# Patient Record
Sex: Male | Born: 1945 | Race: Black or African American | Hispanic: No
Health system: Southern US, Community
[De-identification: ages and names within clinical notes are randomized; demographics above are authoritative.]

## PROBLEM LIST (undated history)

## (undated) DIAGNOSIS — N289 Disorder of kidney and ureter, unspecified: Secondary | ICD-10-CM

## (undated) DIAGNOSIS — F0280 Dementia in other diseases classified elsewhere without behavioral disturbance: Secondary | ICD-10-CM

## (undated) DIAGNOSIS — I428 Other cardiomyopathies: Secondary | ICD-10-CM

## (undated) DIAGNOSIS — F319 Bipolar disorder, unspecified: Secondary | ICD-10-CM

## (undated) DIAGNOSIS — F0390 Unspecified dementia without behavioral disturbance: Secondary | ICD-10-CM

## (undated) DIAGNOSIS — I10 Essential (primary) hypertension: Secondary | ICD-10-CM

## (undated) DIAGNOSIS — R55 Syncope and collapse: Secondary | ICD-10-CM

## (undated) DIAGNOSIS — E785 Hyperlipidemia, unspecified: Secondary | ICD-10-CM

## (undated) DIAGNOSIS — N189 Chronic kidney disease, unspecified: Secondary | ICD-10-CM

## (undated) HISTORY — DX: Chronic kidney disease, unspecified: N18.9

## (undated) HISTORY — DX: Other cardiomyopathies: I42.8

## (undated) HISTORY — DX: Essential (primary) hypertension: I10

## (undated) HISTORY — DX: Bipolar disorder, unspecified: F31.9

## (undated) HISTORY — DX: Syncope and collapse: R55

## (undated) HISTORY — DX: Disorder of kidney and ureter, unspecified: N28.9

## (undated) HISTORY — DX: Hyperlipidemia, unspecified: E78.5

---

## 2004-10-05 ENCOUNTER — Emergency Department (HOSPITAL_COMMUNITY): Admission: EM | Admit: 2004-10-05 | Discharge: 2004-10-05 | Payer: Self-pay | Admitting: Emergency Medicine

## 2008-08-03 ENCOUNTER — Ambulatory Visit: Payer: Self-pay | Admitting: Internal Medicine

## 2008-08-03 ENCOUNTER — Observation Stay (HOSPITAL_COMMUNITY): Admission: EM | Admit: 2008-08-03 | Discharge: 2008-08-06 | Payer: Self-pay | Admitting: Emergency Medicine

## 2008-08-06 ENCOUNTER — Encounter (INDEPENDENT_AMBULATORY_CARE_PROVIDER_SITE_OTHER): Payer: Self-pay | Admitting: Internal Medicine

## 2008-08-31 DIAGNOSIS — I428 Other cardiomyopathies: Secondary | ICD-10-CM

## 2008-08-31 HISTORY — DX: Other cardiomyopathies: I42.8

## 2008-09-19 ENCOUNTER — Ambulatory Visit (HOSPITAL_COMMUNITY): Admission: RE | Admit: 2008-09-19 | Discharge: 2008-09-19 | Payer: Self-pay | Admitting: Cardiovascular Disease

## 2008-09-19 ENCOUNTER — Encounter (INDEPENDENT_AMBULATORY_CARE_PROVIDER_SITE_OTHER): Payer: Self-pay | Admitting: Cardiology

## 2011-01-16 NOTE — Discharge Summary (Signed)
NAME:  Eric Zamora, Eric Zamora NO.:  [id number]   MEDICAL RECORD NO.:  [id number]          PATIENT TYPE:  OBV   LOCATION:  3709                         FACILITY:  [facility]   PHYSICIAN:  Alvester Morin, M.D.  DATE OF BIRTH:  1946-03-07   DATE OF ADMISSION:  08/03/2008  DATE OF DISCHARGE:  08/06/2008                               DISCHARGE SUMMARY   DISCHARGE DIAGNOSES:  1. Presyncope, likely secondary to dehydration and ACE inhibitor.  2. Hypertension.  3. Bipolar affective disorder.  4. Hyperlipidemia.   DISCHARGE MEDICATIONS:  1. Aspirin 325 mg p.o. daily.  2. Depakote 500 mg in the morning, 500 mg at lunchtime, and 1000 mg in      the evening.  3. Zocor 20 mg p.o. daily.  4. Metoprolol 12.5 mg p.o. b.i.d.   DISPOSITION AND FOLLOWUP:  The patient is to follow up with his primary  care physician, [name]  approximately 2 weeks after discharge.  The patient also has an  appointment to follow up with Dr. [name] in Cardiology on August 20, 2008 at 10:15 a.m.   PROCEDURES PERFORMED:  1. Chest x-ray on August 03, 2008 showed mild bibasilar atelectasis.  2. Myoview on August 05, 2008 revealed decreased perfusion in the mid      inferior wall and stress imaging suspicious for inducible ischemia,      inferior wall hypokinesis with dyskinetic motion of the distal      septum, left ventricular ejection fraction of 48%.  3. A 2-D echocardiogram on August 06, 2008 revealed overall left      ventricular systolic function was normal, left ventricular function      was estimated to be 55-60%, left ventricular wall thickness was      mildly increased, left ventricular diastolic parameters were      normal.  Findings were consistent with mild aortic valve stenosis.      There was also mild-to-moderate mitral annular calcification found.   CONSULTATIONS:  Dr. [name] with Cardiology.   BRIEF ADMITTING HISTORY AND PHYSICAL:  The  patient is a 65 year old  African American male with history of hypertension, bipolar disorder who  presents following a presyncopal episode.  On the day of admission, the  patient was at a store in town with his sister sitting on a bench where  he became acutely dizzy.  He lowered his head between his legs and  experienced resolution of his symptoms approximately 2 minutes later.  He denies any loss of consciousness or prodrome.  The patient denies  headache, visual acuity changes, chest pain, palpitations, or  diaphoresis.  The patient also denies any seizure-like activity.  This  episode was unwitnessed by his sister.  The patient also denies any  bowel or bladder incontinence.  The patient states that due to the  episode was the first time he had experienced any symptoms similar to  this.   VITAL SIGNS IN LABS:  Temperature 98.6, blood pressure 89/57 which  increased to 96/63 after 500 mL of bolus, pulse 86, respirations 20,  saturation 100% on  3 liters.   PHYSICAL EXAMINATION:  GENERAL:  The patient is an elderly male in no  acute distress.  HEENT:  Eyes:  Pupils are equal, round, and reactive to light.  Extraocular movements intact.  ENT:  No trauma, moist mucous membranes.  NECK:  Supple.  No JVD.  No carotid bruits.  RESPIRATIONS:  Clear to auscultation bilaterally.  Good air movement.  No wheezes appreciated.  CARDIOVASCULAR:  Regular rate and rhythm, 2/6 systolic ejection murmur.  GI:  Abdomen is soft, nontender, nondistended, positive bowel sounds.  No organomegaly.  EXTREMITIES:  No clubbing, cyanosis, or edema.  SKIN:  No rashes or lesions.  MUSCULOSKELETAL:  No joint abnormalities.  NEUROLOGIC:  Alert and oriented x3.  Cranial nerves II through XII  intact.  Strength 5/5 bilaterally in upper and lower extremities.  Sensation is intact to light touch.  Deep tendon reflex 2+ throughout.  Toes downgoing bilaterally and coordination is intact.  PSYCHIATRY:  This patient  is somewhat difficult to understand, but he is  appropriate.   LABORATORY INFORMATION:  Sodium 146, potassium 4.7, chloride 115, bicarb  23, BUN 54, creatinine 2.2, glucose 117.  White count 4.9, hemoglobin  13.1, platelets 210.  BNP of 36, D-dimer less than 0.22.  Initial point  of care enzymes are negative.  Chest x-ray which shows mild bibasilar  atelectasis.  PT 14.0, INR 1.1, magnesium 2.3.   HOSPITAL COURSE:  1. Presyncopal episode.  The patient was admitted to the [facility] in order to be evaluated for his presyncopal      episode.  He was placed on telemetry and hydrated appropriately.      Initial orthostatic vital signs revealed the patient to be mildly      orthostatic, thus he received two 500 mL boluses and his pressure      compensated appropriately.  Upon reaching the floor, the patient      had a EKG which revealed first-degree heart block and also      questionable inferior lateral ischemia.  The primary team was      unable to obtain a prior EKG and that it was Friday afternoon and      Dr. Inez Pilgrim office was closed.  His Cardiology was      consulted.  Dr. [name] evaluated the patient and recommended a      Myoview.  In addition, Cardiology recommended holding the patient's      ACE inhibitor and hydrating him appropriately and aspirin was also      started.  The patient did well overnight and throughout his      hospitalization, did not endorse any episodes of chest pain,      diaphoresis, shortness of breath.  The patient did receive his      Myoview on August 05, 2008, which showed decreased perfusion in      the mid inferior wall and stress imaging suspicious for inducible      ischemia.  The patient's cardiac enzymes were cycled x3 and there      was no bump in his CK-MB, myoglobin, or troponin.  Cardiology did      comment that the possible inducible ischemia that was read by      Radiology and the patient's Myoview was possibly  miscalled.  At the      time of dictation, there had not been any revision of the official      read  on the Myoview and the patient is to follow up with Dr. [name]      in his clinic on August 20, 2008 at 10:15 a.m. for further      additional recommendations by Cardiology were for the patient to      increase his p.o. intake and also to discontinue his ACE inhibitor      alright.  2. Renal failure.  The patient's creatinine on admission was 2.2.      Both of the patient and his sister states that he has chronic renal      insufficiency and the primary team attempted to get the patient's      records from Dr. Inez Pilgrim office; however, the patient was      admitted on Friday afternoon and discharged on Monday and they were      unable to get these records.  As mentioned in number 1, the      patient's ACE inhibitor was held on admission and the patient's      creatinine proceeded to trend down from a high of 2.2 to a level of      1.37 at discharge.  Also Cardiology recommended that the patient      discontinue his ACE inhibitor in light of his orthostasis and soft      blood pressures, it would be up to Dr. [name] at time of      hospital followup to determine whether he would like to restart the      patient's ACE inhibitor.  3. Hypertension.  The patient maintained a soft blood pressure      throughout hospitalization with systolics averaging from roughly      100-120 and diastolic averaging 55-75 mmHg.  The patient did have      metoprolol started with parameters 12.5 mg daily.  Further      recommendations regarding the patient's beta-blocker therapy will      be discussed with Dr. [name] at the time of followup.  4. Hyperlipidemia.  As part of the patient's workup, the primary team      obtained a lipid profile, which revealed a cholesterol of 163, the      triglyceride 122, LDL level of 112, and HDL level of 27.  The      primary team proceeded to start Zocor 20  mg daily.  The patient is      being discharged on this medication.  5. Bipolar disorder.  The patient has a long history of taking      Depakote for his bipolar affective disorder.  Valproic acid level      was obtained at admission and the level was found to be therapeutic      at the level of 64.6 mcg/mL.  The primary team restarted his home      dose.  The patient did not have any episodes of mania depression      throughout his hospitalization.  He will be discharged home on his      same level of Depakote.   DISCHARGE LABS AND VITAL SIGNS:  Temperature 97.9, pulse 69, blood  pressure 102/55, respirations 18.  The patient was saturating 99% on  room air.   LABORATORY INFORMATION:  Sodium 142, potassium 4.4, chloride 114, bicarb  27, BUN 14, creatinine 1.37, glucose of 87.  The patient was discharged home in stable and improved condition.      Genia Del, MD  Electronically Signed  Alvester Morin, M.D.  Electronically Signed    ZF/MEDQ  D:  08/07/2008  T:  08/07/2008  Job:  [id number]   cc:   Cristy Hilts. [name], MD

## 2011-06-05 LAB — BASIC METABOLIC PANEL
BUN: 14 mg/dL (ref 6–23)
BUN: 36 mg/dL — ABNORMAL HIGH (ref 6–23)
CO2: 23 mEq/L (ref 19–32)
CO2: 25 mEq/L (ref 19–32)
Calcium: 8 mg/dL — ABNORMAL LOW (ref 8.4–10.5)
Chloride: 113 mEq/L — ABNORMAL HIGH (ref 96–112)
Creatinine, Ser: 1.37 mg/dL (ref 0.4–1.5)
GFR calc Af Amer: 48 mL/min — ABNORMAL LOW (ref >60)
GFR calc non Af Amer: 43 mL/min — ABNORMAL LOW (ref >60)
GFR calc non Af Amer: 52 mL/min — ABNORMAL LOW (ref >60)
Glucose, Bld: 87 mg/dL (ref 70–99)
Glucose, Bld: 87 mg/dL (ref 70–99)
Potassium: 4.8 mEq/L (ref 3.5–5.1)
Sodium: 138 mEq/L (ref 135–145)
Sodium: 142 mEq/L (ref 135–145)
Sodium: 144 mEq/L (ref 135–145)

## 2011-06-05 LAB — HEPATIC FUNCTION PANEL
Albumin: 3.8 g/dL (ref 3.5–5.2)
Bilirubin, Direct: 0.1 mg/dL (ref 0.0–0.3)
Indirect Bilirubin: 0.3 mg/dL (ref 0.3–0.9)
Total Protein: 6.9 g/dL (ref 6.0–8.3)

## 2011-06-05 LAB — URINALYSIS, ROUTINE W REFLEX MICROSCOPIC
Glucose, UA: 100 mg/dL — AB
Urobilinogen, UA: 2 mg/dL — ABNORMAL HIGH (ref 0.0–1.0)

## 2011-06-05 LAB — DIFFERENTIAL
Basophils Absolute: 0 10*3/uL (ref 0.0–0.1)
Eosinophils Absolute: 0 10*3/uL (ref 0.0–0.7)
Eosinophils Relative: 1 % (ref 0–5)
Monocytes Absolute: 0.7 10*3/uL (ref 0.1–1.0)
Neutro Abs: 2 10*3/uL (ref 1.7–7.7)

## 2011-06-05 LAB — CBC
HCT: 37.8 % — ABNORMAL LOW (ref 39.0–52.0)
MCV: 94.9 fL (ref 78.0–100.0)
Platelets: 210 10*3/uL (ref 150–400)

## 2011-06-05 LAB — DRUGS OF ABUSE SCREEN W/O ALC, ROUTINE URINE
Barbiturate Quant, Ur: NEGATIVE
Benzodiazepines.: NEGATIVE
Cocaine Metabolites: NEGATIVE
Creatinine,U: 121.9 mg/dL
Methadone: NEGATIVE
Opiate Screen, Urine: NEGATIVE
Phencyclidine (PCP): NEGATIVE

## 2011-06-05 LAB — CARDIAC PANEL(CRET KIN+CKTOT+MB+TROPI)
CK, MB: 3.5 ng/mL (ref 0.3–4.0)
Relative Index: INVALID (ref 0.0–2.5)
Relative Index: INVALID (ref 0.0–2.5)
Total CK: 110 U/L (ref 7–232)
Total CK: 95 U/L (ref 7–232)
Total CK: 95 U/L (ref 7–232)
Troponin I: 0.01 ng/mL (ref 0.00–0.06)

## 2011-06-05 LAB — HEPATITIS PANEL, ACUTE
HCV Ab: NEGATIVE
Hep A IgM: NEGATIVE

## 2011-06-05 LAB — TSH: TSH: 1.554 u[IU]/mL (ref 0.350–4.500)

## 2011-06-05 LAB — LIPID PANEL
HDL: 27 mg/dL — ABNORMAL LOW (ref >39)
Triglycerides: 122 mg/dL (ref <150)
VLDL: 24 mg/dL (ref 0–40)

## 2011-06-05 LAB — POCT I-STAT, CHEM 8
BUN: 51 mg/dL — ABNORMAL HIGH (ref 6–23)
Calcium, Ion: 1.17 mmol/L (ref 1.12–1.32)
Chloride: 115 mEq/L — ABNORMAL HIGH (ref 96–112)
HCT: 38 % — ABNORMAL LOW (ref 39.0–52.0)

## 2011-06-05 LAB — D-DIMER, QUANTITATIVE: D-Dimer, Quant: <0.22 ug/mL-FEU (ref 0.00–0.48)

## 2011-06-05 LAB — SODIUM, URINE, RANDOM: Sodium, Ur: 160 mEq/L

## 2011-06-05 LAB — POCT CARDIAC MARKERS: Myoglobin, poc: 121 ng/mL (ref 12–200)

## 2011-06-05 LAB — TROPONIN I: Troponin I: 0.01 ng/mL (ref 0.00–0.06)

## 2011-06-05 LAB — MAGNESIUM: Magnesium: 2.3 mg/dL (ref 1.5–2.5)

## 2011-06-05 LAB — CK TOTAL AND CKMB (NOT AT ARMC)
CK, MB: 3 ng/mL (ref 0.3–4.0)
Total CK: 108 U/L (ref 7–232)

## 2012-11-08 ENCOUNTER — Encounter: Payer: Self-pay | Admitting: Cardiology

## 2012-11-24 ENCOUNTER — Encounter: Payer: Self-pay | Admitting: Cardiovascular Disease

## 2013-07-02 ENCOUNTER — Emergency Department (HOSPITAL_COMMUNITY): Payer: Medicare Other

## 2013-07-02 ENCOUNTER — Encounter (HOSPITAL_COMMUNITY): Payer: Self-pay | Admitting: Emergency Medicine

## 2013-07-02 ENCOUNTER — Ambulatory Visit (HOSPITAL_COMMUNITY)
Admission: RE | Admit: 2013-07-02 | Discharge: 2013-07-02 | Disposition: A | Discharge status: Home / Self Care | Payer: Medicare Other | Source: Ambulatory Visit | Attending: Pulmonary Disease | Admitting: Pulmonary Disease

## 2013-07-02 ENCOUNTER — Inpatient Hospital Stay (HOSPITAL_COMMUNITY)
Admission: EM | Admit: 2013-07-02 | Discharge: 2013-07-10 | DRG: 871 | Disposition: A | Discharge status: Skilled Nursing Facility | Payer: Medicare Other | Attending: Internal Medicine | Admitting: Internal Medicine

## 2013-07-02 DIAGNOSIS — T839XXA Unspecified complication of genitourinary prosthetic device, implant and graft, initial encounter: Secondary | ICD-10-CM

## 2013-07-02 DIAGNOSIS — I2789 Other specified pulmonary heart diseases: Secondary | ICD-10-CM | POA: Diagnosis present

## 2013-07-02 DIAGNOSIS — I10 Essential (primary) hypertension: Secondary | ICD-10-CM | POA: Diagnosis present

## 2013-07-02 DIAGNOSIS — I5020 Unspecified systolic (congestive) heart failure: Secondary | ICD-10-CM | POA: Diagnosis present

## 2013-07-02 DIAGNOSIS — F319 Bipolar disorder, unspecified: Secondary | ICD-10-CM

## 2013-07-02 DIAGNOSIS — I2510 Atherosclerotic heart disease of native coronary artery without angina pectoris: Secondary | ICD-10-CM

## 2013-07-02 DIAGNOSIS — R7989 Other specified abnormal findings of blood chemistry: Secondary | ICD-10-CM

## 2013-07-02 DIAGNOSIS — N289 Disorder of kidney and ureter, unspecified: Secondary | ICD-10-CM

## 2013-07-02 DIAGNOSIS — G9340 Encephalopathy, unspecified: Secondary | ICD-10-CM | POA: Diagnosis present

## 2013-07-02 DIAGNOSIS — R6520 Severe sepsis without septic shock: Secondary | ICD-10-CM | POA: Diagnosis present

## 2013-07-02 DIAGNOSIS — F0390 Unspecified dementia without behavioral disturbance: Secondary | ICD-10-CM | POA: Diagnosis present

## 2013-07-02 DIAGNOSIS — I824Z9 Acute embolism and thrombosis of unspecified deep veins of unspecified distal lower extremity: Secondary | ICD-10-CM | POA: Diagnosis present

## 2013-07-02 DIAGNOSIS — E785 Hyperlipidemia, unspecified: Secondary | ICD-10-CM

## 2013-07-02 DIAGNOSIS — N189 Chronic kidney disease, unspecified: Secondary | ICD-10-CM | POA: Diagnosis present

## 2013-07-02 DIAGNOSIS — I129 Hypertensive chronic kidney disease with stage 1 through stage 4 chronic kidney disease, or unspecified chronic kidney disease: Secondary | ICD-10-CM | POA: Diagnosis present

## 2013-07-02 DIAGNOSIS — I509 Heart failure, unspecified: Secondary | ICD-10-CM | POA: Diagnosis present

## 2013-07-02 DIAGNOSIS — F312 Bipolar disorder, current episode manic severe with psychotic features: Secondary | ICD-10-CM

## 2013-07-02 DIAGNOSIS — Z66 Do not resuscitate: Secondary | ICD-10-CM | POA: Diagnosis present

## 2013-07-02 DIAGNOSIS — I421 Obstructive hypertrophic cardiomyopathy: Secondary | ICD-10-CM

## 2013-07-02 DIAGNOSIS — N179 Acute kidney failure, unspecified: Secondary | ICD-10-CM | POA: Diagnosis present

## 2013-07-02 DIAGNOSIS — I82402 Acute embolism and thrombosis of unspecified deep veins of left lower extremity: Secondary | ICD-10-CM

## 2013-07-02 DIAGNOSIS — J189 Pneumonia, unspecified organism: Secondary | ICD-10-CM

## 2013-07-02 DIAGNOSIS — E876 Hypokalemia: Secondary | ICD-10-CM

## 2013-07-02 DIAGNOSIS — A419 Sepsis, unspecified organism: Principal | ICD-10-CM

## 2013-07-02 DIAGNOSIS — K560 Paralytic ileus: Secondary | ICD-10-CM

## 2013-07-02 DIAGNOSIS — I214 Non-ST elevation (NSTEMI) myocardial infarction: Secondary | ICD-10-CM | POA: Diagnosis present

## 2013-07-02 DIAGNOSIS — R931 Abnormal findings on diagnostic imaging of heart and coronary circulation: Secondary | ICD-10-CM

## 2013-07-02 DIAGNOSIS — J9600 Acute respiratory failure, unspecified whether with hypoxia or hypercapnia: Secondary | ICD-10-CM

## 2013-07-02 DIAGNOSIS — I428 Other cardiomyopathies: Secondary | ICD-10-CM | POA: Diagnosis present

## 2013-07-02 HISTORY — DX: Unspecified dementia, unspecified severity, without behavioral disturbance, psychotic disturbance, mood disturbance, and anxiety: F03.90

## 2013-07-02 LAB — BASIC METABOLIC PANEL
BUN: 32 mg/dL — ABNORMAL HIGH (ref 6–23)
Calcium: 8.4 mg/dL (ref 8.4–10.5)
Calcium: 8.3 mg/dL — ABNORMAL LOW (ref 8.4–10.5)
Creatinine, Ser: 1.91 mg/dL — ABNORMAL HIGH (ref 0.50–1.35)
GFR calc Af Amer: 45 mL/min — ABNORMAL LOW (ref >90)
GFR calc Af Amer: 40 mL/min — ABNORMAL LOW (ref >90)
GFR calc non Af Amer: 39 mL/min — ABNORMAL LOW (ref >90)
GFR calc non Af Amer: 34 mL/min — ABNORMAL LOW (ref >90)
Glucose, Bld: 155 mg/dL — ABNORMAL HIGH (ref 70–99)
Glucose, Bld: 132 mg/dL — ABNORMAL HIGH (ref 70–99)
Potassium: 2.1 mEq/L — CL (ref 3.5–5.1)
Sodium: 142 mEq/L (ref 135–145)
Sodium: 143 mEq/L (ref 135–145)

## 2013-07-02 LAB — CBC WITH DIFFERENTIAL/PLATELET
Basophils Absolute: 0 10*3/uL (ref 0.0–0.1)
Basophils Relative: 0 % (ref 0–1)
Eosinophils Absolute: 0 10*3/uL (ref 0.0–0.7)
Hemoglobin: 13.4 g/dL (ref 13.0–17.0)
MCH: 30.4 pg (ref 26.0–34.0)
MCHC: 36.4 g/dL — ABNORMAL HIGH (ref 30.0–36.0)
Monocytes Relative: 10 % (ref 3–12)
Neutro Abs: 9.3 10*3/uL — ABNORMAL HIGH (ref 1.7–7.7)
Neutrophils Relative %: 84 % — ABNORMAL HIGH (ref 43–77)
RDW: 13.4 % (ref 11.5–15.5)

## 2013-07-02 LAB — GLUCOSE, CAPILLARY: Glucose-Capillary: 131 mg/dL — ABNORMAL HIGH (ref 70–99)

## 2013-07-02 LAB — MRSA PCR SCREENING: MRSA by PCR: NEGATIVE

## 2013-07-02 LAB — TROPONIN I: Troponin I: 6.06 ng/mL (ref <0.30)

## 2013-07-02 MED ORDER — ALBUTEROL SULFATE (5 MG/ML) 0.5% IN NEBU: 2.5000 mg | INHALATION_SOLUTION | RESPIRATORY_TRACT | Status: DC | PRN

## 2013-07-02 MED ORDER — ASPIRIN 81 MG PO CHEW
324.0000 mg | CHEWABLE_TABLET | Freq: Once | ORAL | Status: AC
  Administered 2013-07-02: 324 mg via ORAL
  Filled 2013-07-02: qty 4

## 2013-07-02 MED ORDER — METOPROLOL TARTRATE 25 MG PO TABS: 12.5000 mg | ORAL_TABLET | Freq: Two times a day (BID) | ORAL | Status: DC

## 2013-07-02 MED ORDER — ASPIRIN 81 MG PO CHEW: 81.0000 mg | CHEWABLE_TABLET | Freq: Every day | ORAL | Status: DC

## 2013-07-02 MED ORDER — PANTOPRAZOLE SODIUM 40 MG PO TBEC
40.0000 mg | DELAYED_RELEASE_TABLET | Freq: Every day | ORAL | Status: DC
  Administered 2013-07-03: 40 mg via ORAL
  Filled 2013-07-02: qty 1

## 2013-07-02 MED ORDER — DEXTROSE 5 % IV SOLN
500.0000 mg | INTRAVENOUS | Status: DC
  Administered 2013-07-02: 500 mg via INTRAVENOUS
  Filled 2013-07-02 (×2): qty 500

## 2013-07-02 MED ORDER — HEPARIN (PORCINE) IN NACL 100-0.45 UNIT/ML-% IJ SOLN
900.0000 [IU]/h | INTRAMUSCULAR | Status: DC
Start: 2013-07-02 — End: 2013-07-02
  Filled 2013-07-02: qty 250

## 2013-07-02 MED ORDER — DEXTROSE 5 % IV SOLN: 500.0000 mg | INTRAVENOUS | Status: DC

## 2013-07-02 MED ORDER — HEPARIN BOLUS VIA INFUSION
4000.0000 [IU] | Freq: Once | INTRAVENOUS | Status: AC
  Administered 2013-07-02: 4000 [IU] via INTRAVENOUS
  Filled 2013-07-02: qty 4000

## 2013-07-02 MED ORDER — HEPARIN (PORCINE) IN NACL 100-0.45 UNIT/ML-% IJ SOLN
900.0000 [IU]/h | INTRAMUSCULAR | Status: DC
  Administered 2013-07-02: 900 [IU]/h via INTRAVENOUS
  Filled 2013-07-02 (×2): qty 250

## 2013-07-02 MED ORDER — MORPHINE SULFATE 2 MG/ML IJ SOLN
1.0000 mg | Freq: Once | INTRAMUSCULAR | Status: AC
  Administered 2013-07-02: 1 mg via INTRAVENOUS
  Filled 2013-07-02: qty 1

## 2013-07-02 MED ORDER — PIPERACILLIN-TAZOBACTAM 3.375 G IVPB 30 MIN
3.3750 g | Freq: Once | INTRAVENOUS | Status: AC
  Administered 2013-07-02: 3.375 g via INTRAVENOUS
  Filled 2013-07-02: qty 50

## 2013-07-02 MED ORDER — METOPROLOL TARTRATE 12.5 MG HALF TABLET
12.5000 mg | ORAL_TABLET | Freq: Two times a day (BID) | ORAL | Status: DC
  Administered 2013-07-02 – 2013-07-04 (×5): 12.5 mg via ORAL
  Filled 2013-07-02 (×7): qty 1

## 2013-07-02 MED ORDER — POTASSIUM CHLORIDE 10 MEQ/100ML IV SOLN
10.0000 meq | INTRAVENOUS | Status: AC
Start: 2013-07-02 — End: 2013-07-02
  Administered 2013-07-02 (×4): 10 meq via INTRAVENOUS
  Filled 2013-07-02 (×2): qty 100

## 2013-07-02 MED ORDER — FUROSEMIDE 10 MG/ML IJ SOLN
40.0000 mg | Freq: Once | INTRAMUSCULAR | Status: AC
  Administered 2013-07-02: 40 mg via INTRAVENOUS
  Filled 2013-07-02: qty 4

## 2013-07-02 MED ORDER — PANTOPRAZOLE SODIUM 40 MG PO TBEC: 40.0000 mg | DELAYED_RELEASE_TABLET | Freq: Every day | ORAL | Status: DC

## 2013-07-02 MED ORDER — ASPIRIN 81 MG PO CHEW
81.0000 mg | CHEWABLE_TABLET | Freq: Every day | ORAL | Status: DC
  Administered 2013-07-03 – 2013-07-10 (×8): 81 mg via ORAL
  Filled 2013-07-02 (×7): qty 1

## 2013-07-02 MED ORDER — SODIUM CHLORIDE 0.9 % IV BOLUS (SEPSIS)
1000.0000 mL | Freq: Once | INTRAVENOUS | Status: AC
  Administered 2013-07-02: 1000 mL via INTRAVENOUS

## 2013-07-02 MED ORDER — POTASSIUM CHLORIDE 10 MEQ/100ML IV SOLN
10.0000 meq | Freq: Once | INTRAVENOUS | Status: AC
  Administered 2013-07-02: 10 meq via INTRAVENOUS
  Filled 2013-07-02: qty 100

## 2013-07-02 MED ORDER — POTASSIUM CHLORIDE CRYS ER 20 MEQ PO TBCR
40.0000 meq | EXTENDED_RELEASE_TABLET | Freq: Once | ORAL | Status: AC
  Administered 2013-07-02: 40 meq via ORAL
  Filled 2013-07-02: qty 2

## 2013-07-02 MED ORDER — HEPARIN BOLUS VIA INFUSION
4000.0000 [IU] | Freq: Once | INTRAVENOUS | Status: DC
  Filled 2013-07-02: qty 4000

## 2013-07-02 MED ORDER — POTASSIUM CHLORIDE 10 MEQ/100ML IV SOLN: 10.0000 meq | INTRAVENOUS | Status: DC

## 2013-07-02 NOTE — Consult Note (Signed)
Pt. Seen and examined. Agree with the NP/PA-C note as written.  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 [name]) and syncope in teh past. He has HTN and LVH.  He also has a history of bipolar disorder and CKD.  He now presents with fever, cough and findings compatible with possible Pneumonia versus CHF on CXR. Initial troponin is elevated at 6 with BNP >1800.  Exam suggests distended neck veins, a protuberant and tense abdomen with high-pitched bowel sounds, no clear cardiac murmur, decreased breath sounds on the left - however, he was on bipap.  I reviewed his EKG which was initially thought to be STEMI, however, there is LVH and clear lateral ST segment depressions, but elevation only in AVR and V1, therefore, not a STEMI.  Impression: 1.  NSTEMI 2.  Acute systolic congestive heart failure, unknown ejection fraction 3.  History of HOCM with a 46 mmHg peak LVOT gradient. 4.  Possible PNA versus CHF - however, there are s/s of infection. 5.  Acute renal failure 6.  Marked hypokalemia  Plan: 1.  Eric Zamora is critically ill, now requiring bipap. He appears to have had an NSTEMI, but denies chest pain.  There is possible pneumonia, but clear signs of heart failure.  I agree with diuresis and medical management of NSTEMI at this time.  Will Rx with aspirin, heparin and b-blocker. Avoid nitrates due to concern for early sepsis and history of HOCM which may be preload dependent. Check 2D echo once he is more clinically compensated. Will ultimately need cardiac catheterization after he is more medically stabilized and if he creatinine improves.  Thanks for consulting [place]. We will follow closely.  Chrystie Nose, MD, [facility] Attending Cardiologist [facility]

## 2013-07-02 NOTE — Consult Note (Signed)
Reason for Consult: Abnormal EKG, elevated Troponin  Requesting Physician: ER  HPI:           This is a 67 y.o. male with a past medical history significant for HOCM- he had a TEE Jan 2010 after a syncopal spell in Dec. 2009. He has a history of HTN with moderate LVH and a peak gradient of 46 mmHg. Nuclear Dec 2009 was initially suspicious for mid inferior ischemia, but was later read by the cardiologist and felt to show no ischemia. Other problems include CRI and Bipolar disorder. He had been followed by Dr Bascom Levels.            He was admitted to the ER today febrile with diarrhea and cough. When I examined him he was in respiratory distress, about to be put on Bi-pap. EKG shows LVH, sinus tach. His POC Troponin was negative but his second Troponin was 6. He denied chest pain to me. He last saw Dr Allyson Sabal 2012.  PMHx:  Past Medical History  Diagnosis Date  . HTN (hypertension)   . 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 Dyslipidemia   . Bipolar affective disorder   . Chronic renal insufficiency    History reviewed. No pertinent past surgical history.  FAMHx: unable to obtain due to pt factors at this time   SOCHx:  reports that he has never smoked. He does not have any smokeless tobacco history on file. He reports that he does not drink alcohol. His drug history is not on file.  ALLERGIES: No Known Allergies  ROS: Review of systems not obtained due to patient factors.  HOME MEDICATIONS:  (Not in a hospital admission)  HOSPITAL MEDICATIONS: I have reviewed the patient's current medications.  VITALS: Blood pressure 138/78, pulse 109, temperature 99.5 F (37.5 C), temperature source Oral, resp. rate 36, height 5\' 5"  (1.651 m), weight 162 lb (73.483 kg), SpO2 95.00%.  PHYSICAL EXAM: General appearance: alert, cooperative, moderate distress and diaphoretic Neck: JVD Lungs: diffuse wheezing, rales Heart: regular rate and rhythm Abdomen: protuberant,  tense, few bowel sounds Extremities: no edema Pulses: diminnished Skin: cool, diaphoretic Neurologic: Grossly normal  LABS: Results for orders placed during the hospital encounter of 07/02/13 (from the past 48 hour(s))  CBC WITH DIFFERENTIAL     Status: Abnormal   Collection Time    07/02/13 11:24 AM      Result Value Range   WBC 11.0 (*) 4.0 - 10.5 K/uL   RBC 4.41  4.22 - 5.81 MIL/uL   Hemoglobin 13.4  13.0 - 17.0 g/dL   HCT 40.9 (*) 81.1 - 91.4 %   MCV 83.4  78.0 - 100.0 fL   MCH 30.4  26.0 - 34.0 pg   MCHC 36.4 (*) 30.0 - 36.0 g/dL   RDW 78.2  95.6 - 21.3 %   Platelets 185  150 - 400 K/uL   Neutrophils Relative % 84 (*) 43 - 77 %   Neutro Abs 9.3 (*) 1.7 - 7.7 K/uL   Lymphocytes Relative 6 (*) 12 - 46 %   Lymphs Abs 0.7  0.7 - 4.0 K/uL   Monocytes Relative 10  3 - 12 %   Monocytes Absolute 1.1 (*) 0.1 - 1.0 K/uL   Eosinophils Relative 0  0 - 5 %   Eosinophils Absolute 0.0  0.0 - 0.7 K/uL   Basophils Relative 0  0 - 1 %   Basophils Absolute 0.0  0.0 - 0.1 K/uL  BASIC METABOLIC PANEL     Status: Abnormal   Collection Time    07/02/13 11:24 AM      Result Value Range   Sodium 142  135 - 145 mEq/L   Potassium 2.4 (*) 3.5 - 5.1 mEq/L   Comment: CRITICAL RESULT CALLED TO, READ BACK BY AND VERIFIED WITH:     MCKEOWNARN [id number] MCCAULEG   Chloride 107  96 - 112 mEq/L   CO2 20  19 - 32 mEq/L   Glucose, Bld 155 (*) 70 - 99 mg/dL   BUN 32 (*) 6 - 23 mg/dL   Creatinine, Ser 4.40 (*) 0.50 - 1.35 mg/dL   Calcium 8.4  8.4 - 34.7 mg/dL   GFR calc non Af Amer 39 (*) >90 mL/min   GFR calc Af Amer 45 (*) >90 mL/min   Comment: (NOTE)     The eGFR has been calculated using the CKD EPI equation.     This calculation has not been validated in all clinical situations.     eGFR's persistently <90 mL/min signify possible Chronic Kidney     Disease.  PRO B NATRIURETIC PEPTIDE     Status: Abnormal   Collection Time    07/02/13 11:25 AM      Result Value Range   Pro B Natriuretic  peptide (BNP) 18591.0 (*) 0 - 125 pg/mL  TROPONIN I     Status: Abnormal   Collection Time    07/02/13 11:25 AM      Result Value Range   Troponin I 6.06 (*) <0.30 ng/mL   Comment:            Due to the release kinetics of cTnI,     a negative result within the first hours     of the onset of symptoms does not rule out     myocardial infarction with certainty.     If myocardial infarction is still suspected,     repeat the test at appropriate intervals.     REPEATED TO VERIFY     CRITICAL RESULT CALLED TO, READ BACK BY AND VERIFIED WITH:     A.MCKEOWN,RN 1259 07/02/13 M.CAMPBELL  LACTIC ACID, PLASMA     Status: None   Collection Time    07/02/13 12:26 PM      Result Value Range   Lactic Acid, Venous 2.1  0.5 - 2.2 mmol/L  MAGNESIUM     Status: None   Collection Time    07/02/13 12:26 PM      Result Value Range   Magnesium 2.2  1.5 - 2.5 mg/dL    EKG: NSR, ST, LVH with repol  IMAGING: Dg Chest Port 1 View  07/02/2013   CLINICAL DATA:  Shortness of breath. Cough, congestion.  EXAM: PORTABLE CHEST - 1 VIEW  COMPARISON:  08/03/2008  FINDINGS: Heart size is normal. There is significant left lung infiltrate, implying the upper and lower lobes. Minimal right lower lobe atelectasis identified. No pulmonary edema.  IMPRESSION: Left upper and lower lobe infiltrates.   Electronically Signed   By: Rosalie Gums M.D.   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 He does not look like an urgent cath candidate at this time. Consider Nitrates and low dose cardio selective beta blocker.  Echo when more stable.  Time Spent Directly with  Patient: 45 minutes  Abelino Derrick [phone] beeper 07/02/2013, 2:16 PM

## 2013-07-02 NOTE — ED Notes (Signed)
Radiology at bedside

## 2013-07-02 NOTE — H&P (Signed)
PULMONARY  / CRITICAL CARE MEDICINE  Name: Eric Zamora MRN: [id number] DOB: Dec 14, 1945    ADMISSION DATE:  07/02/2013 CONSULTATION DATE:  07/02/2013  REFERRING MD :  Jodi Mourning PRIMARY SERVICE: PCCM  CHIEF COMPLAINT:  Shortness of breath  BRIEF PATIENT DESCRIPTION: 67 y/o male with dementia and HOCM presented to the [facility] ED on 11/2 with several days of dyspnea.  Noted to have an NSTEMI, severe sepsis, pneumonia vs acute CHF exacerbation, and an acute abdomen.  SIGNIFICANT EVENTS / STUDIES:  11/2 CT abdomen>  LINES / TUBES:   CULTURES: 11/2 blood >> 11/2 urine >>  ANTIBIOTICS: 11/2 zosyn >>  HISTORY OF PRESENT ILLNESS:  67 y/o male with dementia and HOCM presented to the [facility] ED on 11/2 with several days of dyspnea.  Noted to have an NSTEMI, severe sepsis, pneumonia vs acute CHF exacerbation, and an acute abdomen.  He stated that he had noted several days of dyspnea and cough.  However, his family noted that this has been going on for nearly a week and they had been trying to get him to come to the hospital but he refused.  Apparently he is a stoic person at baseline and had not noted abdominal pain despite increasing abdominal distension.  Also, he denied chest pain.    PAST MEDICAL HISTORY :  Past Medical History  Diagnosis Date  . HTN (hypertension)   . 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 Dyslipidemia   . Bipolar affective disorder   . Chronic renal insufficiency   . Dementia    History reviewed. No pertinent past surgical history. Prior to Admission medications   Medication Sig Start Date End Date Taking? Authorizing Provider  metoprolol tartrate (LOPRESSOR) 25 MG tablet Take 12.5 mg by mouth 2 (two) times daily.   Yes Historical Provider, MD  simvastatin (ZOCOR) 10 MG tablet Take 10 mg by mouth at bedtime.   Yes Historical Provider, MD   No Known Allergies  FAMILY HISTORY: SOCIAL HISTORY:REVIEW OF SYSTEMS:  Cannot obtain due to  confusion  SUBJECTIVE:   VITAL SIGNS: Temp:  [99.5 F (37.5 C)-100.3 F (37.9 C)] 99.5 F (37.5 C) (11/02 1311) Pulse Rate:  [90-115] 109 (11/02 1315) Resp:  [22-36] 36 (11/02 1315) BP: (104-139)/(56-88) 138/78 mmHg (11/02 1315) SpO2:  [81 %-95 %] 95 % (11/02 1315) Weight:  [73.483 kg (162 lb)] 73.483 kg (162 lb) (11/02 1057) HEMODYNAMICS:   VENTILATOR SETTINGS: Vent Mode:  [-] BIPAP Set Rate:  [15 bmp] 15 bmp INTAKE / OUTPUT: Intake/Output   None     PHYSICAL EXAMINATION:  Gen: chronically ill appearing, confused HEENT: NCAT, PERRL, BIPAP mask in place PULM: Wheezing bilaterally, increased WOB, crackles on left CV: RRR, systolic murmur, no JVD AB: BS infrequent, distended, tight, and diffusely tender to palpation Ext: cool, no leg edema, no clubbing, no cyanosis Derm: no rash or skin breakdown Neuro: awake, conversant , following commands, maew  LABS:  CBC Recent Labs     07/02/13  1124  WBC  11.0*  HGB  13.4  HCT  36.8*  PLT  185   Coag's No results found for this basename: APTT, INR,  in the last 72 hours BMET Recent Labs     07/02/13  1124  NA  142  K  2.4*  CL  107  CO2  20  BUN  32*  CREATININE  1.73*  GLUCOSE  155*   Electrolytes Recent Labs     07/02/13  1124  07/02/13  1226  CALCIUM  8.4   --   MG   --   2.2   Sepsis Markers No results found for this basename: LACTICACIDVEN, PROCALCITON, O2SATVEN,  in the last 72 hours ABG No results found for this basename: PHART, PCO2ART, PO2ART,  in the last 72 hours Liver Enzymes No results found for this basename: AST, ALT, ALKPHOS, BILITOT, ALBUMIN,  in the last 72 hours Cardiac Enzymes Recent Labs     07/02/13  1125  TROPONINI  6.06*  PROBNP  18591.0*   Glucose No results found for this basename: GLUCAP,  in the last 72 hours  Imaging Dg Chest Port 1 View  07/02/2013   CLINICAL DATA:  Shortness of breath. Cough, congestion.  EXAM: PORTABLE CHEST - 1 VIEW  COMPARISON:  08/03/2008   FINDINGS: Heart size is normal. There is significant left lung infiltrate, implying the upper and lower lobes. Minimal right lower lobe atelectasis identified. No pulmonary edema.  IMPRESSION: Left upper and lower lobe infiltrates.   Electronically Signed   By: Rosalie Gums M.D.   On: 07/02/2013 12:35     CXR: diffuse airspace disease on the left  ASSESSMENT / PLAN:  PULMONARY A: Acute hypoxemic respiratory failure due to pulm edema vs pneumonia P:   -BIPAP support for now -ABG now -diurese per cardiology -family does not want intubation -prn albuterol  CARDIOVASCULAR A:  NSTEMI> likely due to demand from sepsis Severe Sepsis HOCM Hypertension P:  -anticoagulation per cardiology -lasix x1 given in ED, follow response  RENAL A:  AKI ?  hypokalemia P:   -foley -monitor UOP -replete KCl  GASTROINTESTINAL A:  Abdominal distension and tenderness on exam, question ileus, SBO vs perforation P:   -non-contrast CT abdomen  HEMATOLOGIC A:  No acute issues P:  -monitor for bleeding  INFECTIOUS A:  Severe Sepsis > severe CAP vs abdominal source, see above P:   -CT abdomen -f/u cultures -zosyn, azithromycin  ENDOCRINE A:  Hyperglycemia P:   -ICU hyperglycemia protocol  NEUROLOGIC A:  Baseline dementia Baseline BiPolar disorder Acute encephalopathy > due to sepsis P:   -minimize sedating medications -supportive care  Code status: discussed with the patient's sisters including his HCPOA.  They state that he has always been resistant to medical care and do not feel that he would want life support.  Code status: limited code, no CPR, no mechanical ventilation, no shocks.  Pressors, lines OK  TODAY'S SUMMARY:   I have personally obtained a history, examined the patient, evaluated laboratory and imaging results, formulated the assessment and plan and placed orders. CRITICAL CARE: The patient is critically ill with multiple organ systems failure and requires high  complexity decision making for assessment and support, frequent evaluation and titration of therapies, application of advanced monitoring technologies and extensive interpretation of multiple databases. Critical Care Time devoted to patient care services described in this note is 60 minutes.   Fonnie Jarvis Pulmonary and Critical Care Medicine [facility] Pager: [phone]  07/02/2013, 2:24 PM

## 2013-07-02 NOTE — Progress Notes (Signed)
ANTICOAGULATION CONSULT NOTE - Initial Consult  Pharmacy Consult for Heparin  Indication: chest pain/ACS  No Known Allergies  Patient Measurements: Height: 5\' 5"  (165.1 cm) Weight: 162 lb (73.483 kg) IBW/kg (Calculated) : 61.5 Heparin Dosing Weight: 73.5 kg  Vital Signs: Temp: 99.5 F (37.5 C) (11/02 1311) Temp src: Oral (11/02 1311) BP: 138/78 mmHg (11/02 1315) Pulse Rate: 109 (11/02 1315)  Labs:  Recent Labs  07/02/13 1124 07/02/13 1125  HGB 13.4  --   HCT 36.8*  --   PLT 185  --   CREATININE 1.73*  --   TROPONINI  --  6.06*    Estimated Creatinine Clearance: 35.5 ml/min (by C-G formula based on Cr of 1.73).   Medical History: Past Medical History  Diagnosis Date  . HTN (hypertension)   . 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 Dyslipidemia   . Bipolar affective disorder   . Chronic renal insufficiency   . Dementia     Assessment: 67 y.o. M with cardiac history significant for HOCM who presented to the [facility] on 11/2 with fevers, diarrhea, and cough and was noted to have an elevated troponin. Pharmacy was consulted to start heparin for NSTEMI while awaiting further cardiology work-up -- to get cardiac cath when more stable.   The patient was on no anticoagulants PTA and has no recent surgeries or bleeding noted. Hep Wt: 73.5 kg, baseline CBC okay.   Goal of Therapy:  Heparin level 0.3-0.7 units/ml Monitor platelets by anticoagulation protocol: Yes   Plan:  1. Heparin bolus of 4000 units x 1 2. Initiate heparin drip at a rate of 900 units/hr (9 ml/hr) 3. Daily heparin levels 4. Will continue to monitor for any signs/symptoms of bleeding and will follow up with heparin level in 6 hours   Georgina Pillion, PharmD, BCPS Clinical Pharmacist Pager: [phone] 07/02/2013 3:01 PM

## 2013-07-02 NOTE — ED Notes (Signed)
Family reports URI and diarrhrea started on 06-30-13. Pt unable to reporte how often the diarrhea occurs per day. On arrival to ED pt temp 100.3  Oral. Pt cough congested and clear mucous from nose.

## 2013-07-02 NOTE — ED Provider Notes (Addendum)
CSN: [id number]     Arrival date & time 07/02/13  1018 History  This chart was scribed for Eric Skeens, MD by Quintella Reichert, ED scribe.  This patient was seen in room B14C/B14C and the patient's care was started at 10:44 AM.   Chief Complaint  Patient presents with  . URI  . Fever  . Diarrhea    The history is provided by the patient and a relative. No language interpreter was used.    HPI Comments: Eric Zamora is a 67 y.o. male with h/o obstructive cardiomyopathy, HTN, dyslipidemia, and chronic renal insufficiency who presents to the Emergency Department complaining of 2 days of persistent worsening cough and diarrhea.  Cough is non-productive per pt.  Family also notes associated congestion and clear mucous from the nose.  On arrival pt is also mildly febrile to 100.3 F.  Pt is unable to report how often the diarrhea occurs per day.  He denies CP, SOB, abdominal pain, emesis, headache, visual change, neck pain, or leg swelling.  Pt is not on oxygen at home.  He denies recent antibiotics, hospitalization, or travel out[place]ode of the country.  He denies allergies to antibiotics.   Past Medical History  Diagnosis Date  . HTN (hypertension)   . 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 Dyslipidemia   . Bipolar affective disorder   . Chronic renal insufficiency     History reviewed. No pertinent past surgical history.  No family history on file.   History  Substance Use Topics  . Smoking status: Never Smoker   . Smokeless tobacco: Not on file  . Alcohol Use: No      Review of Systems A complete 10 system review of systems was obtained and all systems are negative except as noted in the HPI and PMH.    Allergies  Review of patient's allergies indicates no known allergies.  Home Medications   Current Outpatient Rx  Name  Route  Sig  Dispense  Refill  . divalproex (DEPAKOTE) 500 MG DR tablet   Oral   Take 1,000 mg by mouth 2 (two) times  daily.         . metoprolol tartrate (LOPRESSOR) 25 MG tablet   Oral   Take 12.5 mg by mouth 2 (two) times daily.          BP 128/88  Pulse 110  Temp(Src) 100.3 F (37.9 C) (Oral)  Resp 22  SpO2 81%  Physical Exam  Nursing note and vitals reviewed. Constitutional: He is oriented to person, place, and time. He appears well-developed and well-nourished. No distress.  HENT:  Head: Normocephalic and atraumatic.  Mouth/Throat: Mucous membranes are dry.  Eyes: EOM are normal.  Chronic-appearing sclera  Neck: Neck supple. No tracheal deviation present.  Cardiovascular: Tachycardia present.   Mild tachycardia Equal pulses bilaterally   Pulmonary/Chest: Tachypnea noted. No respiratory distress. He has rales.  Coarse breath sounds bilaterally with rales Pt on [place] oxygen 3-4 L  Abdominal: Soft. He exhibits distension (mild). There is no tenderness.  Musculoskeletal: Normal range of motion.  Mild swelling to bilateral lower extremities  Neurological: He is alert and oriented to person, place, and time.  Skin: Skin is warm and dry.  Psychiatric: He has a normal mood and affect. His behavior is normal.    ED Course  Procedures (including critical care time) CRITICAL CARE Performed by: Eric Zamora   Total critical care time: 40 min  Critical care time was exclusive of separately billable procedures and treating other patients.  Critical care was necessary to treat or prevent imminent or life-threatening deterioration.  Critical care was time spent personally by me on the following activities: development of treatment plan with patient and/or surrogate as well as nursing, discussions with consultants, evaluation of patient's response to treatment, examination of patient, obtaining history from patient or surrogate, ordering and performing treatments and interventions, ordering and review of laboratory studies, ordering and review of radiographic studies, pulse oximetry and  re-evaluation of patient's condition.   DIAGNOSTIC STUDIES: Oxygen Saturation is 81% on [place], low by my interpretation.    COORDINATION OF CARE: 10:48 AM-Discussed treatment plan which includes bloodwork, CXR, antibiotics, and likely admission with pt at bedside and pt agreed to plan.    Labs Review Labs Reviewed  CBC WITH DIFFERENTIAL - Abnormal; Notable for the following:    WBC 11.0 (*)    HCT 36.8 (*)    MCHC 36.4 (*)    Neutrophils Relative % 84 (*)    Neutro Abs 9.3 (*)    Lymphocytes Relative 6 (*)    Monocytes Absolute 1.1 (*)    All other components within normal limits  BASIC METABOLIC PANEL - Abnormal; Notable for the following:    Potassium 2.4 (*)    Glucose, Bld 155 (*)    BUN 32 (*)    Creatinine, Ser 1.73 (*)    GFR calc non Af Amer 39 (*)    GFR calc Af Amer 45 (*)    All other components within normal limits  PRO B NATRIURETIC PEPTIDE - Abnormal; Notable for the following:    Pro B Natriuretic peptide (BNP) 18591.0 (*)    All other components within normal limits  TROPONIN I - Abnormal; Notable for the following:    Troponin I 6.06 (*)    All other components within normal limits  CULTURE, BLOOD (ROUTINE X 2)  CULTURE, BLOOD (ROUTINE X 2)  URINE CULTURE  LACTIC ACID, PLASMA  MAGNESIUM  BASIC METABOLIC PANEL  HEPARIN LEVEL (UNFRACTIONATED)    Imaging Review No results found.  EKG Interpretation     Ventricular Rate:  115 PR Interval:  147 QRS Duration: 98 QT Interval:  434 QTC Calculation: 600 R Axis:   47 Text Interpretation:  Ectopic atrial tachycardia, unifocal LVH with secondary repolarization abnormality ST depression, consider ischemia, diffuse lds Anterior ST elevation, probably due to LVH Prolonged QT interval ED PHYSICIAN INTERPRETATION AVAILABLE IN [facility]           Repeat EKG not in MUSE  Date: 07/04/2013  Rate: 99  Rhythm: sinus tachycardia  QRS Axis: left  Intervals: QT prolonged  ST/T Wave abnormalities: ST  elevations anteriorly, ST depressions inferiorly and ST depressions laterally  Conduction Disutrbances:left bundle branch block  Narrative Interpretation:   Old EKG Reviewed: unchanged   MDM  No diagnosis found. I personally performed the services described in this documentation, which was scribed in my presence. The recorded information has been reviewed and is accurate.  Patient presents as sepsis with underlying cardiomyopathy. EKG showed worsening inf. Lateral depressions with isolated STE in one lead.   Discussed with cardiology, he recommended no code STEMI at this time, recommended stabilizing in ICU and will discuss cath when he Sees the patient. ASA given in ED.    Pt working to breath on 4 L [place], worsened on recheck.  CXR reviewed, bilateral infiltrates, cultures/ zosyn given.   Rechecked patient  multiple times.  Discussed severity with family.  Discussed with ICU attending, agreed with ICU, evaluated.   The patients results and plan were reviewed and discussed.   Any x-rays performed were personally reviewed by myself.   Differential diagnosis were considered with the presenting HPI.  Diagnosis: Acute CHF, Pneumonia, Sepsis, NSTEMI, Troponin elevation, Dyspnea, QT prologation  EKG: lateral and inf depression/ T wave inversions, mild worse to previous  Admission/ observation were discussed with the admitting physician, patient and/or family and they are comfortable with the plan.     Eric Skeens, MD 07/04/13 1738  Eric Skeens, MD 07/04/13 [phone]

## 2013-07-03 DIAGNOSIS — I059 Rheumatic mitral valve disease, unspecified: Secondary | ICD-10-CM

## 2013-07-03 DIAGNOSIS — I82402 Acute embolism and thrombosis of unspecified deep veins of left lower extremity: Secondary | ICD-10-CM | POA: Diagnosis present

## 2013-07-03 DIAGNOSIS — M7989 Other specified soft tissue disorders: Secondary | ICD-10-CM

## 2013-07-03 DIAGNOSIS — K560 Paralytic ileus: Secondary | ICD-10-CM | POA: Diagnosis present

## 2013-07-03 LAB — BASIC METABOLIC PANEL
BUN: 31 mg/dL — ABNORMAL HIGH (ref 6–23)
BUN: 31 mg/dL — ABNORMAL HIGH (ref 6–23)
Calcium: 8 mg/dL — ABNORMAL LOW (ref 8.4–10.5)
Calcium: 8.2 mg/dL — ABNORMAL LOW (ref 8.4–10.5)
Chloride: 107 mEq/L (ref 96–112)
Chloride: 110 mEq/L (ref 96–112)
Creatinine, Ser: 1.64 mg/dL — ABNORMAL HIGH (ref 0.50–1.35)
Creatinine, Ser: 1.48 mg/dL — ABNORMAL HIGH (ref 0.50–1.35)
GFR calc Af Amer: 48 mL/min — ABNORMAL LOW (ref >90)
GFR calc Af Amer: 54 mL/min — ABNORMAL LOW (ref >90)
Glucose, Bld: 111 mg/dL — ABNORMAL HIGH (ref 70–99)
Sodium: 142 mEq/L (ref 135–145)

## 2013-07-03 LAB — HEPARIN LEVEL (UNFRACTIONATED)
Heparin Unfractionated: 0.17 IU/mL — ABNORMAL LOW (ref 0.30–0.70)
Heparin Unfractionated: 0.31 IU/mL (ref 0.30–0.70)
Heparin Unfractionated: 0.36 IU/mL (ref 0.30–0.70)

## 2013-07-03 LAB — URINE CULTURE: Special Requests: NORMAL

## 2013-07-03 LAB — TROPONIN I: Troponin I: 4.53 ng/mL (ref <0.30)

## 2013-07-03 LAB — CLOSTRIDIUM DIFFICILE BY PCR: Toxigenic C. Difficile by PCR: NEGATIVE

## 2013-07-03 MED ORDER — FAMOTIDINE 20 MG PO TABS
20.0000 mg | ORAL_TABLET | Freq: Two times a day (BID) | ORAL | Status: DC
  Administered 2013-07-03 – 2013-07-10 (×14): 20 mg via ORAL
  Filled 2013-07-03 (×16): qty 1

## 2013-07-03 MED ORDER — INFLUENZA VAC SPLIT QUAD 0.5 ML IM SUSP
0.5000 mL | INTRAMUSCULAR | Status: AC
  Administered 2013-07-04: 0.5 mL via INTRAMUSCULAR
  Filled 2013-07-03: qty 0.5

## 2013-07-03 MED ORDER — HEPARIN (PORCINE) IN NACL 100-0.45 UNIT/ML-% IJ SOLN
1250.0000 [IU]/h | INTRAMUSCULAR | Status: DC
  Administered 2013-07-03: 1150 [IU]/h via INTRAVENOUS
  Administered 2013-07-04 – 2013-07-06 (×3): 1250 [IU]/h via INTRAVENOUS
  Filled 2013-07-03 (×8): qty 250

## 2013-07-03 MED ORDER — LOPERAMIDE HCL 2 MG PO CAPS
2.0000 mg | ORAL_CAPSULE | ORAL | Status: DC | PRN
  Filled 2013-07-03: qty 1

## 2013-07-03 MED ORDER — POTASSIUM CHLORIDE CRYS ER 20 MEQ PO TBCR
40.0000 meq | EXTENDED_RELEASE_TABLET | Freq: Once | ORAL | Status: AC
  Administered 2013-07-03: 40 meq via ORAL
  Filled 2013-07-03: qty 2

## 2013-07-03 MED ORDER — POTASSIUM CHLORIDE 10 MEQ/100ML IV SOLN
10.0000 meq | INTRAVENOUS | Status: AC
  Administered 2013-07-03 (×6): 10 meq via INTRAVENOUS
  Filled 2013-07-03: qty 100

## 2013-07-03 MED ORDER — BIOTENE DRY MOUTH MT LIQD
15.0000 mL | Freq: Two times a day (BID) | OROMUCOSAL | Status: DC
  Administered 2013-07-03 – 2013-07-10 (×12): 15 mL via OROMUCOSAL

## 2013-07-03 MED ORDER — HEPARIN BOLUS VIA INFUSION
2000.0000 [IU] | Freq: Once | INTRAVENOUS | Status: AC
  Administered 2013-07-03: 2000 [IU] via INTRAVENOUS
  Filled 2013-07-03: qty 2000

## 2013-07-03 MED ORDER — PNEUMOCOCCAL VAC POLYVALENT 25 MCG/0.5ML IJ INJ
0.5000 mL | INJECTION | INTRAMUSCULAR | Status: AC
  Administered 2013-07-04: 0.5 mL via INTRAMUSCULAR
  Filled 2013-07-03: qty 0.5

## 2013-07-03 MED ORDER — PIPERACILLIN-TAZOBACTAM 3.375 G IVPB
3.3750 g | Freq: Three times a day (TID) | INTRAVENOUS | Status: DC
  Administered 2013-07-03 – 2013-07-07 (×13): 3.375 g via INTRAVENOUS
  Filled 2013-07-03 (×16): qty 50

## 2013-07-03 NOTE — Evaluation (Signed)
Physical Therapy Evaluation Patient Details Name: Eric Zamora MRN: [id number] DOB: February 22, 1946 Today's Date: 07/03/2013 Time: 3474-2595 PT Time Calculation (min): 34 min  PT Assessment / Plan / Recommendation History of Present Illness  67 y/o male with dementia and HOCM presented to the [facility] ED on 11/2 with several days of dyspnea. Noted to have an NSTEMI, severe sepsis, pneumonia vs acute CHF exacerbation, and an acute abdomen.  Clinical Impression  Pt is a very pleasant gentleman who was incontinent of stool throughout session and states he was aware but couldn't call fast enough even with staff in room and did not make staff aware after the fact. Pt with history of dementia and family not present to confirm cognition or PLOF. Pt mobility currently limited by incontinence and demonstrated decreased safety and strength. Pt will benefit from acute therapy to maximize mobility and function to decrease burden of care.     PT Assessment  Patient needs continued PT services    Follow Up Recommendations  Supervision/Assistance - 24 hour;Home health PT    Does the patient have the potential to tolerate intense rehabilitation      Barriers to Discharge Other (comment) unsure how much supervision is available at DC    Equipment Recommendations  None recommended by PT    Recommendations for Other Services     Frequency Min 3X/week    Precautions / Restrictions Precautions Precautions: Fall Precaution Comments: incontinent of stool   Pertinent Vitals/Pain No pain sats 97-100% on RA HR 76      Mobility  Bed Mobility Bed Mobility: Rolling Right;Right Sidelying to Sit;Sitting - Scoot to Edge of Bed Rolling Right: 5: Supervision;With rail Right Sidelying to Sit: 5: Supervision Sitting - Scoot to Edge of Bed: 5: Supervision Details for Bed Mobility Assistance: cueing for lines and safety but no physical assist. Pt incontinent of liquid stool on arrival and Rn notified with assist  for pericare supine prior to OOB Transfers Transfers: Sit to Stand;Stand to Sit;Stand Pivot Transfers Sit to Stand: 5: Supervision;From bed;From chair/3-in-1 Stand to Sit: 5: Supervision;To chair/3-in-1;With armrests Stand Pivot Transfers: 5: Supervision Details for Transfer Assistance: pt stood from bed and pivoted to chair with cueing for hand placement and assist to manage lines. Pt stood from chair and was incontinent of stool again, performed pericare then pivoted to [facility], pt able to void some and pivoted back to chair Ambulation/Gait Ambulation/Gait Assistance: Not tested (comment)    Exercises     PT Diagnosis: Altered mental status;Difficulty walking  PT Problem List: Decreased activity tolerance;Decreased mobility;Decreased cognition PT Treatment Interventions: Gait training;Functional mobility training;Therapeutic activities;Therapeutic exercise;Patient/family education;Cognitive remediation     PT Goals(Current goals can be found in the care plan section) Acute Rehab PT Goals Patient Stated Goal: return home PT Goal Formulation: With patient Time For Goal Achievement: 07/17/13 Potential to Achieve Goals: Good  Visit Information  Last PT Received On: 07/03/13 Assistance Needed: +1 History of Present Illness: 67 y/o male with dementia and HOCM presented to the [facility] ED on 11/2 with several days of dyspnea. Noted to have an NSTEMI, severe sepsis, pneumonia vs acute CHF exacerbation, and an acute abdomen.       Prior Functioning  Home Living Family/patient expects to be discharged to:: Private residence Living Arrangements: Other relatives Available Help at Discharge: Family;Available PRN/intermittently Type of Home: House Home Layout: One level Home Equipment: None Prior Function Level of Independence: Needs assistance ADL's / Homemaking Assistance Needed: pt states sister sets up his bath  then he bathes and dresses on his own Comments: pt reports he lives with sister  and that a free bus takes him to [employer] during the day and the manager there will get a Zenaida Niece so he can go to [employer]. Pt states he walks and moves without assist. Family not present to confirm PLOF. Communication Communication: Expressive difficulties (pt with mumbled speech and difficult to understand)    Cognition  Cognition Arousal/Alertness: Awake/alert Behavior During Therapy: Flat affect Overall Cognitive Status: No family/caregiver present to determine baseline cognitive functioning    Extremity/Trunk Assessment Upper Extremity Assessment Upper Extremity Assessment: Generalized weakness Lower Extremity Assessment Lower Extremity Assessment: Generalized weakness Cervical / Trunk Assessment Cervical / Trunk Assessment: Normal   Balance    End of Session PT - End of Session Activity Tolerance: Patient tolerated treatment well Patient left: in chair;with call bell/phone within reach Nurse Communication: Mobility status  GP     Delorse Lek 07/03/2013, 2:34 PM Delaney Meigs, PT [phone]

## 2013-07-03 NOTE — Progress Notes (Signed)
VASCULAR LAB PRELIMINARY  PRELIMINARY  PRELIMINARY  PRELIMINARY  Bilateral lower extremity venous duplex  completed.    Preliminary report:  Right:  DVT noted in the peroneal vein.  No evidence of superficial thrombosis.  No Baker's cyst.  Left:  No evidence of DVT, superficial thrombosis, or Baker's cyst.  Fatema Rabe, RVT 07/03/2013, 4:41 PM

## 2013-07-03 NOTE — Progress Notes (Signed)
   Subjective:  Awkae and alert, less SOB, abdominal pain better.  Objective:  Vital Signs in the last 24 hours: Temp:  [97.6 F (36.4 C)-99.5 F (37.5 C)] 98.3 F (36.8 C) (11/03 0754) Pulse Rate:  [74-115] 74 (11/03 1000) Resp:  [17-39] 17 (11/03 1000) BP: (98-144)/(56-88) 125/75 mmHg (11/03 1000) SpO2:  [88 %-100 %] 100 % (11/03 1000) FiO2 (%):  [40 %] 40 % (11/03 0400) Weight:  [140 lb 14 oz (63.9 kg)-162 lb (73.483 kg)] 140 lb 14 oz (63.9 kg) (11/03 0500)  Intake/Output from previous day:  Intake/Output Summary (Last 24 hours) at 07/03/13 1042 Last data filed at 07/03/13 1000  Gross per 24 hour  Intake   1394 ml  Output   1870 ml  Net   -476 ml   Physical Exam: General appearance: alert, cooperative and no distress Lungs: decreased breath sounds Heart: regular rate and rhythm and 2/6 systolic murmur Abdomen: protuberant, tympanic, high pitched bnowel sounds - less tense Ext: no c/c/e Neuro: grossly normal   Rate: 74  Rhythm: normal sinus rhythm  Lab Results:  Recent Labs  07/02/13 1124  WBC 11.0*  HGB 13.4  PLT 185    Recent Labs  07/02/13 1839 07/03/13 0020  NA 143 141  K 2.1* 2.1*  CL 107 107  CO2 21 21  GLUCOSE 132* 111*  BUN 33* 31*  CREATININE 1.91* 1.64*    Recent Labs  07/02/13 1125  TROPONINI 6.06*   No results found for this basename: INR,  in the last 72 hours  Imaging: Imaging results have been reviewed  Cardiac Studies:  Assessment/Plan:   Principal Problem:   Acute respiratory failure Active Problems:   NSTEMI (non-ST elevated myocardial infarction)   Severe sepsis   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 Follow up Troponin. Eventually consider cath vs risk stratify with Myoview.  Corine Shelter PA-C Beeper [phone] 07/03/2013, 10:42  AM  I have seen and evaluated the patient this PM along with Corine Shelter, PA. I agree with his findings, examination as well as impression recommendations.  I reviewed the Echo (bedside) - no obvious WMA, significant basal septal hypertrophy with LVOT gradient. Grade 3 Diastolic Dysfunction with elevated LVEDP.  No active SSx of MI / CHF -- resting comfortably.  No c/o SOB since being off BiPAP Abdomen still protuberant - resolving ileus. Renal function improving.  Troponin trending down.  Will look for Echo to read, but in the absence of gross WMA, we have the leeway to continue Medical Rx for what may well be Type 2 MI (no ECG changes, Echo WMA, or Sx to go along with Type 1 MI).  Heparin for total ~72 hrs.  BB (when able, would consider statin).   Will need to follow over the next few days to consider Cath vs. Myoview.  Will follow along.  Marykay Lex, M.D., M.S. [facility] [address] [place], [place]  [address]  [phone] Pager # [phone] 07/03/2013 4:03 PM

## 2013-07-03 NOTE — Progress Notes (Signed)
UR Completed.  Eric Zamora Jane [phone] 07/03/2013  

## 2013-07-03 NOTE — Progress Notes (Signed)
Transferred to unit from 2100 with nurse and Diplomatic Services operational officer. Tele placed on patient, oriented patient to room and unit. Call bell within reach. Voiced no complaints of pain. Will continue to monitor.

## 2013-07-03 NOTE — Progress Notes (Signed)
eLink Physician-Brief Progress Note Patient Name: Eric Zamora DOB: 11/14/1945 MRN: [id number]  Date of Service  07/03/2013   HPI/Events of Note  Hypokalemia at 2.1 mEq/L   eICU Interventions  Plan: Replaced with 40 mEq po and 60 mEq IV   Intervention Category Intermediate Interventions: Electrolyte abnormality - evaluation and management  Zyren Sevigny 07/03/2013, 1:18 AM

## 2013-07-03 NOTE — Progress Notes (Signed)
eLink Physician-Brief Progress Note Patient Name: Eric Zamora DOB: February 14, 1946 MRN: [id number]  Date of Service  07/03/2013   HPI/Events of Note   Clot below knee   eICU Interventions  Continue heparin already on board   Intervention Category Intermediate Interventions: Communication with other healthcare providers and/or family  Nelda Bucks. 07/03/2013, 5:34 PM

## 2013-07-03 NOTE — Progress Notes (Signed)
ANTIBIOTIC CONSULT NOTE - INITIAL  Pharmacy Consult for Zosyn Indication: rule out sepsis  No Known Allergies Patient Measurements: Height: 5\' 5"  (165.1 cm) Weight: 140 lb 14 oz (63.9 kg) IBW/kg (Calculated) : 61.5 Vital Signs: Temp: 98.3 F (36.8 C) (11/03 0754) Temp src: Oral (11/03 0754) BP: 119/75 mmHg (11/03 0800) Pulse Rate: 83 (11/03 0800) Intake/Output from previous day: 11/02 0701 - 11/03 0700 In: 1367 [I.V.:117; IV Piggyback:1250] Out: 1870 [Urine:1870] Intake/Output from this shift: Total I/O In: 9 [I.V.:9] Out: -   Labs:  Recent Labs  07/02/13 1124 07/02/13 1839 07/03/13 0020  WBC 11.0*  --   --   HGB 13.4  --   --   PLT 185  --   --   CREATININE 1.73* 1.91* 1.64*   Estimated Creatinine Clearance: 37.5 ml/min (by C-G formula based on Cr of 1.64).  Microbiology: Recent Results (from the past 720 hour(s))  MRSA PCR SCREENING     Status: None   Collection Time    07/02/13  3:47 PM      Result Value Range Status   MRSA by PCR NEGATIVE  NEGATIVE Final   Comment:            The GeneXpert MRSA Assay (FDA     approved for NASAL specimens     only), is one component of a     comprehensive MRSA colonization     surveillance program. It is not     intended to diagnose MRSA     infection nor to guide or     monitor treatment for     MRSA infections.  CULTURE, BLOOD (ROUTINE X 2)     Status: None   Collection Time    07/02/13  4:33 PM      Result Value Range Status   Specimen Description BLOOD LEFT ARM   Final   Special Requests BOTTLES DRAWN AEROBIC ONLY 3CC   Final   Culture  Setup Time     Final   Value: 07/02/2013 21:48     Performed at [facility]   Culture     Final   Value:        BLOOD CULTURE RECEIVED NO GROWTH TO DATE CULTURE WILL BE HELD FOR 5 DAYS BEFORE ISSUING A FINAL NEGATIVE REPORT     Performed at [facility]   Report Status PENDING   Incomplete  CULTURE, BLOOD (ROUTINE X 2)     Status: None   Collection Time   07/02/13  4:38 PM      Result Value Range Status   Specimen Description BLOOD RIGHT ANTECUBITAL   Final   Special Requests BOTTLES DRAWN AEROBIC AND ANAEROBIC 10CC   Final   Culture  Setup Time     Final   Value: 07/02/2013 21:48     Performed at [facility]   Culture     Final   Value:        BLOOD CULTURE RECEIVED NO GROWTH TO DATE CULTURE WILL BE HELD FOR 5 DAYS BEFORE ISSUING A FINAL NEGATIVE REPORT     Performed at [facility]   Report Status PENDING   Incomplete    Medical History: Past Medical History  Diagnosis Date  . HTN (hypertension)   . 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 Dyslipidemia   . Bipolar affective disorder   . Chronic renal insufficiency   . Dementia    Assessment: 85  YOM with sepsis (r/o pneumonia) on Azithromycin and s/p 1 dose of Zosyn on 07/02/13 at 1400. To continue Zosyn for empiric coverage. WBC 11. SCr 1.64/estCrCl~ 37.33mL/min. Patient is making urine. Tmax 100.3.  Goal of Therapy:  Clinical resolution of infection  Plan:  1. Zosyn 3.375g IV q8h- 4 hr infusion. 2. Follow-up cultures, renal function, and clinical status.  4. MD - Please note that patient is on Azithromycin and QTc is prolonged at 600. Patient does have HOCM. No ICD documented.   Link Snuffer, PharmD, BCPS Clinical Pharmacist [phone] 07/03/2013,8:39 AM

## 2013-07-03 NOTE — Progress Notes (Signed)
PULMONARY  / CRITICAL CARE MEDICINE  Name: Eric Zamora MRN: [id number] DOB: 12/10/1945    ADMISSION DATE:  07/02/2013 CONSULTATION DATE:  07/02/2013  REFERRING MD :  Jodi Mourning PRIMARY SERVICE: PCCM  CHIEF COMPLAINT:  Shortness of breath  BRIEF PATIENT DESCRIPTION: 67 y/o male with dementia and HOCM presented to the [facility] ED on 11/2 with several days of dyspnea. Noted to have an NSTEMI, severe sepsis, pneumonia vs acute CHF exacerbation, and an acute abdomen.   SIGNIFICANT EVENTS / STUDIES:  11/2: CT abdomen>pneumonia v atelectasis, dilated bowel loops. Started BiPAP 11/3: To O2 by [place]. D/c azithro due to prolonged QTc and hypokalemia. LE duplex due to LLE swelling.   LINES / TUBES: 11/1: Foley catheter >  CULTURES: 11/2 blood>> 11.2 urine>>   ANTIBIOTICS: 11/2 zosyn >> 11/2 azithromycin >>11/3 d/c azithromycin  HISTORY OF PRESENT ILLNESS:  67 y/o male with dementia and HOCM presented to [facility] 11/2 with several days of dyspnea. Noted to have NSTEMI, severe sepsis, pneumonia vs acute CHF exacerbation and acute abdomen. Stated that he had noted several days of dyspnea and cough. Family noted this has been going on ~1 week but pt had refused hospitalization. He is apparently stoic at baseline and had not noticed abdominal pain despite increasing distension. Also denied chest pain.  PAST MEDICAL HISTORY :  Past Medical History  Diagnosis Date  . HTN (hypertension)   . 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 Dyslipidemia   . Bipolar affective disorder   . Chronic renal insufficiency   . Dementia    History reviewed. No pertinent past surgical history. Prior to Admission medications   Medication Sig Start Date End Date Taking? Authorizing Provider  metoprolol tartrate (LOPRESSOR) 25 MG tablet Take 12.5 mg by mouth 2 (two) times daily.   Yes Historical Provider, MD  simvastatin (ZOCOR) 10 MG tablet Take 10 mg by mouth at bedtime.   Yes Historical Provider,  MD   No Known Allergies  FAMILY HISTORY:  No family history on file. SOCIAL HISTORY:  reports that he has never smoked. He does not have any smokeless tobacco history on file. He reports that he does not drink alcohol. His drug history is not on file.  REVIEW OF SYSTEMS:  Reports diarrhea since arrival. Reports abdominal distension but much softer than previously.  SUBJECTIVE: Feels improved today with cough and abdominal distension but reports it is less firm than previously.  VITAL SIGNS: Temp:  [97.6 F (36.4 C)-100.3 F (37.9 C)] 98.3 F (36.8 C) (11/03 0754) Pulse Rate:  [74-115] 74 (11/03 1000) Resp:  [17-39] 17 (11/03 1000) BP: (98-144)/(56-88) 125/75 mmHg (11/03 1000) SpO2:  [81 %-100 %] 100 % (11/03 1000) FiO2 (%):  [40 %] 40 % (11/03 0400) Weight:  [140 lb 14 oz (63.9 kg)-162 lb (73.483 kg)] 140 lb 14 oz (63.9 kg) (11/03 0500) HEMODYNAMICS:   VENTILATOR SETTINGS: Vent Mode:  [-] BIPAP FiO2 (%):  [40 %] 40 % Set Rate:  [15 bmp] 15 bmp INTAKE / OUTPUT: Intake/Output     11/02 0701 - 11/03 0700 11/03 0701 - 11/04 0700   I.V. (mL/kg) 117 (1.8) 27 (0.4)   IV Piggyback 1250    Total Intake(mL/kg) 1367 (21.4) 27 (0.4)   Urine (mL/kg/hr) 1870    Total Output 1870     Net -503 +27          PHYSICAL EXAMINATION: General:  NAD, pleasant Neuro:  Awake, alert, mild dysarthria HEENT:  AT/[place], sclera clear, EOMI Cardiovascular:  RRR per 2+ DP pulse Lungs:  Normal effort, occasional cough, decreased air movement left lung Abdomen:  Soft, distended, nontender, bowel sounds present Musculoskeletal:  2+ left LE edema to mid-calf, 1+ right lower extremity edema Skin:  No rash or cyanosis GU: Foley catheter in place draining clear yellow urine  LABS: Reviewed and noted in assessment and plan.   CXR: diffuse left airspace disease; ?consol vs pna  ASSESSMENT / PLAN:  PULMONARY A: Acute hypoxemic resp failure due to pneumonia vs pulmonary edema, pna more likely given  unilateral. BNP 18,500. P:   - Started on BiPAP, now on O2 by [place] - Family does not want intubation - PRN albuterol - Zosyn and azithro - 11/3 d/c azithro with prolonged QTc, more likely aspiration pneumonia - CXR tomorrow  CARDIOVASCULAR A:  NSTEMI likely from demand from sepsis, Trop 6 Severe sepsis HOCM HTN P:  - Anticoagulation per cardiology, on heparin drip - LE edema L>R on exam - ordering LE duplex to evaluate. - lasix x 1 given in ED  RENAL A:   AKI?  Hypokalemia to 2.1 P:   - foley - Monitoring UOP - Repleting KCl, improved to 3.4, check BMET in AM   GASTROINTESTINAL A:  Abdominal distension and tenderness on exam, question ileus, SBO vs Perforation P:   - CT abdomen with dilated bowel, most likely adynamic ileus - Pt reporting flatus and having some diarrhea yesterday and today - C diff by PCR ordered - Serial abdominal exam and KUB tomorrow  HEMATOLOGIC A:  No acute distress P:  - Monitor for bleed  INFECTIOUS A:  Severe sepsis>severe CAP  P:   - f/u cultures - zosyn 11/2> - 11/3 d/c azithro due to prolonged QTc  ENDOCRINE A:  Hyperglycemia   P:   - ICU hyperglycemia protocol  NEUROLOGIC A:  Baseline dementia Baseline bipolar disorder Acute encephalopathy likely due to sepsis P:   - MInimize sedation - Supporitve care - Stable  GENITOURINARY A: 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 Pressors and lines OK - per discussion on admission with sisters including HCPOA. They state he has always been resistant to medical care and do not feel he would want life support.   TODAY'S SUMMARY: Pt overall doing better, stable on O2 by [place]. Azithromycin discontinued today due to prolonged QTc and likely aspiration pneumonia. Will obtain CXR tomorrow. Abdomen is soft and nontender but still distended. Pt is having diarrheal stools. Will  check c diff, add diet, and repeat abdominal xray tomorrow. With difficult foley catheter placement, possible obstruction component and will keep foley in place. Hypokalemia improved with Kdur. Will recheck tomorrow. Will eval left lower extremity swelling with LE doppler.  I have personally obtained a history, examined the patient, evaluated laboratory and imaging results, formulated the assessment and plan and placed orders. CRITICAL CARE: The patient is critically ill with multiple organ systems failure and requires high complexity decision making for assessment and support, frequent evaluation and titration of therapies, application of advanced monitoring technologies and extensive interpretation of multiple databases.   Leona Singleton, MD, Family Practice PGY-2 Pulmonary and Critical Care Medicine [facility] Pager: [phone]  07/03/2013, 10:36 AM   PCCM ATTENDING: I have interviewed and examined the patient and reviewed the database. I have formulated the assessment and plan as reflected in the note above with amendments  made by me.  Dontaye Fischer, MD;  PCCM service; Mobile [phone]

## 2013-07-03 NOTE — Progress Notes (Signed)
ANTICOAGULATION CONSULT NOTE - Follow Up  Pharmacy Consult for Heparin  Indication: chest pain/ACS  No Known Allergies Patient Measurements: Height: 5\' 5"  (165.1 cm) Weight: 140 lb 14 oz (63.9 kg) IBW/kg (Calculated) : 61.5 Heparin Dosing Weight: 73.5 kg Vital Signs: Temp: 98.3 F (36.8 C) (11/03 0754) Temp src: Oral (11/03 0754) BP: 119/75 mmHg (11/03 0800) Pulse Rate: 83 (11/03 0800) Labs:  Recent Labs  07/02/13 1124 07/02/13 1125 07/02/13 1839 07/03/13 0020 07/03/13 0820  HGB 13.4  --   --   --   --   HCT 36.8*  --   --   --   --   PLT 185  --   --   --   --   HEPARINUNFRC  --   --   --  0.36 0.17*  CREATININE 1.73*  --  1.91* 1.64*  --   TROPONINI  --  6.06*  --   --   --    Estimated Creatinine Clearance: 37.5 ml/min (by C-G formula based on Cr of 1.64).   Medical History: Past Medical History  Diagnosis Date  . HTN (hypertension)   . 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 Dyslipidemia   . Bipolar affective disorder   . Chronic renal insufficiency   . Dementia     Assessment: 67 y.o. M with cardiac history significant for HOCM who presented to the [facility] on 11/2 with fevers, diarrhea, and cough and was noted to have an elevated troponin. Pharmacy was consulted to start heparin for NSTEMI while awaiting further cardiology work-up -- to get cardiac cath when more stable.   The patient was on no anticoagulants PTA and has no recent surgeries or bleeding noted. Hep Wt: 73.5 kg, baseline CBC okay.   Heparin level now sub-therapeutic on confirmatory level on 900 units/hr.   Goal of Therapy:  Heparin level 0.3-0.7 units/ml Monitor platelets by anticoagulation protocol: Yes   Plan:  1. Heparin bolus of 2000 units x 1, then increase heparin drip at a rate of 1150 units/hr (11.5 ml/hr) 2. Recheck heparin level in 6 hours. 3. Follow-up for signs and symptoms of bleeding.    Link Snuffer, PharmD, BCPS Clinical  Pharmacist [phone] 07/03/2013 9:34 AM

## 2013-07-03 NOTE — Progress Notes (Signed)
ANTICOAGULATION CONSULT NOTE - Follow Up  Pharmacy Consult for Heparin  Indication: chest pain/ACS; DVT in peroneal vein  No Known Allergies  Patient Measurements: Height: 5\' 5"  (165.1 cm) Weight: 140 lb 14 oz (63.9 kg) IBW/kg (Calculated) : 61.5 Heparin Dosing Weight: 64 kg  Vital Signs: Temp: 98.3 F (36.8 C) (11/03 1155) Temp src: Oral (11/03 1155) BP: 112/82 mmHg (11/03 1300) Pulse Rate: 78 (11/03 1600) Labs:  Recent Labs  07/02/13 1124 07/02/13 1125 07/02/13 1839 07/03/13 0020 07/03/13 0820 07/03/13 0859 07/03/13 1600  HGB 13.4  --   --   --   --   --   --   HCT 36.8*  --   --   --   --   --   --   PLT 185  --   --   --   --   --   --   HEPARINUNFRC  --   --   --  0.36 0.17*  --  0.31  CREATININE 1.73*  --  1.91* 1.64*  --  1.48*  --   TROPONINI  --  6.06*  --   --   --  4.53*  --    Estimated Creatinine Clearance: 41.6 ml/min (by C-G formula based on Cr of 1.48).   Medical History: Past Medical History  Diagnosis Date  . HTN (hypertension)   . 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 Dyslipidemia   . Bipolar affective disorder   . Chronic renal insufficiency   . Dementia     Assessment: 67 y.o. M with cardiac history significant for HOCM who presented to the [facility] on 11/2 with fevers, diarrhea, and cough and was noted to have an elevated troponin. Pharmacy was consulted to start heparin for NSTEMI while awaiting further cardiology work-up -- to get cardiac cath when more stable. Pt also noted to have DVT in R peroneal vein on venous duplex 11/3 - will need long-term anticoagulation post cath.  Heparin level now at low end of therapeutic on 1150 units/hr. No bleeding noted.  Goal of Therapy:  Heparin level 0.3-0.7 units/ml Monitor platelets by anticoagulation protocol: Yes   Plan:  1. Increase heparin drip to 1250 units/hr to maintain in therapeutic range. 2. F/u a.m. heparin level and CBC 3. Follow-up for signs and symptoms  of bleeding.    Christoper Fabian, PharmD, BCPS Clinical pharmacist, pager [phone] 07/03/2013 5:35 PM

## 2013-07-03 NOTE — Progress Notes (Signed)
ANTICOAGULATION CONSULT NOTE - Follow Up  Pharmacy Consult for Heparin  Indication: chest pain/ACS  No Known Allergies  Patient Measurements: Height: 5\' 5"  (165.1 cm) Weight: 162 lb (73.483 kg) IBW/kg (Calculated) : 61.5 Heparin Dosing Weight: 73.5 kg  Vital Signs: Temp: 97.6 F (36.4 C) (11/03 0008) Temp src: Oral (11/03 0008) BP: 109/60 mmHg (11/03 0000) Pulse Rate: 82 (11/03 0000)  Labs:  Recent Labs  07/02/13 1124 07/02/13 1125 07/02/13 1839 07/03/13 0020  HGB 13.4  --   --   --   HCT 36.8*  --   --   --   PLT 185  --   --   --   HEPARINUNFRC  --   --   --  0.36  CREATININE 1.73*  --  1.91*  --   TROPONINI  --  6.06*  --   --     Estimated Creatinine Clearance: 32.2 ml/min (by C-G formula based on Cr of 1.91).   Medical History: Past Medical History  Diagnosis Date  . HTN (hypertension)   . 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 Dyslipidemia   . Bipolar affective disorder   . Chronic renal insufficiency   . Dementia     Assessment: 67 y.o. M with cardiac history significant for HOCM who presented to the [facility] on 11/2 with fevers, diarrhea, and cough and was noted to have an elevated troponin. Pharmacy was consulted to start heparin for NSTEMI while awaiting further cardiology work-up -- to get cardiac cath when more stable.   The patient was on no anticoagulants PTA and has no recent surgeries or bleeding noted. Hep Wt: 73.5 kg, baseline CBC okay. Initial heparin level 0.36 units/ml  Goal of Therapy:  Heparin level 0.3-0.7 units/ml Monitor platelets by anticoagulation protocol: Yes   Plan:  1. Cont heparin drip at a rate of 900 units/hr (9 ml/hr) 2. Check heparin level in 6 hours to confirm   Talbert Cage, PharmD Clinical Pharmacist 07/03/2013 1:05 AM

## 2013-07-03 NOTE — Progress Notes (Signed)
  Echocardiogram 2D Echocardiogram has been performed.  [place]ian Co 07/03/2013, 5:27 PM

## 2013-07-03 NOTE — Progress Notes (Signed)
CRITICAL VALUE ALERT  Critical value received:  Potassium 2.1   Date of notification: 07/02/2013  Time of notification: 0020  Critical value read back :yes  Nurse who received alert: Jeson Camacho RN  MD notified (1st page): Dr Shelba Flake.  Time of first page:  0025  MD notified (2nd page):  Time of second page:  Responding MD:   Dr Shelba Flake  Time MD responded:  [phone]

## 2013-07-04 ENCOUNTER — Inpatient Hospital Stay (HOSPITAL_COMMUNITY): Payer: Medicare Other

## 2013-07-04 DIAGNOSIS — T839XXA Unspecified complication of genitourinary prosthetic device, implant and graft, initial encounter: Secondary | ICD-10-CM

## 2013-07-04 LAB — CBC
Hemoglobin: 12 g/dL — ABNORMAL LOW (ref 13.0–17.0)
MCH: 28.9 pg (ref 26.0–34.0)
MCHC: 34.5 g/dL (ref 30.0–36.0)
Platelets: 217 10*3/uL (ref 150–400)
RBC: 4.15 MIL/uL — ABNORMAL LOW (ref 4.22–5.81)
WBC: 6.6 10*3/uL (ref 4.0–10.5)

## 2013-07-04 LAB — BASIC METABOLIC PANEL
CO2: 19 mEq/L (ref 19–32)
Calcium: 8.1 mg/dL — ABNORMAL LOW (ref 8.4–10.5)
Chloride: 112 mEq/L (ref 96–112)
GFR calc Af Amer: 51 mL/min — ABNORMAL LOW (ref >90)
Glucose, Bld: 93 mg/dL (ref 70–99)
Sodium: 143 mEq/L (ref 135–145)

## 2013-07-04 MED ORDER — REGADENOSON 0.4 MG/5ML IV SOLN
0.4000 mg | Freq: Once | INTRAVENOUS | Status: AC
  Administered 2013-07-05: 0.4 mg via INTRAVENOUS
  Filled 2013-07-04: qty 5

## 2013-07-04 MED ORDER — POTASSIUM CHLORIDE CRYS ER 20 MEQ PO TBCR
40.0000 meq | EXTENDED_RELEASE_TABLET | ORAL | Status: AC
  Administered 2013-07-04 (×2): 40 meq via ORAL
  Filled 2013-07-04: qty 2
  Filled 2013-07-04: qty 4

## 2013-07-04 MED ORDER — FUROSEMIDE 40 MG PO TABS
40.0000 mg | ORAL_TABLET | Freq: Every day | ORAL | Status: DC
  Administered 2013-07-04 – 2013-07-09 (×6): 40 mg via ORAL
  Filled 2013-07-04 (×7): qty 1

## 2013-07-04 NOTE — Progress Notes (Signed)
eLink Physician-Brief Progress Note Patient Name: Eric Zamora DOB: 1945-10-12 MRN: [id number]  Date of Service  07/04/2013   HPI/Events of Note   Hypokalemia  eICU Interventions  Potassium replaced   Intervention Category Intermediate Interventions: Electrolyte abnormality - evaluation and management  DETERDING,ELIZABETH 07/04/2013, 6:07 AM

## 2013-07-04 NOTE — Progress Notes (Signed)
Noted urine in the foley is bright red, pt. on heparin, PA made aware. With no order.

## 2013-07-04 NOTE — Progress Notes (Signed)
PULMONARY  / CRITICAL CARE MEDICINE  Name: Eric Zamora MRN: [id number] DOB: 1945/10/10    ADMISSION DATE:  07/02/2013 CONSULTATION DATE:  07/02/2013  REFERRING MD :  Jodi Mourning PRIMARY SERVICE: PCCM  CHIEF COMPLAINT:  Shortness of breath  BRIEF PATIENT DESCRIPTION: 67 y/o male with dementia and HOCM presented to the [facility] ED on 11/2 with several days of dyspnea. Noted to have an NSTEMI, severe sepsis, pneumonia vs acute CHF exacerbation, and an acute abdomen.   SIGNIFICANT EVENTS / STUDIES:  11/2: CT abdomen>pneumonia v atelectasis, dilated bowel loops. Started BiPAP 11/3: To O2 by [place]. D/c azithro due to prolonged QTc and hypokalemia.  11/3 LE duplex > Rt peroneal DVT   LINES / TUBES: 11/1: Foley catheter >  CULTURES: 11/2 blood>>ng 11.2 urine>>ng   ANTIBIOTICS: 11/2 zosyn >> 11/2 azithromycin >>11/3 d/c azithromycin  SUBJECTIVE: Feels improved today  No cough ,  Abdomen is soft Bld tinged urine   VITAL SIGNS: Temp:  [97.6 F (36.4 C)-99.5 F (37.5 C)] 97.6 F (36.4 C) (11/04 1206) Pulse Rate:  [69-85] 73 (11/04 1539) Resp:  [14-24] 21 (11/04 1539) BP: (93-139)/(44-79) 94/44 mmHg (11/04 1539) SpO2:  [97 %-100 %] 100 % (11/04 1539) Weight:  [140 lb 14 oz (63.9 kg)] 140 lb 14 oz (63.9 kg) (11/04 0500) HEMODYNAMICS:   VENTILATOR SETTINGS:   INTAKE / OUTPUT: Intake/Output     11/03 0701 - 11/04 0700 11/04 0701 - 11/05 0700   P.O. 280 400   I.V. (mL/kg) 307.6 (4.8) 100 (1.6)   IV Piggyback 150    Total Intake(mL/kg) 737.6 (11.5) 500 (7.8)   Urine (mL/kg/hr) 360 (0.2) 550 (1)   Total Output 360 550   Net +377.6 -50        Urine Occurrence  1 x   Stool Occurrence 3 x 1 x     PHYSICAL EXAMINATION: General:  NAD, pleasant Neuro:  Awake, alert, mild dysarthria HEENT:  AT/[place], sclera clear, EOMI Cardiovascular:  RRR per 2+ DP pulse Lungs:  Normal effort, occasional cough, decreased air movement left lung Abdomen:  Soft, distended, nontender, bowel sounds  present Musculoskeletal:  2+ left LE edema to mid-calf, 1+ right lower extremity edema Skin:  No rash or cyanosis GU: Foley catheter in place draining bld tinged urine  LABS: Reviewed and noted in assessment and plan.   CXR: improved left airspace disease;   ASSESSMENT / PLAN:  PULMONARY A: Acute hypoxemic resp failure due to pneumonia vs pulmonary edema, pna more likely given unilateral but rapid resolution favors edema. BNP 18,500. P:   - Started on BiPAP, now on O2 by [place] - Family does not want intubation - PRN albuterol - Zosyn and azithro - 11/3 d/c azithro with prolonged QTc, more likely aspiration pneumonia   CARDIOVASCULAR A:  NSTEMI likely from demand from sepsis, Trop 6 RLE DVT Severe sepsis HOCM HTN P:  - heparin drip - recommend for DVT -can change to lovenox in 24h  RENAL A:   AKI?  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 Pressors and lines OK - per discussion on admission with sisters including HCPOA. They state he has always been resistant to medical care and do not feel he would want life support.   TODAY'S SUMMARY: Improved infx- favor asymmetric edema rather than pna Will ask [facility] to take over in am    Oretha Milch, MD Pulmonary and Critical Care  Medicine [facility] Pager: [phone]  07/04/2013, 3:51 PM   PCCM ATTENDING: I have interviewed and examined the patient and reviewed the database. I have formulated the assessment and plan as reflected in the note above with amendments made by me.  Ruben Fischer, MD;  PCCM service; Mobile [phone]

## 2013-07-04 NOTE — Clinical Social Work Note (Signed)
CSW received consult for SNF. Pt's sister requesting meeting with CSW for tomorrow. CSW will meet with sister at 9:15am. CSW will complete assessment and work up for SNF tomorrow.   Maryclare Labrador, MSW, [facility] Clinical Social Worker [phone]

## 2013-07-04 NOTE — Care Management Note (Addendum)
    Page 1 of 1   07/07/2013     11:52:30 AM   CARE MANAGEMENT NOTE 07/07/2013  Patient:  ZADKIEL, DRAGAN   Account Number:  [id number]  Date Initiated:  07/03/2013  Documentation initiated by:  Avie Arenas  Subjective/Objective Assessment:   Admitted with positive troponins - sepsis, abdominal pain, resp failure.     Action/Plan:   Anticipated DC Date:  07/07/2013   Anticipated DC Plan:  HOME W HOME HEALTH SERVICES  In-house referral  Clinical Social Worker      DC Planning Services  CM consult  Medication Assistance      Choice offered to / List presented to:             Status of service:  Completed, signed off Medicare Important Message given?   (If response is "NO", the following Medicare IM given date fields will be blank) Date Medicare IM given:   Date Additional Medicare IM given:    Discharge Disposition:  SKILLED NURSING FACILITY  Per UR Regulation:  Reviewed for med. necessity/level of care/duration of stay  If discussed at Long Length of Stay Meetings, dates discussed:    Comments:  Contact:  WADDELL,CYNTHIA   [phone] 11/7 1150a debbie Milt Coye rn,bsn spoke w pt and sister. gave them 30day free xarelto card. sister states he has plan to help w meds. they use [facility] on [address]. 11/4 1108a debbie Tyrick Dunagan rn,bsn pt lives w sisters. both sisters work. they are interestd in short term snf for rehab. appt sched w sw on 11-5 at 9:15 in pt's room. sw ref made.

## 2013-07-04 NOTE — Progress Notes (Signed)
ANTICOAGULATION CONSULT NOTE - Follow Up  Pharmacy Consult for Heparin  Indication: chest pain/ACS; DVT in peroneal vein  No Known Allergies  Patient Measurements: Height: 5\' 5"  (165.1 cm) Weight: 140 lb 14 oz (63.9 kg) IBW/kg (Calculated) : 61.5 Heparin Dosing Weight: 64 kg  Vital Signs: Temp: 99.5 F (37.5 C) (11/04 0418) Temp src: Oral (11/04 0418) BP: 115/58 mmHg (11/04 0700) Pulse Rate: 75 (11/04 0700) Labs:  Recent Labs  07/02/13 1124 07/02/13 1125  07/03/13 0020 07/03/13 0820 07/03/13 0859 07/03/13 1600 07/04/13 0400  HGB 13.4  --   --   --   --   --   --  12.0*  HCT 36.8*  --   --   --   --   --   --  34.8*  PLT 185  --   --   --   --   --   --  217  HEPARINUNFRC  --   --   < > 0.36 0.17*  --  0.31 0.40  CREATININE 1.73*  --   < > 1.64*  --  1.48*  --  1.57*  TROPONINI  --  6.06*  --   --   --  4.53*  --   --   < > = values in this interval not displayed. Estimated Creatinine Clearance: 39.2 ml/min (by C-G formula based on Cr of 1.57).   Medical History: Past Medical History  Diagnosis Date  . HTN (hypertension)   . 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 Dyslipidemia   . Bipolar affective disorder   . Chronic renal insufficiency   . Dementia     Assessment: 67 y.o. M with cardiac history significant for HOCM who presented to the [facility] on 11/2 with fevers, diarrhea, and cough and was noted to have an elevated troponin. Pharmacy was consulted to start heparin for NSTEMI while awaiting further cardiology work-up -- to get cardiac cath when more stable. Pt also noted to have DVT in R peroneal vein on venous duplex 11/3 - will need long-term anticoagulation post cath.  Heparin level now therapeutic on heparin 1250 units/hr. No bleeding or complications noted.  Goal of Therapy:  Heparin level 0.3-0.7 units/ml Monitor platelets by anticoagulation protocol: Yes   Plan:  1. Continue IV heparin at current rate.  Continue daily  heparin level and CBC. 2. F/u plans for cath vs. myoview. 3. Follow-up for signs and symptoms of bleeding.    Tad Moore, BCPS  Clinical Pharmacist Pager [phone]  07/04/2013 8:07 AM

## 2013-07-04 NOTE — Progress Notes (Signed)
Subjective: Feeling much better. Denies SOB and CP.   Objective: Vital signs in last 24 hours: Temp:  [98.3 F (36.8 C)-99.5 F (37.5 C)] 99.3 F (37.4 C) (11/04 0800) Pulse Rate:  [69-87] 81 (11/04 0800) Resp:  [14-24] 24 (11/04 0800) BP: (93-139)/(46-85) 117/62 mmHg (11/04 0800) SpO2:  [96 %-100 %] 100 % (11/04 0800) Weight:  [140 lb 14 oz (63.9 kg)] 140 lb 14 oz (63.9 kg) (11/04 0500) Last BM Date: 07/04/13  Intake/Output from previous day: 11/03 0701 - 11/04 0700 In: 737.6 [P.O.:280; I.V.:307.6; IV Piggyback:150] Out: 360 [Urine:360] Intake/Output this shift: Total I/O In: 12.5 [I.V.:12.5] Out: -   Medications Current Facility-Administered Medications  Medication Dose Route Frequency Provider Last Rate Last Dose  . albuterol (PROVENTIL) (5 MG/ML) 0.5% nebulizer solution 2.5 mg  2.5 mg Nebulization Q3H PRN Lupita Leash, MD      . antiseptic oral rinse (BIOTENE) solution 15 mL  15 mL Mouth Rinse BID Lupita Leash, MD   15 mL at 07/04/13 0800  . aspirin chewable tablet 81 mg  81 mg Oral Daily Lupita Leash, MD   81 mg at 07/04/13 0909  . famotidine (PEPCID) tablet 20 mg  20 mg Oral BID Leona Singleton, MD   20 mg at 07/04/13 0909  . heparin ADULT infusion 100 units/mL (25000 units/250 mL)  1,250 Units/hr Intravenous Continuous Hilario Quarry Amend, RPH 12.5 mL/hr at 07/04/13 0910 1,250 Units/hr at 07/04/13 0910  . influenza vac split quadrivalent PF (FLUARIX) injection 0.5 mL  0.5 mL Intramuscular Tomorrow-1000 Merwyn Katos, MD      . metoprolol tartrate (LOPRESSOR) tablet 12.5 mg  12.5 mg Oral BID Lupita Leash, MD   12.5 mg at 07/04/13 0908  . piperacillin-tazobactam (ZOSYN) IVPB 3.375 g  3.375 g Intravenous Q8H Gala Lewandowsky Jeffers, RPH   3.375 g at 07/04/13 0516  . pneumococcal 23 valent vaccine (PNU-IMMUNE) injection 0.5 mL  0.5 mL Intramuscular Tomorrow-1000 Merwyn Katos, MD      . potassium chloride SA (K-DUR,KLOR-CON) CR tablet 40 mEq  40  mEq Oral Q4H Zigmund Gottron, MD   40 mEq at 07/04/13 1610    PE: General appearance: alert, cooperative and no distress Lungs: decrased BS bilateraly. faint crackles at bases Heart: regular rate and rhythm and 4/6 murmur heard throughout precordium  Extremities: no LEE Pulses: 2+ and symmetric Skin: warm and dry Neurologic: Grossly normal  Lab Results:   Recent Labs  07/02/13 1124 07/04/13 0400  WBC 11.0* 6.6  HGB 13.4 12.0*  HCT 36.8* 34.8*  PLT 185 217   BMET  Recent Labs  07/03/13 0020 07/03/13 0859 07/04/13 0400  NA 141 142 143  K 2.1* 3.4* 3.1*  CL 107 110 112  CO2 21 17* 19  GLUCOSE 111* 102* 93  BUN 31* 31* 27*  CREATININE 1.64* 1.48* 1.57*  CALCIUM 8.0* 8.2* 8.1*   Cardiac Panel (last 3 results)  Recent Labs  07/02/13 1125 07/03/13 0859  TROPONINI 6.06* 4.53*    Studies/Results:  2D echo 07/03/13   Study Conclusions  - Left ventricle: The cavity size was normal. Wall thickness was increased in a pattern of mild LVH. Mild dysproportionate upper septal thickening with focal region of mid septal thinning. Systolic function was normal. The estimated ejection fraction was in the range of 55% to 60%. Wall motion was normal; there were no regional wall motion abnormalities. Features are consistent with a pseudonormal left ventricular filling pattern, with  concomitant abnormal relaxation and increased filling pressure (grade 2 diastolic dysfunction). Doppler parameters are consistent with elevated mean left atrial filling pressure. - Aortic valve: Trileaflet; mildly thickened, mildly calcified leaflets with focal sclerosis of the left coronary cusp Trivial regurgitation. - Mitral valve: Mildly calcified annulus. Mild to moderate regurgitation. - Left atrium: The atrium was mildly dilated. - Right ventricle: Systolic pressure was increased. - Atrial septum: No defect or patent foramen ovale was identified. - Pulmonary arteries: PA peak  pressure: 43mm Hg (S). Impressions:  - The right ventricular systolic pressure was increased consistent with mild pulmonary hypertension.  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 Breathing much better. No JVD. Minimal crackles on exam. CXR for today pending.  No chest pain. Echo yesterday revealed normal systolic function with no WMA. Plan is for 72 hrs on heparin with plan to d/c tomorrow. However, RN noticed urine appeared "pink tented" this am. Pt has a foley. Will need to monitor for hematuria. May need to prematurely d/c heparin if needed. ? NST vs cath tomorrow. Will replete potassium.     LOS: 2 days    Brittainy M. Delmer Islam 07/04/2013 9:17 AM  Agree with note written by Boyce Medici  PAC  I/Os really don't show neg fluid balance. He only received one dose of IV lasix on admission. BNP 18K and trop 6. No CP. Scr improving. On ATBX. 2D nl LVFXN. CXR improving, Will add PO lasix and arrange for Lexiscan myoview to R/O obstructive CAD vs Type 2 MI.   Runell Gess 07/04/2013 10:58 AM

## 2013-07-05 ENCOUNTER — Inpatient Hospital Stay (HOSPITAL_COMMUNITY): Payer: Medicare Other

## 2013-07-05 DIAGNOSIS — I214 Non-ST elevation (NSTEMI) myocardial infarction: Secondary | ICD-10-CM

## 2013-07-05 LAB — BASIC METABOLIC PANEL
BUN: 23 mg/dL (ref 6–23)
BUN: 19 mg/dL (ref 6–23)
CO2: 20 mEq/L (ref 19–32)
CO2: 21 mEq/L (ref 19–32)
Chloride: 112 mEq/L (ref 96–112)
Chloride: 110 mEq/L (ref 96–112)
Creatinine, Ser: 1.44 mg/dL — ABNORMAL HIGH (ref 0.50–1.35)
Creatinine, Ser: 1.35 mg/dL (ref 0.50–1.35)
GFR calc Af Amer: 56 mL/min — ABNORMAL LOW (ref >90)
Glucose, Bld: 103 mg/dL — ABNORMAL HIGH (ref 70–99)
Potassium: 2.1 mEq/L — CL (ref 3.5–5.1)
Sodium: 143 mEq/L (ref 135–145)
Sodium: 143 mEq/L (ref 135–145)

## 2013-07-05 LAB — HEPARIN LEVEL (UNFRACTIONATED): Heparin Unfractionated: 0.33 IU/mL (ref 0.30–0.70)

## 2013-07-05 LAB — CBC
HCT: 33.8 % — ABNORMAL LOW (ref 39.0–52.0)
Hemoglobin: 11.9 g/dL — ABNORMAL LOW (ref 13.0–17.0)
MCV: 83.7 fL (ref 78.0–100.0)
RBC: 4.04 MIL/uL — ABNORMAL LOW (ref 4.22–5.81)
RDW: 13.8 % (ref 11.5–15.5)
WBC: 6.7 10*3/uL (ref 4.0–10.5)

## 2013-07-05 LAB — MAGNESIUM: Magnesium: 2.1 mg/dL (ref 1.5–2.5)

## 2013-07-05 MED ORDER — TECHNETIUM TC 99M SESTAMIBI GENERIC - CARDIOLITE
10.0000 | Freq: Once | INTRAVENOUS | Status: AC | PRN
  Administered 2013-07-05: 10 via INTRAVENOUS

## 2013-07-05 MED ORDER — REGADENOSON 0.4 MG/5ML IV SOLN
INTRAVENOUS | Status: AC
  Administered 2013-07-05: 0.4 mg via INTRAVENOUS
  Filled 2013-07-05: qty 5

## 2013-07-05 MED ORDER — POTASSIUM CHLORIDE 10 MEQ/100ML IV SOLN
10.0000 meq | INTRAVENOUS | Status: AC
  Administered 2013-07-05 (×4): 10 meq via INTRAVENOUS
  Filled 2013-07-05 (×4): qty 100

## 2013-07-05 MED ORDER — POTASSIUM CHLORIDE CRYS ER 20 MEQ PO TBCR
40.0000 meq | EXTENDED_RELEASE_TABLET | ORAL | Status: AC
  Administered 2013-07-05 (×2): 40 meq via ORAL
  Filled 2013-07-05 (×3): qty 2

## 2013-07-05 MED ORDER — POTASSIUM CHLORIDE CRYS ER 20 MEQ PO TBCR
40.0000 meq | EXTENDED_RELEASE_TABLET | ORAL | Status: AC
  Administered 2013-07-05 (×2): 40 meq via ORAL
  Filled 2013-07-05 (×2): qty 2

## 2013-07-05 MED ORDER — TECHNETIUM TC 99M SESTAMIBI GENERIC - CARDIOLITE
30.0000 | Freq: Once | INTRAVENOUS | Status: AC | PRN
  Administered 2013-07-05: 30 via INTRAVENOUS

## 2013-07-05 MED ORDER — POTASSIUM CHLORIDE CRYS ER 20 MEQ PO TBCR: 40.0000 meq | EXTENDED_RELEASE_TABLET | ORAL | Status: DC

## 2013-07-05 NOTE — Clinical Social Work Psychosocial (Signed)
Clinical Social Work Department BRIEF PSYCHOSOCIAL ASSESSMENT 07/05/2013  Patient:  Eric Zamora, Eric Zamora     Account Number:  [id number]     Admit date:  07/02/2013  Clinical Social Worker:  Varney Biles  Date/Time:  07/05/2013 09:58 AM  Referred by:  Physician  Date Referred:  07/05/2013 Referred for  SNF Placement   Other Referral:   Interview type:  Family Other interview type:    PSYCHOSOCIAL DATA Living Status:  FAMILY Admitted from facility:   Level of care:   Primary support name:  [name] ([phone]) Primary support relationship to patient:  SIBLING Degree of support available:   Good--pt's sister, [name], provides support to patient. Pt has another sister in [place].    CURRENT CONCERNS Current Concerns  Post-Acute Placement   Other Concerns:    SOCIAL WORK ASSESSMENT / PLAN At pt's sister's [name]) request, CSW met with pt's sister in the waiting room. CSW explained her role in discharge to SNF, and provided sister with SNF list. [name] gave CSW permission to send clinicals to all facilities in [facility]. CSW will call [name] when bed offers become available, and has invited [name] to call CSW if she has preference for any facilities.   Assessment/plan status:  Psychosocial Support/Ongoing Assessment of Needs Other assessment/ plan:   Information/referral to community resources:   SNF    PATIENT'S/FAMILY'S RESPONSE TO PLAN OF CARE: Pt's sister engaged in conversation with CSW and was understanding of CSW role.       Maryclare Labrador, MSW, [facility] Clinical Social Worker [phone]

## 2013-07-05 NOTE — Progress Notes (Signed)
Physical Therapy Treatment Patient Details Name: ORIS STAFFIERI MRN: [id number] DOB: 12/26/1945 Today's Date: 07/05/2013 Time: 4098-1191 PT Time Calculation (min): 24 min  PT Assessment / Plan / Recommendation  History of Present Illness 67 y/o male with dementia and HOCM presented to the [facility] ED on 11/2 with several days of dyspnea. Noted to have an NSTEMI, severe sepsis, pneumonia vs acute CHF exacerbation, and an acute abdomen.   PT Comments   Pt admitted with above. Pt currently with functional limitations due to balance and endurance deficits.  Pt will benefit from skilled PT to increase their independence and safety with mobility to allow discharge to the venue listed below.   Follow Up Recommendations  Supervision/Assistance - 24 hour;Home health PT                 Equipment Recommendations  None recommended by PT        Frequency Min 3X/week   Progress towards PT Goals Progress towards PT goals: Progressing toward goals  Plan Current plan remains appropriate    Precautions / Restrictions Precautions Precautions: Fall Precaution Comments: incontinent of stool   Pertinent Vitals/Pain VSS, no pain    Mobility  Transfers Transfers: Sit to Stand;Stand to Sit;Stand Pivot Transfers Sit to Stand: 5: Supervision;From bed;From chair/3-in-1 Stand to Sit: 5: Supervision;To chair/3-in-1;With armrests Stand Pivot Transfers: 5: Supervision Details for Transfer Assistance: cueing for hand placement and assist to manage lines. Pt  just cleaned by nurse as he was incontinent of stool  Ambulation/Gait Ambulation/Gait Assistance: 4: Min assist;3: Mod assist Ambulation Distance (Feet): 100 Feet Assistive device: None Ambulation/Gait Assistance Details: Pt ambulated with staggering gait with incr weight shift to right losing balance to right.  Needed assist to keep pt from falling multiple times with multiple LOB.  Spoke with pt and informed him a RW would make him safer.  Pt states his  sister can get him one.  Socks wet initially and replaced the socks prior to ambulation.   Gait Pattern: Decreased stride length;Decreased stance time - left;Decreased weight shift to left;Scissoring Gait velocity: decreased Stairs: No Wheelchair Mobility Wheelchair Mobility: No    Exercises General Exercises - Lower Extremity Ankle Circles/Pumps: AROM;Both;10 reps;Seated Long Arc Quad: AROM;Both;10 reps;Seated Hip Flexion/Marching: AROM;Both;10 reps;Seated   PT Goals (current goals can now be found in the care plan section)    Visit Information  Last PT Received On: 07/05/13 Assistance Needed: +1 History of Present Illness: 67 y/o male with dementia and HOCM presented to the [facility] ED on 11/2 with several days of dyspnea. Noted to have an NSTEMI, severe sepsis, pneumonia vs acute CHF exacerbation, and an acute abdomen.    Subjective Data  Subjective: "I been here 4 days."   Cognition  Cognition Arousal/Alertness: Awake/alert Behavior During Therapy: Flat affect Overall Cognitive Status: No family/caregiver present to determine baseline cognitive functioning    Balance  Balance Balance Assessed: Yes Dynamic Standing Balance Dynamic Standing - Balance Support: Right upper extremity supported;During functional activity Dynamic Standing - Level of Assistance: 3: Mod assist;4: Min assist Dynamic Standing - Balance Activities: Lateral lean/weight shifting;Forward lean/weight shifting;Reaching across midline Dynamic Standing - Comments: 2 min standing with challenges losing balance.   End of Session PT - End of Session Equipment Utilized During Treatment: Gait belt Activity Tolerance: Patient limited by fatigue Patient left: in chair;with call bell/phone within reach Nurse Communication: Mobility status        INGOLD,Adrienne Delay 07/05/2013, 3:37 PM [facility] Acute Rehabilitation [phone] (pager)

## 2013-07-05 NOTE — Clinical Social Work Placement (Addendum)
Clinical Social Work Department CLINICAL SOCIAL WORK PLACEMENT NOTE 07/05/2013  Patient:  Eric Zamora, Eric Zamora  Account Number:  [id number] Admit date:  07/02/2013  Clinical Social Worker:  Maryclare Labrador, Theresia Majors  Date/time:  07/05/2013 03:30 PM  Clinical Social Work is seeking post-discharge placement for this patient at the following level of care:   SKILLED NURSING   (*CSW will update this form in Epic as items are completed)   07/05/2013  Patient/family provided with [facility] Department of Clinical Social Work's list of facilities offering this level of care within the geographic area requested by the patient (or if unable, by the patient's family).  07/05/2013  Patient/family informed of their freedom to choose among providers that offer the needed level of care, that participate in Medicare, Medicaid or managed care program needed by the patient, have an available bed and are willing to accept the patient.  07/05/2013  Patient/family informed of [facility]' ownership interest in [facility], as well as of the fact that they are under no obligation to receive care at this facility.  PASARR submitted to EDS on 07/05/2013 PASARR number received from EDS on 07/05/2013  FL2 transmitted to all facilities in geographic area requested by pt/family on  07/05/2013 FL2 transmitted to all facilities within larger geographic area on   Patient informed that his/her managed care company has contracts with or will negotiate with  certain facilities, including the following:     Patient/family informed of bed offers received:   Patient chooses bed at [facility] Physician recommends and patient chooses bed at    Patient to be transferred to  on  07/10/2013 Patient to be transferred to facility by [facility]  The following physician request were entered in Epic:   Additional Comments:   Maryclare Labrador, MSW, [facility] Clinical Social Worker [phone]

## 2013-07-05 NOTE — Progress Notes (Signed)
CRITICAL VALUE ALERT  Critical value received:  Potassium  Date of notification:  07/05/2013  Time of notification:  0555  Critical value read back:yes  Nurse who received alert:  Elson Clan  MD notified (1st page):  Deterding, MD aware  Time of first page:    MD notified (2nd page):  Time of second page:  Responding MD:  Deterding, MD  Time MD responded:  [phone]

## 2013-07-05 NOTE — Progress Notes (Signed)
Subjective: No Complaints  Objective: Vital signs in last 24 hours: Temp:  [97.4 F (36.3 C)-99.1 F (37.3 C)] 98.8 F (37.1 C) (11/05 0800) Pulse Rate:  [68-79] 68 (11/05 0800) Resp:  [15-21] 19 (11/05 0800) BP: (93-129)/(44-94) 106/51 mmHg (11/05 0800) SpO2:  [100 %] 100 % (11/05 0800) Last BM Date: 07/04/13  Intake/Output from previous day: 11/04 0701 - 11/05 0700 In: 1215 [P.O.:790; I.V.:300; IV Piggyback:125] Out: 1075 [Urine:1075] Intake/Output this shift: Total I/O In: 25 [I.V.:12.5; IV Piggyback:12.5] Out: 100 [Urine:100]  Medications Current Facility-Administered Medications  Medication Dose Route Frequency Provider Last Rate Last Dose  . albuterol (PROVENTIL) (5 MG/ML) 0.5% nebulizer solution 2.5 mg  2.5 mg Nebulization Q3H PRN Lupita Leash, MD      . antiseptic oral rinse (BIOTENE) solution 15 mL  15 mL Mouth Rinse BID Lupita Leash, MD   15 mL at 07/04/13 2133  . aspirin chewable tablet 81 mg  81 mg Oral Daily Lupita Leash, MD   81 mg at 07/04/13 0909  . famotidine (PEPCID) tablet 20 mg  20 mg Oral BID Leona Singleton, MD   20 mg at 07/04/13 2134  . furosemide (LASIX) tablet 40 mg  40 mg Oral Daily Brittainy Simmons, PA-C   40 mg at 07/04/13 1202  . heparin ADULT infusion 100 units/mL (25000 units/250 mL)  1,250 Units/hr Intravenous Continuous Hilario Quarry Amend, RPH 12.5 mL/hr at 07/05/13 0800 1,250 Units/hr at 07/05/13 0800  . metoprolol tartrate (LOPRESSOR) tablet 12.5 mg  12.5 mg Oral BID Lupita Leash, MD   12.5 mg at 07/04/13 2133  . piperacillin-tazobactam (ZOSYN) IVPB 3.375 g  3.375 g Intravenous Q8H Gala Lewandowsky Chatom, RPH   3.375 g at 07/05/13 0554  . potassium chloride SA (K-DUR,KLOR-CON) CR tablet 40 mEq  40 mEq Oral Q4H Zigmund Gottron, MD   40 mEq at 07/05/13 0615  . regadenoson (LEXISCAN) 0.4 MG/5ML injection SOLN           . regadenoson (LEXISCAN) injection SOLN 0.4 mg  0.4 mg Intravenous Once Robbie Lis,  PA-C        PE: General appearance: alert, cooperative and no distress Lungs: clear to auscultation bilaterally Heart: regular rate and rhythm Extremities: No LEE Pulses: 2+ and symmetric Decrease pedal pulses. Neurologic: Grossly normal  Lab Results:   Recent Labs  07/02/13 1124 07/04/13 0400 07/05/13 0407  WBC 11.0* 6.6 6.7  HGB 13.4 12.0* 11.9*  HCT 36.8* 34.8* 33.8*  PLT 185 217 237   BMET  Recent Labs  07/03/13 0859 07/04/13 0400 07/05/13 0407  NA 142 143 143  K 3.4* 3.1* 2.1*  CL 110 112 112  CO2 17* 19 20  GLUCOSE 102* 93 103*  BUN 31* 27* 23  CREATININE 1.48* 1.57* 1.44*  CALCIUM 8.2* 8.1* 7.7*   PT/INR No results found for this basename: LABPROT, INR,  in the last 72 hours Cholesterol No results found for this basename: CHOL,  in the last 72 hours Cardiac Enzymes No components found with this basename: TROPONIN,  CKMB,   Studies/Results: @RISRSLT2 @   Assessment/Plan    Principal Problem:   Acute respiratory failure Active Problems:   NSTEMI (non-ST elevated myocardial infarction)   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  Giving fluid bolus.  ST depression in V3-5.   LOS: 3 days    Eric Zamora 07/05/2013 9:07 AM

## 2013-07-05 NOTE — Progress Notes (Signed)
eLink Physician-Brief Progress Note Patient Name: BENETT SWOYER DOB: [birth year] MRN: [id number]  Date of Service  07/05/2013   HPI/Events of Note  Hypokalemia   eICU Interventions  Potassium replaced   Intervention Category Intermediate Interventions: Electrolyte abnormality - evaluation and management  DETERDING,ELIZABETH 07/05/2013, 6:01 AM

## 2013-07-05 NOTE — Progress Notes (Signed)
Pt. Seen and examined. Agree with the NP/PA-C note as written.  Tolerated lexiscan, will review this afternoon. No chest pain. Blood pressure is low - agree with hydration for now. Hold lopressor tonight.  Potassium is alarmingly low this am - he is being repleted. Recheck potassium and magnesium this pm, replete as necessary.  Chrystie Nose, MD, [facility] Attending Cardiologist [facility]

## 2013-07-05 NOTE — Progress Notes (Signed)
[facility] Progress Note [place] TEAM 1 - Stepdown/ICU TEAM   Eric Zamora YNW:[id number] DOB: 1946/01/13 DOA: 07/02/2013 PCP: No primary provider on file.  Admit HPI / Brief Narrative: 67 y/o male with dementia and HOCM presented to [facility] 11/2 with several days of dyspnea. Noted to have NSTEMI, severe sepsis, pneumonia vs acute CHF exacerbation and ?acute abdomen. Stated that he had noted several days of dyspnea and cough. Family noted this had been going on ~1 week but pt had refused hospitalization. He is apparently stoic at baseline and had not noticed abdominal pain despite increasing distension. Also denied chest pain.  SIGNIFICANT EVENTS / STUDIES:  11/2: NSTEMI, likely demand from sepsis  11/2: CT abdomen>pneumonia v atelectasis, dilated bowel loops. Started BiPAP  11/3: To O2 by [place] 11/3 LE duplex > Rt peroneal DVT 11/5 - [facility] assumes care   Assessment/Plan:  Acute hypoxemic resp failure due to pneumonia vs pulmonary edema Respiratory status has rapidly improved suggesting pulmonary edema as probable etiology  Severe sepsis > ?severe CAP  Augmentin to complete therapy x 7ds as ordered by PCCM  NSTEMI likely from demand from sepsis lexiscan completed today - Cardiology following   Severe Hypokalemia Replace and follow closely with repeat labs today  Ileus CT abdomen with dilated bowel, most likely adynamic ileus - C diff neg - slowly advancing diet  Rt peroneal DVT recommend of anticoagulation therapyfor DVT - can change to lovenox once it is clear if he will need cardiac intervention or not  HTN Well-controlled at present  Obstructive Cardiomyopathy  Cardiology is following  Baseline dementia  Baseline bipolar disorder  Code Status: Limited code, no CPR, no mechanical ventilation, no shocks. Pressors and lines OK  Family Communication: no family present at time of exam Disposition Plan: SDU  Consultants: Cardiology    Procedures: Lexiscan - 11/5 - pending   Antibiotics: 11/2 zosyn >>  11/2 azithromycin >> 11/3   DVT prophylaxis: IV heparin   HPI/Subjective: The patient is resting comfortably at the bedside at the time of my exam.  He reports he feels much better.  He denies chest pain fevers chills nausea or vomiting.  Objective: Blood pressure 117/57, pulse 68, temperature 98.8 F (37.1 C), temperature source Oral, resp. rate 19, height 5\' 5"  (1.651 m), weight 63.9 kg (140 lb 14 oz), SpO2 100.00%.  Intake/Output Summary (Last 24 hours) at 07/05/13 1126 Last data filed at 07/05/13 0900  Gross per 24 hour  Intake   1015 ml  Output   1175 ml  Net   -160 ml   Exam: General: No acute respiratory distress at rest Lungs: Mild basilar crackles with no wheeze with good air movement throughout other fields Cardiovascular: Regular rate and rhythm without murmur gallop or rub normal S1 and S2 Abdomen: Nontender, nondistended, soft, bowel sounds positive, no rebound, no ascites, no appreciable mass Extremities: No significant cyanosis, clubbing, or edema bilateral lower extremities  Data Reviewed: Basic Metabolic Panel:  Recent Labs Lab 07/02/13 1226 07/02/13 1839 07/03/13 0020 07/03/13 0859 07/04/13 0400 07/05/13 0407  NA  --  143 141 142 143 143  K  --  2.1* 2.1* 3.4* 3.1* 2.1*  CL  --  107 107 110 112 112  CO2  --  21 21 17* 19 20  GLUCOSE  --  132* 111* 102* 93 103*  BUN  --  33* 31* 31* 27* 23  CREATININE  --  1.91* 1.64* 1.48* 1.57* 1.44*  CALCIUM  --  8.3* 8.0* 8.2* 8.1* 7.7*  MG 2.2  --   --   --   --   --    Liver Function Tests: No results found for this basename: AST, ALT, ALKPHOS, BILITOT, PROT, ALBUMIN,  in the last 168 hours No results found for this basename: LIPASE, AMYLASE,  in the last 168 hours No results found for this basename: AMMONIA,  in the last 168 hours CBC:  Recent Labs Lab 07/02/13 1124 07/04/13 0400 07/05/13 0407  WBC 11.0* 6.6 6.7  NEUTROABS  9.3*  --   --   HGB 13.4 12.0* 11.9*  HCT 36.8* 34.8* 33.8*  MCV 83.4 83.9 83.7  PLT 185 217 237   Cardiac Enzymes:  Recent Labs Lab 07/02/13 1125 07/03/13 0859  TROPONINI 6.06* 4.53*   BNP (last 3 results)  Recent Labs  07/02/13 1125  PROBNP 18591.0*   CBG:  Recent Labs Lab 07/02/13 1551  GLUCAP 131*    Recent Results (from the past 240 hour(s))  MRSA PCR SCREENING     Status: None   Collection Time    07/02/13  3:47 PM      Result Value Range Status   MRSA by PCR NEGATIVE  NEGATIVE Final   Comment:            The GeneXpert MRSA Assay (FDA     approved for NASAL specimens     only), is one component of a     comprehensive MRSA colonization     surveillance program. It is not     intended to diagnose MRSA     infection nor to guide or     monitor treatment for     MRSA infections.  URINE CULTURE     Status: None   Collection Time    07/02/13  3:48 PM      Result Value Range Status   Specimen Description URINE, CATHETERIZED   Final   Special Requests Normal   Final   Culture  Setup Time     Final   Value: 07/02/2013 21:49     Performed at [facility] Count     Final   Value: NO GROWTH     Performed at [facility]   Culture     Final   Value: NO GROWTH     Performed at [facility]   Report Status 07/03/2013 FINAL   Final  CULTURE, BLOOD (ROUTINE X 2)     Status: None   Collection Time    07/02/13  4:33 PM      Result Value Range Status   Specimen Description BLOOD LEFT ARM   Final   Special Requests BOTTLES DRAWN AEROBIC ONLY 3CC   Final   Culture  Setup Time     Final   Value: 07/02/2013 21:48     Performed at [facility]   Culture     Final   Value:        BLOOD CULTURE RECEIVED NO GROWTH TO DATE CULTURE WILL BE HELD FOR 5 DAYS BEFORE ISSUING A FINAL NEGATIVE REPORT     Performed at [facility]   Report Status PENDING   Incomplete  CULTURE, BLOOD (ROUTINE X 2)     Status: None   Collection  Time    07/02/13  4:38 PM      Result Value Range Status   Specimen Description BLOOD RIGHT ANTECUBITAL   Final   Special Requests  BOTTLES DRAWN AEROBIC AND ANAEROBIC 10CC   Final   Culture  Setup Time     Final   Value: 07/02/2013 21:48     Performed at [facility]   Culture     Final   Value:        BLOOD CULTURE RECEIVED NO GROWTH TO DATE CULTURE WILL BE HELD FOR 5 DAYS BEFORE ISSUING A FINAL NEGATIVE REPORT     Performed at [facility]   Report Status PENDING   Incomplete  CLOSTRIDIUM DIFFICILE BY PCR     Status: None   Collection Time    07/03/13  2:01 PM      Result Value Range Status   C difficile by pcr NEGATIVE  NEGATIVE Final     Studies:  Recent x-ray studies have been reviewed in detail by the Attending Physician  Scheduled Meds:  Scheduled Meds: . antiseptic oral rinse  15 mL Mouth Rinse BID  . aspirin  81 mg Oral Daily  . famotidine  20 mg Oral BID  . furosemide  40 mg Oral Daily  . piperacillin-tazobactam (ZOSYN)  IV  3.375 g Intravenous Q8H    Time spent on care of this patient: 35 mins   [facility] T  [facility] Office  [phone] Pager - Text Page per Loretha Stapler as per below:  On-Call/Text Page:      [email or web address]      password [email or web address]  If 7PM-7AM, please contact night-coverage [email or web address] Password [email or web address] 07/05/2013, 11:26 AM   LOS: 3 days

## 2013-07-05 NOTE — Progress Notes (Signed)
ANTICOAGULATION CONSULT NOTE - Follow Up  Pharmacy Consult for Heparin  Indication: chest pain/ACS; DVT in peroneal vein  No Known Allergies  Patient Measurements: Height: 5\' 5"  (165.1 cm) Weight: 140 lb 14 oz (63.9 kg) IBW/kg (Calculated) : 61.5 Heparin Dosing Weight: 64 kg  Vital Signs: Temp: 98.8 F (37.1 C) (11/05 0800) Temp src: Oral (11/05 0800) BP: 117/57 mmHg (11/05 0924) Pulse Rate: 68 (11/05 0800) Labs:  Recent Labs  07/02/13 1124 07/02/13 1125  07/03/13 0859 07/03/13 1600 07/04/13 0400 07/05/13 0407  HGB 13.4  --   --   --   --  12.0* 11.9*  HCT 36.8*  --   --   --   --  34.8* 33.8*  PLT 185  --   --   --   --  217 237  HEPARINUNFRC  --   --   < >  --  0.31 0.40 0.33  CREATININE 1.73*  --   < > 1.48*  --  1.57* 1.44*  TROPONINI  --  6.06*  --  4.53*  --   --   --   < > = values in this interval not displayed. Estimated Creatinine Clearance: 42.7 ml/min (by C-G formula based on Cr of 1.44).   Medical History: Past Medical History  Diagnosis Date  . HTN (hypertension)   . 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 Dyslipidemia   . Bipolar affective disorder   . Chronic renal insufficiency   . Dementia     Assessment: 67 y.o. M with cardiac history significant for HOCM who presented to the [facility] on 11/2 with fevers, diarrhea, and cough and was noted to have an elevated troponin. Pharmacy was consulted to start heparin for NSTEMI while awaiting further cardiology work-up -- to get cardiac cath when more stable. Pt also noted to have DVT in R peroneal vein on venous duplex 11/3 - will need long-term anticoagulation post cath.  Anticoagulation: NSTEMI- DVT in R peroneal vein on venous duplex 11/3 :: IV heparin.  At goal, after cath will need long term anticoag; H/H slightly down. Plts wnl  Infectious Disease: Severe sepsis (PNA vs CHF vs acute abd). LA 2.1.; afeb, wbc wnl  Zosyn 11/2>> x1 dose. Restarted 11/3 >> Azith  11/2>>11/3  11/2 Blood >> neg 11/2 Urine >> neg 11/2 MRSA PCR negative  Goal of Therapy:  Heparin level 0.3-0.7 units/ml Monitor platelets by anticoagulation protocol: Yes   Plan:  1. Continue IV heparin at current rate.  Continue daily heparin level and CBC. 2. F/u plans for cath vs. myoview. 3. Follow-up for signs and symptoms of bleeding.     Thank you for allowing pharmacy to be a part of this patients care team.  Lovenia Kim Pharm.D., BCPS Clinical Pharmacist 07/05/2013 9:47 AM Pager: [phone] Phone: [phone]

## 2013-07-06 ENCOUNTER — Encounter (HOSPITAL_COMMUNITY)
Admission: EM | Disposition: A | Discharge status: Skilled Nursing Facility | Payer: Self-pay | Source: Home / Self Care | Attending: Internal Medicine

## 2013-07-06 ENCOUNTER — Inpatient Hospital Stay (HOSPITAL_COMMUNITY): Payer: Medicare Other

## 2013-07-06 DIAGNOSIS — R931 Abnormal findings on diagnostic imaging of heart and coronary circulation: Secondary | ICD-10-CM | POA: Diagnosis present

## 2013-07-06 DIAGNOSIS — R9439 Abnormal result of other cardiovascular function study: Secondary | ICD-10-CM

## 2013-07-06 DIAGNOSIS — I2510 Atherosclerotic heart disease of native coronary artery without angina pectoris: Secondary | ICD-10-CM

## 2013-07-06 HISTORY — PX: LEFT HEART CATHETERIZATION WITH CORONARY ANGIOGRAM: SHX5451

## 2013-07-06 LAB — BASIC METABOLIC PANEL
BUN: 17 mg/dL (ref 6–23)
CO2: 20 mEq/L (ref 19–32)
Calcium: 8.3 mg/dL — ABNORMAL LOW (ref 8.4–10.5)
Chloride: 113 mEq/L — ABNORMAL HIGH (ref 96–112)
Creatinine, Ser: 1.34 mg/dL (ref 0.50–1.35)
GFR calc Af Amer: 61 mL/min — ABNORMAL LOW (ref >90)
Glucose, Bld: 91 mg/dL (ref 70–99)
Sodium: 143 mEq/L (ref 135–145)

## 2013-07-06 LAB — COMPREHENSIVE METABOLIC PANEL
ALT: 32 U/L (ref 0–53)
AST: 40 U/L — ABNORMAL HIGH (ref 0–37)
Alkaline Phosphatase: 25 U/L — ABNORMAL LOW (ref 39–117)
BUN: 18 mg/dL (ref 6–23)
CO2: 18 mEq/L — ABNORMAL LOW (ref 19–32)
Calcium: 8 mg/dL — ABNORMAL LOW (ref 8.4–10.5)
Chloride: 115 mEq/L — ABNORMAL HIGH (ref 96–112)
GFR calc Af Amer: 69 mL/min — ABNORMAL LOW (ref >90)
GFR calc non Af Amer: 59 mL/min — ABNORMAL LOW (ref >90)
Glucose, Bld: 82 mg/dL (ref 70–99)
Potassium: 2.8 mEq/L — ABNORMAL LOW (ref 3.5–5.1)
Sodium: 146 mEq/L — ABNORMAL HIGH (ref 135–145)
Total Bilirubin: 0.4 mg/dL (ref 0.3–1.2)

## 2013-07-06 LAB — HEPARIN LEVEL (UNFRACTIONATED): Heparin Unfractionated: 0.67 IU/mL (ref 0.30–0.70)

## 2013-07-06 LAB — CBC
HCT: 35.1 % — ABNORMAL LOW (ref 39.0–52.0)
Hemoglobin: 11.7 g/dL — ABNORMAL LOW (ref 13.0–17.0)
MCH: 29.5 pg (ref 26.0–34.0)
MCHC: 34.8 g/dL (ref 30.0–36.0)
MCV: 84.6 fL (ref 78.0–100.0)
Platelets: 254 10*3/uL (ref 150–400)
RBC: 3.96 MIL/uL — ABNORMAL LOW (ref 4.22–5.81)
RBC: 4.15 MIL/uL — ABNORMAL LOW (ref 4.22–5.81)
RDW: 14 % (ref 11.5–15.5)
WBC: 5.3 10*3/uL (ref 4.0–10.5)

## 2013-07-06 LAB — PROTIME-INR: Prothrombin Time: 14.2 seconds (ref 11.6–15.2)

## 2013-07-06 SURGERY — LEFT HEART CATHETERIZATION WITH CORONARY ANGIOGRAM
Anesthesia: LOCAL

## 2013-07-06 SURGERY — LEFT HEART CATHETERIZATION WITH CORONARY ANGIOGRAM
Anesthesia: Moderate Sedation | Laterality: Left

## 2013-07-06 MED ORDER — HEPARIN (PORCINE) IN NACL 2-0.9 UNIT/ML-% IJ SOLN
INTRAMUSCULAR | Status: AC
  Filled 2013-07-06: qty 1000

## 2013-07-06 MED ORDER — SODIUM CHLORIDE 0.9 % IJ SOLN
3.0000 mL | Freq: Two times a day (BID) | INTRAMUSCULAR | Status: DC
  Administered 2013-07-06 – 2013-07-07 (×2): 3 mL via INTRAVENOUS

## 2013-07-06 MED ORDER — SODIUM CHLORIDE 0.9 % IJ SOLN: 3.0000 mL | INTRAMUSCULAR | Status: DC | PRN

## 2013-07-06 MED ORDER — ASPIRIN 81 MG PO CHEW: 81.0000 mg | CHEWABLE_TABLET | ORAL | Status: DC

## 2013-07-06 MED ORDER — HEPARIN SODIUM (PORCINE) 1000 UNIT/ML IJ SOLN
INTRAMUSCULAR | Status: AC
  Filled 2013-07-06: qty 1

## 2013-07-06 MED ORDER — MIDAZOLAM HCL 2 MG/2ML IJ SOLN
INTRAMUSCULAR | Status: AC
  Filled 2013-07-06: qty 2

## 2013-07-06 MED ORDER — LIDOCAINE HCL (PF) 1 % IJ SOLN
INTRAMUSCULAR | Status: AC
  Filled 2013-07-06: qty 30

## 2013-07-06 MED ORDER — VERAPAMIL HCL 2.5 MG/ML IV SOLN
INTRAVENOUS | Status: AC
  Filled 2013-07-06: qty 2

## 2013-07-06 MED ORDER — POTASSIUM CHLORIDE CRYS ER 20 MEQ PO TBCR: 40.0000 meq | EXTENDED_RELEASE_TABLET | ORAL | Status: DC

## 2013-07-06 MED ORDER — POTASSIUM CHLORIDE 20 MEQ/15ML (10%) PO LIQD
40.0000 meq | Freq: Once | ORAL | Status: AC
  Administered 2013-07-06: 40 meq via ORAL
  Filled 2013-07-06 (×2): qty 30

## 2013-07-06 MED ORDER — SODIUM CHLORIDE 0.9 % IV SOLN
1.0000 mL/kg/h | INTRAVENOUS | Status: DC
  Administered 2013-07-07: 1 mL/kg/h via INTRAVENOUS

## 2013-07-06 MED ORDER — POTASSIUM CHLORIDE CRYS ER 20 MEQ PO TBCR
30.0000 meq | EXTENDED_RELEASE_TABLET | Freq: Once | ORAL | Status: AC
  Administered 2013-07-06: 10:00:00 30 meq via ORAL
  Filled 2013-07-06 (×2): qty 1

## 2013-07-06 MED ORDER — SODIUM CHLORIDE 0.9 % IV SOLN: 250.0000 mL | INTRAVENOUS | Status: DC | PRN

## 2013-07-06 MED ORDER — NITROGLYCERIN 0.2 MG/ML ON CALL CATH LAB
INTRAVENOUS | Status: AC
  Filled 2013-07-06: qty 1

## 2013-07-06 NOTE — Progress Notes (Signed)
[facility] Progress Note [place] TEAM 1 - Stepdown/ICU TEAM   Eric Zamora ZOX:[id number] DOB: 05-08-1946 DOA: 07/02/2013 PCP: No primary provider on file.  Admit HPI / Brief Narrative: 67 y/o male with dementia and HOCM presented to [facility] 11/2 with several days of dyspnea. Noted to have NSTEMI, severe sepsis, pneumonia vs acute CHF exacerbation and ?acute abdomen. Stated that he had noted several days of dyspnea and cough. Family noted this had been going on ~1 week but pt had refused hospitalization. He is apparently stoic at baseline and had not noticed abdominal pain despite increasing distension. Also denied chest pain.  SIGNIFICANT EVENTS / STUDIES:  11/2: NSTEMI, likely demand from sepsis  11/2: CT abdomen>pneumonia v atelectasis, dilated bowel loops. Started BiPAP  11/3: To O2 by [place] 11/3 LE duplex > Rt peroneal DVT 11/5 - [facility] assumes care   Assessment/Plan:  Acute hypoxemic resp failure due to pneumonia vs pulmonary edema Respiratory status has rapidly improved suggesting pulmonary edema as probable etiology  Severe sepsis > ?severe CAP  Augmentin to complete therapy x 7ds as ordered by PCCM  NSTEMI likely from demand from sepsis lexiscan completed- reveals reversibility in the anteroseptal wall and a prior fixed defect- Cath performed- medical therapy recommended  Severe Hypokalemia Replace and follow closely   Ileus CT abdomen with dilated bowel, most likely adynamic ileus - C diff neg - now tolerating diet  Rt peroneal DVT recommend of anticoagulation therapyfor DVT - can change to lovenox once it is clear if he will need cardiac intervention or not  HTN Well-controlled at present  Obstructive Cardiomyopathy  Cardiology is following  Baseline dementia  Baseline bipolar disorder  Code Status: Limited code, no CPR, no mechanical ventilation, no shocks. Pressors and lines OK  Family Communication: no family present at time of  exam Disposition Plan: SDU  Consultants: Cardiology   Procedures: Lexiscan - 11/5 - pending   Antibiotics: 11/2 zosyn >>  11/2 azithromycin >> 11/3   DVT prophylaxis: IV heparin   HPI/Subjective: The patient is resting comfortably in bed after cardiac cath. Has no complaints of chest pain. Over all states he is feeling better.   Objective: Blood pressure 106/56, pulse 71, temperature 98.3 F (36.8 C), temperature source Oral, resp. rate 20, height 5\' 5"  (1.651 m), weight 64 kg (141 lb 1.5 oz), SpO2 100.00%.  Intake/Output Summary (Last 24 hours) at 07/06/13 1517 Last data filed at 07/06/13 1309  Gross per 24 hour  Intake   1435 ml  Output    950 ml  Net    485 ml   Exam: General: No acute respiratory distress at rest Lungs: Mild basilar crackles with no wheeze with good air movement throughout other fields Cardiovascular: Regular rate and rhythm without murmur gallop or rub normal S1 and S2 Abdomen: Nontender, nondistended, soft, bowel sounds positive, no rebound, no ascites, no appreciable mass Extremities: No significant cyanosis, clubbing, or edema bilateral lower extremities  Data Reviewed: Basic Metabolic Panel:  Recent Labs Lab 07/02/13 1226  07/04/13 0400 07/05/13 0407 07/05/13 1333 07/06/13 0430 07/06/13 1145  NA  --   < > 143 143 143 146* 143  K  --   < > 3.1* 2.1* 2.4* 2.8* 3.3*  CL  --   < > 112 112 110 115* 113*  CO2  --   < > 19 20 21  18* 20  GLUCOSE  --   < > 93 103* 141* 82 91  BUN  --   < >  27* 23 19 18 17   CREATININE  --   < > 1.57* 1.44* 1.35 1.22 1.34  CALCIUM  --   < > 8.1* 7.7* 8.2* 8.0* 8.3*  MG 2.2  --   --   --  2.1 2.0  --   < > = values in this interval not displayed. Liver Function Tests:  Recent Labs Lab 07/06/13 0430  AST 40*  ALT 32  ALKPHOS 25*  BILITOT 0.4  PROT 6.0  ALBUMIN 2.6*   No results found for this basename: LIPASE, AMYLASE,  in the last 168 hours No results found for this basename: AMMONIA,  in the last  168 hours CBC:  Recent Labs Lab 07/02/13 1124 07/04/13 0400 07/05/13 0407 07/06/13 0430 07/06/13 1053  WBC 11.0* 6.6 6.7 6.4 5.3  NEUTROABS 9.3*  --   --   --   --   HGB 13.4 12.0* 11.9* 11.7* 12.2*  HCT 36.8* 34.8* 33.8* 33.1* 35.1*  MCV 83.4 83.9 83.7 83.6 84.6  PLT 185 217 237 246 254   Cardiac Enzymes:  Recent Labs Lab 07/02/13 1125 07/03/13 0859  TROPONINI 6.06* 4.53*   BNP (last 3 results)  Recent Labs  07/02/13 1125  PROBNP 18591.0*   CBG:  Recent Labs Lab 07/02/13 1551  GLUCAP 131*    Recent Results (from the past 240 hour(s))  MRSA PCR SCREENING     Status: None   Collection Time    07/02/13  3:47 PM      Result Value Range Status   MRSA by PCR NEGATIVE  NEGATIVE Final   Comment:            The GeneXpert MRSA Assay (FDA     approved for NASAL specimens     only), is one component of a     comprehensive MRSA colonization     surveillance program. It is not     intended to diagnose MRSA     infection nor to guide or     monitor treatment for     MRSA infections.  URINE CULTURE     Status: None   Collection Time    07/02/13  3:48 PM      Result Value Range Status   Specimen Description URINE, CATHETERIZED   Final   Special Requests Normal   Final   Culture  Setup Time     Final   Value: 07/02/2013 21:49     Performed at [facility] Count     Final   Value: NO GROWTH     Performed at [facility]   Culture     Final   Value: NO GROWTH     Performed at [facility]   Report Status 07/03/2013 FINAL   Final  CULTURE, BLOOD (ROUTINE X 2)     Status: None   Collection Time    07/02/13  4:33 PM      Result Value Range Status   Specimen Description BLOOD LEFT ARM   Final   Special Requests BOTTLES DRAWN AEROBIC ONLY 3CC   Final   Culture  Setup Time     Final   Value: 07/02/2013 21:48     Performed at [facility]   Culture     Final   Value:        BLOOD CULTURE RECEIVED NO GROWTH TO DATE  CULTURE WILL BE HELD FOR 5 DAYS BEFORE ISSUING A FINAL NEGATIVE REPORT  Performed at [facility]   Report Status PENDING   Incomplete  CULTURE, BLOOD (ROUTINE X 2)     Status: None   Collection Time    07/02/13  4:38 PM      Result Value Range Status   Specimen Description BLOOD RIGHT ANTECUBITAL   Final   Special Requests BOTTLES DRAWN AEROBIC AND ANAEROBIC 10CC   Final   Culture  Setup Time     Final   Value: 07/02/2013 21:48     Performed at [facility]   Culture     Final   Value:        BLOOD CULTURE RECEIVED NO GROWTH TO DATE CULTURE WILL BE HELD FOR 5 DAYS BEFORE ISSUING A FINAL NEGATIVE REPORT     Performed at [facility]   Report Status PENDING   Incomplete  CLOSTRIDIUM DIFFICILE BY PCR     Status: None   Collection Time    07/03/13  2:01 PM      Result Value Range Status   C difficile by pcr NEGATIVE  NEGATIVE Final     Studies:  Recent x-ray studies have been reviewed in detail by the Attending Physician  Scheduled Meds:  Scheduled Meds: . antiseptic oral rinse  15 mL Mouth Rinse BID  . aspirin  81 mg Oral Daily  . famotidine  20 mg Oral BID  . furosemide  40 mg Oral Daily  . piperacillin-tazobactam (ZOSYN)  IV  3.375 g Intravenous Q8H  . sodium chloride  3 mL Intravenous Q12H    Time spent on care of this patient: 35 mins   Calvert Cantor, MD  [facility] Office  [phone] Pager - Text Page per Loretha Stapler as per below:  On-Call/Text Page:      [email or web address]      password [email or web address]  If 7PM-7AM, please contact night-coverage [email or web address] Password [email or web address] 07/06/2013, 3:17 PM   LOS: 4 days

## 2013-07-06 NOTE — Progress Notes (Signed)
eLink Physician-Brief Progress Note Patient Name: KANNON GRANDERSON DOB: [birth year] MRN: [id number]  Date of Service  07/06/2013   HPI/Events of Note   Hypokalemia on scheduled replacement  eICU Interventions  Additional doses ordered for 11/6 Follow BMP   Intervention Category Minor Interventions: Electrolytes abnormality - evaluation and management  Kateland Leisinger S. 07/06/2013, 6:44 AM

## 2013-07-06 NOTE — Progress Notes (Signed)
Physical Therapy Treatment Patient Details Name: TONY GRANQUIST MRN: [id number] DOB: 09-03-1945 Today's Date: 07/06/2013 Time: 9147-8295 PT Time Calculation (min): 25 min  PT Assessment / Plan / Recommendation  History of Present Illness 67 y/o male with dementia and HOCM presented to the [facility] ED on 11/2 with several days of dyspnea. Noted to have an NSTEMI, severe sepsis, pneumonia vs acute CHF exacerbation, and an acute abdomen.   PT Comments   Pt continues to progress well with mobility with use of RW. Pt very pleasant and eager to return home. Still no family present to state supervision level or PLOF.   Follow Up Recommendations  Home health PT;Supervision/Assistance - 24 hour     Does the patient have the potential to tolerate intense rehabilitation     Barriers to Discharge        Equipment Recommendations       Recommendations for Other Services    Frequency     Progress towards PT Goals Progress towards PT goals: Progressing toward goals  Plan Current plan remains appropriate    Precautions / Restrictions Precautions Precautions: Fall Precaution Comments: incontinent of stool   Pertinent Vitals/Pain No pain sats 98% on RA with gait HR 82-94    Mobility  Bed Mobility Bed Mobility: Supine to Sit Supine to Sit: 6: Modified independent (Device/Increase time);HOB flat Sitting - Scoot to Edge of Bed: 6: Modified independent (Device/Increase time) Transfers Sit to Stand: 5: Supervision;From bed Stand to Sit: 5: Supervision;To chair/3-in-1;To bed Details for Transfer Assistance: cueing for hand placement and assist to manage lines. pt incontinent of small amount of stool on arrival, provided pericare and used brief and pad to ambulate Ambulation/Gait Ambulation/Gait Assistance: 5: Supervision Ambulation Distance (Feet): 500 Feet Assistive device: Rolling walker Ambulation/Gait Assistance Details: cueing for position in RW initially, cues for direction and cues to  back to surface with RW prior to sitting. Pt stated he felt more steady with RW and agreeable to continued use Gait Pattern: Step-through pattern Gait velocity: decreased Stairs: No    Exercises General Exercises - Lower Extremity Long Arc Quad: AROM;Both;Seated;20 reps Hip Flexion/Marching: AROM;Both;Seated;20 reps   PT Diagnosis:    PT Problem List:   PT Treatment Interventions:     PT Goals (current goals can now be found in the care plan section)    Visit Information  Last PT Received On: 07/06/13 Assistance Needed: +1 History of Present Illness: 67 y/o male with dementia and HOCM presented to the [facility] ED on 11/2 with several days of dyspnea. Noted to have an NSTEMI, severe sepsis, pneumonia vs acute CHF exacerbation, and an acute abdomen.    Subjective Data      Cognition  Cognition Arousal/Alertness: Awake/alert Behavior During Therapy: Flat affect Overall Cognitive Status: No family/caregiver present to determine baseline cognitive functioning    Balance  Balance Balance Assessed: Yes Static Sitting Balance Static Sitting - Balance Support: Feet unsupported;No upper extremity supported Static Sitting - Level of Assistance: 7: Independent Static Sitting - Comment/# of Minutes: 5. pt able to don/doff socks in unsupported sitting without difficulty  End of Session PT - End of Session Equipment Utilized During Treatment: Gait belt Activity Tolerance: Patient tolerated treatment well Patient left: in chair;with call bell/phone within reach Nurse Communication: Mobility status   GP     Delorse Lek 07/06/2013, 8:11 AM Delaney Meigs, PT [phone]

## 2013-07-06 NOTE — H&P (View-Only) (Signed)
  DAILY PROGRESS NOTE  Subjective:  No events overnight. Denies chest pain. Renal function has improved to almost normal. Continues on heparin and zosyn (for presumed aspiration pneumonia). NSTEMI - troponin peaked at 6.06, then trending down.  Lexiscan NST yesterday showed anteroseptal ischemia and fixed inferior defects - with a preserved EF.  Objective:  Temp:  [98.1 F (36.7 C)-98.9 F (37.2 C)] 98.8 F (37.1 C) (11/06 0739) Pulse Rate:  [69-94] 94 (11/06 0807) Resp:  [16-26] 20 (11/06 0800) BP: (91-155)/(59-91) 149/82 mmHg (11/06 0800) SpO2:  [98 %-100 %] 98 % (11/06 0807) Weight change:   Intake/Output from previous day: 11/05 0701 - 11/06 0700 In: 1205 [P.O.:480; I.V.:275; IV Piggyback:450] Out: 1150 [Urine:1150]  Intake/Output from this shift: Total I/O In: 25 [I.V.:12.5; IV Piggyback:12.5] Out: -   Medications: Current Facility-Administered Medications  Medication Dose Route Frequency Provider Last Rate Last Dose  . albuterol (PROVENTIL) (5 MG/ML) 0.5% nebulizer solution 2.5 mg  2.5 mg Nebulization Q3H PRN Douglas B McQuaid, MD      . antiseptic oral rinse (BIOTENE) solution 15 mL  15 mL Mouth Rinse BID Douglas B McQuaid, MD   15 mL at 07/05/13 2000  . aspirin chewable tablet 81 mg  81 mg Oral Daily Douglas B McQuaid, MD   81 mg at 07/05/13 1030  . famotidine (PEPCID) tablet 20 mg  20 mg Oral BID Maria T Thekkekandam, MD   20 mg at 07/05/13 2211  . furosemide (LASIX) tablet 40 mg  40 mg Oral Daily Brittainy Simmons, PA-C   40 mg at 07/05/13 1030  . heparin ADULT infusion 100 units/mL (25000 units/250 mL)  1,250 Units/hr Intravenous Continuous Caron George Amend, RPH 12.5 mL/hr at 07/06/13 0224 1,250 Units/hr at 07/06/13 0224  . piperacillin-tazobactam (ZOSYN) IVPB 3.375 g  3.375 g Intravenous Q8H Jessica Brown Millen, RPH   3.375 g at 07/06/13 0602  . potassium chloride (K-DUR,KLOR-CON) CR tablet 30 mEq  30 mEq Oral Once Katherine P Schorr, NP        Physical  Exam: General appearance: alert and no distress Neck: no carotid bruit and no JVD Lungs: clear to auscultation bilaterally Heart: regular rate and rhythm, S1, S2 normal, no murmur, click, rub or gallop Abdomen: soft, non-tender; bowel sounds normal; no masses,  no organomegaly Extremities: extremities normal, atraumatic, no cyanosis or edema Pulses: 2+ and symmetric Skin: Skin color, texture, turgor normal. No rashes or lesions Neurologic: Mental status: Awake and oriented, follows commands Psych: Mood, affect normal  Lab Results: Results for orders placed during the hospital encounter of 07/02/13 (from the past 48 hour(s))  HEPARIN LEVEL (UNFRACTIONATED)     Status: None   Collection Time    07/05/13  4:07 AM      Result Value Range   Heparin Unfractionated 0.33  0.30 - 0.70 IU/mL   Comment:            IF HEPARIN RESULTS ARE BELOW     EXPECTED VALUES, AND PATIENT     DOSAGE HAS BEEN CONFIRMED,     SUGGEST FOLLOW UP TESTING     OF ANTITHROMBIN III LEVELS.  CBC     Status: Abnormal   Collection Time    07/05/13  4:07 AM      Result Value Range   WBC 6.7  4.0 - 10.5 K/uL   RBC 4.04 (*) 4.22 - 5.81 MIL/uL   Hemoglobin 11.9 (*) 13.0 - 17.0 g/dL   HCT 33.8 (*) 39.0 - 52.0 %     MCV 83.7  78.0 - 100.0 fL   MCH 29.5  26.0 - 34.0 pg   MCHC 35.2  30.0 - 36.0 g/dL   RDW 13.8  11.5 - 15.5 %   Platelets 237  150 - 400 K/uL  BASIC METABOLIC PANEL     Status: Abnormal   Collection Time    07/05/13  4:07 AM      Result Value Range   Sodium 143  135 - 145 mEq/L   Potassium 2.1 (*) 3.5 - 5.1 mEq/L   Comment: CRITICAL RESULT CALLED TO, READ BACK BY AND VERIFIED WITH:     N. WILSON RN [id number] GREEN R   Chloride 112  96 - 112 mEq/L   CO2 20  19 - 32 mEq/L   Glucose, Bld 103 (*) 70 - 99 mg/dL   BUN 23  6 - 23 mg/dL   Creatinine, Ser 1.44 (*) 0.50 - 1.35 mg/dL   Calcium 7.7 (*) 8.4 - 10.5 mg/dL   GFR calc non Af Amer 48 (*) >90 mL/min   GFR calc Af Amer 56 (*) >90 mL/min   Comment:  (NOTE)     The eGFR has been calculated using the CKD EPI equation.     This calculation has not been validated in all clinical situations.     eGFR's persistently <90 mL/min signify possible Chronic Kidney     Disease.  BASIC METABOLIC PANEL     Status: Abnormal   Collection Time    07/05/13  1:33 PM      Result Value Range   Sodium 143  135 - 145 mEq/L   Potassium 2.4 (*) 3.5 - 5.1 mEq/L   Comment: CRITICAL RESULT CALLED TO, READ BACK BY AND VERIFIED WITH:     JORDAN BRODY,RN AT 1437 07/05/13 BY ZBEECH.   Chloride 110  96 - 112 mEq/L   CO2 21  19 - 32 mEq/L   Glucose, Bld 141 (*) 70 - 99 mg/dL   BUN 19  6 - 23 mg/dL   Creatinine, Ser 1.35  0.50 - 1.35 mg/dL   Calcium 8.2 (*) 8.4 - 10.5 mg/dL   GFR calc non Af Amer 52 (*) >90 mL/min   GFR calc Af Amer 61 (*) >90 mL/min   Comment: (NOTE)     The eGFR has been calculated using the CKD EPI equation.     This calculation has not been validated in all clinical situations.     eGFR's persistently <90 mL/min signify possible Chronic Kidney     Disease.  MAGNESIUM     Status: None   Collection Time    07/05/13  1:33 PM      Result Value Range   Magnesium 2.1  1.5 - 2.5 mg/dL  HEPARIN LEVEL (UNFRACTIONATED)     Status: None   Collection Time    07/06/13  4:30 AM      Result Value Range   Heparin Unfractionated 0.67  0.30 - 0.70 IU/mL   Comment:            IF HEPARIN RESULTS ARE BELOW     EXPECTED VALUES, AND PATIENT     DOSAGE HAS BEEN CONFIRMED,     SUGGEST FOLLOW UP TESTING     OF ANTITHROMBIN III LEVELS.  CBC     Status: Abnormal   Collection Time    07/06/13  4:30 AM      Result Value Range   WBC 6.4  4.0 - 10.5 K/uL     RBC 3.96 (*) 4.22 - 5.81 MIL/uL   Hemoglobin 11.7 (*) 13.0 - 17.0 g/dL   HCT 33.1 (*) 39.0 - 52.0 %   MCV 83.6  78.0 - 100.0 fL   MCH 29.5  26.0 - 34.0 pg   MCHC 35.3  30.0 - 36.0 g/dL   RDW 13.8  11.5 - 15.5 %   Platelets 246  150 - 400 K/uL  COMPREHENSIVE METABOLIC PANEL     Status: Abnormal    Collection Time    07/06/13  4:30 AM      Result Value Range   Sodium 146 (*) 135 - 145 mEq/L   Potassium 2.8 (*) 3.5 - 5.1 mEq/L   Chloride 115 (*) 96 - 112 mEq/L   CO2 18 (*) 19 - 32 mEq/L   Glucose, Bld 82  70 - 99 mg/dL   BUN 18  6 - 23 mg/dL   Creatinine, Ser 1.22  0.50 - 1.35 mg/dL   Calcium 8.0 (*) 8.4 - 10.5 mg/dL   Total Protein 6.0  6.0 - 8.3 g/dL   Albumin 2.6 (*) 3.5 - 5.2 g/dL   AST 40 (*) 0 - 37 U/L   ALT 32  0 - 53 U/L   Alkaline Phosphatase 25 (*) 39 - 117 U/L   Total Bilirubin 0.4  0.3 - 1.2 mg/dL   GFR calc non Af Amer 59 (*) >90 mL/min   GFR calc Af Amer 69 (*) >90 mL/min   Comment: (NOTE)     The eGFR has been calculated using the CKD EPI equation.     This calculation has not been validated in all clinical situations.     eGFR's persistently <90 mL/min signify possible Chronic Kidney     Disease.  MAGNESIUM     Status: None   Collection Time    07/06/13  4:30 AM      Result Value Range   Magnesium 2.0  1.5 - 2.5 mg/dL    Imaging: Dg Chest 2 View  07/04/2013   CLINICAL DATA:  Increased heart rate with movement of any kind  EXAM: CHEST  2 VIEW  COMPARISON:  07/02/2013  FINDINGS: Heart size upper normal and stable. Vascular pattern normal. Mild medial right lung base infiltrate. Subtle diffuse hazy opacity over the left lung consistent with resolving infiltrate seen on prior study. Small bilateral effusions.  IMPRESSION: Improved aeration on the left with residual but markedly improved diffuse infiltrate. Minimal infiltrate in the right base, which is increased from the prior study.   Electronically Signed   By: Raymond  Rubner M.D.   On: 07/04/2013 10:06   Dg Abd 1 View  07/04/2013   CLINICAL DATA:  Bowel dilatation, bloody urine, diarrhea, abdominal distention, history adynamic ileus and sepsis  EXAM: ABDOMEN - 1 VIEW  COMPARISON:  CT abdomen and pelvis 07/02/2013  FINDINGS: Air-filled nonspecific bowel loops are seen in the mid abdomen common minimally  distended.  These are relatively featureless.  No definite bowel wall thickening identified.  Bilateral pelvic phleboliths.  No acute osseous abnormalities or definite urinary tract obstruction.  IMPRESSION: Air-filled loops of bowel in the mid abdomen, nonspecific but favor ileus.   Electronically Signed   By: Mark  Boles M.D.   On: 07/04/2013 10:24   Nm Myocar Multi W/spect W/wall Motion / Ef  07/05/2013   CLINICAL DATA:  67-year-old with NSTEMI.  EXAM: MYOCARDIAL IMAGING WITH SPECT (REST AND PHARMACOLOGIC-STRESS)  GATED LEFT VENTRICULAR WALL MOTION STUDY  LEFT VENTRICULAR EJECTION   FRACTION  TECHNIQUE: Standard myocardial SPECT imaging performed after resting intravenous injection of 10 mCi Tc-99m sestimibi. Subsequently, intravenous infusion of regadenoson performed under the supervision of the Cardiology staff. At peak effect of the drug, 30 mCi Tc-99m sestimibi injected intravenously and standard myocardial SPECT imaging performed. Quantitative gated imaging also performed to evaluate left ventricular wall motion and estimate left ventricular ejection fraction.  FINDINGS: MYOCARDIAL IMAGING WITH SPECT (REST AND PHARMACOLOGIC-STRESS)  There is concern for reversibility along the anteroseptal wall at the apex and mid segment. There is a fixed defect along the inferior wall.  GATED LEFT VENTRICULAR WALL MOTION STUDY  Review of the gated images demonstrates hypokinesis in the septal wall and some hypokinesis in the inferior wall.  LEFT VENTRICULAR EJECTION FRACTION  QGS ejection fraction measures 54% , with an end-diastolic volume of 118 ml and an end-systolic volume of 54 ml.  IMPRESSION: Concern for reversibility and pharmacological induced ischemia along the anteroseptal wall. In addition, there is hypokinesia in the septal wall.  Fixed defect in the inferior wall with hypokinesia. Findings could represent prior infarct and/or diaphragm attenuation.  These results will be called to the ordering clinician or  representative by the Radiologist Assistant, and communication documented in the PACS Dashboard.   Electronically Signed   By: Adam  Henn M.D.   On: 07/05/2013 14:50   Dg Chest Port 1 View  07/06/2013   CLINICAL DATA:  Evaluate possible pulmonary infiltrates  EXAM: PORTABLE CHEST - 1 VIEW  COMPARISON:  07/04/2013; 07/02/2013; 08/03/2008  FINDINGS: Grossly unchanged borderline enlarged cardiac silhouette and mediastinal contours with nodular prominence of the right pulmonary hilum. Interval resolution of left upper and mid lung heterogeneous airspace opacities. A granuloma overlies the left mid lung. No new focal airspace opacities. No pleural effusion or pneumothorax. No evidence of edema. Unchanged bones.  IMPRESSION: 1. Interval resolution of left upper and mid lung heterogeneous airspace opacities which given rapid resolution suggests results asymmetric pulmonary edema, less likely, infection or aspiration.  2. Nodular prominence of the right pulmonary hila, possibly prominence of the right pulmonary vasculature though hilar adenopathy may have a similar appearance. Further evaluation with chest CT may be performed as clinically indicated.  This was made a call report.   Electronically Signed   By: John  Watts M.D.   On: 07/06/2013 08:06    Assessment:  1. Principal Problem: 2.   Acute respiratory failure 3. Active Problems: 4.   NSTEMI (non-ST elevated myocardial infarction) 5.   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   HTN (hypertension) 7.   Bipolar disorder (manic depression) 8.   Dyslipidemia 9.   Acute on chronic renal insufficiency 10.   Hypokalemia 11.   Severe sepsis 12.   Adynamic ileus 13.   Left leg DVT 14.   Problem with Foley catheter 15.   Plan:  1. Mr. Yabut has improved rapidly - renal function is now in normal range. He continues on heparin for NSTEMI with troponin peak of 6. His stress test shows anteroseptal reversibility and a fixed inferior  defect with preserved LVEF - Echo showed EF 55-60% without wall motion abnormalities.  I do think there is underlying coronary disease that was responsible for a Type II MI in the setting of hemodynamic strain from possible aspiration or other stressors. I would recommend LHC with possible PCI later today with Dr. Harding. Have contacted the family to get consent for the procedure - she will be in this morning.  Keep   NPO for cath this afternoon.  Time Spent Directly with Patient:  15 minutes  Length of Stay:  LOS: 4 days   Chiniqua Kilcrease C. Zamariya Neal, MD, FACC Attending Cardiologist [facility]  Liahm Grivas C 07/06/2013, 9:26 AM     

## 2013-07-06 NOTE — Progress Notes (Signed)
Dr Herbie Baltimore at bedside to speak with sister. Sister and pt viewed cath video. Sister denies further questions and signed consent for cath.

## 2013-07-06 NOTE — Progress Notes (Signed)
DAILY PROGRESS NOTE  Subjective:  No events overnight. Denies chest pain. Renal function has improved to almost normal. Continues on heparin and zosyn (for presumed aspiration pneumonia). NSTEMI - troponin peaked at 6.06, then trending down.  Lexiscan NST yesterday showed anteroseptal ischemia and fixed inferior defects - with a preserved EF.  Objective:  Temp:  [98.1 F (36.7 C)-98.9 F (37.2 C)] 98.8 F (37.1 C) (11/06 0739) Pulse Rate:  [69-94] 94 (11/06 0807) Resp:  [16-26] 20 (11/06 0800) BP: (91-155)/(59-91) 149/82 mmHg (11/06 0800) SpO2:  [98 %-100 %] 98 % (11/06 0807) Weight change:   Intake/Output from previous day: 11/05 0701 - 11/06 0700 In: 1205 [P.O.:480; I.V.:275; IV Piggyback:450] Out: 1150 [Urine:1150]  Intake/Output from this shift: Total I/O In: 25 [I.V.:12.5; IV Piggyback:12.5] Out: -   Medications: Current Facility-Administered Medications  Medication Dose Route Frequency Provider Last Rate Last Dose  . albuterol (PROVENTIL) (5 MG/ML) 0.5% nebulizer solution 2.5 mg  2.5 mg Nebulization Q3H PRN Lupita Leash, MD      . antiseptic oral rinse (BIOTENE) solution 15 mL  15 mL Mouth Rinse BID Lupita Leash, MD   15 mL at 07/05/13 2000  . aspirin chewable tablet 81 mg  81 mg Oral Daily Lupita Leash, MD   81 mg at 07/05/13 1030  . famotidine (PEPCID) tablet 20 mg  20 mg Oral BID Leona Singleton, MD   20 mg at 07/05/13 2211  . furosemide (LASIX) tablet 40 mg  40 mg Oral Daily Brittainy Simmons, PA-C   40 mg at 07/05/13 1030  . heparin ADULT infusion 100 units/mL (25000 units/250 mL)  1,250 Units/hr Intravenous Continuous Hilario Quarry Amend, RPH 12.5 mL/hr at 07/06/13 0224 1,250 Units/hr at 07/06/13 0224  . piperacillin-tazobactam (ZOSYN) IVPB 3.375 g  3.375 g Intravenous Q8H Gala Lewandowsky Hillburn, RPH   3.375 g at 07/06/13 0602  . potassium chloride (K-DUR,KLOR-CON) CR tablet 30 mEq  30 mEq Oral Once Leanne Chang, NP        Physical  Exam: General appearance: alert and no distress Neck: no carotid bruit and no JVD Lungs: clear to auscultation bilaterally Heart: regular rate and rhythm, S1, S2 normal, no murmur, click, rub or gallop Abdomen: soft, non-tender; bowel sounds normal; no masses,  no organomegaly Extremities: extremities normal, atraumatic, no cyanosis or edema Pulses: 2+ and symmetric Skin: Skin color, texture, turgor normal. No rashes or lesions Neurologic: Mental status: Awake and oriented, follows commands Psych: Mood, affect normal  Lab Results: Results for orders placed during the hospital encounter of 07/02/13 (from the past 48 hour(s))  HEPARIN LEVEL (UNFRACTIONATED)     Status: None   Collection Time    07/05/13  4:07 AM      Result Value Range   Heparin Unfractionated 0.33  0.30 - 0.70 IU/mL   Comment:            IF HEPARIN RESULTS ARE BELOW     EXPECTED VALUES, AND PATIENT     DOSAGE HAS BEEN CONFIRMED,     SUGGEST FOLLOW UP TESTING     OF ANTITHROMBIN III LEVELS.  CBC     Status: Abnormal   Collection Time    07/05/13  4:07 AM      Result Value Range   WBC 6.7  4.0 - 10.5 K/uL   RBC 4.04 (*) 4.22 - 5.81 MIL/uL   Hemoglobin 11.9 (*) 13.0 - 17.0 g/dL   HCT 81.1 (*) 91.4 - 78.2 %  MCV 83.7  78.0 - 100.0 fL   MCH 29.5  26.0 - 34.0 pg   MCHC 35.2  30.0 - 36.0 g/dL   RDW 19.1  47.8 - 29.5 %   Platelets 237  150 - 400 K/uL  BASIC METABOLIC PANEL     Status: Abnormal   Collection Time    07/05/13  4:07 AM      Result Value Range   Sodium 143  135 - 145 mEq/L   Potassium 2.1 (*) 3.5 - 5.1 mEq/L   Comment: CRITICAL RESULT CALLED TO, READ BACK BY AND VERIFIED WITH:     Feliciana Rossetti RN [phone] GREEN R   Chloride 112  96 - 112 mEq/L   CO2 20  19 - 32 mEq/L   Glucose, Bld 103 (*) 70 - 99 mg/dL   BUN 23  6 - 23 mg/dL   Creatinine, Ser 4.69 (*) 0.50 - 1.35 mg/dL   Calcium 7.7 (*) 8.4 - 10.5 mg/dL   GFR calc non Af Amer 48 (*) >90 mL/min   GFR calc Af Amer 56 (*) >90 mL/min   Comment:  (NOTE)     The eGFR has been calculated using the CKD EPI equation.     This calculation has not been validated in all clinical situations.     eGFR's persistently <90 mL/min signify possible Chronic Kidney     Disease.  BASIC METABOLIC PANEL     Status: Abnormal   Collection Time    07/05/13  1:33 PM      Result Value Range   Sodium 143  135 - 145 mEq/L   Potassium 2.4 (*) 3.5 - 5.1 mEq/L   Comment: CRITICAL RESULT CALLED TO, READ BACK BY AND VERIFIED WITH:     [place] BRODY,RN AT 1437 07/05/13 BY ZBEECH.   Chloride 110  96 - 112 mEq/L   CO2 21  19 - 32 mEq/L   Glucose, Bld 141 (*) 70 - 99 mg/dL   BUN 19  6 - 23 mg/dL   Creatinine, Ser 6.29  0.50 - 1.35 mg/dL   Calcium 8.2 (*) 8.4 - 10.5 mg/dL   GFR calc non Af Amer 52 (*) >90 mL/min   GFR calc Af Amer 61 (*) >90 mL/min   Comment: (NOTE)     The eGFR has been calculated using the CKD EPI equation.     This calculation has not been validated in all clinical situations.     eGFR's persistently <90 mL/min signify possible Chronic Kidney     Disease.  MAGNESIUM     Status: None   Collection Time    07/05/13  1:33 PM      Result Value Range   Magnesium 2.1  1.5 - 2.5 mg/dL  HEPARIN LEVEL (UNFRACTIONATED)     Status: None   Collection Time    07/06/13  4:30 AM      Result Value Range   Heparin Unfractionated 0.67  0.30 - 0.70 IU/mL   Comment:            IF HEPARIN RESULTS ARE BELOW     EXPECTED VALUES, AND PATIENT     DOSAGE HAS BEEN CONFIRMED,     SUGGEST FOLLOW UP TESTING     OF ANTITHROMBIN III LEVELS.  CBC     Status: Abnormal   Collection Time    07/06/13  4:30 AM      Result Value Range   WBC 6.4  4.0 - 10.5 K/uL  RBC 3.96 (*) 4.22 - 5.81 MIL/uL   Hemoglobin 11.7 (*) 13.0 - 17.0 g/dL   HCT 16.1 (*) 09.6 - 04.5 %   MCV 83.6  78.0 - 100.0 fL   MCH 29.5  26.0 - 34.0 pg   MCHC 35.3  30.0 - 36.0 g/dL   RDW 40.9  81.1 - 91.4 %   Platelets 246  150 - 400 K/uL  COMPREHENSIVE METABOLIC PANEL     Status: Abnormal    Collection Time    07/06/13  4:30 AM      Result Value Range   Sodium 146 (*) 135 - 145 mEq/L   Potassium 2.8 (*) 3.5 - 5.1 mEq/L   Chloride 115 (*) 96 - 112 mEq/L   CO2 18 (*) 19 - 32 mEq/L   Glucose, Bld 82  70 - 99 mg/dL   BUN 18  6 - 23 mg/dL   Creatinine, Ser 7.82  0.50 - 1.35 mg/dL   Calcium 8.0 (*) 8.4 - 10.5 mg/dL   Total Protein 6.0  6.0 - 8.3 g/dL   Albumin 2.6 (*) 3.5 - 5.2 g/dL   AST 40 (*) 0 - 37 U/L   ALT 32  0 - 53 U/L   Alkaline Phosphatase 25 (*) 39 - 117 U/L   Total Bilirubin 0.4  0.3 - 1.2 mg/dL   GFR calc non Af Amer 59 (*) >90 mL/min   GFR calc Af Amer 69 (*) >90 mL/min   Comment: (NOTE)     The eGFR has been calculated using the CKD EPI equation.     This calculation has not been validated in all clinical situations.     eGFR's persistently <90 mL/min signify possible Chronic Kidney     Disease.  MAGNESIUM     Status: None   Collection Time    07/06/13  4:30 AM      Result Value Range   Magnesium 2.0  1.5 - 2.5 mg/dL    Imaging: Dg Chest 2 View  07/04/2013   CLINICAL DATA:  Increased heart rate with movement of any kind  EXAM: CHEST  2 VIEW  COMPARISON:  07/02/2013  FINDINGS: Heart size upper normal and stable. Vascular pattern normal. Mild medial right lung base infiltrate. Subtle diffuse hazy opacity over the left lung consistent with resolving infiltrate seen on prior study. Small bilateral effusions.  IMPRESSION: Improved aeration on the left with residual but markedly improved diffuse infiltrate. Minimal infiltrate in the right base, which is increased from the prior study.   Electronically Signed   By: Esperanza Heir M.D.   On: 07/04/2013 10:06   Dg Abd 1 View  07/04/2013   CLINICAL DATA:  Bowel dilatation, bloody urine, diarrhea, abdominal distention, history adynamic ileus and sepsis  EXAM: ABDOMEN - 1 VIEW  COMPARISON:  CT abdomen and pelvis 07/02/2013  FINDINGS: Air-filled nonspecific bowel loops are seen in the mid abdomen common minimally  distended.  These are relatively featureless.  No definite bowel wall thickening identified.  Bilateral pelvic phleboliths.  No acute osseous abnormalities or definite urinary tract obstruction.  IMPRESSION: Air-filled loops of bowel in the mid abdomen, nonspecific but favor ileus.   Electronically Signed   By: Ulyses Southward M.D.   On: 07/04/2013 10:24   Nm Myocar Multi W/spect W/wall Motion / Ef  07/05/2013   CLINICAL DATA:  67 year old with NSTEMI.  EXAM: MYOCARDIAL IMAGING WITH SPECT (REST AND PHARMACOLOGIC-STRESS)  GATED LEFT VENTRICULAR WALL MOTION STUDY  LEFT VENTRICULAR EJECTION  FRACTION  TECHNIQUE: Standard myocardial SPECT imaging performed after resting intravenous injection of 10 mCi Tc-17m sestimibi. Subsequently, intravenous infusion of regadenoson performed under the supervision of the Cardiology staff. At peak effect of the drug, 30 mCi Tc-44m sestimibi injected intravenously and standard myocardial SPECT imaging performed. Quantitative gated imaging also performed to evaluate left ventricular wall motion and estimate left ventricular ejection fraction.  FINDINGS: MYOCARDIAL IMAGING WITH SPECT (REST AND PHARMACOLOGIC-STRESS)  There is concern for reversibility along the anteroseptal wall at the apex and mid segment. There is a fixed defect along the inferior wall.  GATED LEFT VENTRICULAR WALL MOTION STUDY  Review of the gated images demonstrates hypokinesis in the septal wall and some hypokinesis in the inferior wall.  LEFT VENTRICULAR EJECTION FRACTION  QGS ejection fraction measures 54% , with an end-diastolic volume of 118 ml and an end-systolic volume of 54 ml.  IMPRESSION: Concern for reversibility and pharmacological induced ischemia along the anteroseptal wall. In addition, there is hypokinesia in the septal wall.  Fixed defect in the inferior wall with hypokinesia. Findings could represent prior infarct and/or diaphragm attenuation.  These results will be called to the ordering clinician or  representative by the Radiologist Assistant, and communication documented in the PACS Dashboard.   Electronically Signed   By: Richarda Overlie M.D.   On: 07/05/2013 14:50   Dg Chest Port 1 View  07/06/2013   CLINICAL DATA:  Evaluate possible pulmonary infiltrates  EXAM: PORTABLE CHEST - 1 VIEW  COMPARISON:  07/04/2013; 07/02/2013; 08/03/2008  FINDINGS: Grossly unchanged borderline enlarged cardiac silhouette and mediastinal contours with nodular prominence of the right pulmonary hilum. Interval resolution of left upper and mid lung heterogeneous airspace opacities. A granuloma overlies the left mid lung. No new focal airspace opacities. No pleural effusion or pneumothorax. No evidence of edema. Unchanged bones.  IMPRESSION: 1. Interval resolution of left upper and mid lung heterogeneous airspace opacities which given rapid resolution suggests results asymmetric pulmonary edema, less likely, infection or aspiration.  2. Nodular prominence of the right pulmonary hila, possibly prominence of the right pulmonary vasculature though hilar adenopathy may have a similar appearance. Further evaluation with chest CT may be performed as clinically indicated.  This was made a call report.   Electronically Signed   By: Simonne Come M.D.   On: 07/06/2013 08:06    Assessment:  1. Principal Problem: 2.   Acute respiratory failure 3. Active Problems: 4.   NSTEMI (non-ST elevated myocardial infarction) 5.   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   HTN (hypertension) 7.   Bipolar disorder (manic depression) 8.   Dyslipidemia 9.   Acute on chronic renal insufficiency 10.   Hypokalemia 11.   Severe sepsis 12.   Adynamic ileus 13.   Left leg DVT 14.   Problem with Foley catheter 15.   Plan:  1. Eric Zamora has improved rapidly - renal function is now in normal range. He continues on heparin for NSTEMI with troponin peak of 6. His stress test shows anteroseptal reversibility and a fixed inferior  defect with preserved LVEF - Echo showed EF 55-60% without wall motion abnormalities.  I do think there is underlying coronary disease that was responsible for a Type II MI in the setting of hemodynamic strain from possible aspiration or other stressors. I would recommend LHC with possible PCI later today with Dr. Herbie Baltimore. Have contacted the family to get consent for the procedure - she will be in this morning.  Keep  NPO for cath this afternoon.  Time Spent Directly with Patient:  15 minutes  Length of Stay:  LOS: 4 days   Chrystie Nose, MD, [facility] Attending Cardiologist [facility]  Steward Sames C 07/06/2013, 9:26 AM

## 2013-07-06 NOTE — CV Procedure (Signed)
CARDIAC CATHETERIZATION REPORT  NAME:  Eric Zamora   MRN: [id number] DOB:  1945/11/04   ADMIT DATE: 07/02/2013 Procedure Date: 07/06/2013  INTERVENTIONAL CARDIOLOGIST: Marykay Lex, M.D., MS PRIMARY CARE PROVIDER: No primary provider on file. PRIMARY CARDIOLOGIST: Hilty, Kenneth C. ([place]), MD  PATIENT:  Eric Zamora is a 67 y.o. male with a history of hypertrophic obstructive cardiomyopathy (mostly as basal septal hypertrophy) with hypertension and chronic kidney disease. He was admitted for appeared to be possible aspiration pneumonia versus CHF.  He also had a partial small bowel ileus. Cardiac enzymes were checked upon initial evaluation he was found to have positive troponins consistent with myocardial ischemia/infarct. Emergency room evaluation his ECG suggested possible STEMI, however when he was seen by Dr. Rennis Golden exertion, the findings are more consistent with LVH. He was initially placed on BiPAP, and has been on antibiotics. His initial acute on chronic renal insufficiency has been much resolved. He underwent Myoview stress test yesterday at 4 cardiac risk stratification that showed possible anterior apical ischemia with what appears to be an inferior infarct. He was seen by Dr. Rennis Golden this morning and felt that these findings wanted invasive evaluation with cardiac catheterization.  PRE-OPERATIVE DIAGNOSIS:    NSTEMI  ABNORMAL MYOVIEW STRESS TEST, INTERMEDIATE RISK (ANTEROAPICAL ISCHEMIA, INFERIOR SCAR  PROCEDURES PERFORMED:    LEFT HEART CATHETERIZATION WITH CORONARY ANGIOGRAPHY  PROCEDURE:Consent:  Risks of procedure as well as the alternatives and risks of each were explained to the (patient/caregiver).  Consent for procedure obtained. his sister, as next of kin provided consent, as Dr. Rennis Golden, salt and his sister all felt as though he does not have capacity to understand and provided consent. Consent for signed by MD and patient with RN witness -- placed on  chart.   PROCEDURE: The patient was brought to the 2nd Floor [facility] in the fasting state and prepped and draped in the usual sterile fashion for Right groin or radial access. A modified Allen's test with plethysmography was performed, revealing excellent Ulnar artery collateral flow.  Sterile technique was used including antiseptics, cap, gloves, gown, hand hygiene, mask and sheet.  Skin prep: Chlorhexidine.  Time Out: Verified patient identification, verified procedure, site/side was marked, verified correct patient position, special equipment/implants available, medications/allergies/relevent history reviewed, required imaging and test results available.  Performed  Access: Right Radial Artery; 6 Fr Sheath --  Seldinger technique (Angiocath Micropuncture Kit)  IA Radial Cocktail, IV Heparin administered Diagnostic:  TIG 4.0, Angled Pigtail catheters advanced and exchanged over long exchange safety J-wire.  Left and Right Coronary Artery Angiography: TIG 4.0  LV Hemodynamics (LV Gram): Angled pigtail  TR Band:  1450 Hours, 13 mL air  MEDICATIONS:  Anesthesia:  Local Lidocaine 2 ml  Sedation:  1 mg IV Versed, 0 mcg IV fentanyl ;   Omnipaque Contrast: 80 ml  Anticoagulation:  IV Heparin 4000 Units  Radial Cocktail: 5 mg Verapamil, 400 mcg NTG, 2 ml 2% Lidocaine in 10 ml NS  Hemodynamics:  Central Aortic / Mean Pressures: 94/49 mmHg; 70 mmHg  Left Ventricular Pressures / EDP: 97/10 mmHg; 24 mmHg (mid chamber); apical 137/5 mmHg   Left Ventriculography:  EF: 55 %  Wall Motion: no obvious regional WMA  Coronary Anatomy:  Left Main: Large Caliber Vessel that bifurcates into the LAD & Circumflex, but also gives off a tandem Ramus Intermedius vessel. LAD: Large caliber vessel with several mild luminal irregularities proximally, but most notably with a ~50-60% focal stenosis  just after SP1 & before the major first Diagonal (D1).  This is followed by  a log tubular ~30% stenosis before normalizing after another SP trunk.  The vessel then continues with minimal luminal irregularties around the apex.  D1: Moderate to large caliber vessel that comes off of the mid LAD just aftet the focal stenosis.  There is an ostial ~70% stenosis (worse in steep RAO Cranial).  Left Circumflex: Large Caliber vessel that bifurcates in the mid vessel into a Large Lateral OM1 and the AV Groove branch that bifurcates distally into LPL1 & 2.  OM1:  Large caliber, tortuous vessel that reaches out to the inferolateral apex; minimal luminal irregularities.  Ramus intermedius: Tandem vessels. The more Diagonal branch is moderate in caliber, while the more OM branch is small.  Angiographically normal.  RCA: Large caliber dominant vessel that has several major bends and is tortuous with 2 RV marginal branches.  It bifurcates distally into the Right Posterior Descending Artery (RPDA) & the  Right Posterior AV Groove Branch (RPAV).  Minimal luminal irregularities.  RPDA: Moderate caliber vessel that bifurcates distally.  Ostial ~40%.  RPL Sysytem:The RPAV is a large caliber vessel that gives off 2 small RPL1 & 3 vessels with a moderate to large caliber RPL2.  Angiographically normal.  PATIENT DISPOSITION:    The patient was transferred to the PACU holding area in a hemodynamicaly stable, chest pain free condition.  The patient tolerated the procedure well, and there were no complications.  EBL:   < 10 ml  The patient was stable before, during, and after the procedure.  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 Based on the ostial nature of this stenosis that is correlating with the LAD lesion, this is not a great stent-type PCI option. Scoring are Cutting Balloon angioplasty would be the only option for this vessel, which would not guarantee an adequate result, and could  potentially lead jeopardizing the LAD and lead to a stent being placed in the LAD that is not required.  Well-preserved LV EF with normal EDP.  PLAN OF CARE:  At this point, the patient has not had any evidence of angina, and has not been on aggressive medical therapy. Due to the risk of potentially jeopardizing the LAD, my recommendation would be to opt for aggressive medical therapy as opposed to PTCA of this ostial diagonal lesion.  He'll be returned to the TCU for post catheterization care. I would consider adding Plavix for short term therapy in the setting of existing coronary disease.   Once liver function is stable, would consider statin therapy.  Once blood pressure stable, would consider reinitiating beta blocker, and potentially long-acting nitrate.  I discussed the results with Dr. Rennis Golden. If the patient fails medical therapy, I would then consider intervention on the diagonal branch.   Marykay Lex, M.D., M.S. [facility] [address] [place], [place]  [address]  [phone]  07/06/2013 3:08 PM

## 2013-07-06 NOTE — Progress Notes (Signed)
ANTICOAGULATION CONSULT NOTE - Follow Up  Pharmacy Consult for Heparin  Indication: chest pain/ACS; DVT in peroneal vein  No Known Allergies  Patient Measurements: Height: 5\' 5"  (165.1 cm) Weight: 140 lb 14 oz (63.9 kg) IBW/kg (Calculated) : 61.5 Heparin Dosing Weight: 64 kg  Vital Signs: Temp: 98.8 F (37.1 C) (11/06 0739) Temp src: Oral (11/06 0739) BP: 149/82 mmHg (11/06 0800) Pulse Rate: 94 (11/06 0807) Labs:  Recent Labs  07/04/13 0400 07/05/13 0407 07/05/13 1333 07/06/13 0430  HGB 12.0* 11.9*  --  11.7*  HCT 34.8* 33.8*  --  33.1*  PLT 217 237  --  246  HEPARINUNFRC 0.40 0.33  --  0.67  CREATININE 1.57* 1.44* 1.35 1.22   Estimated Creatinine Clearance: 50.4 ml/min (by C-G formula based on Cr of 1.22).   Medical History: Past Medical History  Diagnosis Date  . HTN (hypertension)   . 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 Dyslipidemia   . Bipolar affective disorder   . Chronic renal insufficiency   . Dementia     Assessment: 67 y.o. M with cardiac history significant for HOCM who [facility] with fevers, diarrhea, and cough and was noted to have an elevated troponin. Pharmacy was consulted to start heparin for NSTEMI while awaiting further cardiology work-up -- to get cardiac cath when more stable. Pt also noted to have DVT in R peroneal vein on venous duplex 11/3 - will need long-term anticoagulation post cath.  Events: stress tests revealed CAD and cardiology plans for cath.    Anticoagulation: NSTEMI- DVT in R peroneal vein on venous duplex 11/3 :: IV heparin.  At goal, after cath will need long term anticoag; H/H low and stable. Plts wnl  Infectious Disease: Severe sepsis (PNA vs CHF vs acute abd). LA 2.1.; afeb, wbc wnl  Zosyn 11/2>> x1 dose. Restarted 11/3 >> Azith 11/2>>11/3  11/2 Blood >> neg 11/2 Urine >> neg 11/2 MRSA PCR negative  Cardiovascular: HOCM, New NSTEMI, Cardiac cath when more stable.  Trop 6.06 > 4.53 Last ECHO in 2010 (MR, TR; EF ~50%). QTc -600>>459 on EKG (d/c azithro), No ICD. Meds: ASA., (metop 12.5 BID) dcd  BP 70-140s, NSR. HR 80s.   Endocrinology: CBGs wnl.  Gastrointestinal / Nutrition: Acute abd- CT>>diffuse gaseous distention consistent w/ adynamic ileus (no mass or abscess). LFTs? PPI changed to Pepcid. NPO. Diarrhea >> r/o Cdiff.  Neurology:Dementia, Bipolar disorder BL. No pain. RASS 0.   Nephrology:  EstCrCl~45-50. K consistently low.  Pulmonary: Acute hypoxemic resp fx (pulm edema vs PNA). Alb nebs prn. Now 100% on RA  PTA Medication Issues: metoprolol 12.5 BID, simvastatin 10 qhs Best Practices: MC, Heparin, Pepcid    Goal of Therapy:  Heparin level 0.3-0.7 units/ml Monitor platelets by anticoagulation protocol: Yes   Plan:  1. Continue IV heparin at current rate.  Continue daily heparin level and CBC. 2. F/u plans for cath and restart of heparin and long term anticoagulant for DVT. 3. Follow-up for signs and symptoms of bleeding.     Thank you for allowing pharmacy to be a part of this patients care team.  Lovenia Kim Pharm.D., BCPS Clinical Pharmacist 07/06/2013 9:50 AM Pager: [phone] Phone: [phone]

## 2013-07-06 NOTE — Progress Notes (Signed)
Covering Clinical Child psychotherapist (CSW) visited pt room to explore mental status/provide bed offers. Pt appeared alert and oriented however very difficult to understand. Initially pt informed CSW he did not want his sister [name] involved in his plan of care however would be agreeable to SNF placement. Shortly after pt sister [name] arrived and pt greeted her. Sister stated she would like pt to go to [place] at discharge, pt agreed and expressed no concern. CSW contacted [place] who will confirm bed offer. Unit CSW to remain following for placement.  Theresia Bough, MSW, LCSW [phone]

## 2013-07-06 NOTE — Interval H&P Note (Signed)
History and Physical Interval Note:  07/06/2013 11:44 AM  Jannifer Franklin  has presented today for surgery, with the diagnosis of NSTEMI, ABNORMAL MYOVIEW STRESS TEST (would estimate at least Intermediate Risk Myoview). The various methods of treatment have been discussed with the patient and family. After consideration of risks, benefits and other options for treatment, the patient has consented to  Procedure(s): LEFT HEART CATHETERIZATION WITH CORONARY ANGIOGRAM (N/A) as a surgical intervention .  The patient's history has been reviewed, patient examined, no change in status, stable for surgery.  I have reviewed the patient's chart and labs.  Questions were answered to the patient's (and sister's) satisfaction.     Christean Silvestri W Cath Lab Visit (complete for each Cath Lab visit)  Clinical Evaluation Leading to the Procedure:   ACS: yes  Non-ACS:    Anginal Classification: CCS IV; Presented with CHF  Anti-ischemic medical therapy: Minimal Therapy (1 class of medications)  Non-Invasive Test Results: Intermediate-risk stress test findings: cardiac mortality 1-3%/year  Prior CABG: No previous CABG

## 2013-07-07 DIAGNOSIS — I2510 Atherosclerotic heart disease of native coronary artery without angina pectoris: Secondary | ICD-10-CM

## 2013-07-07 DIAGNOSIS — I82409 Acute embolism and thrombosis of unspecified deep veins of unspecified lower extremity: Secondary | ICD-10-CM

## 2013-07-07 LAB — BASIC METABOLIC PANEL
BUN: 17 mg/dL (ref 6–23)
CO2: 16 mEq/L — ABNORMAL LOW (ref 19–32)
Chloride: 109 mEq/L (ref 96–112)
GFR calc Af Amer: 65 mL/min — ABNORMAL LOW (ref >90)
GFR calc non Af Amer: 56 mL/min — ABNORMAL LOW (ref >90)
Potassium: 2.6 mEq/L — CL (ref 3.5–5.1)
Sodium: 142 mEq/L (ref 135–145)

## 2013-07-07 LAB — CBC
HCT: 34.5 % — ABNORMAL LOW (ref 39.0–52.0)
MCH: 29.1 pg (ref 26.0–34.0)
MCHC: 34.8 g/dL (ref 30.0–36.0)
RDW: 13.8 % (ref 11.5–15.5)
WBC: 5.5 10*3/uL (ref 4.0–10.5)

## 2013-07-07 LAB — HEPARIN LEVEL (UNFRACTIONATED): Heparin Unfractionated: <0.1 IU/mL — ABNORMAL LOW (ref 0.30–0.70)

## 2013-07-07 MED ORDER — POTASSIUM CHLORIDE CRYS ER 20 MEQ PO TBCR
40.0000 meq | EXTENDED_RELEASE_TABLET | Freq: Once | ORAL | Status: AC
  Administered 2013-07-07: 40 meq via ORAL
  Filled 2013-07-07: qty 2

## 2013-07-07 MED ORDER — POTASSIUM CHLORIDE CRYS ER 20 MEQ PO TBCR
40.0000 meq | EXTENDED_RELEASE_TABLET | Freq: Three times a day (TID) | ORAL | Status: DC
  Administered 2013-07-07 – 2013-07-09 (×6): 40 meq via ORAL
  Filled 2013-07-07 (×9): qty 2

## 2013-07-07 MED ORDER — AMOXICILLIN-POT CLAVULANATE 500-125 MG PO TABS
1.0000 | ORAL_TABLET | Freq: Three times a day (TID) | ORAL | Status: DC
  Administered 2013-07-07 – 2013-07-10 (×10): 500 mg via ORAL
  Filled 2013-07-07 (×12): qty 1

## 2013-07-07 MED ORDER — CLOPIDOGREL BISULFATE 75 MG PO TABS
75.0000 mg | ORAL_TABLET | Freq: Every day | ORAL | Status: DC
  Administered 2013-07-07 – 2013-07-09 (×3): 75 mg via ORAL
  Filled 2013-07-07 (×3): qty 1

## 2013-07-07 MED ORDER — ATORVASTATIN CALCIUM 20 MG PO TABS
20.0000 mg | ORAL_TABLET | Freq: Every day | ORAL | Status: DC
  Administered 2013-07-07: 20 mg via ORAL
  Filled 2013-07-07 (×2): qty 1

## 2013-07-07 MED ORDER — RIVAROXABAN 15 MG PO TABS
15.0000 mg | ORAL_TABLET | Freq: Two times a day (BID) | ORAL | Status: DC
  Administered 2013-07-07 – 2013-07-10 (×7): 15 mg via ORAL
  Filled 2013-07-07 (×9): qty 1

## 2013-07-07 MED ORDER — METOPROLOL TARTRATE 12.5 MG HALF TABLET
12.5000 mg | ORAL_TABLET | Freq: Two times a day (BID) | ORAL | Status: DC
  Administered 2013-07-07 – 2013-07-08 (×3): 12.5 mg via ORAL
  Filled 2013-07-07 (×4): qty 1

## 2013-07-07 MED ORDER — RIVAROXABAN 20 MG PO TABS: 20.0000 mg | ORAL_TABLET | Freq: Every day | ORAL | Status: DC

## 2013-07-07 NOTE — Progress Notes (Signed)
[facility] Progress Note [place] TEAM 1 - Stepdown/ICU TEAM   DEFOREST MAIDEN ZOX:[id number] DOB: [birth year] DOA: 07/02/2013 PCP: No primary provider on file.  Admit HPI / Brief Narrative: 67 y/o male with dementia and HOCM presented to [facility] 11/2 with several days of dyspnea. Noted to have NSTEMI, severe sepsis, pneumonia vs acute CHF exacerbation and ?acute abdomen. Stated that he had noted several days of dyspnea and cough. Family noted this had been going on ~1 week but pt had refused hospitalization. He is apparently stoic at baseline and had not noticed abdominal pain despite increasing distension. Also denied chest pain.  SIGNIFICANT EVENTS / STUDIES:  11/2: NSTEMI, likely demand from sepsis  11/2: CT abdomen>pneumonia v atelectasis, dilated bowel loops. Started BiPAP  11/3: To O2 by [facility] 11/3 LE duplex > Rt peroneal DVT 11/5 - [facility] assumes care   Assessment/Plan:  Acute hypoxemic resp failure due pulmonary edema Respiratory status has rapidly improved suggesting pulmonary edema as probable etiology  Severe sepsis > ?severe CAP  Augmentin to complete therapy x 7ds as ordered by PCCM  NSTEMI likely from demand from sepsis lexiscan completed- reveals reversibility in the anteroseptal wall and a prior fixed defect- Cath performed- medical therapy recommended - pt on ASA and Plavix along with statin  Severe Hypokalemia Replacing aggressively - follow closely  - also receiving Lasix - Mg normal yesterday  Ileus Resolved  Rt peroneal DVT recommend of anticoagulation therapyfor DVT -  - start Xarelto for 3 months - follow closely for bleeding on 3 blood thinners!!  HTN Well-controlled at present  Obstructive Cardiomyopathy  Cardiology is following  Baseline dementia - severe  Baseline bipolar disorder  Code Status: Limited code, no CPR, no mechanical ventilation, no shocks. Pressors and lines OK  Family Communication: discussed with sister  today Disposition Plan: transfer to tele- PT eval- SNF placement  Consultants: Cardiology   Procedures: Lexiscan - 11/5 - pending   Antibiotics: 11/2 zosyn >>  11/2 azithromycin >> 11/3   DVT prophylaxis: IV heparin   HPI/Subjective: The patient is sitting up in a chair comfortably- has no complaints- remains very confused.   Objective: Blood pressure 118/67, pulse 68, temperature 98.1 F (36.7 C), temperature source Oral, resp. rate 22, height 5\' 5"  (1.651 m), weight 64 kg (141 lb 1.5 oz), SpO2 100.00%.  Intake/Output Summary (Last 24 hours) at 07/07/13 1326 Last data filed at 07/07/13 1300  Gross per 24 hour  Intake  644.8 ml  Output    400 ml  Net  244.8 ml   Exam: General: No acute respiratory distress at rest Lungs: Mild basilar crackles with no wheeze with good air movement throughout other fields Cardiovascular: Regular rate and rhythm without murmur gallop or rub normal S1 and S2 Abdomen: Nontender, nondistended, soft, bowel sounds positive, no rebound, no ascites, no appreciable mass Extremities: No significant cyanosis, clubbing, or edema bilateral lower extremities  Data Reviewed: Basic Metabolic Panel:  Recent Labs Lab 07/02/13 1226  07/05/13 0407 07/05/13 1333 07/06/13 0430 07/06/13 1145 07/07/13 0905  NA  --   < > 143 143 146* 143 142  K  --   < > 2.1* 2.4* 2.8* 3.3* 2.6*  CL  --   < > 112 110 115* 113* 109  CO2  --   < > 20 21 18* 20 16*  GLUCOSE  --   < > 103* 141* 82 91 147*  BUN  --   < > 23 19 18  17  17  CREATININE  --   < > 1.44* 1.35 1.22 1.34 1.28  CALCIUM  --   < > 7.7* 8.2* 8.0* 8.3* 8.4  MG 2.2  --   --  2.1 2.0  --   --   < > = values in this interval not displayed. Liver Function Tests:  Recent Labs Lab 07/06/13 0430  AST 40*  ALT 32  ALKPHOS 25*  BILITOT 0.4  PROT 6.0  ALBUMIN 2.6*   No results found for this basename: LIPASE, AMYLASE,  in the last 168 hours No results found for this basename: AMMONIA,  in the last 168  hours CBC:  Recent Labs Lab 07/02/13 1124 07/04/13 0400 07/05/13 0407 07/06/13 0430 07/06/13 1053 07/07/13 0441  WBC 11.0* 6.6 6.7 6.4 5.3 5.5  NEUTROABS 9.3*  --   --   --   --   --   HGB 13.4 12.0* 11.9* 11.7* 12.2* 12.0*  HCT 36.8* 34.8* 33.8* 33.1* 35.1* 34.5*  MCV 83.4 83.9 83.7 83.6 84.6 83.5  PLT 185 217 237 246 254 256   Cardiac Enzymes:  Recent Labs Lab 07/02/13 1125 07/03/13 0859  TROPONINI 6.06* 4.53*   BNP (last 3 results)  Recent Labs  07/02/13 1125  PROBNP 18591.0*   CBG:  Recent Labs Lab 07/02/13 1551  GLUCAP 131*    Recent Results (from the past 240 hour(s))  MRSA PCR SCREENING     Status: None   Collection Time    07/02/13  3:47 PM      Result Value Range Status   MRSA by PCR NEGATIVE  NEGATIVE Final   Comment:            The GeneXpert MRSA Assay (FDA     approved for NASAL specimens     only), is one component of a     comprehensive MRSA colonization     surveillance program. It is not     intended to diagnose MRSA     infection nor to guide or     monitor treatment for     MRSA infections.  URINE CULTURE     Status: None   Collection Time    07/02/13  3:48 PM      Result Value Range Status   Specimen Description URINE, CATHETERIZED   Final   Special Requests Normal   Final   Culture  Setup Time     Final   Value: 07/02/2013 21:49     Performed at [facility] Count     Final   Value: NO GROWTH     Performed at [facility]   Culture     Final   Value: NO GROWTH     Performed at [facility]   Report Status 07/03/2013 FINAL   Final  CULTURE, BLOOD (ROUTINE X 2)     Status: None   Collection Time    07/02/13  4:33 PM      Result Value Range Status   Specimen Description BLOOD LEFT ARM   Final   Special Requests BOTTLES DRAWN AEROBIC ONLY 3CC   Final   Culture  Setup Time     Final   Value: 07/02/2013 21:48     Performed at [facility]   Culture     Final   Value:        BLOOD  CULTURE RECEIVED NO GROWTH TO DATE CULTURE WILL BE HELD FOR 5 DAYS BEFORE ISSUING  A FINAL NEGATIVE REPORT     Performed at [facility]   Report Status PENDING   Incomplete  CULTURE, BLOOD (ROUTINE X 2)     Status: None   Collection Time    07/02/13  4:38 PM      Result Value Range Status   Specimen Description BLOOD RIGHT ANTECUBITAL   Final   Special Requests BOTTLES DRAWN AEROBIC AND ANAEROBIC 10CC   Final   Culture  Setup Time     Final   Value: 07/02/2013 21:48     Performed at [facility]   Culture     Final   Value:        BLOOD CULTURE RECEIVED NO GROWTH TO DATE CULTURE WILL BE HELD FOR 5 DAYS BEFORE ISSUING A FINAL NEGATIVE REPORT     Performed at [facility]   Report Status PENDING   Incomplete  CLOSTRIDIUM DIFFICILE BY PCR     Status: None   Collection Time    07/03/13  2:01 PM      Result Value Range Status   C difficile by pcr NEGATIVE  NEGATIVE Final     Studies:  Recent x-ray studies have been reviewed in detail by the Attending Physician  Scheduled Meds:  Scheduled Meds: . amoxicillin-clavulanate  1 tablet Oral Q8H  . antiseptic oral rinse  15 mL Mouth Rinse BID  . aspirin  81 mg Oral Daily  . atorvastatin  20 mg Oral q1800  . clopidogrel  75 mg Oral Q breakfast  . famotidine  20 mg Oral BID  . furosemide  40 mg Oral Daily  . metoprolol tartrate  12.5 mg Oral BID  . potassium chloride  40 mEq Oral TID  . Rivaroxaban  15 mg Oral BID WC  . [START ON 07/28/2013] rivaroxaban  20 mg Oral Q supper  . sodium chloride  3 mL Intravenous Q12H    Time spent on care of this patient: 35 mins   Calvert Cantor, MD  [facility] Office  [phone] Pager - Text Page per Loretha Stapler as per below:  On-Call/Text Page:      [email or web address]      password [email or web address]  If 7PM-7AM, please contact night-coverage [email or web address] Password [email or web address] 07/07/2013, 1:26 PM   LOS: 5 days

## 2013-07-07 NOTE — Progress Notes (Signed)
Covering Clinical Child psychotherapist (CSW) received a call from [place] with [facility] confirming a bed offer has been made for pt. Pt sister [name] aware and agreeable. CSW to remain following for placement.  Theresia Bough, MSW, LCSW [phone]

## 2013-07-07 NOTE — Progress Notes (Signed)
Subjective: No complaints. Denies chest pain. No SOB.   Objective: Vital signs in last 24 hours: Temp:  [97.9 F (36.6 C)-99.4 F (37.4 C)] 99.3 F (37.4 C) (11/07 0402) Pulse Rate:  [67-88] 69 (11/07 0300) Resp:  [13-21] 16 (11/07 0300) BP: (98-121)/(52-92) 108/60 mmHg (11/07 0300) SpO2:  [99 %-100 %] 100 % (11/07 0300) Weight:  [141 lb 1.5 oz (64 kg)] 141 lb 1.5 oz (64 kg) (11/06 1019) Last BM Date: 07/06/13  Intake/Output from previous day: 11/06 0701 - 11/07 0700 In: 477.5 [P.O.:240; I.V.:125; IV Piggyback:112.5] Out: 900 [Urine:900] Intake/Output this shift:    Medications Current Facility-Administered Medications  Medication Dose Route Frequency Provider Last Rate Last Dose  . 0.9 %  sodium chloride infusion  250 mL Intravenous PRN Chrystie Nose, MD      . 0.9 %  sodium chloride infusion  1 mL/kg/hr Intravenous Continuous Chrystie Nose, MD 63.9 mL/hr at 07/07/13 0324 1 mL/kg/hr at 07/07/13 0324  . albuterol (PROVENTIL) (5 MG/ML) 0.5% nebulizer solution 2.5 mg  2.5 mg Nebulization Q3H PRN Lupita Leash, MD      . antiseptic oral rinse (BIOTENE) solution 15 mL  15 mL Mouth Rinse BID Lupita Leash, MD   15 mL at 07/06/13 2000  . aspirin chewable tablet 81 mg  81 mg Oral Daily Lupita Leash, MD   81 mg at 07/06/13 1000  . famotidine (PEPCID) tablet 20 mg  20 mg Oral BID Leona Singleton, MD   20 mg at 07/06/13 2158  . furosemide (LASIX) tablet 40 mg  40 mg Oral Daily Brittainy Simmons, PA-C   40 mg at 07/06/13 0953  . heparin ADULT infusion 100 units/mL (25000 units/250 mL)  1,250 Units/hr Intravenous Continuous Hilario Quarry Amend, RPH 12.5 mL/hr at 07/06/13 0224 1,250 Units/hr at 07/06/13 0224  . piperacillin-tazobactam (ZOSYN) IVPB 3.375 g  3.375 g Intravenous Q8H Gala Lewandowsky Phoenix, RPH   3.375 g at 07/07/13 0506  . sodium chloride 0.9 % injection 3 mL  3 mL Intravenous Q12H Chrystie Nose, MD   3 mL at 07/06/13 2158  . sodium chloride 0.9 %  injection 3 mL  3 mL Intravenous PRN Chrystie Nose, MD        PE: General appearance: alert, cooperative and no distress Lungs: clear to auscultation bilaterally Heart: regular rate and rhythm, 3/6 murmur best heard at LUSB Extremities: no LEE, right wrist: stable, 2+ radial pulse Pulses: 2+ and symmetric Skin: warm and dry Neurologic: Grossly normal  Lab Results:   Recent Labs  07/06/13 0430 07/06/13 1053 07/07/13 0441  WBC 6.4 5.3 5.5  HGB 11.7* 12.2* 12.0*  HCT 33.1* 35.1* 34.5*  PLT 246 254 256   BMET  Recent Labs  07/05/13 1333 07/06/13 0430 07/06/13 1145  NA 143 146* 143  K 2.4* 2.8* 3.3*  CL 110 115* 113*  CO2 21 18* 20  GLUCOSE 141* 82 91  BUN 19 18 17   CREATININE 1.35 1.22 1.34  CALCIUM 8.2* 8.0* 8.3*   PT/INR  Recent Labs  07/06/13 1053  LABPROT 14.2  INR 1.12   Lipid Panel     Component Value Date/Time   CHOL  Value: 163        ATP III CLASSIFICATION:  <200     mg/dL   Desirable  161-096  mg/dL   Borderline High  >=045    mg/dL   High 40/05/8118 1478   TRIG 122 08/04/2008 0500  HDL 27* 08/04/2008 0500   CHOLHDL 6.0 08/04/2008 0500   VLDL 24 08/04/2008 0500   LDLCALC  Value: 112        Total Cholesterol/HDL:CHD Risk Coronary Heart Disease Risk Table                     Men   Women  1/2 Average Risk   3.4   3.3* 08/04/2008 0500   Cardiac Panel (last 3 results) No results found for this basename: CKTOTAL, CKMB, TROPONINI, RELINDX,  in the last 72 hours   Studies/Results:  Diagnostic LHC 07-10-13 POST-OPERATIVE DIAGNOSIS:  Moderate LAD disease with moderate to severe ostial first diagonal disease that is likely the culprit lesion. Based on the ostial nature of this stenosis that is correlating with the LAD lesion, this is not a great stent-type PCI option. Scoring are Cutting Balloon angioplasty would be the only option for this vessel, which would not guarantee an adequate result, and could potentially lead jeopardizing the LAD and lead to a  stent being placed in the LAD that is not required.  Well-preserved LV EF with normal EDP. 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 Medical therapy  Plan: s/p diagnostic LHC in the setting of NSTEMI. He was found to have moderate LAD disease with moderate to severe ostial first diagonal disease that is likely the culprit lesion. Based on the ostial nature of this stenosis that is correlating with the LAD lesion, this is not a great stent-type PCI option. Scoring and Cutting Balloon angioplasty would be the only option for this vessel, which would not guarantee an adequate result, and could potentially lead jeopardizing the LAD and lead to a stent being placed in the LAD that is not required. Thus medical therapy has been elected. He has well-preserved LV EF with normal EDP. EF of 55%. He continues to deny any chest pain. No SOB. We will continue on ASA. ? Adding Plavix. Will need to add a BB. SBP 114. HR in the 90s. Can add low dose Lopressor. LDL is 112. Goal is 70-100. He will need a statin for secondary prevention. However, will wait until liver function improves. Can add as an OP. His right radial access site is stable. I have ordered BMP to assess electrolytes and renal function. Replete K if hypokalemic. Can likely transfer to telemetry today. ? Nursing home care. Will defer decision to primary treatment team. MD to follow.    LOS: 5 days     Brittainy M. Delmer Islam 07/07/2013 8:33 AM  I have seen and examined the patient along with Brittainy M. Sharol Harness, PA-C.  I have reviewed the chart, notes and new data.  I agree with PA's note.  PLAN: Medical Rx for moderate LAD-Diagonal branch disease. May need PCI if he develops symptoms (angina, CHF, etc). ASA, Plavix, statin target LDL<70 (LFTs are essentially normal).  Thurmon Fair, MD, [facility] [phone] 07/07/2013, 9:15 AM

## 2013-07-07 NOTE — Progress Notes (Signed)
ANTICOAGULATION CONSULT NOTE - Follow Up Consult  Pharmacy Consult for Xarelto Indication: DVT  No Known Allergies  Patient Measurements: Height: 5\' 5"  (165.1 cm) Weight: 141 lb 1.5 oz (64 kg) IBW/kg (Calculated) : 61.5  Vital Signs: Temp: 98.7 F (37.1 C) (11/07 0800) Temp src: Oral (11/07 0800) BP: 114/59 mmHg (11/07 0800) Pulse Rate: 81 (11/07 0800)  Labs:  Recent Labs  07/05/13 0407 07/05/13 1333 07/06/13 0430 07/06/13 1053 07/06/13 1145 07/07/13 0441  HGB 11.9*  --  11.7* 12.2*  --  12.0*  HCT 33.8*  --  33.1* 35.1*  --  34.5*  PLT 237  --  246 254  --  256  LABPROT  --   --   --  14.2  --   --   INR  --   --   --  1.12  --   --   HEPARINUNFRC 0.33  --  0.67  --   --  <0.10*  CREATININE 1.44* 1.35 1.22  --  1.34  --     Estimated Creatinine Clearance: 45.9 ml/min (by C-G formula based on Cr of 1.34).  Assessment: 67yom admitted 11/2 with NSTEMI, also found to have a DVT in his right peritoneal vein 11/3. He was started on IV heparin. Underwent cardiac cath yesterday and was found to have moderate LAD disease. Due to the high risk of PTCA, plan is for medical management. IV heparin was discontinued after cath. He will now begin xarelto for treatment of his DVT. He does have some renal insufficiency with sCr 1.34 and CrCl 33ml/min. CBC is stable.  Goal of Therapy:  Monitor platelets by anticoagulation protocol: Yes   Plan:  1) Xarelto 15mg  po bid x 21 days then 20mg  po qsupper starting 11/28 2) Follow renal function and CBC  Fredrik Rigger 07/07/2013,10:42 AM

## 2013-07-07 NOTE — Progress Notes (Signed)
CRITICAL VALUE ALERT  Critical value received: Potassium   Date of notification:  07/07/2013  Time of notification:  1240  Critical value read back: Yes  Nurse who received alert:  [place] Mario Voong RN  MD notified (1st page):  Dr. Butler Denmark  Time of first page:  1245  Responding MD:  Dr. Butler Denmark  Time MD responded:  [phone]

## 2013-07-08 DIAGNOSIS — J189 Pneumonia, unspecified organism: Secondary | ICD-10-CM | POA: Diagnosis present

## 2013-07-08 LAB — BASIC METABOLIC PANEL
CO2: 22 mEq/L (ref 19–32)
Chloride: 110 mEq/L (ref 96–112)
Creatinine, Ser: 1.14 mg/dL (ref 0.50–1.35)
GFR calc Af Amer: 74 mL/min — ABNORMAL LOW (ref >90)
Glucose, Bld: 94 mg/dL (ref 70–99)
Potassium: 2.9 mEq/L — ABNORMAL LOW (ref 3.5–5.1)
Sodium: 141 mEq/L (ref 135–145)

## 2013-07-08 LAB — CULTURE, BLOOD (ROUTINE X 2): Culture: NO GROWTH

## 2013-07-08 LAB — POTASSIUM: Potassium: 3.3 mEq/L — ABNORMAL LOW (ref 3.5–5.1)

## 2013-07-08 LAB — MAGNESIUM: Magnesium: 2 mg/dL (ref 1.5–2.5)

## 2013-07-08 MED ORDER — POTASSIUM CHLORIDE 10 MEQ/100ML IV SOLN
10.0000 meq | INTRAVENOUS | Status: AC
  Administered 2013-07-08 (×3): 10 meq via INTRAVENOUS
  Filled 2013-07-08 (×3): qty 100

## 2013-07-08 MED ORDER — METOPROLOL TARTRATE 25 MG PO TABS
25.0000 mg | ORAL_TABLET | Freq: Two times a day (BID) | ORAL | Status: DC
  Administered 2013-07-08 – 2013-07-09 (×2): 25 mg via ORAL
  Filled 2013-07-08 (×3): qty 1

## 2013-07-08 MED ORDER — ATORVASTATIN CALCIUM 40 MG PO TABS
40.0000 mg | ORAL_TABLET | Freq: Every day | ORAL | Status: DC
  Administered 2013-07-08 – 2013-07-09 (×2): 40 mg via ORAL
  Filled 2013-07-08 (×3): qty 1

## 2013-07-08 NOTE — Progress Notes (Addendum)
Subjective: No chest pain no SOB  Objective: Vital signs in last 24 hours: Temp:  [98 F (36.7 C)-98.6 F (37 C)] 98.6 F (37 C) (11/08 0607) Pulse Rate:  [71-82] 71 (11/08 0607) Resp:  [15-18] 18 (11/08 0607) BP: (108-124)/(59-76) 117/59 mmHg (11/08 0607) SpO2:  [99 %-100 %] 99 % (11/08 0607) Weight change:  Last BM Date: 07/07/13 Intake/Output from previous day: +311 11/07 0701 - 11/08 0700 In: 547.3 [P.O.:100; I.V.:447.3] Out: 300 [Urine:300] Intake/Output this shift:    PE: General:Pleasant affect, NAD, eating lunch Skin:Warm and dry, brisk capillary refill HEENT:normocephalic, sclera clear, mucus membranes moist Heart:S1S2 RRR with 2/6 SE murmur, no gallup, rub or click Lungs:clear without rales, rhonchi, or wheezes ZOX:WRUE, non tender, + BS, do not palpate liver spleen or masses Ext:no lower ext edema Neuro:alert and oriented, MAE, follows commands, + facial symmetry  tele: SR  Lab Results:  Recent Labs  07/06/13 1053 07/07/13 0441  WBC 5.3 5.5  HGB 12.2* 12.0*  HCT 35.1* 34.5*  PLT 254 256   BMET  Recent Labs  07/07/13 0905 07/08/13 0420  NA 142 141  K 2.6* 2.9*  CL 109 110  CO2 16* 22  GLUCOSE 147* 94  BUN 17 16  CREATININE 1.28 1.14  CALCIUM 8.4 8.1*   No results found for this basename: TROPONINI, CK, MB,  in the last 72 hours  Lab Results  Component Value Date   CHOL  Value: 163        ATP III CLASSIFICATION:  <200     mg/dL   Desirable  454-098  mg/dL   Borderline High  >=119    mg/dL   High 14/02/8294   HDL 27* 08/04/2008   LDLCALC  Value: 112        Total Cholesterol/HDL:CHD Risk Coronary Heart Disease Risk Table                     Men   Women  1/2 Average Risk   3.4   3.3* 08/04/2008   TRIG 122 08/04/2008   CHOLHDL 6.0 08/04/2008   No results found for this basename: HGBA1C      Hepatic Function Panel  Recent Labs  07/06/13 0430  PROT 6.0  ALBUMIN 2.6*  AST 40*  ALT 32  ALKPHOS 25*  BILITOT 0.4      Studies/Results: No results found.  Medications: I have reviewed the patient's current medications. Scheduled Meds: . amoxicillin-clavulanate  1 tablet Oral Q8H  . antiseptic oral rinse  15 mL Mouth Rinse BID  . aspirin  81 mg Oral Daily  . atorvastatin  20 mg Oral q1800  . clopidogrel  75 mg Oral Q breakfast  . famotidine  20 mg Oral BID  . furosemide  40 mg Oral Daily  . metoprolol tartrate  12.5 mg Oral BID  . potassium chloride  10 mEq Intravenous Q1 Hr x 3  . potassium chloride  40 mEq Oral TID  . Rivaroxaban  15 mg Oral BID WC  . 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 Medical therapy   PLAN: NSTEMI treating medically, see cath report.  If breakthrough pain may be candidate cutting balloon of LAD.  EF 55%. On ASA, PLAVIX, and Xarelto (DVT). Currently stable without pain.    LOS: 6 days   Time spent with pt. :15 minutes. [facility] R  Nurse Practitioner Certified Pager [phone] 07/08/2013, 12:03 PM     Patient seen and examined. Agree with assessment and plan.  No chest pain. HR 78 at rest. Will further titrate lopressor to 25 mg bid; increase atorvastatin to 40 mg to hopefully induce plaque regression.   Lennette Bihari, MD, [facility] 07/08/2013 1:33 PM

## 2013-07-08 NOTE — Progress Notes (Signed)
Patient ID: Eric Zamora  male  ZOX:[id number]    DOB: 06/12/1946    DOA: 07/02/2013  PCP: No primary provider on file.  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 Medical therapy  Acute hypoxemic resp failure due pulmonary edema : Resolved - Respiratory status stable, O2 sats 99% on room air   Severe sepsis > ?severe CAP  Augmentin to complete therapy x 7days as ordered by PCCM   NSTEMI likely from demand from sepsis  - lexiscan completed- reveals reversibility in the anteroseptal wall and a prior fixed defect - cardiology following, patient underwent cardiac cath on 07/06/13, moderate LAD- diagonal branch disease, medical treatment recommended may need PCI if he develops symptoms (angina, CHF) - Cath performed- medical therapy recommended  - pt on ASA and Plavix, statin   Severe Hypokalemia  - Replacing aggressively, currently on oral potassium TID, add IV Kcl, recheck potassium at 1 PM, also check magnesium  - also receiving Lasix   Ileus  Resolved, patient tolerating oral diet without any difficulty   Rt peroneal DVT  - recommend 3months of anticoagulation therapy for DVT -  - start Xarelto for 3 months  - follow closely for bleeding on 3 blood thinners, aspirin, Plavix, xarelto  HTN  Well-controlled at present   Obstructive Cardiomyopathy  Cardiology is following   Baseline dementia  - severe  Baseline bipolar  disorder  DVT Prophylaxis:  Code Status:  Disposition: DC to skilled nursing facility on Monday   Subjective: Denies any specific complaints, tolerating oral diet, potassium again low at 2.9 today. On room air, denies any fevers, chills, cough, chest pain or shortness of breath  Objective: Weight change:   Intake/Output Summary (Last 24 hours) at 07/08/13 0929 Last data filed at 07/07/13 1400  Gross per 24 hour  Intake  319.5 ml  Output    300 ml  Net   19.5 ml   Blood pressure 117/59, pulse 71, temperature 98.6 F (37 C), temperature source Oral, resp. rate 18, height 5\' 5"  (1.651 m), weight 64 kg (141 lb 1.5 oz), SpO2 99.00%.  Physical Exam: General: Alert and awake, oriented x3, not in any acute distress. CVS: S1-S2 clear, 3/6 SEM at LUSB Chest: clear to auscultation bilaterally, no wheezing, rales or rhonchi Abdomen: soft nontender, nondistended, normal bowel sounds  Extremities: no cyanosis, clubbing or edema noted bilaterally Neuro: Cranial nerves II-XII intact, no focal neurological deficits  Lab Results: Basic Metabolic Panel:  Recent Labs Lab 07/06/13 0430  07/07/13 0905 07/08/13 0420  NA 146*  < > 142 141  K 2.8*  < > 2.6* 2.9*  CL 115*  < > 109 110  CO2 18*  < > 16* 22  GLUCOSE 82  < > 147* 94  BUN 18  < > 17 16  CREATININE 1.22  < > 1.28 1.14  CALCIUM 8.0*  < > 8.4 8.1*  MG 2.0  --   --   --   < > =  values in this interval not displayed. Liver Function Tests:  Recent Labs Lab 07/06/13 0430  AST 40*  ALT 32  ALKPHOS 25*  BILITOT 0.4  PROT 6.0  ALBUMIN 2.6*   No results found for this basename: LIPASE, AMYLASE,  in the last 168 hours No results found for this basename: AMMONIA,  in the last 168 hours CBC:  Recent Labs Lab 07/02/13 1124  07/06/13 1053 07/07/13 0441  WBC 11.0*  < > 5.3 5.5  NEUTROABS 9.3*  --   --   --   HGB 13.4  < > 12.2* 12.0*  HCT 36.8*  < > 35.1* 34.5*  MCV 83.4  < > 84.6 83.5  PLT 185  < > 254 256  < > =  values in this interval not displayed. Cardiac Enzymes:  Recent Labs Lab 07/02/13 1125 07/03/13 0859  TROPONINI 6.06* 4.53*   BNP: No components found with this basename: POCBNP,  CBG:  Recent Labs Lab 07/02/13 1551  GLUCAP 131*     Micro Results: Recent Results (from the past 240 hour(s))  MRSA PCR SCREENING     Status: None   Collection Time    07/02/13  3:47 PM      Result Value Range Status   MRSA by PCR NEGATIVE  NEGATIVE Final   Comment:            The GeneXpert MRSA Assay (FDA     approved for NASAL specimens     only), is one component of a     comprehensive MRSA colonization     surveillance program. It is not     intended to diagnose MRSA     infection nor to guide or     monitor treatment for     MRSA infections.  URINE CULTURE     Status: None   Collection Time    07/02/13  3:48 PM      Result Value Range Status   Specimen Description URINE, CATHETERIZED   Final   Special Requests Normal   Final   Culture  Setup Time     Final   Value: 07/02/2013 21:49     Performed at [facility] Count     Final   Value: NO GROWTH     Performed at [facility]   Culture     Final   Value: NO GROWTH     Performed at [facility]   Report Status 07/03/2013 FINAL   Final  CULTURE, BLOOD (ROUTINE X 2)     Status: None   Collection Time    07/02/13  4:33 PM      Result Value Range Status   Specimen Description BLOOD LEFT ARM   Final   Special Requests BOTTLES DRAWN AEROBIC ONLY 3CC   Final   Culture  Setup Time     Final   Value: 07/02/2013 21:48     Performed at [facility]   Culture     Final   Value: NO GROWTH 5 DAYS     Performed at [facility]   Report Status 07/08/2013 FINAL   Final  CULTURE, BLOOD (ROUTINE X 2)     Status: None   Collection Time    07/02/13  4:38 PM      Result Value Range Status   Specimen Description BLOOD RIGHT ANTECUBITAL   Final   Special Requests BOTTLES DRAWN AEROBIC AND  ANAEROBIC 10CC   Final  Culture  Setup Time     Final   Value: 07/02/2013 21:48     Performed at [facility]   Culture     Final   Value: NO GROWTH 5 DAYS     Performed at [facility]   Report Status 07/08/2013 FINAL   Final  CLOSTRIDIUM DIFFICILE BY PCR     Status: None   Collection Time    07/03/13  2:01 PM      Result Value Range Status   C difficile by pcr NEGATIVE  NEGATIVE Final    Studies/Results: Ct Abdomen Pelvis Wo Contrast  07/02/2013   CLINICAL DATA:  Acute abdominal pain. Sepsis.  Renal insufficiency.  EXAM: CT ABDOMEN AND PELVIS WITHOUT CONTRAST  TECHNIQUE: Multidetector CT imaging of the abdomen and pelvis was performed following the standard protocol without intravenous contrast.  COMPARISON:  None.  FINDINGS: Images through the lung bases show small bilateral pleural effusions and bibasilar atelectasis.  Diffuse gaseous distention of small bowel and colon seen, consistent with ileus. Noncontrast images of the liver, pancreas, spleen, adrenal glands, and kidneys are normal in appearance. No evidence of hydronephrosis.  No soft tissue masses or lymphadenopathy identified within the abdomen or pelvis. No evidence of inflammatory process or abnormal fluid collections.  IMPRESSION: Diffuse gaseous distention of small bowel and colon, consistent with adynamic ileus.  No evidence of intra-abdominal mass, inflammatory process, or abscess. .  Bilateral pleural effusions and bibasilar atelectasis.   Electronically Signed   By: Myles Rosenthal M.D.   On: 07/02/2013 16:48   Dg Chest 2 View  07/04/2013   CLINICAL DATA:  Increased heart rate with movement of any kind  EXAM: CHEST  2 VIEW  COMPARISON:  07/02/2013  FINDINGS: Heart size upper normal and stable. Vascular pattern normal. Mild medial right lung base infiltrate. Subtle diffuse hazy opacity over the left lung consistent with resolving infiltrate seen on prior study. Small bilateral effusions.  IMPRESSION: Improved  aeration on the left with residual but markedly improved diffuse infiltrate. Minimal infiltrate in the right base, which is increased from the prior study.   Electronically Signed   By: Esperanza Heir M.D.   On: 07/04/2013 10:06   Dg Abd 1 View  07/04/2013   CLINICAL DATA:  Bowel dilatation, bloody urine, diarrhea, abdominal distention, history adynamic ileus and sepsis  EXAM: ABDOMEN - 1 VIEW  COMPARISON:  CT abdomen and pelvis 07/02/2013  FINDINGS: Air-filled nonspecific bowel loops are seen in the mid abdomen common minimally distended.  These are relatively featureless.  No definite bowel wall thickening identified.  Bilateral pelvic phleboliths.  No acute osseous abnormalities or definite urinary tract obstruction.  IMPRESSION: Air-filled loops of bowel in the mid abdomen, nonspecific but favor ileus.   Electronically Signed   By: Ulyses Southward M.D.   On: 07/04/2013 10:24   Nm Myocar Multi W/spect W/wall Motion / Ef  07/05/2013   CLINICAL DATA:  67 year old with NSTEMI.  EXAM: MYOCARDIAL IMAGING WITH SPECT (REST AND PHARMACOLOGIC-STRESS)  GATED LEFT VENTRICULAR WALL MOTION STUDY  LEFT VENTRICULAR EJECTION FRACTION  TECHNIQUE: Standard myocardial SPECT imaging performed after resting intravenous injection of 10 mCi Tc-80m sestimibi. Subsequently, intravenous infusion of regadenoson performed under the supervision of the Cardiology staff. At peak effect of the drug, 30 mCi Tc-44m sestimibi injected intravenously and standard myocardial SPECT imaging performed. Quantitative gated imaging also performed to evaluate left ventricular wall motion and estimate left ventricular ejection fraction.  FINDINGS: MYOCARDIAL IMAGING WITH SPECT (  REST AND PHARMACOLOGIC-STRESS)  There is concern for reversibility along the anteroseptal wall at the apex and mid segment. There is a fixed defect along the inferior wall.  GATED LEFT VENTRICULAR WALL MOTION STUDY  Review of the gated images demonstrates hypokinesis in the septal  wall and some hypokinesis in the inferior wall.  LEFT VENTRICULAR EJECTION FRACTION  QGS ejection fraction measures 54% , with an end-diastolic volume of 118 ml and an end-systolic volume of 54 ml.  IMPRESSION: Concern for reversibility and pharmacological induced ischemia along the anteroseptal wall. In addition, there is hypokinesia in the septal wall.  Fixed defect in the inferior wall with hypokinesia. Findings could represent prior infarct and/or diaphragm attenuation.  These results will be called to the ordering clinician or representative by the Radiologist Assistant, and communication documented in the PACS Dashboard.   Electronically Signed   By: Richarda Overlie M.D.   On: 07/05/2013 14:50   Dg Chest Port 1 View  07/06/2013   CLINICAL DATA:  Evaluate possible pulmonary infiltrates  EXAM: PORTABLE CHEST - 1 VIEW  COMPARISON:  07/04/2013; 07/02/2013; 08/03/2008  FINDINGS: Grossly unchanged borderline enlarged cardiac silhouette and mediastinal contours with nodular prominence of the right pulmonary hilum. Interval resolution of left upper and mid lung heterogeneous airspace opacities. A granuloma overlies the left mid lung. No new focal airspace opacities. No pleural effusion or pneumothorax. No evidence of edema. Unchanged bones.  IMPRESSION: 1. Interval resolution of left upper and mid lung heterogeneous airspace opacities which given rapid resolution suggests results asymmetric pulmonary edema, less likely, infection or aspiration.  2. Nodular prominence of the right pulmonary hila, possibly prominence of the right pulmonary vasculature though hilar adenopathy may have a similar appearance. Further evaluation with chest CT may be performed as clinically indicated.  This was made a call report.   Electronically Signed   By: Simonne Come M.D.   On: 07/06/2013 08:06   Dg Chest Port 1 View  07/02/2013   CLINICAL DATA:  Shortness of breath. Cough, congestion.  EXAM: PORTABLE CHEST - 1 VIEW  COMPARISON:   08/03/2008  FINDINGS: Heart size is normal. There is significant left lung infiltrate, implying the upper and lower lobes. Minimal right lower lobe atelectasis identified. No pulmonary edema.  IMPRESSION: Left upper and lower lobe infiltrates.   Electronically Signed   By: Rosalie Gums M.D.   On: 07/02/2013 12:35    Medications: Scheduled Meds: . amoxicillin-clavulanate  1 tablet Oral Q8H  . antiseptic oral rinse  15 mL Mouth Rinse BID  . aspirin  81 mg Oral Daily  . atorvastatin  20 mg Oral q1800  . clopidogrel  75 mg Oral Q breakfast  . famotidine  20 mg Oral BID  . furosemide  40 mg Oral Daily  . metoprolol tartrate  12.5 mg Oral BID  . potassium chloride  10 mEq Intravenous Q1 Hr x 3  . potassium chloride  40 mEq Oral TID  . Rivaroxaban  15 mg Oral BID WC  . [START ON 07/28/2013] rivaroxaban  20 mg Oral Q supper      LOS: 6 days   Ervan Heber M.D. [facility] 07/08/2013, 9:29 AM Pager: [phone]  If 7PM-7AM, please contact night-coverage [email or web address] Password [email or web address]

## 2013-07-09 DIAGNOSIS — J189 Pneumonia, unspecified organism: Secondary | ICD-10-CM

## 2013-07-09 LAB — CBC
HCT: 34.8 % — ABNORMAL LOW (ref 39.0–52.0)
Hemoglobin: 12.4 g/dL — ABNORMAL LOW (ref 13.0–17.0)
MCHC: 35.6 g/dL (ref 30.0–36.0)
RBC: 4.16 MIL/uL — ABNORMAL LOW (ref 4.22–5.81)
WBC: 5.6 10*3/uL (ref 4.0–10.5)

## 2013-07-09 LAB — BASIC METABOLIC PANEL
CO2: 20 mEq/L (ref 19–32)
Calcium: 8.2 mg/dL — ABNORMAL LOW (ref 8.4–10.5)
Chloride: 109 mEq/L (ref 96–112)
Creatinine, Ser: 1.15 mg/dL (ref 0.50–1.35)
GFR calc Af Amer: 74 mL/min — ABNORMAL LOW (ref >90)
Glucose, Bld: 101 mg/dL — ABNORMAL HIGH (ref 70–99)
Potassium: 3.2 mEq/L — ABNORMAL LOW (ref 3.5–5.1)
Sodium: 138 mEq/L (ref 135–145)

## 2013-07-09 LAB — MAGNESIUM: Magnesium: 2 mg/dL (ref 1.5–2.5)

## 2013-07-09 MED ORDER — POTASSIUM CHLORIDE CRYS ER 20 MEQ PO TBCR
40.0000 meq | EXTENDED_RELEASE_TABLET | Freq: Three times a day (TID) | ORAL | Status: DC
  Administered 2013-07-09 – 2013-07-10 (×4): 40 meq via ORAL
  Filled 2013-07-09 (×4): qty 2

## 2013-07-09 MED ORDER — POTASSIUM CHLORIDE 10 MEQ/100ML IV SOLN
10.0000 meq | INTRAVENOUS | Status: AC
  Administered 2013-07-09 (×2): 10 meq via INTRAVENOUS
  Filled 2013-07-09 (×2): qty 100

## 2013-07-09 MED ORDER — METOPROLOL TARTRATE 25 MG PO TABS
37.5000 mg | ORAL_TABLET | Freq: Two times a day (BID) | ORAL | Status: DC
  Administered 2013-07-09: 23:00:00 37.5 mg via ORAL
  Filled 2013-07-09 (×3): qty 1

## 2013-07-09 MED ORDER — POTASSIUM CHLORIDE CRYS ER 20 MEQ PO TBCR: 40.0000 meq | EXTENDED_RELEASE_TABLET | Freq: Four times a day (QID) | ORAL | Status: DC

## 2013-07-09 NOTE — Progress Notes (Signed)
Patient ID: Eric Zamora  male  ZOX:[id number]    DOB: May 12, 1946    DOA: 07/02/2013  PCP: No primary provider on file.  Assessment/Plan:  Acute hypoxemic resp failure due pulmonary edema : Resolved - Respiratory status stable, on room air  Severe sepsis > ?severe CAP  Augmentin to complete therapy x 7days as ordered by PCCM   NSTEMI likely from demand from sepsis  - lexiscan completed- reveals reversibility in the anteroseptal wall and a prior fixed defect - patient underwent cardiac cath on 07/06/13, moderate LAD- diagonal branch disease, medical treatment recommended may need PCI if he develops symptoms (angina, CHF) - Cath performed- medical therapy recommended  - pt on ASA and Plavix, statin, cardiology following, Lopressor titrated up  Severe Hypokalemia  - Replacing aggressively, currently on oral potassium TID, still continues to be low,add IV potassium - also receiving Lasix   Ileus  Resolved, patient tolerating oral diet without any difficulty   Rt peroneal DVT  - recommend 3months of anticoagulation therapy for DVT, on xarelto  - follow closely for bleeding on 3 blood thinners, aspirin, Plavix, xarelto  HTN  Well-controlled at present   Obstructive Cardiomyopathy  Cardiology is following   Baseline dementia  - severe  Baseline bipolar disorder  DVT Prophylaxis:  Code Status:  Disposition: DC to skilled nursing facility on Monday Family communication: discussed in detail with patient's sister and brother-in-law at the bedside    Subjective: Denies any specific complaints, tolerating oral diet, family at the bedside   Objective: Weight change:   Intake/Output Summary (Last 24 hours) at 07/09/13 1030 Last data filed at 07/09/13 0832  Gross per 24 hour  Intake   1260 ml  Output   1900 ml  Net   -640 ml   Blood pressure 112/64, pulse 79, temperature 98.8 F (37.1 C), temperature source Oral, resp. rate 16, height 5\' 5"  (1.651 m), weight 64 kg (141 lb  1.5 oz), SpO2 100.00%.  Physical Exam: General: Alert and awake, oriented x3, NAD. CVS: S1-S2 clear, 3/6 SEM at LUSB Chest: CTAB Abdomen: soft NT, ND, NBS Extremities: no c/c/e bilaterally   Lab Results: Basic Metabolic Panel:  Recent Labs Lab 07/08/13 0420 07/08/13 1240 07/09/13 0527  NA 141  --  138  K 2.9* 3.3* 3.2*  CL 110  --  109  CO2 22  --  20  GLUCOSE 94  --  101*  BUN 16  --  16  CREATININE 1.14  --  1.15  CALCIUM 8.1*  --  8.2*  MG 2.0  --  2.0   Liver Function Tests:  Recent Labs Lab 07/06/13 0430  AST 40*  ALT 32  ALKPHOS 25*  BILITOT 0.4  PROT 6.0  ALBUMIN 2.6*   No results found for this basename: LIPASE, AMYLASE,  in the last 168 hours No results found for this basename: AMMONIA,  in the last 168 hours CBC:  Recent Labs Lab 07/02/13 1124  07/07/13 0441 07/09/13 0527  WBC 11.0*  < > 5.5 5.6  NEUTROABS 9.3*  --   --   --   HGB 13.4  < > 12.0* 12.4*  HCT 36.8*  < > 34.5* 34.8*  MCV 83.4  < > 83.5 83.7  PLT 185  < > 256 294  < > = values in this interval not displayed. Cardiac Enzymes:  Recent Labs Lab 07/02/13 1125 07/03/13 0859  TROPONINI 6.06* 4.53*   BNP: No components found with this basename: POCBNP,  CBG:  Recent Labs Lab 07/02/13 1551  GLUCAP 131*     Micro Results: Recent Results (from the past 240 hour(s))  MRSA PCR SCREENING     Status: None   Collection Time    07/02/13  3:47 PM      Result Value Range Status   MRSA by PCR NEGATIVE  NEGATIVE Final   Comment:            The GeneXpert MRSA Assay (FDA     approved for NASAL specimens     only), is one component of a     comprehensive MRSA colonization     surveillance program. It is not     intended to diagnose MRSA     infection nor to guide or     monitor treatment for     MRSA infections.  URINE CULTURE     Status: None   Collection Time    07/02/13  3:48 PM      Result Value Range Status   Specimen Description URINE, CATHETERIZED   Final    Special Requests Normal   Final   Culture  Setup Time     Final   Value: 07/02/2013 21:49     Performed at [facility] Count     Final   Value: NO GROWTH     Performed at [facility]   Culture     Final   Value: NO GROWTH     Performed at [facility]   Report Status 07/03/2013 FINAL   Final  CULTURE, BLOOD (ROUTINE X 2)     Status: None   Collection Time    07/02/13  4:33 PM      Result Value Range Status   Specimen Description BLOOD LEFT ARM   Final   Special Requests BOTTLES DRAWN AEROBIC ONLY 3CC   Final   Culture  Setup Time     Final   Value: 07/02/2013 21:48     Performed at [facility]   Culture     Final   Value: NO GROWTH 5 DAYS     Performed at [facility]   Report Status 07/08/2013 FINAL   Final  CULTURE, BLOOD (ROUTINE X 2)     Status: None   Collection Time    07/02/13  4:38 PM      Result Value Range Status   Specimen Description BLOOD RIGHT ANTECUBITAL   Final   Special Requests BOTTLES DRAWN AEROBIC AND ANAEROBIC 10CC   Final   Culture  Setup Time     Final   Value: 07/02/2013 21:48     Performed at [facility]   Culture     Final   Value: NO GROWTH 5 DAYS     Performed at [facility]   Report Status 07/08/2013 FINAL   Final  CLOSTRIDIUM DIFFICILE BY PCR     Status: None   Collection Time    07/03/13  2:01 PM      Result Value Range Status   C difficile by pcr NEGATIVE  NEGATIVE Final    Studies/Results: Ct Abdomen Pelvis Wo Contrast  07/02/2013   CLINICAL DATA:  Acute abdominal pain. Sepsis.  Renal insufficiency.  EXAM: CT ABDOMEN AND PELVIS WITHOUT CONTRAST  TECHNIQUE: Multidetector CT imaging of the abdomen and pelvis was performed following the standard protocol without intravenous contrast.  COMPARISON:  None.  FINDINGS: Images through the lung bases show small  bilateral pleural effusions and bibasilar atelectasis.  Diffuse gaseous distention of small bowel and colon seen,  consistent with ileus. Noncontrast images of the liver, pancreas, spleen, adrenal glands, and kidneys are normal in appearance. No evidence of hydronephrosis.  No soft tissue masses or lymphadenopathy identified within the abdomen or pelvis. No evidence of inflammatory process or abnormal fluid collections.  IMPRESSION: Diffuse gaseous distention of small bowel and colon, consistent with adynamic ileus.  No evidence of intra-abdominal mass, inflammatory process, or abscess. .  Bilateral pleural effusions and bibasilar atelectasis.   Electronically Signed   By: Myles Rosenthal M.D.   On: 07/02/2013 16:48   Dg Chest 2 View  07/04/2013   CLINICAL DATA:  Increased heart rate with movement of any kind  EXAM: CHEST  2 VIEW  COMPARISON:  07/02/2013  FINDINGS: Heart size upper normal and stable. Vascular pattern normal. Mild medial right lung base infiltrate. Subtle diffuse hazy opacity over the left lung consistent with resolving infiltrate seen on prior study. Small bilateral effusions.  IMPRESSION: Improved aeration on the left with residual but markedly improved diffuse infiltrate. Minimal infiltrate in the right base, which is increased from the prior study.   Electronically Signed   By: Esperanza Heir M.D.   On: 07/04/2013 10:06   Dg Abd 1 View  07/04/2013   CLINICAL DATA:  Bowel dilatation, bloody urine, diarrhea, abdominal distention, history adynamic ileus and sepsis  EXAM: ABDOMEN - 1 VIEW  COMPARISON:  CT abdomen and pelvis 07/02/2013  FINDINGS: Air-filled nonspecific bowel loops are seen in the mid abdomen common minimally distended.  These are relatively featureless.  No definite bowel wall thickening identified.  Bilateral pelvic phleboliths.  No acute osseous abnormalities or definite urinary tract obstruction.  IMPRESSION: Air-filled loops of bowel in the mid abdomen, nonspecific but favor ileus.   Electronically Signed   By: Ulyses Southward M.D.   On: 07/04/2013 10:24   Nm Myocar Multi W/spect W/wall  Motion / Ef  07/05/2013   CLINICAL DATA:  67 year old with NSTEMI.  EXAM: MYOCARDIAL IMAGING WITH SPECT (REST AND PHARMACOLOGIC-STRESS)  GATED LEFT VENTRICULAR WALL MOTION STUDY  LEFT VENTRICULAR EJECTION FRACTION  TECHNIQUE: Standard myocardial SPECT imaging performed after resting intravenous injection of 10 mCi Tc-69m sestimibi. Subsequently, intravenous infusion of regadenoson performed under the supervision of the Cardiology staff. At peak effect of the drug, 30 mCi Tc-35m sestimibi injected intravenously and standard myocardial SPECT imaging performed. Quantitative gated imaging also performed to evaluate left ventricular wall motion and estimate left ventricular ejection fraction.  FINDINGS: MYOCARDIAL IMAGING WITH SPECT (REST AND PHARMACOLOGIC-STRESS)  There is concern for reversibility along the anteroseptal wall at the apex and mid segment. There is a fixed defect along the inferior wall.  GATED LEFT VENTRICULAR WALL MOTION STUDY  Review of the gated images demonstrates hypokinesis in the septal wall and some hypokinesis in the inferior wall.  LEFT VENTRICULAR EJECTION FRACTION  QGS ejection fraction measures 54% , with an end-diastolic volume of 118 ml and an end-systolic volume of 54 ml.  IMPRESSION: Concern for reversibility and pharmacological induced ischemia along the anteroseptal wall. In addition, there is hypokinesia in the septal wall.  Fixed defect in the inferior wall with hypokinesia. Findings could represent prior infarct and/or diaphragm attenuation.  These results will be called to the ordering clinician or representative by the Radiologist Assistant, and communication documented in the PACS Dashboard.   Electronically Signed   By: Richarda Overlie M.D.   On: 07/05/2013 14:50  Dg Chest Port 1 View  07/06/2013   CLINICAL DATA:  Evaluate possible pulmonary infiltrates  EXAM: PORTABLE CHEST - 1 VIEW  COMPARISON:  07/04/2013; 07/02/2013; 08/03/2008  FINDINGS: Grossly unchanged borderline  enlarged cardiac silhouette and mediastinal contours with nodular prominence of the right pulmonary hilum. Interval resolution of left upper and mid lung heterogeneous airspace opacities. A granuloma overlies the left mid lung. No new focal airspace opacities. No pleural effusion or pneumothorax. No evidence of edema. Unchanged bones.  IMPRESSION: 1. Interval resolution of left upper and mid lung heterogeneous airspace opacities which given rapid resolution suggests results asymmetric pulmonary edema, less likely, infection or aspiration.  2. Nodular prominence of the right pulmonary hila, possibly prominence of the right pulmonary vasculature though hilar adenopathy may have a similar appearance. Further evaluation with chest CT may be performed as clinically indicated.  This was made a call report.   Electronically Signed   By: Simonne Come M.D.   On: 07/06/2013 08:06   Dg Chest Port 1 View  07/02/2013   CLINICAL DATA:  Shortness of breath. Cough, congestion.  EXAM: PORTABLE CHEST - 1 VIEW  COMPARISON:  08/03/2008  FINDINGS: Heart size is normal. There is significant left lung infiltrate, implying the upper and lower lobes. Minimal right lower lobe atelectasis identified. No pulmonary edema.  IMPRESSION: Left upper and lower lobe infiltrates.   Electronically Signed   By: Rosalie Gums M.D.   On: 07/02/2013 12:35    Medications: Scheduled Meds: . amoxicillin-clavulanate  1 tablet Oral Q8H  . antiseptic oral rinse  15 mL Mouth Rinse BID  . aspirin  81 mg Oral Daily  . atorvastatin  40 mg Oral q1800  . clopidogrel  75 mg Oral Q breakfast  . famotidine  20 mg Oral BID  . furosemide  40 mg Oral Daily  . metoprolol tartrate  25 mg Oral BID  . potassium chloride  40 mEq Oral QID  . Rivaroxaban  15 mg Oral BID WC  . [START ON 07/28/2013] rivaroxaban  20 mg Oral Q supper      LOS: 7 days   RAI,RIPUDEEP M.D. [facility] 07/09/2013, 10:30 AM Pager: [phone]  If 7PM-7AM, please contact  night-coverage [email or web address] Password [email or web address]

## 2013-07-09 NOTE — Progress Notes (Signed)
Subjective: no complaints  Objective: Vital signs in last 24 hours: Temp:  [97 F (36.1 C)-99.4 F (37.4 C)] 98.8 F (37.1 C) (11/09 0500) Pulse Rate:  [67-80] 79 (11/09 0953) Resp:  [16] 16 (11/09 0500) BP: (92-122)/(47-68) 112/64 mmHg (11/09 0953) SpO2:  [100 %] 100 % (11/09 0500) Weight change:  Last BM Date: 07/08/13 Intake/Output from previous day: -490 (-531 since admit)  11/08 0701 - 11/09 0700 In: 1410 [P.O.:960] Out: 1900 [Urine:1900] Intake/Output this shift: Total I/O In: 360 [P.O.:360] Out: -   PE: General:Pleasant affect, NAD Skin:Warm and dry, brisk capillary refill HEENT:normocephalic, sclera clear, mucus membranes moist Heart:S1S2 RRR with 2/6 systolic murmur, no gallup, rub or click Lungs: without rales, + rhonchi, no wheezes WUJ:WJXB, non tender, + BS, do not palpate liver spleen or masses Ext:no lower ext edema, 2+ pedal pulses, 2+ radial pulses Neuro:alert and oriented, MAE, follows commands, + facial symmetry   Lab Results:  Recent Labs  07/07/13 0441 07/09/13 0527  WBC 5.5 5.6  HGB 12.0* 12.4*  HCT 34.5* 34.8*  PLT 256 294   BMET  Recent Labs  07/08/13 0420 07/08/13 1240 07/09/13 0527  NA 141  --  138  K 2.9* 3.3* 3.2*  CL 110  --  109  CO2 22  --  20  GLUCOSE 94  --  101*  BUN 16  --  16  CREATININE 1.14  --  1.15  CALCIUM 8.1*  --  8.2*   No results found for this basename: TROPONINI, CK, MB,  in the last 72 hours       Studies/Results: No results found.  Medications: I have reviewed the patient's current medications. Scheduled Meds: . amoxicillin-clavulanate  1 tablet Oral Q8H  . antiseptic oral rinse  15 mL Mouth Rinse BID  . aspirin  81 mg Oral Daily  . atorvastatin  40 mg Oral q1800  . clopidogrel  75 mg Oral Q breakfast  . famotidine  20 mg Oral BID  . furosemide  40 mg Oral Daily  . metoprolol tartrate  25 mg Oral BID  . potassium chloride  10 mEq Intravenous Q1 Hr x 2  . potassium chloride  40  mEq Oral TID  . Rivaroxaban  15 mg Oral BID WC  . 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 Medical therapy  PLAN: no chest pain, no SOB.  NSTEMI treating medically, see cath report. If breakthrough pain may be candidate cutting balloon of LAD. EF 55%.  On ASA, PLAVIX, and Xarelto (DVT). Currently stable without pain Still hypokalemic to receive 2 more boluses   LOS: 7 days   Time spent with pt. :15 minutes. [facility] R  Nurse Practitioner Certified Pager [phone] 07/09/2013, 11:41 AM   Patient seen and examined. Agree with assessment and plan.  No chest pain. No PCI performed; medical therapy for CAD. Therefore, as long as pt is on xarelto, will dc Plavix and continue 81 mg ASA. Titrate bb as BP and P allow. K replete.    Lennette Bihari, MD, [facility] 07/09/2013 12:43 PM

## 2013-07-10 LAB — CBC
HCT: 38.7 % — ABNORMAL LOW (ref 39.0–52.0)
Hemoglobin: 13.4 g/dL (ref 13.0–17.0)
MCH: 29.3 pg (ref 26.0–34.0)
MCHC: 34.6 g/dL (ref 30.0–36.0)
Platelets: 345 10*3/uL (ref 150–400)
RBC: 4.58 MIL/uL (ref 4.22–5.81)
WBC: 6.3 10*3/uL (ref 4.0–10.5)

## 2013-07-10 LAB — BASIC METABOLIC PANEL
BUN: 17 mg/dL (ref 6–23)
Chloride: 108 mEq/L (ref 96–112)
Creatinine, Ser: 1.11 mg/dL (ref 0.50–1.35)
GFR calc non Af Amer: 66 mL/min — ABNORMAL LOW (ref >90)
Glucose, Bld: 93 mg/dL (ref 70–99)
Potassium: 3.6 mEq/L (ref 3.5–5.1)
Sodium: 138 mEq/L (ref 135–145)

## 2013-07-10 MED ORDER — METOPROLOL TARTRATE 12.5 MG HALF TABLET: 37.5000 mg | ORAL_TABLET | Freq: Two times a day (BID) | ORAL | Status: DC

## 2013-07-10 MED ORDER — RIVAROXABAN 15 MG PO TABS: 15.0000 mg | ORAL_TABLET | Freq: Two times a day (BID) | ORAL | Status: DC

## 2013-07-10 MED ORDER — AMOXICILLIN-POT CLAVULANATE 500-125 MG PO TABS: 1.0000 | ORAL_TABLET | Freq: Three times a day (TID) | ORAL | Status: DC

## 2013-07-10 MED ORDER — METOPROLOL TARTRATE 25 MG PO TABS: 25.0000 mg | ORAL_TABLET | Freq: Two times a day (BID) | ORAL | Status: DC

## 2013-07-10 MED ORDER — ASPIRIN 81 MG PO CHEW: 81.0000 mg | CHEWABLE_TABLET | Freq: Every day | ORAL | Status: DC

## 2013-07-10 MED ORDER — RIVAROXABAN 20 MG PO TABS: 20.0000 mg | ORAL_TABLET | Freq: Every day | ORAL | Status: DC

## 2013-07-10 MED ORDER — FAMOTIDINE 20 MG PO TABS: 20.0000 mg | ORAL_TABLET | Freq: Two times a day (BID) | ORAL | Status: DC

## 2013-07-10 MED ORDER — ALBUTEROL SULFATE (5 MG/ML) 0.5% IN NEBU: 2.5000 mg | INHALATION_SOLUTION | RESPIRATORY_TRACT | Status: DC | PRN

## 2013-07-10 MED ORDER — POTASSIUM CHLORIDE CRYS ER 20 MEQ PO TBCR: 40.0000 meq | EXTENDED_RELEASE_TABLET | Freq: Every day | ORAL | Status: DC

## 2013-07-10 MED ORDER — FUROSEMIDE 40 MG PO TABS: 40.0000 mg | ORAL_TABLET | Freq: Every day | ORAL | Status: DC

## 2013-07-10 NOTE — Progress Notes (Signed)
Physical Therapy Treatment Patient Details Name: Eric Zamora MRN: [id number] DOB: Nov 30, 1945 Today's Date: 07/10/2013 Time: 4098-1191 PT Time Calculation (min): 24 min  PT Assessment / Plan / Recommendation  History of Present Illness 67 y/o male with dementia and HOCM presented to the [facility] ED on 11/2 with several days of dyspnea. Noted to have an NSTEMI, severe sepsis, pneumonia vs acute CHF exacerbation, and an acute abdomen.   PT Comments   Pt with decreased awareness, episode of incontinence of stool and decreased awareness of LOB.  Pt will require 24 hour care at d/c.  Goals downgraded to supervision.  Follow Up Recommendations  SNF;Supervision/Assistance - 24 hour     Does the patient have the potential to tolerate intense rehabilitation     Barriers to Discharge        Equipment Recommendations       Recommendations for Other Services    Frequency Min 3X/week   Progress towards PT Goals Progress towards PT goals: Goals downgraded-see care plan  Plan      Precautions / Restrictions Precautions Precautions: Fall Restrictions Weight Bearing Restrictions: No   Pertinent Vitals/Pain No c/o pain.    Mobility  Bed Mobility Supine to Sit: 5: Supervision Details for Bed Mobility Assistance: cuing for safety with lines, pt incontinent of stool, no awareness of this, assisted with pericare. Transfers Sit to Stand: 5: Supervision Stand to Sit: 5: Supervision Ambulation/Gait Ambulation/Gait Assistance: 4: Min assist Ambulation Distance (Feet): 300 Feet Assistive device: None Ambulation/Gait Assistance Details: pt with ataxic gait, requires min-mod A to correct LOB during gait, pt with decreased/delayed balance reactions, decreased awareness of LOB    Exercises     PT Diagnosis:    PT Problem List:   PT Treatment Interventions:     PT Goals (current goals can now be found in the care plan section)    Visit Information  Last PT Received On: 07/10/13 Assistance  Needed: +1 History of Present Illness: 67 y/o male with dementia and HOCM presented to the [facility] ED on 11/2 with several days of dyspnea. Noted to have an NSTEMI, severe sepsis, pneumonia vs acute CHF exacerbation, and an acute abdomen.    Subjective Data      Cognition  Cognition Arousal/Alertness: Awake/alert Behavior During Therapy: Flat affect Overall Cognitive Status: No family/caregiver present to determine baseline cognitive functioning    Balance  Dynamic Standing Balance Dynamic Standing - Level of Assistance: 4: Min assist Dynamic Standing - Comments: Pt requested to get dressed prior to starting PT.  Pt performed standing and sitting balance with cuing for safety and sequencing, min A in standing for balance reactions with reaching to put on belt.  Able to perform seated balance with supervision  End of Session PT - End of Session Equipment Utilized During Treatment: Gait belt Activity Tolerance: Patient tolerated treatment well Patient left: in chair;with call bell/phone within reach   GP     [facility] 07/10/2013, 8:24 AM

## 2013-07-10 NOTE — Progress Notes (Signed)
DC orders received.  Patient stable with no S/S of distress.  Discharge medication and instructions reviewed with the patient and receiving nurse, [facility], at [facility].  Patient DC via ambulance to SNF.

## 2013-07-10 NOTE — Discharge Summary (Addendum)
Physician Discharge Summary  Patient ID: Eric Zamora MRN: [id number] DOB/AGE: [birth year] 67 y.o.  Admit date: 07/02/2013 Discharge date: 07/10/2013  Primary Care Physician:  No primary provider on file.  Final Discharge Diagnoses:   . Acute respiratory failure with severe sepsis (due to community-acquired pneumonia)- resolved   Secondary diagnosis  . NSTEMI (non-ST elevated myocardial infarction) . 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 HTN (hypertension) . Bipolar disorder (manic depression) . Dyslipidemia . Acute on chronic renal insufficiency . Hypokalemia . Severe sepsis . Adynamic ileus . Left leg DVT . Abnormal nuclear cardiac imaging test - Intermediate Risk . CAD- Moderate LAD disease with moderate to severe ostial first diagonal disease by cath 07/2013 - not ammendable to PCI. Medical therapy . Pneumonia  Consults:  Cardiology,  [facility], Dr. Rennis Golden, Dr. Tresa Endo                    Pulmonary critical care (patient was under critical care service from admission on 07/02/2013 until 07/04/2013)   Recommendations for Outpatient Follow-up:  1) recheck BMET on 07/12/2013 and titrate oral potassium dose  2) Patient is to be on xarelto for 3 months,  continue 15mg  BID till 07/27/2013, then  20 mg with supper, starting on 07/28/2013 total of 3 months.   - while patient is on xarelto, continue aspirin 81 mg daily -  Once OFF the xarelto, patient needs to be placed on Plavix 75 mg daily with aspirin 81 mg daily  3) please make an appointment with Dr. Tresa Endo, [facility] cardiology    Allergies:  No Known Allergies   Discharge Medications:   Medication List         albuterol (5 MG/ML) 0.5% nebulizer solution  Commonly known as:  PROVENTIL  Take 0.5 mLs (2.5 mg total) by nebulization every 3 (three) hours as needed for wheezing or shortness of breath.     amoxicillin-clavulanate 500-125 MG per tablet  Commonly known as:  AUGMENTIN  Take 1  tablet (500 mg total) by mouth every 8 (eight) hours. X 4 more days     aspirin 81 MG chewable tablet  Chew 1 tablet (81 mg total) by mouth daily.     famotidine 20 MG tablet  Commonly known as:  PEPCID  Take 1 tablet (20 mg total) by mouth 2 (two) times daily.     furosemide 40 MG tablet  Commonly known as:  LASIX  Take 1 tablet (40 mg total) by mouth daily.     metoprolol tartrate 25 MG tablet  Commonly known as:  LOPRESSOR  Take 1 tablet (25 mg total) by mouth 2 (two) times daily.     potassium chloride SA 20 MEQ tablet  Commonly known as:  K-DUR,KLOR-CON  Take 2 tablets (40 mEq total) by mouth daily.     Rivaroxaban 15 MG Tabs tablet  Commonly known as:  XARELTO  Take 1 tablet (15 mg total) by mouth 2 (two) times daily with a meal. Till 07/27/13, then start 20mg  daily     Rivaroxaban 20 MG Tabs tablet  Commonly known as:  XARELTO  Take 1 tablet (20 mg total) by mouth daily with supper. Start on 07/28/13, for total of 3 months  Start taking on:  07/28/2013     simvastatin 10 MG tablet  Commonly known as:  ZOCOR  Take 10 mg by mouth at bedtime.         Brief H and P: For complete details please refer to admission  H and P, but in brief patient is a 67 year old male with dementia, hypertrophic cardiomyopathy presented to ED on 07/02/2013 with several days of dyspnea. Patient was noticed to have end STEMI with severe sepsis possibly due to pneumonia versus acute CHF at the time of admission and acute abdomen. Patient was admitted by critical care service Dr. Kendrick Fries.  His family had noted that this had been going on for nearly a week and he they had been trying to get him to the hospital but he was refusing. Apparently he is a percent at baseline and had not noticed abdominal pain despite increasing abdominal distention, he had denied any chest pain.    Hospital Course:  Patient is a 67 year old male with dementia and HOCM presented to [facility] on 07/02/2013 with  several days of dyspnea, was found to have end STEMI with severe sepsis, pneumonia versus acute CHF exacerbation. Patient was admitted by critical care service to ICU.  Acute hypoxemic resp failure due pulmonary edema : Resolved. Initially he required BiPAP support, family had not wanted intubation. He was placed on BiPAP support, likely his respiratory status had worsened due to severe sepsis from severe community acquired pneumonia and NSTEMI. Respiratory status is currently stable, patient is on room air and not requiring any oxygen supplementation. Please continue Augmentin for 4 more days , albuterol nebs as needed.  Severe sepsis possibly from severe CAP  Patient was initially started on IV antibiotics, was transitioned to Augmentin by pulmonology, patient is to complete 4 more days.  NSTEMI likely from demand from sepsis with history of obstructive cardiomyopathy/HOCM:   Patient was critically ill at the time of admission, requiring BiPAP he did not have any chest pain at the time of admission but appeared to have end STEMI possibly from pneumonia in severe sepsis, had clear signs of heart failure. Cardiology was consulted and patient was started on diuresis. 2-D echo on 07/04/1999 protein showed EF of 55-60%, grade 2 diastolic dysfunction, mild pulmonary hypertension, increased right ventricle systolic pressure. Patient underwent lexi-scan stress test on 07/05/13 which revealed reversibility in the anteroseptal wall and a prior fixed defect. Patient underwent cardiac cath on 07/06/13 which showed moderate LAD- diagonal branch disease, medical treatment recommended, may need PCI if he develops symptoms (angina, CHF). He was recommended aggressive medical therapy by cardiology. Currently patient is on xarelto ( 3 months ) for right peroneal DVT, hence per cardiology, continue aspirin 81 mg daily with xarelto. Once patient has completed the course of xarelto after 3 months, he needs to be on aspirin 81  mg daily and Plavix 75 mg daily.  Severe Hypokalemia possibly due to diuresis, potassium was aggressively. Currently 3.6, he is on a Lasix. Please obtain BMET on 07/12/13, to follow potassium level and titrate oral potassium replacement.  Ileus: CT abdomen at the time of admission showed dilated bowel most likely adynamic ileus but no obstruction or perforation. Currently resolved, patient is tolerating oral diet without any difficulty.    Rt peroneal DVT : Doppler ultrasound on 07/03/2013 showed DVT in the right peroneal vein, recommend 3months of anticoagulation therapy for DVT. He was placed on xarelto. Per cardiology, he is recommended aspirin 81 mg daily with xarelto due to a coronary artery disease. However when he finishes xarelto after 3 months, patient is to be placed on aspirin 81 mg daily with the Plavix 75 mg daily.  HTN Well-controlled at present   Baseline dementia - severe /Baseline bipolar disorder: patient is currently stable,  recommended skilled nursing facility for 24/ 7 supervision By physical therapy  Significant events/studies 11/2: NSTEMI, likely demand from sepsis  11/2: CT abdomen>pneumonia v atelectasis, dilated bowel loops. Started BiPAP  11/3: To O2 by [place]  11/3 LE duplex > Rt peroneal DVT 11/6  cardiac catheterization   Left Ventriculography:  EF: 55 %  Wall Motion: no obvious regional WMA Coronary Anatomy:  Left Main: Large Caliber Vessel that bifurcates into the LAD & Circumflex, but also gives off a tandem Ramus Intermedius vessel. LAD: Large caliber vessel with several mild luminal irregularities proximally, but most notably with a ~50-60% focal stenosis just after SP1 & before the major first Diagonal (D1). This is followed by a log tubular ~30% stenosis before normalizing after another SP trunk. The vessel then continues with minimal luminal irregularties around the apex.  D1: Moderate to large caliber vessel that comes off of the mid LAD just aftet the  focal stenosis. There is an ostial ~70% stenosis (worse in steep RAO Cranial). Left Circumflex: Large Caliber vessel that bifurcates in the mid vessel into a Large Lateral OM1 and the AV Groove branch that bifurcates distally into LPL1 & 2.  OM1: Large caliber, tortuous vessel that reaches out to the inferolateral apex; minimal luminal irregularities. Ramus intermedius: Tandem vessels. The more Diagonal branch is moderate in caliber, while the more OM branch is small. Angiographically normal.  RCA: Large caliber dominant vessel that has several major bends and is tortuous with 2 RV marginal branches. It bifurcates distally into the Right Posterior Descending Artery (RPDA) & the Right Posterior AV Groove Branch (RPAV). Minimal luminal irregularities.  RPDA: Moderate caliber vessel that bifurcates distally. Ostial ~40%.  RPL Sysytem:The RPAV is a large caliber vessel that gives off 2 small RPL1 & 3 vessels with a moderate to large caliber RPL2. Angiographically normal.   POST-OPERATIVE DIAGNOSIS:  Moderate LAD disease with moderate to severe ostial first diagonal disease that is likely the culprit lesion. Based on the ostial nature of this stenosis that is correlating with the LAD lesion, this is not a great stent-type PCI option. Scoring are Cutting Balloon angioplasty would be the only option for this vessel, which would not guarantee an adequate result, and could potentially lead jeopardizing the LAD and lead to a stent being placed in the LAD that is not required.  Well-preserved LV EF with normal EDP.  Day of Discharge BP 113/66  Pulse 78  Temp(Src) 98.8 F (37.1 C) (Oral)  Resp 20  Ht 5\' 5"  (1.651 m)  Wt 64 kg (141 lb 1.5 oz)  BMI 23.48 kg/m2  SpO2 100%  Physical Exam:  General: Alert and awake, oriented x3, NAD.  CVS: S1-S2 clear, 3/6 SEM at LUSB  Chest: CTAB  Abdomen: soft NT, ND, NBS  Extremities: no c/c/e bilaterally   The results of significant diagnostics from this  hospitalization (including imaging, microbiology, ancillary and laboratory) are listed below for reference.    LAB RESULTS: Basic Metabolic Panel:  Recent Labs Lab 07/09/13 0527 07/10/13 0615  NA 138 138  K 3.2* 3.6  CL 109 108  CO2 20 23  GLUCOSE 101* 93  BUN 16 17  CREATININE 1.15 1.11  CALCIUM 8.2* 8.7  MG 2.0  --    Liver Function Tests:  Recent Labs Lab 07/06/13 0430  AST 40*  ALT 32  ALKPHOS 25*  BILITOT 0.4  PROT 6.0  ALBUMIN 2.6*   No results found for this basename: LIPASE, AMYLASE,  in the last  168 hours No results found for this basename: AMMONIA,  in the last 168 hours CBC:  Recent Labs Lab 07/09/13 0527 07/10/13 0615  WBC 5.6 6.3  HGB 12.4* 13.4  HCT 34.8* 38.7*  MCV 83.7 84.5  PLT 294 345   Cardiac Enzymes: No results found for this basename: CKTOTAL, CKMB, CKMBINDEX, TROPONINI,  in the last 168 hours BNP: No components found with this basename: POCBNP,  CBG: No results found for this basename: GLUCAP,  in the last 168 hours  Significant Diagnostic Studies:  Ct Abdomen Pelvis Wo Contrast  07/02/2013   CLINICAL DATA:  Acute abdominal pain. Sepsis.  Renal insufficiency.  EXAM: CT ABDOMEN AND PELVIS WITHOUT CONTRAST  TECHNIQUE: Multidetector CT imaging of the abdomen and pelvis was performed following the standard protocol without intravenous contrast.  COMPARISON:  None.  FINDINGS: Images through the lung bases show small bilateral pleural effusions and bibasilar atelectasis.  Diffuse gaseous distention of small bowel and colon seen, consistent with ileus. Noncontrast images of the liver, pancreas, spleen, adrenal glands, and kidneys are normal in appearance. No evidence of hydronephrosis.  No soft tissue masses or lymphadenopathy identified within the abdomen or pelvis. No evidence of inflammatory process or abnormal fluid collections.  IMPRESSION: Diffuse gaseous distention of small bowel and colon, consistent with adynamic ileus.  No evidence of  intra-abdominal mass, inflammatory process, or abscess. .  Bilateral pleural effusions and bibasilar atelectasis.   Electronically Signed   By: Myles Rosenthal M.D.   On: 07/02/2013 16:48   Dg Chest Port 1 View  07/02/2013   CLINICAL DATA:  Shortness of breath. Cough, congestion.  EXAM: PORTABLE CHEST - 1 VIEW  COMPARISON:  08/03/2008  FINDINGS: Heart size is normal. There is significant left lung infiltrate, implying the upper and lower lobes. Minimal right lower lobe atelectasis identified. No pulmonary edema.  IMPRESSION: Left upper and lower lobe infiltrates.   Electronically Signed   By: Rosalie Gums M.D.   On: 07/02/2013 12:35    2D ECHO: Study Conclusions  - Left ventricle: The cavity size was normal. Wall thickness was increased in a pattern of mild LVH. Mild dysproportionate upper septal thickening with focal region of mid septal thinning. Systolic function was normal. The estimated ejection fraction was in the range of 55% to 60%. Wall motion was normal; there were no regional wall motion abnormalities. Features are consistent with a pseudonormal left ventricular filling pattern, with concomitant abnormal relaxation and increased filling pressure (grade 2 diastolic dysfunction). Doppler parameters are consistent with elevated mean left atrial filling pressure. - Aortic valve: Trileaflet; mildly thickened, mildly calcified leaflets with focal sclerosis of the left coronary cusp Trivial regurgitation. - Mitral valve: Mildly calcified annulus. Mild to moderate regurgitation. - Left atrium: The atrium was mildly dilated. - Right ventricle: Systolic pressure was increased. - Atrial septum: No defect or patent foramen ovale was identified. - Pulmonary arteries: PA peak pressure: 43mm Hg (S). Impressions:  - The right ventricular systolic pressure was increased consistent with mild pulmonary hypertension.    Disposition and Follow-up: Discharge Orders   Future Appointments  Provider Department Dept Phone   07/25/2013 2:00 PM Brittainy Delmer [facility] [phone]   Future Orders Complete By Expires   (HEART FAILURE PATIENTS) Call MD:  Anytime you have any of the following symptoms: 1) 3 pound weight gain in 24 hours or 5 pounds in 1 week 2) shortness of breath, with or without a dry hacking cough 3) swelling in the hands,  feet or stomach 4) if you have to sleep on extra pillows at night in order to breathe.  As directed    Diet - low sodium heart healthy  As directed    Discharge instructions  As directed    Comments:     1) Please check BMET for renal function and K on 07/12/13. 2) Please continue xarelto and aspirin 81mg  daily for 3 months. Once xarelto is completed, place on plavix 75mg  daily with aspirin 81mg  daily.   Increase activity slowly  As directed        DISPOSITION: Skilled nursing facility  DIET: Heart healthy diet ACTIVITY: As tolerated  TESTS THAT NEED FOLLOW-UP BMET on 07/12/13  DISCHARGE FOLLOW-UP Follow-up Information   Follow up with Robbie Lis, PA-C On 07/25/2013. (2:00 pm)    Specialty:  Cardiology   Contact information:   [address] [place] [address] [phone]       Time spent on Discharge: 45 mins  Signed:   RAI,RIPUDEEP M.D. [facility] 07/10/2013, 1:52 PM Pager: [phone]

## 2013-07-10 NOTE — Progress Notes (Signed)
Clinical social worker assisted with patient discharge to skilled nursing facility, [facility].  CSW addressed all family questions and concerns. CSW copied chart and added all important documents. CSW also set up patient transportation with [facility]. Clinical Social Worker will sign off for now as social work intervention is no longer needed.   Sallee Hogrefe, MSW, LCSWA [phone] 

## 2013-07-10 NOTE — Progress Notes (Signed)
Subjective: No complaints.   Objective: Vital signs in last 24 hours: Temp:  [97.9 F (36.6 C)-99.2 F (37.3 C)] 98.9 F (37.2 C) (11/10 0500) Pulse Rate:  [69-81] 74 (11/10 0936) Resp:  [18] 18 (11/10 0500) BP: (91-129)/(53-67) 91/58 mmHg (11/10 0936) SpO2:  [97 %-100 %] 100 % (11/10 0500) Last BM Date: 07/10/13  Intake/Output from previous day: 11/09 0701 - 11/10 0700 In: 840 [P.O.:840] Out: 1901 [Urine:1900; Stool:1] Intake/Output this shift: Total I/O In: 240 [P.O.:240] Out: -   Medications Current Facility-Administered Medications  Medication Dose Route Frequency Provider Last Rate Last Dose  . albuterol (PROVENTIL) (5 MG/ML) 0.5% nebulizer solution 2.5 mg  2.5 mg Nebulization Q3H PRN Lupita Leash, MD      . amoxicillin-clavulanate (AUGMENTIN) 500-125 MG per tablet 500 mg  1 tablet Oral Q8H Calvert Cantor, MD   500 mg at 07/10/13 0522  . antiseptic oral rinse (BIOTENE) solution 15 mL  15 mL Mouth Rinse BID Lupita Leash, MD   15 mL at 07/10/13 0800  . aspirin chewable tablet 81 mg  81 mg Oral Daily Lupita Leash, MD   81 mg at 07/10/13 0935  . atorvastatin (LIPITOR) tablet 40 mg  40 mg Oral q1800 Lennette Bihari, MD   40 mg at 07/09/13 1717  . famotidine (PEPCID) tablet 20 mg  20 mg Oral BID Leona Singleton, MD   20 mg at 07/10/13 0935  . furosemide (LASIX) tablet 40 mg  40 mg Oral Daily Brittainy Simmons, PA-C   40 mg at 07/09/13 0957  . metoprolol tartrate (LOPRESSOR) tablet 37.5 mg  37.5 mg Oral BID Lennette Bihari, MD   37.5 mg at 07/09/13 2311  . potassium chloride SA (K-DUR,KLOR-CON) CR tablet 40 mEq  40 mEq Oral TID Ripudeep Jenna Luo, MD   40 mEq at 07/10/13 0935  . Rivaroxaban (XARELTO) tablet 15 mg  15 mg Oral BID WC Hessie Diener Kiana, RPH   15 mg at 07/10/13 0745  . [START ON 07/28/2013] Rivaroxaban (XARELTO) tablet 20 mg  20 mg Oral Q supper Fredrik Rigger, [facility]        PE: General appearance: alert, cooperative and no distress Lungs:  clear to auscultation bilaterally Heart: regular rate and rhythm and 3/6 SM Extremities: no LEE Pulses: 2+ and symmetric Skin: warm and dry Neurologic: Grossly normal  Lab Results:   Recent Labs  07/09/13 0527 07/10/13 0615  WBC 5.6 6.3  HGB 12.4* 13.4  HCT 34.8* 38.7*  PLT 294 345   BMET  Recent Labs  07/08/13 0420 07/08/13 1240 07/09/13 0527 07/10/13 0615  NA 141  --  138 138  K 2.9* 3.3* 3.2* 3.6  CL 110  --  109 108  CO2 22  --  20 23  GLUCOSE 94  --  101* 93  BUN 16  --  16 17  CREATININE 1.14  --  1.15 1.11  CALCIUM 8.1*  --  8.2* 8.7     Assessment/Plan  Principal Problem:   Pneumonia Active Problems:   Acute respiratory failure   NSTEMI (non-ST elevated myocardial infarction)   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 Medical therapy  Plan: Stable. On medical therapy for CAD. ASA, BB, statin. Plan is for d/c back to SNF today. Will arrange OP cardiology appointment.     LOS: 8 days    Brittainy M. Sharol Harness, PA-C 07/10/2013 11:08 AM  I have seen and examined the patient along with Brittainy M. Sharol Harness, PA-C.  I have reviewed the chart, notes and new data.  I agree with PA/NP's note.  Key new complaints: none Key examination changes: hypotension, not symptomatic Key new findings / data: normal renal parameters yesterday  PLAN: Stop diuretic and reduce beta blocker back to 25 mg BID. He does not tolerate the higher dose of beta blocker and he does not have angina. If he does develop angina, he will be best treated with PCI rather than  antianginal therapy escalation.  We will arrange early follow up in the office if discharged today.  Thurmon Fair, MD, [facility] [phone] 07/10/2013, 1:15 PM

## 2013-07-10 NOTE — Progress Notes (Signed)
I spoke with the patient Eric Zamora today and he was quiet for the most part  in my actual visit with him. I noticed he was watching TV as I was conversing with him and he never really made eye contact with me as he was answering my questions during our conservation. However, he did share with me he is happy that he will be released from the hospital very soon.  Chaplain Bryson Ha

## 2013-07-11 ENCOUNTER — Non-Acute Institutional Stay (SKILLED_NURSING_FACILITY): Payer: Medicare Other | Admitting: Internal Medicine

## 2013-07-11 DIAGNOSIS — I509 Heart failure, unspecified: Secondary | ICD-10-CM

## 2013-07-11 DIAGNOSIS — I82409 Acute embolism and thrombosis of unspecified deep veins of unspecified lower extremity: Secondary | ICD-10-CM

## 2013-07-11 DIAGNOSIS — I2510 Atherosclerotic heart disease of native coronary artery without angina pectoris: Secondary | ICD-10-CM

## 2013-07-11 DIAGNOSIS — I82401 Acute embolism and thrombosis of unspecified deep veins of right lower extremity: Secondary | ICD-10-CM

## 2013-07-11 DIAGNOSIS — I5021 Acute systolic (congestive) heart failure: Secondary | ICD-10-CM

## 2013-07-11 NOTE — Progress Notes (Signed)
Patient ID: Eric Zamora, male   DOB: 11-15-1945, 67 y.o.   MRN: [id number]   Facility; [facility] Chief complaint; admission to SNF post admit to [facility]  from November 2 to November 10    ------------------------------------------------------------ Prepared and Electronically Authenticated by  this is a gentleman who lives with his sister. He had apparently been refusing to go to the hospital for a week prior to admission. On arrival to the emergency room he was noted to have an ST elevation MI with probable sepsis due to pneumonia plus or minus CHF. He was initially managed in the ICU by critical care medicine. He was given IV antibiotics and then transitioned to Augmentin. He required BiPAP support his family did not want intubation. The patient underwent a 2-D echo which showed an EF of 55-60%, grade 2 diastolic dysfunction, mild pulmonary hypertension with increased right ventricular systolic pressure. Patient underwent a cardiac cath on November 6 which showed a moderate lesion in the LAD diagonal branch disease. He was recommended for aggressive medical care. He developed a DVT and hence is on xarelto along with aspirin 81 mg. After completion of the xarelto in 3 months he will need to be put on aspirin 81 and platelets 75 mg per the discharge summary    EXAM: CT ABDOMEN AND PELVIS WITHOUT CONTRAST   TECHNIQUE: Multidetector CT imaging of the abdomen and pelvis was performed following the standard protocol without intravenous contrast.   COMPARISON:  None.   FINDINGS: Images through the lung bases show small bilateral pleural effusions and bibasilar atelectasis.   Diffuse gaseous distention of small bowel and colon seen, consistent with ileus. Noncontrast images of the liver, pancreas, spleen, adrenal glands, and kidneys are normal in appearance. No evidence of hydronephrosis.   No soft tissue masses or lymphadenopathy identified within the abdomen or pelvis. No  evidence of inflammatory process or abnormal fluid collections.   IMPRESSION: Diffuse gaseous distention of small bowel and colon, consistent with adynamic ileus.   No evidence of intra-abdominal mass, inflammatory process, or abscess. .   Bilateral pleural effusions and bibasilar atelectasis.        ------------------------------------------------------------ Transthoracic Echocardiography  Patient:    Eric Zamora, Eric Zamora MR #:       [id number] Study Date: 07/03/2013 Gender:     M Age:        43 Height:     165.1cm Weight:     63.5kg BSA:        1.30m^2 Pt. Status: Room:       2M13C    PERFORMING   [facility]  ORDERING     Abelino Derrick.  ATTENDING    Max Fickle  SONOGRAPHER  [place], RDCS, CCT cc:  ------------------------------------------------------------ LV EF: 55% -   60%  ------------------------------------------------------------ Indications:      MI - acute 410.91.  ------------------------------------------------------------ Study Conclusions  - Left ventricle: The cavity size was normal. Wall thickness   was increased in a pattern of mild LVH. Mild   dysproportionate upper septal thickening with focal region   of mid septal thinning. Systolic function was normal. The   estimated ejection fraction was in the range of 55% to   60%. Wall motion was normal; there were no regional wall   motion abnormalities. Features are consistent with a   pseudonormal left ventricular filling pattern, with   concomitant abnormal relaxation and increased filling   pressure (grade 2 diastolic dysfunction). Doppler   parameters are consistent with elevated mean  left atrial   filling pressure. - Aortic valve: Trileaflet; mildly thickened, mildly   calcified leaflets with focal sclerosis of the left   coronary cusp Trivial regurgitation. - Mitral valve: Mildly calcified annulus. Mild to moderate   regurgitation. - Left atrium: The atrium was mildly dilated. -  Right ventricle: Systolic pressure was increased. - Atrial septum: No defect or patent foramen ovale was   identified. - Pulmonary arteries: PA peak pressure: 43mm Hg (S). Impressions:  - The right ventricular systolic pressure was increased   consistent with mild pulmonary hypertension. Transthoracic echocardiography.  M-mode, complete 2D, spectral Doppler, and color Doppler.  Height:  Height: 165.1cm. Height: 65in.  Weight:  Weight: 63.5kg. Weight: 139.7lb.  Body mass index:  BMI: 23.3kg/m^2.  Body surface area:    BSA: 1.34m^2.  Patient status:  Inpatient. Location:  ICU/CCU  ------------------------------------------------------------  ------------------------------------------------------------ Left ventricle:  The cavity size was normal. Wall thickness was increased in a pattern of mild LVH. Mild dysproportionate upper septal thickening with focal region of mid septal thinning. Systolic function was normal. The estimated ejection fraction was in the range of 55% to 60%. Wall motion was normal; there were no regional wall motion abnormalities. Early diastolic septal annular tissue Doppler velocities Ea were abnormal. Features are consistent with a pseudonormal left ventricular filling pattern, with concomitant abnormal relaxation and increased filling pressure (grade 2 diastolic dysfunction). Doppler parameters are consistent with elevated mean left atrial filling pressure.  ------------------------------------------------------------ Aortic valve:  Trileaflet; mildly thickened, mildly calcified leaflets with focal sclerosis of the left coronary cusp  Doppler:   There was no stenosis.    Trivial regurgitation.  ------------------------------------------------------------ Aorta:  The aorta was normal, not dilated, and non-diseased.   ------------------------------------------------------------ Mitral valve:   Mildly calcified annulus.  Doppler:   Mild to moderate  regurgitation.    Peak gradient: 6mm Hg (D).  ------------------------------------------------------------ Left atrium:  The atrium was mildly dilated.  ------------------------------------------------------------ Atrial septum:  No defect or patent foramen ovale was identified.  ------------------------------------------------------------ Right ventricle:  The cavity size was normal. Systolic function was normal. Systolic pressure was increased.  ------------------------------------------------------------ Pulmonic valve:    The valve appears to be grossly normal.   ------------------------------------------------------------ Tricuspid valve:   Doppler:   Trivial regurgitation.  ------------------------------------------------------------ Pulmonary artery:   Poorly visualized.  ------------------------------------------------------------ Right atrium:  The atrium was normal in size.  ------------------------------------------------------------ Pericardium:  There was no pericardial effusion.  ------------------------------------------------------------ Systemic veins: Inferior vena cava: The vessel was normal in size; the respirophasic diameter changes were in the normal range (= 50%); findings are consistent with normal central venous pressure.  ------------------------------------------------------------ Post procedure conclusions Ascending Aorta:  - The aorta was normal, not dilated, and non-diseased.  ------------------------------------------------------------  2D measurements        Normal  Doppler               Normal Left ventricle                 measurements LVID ED,   42.7 mm     43-52   Main pulmonary chord,                         artery PLAX                           Pressure, S    43 mm  =30 LVID ES,   30.4 mm  23-38                     Hg chord,                         Left ventricle PLAX                           Ea, lat      8.96 cm/  ------- FS, chord,   29 %      >29     ann, tiss         s PLAX                           DP LVPW, ED   10.8 mm     ------  E/Ea, lat    13.5     ------- IVS/LVPW   0.86        <1.3    ann, tiss ratio, ED                      DP Ventricular septum             Ea, med      4.93 cm/ ------- IVS, ED    9.26 mm     ------  ann, tiss         s Aorta                          DP Root diam,   34 mm     ------  E/Ea, med   24.54     ------- ED                             ann, tiss Left atrium                    DP AP dim       41 mm     ------  Mitral valve AP dim     2.41 cm/m^2 <2.2    Peak E vel    121 cm/ ------- index                                            s Vol, S       85 ml     ------  Peak A vel   79.5 cm/ ------- Vol index,   50 ml/m^2 ------                    s S                              Deceleratio   204 ms  150-230                                n time                                Peak  6 mm  -------                                gradient, D       Hg                                Peak E/A      1.5     -------                                ratio                                Tricuspid valve                                Regurg peak   309 cm/ -------                                vel               s                                Peak RV-RA     38 mm  -------                                gradient, S       Hg                                Systemic veins                                Estimated       5 mm  -------                                CVP               Hg                                Right ventricle                                Pressure, S    43 mm  <30                                                   Hg   ------------------------------------------------------------ Prepared and Electronically Authenticated by    has a past medical history of HTN (hypertension); Obstructive cardiomyopathy (Jan 2010); Dyslipidemia; Bipolar affective  disorder; Chronic renal insufficiency; and Dementia.  Review  of Systems Respiratory; patient denies shortness of breath or cough Cardiac no exertional chest pain no palpitations GI no nausea vomiting or abdominal pain GU no dysuria or hematuria  Physical examination Gen. patient does not look unwell. He looked very disinterested with my presence almost answering questions before I asked him in terms of his review ofsystmes Vitals; pulse 93 O2 sat 92% on room air respiratory rate 18 Respiratory; shallow air entry bilaterally no crackles or wheezes Cardiac heart sounds are normal there is a harsh 3/6 systolic ejection murmur that does not radiate to his carotid Abdomen; no liver no spleen no tenderness Extremities no edema is noted likely has PAD     Impression/plan #1 coronary artery disease; ST elevation MI with congestive heart failure. Although this appears to be stable at this time. He had profound hypokalemia in the hospital this will need to be followed. #2 right peroneal vein DVT now on xarelto recommended for 3 months after which he transitions to playbacks 75 and aspirin 81 #3 probable COPD #4 obstructive hypertrophic cardiomyopathy #5 [place] is in the hospital this does not appear to be a currently apparent #6 sepsis from severe community-acquired pneumonia he has completed his antibiotics #7 hyperlipidemia on Zocor. #8 congestive heart failure on Lasix 40 mg per  The patient is already up ambulating in the room. He is listed as having dementia along not completely certain that this is the case. He answers questions very superficially however he is orientated to the year month name of the building he was just: Hospital. He has a history of bipolar affective disorder although this does not appear to be about unstable currently

## 2013-07-25 ENCOUNTER — Ambulatory Visit: Payer: Medicare Other | Admitting: Cardiology

## 2014-08-09 ENCOUNTER — Encounter (HOSPITAL_COMMUNITY): Payer: Self-pay | Admitting: Cardiology

## 2014-12-07 IMAGING — CT CT ABD-PELV W/O CM
2 of 4 series · 16 of 46 positions shown, 18 images · non-contrast
Comparison: None.

CLINICAL DATA: Acute abdominal pain. Sepsis.  Renal insufficiency.

EXAM:
CT ABDOMEN AND PELVIS WITHOUT CONTRAST
TECHNIQUE: Multidetector CT imaging of the abdomen and pelvis was performed
following the standard protocol without intravenous contrast.

[Series 2: abd/ pelvis 5.0 i30f 1 · axial · 0.65mm/px · z∈[+988,+1388]mm · 13 of 88 slices shown, 15 images]
[im 4/88  soft-tissue]
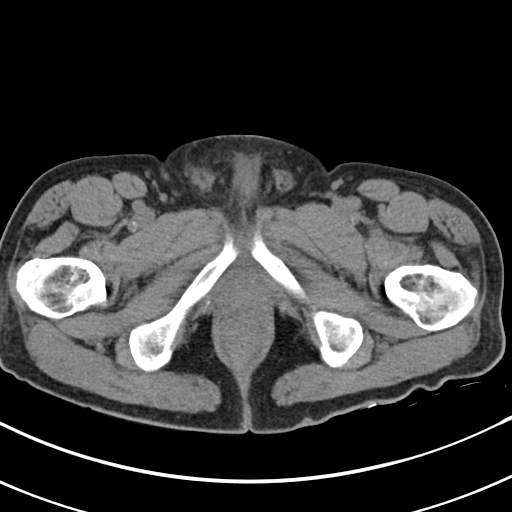
[im 4/88  bone]
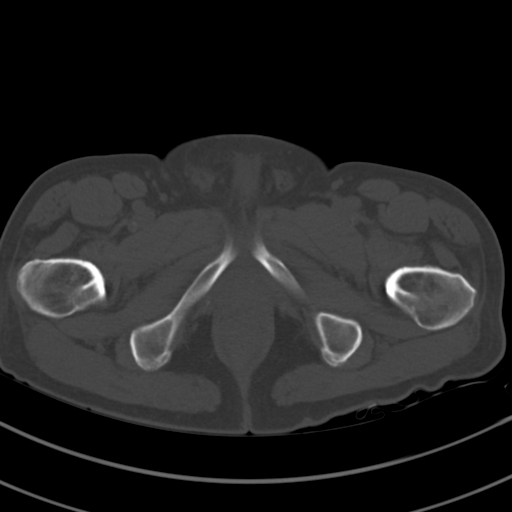
[im 11/88  soft-tissue]
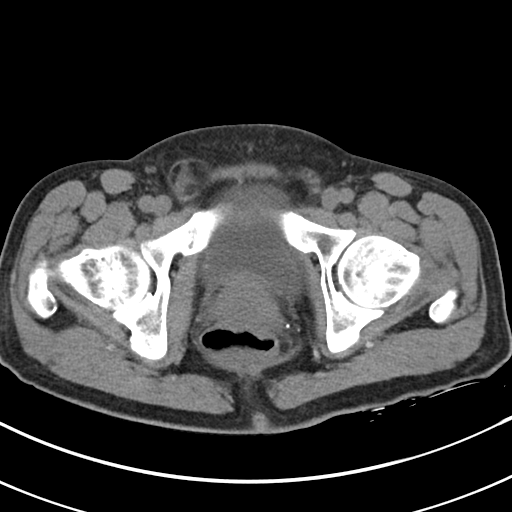
[im 19/88  soft-tissue]
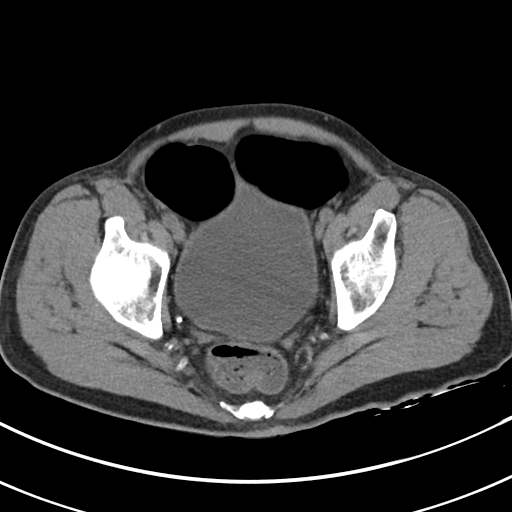
[im 26/88  soft-tissue]
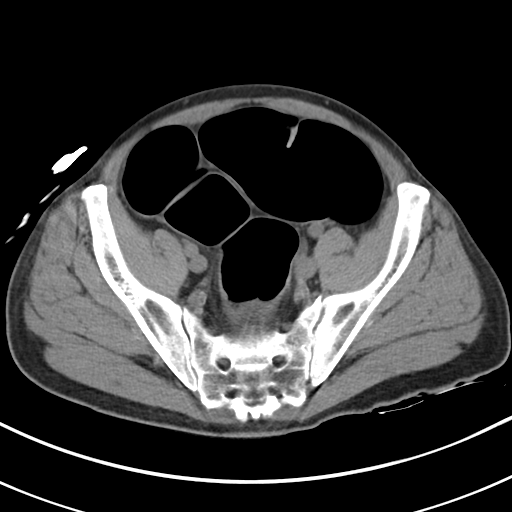
[im 30/88  soft-tissue]
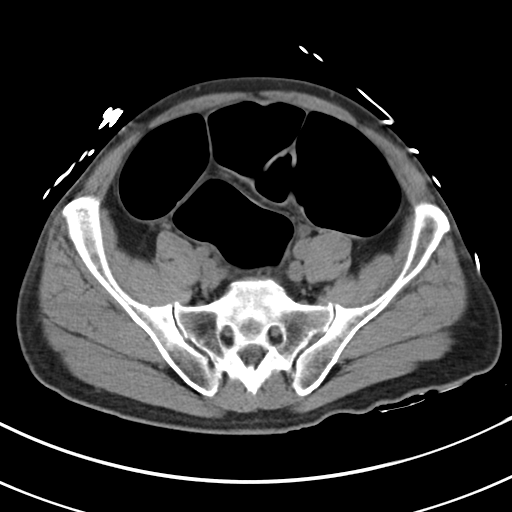
[im 37/88  soft-tissue]
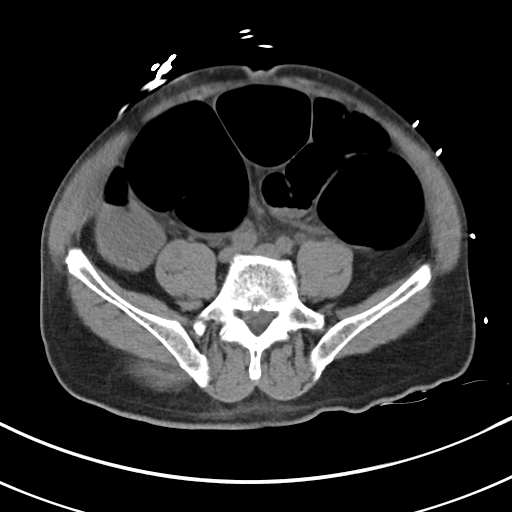
[im 44/88  soft-tissue]
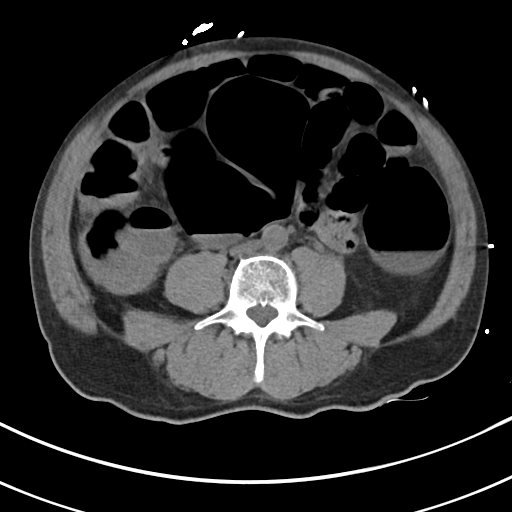
[im 51/88  soft-tissue]
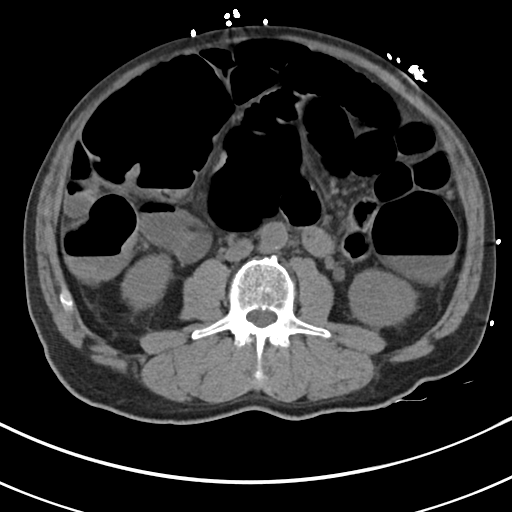
[im 59/88  soft-tissue]
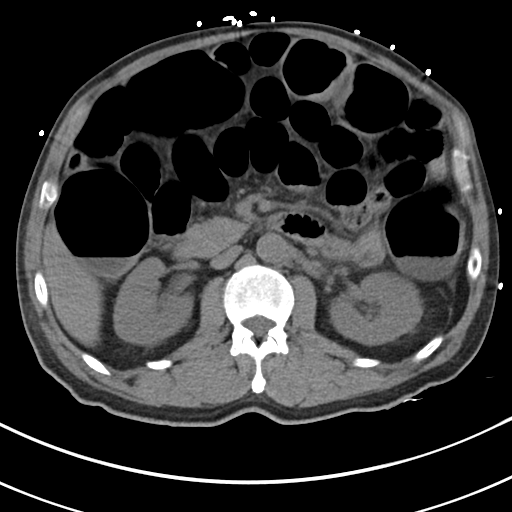
[im 59/88  bone]
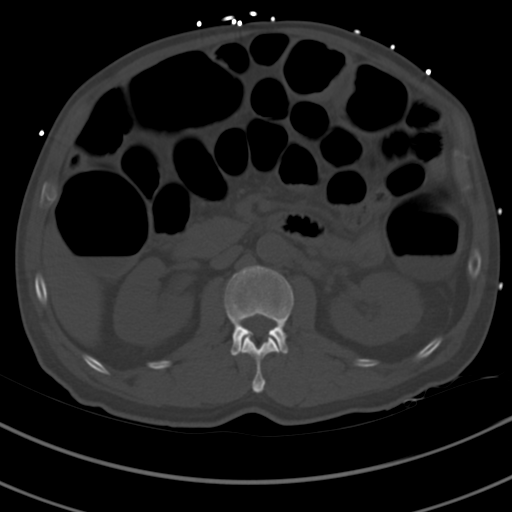
[im 62/88  soft-tissue]
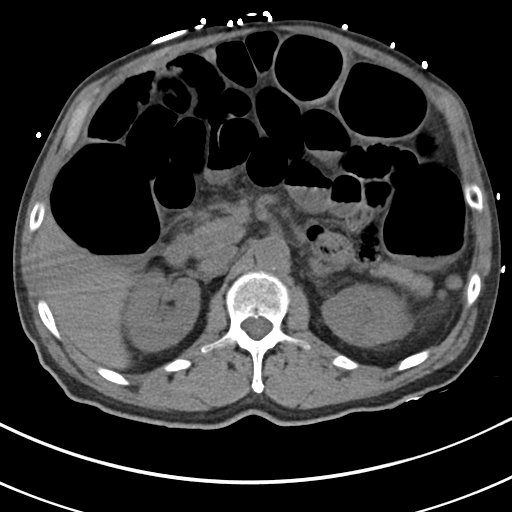
[im 69/88  soft-tissue]
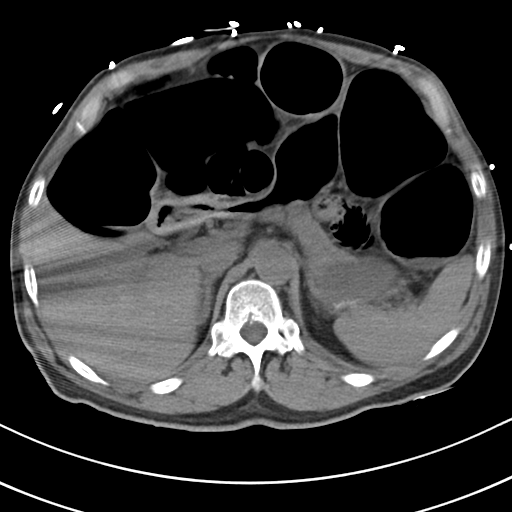
[im 77/88  soft-tissue]
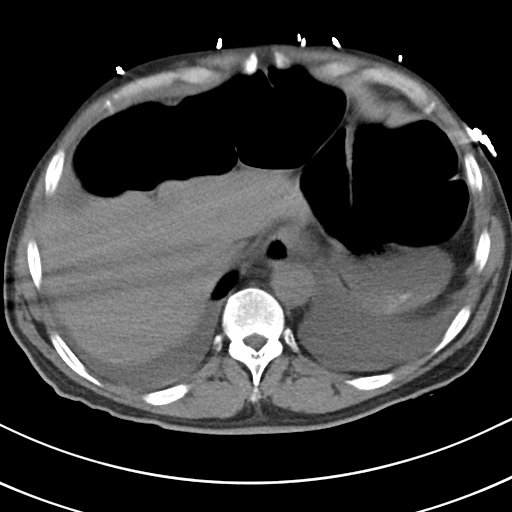
[im 84/88  soft-tissue]
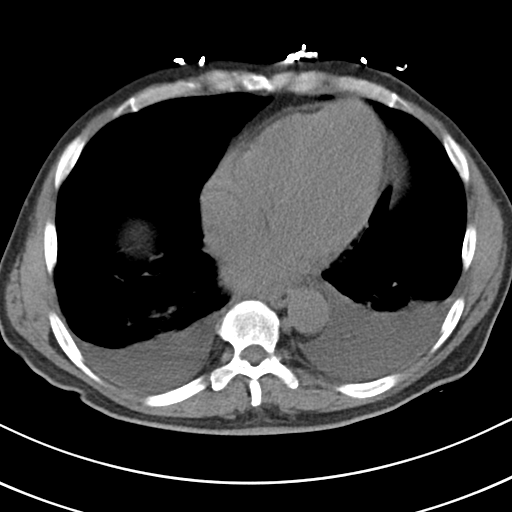

[Series 5: cor st · coronal · 0.65mm/px · 3 of 101 slices shown]
[im 34/101  soft-tissue]
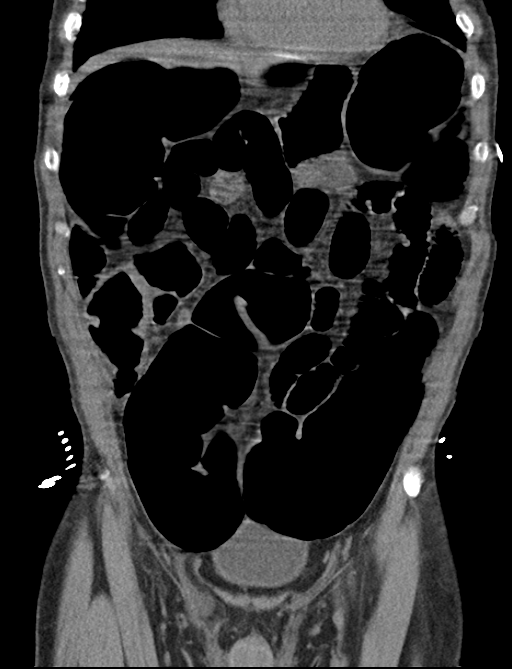
[im 45/101  soft-tissue]
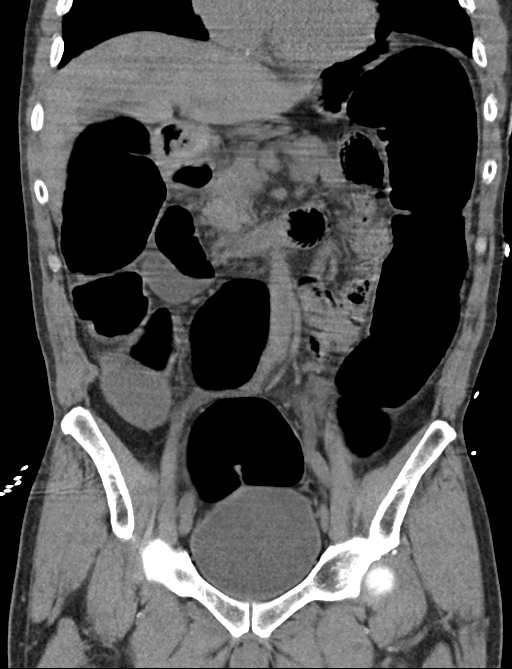
[im 56/101  soft-tissue]
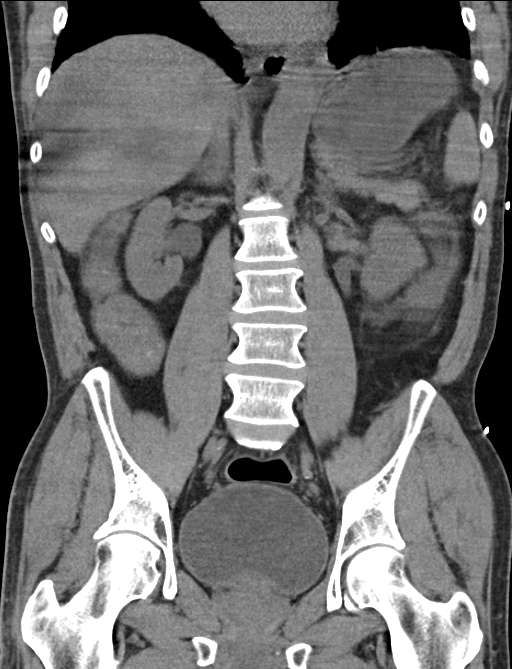

[16 of 46 positions shown; findings below may reference images not displayed]

FINDINGS: Images through the lung bases show small bilateral pleural effusions
and bibasilar atelectasis.

Diffuse gaseous distention of small bowel and colon seen, consistent
with ileus. Noncontrast images of the liver, pancreas, spleen,
adrenal glands, and kidneys are normal in appearance. No evidence of
hydronephrosis.

No soft tissue masses or lymphadenopathy identified within the
abdomen or pelvis. No evidence of inflammatory process or abnormal
fluid collections.
IMPRESSION: Diffuse gaseous distention of small bowel and colon, consistent with
adynamic ileus.

No evidence of intra-abdominal mass, inflammatory process, or
abscess. .

Bilateral pleural effusions and bibasilar atelectasis.

## 2016-04-12 ENCOUNTER — Emergency Department (HOSPITAL_COMMUNITY)
Admission: EM | Admit: 2016-04-12 | Discharge: 2016-04-12 | Disposition: A | Discharge status: Home / Self Care | Payer: Medicare Other | Attending: Emergency Medicine | Admitting: Emergency Medicine

## 2016-04-12 ENCOUNTER — Encounter (HOSPITAL_COMMUNITY): Payer: Self-pay | Admitting: *Deleted

## 2016-04-12 DIAGNOSIS — Y939 Activity, unspecified: Secondary | ICD-10-CM | POA: Insufficient documentation

## 2016-04-12 DIAGNOSIS — Z7901 Long term (current) use of anticoagulants: Secondary | ICD-10-CM | POA: Insufficient documentation

## 2016-04-12 DIAGNOSIS — I10 Essential (primary) hypertension: Secondary | ICD-10-CM | POA: Diagnosis not present

## 2016-04-12 DIAGNOSIS — I2510 Atherosclerotic heart disease of native coronary artery without angina pectoris: Secondary | ICD-10-CM | POA: Insufficient documentation

## 2016-04-12 DIAGNOSIS — Y999 Unspecified external cause status: Secondary | ICD-10-CM | POA: Insufficient documentation

## 2016-04-12 DIAGNOSIS — W19XXXA Unspecified fall, initial encounter: Secondary | ICD-10-CM | POA: Diagnosis not present

## 2016-04-12 DIAGNOSIS — Z7982 Long term (current) use of aspirin: Secondary | ICD-10-CM | POA: Diagnosis not present

## 2016-04-12 DIAGNOSIS — S0081XA Abrasion of other part of head, initial encounter: Secondary | ICD-10-CM | POA: Insufficient documentation

## 2016-04-12 DIAGNOSIS — I252 Old myocardial infarction: Secondary | ICD-10-CM | POA: Diagnosis not present

## 2016-04-12 DIAGNOSIS — Y929 Unspecified place or not applicable: Secondary | ICD-10-CM | POA: Insufficient documentation

## 2016-04-12 DIAGNOSIS — S0993XA Unspecified injury of face, initial encounter: Secondary | ICD-10-CM | POA: Diagnosis present

## 2016-04-12 NOTE — ED Provider Notes (Signed)
[facility]-EMERGENCY DEPT Provider Note   CSN: [id number] Arrival date & time: 04/12/16  2030  First Provider Contact:  First MD Initiated Contact with Patient 04/12/16 2031        History   Chief Complaint Chief Complaint  Patient presents with  . Fall    HPI Eric Zamora is a 70 y.o. male.  Patient s/p possible fall at ecf. EMS indicates that patient nurse this evening noted a small abrasion to forehead that wasn't there yesterday, so called to have evaluated.  Patient w hx dementia, and his current mental status is reported as c/w baseline.  Patient is awake and alert. Denies pain. No headache. No neck or back pain. Denies any extremity pain or injury. Patient indicates he feels fine, no symptoms.    The history is provided by the patient and the EMS personnel.    Past Medical History:  Diagnosis Date  . Bipolar affective disorder (HCC)   . Chronic renal insufficiency   . Dementia   . Dyslipidemia   . HTN (hypertension)   . Obstructive cardiomyopathy [facility](HCC) Jan 2010   46mmhg gradient on TEE    Patient Active Problem List   Diagnosis Date Noted  . Pneumonia 07/08/2013  . CAD- Moderate LAD disease with moderate to severe ostial first diagonal disease by cath 07/2013 - not ammendable to PCI. Medical therapy 07/07/2013  . Abnormal nuclear cardiac imaging test - Intermediate Risk 07/06/2013  . Problem with Foley catheter (HCC) 07/04/2013  . Adynamic ileus (HCC) 07/03/2013  . Left leg DVT (HCC) 07/03/2013  . Acute respiratory failure (HCC) 07/02/2013  . NSTEMI (non-ST elevated myocardial infarction) (HCC) 07/02/2013  . 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 HTN (hypertension) 07/02/2013  . Bipolar disorder (manic depression) (HCC) 07/02/2013  . Dyslipidemia 07/02/2013  . Acute on chronic renal insufficiency (HCC) 07/02/2013  . Hypokalemia 07/02/2013  . Severe sepsis (HCC) 07/02/2013    Past Surgical History:  Procedure Laterality Date    . LEFT HEART CATHETERIZATION WITH CORONARY ANGIOGRAM N/A 07/06/2013   Procedure: LEFT HEART CATHETERIZATION WITH CORONARY ANGIOGRAM;  Surgeon: Marykay Lexavid W Harding, MD;  [facility];  Service: Cardiovascular;  Laterality: N/A;       Home Medications    Prior to Admission medications   Medication Sig Start Date End Date Taking? Authorizing Provider  albuterol (PROVENTIL) (5 MG/ML) 0.5% nebulizer solution Take 0.5 mLs (2.5 mg total) by nebulization every 3 (three) hours as needed for wheezing or shortness of breath. 07/10/13   Ripudeep Jenna LuoK Rai, MD  amoxicillin-clavulanate (AUGMENTIN) 500-125 MG per tablet Take 1 tablet (500 mg total) by mouth every 8 (eight) hours. X 4 more days 07/10/13   Ripudeep Jenna LuoK Rai, MD  aspirin 81 MG chewable tablet Chew 1 tablet (81 mg total) by mouth daily. 07/10/13   Ripudeep Jenna LuoK Rai, MD  famotidine (PEPCID) 20 MG tablet Take 1 tablet (20 mg total) by mouth 2 (two) times daily. 07/10/13   Ripudeep Jenna LuoK Rai, MD  furosemide (LASIX) 40 MG tablet Take 1 tablet (40 mg total) by mouth daily. 07/10/13   Ripudeep Jenna LuoK Rai, MD  metoprolol tartrate (LOPRESSOR) 25 MG tablet Take 1 tablet (25 mg total) by mouth 2 (two) times daily. 07/10/13   Ripudeep Jenna LuoK Rai, MD  potassium chloride SA (K-DUR,KLOR-CON) 20 MEQ tablet Take 2 tablets (40 mEq total) by mouth daily. 07/10/13   Ripudeep Jenna LuoK Rai, MD  Rivaroxaban (XARELTO) 15 MG TABS tablet Take 1 tablet (15 mg  total) by mouth 2 (two) times daily with a meal. Till 07/27/13, then start  daily 07/10/13 07/27/13  Ripudeep Jenna Luo, MD  Rivaroxaban (XARELTO) 20 MG TABS tablet Take 1 tablet (20 mg total) by mouth daily with supper. Start on 07/28/13, for total of 3 months 07/28/13   Ripudeep Jenna Luo, MD  simvastatin (ZOCOR) 10 MG tablet Take 10 mg by mouth at bedtime.    Historical Provider, MD    Family History No family history on file.  Social History Social History  Substance Use Topics  . Smoking status: Never Smoker  . Smokeless tobacco:  Not on file  . Alcohol use No     Allergies   Review of patient's allergies indicates no known allergies.   Review of Systems Review of Systems  Constitutional: Negative for fever.  HENT: Negative for sore throat.   Eyes: Negative for pain and visual disturbance.  Respiratory: Negative for shortness of breath.   Cardiovascular: Negative for chest pain.  Gastrointestinal: Negative for abdominal pain, nausea and vomiting.  Genitourinary: Negative for flank pain.  Musculoskeletal: Negative for back pain and neck pain.  Skin: Negative for rash.  Neurological: Negative for weakness, numbness and headaches.  Hematological: Does not bruise/bleed easily.  Psychiatric/Behavioral: Negative for confusion.     Physical Exam Updated Vital Signs BP 165/78 (BP Location: Right Arm)   Pulse 81   Temp 98.2 F (36.8 C) (Oral)   Resp 18   SpO2 100%   Physical Exam  Constitutional: He appears well-developed and well-nourished. No distress.  HENT:  Mouth/Throat: Oropharynx is clear and moist.  Very superficial, small abrasion left forehead.   Eyes: Conjunctivae are normal. Pupils are equal, round, and reactive to light.  Neck: Normal range of motion. Neck supple. No tracheal deviation present.  Cardiovascular: Normal rate, regular rhythm, normal heart sounds and intact distal pulses.   Pulmonary/Chest: Effort normal and breath sounds normal. No accessory muscle usage. No respiratory distress. He exhibits no tenderness.  Abdominal: Soft. Bowel sounds are normal. He exhibits no distension. There is no tenderness.  Musculoskeletal: Normal range of motion. He exhibits no edema or tenderness.  Neurological: He is alert.  Alert. Speech clear/fluent. Motor intact bil, str 5/5. sens grossly intact.   Skin: Skin is warm and dry. No rash noted. He is not diaphoretic.  No rash/shingles.   Psychiatric: He has a normal mood and affect.  Nursing note and vitals reviewed.    ED Treatments / Results   Labs (all labs ordered are listed, but only abnormal results are displayed) Labs Reviewed - No data to display  EKG  EKG Interpretation None       Radiology No results found.  Procedures Procedures (including critical care time)  Medications Ordered in ED Medications - No data to display   Initial Impression / Assessment and Plan / ED Course  I have reviewed the triage vital signs and the nursing notes.  Pertinent labs & imaging results that were available during my care of the patient were reviewed by me and considered in my medical decision making (see chart for details).  Clinical Course    Superficial abrasion to forehead, not through skin, no lac.   Patients mental status reported at baseline. No loc.   Patient denies any pain or other c/o, and currently appears stable for d/c.     Final Clinical Impressions(s) / ED Diagnoses   Final diagnoses:  None    New Prescriptions New Prescriptions   No  medications on file     Cathren Laine, MD 04/12/16 2056

## 2016-04-12 NOTE — Discharge Instructions (Signed)
It was our pleasure to provide your ER care today - we hope that you feel better.  Follow up with your primary care doctor in the coming week - have your blood pressure rechecked.   Return to ER if worse, new symptoms, new or severe pain, change in mental status, other concern.

## 2016-04-12 NOTE — ED Triage Notes (Signed)
ccollar removed by Dr Denton LankSteinl. Pt denies pain.

## 2016-04-12 NOTE — ED Triage Notes (Signed)
[facility] Staff called EMS because pt has new abrasion to forehead that was not there yesterday. Questionable fall. Pt does not recall fall. Pt has hx alzheimer's but answers appropriately to name, date and location. Pt in c-collar.

## 2018-10-25 ENCOUNTER — Emergency Department (HOSPITAL_COMMUNITY): Payer: Medicare Other

## 2018-10-25 ENCOUNTER — Encounter (HOSPITAL_COMMUNITY): Payer: Self-pay | Admitting: Emergency Medicine

## 2018-10-25 ENCOUNTER — Other Ambulatory Visit: Payer: Self-pay

## 2018-10-25 ENCOUNTER — Emergency Department (HOSPITAL_COMMUNITY)
Admission: EM | Admit: 2018-10-25 | Discharge: 2018-10-25 | Disposition: A | Discharge status: Home / Self Care | Payer: Medicare Other | Attending: Emergency Medicine | Admitting: Emergency Medicine

## 2018-10-25 DIAGNOSIS — Y9301 Activity, walking, marching and hiking: Secondary | ICD-10-CM | POA: Insufficient documentation

## 2018-10-25 DIAGNOSIS — I129 Hypertensive chronic kidney disease with stage 1 through stage 4 chronic kidney disease, or unspecified chronic kidney disease: Secondary | ICD-10-CM | POA: Diagnosis not present

## 2018-10-25 DIAGNOSIS — W010XXA Fall on same level from slipping, tripping and stumbling without subsequent striking against object, initial encounter: Secondary | ICD-10-CM | POA: Diagnosis not present

## 2018-10-25 DIAGNOSIS — F0390 Unspecified dementia without behavioral disturbance: Secondary | ICD-10-CM | POA: Diagnosis not present

## 2018-10-25 DIAGNOSIS — Z7982 Long term (current) use of aspirin: Secondary | ICD-10-CM | POA: Diagnosis not present

## 2018-10-25 DIAGNOSIS — Z79899 Other long term (current) drug therapy: Secondary | ICD-10-CM | POA: Insufficient documentation

## 2018-10-25 DIAGNOSIS — Z7902 Long term (current) use of antithrombotics/antiplatelets: Secondary | ICD-10-CM | POA: Insufficient documentation

## 2018-10-25 DIAGNOSIS — Y999 Unspecified external cause status: Secondary | ICD-10-CM | POA: Diagnosis not present

## 2018-10-25 DIAGNOSIS — N189 Chronic kidney disease, unspecified: Secondary | ICD-10-CM | POA: Insufficient documentation

## 2018-10-25 DIAGNOSIS — S0990XA Unspecified injury of head, initial encounter: Secondary | ICD-10-CM | POA: Insufficient documentation

## 2018-10-25 DIAGNOSIS — Y92129 Unspecified place in nursing home as the place of occurrence of the external cause: Secondary | ICD-10-CM | POA: Diagnosis not present

## 2018-10-25 DIAGNOSIS — Z7901 Long term (current) use of anticoagulants: Secondary | ICD-10-CM | POA: Insufficient documentation

## 2018-10-25 DIAGNOSIS — I2510 Atherosclerotic heart disease of native coronary artery without angina pectoris: Secondary | ICD-10-CM | POA: Diagnosis not present

## 2018-10-25 NOTE — ED Notes (Signed)
Per pt. He was carrying something and was walking to fast and fell.

## 2018-10-25 NOTE — ED Provider Notes (Signed)
[facility] Provider Note   CSN: [id number] Arrival date & time: 10/25/18  1926    History   Chief Complaint Chief Complaint  Patient presents with  . Fall    HPI Eric Zamora is a 73 y.o. male with a past medical history of CAD, currently on Plavix, dementia who presents to ED for mechanical fall that occurred prior to arrival.  States that he was carrying something at his facility and walking too fast.  He states that "I am just not can walk fast anymore."  He states that "I am not hurting anywhere, I feel great."  States that he did have a head injury but denies any loss of consciousness, headache, vision changes, vomiting, numbness in arms or legs, neck pain, back pain, chest pain.     HPI  Past Medical History:  Diagnosis Date  . Bipolar affective disorder (HCC)   . Chronic renal insufficiency   . Dementia (HCC)   . Dyslipidemia   . HTN (hypertension)   . Obstructive cardiomyopathy [facility](HCC) Jan 2010   46mmhg gradient on TEE    Patient Active Problem List   Diagnosis Date Noted  . Pneumonia 07/08/2013  . CAD- Moderate LAD disease with moderate to severe ostial first diagonal disease by cath 07/2013 - not ammendable to PCI. Medical therapy 07/07/2013  . Abnormal nuclear cardiac imaging test - Intermediate Risk 07/06/2013  . Problem with Foley catheter (HCC) 07/04/2013  . Adynamic ileus (HCC) 07/03/2013  . Left leg DVT (HCC) 07/03/2013  . Acute respiratory failure (HCC) 07/02/2013  . NSTEMI (non-ST elevated myocardial infarction) (HCC) 07/02/2013  . 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 HTN (hypertension) 07/02/2013  . Bipolar disorder (manic depression) (HCC) 07/02/2013  . Dyslipidemia 07/02/2013  . Acute on chronic renal insufficiency 07/02/2013  . Hypokalemia 07/02/2013  . Severe sepsis (HCC) 07/02/2013    Past Surgical History:  Procedure Laterality Date  . LEFT HEART CATHETERIZATION  WITH CORONARY ANGIOGRAM N/A 07/06/2013   Procedure: LEFT HEART CATHETERIZATION WITH CORONARY ANGIOGRAM;  Surgeon: Marykay Lexavid W Harding, MD;  [facility];  Service: Cardiovascular;  Laterality: N/A;        Home Medications    Prior to Admission medications   Medication Sig Start Date End Date Taking? Authorizing Provider  albuterol (PROVENTIL) (5 MG/ML) 0.5% nebulizer solution Take 0.5 mLs (2.5 mg total) by nebulization every 3 (three) hours as needed for wheezing or shortness of breath. Patient not taking: Reported on 04/12/2016 07/10/13   Rai, Delene Ruffiniipudeep K, MD  amoxicillin-clavulanate (AUGMENTIN) 500-125 MG per tablet Take 1 tablet (500 mg total) by mouth every 8 (eight) hours. X 4 more days Patient not taking: Reported on 04/12/2016 07/10/13   Cathren Harshai, Ripudeep K, MD  aspirin 81 MG chewable tablet Chew 1 tablet (81 mg total) by mouth daily. 07/10/13   Rai, Delene Ruffiniipudeep K, MD  clopidogrel (PLAVIX) 75 MG tablet Take 75 mg by mouth daily.    [provider]  famotidine (PEPCID) 20 MG tablet Take 1 tablet (20 mg total) by mouth 2 (two) times daily. Patient taking differently: Take 20 mg by mouth at bedtime.  07/10/13   Rai, Delene Ruffiniipudeep K, MD  furosemide (LASIX) 40 MG tablet Take 1 tablet (40 mg total) by mouth daily. 07/10/13   Rai, Delene Ruffiniipudeep K, MD  metoprolol tartrate (LOPRESSOR) 25 MG tablet Take 1 tablet (25 mg total) by mouth 2 (two) times daily. Patient not taking: Reported on 04/12/2016 07/10/13  Rai, Ripudeep K, MD  metoprolol tartrate (LOPRESSOR) 25 MG tablet Take 37.5 mg by mouth 2 (two) times daily.    [provider]  potassium chloride SA (K-DUR,KLOR-CON) 20 MEQ tablet Take 2 tablets (40 mEq total) by mouth daily. 07/10/13   Rai, Delene Ruffini, MD  Rivaroxaban (XARELTO) 15 MG TABS tablet Take 1 tablet (15 mg total) by mouth 2 (two) times daily with a meal. Till 07/27/13, then start  daily 07/10/13 07/27/13  Rai, Delene Ruffini, MD  Rivaroxaban (XARELTO) 20 MG TABS tablet Take 1  tablet (20 mg total) by mouth daily with supper. Start on 07/28/13, for total of 3 months Patient not taking: Reported on 04/12/2016 07/28/13   Rai, Delene Ruffini, MD  simvastatin (ZOCOR) 20 MG tablet Take 20 mg by mouth daily.    [provider]    Family History History reviewed. No pertinent family history.  Social History Social History   Tobacco Use  . Smoking status: Never Smoker  . Smokeless tobacco: Never Used  Substance Use Topics  . Alcohol use: No  . Drug use: Not on file     Allergies   Patient has no known allergies.   Review of Systems Review of Systems  Constitutional: Negative for appetite change, chills and fever.  HENT: Negative for ear pain, rhinorrhea, sneezing and sore throat.   Eyes: Negative for photophobia and visual disturbance.  Respiratory: Negative for cough, chest tightness, shortness of breath and wheezing.   Cardiovascular: Negative for chest pain and palpitations.  Gastrointestinal: Negative for abdominal pain, blood in stool, constipation, diarrhea, nausea and vomiting.  Genitourinary: Negative for dysuria, hematuria and urgency.  Musculoskeletal: Negative for myalgias.  Skin: Negative for rash.  Neurological: Negative for dizziness, weakness and light-headedness.     Physical Exam Updated Vital Signs BP 130/73   Pulse 84   Temp 98.8 F (37.1 C) (Oral)   Resp (!) 21   Ht  (1.753 m)   Wt 59 kg   SpO2 98%   BMI 19.20 kg/m   Physical Exam Vitals signs and nursing note reviewed.  Constitutional:      General: He is not in acute distress.    Appearance: He is well-developed.  HENT:     Head: Normocephalic and atraumatic.     Nose: Nose normal.  Eyes:     General: No scleral icterus.       Left eye: No discharge.     Conjunctiva/sclera: Conjunctivae normal.  Neck:     Musculoskeletal: Normal range of motion and neck supple.  Cardiovascular:     Rate and Rhythm: Normal rate and regular rhythm.     Heart sounds:  Normal heart sounds. No murmur. No friction rub. No gallop.   Pulmonary:     Effort: Pulmonary effort is normal. No respiratory distress.     Breath sounds: Normal breath sounds.  Abdominal:     General: Bowel sounds are normal. There is no distension.     Palpations: Abdomen is soft.     Tenderness: There is no abdominal tenderness. There is no guarding.  Musculoskeletal: Normal range of motion.  Skin:    General: Skin is warm and dry.     Findings: No rash.  Neurological:     General: No focal deficit present.     Mental Status: He is alert and oriented to person, place, and time.     Cranial Nerves: No cranial nerve deficit.     Sensory: No sensory deficit.  Motor: No weakness or abnormal muscle tone.     Coordination: Coordination normal.     Comments: Alert and oriented x4.  Pupils reactive. No facial asymmetry noted. Cranial nerves appear grossly intact. Sensation intact to light touch on face, BUE and BLE. Strength 5/5 in BUE and BLE.       ED Treatments / Results  Labs (all labs ordered are listed, but only abnormal results are displayed) Labs Reviewed - No data to display  EKG EKG Interpretation  Date/Time:  Tuesday October 25 2018 19:30:29 EST Ventricular Rate:  82 PR Interval:    QRS Duration: 89 QT Interval:  364 QTC Calculation: 426 R Axis:   64 Text Interpretation:  Sinus rhythm Prolonged PR interval Probable LVH with secondary repol abnrm No significant change since last tracing Confirmed by Gwyneth Sprout ([id number]) on 10/25/2018 7:37:13 PM   Radiology Ct Head Wo Contrast  Result Date: 10/25/2018 CLINICAL DATA:  Fall, hit head EXAM: CT HEAD WITHOUT CONTRAST TECHNIQUE: Contiguous axial images were obtained from the base of the skull through the vertex without intravenous contrast. 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 There is atrophy and chronic small vessel disease changes. No acute intracranial  abnormality. Specifically, no hemorrhage, hydrocephalus, mass lesion, acute infarction, or significant intracranial injury. Vascular: No hyperdense vessel or unexpected calcification. Skull: No acute calvarial abnormality. Sinuses/Orbits: Visualized paranasal sinuses and mastoids clear. Orbital soft tissues unremarkable. Other: None IMPRESSION: Atrophy, chronic microvascular disease. No acute intracranial abnormality. Electronically Signed   By: Charlett Nose M.D.   On: 10/25/2018 21:07    Procedures Procedures (including critical care time)  Medications Ordered in ED Medications - No data to display   Initial Impression / Assessment and Plan / ED Course  I have reviewed the triage vital signs and the nursing notes.  Pertinent labs & imaging results that were available during my care of the patient were reviewed by me and considered in my medical decision making (see chart for details).        73 year old male currently anticoagulated on Plavix presents to ED for mechanical fall that occurred prior to arrival.  States that he was carrying something when he fell because he was walking too fast.  He reports head injury but denies any loss of consciousness.  He denies any pain whatsoever.  He is alert and oriented x4.  No deficits on neurological exam noted.  No C, T or L-spine tenderness to palpation.  CT of the head is negative.  EKG shows no changes from prior tracings.  Patient requesting discharge home after removing his own IV.  Advised to return to ED for any severe worsening symptoms.  Patient is hemodynamically stable, in NAD, and able to ambulate in the ED. Evaluation does not show pathology that would require ongoing emergent intervention or inpatient treatment. I explained the diagnosis to the patient. Pain has been managed and has no complaints prior to discharge. Patient is comfortable with above plan and is stable for discharge at this time. All questions were answered prior to  disposition. Strict return precautions for returning to the ED were discussed. Encouraged follow up with PCP.    Portions of this note were generated with Scientist, clinical (histocompatibility and immunogenetics). Dictation errors may occur despite best attempts at proofreading.   Final Clinical Impressions(s) / ED Diagnoses   Final diagnoses:  Injury of head, initial encounter    ED Discharge Orders    None  Dietrich Pates, PA-C 10/25/18 2140    Gwyneth Sprout, MD 10/26/18 2113

## 2018-10-25 NOTE — ED Triage Notes (Signed)
BIB [facility] from [facility] with c/o of fall. Pt hit head and is taking Plavix. Hx of alzheimer's. No c/o pain on arrival.

## 2018-10-25 NOTE — Discharge Instructions (Addendum)
Continue home medications as previously prescribed. Return to ED for worsening symptoms, headache, blurry vision, numbness in arms or legs.

## 2018-10-25 NOTE — ED Notes (Signed)
Care handoff given to [facility] at [facility]

## 2018-12-30 ENCOUNTER — Encounter (HOSPITAL_COMMUNITY): Payer: Self-pay

## 2018-12-30 ENCOUNTER — Emergency Department (HOSPITAL_COMMUNITY): Payer: Medicare Other

## 2018-12-30 ENCOUNTER — Emergency Department (HOSPITAL_COMMUNITY)
Admission: EM | Admit: 2018-12-30 | Discharge: 2018-12-31 | Disposition: A | Discharge status: Home / Self Care | Payer: Medicare Other | Attending: Emergency Medicine | Admitting: Emergency Medicine

## 2018-12-30 ENCOUNTER — Other Ambulatory Visit: Payer: Self-pay

## 2018-12-30 DIAGNOSIS — E785 Hyperlipidemia, unspecified: Secondary | ICD-10-CM | POA: Diagnosis not present

## 2018-12-30 DIAGNOSIS — W19XXXA Unspecified fall, initial encounter: Secondary | ICD-10-CM | POA: Diagnosis not present

## 2018-12-30 DIAGNOSIS — F0390 Unspecified dementia without behavioral disturbance: Secondary | ICD-10-CM | POA: Insufficient documentation

## 2018-12-30 DIAGNOSIS — I252 Old myocardial infarction: Secondary | ICD-10-CM | POA: Insufficient documentation

## 2018-12-30 DIAGNOSIS — Y939 Activity, unspecified: Secondary | ICD-10-CM | POA: Diagnosis not present

## 2018-12-30 DIAGNOSIS — Y999 Unspecified external cause status: Secondary | ICD-10-CM | POA: Diagnosis not present

## 2018-12-30 DIAGNOSIS — Z7901 Long term (current) use of anticoagulants: Secondary | ICD-10-CM | POA: Diagnosis not present

## 2018-12-30 DIAGNOSIS — I2510 Atherosclerotic heart disease of native coronary artery without angina pectoris: Secondary | ICD-10-CM | POA: Insufficient documentation

## 2018-12-30 DIAGNOSIS — N189 Chronic kidney disease, unspecified: Secondary | ICD-10-CM | POA: Diagnosis not present

## 2018-12-30 DIAGNOSIS — Y92199 Unspecified place in other specified residential institution as the place of occurrence of the external cause: Secondary | ICD-10-CM | POA: Insufficient documentation

## 2018-12-30 DIAGNOSIS — S12000A Unspecified displaced fracture of first cervical vertebra, initial encounter for closed fracture: Secondary | ICD-10-CM | POA: Diagnosis not present

## 2018-12-30 DIAGNOSIS — R7989 Other specified abnormal findings of blood chemistry: Secondary | ICD-10-CM | POA: Insufficient documentation

## 2018-12-30 DIAGNOSIS — I129 Hypertensive chronic kidney disease with stage 1 through stage 4 chronic kidney disease, or unspecified chronic kidney disease: Secondary | ICD-10-CM | POA: Insufficient documentation

## 2018-12-30 DIAGNOSIS — Z7982 Long term (current) use of aspirin: Secondary | ICD-10-CM | POA: Insufficient documentation

## 2018-12-30 DIAGNOSIS — Z03818 Encounter for observation for suspected exposure to other biological agents ruled out: Secondary | ICD-10-CM | POA: Insufficient documentation

## 2018-12-30 DIAGNOSIS — Z7902 Long term (current) use of antithrombotics/antiplatelets: Secondary | ICD-10-CM | POA: Insufficient documentation

## 2018-12-30 DIAGNOSIS — S12040A Displaced lateral mass fracture of first cervical vertebra, initial encounter for closed fracture: Secondary | ICD-10-CM

## 2018-12-30 DIAGNOSIS — Z79899 Other long term (current) drug therapy: Secondary | ICD-10-CM | POA: Diagnosis not present

## 2018-12-30 DIAGNOSIS — S0990XA Unspecified injury of head, initial encounter: Secondary | ICD-10-CM | POA: Diagnosis present

## 2018-12-30 LAB — BASIC METABOLIC PANEL WITH GFR
Anion gap: 7 (ref 5–15)
BUN: 24 mg/dL — ABNORMAL HIGH (ref 8–23)
CO2: 25 mmol/L (ref 22–32)
Calcium: 8.3 mg/dL — ABNORMAL LOW (ref 8.9–10.3)
Chloride: 107 mmol/L (ref 98–111)
Creatinine, Ser: 1.76 mg/dL — ABNORMAL HIGH (ref 0.61–1.24)
GFR calc Af Amer: 43 mL/min — ABNORMAL LOW
GFR calc non Af Amer: 37 mL/min — ABNORMAL LOW
Glucose, Bld: 161 mg/dL — ABNORMAL HIGH (ref 70–99)
Potassium: 4.6 mmol/L (ref 3.5–5.1)
Sodium: 139 mmol/L (ref 135–145)

## 2018-12-30 LAB — CBC WITH DIFFERENTIAL/PLATELET
Abs Immature Granulocytes: 0.05 K/uL (ref 0.00–0.07)
Basophils Absolute: 0.1 K/uL (ref 0.0–0.1)
Basophils Relative: 1 %
Eosinophils Absolute: 0.2 K/uL (ref 0.0–0.5)
Eosinophils Relative: 4 %
HCT: 36.6 % — ABNORMAL LOW (ref 39.0–52.0)
Hemoglobin: 11.9 g/dL — ABNORMAL LOW (ref 13.0–17.0)
Immature Granulocytes: 1 %
Lymphocytes Relative: 24 %
Lymphs Abs: 1.4 K/uL (ref 0.7–4.0)
MCH: 29.7 pg (ref 26.0–34.0)
MCHC: 32.5 g/dL (ref 30.0–36.0)
MCV: 91.3 fL (ref 80.0–100.0)
Monocytes Absolute: 0.8 K/uL (ref 0.1–1.0)
Monocytes Relative: 15 %
Neutro Abs: 3.2 K/uL (ref 1.7–7.7)
Neutrophils Relative %: 55 %
Platelets: 169 K/uL (ref 150–400)
RBC: 4.01 MIL/uL — ABNORMAL LOW (ref 4.22–5.81)
RDW: 12.8 % (ref 11.5–15.5)
WBC: 5.7 K/uL (ref 4.0–10.5)
nRBC: 0 % (ref 0.0–0.2)

## 2018-12-30 NOTE — ED Provider Notes (Addendum)
"Eric Zamora is Zamora 73 y.o. male with h/o dementia, HTN, HOCM, CAD s/p STEMI, DVT, on clopidogrel presents to ED after witnessed mechanical fall at living facility with facial trauma and brief LOC approximately 1 minute.  Patient is oriented to self, place, year and event. States he stood up to walk and got dizzy and fell.  He denies any pain anywhere. Denies headache, vision changes, chest pain, unilateral weakness or loss of sensation, back pain or other injuries in extremities. Denies prodromal chest pain, palpitation, shortness of breath. No recent vomiting, diarrhea, black stools. No interventions. No alleviating/aggravating factors."   Spoke with Eric Zamora supervisor.  She reports they have attempted to call patient's emergency contact several times without success. [facility].  Reports the oncoming supervisor would like to speak with me for further information.   Spoke with Eric Zamora, oncoming Eric Zamora, she has provided me with the phone number on file for the patient's emergency contact Eric Zamora, sister.  This is been added to the patient's chart.  On my evaluation, the patient is oriented to person.  He has no complaints at this time.  Eric Zamora collar is in place.   Physical Exam  BP (!) 155/74   Pulse 79   Temp 98.6 F (37 C) (Oral)   Resp 15   Ht 5\' 6"  (1.676 m)   Wt 83 kg   SpO2 96%   BMI 29.54 kg/m   Physical Exam Vitals signs and nursing note reviewed.  Constitutional:      Appearance: He is well-developed.     Comments: Eric Zamora collar is in place.  HENT:     Head: Normocephalic.  Eyes:     Conjunctiva/sclera: Conjunctivae normal.  Neck:     Musculoskeletal: Neck supple.  Cardiovascular:     Rate and Rhythm: Normal rate and regular rhythm.     Heart sounds: No murmur.  Pulmonary:     Effort: Pulmonary effort is normal.  Abdominal:     General: There is no distension.     Palpations: Abdomen is  soft.  Musculoskeletal:        General: No swelling, tenderness, deformity or signs of injury.     Right lower leg: No edema.     Left lower leg: No edema.  Skin:    General: Skin is warm and dry.  Neurological:     Mental Status: He is alert.     Comments: Oriented to person.  Moves all 4 extremities.  Strength is intact all 4 extremities.  Psychiatric:        Behavior: Behavior normal.     ED Course/Procedures   Clinical Course as of Dec 30 433  Fri Dec 30, 2018  1946 IMPRESSION: Generalized atrophy.  No acute intracranial abnormalities.  Fracture through LEFT lateral mass of C1, displaced with question severe subluxation versus frank dislocation of LEFT C1-C2 articulation, incompletely assessed by CT head exam.  Dedicated CT cervical spine recommended.  Critical Value/emergent results were called by telephone at the time of interpretation on 12/30/2018 at 7:34 pm to Dr. Sharen Heck , who verbally acknowledged these results.   [CG]  1946 [place] collar applied to patient with RN assistance   [CG]  1955 Paged nsgy on amion    [CG]  2056 Spoke to [place] with neurosurgery who recommends putting patient in Eric Zamora collar, will call back after discussing with physician on-call.   [CG]  2115 Unknown baseline, last  many years ago   Hemoglobin(!): 11.9 [CG]  2117 Unknown baseline, last many years ago   Creatinine(!): 1.76 [CG]  2147 Attempted to call family member but wrong number on chart    [CG]  2312 RN reports patient was awake and answering questions earlier, but has become somewhat more confused into the evening, consistent with sundowning.  Has attempted to remove Eric Zamora collar 3 times. Spoke with Eric Zamora with neurosurgery.  Given patient's medical comorbidities, he is Zamora poor candidate for surgical intervention.  Recommended conservative management with Eric Zamora collar.  Attempted to call the patient's emergency contact, his sister, with no answer.  Spoke with supervisor  Eric [facility] at [facility] regarding the patient's living arrangements.  He is in assisted living.  Eric Zamora is requesting contact information for oncoming supervisor to give me Zamora return call.  Awaiting callback from oncoming supervisor.   [MM]    Clinical Course User Index [CG] Eric Zamora, Eric Scaduto Zamora, Eric-C [MM] Eric Zamora, Eric Zamora, Eric-C    Procedures  MDM  73 year old male received Zam[place] signout from [place] pending MRI of the cervical spine. Medical history includes dementia, HTN, HOCM, CAD s/p STEMI, DVT, on clopidogrel presents to ED after witnessed mechanical fall at living facility with facial trauma and brief LOC.  Eric Zamora has attempted to contact patient's family without success.  She notes the wrong number is listed under the emergency contact in the chart.  She has spoken with neurosurgery Eric after CT cervical spine demonstrated Zamora C1 fracture. Eric Zamora recommended MRI cervical spine and indicated the patient will likely require admission if there is Zamora ligamentous injury and request neurosurgery call back if this is present on MRI.  MRI with left lateral mass C1 fracture with posterior subluxation of C1 facet relative to C2 and likely tears of the anterior atlantooccipital ligament and apical ligament. Tectorial membrane, posterior longitudinal ligament, ligamentum flavum and the sub C1 anterior longitudinal ligament are intact.  Normal cervical spinal cord signal.  Spoke with Eric Zamora with neurosurgery and the patient's baseline status and medical comorbidities were discussed.  Eric Zamora feels that he is Zamora poor surgical candidate and recommends conservative management with aspirin collar and follow-up in the outpatient neurosurgery clinic in approximately 1 week.  [facility] as mentioned above.  We discussed the patient's care needs until he is cleared by neurosurgery.  Staff did not express any concerns with the care plan.  They have provided me with an alternate contact  number for the patient's emergency contact.  I have attempted to reach the patient's sister multiple times without success.  No other emergency contact information is listed.   He has required no pain control since arrival in the ED.  Had discussed with RN to ambulate the patient prior to discharge, but was informed after [facility] arrived that patient had not ambulated prior to transfer. [facility] neurosurgery follow-up.  Return precautions to the ER given.    Eric Zamora, Eric Zamora, Eric-C 12/31/18 0435    Dione BoozeGlick, David, MD 12/31/18 0725    Eric Zamora, Grabiel Schmutz Zamora, Eric-C 12/31/18 Cassell [id number]    Glick, David, MD 12/31/18 2308

## 2018-12-30 NOTE — ED Notes (Signed)
Bed: WA19 Expected date:  Expected time:  Means of arrival:  Comments: EMS-fall 

## 2018-12-30 NOTE — Discharge Instructions (Signed)
Thank you for allowing me to care for you today in the Emergency Department.   Your imaging showed a fracture of your C1 vertebra in your neck.  The treatment for this is to wear the Aspen neck collar that you were given in the ER 24 hours a day until you are cleared by neurosurgery.  It is very important you do not remove this collar until cleared by neurosurgery.   For pain, you can take 650 mg of Tylenol once every 6 hours.  You can also apply a lidocaine patch, which is available over-the-counter, to areas that are sore.  Please call Dr. Lonie Peak office on Monday morning.  He would like to see you in the office in about a week, preferably on Thursday, May 7.  Your creatinine was elevated today.  You have not had blood work in more than 6 years.  I would recommend having blood work redrawn within the next month to see if this is improved.  Make sure that you are drinking plenty of fluids to avoid dehydration.  Return to the emergency department if you have any fall or injury, pain, unable to move your arms or legs, develop new numbness or weakness, or other new, concerning symptoms.

## 2018-12-30 NOTE — ED Triage Notes (Signed)
Per EMS:  Pt from [facility].  Staff stated he was walking, tripped, and fell and hit his face and lost consciousness for one minute.  Pt alert and oriented x4.  Pt states he felt dizzy before he tripped and fell.  Pt denied LOC, and stated he felt better when he got up.

## 2018-12-30 NOTE — ED Provider Notes (Addendum)
[facility]-EMERGENCY DEPT Provider Note   CSN: [id number] Arrival date & time: 12/30/18  1711    History   Chief Complaint No chief complaint on file.   HPI Eric Zamora is a 73 y.o. male with h/o dementia, HTN, HOCM, CAD s/p STEMI, DVT, on clopidogrel presents to ED after witnessed mechanical fall at living facility with facial trauma and brief LOC approximately 1 minute.  Patient is oriented to self, place, year and event. States he stood up to walk and got dizzy and fell.  He denies any pain anywhere. Denies headache, vision changes, chest pain, unilateral weakness or loss of sensation, back pain or other injuries in extremities. Denies prodromal chest pain, palpitation, shortness of breath. No recent vomiting, diarrhea, black stools. No interventions. No alleviating/aggravating factors.      HPI  Past Medical History:  Diagnosis Date   Bipolar affective disorder (HCC)    Chronic renal insufficiency    Dementia (HCC)    Dyslipidemia    HTN (hypertension)    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   gradient on TEE    Patient Active Problem List   Diagnosis Date Noted   Pneumonia 07/08/2013   CAD- Moderate LAD disease with moderate to severe ostial first diagonal disease by cath 07/2013 - not ammendable to PCI. 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 07/02/2013   HTN (hypertension) 07/02/2013   Bipolar disorder (manic depression) (HCC) 07/02/2013   Dyslipidemia 07/02/2013   Acute on chronic renal insufficiency 07/02/2013   Hypokalemia 07/02/2013   Severe sepsis (HCC) 07/02/2013     Past Surgical History:  Procedure Laterality Date   LEFT HEART CATHETERIZATION WITH CORONARY ANGIOGRAM N/A 07/06/2013   Procedure: LEFT HEART CATHETERIZATION WITH CORONARY ANGIOGRAM;  Surgeon: Marykay Lex, MD;  Location: [facility] CATH LAB;  Service: Cardiovascular;  Laterality: N/A;        Home Medications    Prior to Admission medications   Medication Sig Start Date End Date Taking? Authorizing Provider  albuterol (PROVENTIL) (5 MG/ML) 0.5% nebulizer solution Take 0.5 mLs (2.5 mg total) by nebulization every 3 (three) hours as needed for wheezing or shortness of breath. Patient not taking: Reported on 04/12/2016 07/10/13   Rai, Delene Ruffini, MD  amoxicillin-clavulanate (AUGMENTIN) 500-125 MG per tablet Take 1 tablet (500 mg total) by mouth every 8 (eight) hours. X 4 more days Patient not taking: Reported on 04/12/2016 07/10/13   Cathren Harsh, MD  aspirin 81 MG chewable tablet Chew 1 tablet (81 mg total) by mouth daily. 07/10/13   Rai, Delene Ruffini, MD  clopidogrel (PLAVIX) 75 MG tablet Take 75 mg by mouth daily.    [provider]  famotidine (PEPCID) 20 MG tablet Take 1 tablet (20 mg total) by mouth 2 (two) times daily. Patient taking differently: Take 20 mg by mouth at bedtime.  07/10/13   Rai, Delene Ruffini, MD  furosemide (LASIX) 40 MG tablet Take 1 tablet (40 mg total) by mouth daily. 07/10/13   Rai, Delene Ruffini, MD  metoprolol tartrate (LOPRESSOR) 25 MG tablet Take 1 tablet (25 mg total) by mouth 2 (two) times daily. Patient not taking: Reported on 04/12/2016 07/10/13  Rai, Ripudeep K, MD  metoprolol tartrate (LOPRESSOR) 25 MG tablet Take 37.5 mg by mouth 2 (two) times daily.    [provider]  PARoxetine (PAXIL) 20 MG tablet Take 20 mg by mouth daily. 11/28/18   [provider]  potassium chloride SA (K-DUR,KLOR-CON) 20 MEQ tablet Take 2 tablets (40 mEq total) by mouth daily. 07/10/13   Rai, Delene Ruffiniipudeep K, MD  Rivaroxaban (XARELTO) 15 MG TABS tablet Take 1  tablet (15 mg total) by mouth 2 (two) times daily with a meal. Till 07/27/13, then start 20mg  daily 07/10/13 07/27/13  Rai, Delene Ruffiniipudeep K, MD  Rivaroxaban (XARELTO) 20 MG TABS tablet Take 1 tablet (20 mg total) by mouth daily with supper. Start on 07/28/13, for total of 3 months Patient not taking: Reported on 04/12/2016 07/28/13   Rai, Delene Ruffiniipudeep K, MD  simvastatin (ZOCOR) 20 MG tablet Take 20 mg by mouth daily.    [provider]    Family History No family history on file.  Social History Social History   Tobacco Use   Smoking status: Never Smoker   Smokeless tobacco: Never Used  Substance Use Topics   Alcohol use: No   Drug use: Not on file     Allergies   Patient has no known allergies.   Review of Systems Review of Systems  Neurological:       LOC  Hematological: Bruises/bleeds easily.  All other systems reviewed and are negative.    Physical Exam Updated Vital Signs BP (!) 153/78    Pulse 83    Temp 98.7 F (37.1 C) (Oral)    Resp 16    Ht 5\' 6"  (1.676 m)    Wt 83 kg    SpO2 93%    BMI 29.54 kg/m   Physical Exam Vitals signs and nursing note reviewed.  Constitutional:      General: He is not in acute distress.    Appearance: He is well-developed.     Comments: NAD.  HENT:     Head: Normocephalic. Abrasion present.     Comments: Very subtle abrasion to top of forehead but non tender.    Right Ear: External ear normal.     Left Ear: External ear normal. There is impacted cerumen.     Ears:     Comments: Left cerumen impaction unable to visualize TM.  Right TM without hemotympanum.  No battle signs.    Nose: Nose normal.     Mouth/Throat:     Comments: Edentulous. No intraoral or tongue injury. MMM Eyes:     General: No scleral icterus.    Conjunctiva/sclera: Conjunctivae normal.     Comments: Bilateral subtle symmetric ptosis   Neck:     Musculoskeletal: Normal range of motion and neck supple.     Comments: C-spine: No cervical collar in place  upon arrival.  No midline or paraspinal muscle tenderness. Cardiovascular:     Rate and Rhythm: Normal rate and regular rhythm.     Heart sounds: Normal heart sounds. No murmur.     Comments: 1+ radial and DP pulses bilaterally.  No lower extremity edema. Pulmonary:     Effort: Pulmonary effort is normal.     Breath sounds: Normal breath sounds. No wheezing.  Musculoskeletal: Normal range of motion.        General: No deformity.  Skin:    General: Skin is warm and dry.     Capillary Refill: Capillary refill takes less than 2 seconds.  Neurological:  Mental Status: He is alert and oriented to person, place, and time.     Comments: Alert and oriented to self, place, time and event.  Speech is fluent without dysarthria or dysphasia. Strength 5/5 with hand grip and ankle F/E.   Sensation to light touch intact in face, hands and feet. No pronator drift. No leg drop. Normal finger-to-nose.  CN I not tested CN II grossly intact visual fields bilaterally. Unable to visualize posterior eye. CN III, IV, VI PEERL and EOMs intact bilaterally CN V light touch intact in all 3 divisions of trigeminal nerve CN VII facial movements symmetric CN VIII not tested CN IX, X no uvula deviation, symmetric rise of soft palate  CN XI 5/5 SCM and trapezius strength bilaterally  CN XII Midline tongue protrusion, symmetric L/R movements  Psychiatric:        Behavior: Behavior normal.        Thought Content: Thought content normal.        Judgment: Judgment normal.      ED Treatments / Results  Labs (all labs ordered are listed, but only abnormal results are displayed) Labs Reviewed  CBC WITH DIFFERENTIAL/PLATELET - Abnormal; Notable for the following components:      Result Value   RBC 4.01 (*)    Hemoglobin 11.9 (*)    HCT 36.6 (*)    All other components within normal limits  BASIC METABOLIC PANEL - Abnormal; Notable for the following components:   Glucose, Bld 161 (*)    BUN 24 (*)     Creatinine, Ser 1.76 (*)    Calcium 8.3 (*)    GFR calc non Af Amer 37 (*)    GFR calc Af Amer 43 (*)    All other components within normal limits    EKG EKG Interpretation  Date/Time:  Friday Dec 30 2018 18:22:02 EDT Ventricular Rate:  78 PR Interval:    QRS Duration: 94 QT Interval:  390 QTC Calculation: 445 R Axis:   66 Text Interpretation:  Sinus rhythm Prolonged PR interval Probable LVH with secondary repol abnrm Abnormal T, probable ischemia, anterior leads no significant change since Feb 2020 Confirmed by Pricilla Loveless [phone]) on 12/30/2018 9:18:28 PM   Radiology Ct Head Wo Contrast  Result Date: 12/30/2018 CLINICAL DATA:  Tripped and fell while walking striking face, loss of consciousness for 1 minutes, now alert and oriented x4, states he felt dizzy prior to tripping and falling, history of dementia, hypertension, obstructive cardiomyopathy EXAM: CT HEAD WITHOUT CONTRAST TECHNIQUE: Contiguous axial images were obtained from the base of the skull through the vertex without intravenous contrast. Sagittal and coronal MPR images reconstructed from axial data set. COMPARISON:  10/25/2018 FINDINGS: Brain: Generalized atrophy. Normal ventricular morphology. No midline shift or mass effect. No intracranial hemorrhage, mass lesion, or evidence of acute infarction. No extra-axial fluid collections. Vascular: Atherosclerotic calcifications of internal carotid arteries at skull base Skull: Skull intact. Sinuses/Orbits: Clear Other: Fracture through the LEFT lateral mass of C1,. Significant subluxation versus dislocation of LEFT C1-C2 articulation. Absent RIGHT posterior elements C1. Abnormalities of C1-C2 are incompletely assessed by this exam. IMPRESSION: Generalized atrophy. No acute intracranial abnormalities. Fracture through LEFT lateral mass of C1, displaced with question severe subluxation versus frank dislocation of LEFT C1-C2 articulation, incompletely assessed by CT head exam. Dedicated  CT cervical spine recommended. Critical Value/emergent results were called by telephone at the time of interpretation on 12/30/2018 at 7:34 pm to Dr. Sharen Heck , who verbally acknowledged these results.  Electronically Signed   By: Ulyses Southward M.D.   On: 12/30/2018 19:36   Ct Cervical Spine Wo Contrast  Result Date: 12/30/2018 CLINICAL DATA:  Fall, head and neck trauma. EXAM: CT CERVICAL SPINE WITHOUT CONTRAST TECHNIQUE: Multidetector CT imaging of the cervical spine was performed without intravenous contrast. Multiplanar CT image reconstructions were also generated. COMPARISON:  Head CT earlier today. FINDINGS: Alignment: Normal alignment across the disc spaces. Skull base and vertebrae: There is a fracture through the left C1 lateral mass which is displaced. The left C1 facet is displaced/dislocated posteriorly relative to the left C2 mass, best seen on sagittal imaging. Absence of the right side of the C1 posterior arch could be congenital or postoperative. Nondisplaced fracture through the CT spinous process no additional cervical spine fracture. Soft tissues and spinal canal: Soft tissue swelling anterior to the C1-2 level. Remainder the prevertebral soft tissues are normal. Disc levels:  Disc spaces maintained. Upper chest: Negative Other: Carotid artery calcifications. IMPRESSION: Fracture through the left lateral mass at C1. The left C1 facet is displaced/dislocated posteriorly relative to the left C2 lateral mass/facet. Absence of the right posterior C1 arch. Nondisplaced fracture through the spinous process at C3. Electronically Signed   By: Charlett Nose M.D.   On: 12/30/2018 20:13    Procedures .Critical Care Performed by: Liberty Handy, PA-C Authorized by: Liberty Handy, PA-C   Critical care provider statement:    Critical care time (minutes):  45   Critical care was necessary to treat or prevent imminent or life-threatening deterioration of the following conditions:  Trauma    Critical care was time spent personally by me on the following activities:  Discussions with consultants, evaluation of patient's response to treatment, examination of patient, ordering and performing treatments and interventions, ordering and review of laboratory studies, ordering and review of radiographic studies, pulse oximetry, re-evaluation of patient's condition, obtaining history from patient or surrogate and review of old charts   I assumed direction of critical care for this patient from another provider in my specialty: no     (including critical care time)  Medications Ordered in ED Medications - No data to display   Initial Impression / Assessment and Plan / ED Course  I have reviewed the triage vital signs and the nursing notes.  Pertinent labs & imaging results that were available during my care of the patient were reviewed by me and considered in my medical decision making (see chart for details).  Clinical Course as of Dec 30 2150  Fri Dec 30, 2018  1946 IMPRESSION: Generalized atrophy.  No acute intracranial abnormalities.  Fracture through LEFT lateral mass of C1, displaced with question severe subluxation versus frank dislocation of LEFT C1-C2 articulation, incompletely assessed by CT head exam.  Dedicated CT cervical spine recommended.  Critical Value/emergent results were called by telephone at the time of interpretation on 12/30/2018 at 7:34 pm to Dr. Sharen Heck , who verbally acknowledged these results.   [CG]  1946 [place] collar applied to patient with RN assistance   [CG]  1955 Paged nsgy on amion    [CG]  2056 Spoke to [place] with neurosurgery who recommends putting patient in [place] collar, will call back after discussing with physician on-call.   [CG]  2115 Unknown baseline, last many years ago   Hemoglobin(!): 11.9 [CG]  2117 Unknown baseline, last many years ago   Creatinine(!): 1.76 [CG]  2147 Attempted to call family member but wrong number on  chart    [  CG]    Clinical Course User Index [CG] Liberty Handy, PA-C       Given prodromal lightheadedness, fall will get screening labs.  HD stable.  Exam concerning for head trauma with abrasion on his forehead, in setting of clopidogrel, will get head CT.  No other obvious signs of CT L-spine, chest, abdominal, pelvis or other extremity injury.  2120: CT shows fracture at C1 with displacement.  Spoke to [employer] with neurosurgery, they recommend MRI to evaluate for ligamentous injury.  Pt reassessed again and no neuro deficits. MRI Pending. Labs unremarkable. EKG w/o changes. Has been on cardiac monitor without events. Unclear cause of transient light-headedness, negative orthostatics.  Final Clinical Impressions(s) / ED Diagnoses   2150: Patient will be handed off to oncoming ED PA at shift change who will follow-up on MRI.  Per neurosurgery recommendations, if there is ligamentous injury patient is to be admitted and neurosurgery to be re-paged.  If there is no ligamentous injury and patient is able to ambulate, he can be discharged.  He is to call neurosurgery on Monday to follow-up in the office on Thursday.  Shared with EDMD.  Final diagnoses:  Closed displaced lateral mass fracture of first cervical vertebra, initial encounter [facility])  Fall, initial encounter    ED Discharge Orders    None        Jerrell Mylar 12/30/18 2152    Pricilla Loveless, MD 12/30/18 2249

## 2018-12-31 LAB — SARS CORONAVIRUS 2 BY RT PCR (HOSPITAL ORDER, PERFORMED IN ~~LOC~~ HOSPITAL LAB): SARS Coronavirus 2: NEGATIVE

## 2018-12-31 NOTE — ED Notes (Signed)
Called facility and spoke w crystal care manager

## 2019-04-13 ENCOUNTER — Ambulatory Visit
Admission: RE | Admit: 2019-04-13 | Discharge: 2019-04-13 | Disposition: A | Discharge status: Home / Self Care | Payer: Medicare Other | Source: Ambulatory Visit | Attending: Neurosurgery | Admitting: Neurosurgery

## 2019-04-13 ENCOUNTER — Other Ambulatory Visit: Payer: Self-pay | Admitting: Neurosurgery

## 2019-04-13 ENCOUNTER — Other Ambulatory Visit: Payer: Self-pay

## 2019-04-13 DIAGNOSIS — S12040A Displaced lateral mass fracture of first cervical vertebra, initial encounter for closed fracture: Secondary | ICD-10-CM

## 2019-05-03 ENCOUNTER — Other Ambulatory Visit: Payer: Self-pay | Admitting: Physician Assistant

## 2019-11-30 ENCOUNTER — Encounter (HOSPITAL_COMMUNITY): Payer: Self-pay

## 2019-11-30 ENCOUNTER — Emergency Department (HOSPITAL_COMMUNITY): Payer: Medicare Other

## 2019-11-30 ENCOUNTER — Other Ambulatory Visit: Payer: Self-pay

## 2019-11-30 ENCOUNTER — Emergency Department (HOSPITAL_COMMUNITY)
Admission: EM | Admit: 2019-11-30 | Discharge: 2019-11-30 | Disposition: A | Discharge status: Skilled Nursing Facility | Payer: Medicare Other | Attending: Emergency Medicine | Admitting: Emergency Medicine

## 2019-11-30 DIAGNOSIS — S0990XA Unspecified injury of head, initial encounter: Secondary | ICD-10-CM | POA: Diagnosis not present

## 2019-11-30 DIAGNOSIS — I2510 Atherosclerotic heart disease of native coronary artery without angina pectoris: Secondary | ICD-10-CM | POA: Diagnosis not present

## 2019-11-30 DIAGNOSIS — Z043 Encounter for examination and observation following other accident: Secondary | ICD-10-CM | POA: Insufficient documentation

## 2019-11-30 DIAGNOSIS — W19XXXA Unspecified fall, initial encounter: Secondary | ICD-10-CM

## 2019-11-30 DIAGNOSIS — Z79899 Other long term (current) drug therapy: Secondary | ICD-10-CM | POA: Diagnosis not present

## 2019-11-30 DIAGNOSIS — F0390 Unspecified dementia without behavioral disturbance: Secondary | ICD-10-CM | POA: Insufficient documentation

## 2019-11-30 DIAGNOSIS — I119 Hypertensive heart disease without heart failure: Secondary | ICD-10-CM | POA: Diagnosis not present

## 2019-11-30 DIAGNOSIS — Z7902 Long term (current) use of antithrombotics/antiplatelets: Secondary | ICD-10-CM | POA: Insufficient documentation

## 2019-11-30 DIAGNOSIS — Y9341 Activity, dancing: Secondary | ICD-10-CM | POA: Diagnosis not present

## 2019-11-30 DIAGNOSIS — W010XXA Fall on same level from slipping, tripping and stumbling without subsequent striking against object, initial encounter: Secondary | ICD-10-CM | POA: Insufficient documentation

## 2019-11-30 DIAGNOSIS — Y999 Unspecified external cause status: Secondary | ICD-10-CM | POA: Diagnosis not present

## 2019-11-30 DIAGNOSIS — Y92129 Unspecified place in nursing home as the place of occurrence of the external cause: Secondary | ICD-10-CM | POA: Insufficient documentation

## 2019-11-30 NOTE — ED Notes (Signed)
[facility] called for transport.   [facility] called spoke with L[place] and report was given.

## 2019-11-30 NOTE — ED Notes (Signed)
[facility] called for transport.  

## 2019-11-30 NOTE — ED Provider Notes (Signed)
[facility] DEPT Provider Note   CSN: [id number] Arrival date & time: 11/30/19  1617     History Chief Complaint  Patient presents with  . Eric Zamora is a 74 y.o. male.  74 yo M with a cc of a fall.  This happened during a dance class that he was in.  He denies any injury in the fall.  Did strike his head on the ground.  Was sent by his nursing home for evaluation.  The history is provided by the patient.  Fall This is a new problem. The current episode started 1 to 2 hours ago. The problem occurs rarely. The problem has been resolved. Pertinent negatives include no chest pain, no abdominal pain, no headaches and no shortness of breath. Nothing aggravates the symptoms. Nothing relieves the symptoms. He has tried nothing for the symptoms. The treatment provided no relief.       Past Medical History:  Diagnosis Date  . Bipolar affective disorder ([place])   . Chronic renal insufficiency   . Dementia ([place])   . Dyslipidemia   . HTN (hypertension)   . 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 Pneumonia 07/08/2013  . CAD- Moderate LAD disease with moderate to severe ostial first diagonal disease by cath 07/2013 - not ammendable to PCI. Medical therapy 07/07/2013  . Abnormal nuclear cardiac imaging test - Intermediate Risk 07/06/2013  . Problem with Foley catheter ([place]) 07/04/2013  . Adynamic ileus ([place]) 07/03/2013  . Left leg DVT ([place]) 07/03/2013  . Acute respiratory failure ([place]) 07/02/2013  . NSTEMI (non-ST elevated myocardial infarction) ([place]) 07/02/2013  . 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 HTN (hypertension) 07/02/2013  . Bipolar disorder (manic depression) ([place]) 07/02/2013  . Dyslipidemia 07/02/2013  . Acute on chronic renal insufficiency 07/02/2013  . Hypokalemia 07/02/2013  . Severe sepsis ([place]) 07/02/2013     Past Surgical History:  Procedure Laterality Date  . LEFT HEART CATHETERIZATION WITH CORONARY ANGIOGRAM N/A 07/06/2013   Procedure: LEFT HEART CATHETERIZATION WITH CORONARY ANGIOGRAM;  Surgeon: Leonie Man, MD;  Location[facility] C[facility];  Service: Cardiovascular;  Laterality: N/A;       History reviewed. No pertinent family history.  Social History   Tobacco Use  . Smoking status: Never Smoker  . Smokeless tobacco: Never Used  Substance Use Topics  . Alcohol use: No  . Drug use: Not on file    Home Medications Prior to Admission medications   Medication Sig Start Date End Date Taking? Authorizing Provider  aspirin 81 MG chewable tablet Chew 1 tablet (81 mg total) by mouth daily. 07/10/13   Rai, Vernelle Emerald, MD  clopidogrel (PLAVIX) 75 MG tablet Take 75 mg by mouth daily.    [provider]  famotidine (PEPCID) 20 MG tablet Take 1 tablet (20 mg total) by mouth 2 (two) times daily. Patient taking differently: Take 20 mg by mouth at bedtime.  07/10/13   Rai, Vernelle Emerald, MD  furosemide (LASIX) 40 MG tablet Take 1 tablet (40 mg total) by mouth daily. 07/10/13   Rai, Vernelle Emerald, MD  metoprolol tartrate (LOPRESSOR) 25 MG tablet Take 1 tablet (25 mg total) by mouth 2 (two) times daily. Patient not taking: Reported on 04/12/2016 07/10/13   Rai, Vernelle Emerald, MD  metoprolol tartrate (LOPRESSOR) 25 MG tablet Take 37.5 mg by mouth  2 (two) times daily.    [provider]  PARoxetine (PAXIL) 20 MG tablet Take 20 mg by mouth daily. 11/28/18   [provider]  potassium chloride SA (K-DUR,KLOR-CON) 20 MEQ tablet Take 2 tablets (40 mEq total) by mouth daily. 07/10/13   Rai, Delene Ruffini, MD  Rivaroxaban (XARELTO) 15 MG TABS tablet Take 1 tablet (15 mg total) by mouth 2 (two) times daily with a meal. Till 07/27/13, then start 20mg  daily 07/10/13 07/27/13  Rai, 07/29/13, MD  Rivaroxaban (XARELTO) 20 MG TABS tablet Take 1 tablet (20 mg total) by mouth daily with supper.  Start on 07/28/13, for total of 3 months Patient not taking: Reported on 04/12/2016 07/28/13   Rai, 07/30/13, MD  simvastatin (ZOCOR) 20 MG tablet Take 20 mg by mouth daily.    [provider]    Allergies    Patient has no known allergies.  Review of Systems   Review of Systems  Constitutional: Negative for chills and fever.  HENT: Negative for congestion and facial swelling.   Eyes: Negative for discharge and visual disturbance.  Respiratory: Negative for shortness of breath.   Cardiovascular: Negative for chest pain and palpitations.  Gastrointestinal: Negative for abdominal pain, diarrhea and vomiting.  Musculoskeletal: Negative for arthralgias and myalgias.  Skin: Negative for color change and rash.  Neurological: Negative for tremors, syncope and headaches.  Psychiatric/Behavioral: Negative for confusion and dysphoric mood.    Physical Exam Updated Vital Signs BP 137/76   Pulse 70   Temp 98.6 F (37 C) (Oral)   Resp 20   Wt 76.2 kg   SpO2 98%   BMI 27.12 kg/m   Physical Exam Vitals and nursing note reviewed.  Constitutional:      Appearance: He is well-developed.  HENT:     Head: Normocephalic and atraumatic.  Eyes:     Pupils: Pupils are equal, round, and reactive to light.  Neck:     Vascular: No JVD.  Cardiovascular:     Rate and Rhythm: Normal rate and regular rhythm.     Heart sounds: No murmur. No friction rub. No gallop.   Pulmonary:     Effort: No respiratory distress.     Breath sounds: No wheezing.  Abdominal:     General: There is no distension.     Tenderness: There is no guarding or rebound.  Musculoskeletal:        General: Normal range of motion.     Cervical back: Normal range of motion and neck supple.     Comments: Palpated from head to toe without any noted areas of bony tenderness.  Skin:    Coloration: Skin is not pale.     Findings: No rash.  Neurological:     Mental Status: He is alert and oriented to person, place,  and time.  Psychiatric:        Behavior: Behavior normal.     ED Results / Procedures / Treatments   Labs (all labs ordered are listed, but only abnormal results are displayed) Labs Reviewed - No data to display  EKG None  Radiology CT Head Wo Contrast  Result Date: 11/30/2019 CLINICAL DATA:  01/30/2020, hit head, history of dementia EXAM: CT HEAD WITHOUT CONTRAST TECHNIQUE: Contiguous axial images were obtained from the base of the skull through the vertex without intravenous contrast. COMPARISON:  12/30/2018 FINDINGS: Brain: No acute infarct or hemorrhage. Chronic lacunar infarct right basal ganglia. Lateral ventricles and remaining midline structures are unremarkable.  No acute extra-axial fluid collections. No mass effect. Vascular: No hyperdense vessel or unexpected calcification. Skull: Normal. Negative for fracture or focal lesion. Sinuses/Orbits: No acute finding. Other: None IMPRESSION: 1. Stable head CT, no acute process. Electronically Signed   By: Sharlet Salina M.D.   On: 11/30/2019 18:31   CT Cervical Spine Wo Contrast  Result Date: 11/30/2019 CLINICAL DATA:  Larey Seat, hit head, dimension EXAM: CT CERVICAL SPINE WITHOUT CONTRAST TECHNIQUE: Multidetector CT imaging of the cervical spine was performed without intravenous contrast. Multiplanar CT image reconstructions were also generated. COMPARISON:  04/13/2019 FINDINGS: Alignment: Alignment is grossly anatomic. Skull base and vertebrae: There is nonunion of the chronic fracture in the left lateral mass of C1. No change in alignment since prior study. There are no acute cervical spine fractures. Soft tissues and spinal canal: No prevertebral fluid or swelling. No visible canal hematoma. Disc levels: Stable mild spondylosis without significant bony encroachment upon the central canal or neural foramina. Upper chest: Airways patent.  Lung apices are clear. Other: Reconstructed images demonstrate no additional findings. IMPRESSION: 1. 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 2. No acute cervical spine fracture. Electronically Signed   By: Sharlet Salina M.D.   On: 11/30/2019 18:35    Procedures Procedures (including critical care time)  Medications Ordered in ED Medications - No data to display  ED Course  I have reviewed the triage vital signs and the nursing notes.  Pertinent labs & imaging results that were available during my care of the patient were reviewed by me and considered in my medical decision making (see chart for details).    MDM Rules/Calculators/A&P                      74 yo M with a chief complaint of a fall.  This was nonsyncopal in nature.  He has no complaints.  He was forced to come here by his nursing facility for imaging.  He did strike his head.  Will obtain a CT of the head and C-spine.  CT scan is negative.  Will discharge the patient home.  7:09 PM:  I have discussed the diagnosis/risks/treatment options with the patient and believe the pt to be eligible for discharge home to follow-up with PCP. We also discussed returning to the ED immediately if new or worsening sx occur. We discussed the sx which are most concerning (e.g., sudden worsening pain, fever, inability to tolerate by mouth) that necessitate immediate return. Medications administered to the patient during their visit and any new prescriptions provided to the patient are listed below.  Medications given during this visit Medications - No data to display   The patient appears reasonably screen and/or stabilized for discharge and I doubt any other medical condition or other [facility] requiring further screening, evaluation, or treatment in the ED at this time prior to discharge.   Final Clinical Impression(s) / ED Diagnoses Final diagnoses:  Fall, initial encounter    Rx / DC Orders ED Discharge Orders    None       Melene Plan, DO 11/30/19 1909

## 2019-11-30 NOTE — ED Triage Notes (Signed)
Pt BIB [facility] from [facility]. Pt was in a dance class when he fell and hit his head. No LOC or on blood thinners. Pt has hx of dementia, pt is A&Ox4, has no complaints. Pt has hx of cervical fracture, facility would like a scan to make sure there is no new fracture/worsening of old fracture.

## 2019-11-30 NOTE — ED Notes (Signed)
Dr. Floyd at bedside. 

## 2019-12-11 ENCOUNTER — Other Ambulatory Visit: Payer: Self-pay

## 2019-12-11 ENCOUNTER — Emergency Department (HOSPITAL_COMMUNITY)
Admission: EM | Admit: 2019-12-11 | Discharge: 2019-12-11 | Disposition: A | Discharge status: Home / Self Care | Payer: Medicare Other | Attending: Emergency Medicine | Admitting: Emergency Medicine

## 2019-12-11 ENCOUNTER — Emergency Department (HOSPITAL_COMMUNITY): Payer: Medicare Other

## 2019-12-11 ENCOUNTER — Encounter (HOSPITAL_COMMUNITY): Payer: Self-pay | Admitting: Emergency Medicine

## 2019-12-11 DIAGNOSIS — I2510 Atherosclerotic heart disease of native coronary artery without angina pectoris: Secondary | ICD-10-CM | POA: Diagnosis not present

## 2019-12-11 DIAGNOSIS — W19XXXA Unspecified fall, initial encounter: Secondary | ICD-10-CM

## 2019-12-11 DIAGNOSIS — Y9389 Activity, other specified: Secondary | ICD-10-CM | POA: Diagnosis not present

## 2019-12-11 DIAGNOSIS — S0093XA Contusion of unspecified part of head, initial encounter: Secondary | ICD-10-CM | POA: Diagnosis not present

## 2019-12-11 DIAGNOSIS — I252 Old myocardial infarction: Secondary | ICD-10-CM | POA: Diagnosis not present

## 2019-12-11 DIAGNOSIS — I10 Essential (primary) hypertension: Secondary | ICD-10-CM | POA: Diagnosis not present

## 2019-12-11 DIAGNOSIS — Z7901 Long term (current) use of anticoagulants: Secondary | ICD-10-CM | POA: Diagnosis not present

## 2019-12-11 DIAGNOSIS — Y92129 Unspecified place in nursing home as the place of occurrence of the external cause: Secondary | ICD-10-CM | POA: Diagnosis not present

## 2019-12-11 DIAGNOSIS — F0390 Unspecified dementia without behavioral disturbance: Secondary | ICD-10-CM | POA: Insufficient documentation

## 2019-12-11 DIAGNOSIS — Y999 Unspecified external cause status: Secondary | ICD-10-CM | POA: Insufficient documentation

## 2019-12-11 DIAGNOSIS — W1830XA Fall on same level, unspecified, initial encounter: Secondary | ICD-10-CM | POA: Insufficient documentation

## 2019-12-11 DIAGNOSIS — Z86718 Personal history of other venous thrombosis and embolism: Secondary | ICD-10-CM | POA: Insufficient documentation

## 2019-12-11 NOTE — ED Provider Notes (Signed)
[facility] EMERGENCY DEPARTMENT Provider Note   CSN: [id number] Arrival date & time: 12/11/19  1427     History No chief complaint on file.   Eric Zamora is a 74 y.o. male.  HPI   Patient presents after witnessed fall. Patient has multiple medical issues including dementia.  There are some limitation in the history provided by the patient due to this, level 5 caveat. History is provided by EMS providers, though the patient does answer questions in regards to his current situation seemingly appropriately. Per report patient was at his facility, had a witnessed ground-level fall, striking his right forehead.  Patient is known to take Plavix, and with this medication, qualifies as a level 2 trauma designation. The patient self states that he feels" nothing" in terms of pain, weakness, discomfort.  Though he is repetitive with his response to essentially all questions, he is also moving all extremities freely, speaking clearly, briefly, without any gross discomfort.  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 Medical therapy  Respiratory Acute respiratory failure (HCC) Pneumonia  Digestive Adynamic ileus (HCC)  Genitourinary Acute on chronic renal insufficiency  Other Bipolar disorder (manic depression) (HCC) Dyslipidemia Hypokalemia Severe sepsis (HCC) Problem with Foley catheter (HCC) Abnormal nuclear cardiac imaging test - Intermediate Risk   Social history: Patient non-smoker, lives in a facility. Home Medications Prior to Admission medications   Not on File    Allergies    Patient has  no allergy information on record.  Review of Systems   Review of Systems  Unable to perform ROS: Dementia    Physical Exam Updated Vital Signs There were no vitals taken for this visit.  Physical Exam Vitals and nursing note reviewed.  Constitutional:      General: He is not in acute distress.    Appearance: He is well-developed.  HENT:     Head: Normocephalic.   Eyes:     Conjunctiva/sclera: Conjunctivae normal.  Cardiovascular:     Rate and Rhythm: Normal rate and regular rhythm.  Pulmonary:     Effort: Pulmonary effort is normal. No respiratory distress.     Breath sounds: No stridor.  Abdominal:     General: There is no distension.  Skin:    General: Skin is warm and dry.  Neurological:     Mental Status: He is alert.     Cranial Nerves: No cranial nerve deficit.     Motor: No weakness, tremor, atrophy or abnormal muscle tone.  Psychiatric:        Cognition and Memory: Cognition is impaired.     ED Results / Procedures / Treatments    Radiology No results found.   ED Course  I have reviewed the triage vital signs and the nursing notes.  Pertinent labs & imaging results that were available during my care of the patient were reviewed by me and considered in my medical decision making (see chart for details).    MDM Rules/Calculators/A&P This elderly male on Xarelto, for A. fib presents after ground-level fall with right facial hematoma, after sustaining head trauma.  Patient is awake, alert, denies complaints, though history is somewhat difficult secondary to patient's dementia.  Patient's neuro  exam is reassuring, vitals reassuring, and should his CT scan head, neck both be similarly reassuring, he may be appropriate for discharge with outpatient follow-up, monitoring.  Dr. Ashok Cordia is aware of the patient.    Final Clinical Impression(s) / ED Diagnoses Final diagnoses:  Fall, initial encounter     Carmin Muskrat, MD 12/11/19 1506

## 2019-12-11 NOTE — ED Notes (Signed)
Patient given bag meal with drink. 

## 2019-12-11 NOTE — ED Notes (Signed)
Patient picked up by [facility], paperwork given to EMTs.

## 2019-12-11 NOTE — Progress Notes (Signed)
Orthopedic Tech Progress Note Patient Details:  Eric Zamora February 16, 1946 [id number] Level 2 trauma Patient ID: Eric Zamora, male   DOB: [birth year], 74 y.o.   MRN: [id number]   [name] 12/11/2019, 2:46 PM

## 2019-12-11 NOTE — ED Provider Notes (Signed)
Signed out br Dr Jeraldine Loots at 1600 to d/c to home when CTs resulted if negative for acute trauma.   CTs negative for acute traumatic injury.  Pt alert, content, nad.      Cathren Laine, MD 12/11/19 1724

## 2019-12-11 NOTE — Discharge Instructions (Addendum)
As discussed, your evaluation today has been largely reassuring.  But, it is important that you monitor your condition carefully, and do not hesitate to return to the ED if you develop new, or concerning changes in your condition. ? ?Otherwise, please follow-up with your physician for appropriate ongoing care. ? ?

## 2019-12-12 ENCOUNTER — Encounter (HOSPITAL_COMMUNITY): Payer: Self-pay

## 2020-02-01 ENCOUNTER — Emergency Department (HOSPITAL_COMMUNITY): Payer: Medicare Other

## 2020-02-01 ENCOUNTER — Emergency Department (HOSPITAL_COMMUNITY)
Admission: EM | Admit: 2020-02-01 | Discharge: 2020-02-02 | Disposition: A | Discharge status: Home / Self Care | Payer: Medicare Other | Attending: Emergency Medicine | Admitting: Emergency Medicine

## 2020-02-01 ENCOUNTER — Other Ambulatory Visit: Payer: Self-pay

## 2020-02-01 ENCOUNTER — Encounter (HOSPITAL_COMMUNITY): Payer: Self-pay | Admitting: Emergency Medicine

## 2020-02-01 DIAGNOSIS — S0285XA Fracture of orbit, unspecified, initial encounter for closed fracture: Secondary | ICD-10-CM

## 2020-02-01 DIAGNOSIS — F0390 Unspecified dementia without behavioral disturbance: Secondary | ICD-10-CM | POA: Diagnosis not present

## 2020-02-01 DIAGNOSIS — D6959 Other secondary thrombocytopenia: Secondary | ICD-10-CM | POA: Diagnosis not present

## 2020-02-01 DIAGNOSIS — Y92129 Unspecified place in nursing home as the place of occurrence of the external cause: Secondary | ICD-10-CM | POA: Diagnosis not present

## 2020-02-01 DIAGNOSIS — I252 Old myocardial infarction: Secondary | ICD-10-CM | POA: Insufficient documentation

## 2020-02-01 DIAGNOSIS — S02401A Maxillary fracture, unspecified, initial encounter for closed fracture: Secondary | ICD-10-CM | POA: Diagnosis not present

## 2020-02-01 DIAGNOSIS — W01198A Fall on same level from slipping, tripping and stumbling with subsequent striking against other object, initial encounter: Secondary | ICD-10-CM | POA: Diagnosis not present

## 2020-02-01 DIAGNOSIS — S0240EA Zygomatic fracture, right side, initial encounter for closed fracture: Secondary | ICD-10-CM | POA: Insufficient documentation

## 2020-02-01 DIAGNOSIS — R569 Unspecified convulsions: Secondary | ICD-10-CM | POA: Insufficient documentation

## 2020-02-01 DIAGNOSIS — S0231XA Fracture of orbital floor, right side, initial encounter for closed fracture: Secondary | ICD-10-CM | POA: Diagnosis not present

## 2020-02-01 DIAGNOSIS — Y999 Unspecified external cause status: Secondary | ICD-10-CM | POA: Insufficient documentation

## 2020-02-01 DIAGNOSIS — H11421 Conjunctival edema, right eye: Secondary | ICD-10-CM | POA: Insufficient documentation

## 2020-02-01 DIAGNOSIS — S01111A Laceration without foreign body of right eyelid and periocular area, initial encounter: Secondary | ICD-10-CM | POA: Insufficient documentation

## 2020-02-01 DIAGNOSIS — Z7901 Long term (current) use of anticoagulants: Secondary | ICD-10-CM | POA: Diagnosis not present

## 2020-02-01 DIAGNOSIS — Z79899 Other long term (current) drug therapy: Secondary | ICD-10-CM | POA: Diagnosis not present

## 2020-02-01 DIAGNOSIS — R600 Localized edema: Secondary | ICD-10-CM | POA: Diagnosis not present

## 2020-02-01 DIAGNOSIS — S0181XA Laceration without foreign body of other part of head, initial encounter: Secondary | ICD-10-CM | POA: Diagnosis not present

## 2020-02-01 DIAGNOSIS — Y9389 Activity, other specified: Secondary | ICD-10-CM | POA: Insufficient documentation

## 2020-02-01 DIAGNOSIS — I10 Essential (primary) hypertension: Secondary | ICD-10-CM | POA: Insufficient documentation

## 2020-02-01 DIAGNOSIS — W19XXXA Unspecified fall, initial encounter: Secondary | ICD-10-CM

## 2020-02-01 DIAGNOSIS — G40909 Epilepsy, unspecified, not intractable, without status epilepticus: Secondary | ICD-10-CM | POA: Diagnosis not present

## 2020-02-01 LAB — CBC WITH DIFFERENTIAL/PLATELET
Abs Immature Granulocytes: 0.02 10*3/uL (ref 0.00–0.07)
Basophils Absolute: 0.1 10*3/uL (ref 0.0–0.1)
Basophils Relative: 1 %
Eosinophils Absolute: 0.4 10*3/uL (ref 0.0–0.5)
Eosinophils Relative: 4 %
HCT: 36.3 % — ABNORMAL LOW (ref 39.0–52.0)
Hemoglobin: 11.9 g/dL — ABNORMAL LOW (ref 13.0–17.0)
Immature Granulocytes: 0 %
Lymphocytes Relative: 24 %
Lymphs Abs: 2.1 10*3/uL (ref 0.7–4.0)
MCH: 29.6 pg (ref 26.0–34.0)
MCHC: 32.8 g/dL (ref 30.0–36.0)
MCV: 90.3 fL (ref 80.0–100.0)
Monocytes Absolute: 1.3 10*3/uL — ABNORMAL HIGH (ref 0.1–1.0)
Monocytes Relative: 15 %
Neutro Abs: 4.7 10*3/uL (ref 1.7–7.7)
Neutrophils Relative %: 56 %
Platelets: 220 10*3/uL (ref 150–400)
RBC: 4.02 MIL/uL — ABNORMAL LOW (ref 4.22–5.81)
RDW: 13.1 % (ref 11.5–15.5)
WBC: 8.5 10*3/uL (ref 4.0–10.5)
nRBC: 0 % (ref 0.0–0.2)

## 2020-02-01 LAB — COMPREHENSIVE METABOLIC PANEL
ALT: 17 U/L (ref 0–44)
AST: 33 U/L (ref 15–41)
Albumin: 3.5 g/dL (ref 3.5–5.0)
Alkaline Phosphatase: 43 U/L (ref 38–126)
Anion gap: 14 (ref 5–15)
BUN: 24 mg/dL — ABNORMAL HIGH (ref 8–23)
CO2: 24 mmol/L (ref 22–32)
Calcium: 9 mg/dL (ref 8.9–10.3)
Chloride: 101 mmol/L (ref 98–111)
Creatinine, Ser: 1.59 mg/dL — ABNORMAL HIGH (ref 0.61–1.24)
GFR calc Af Amer: 48 mL/min — ABNORMAL LOW (ref >60)
GFR calc non Af Amer: 42 mL/min — ABNORMAL LOW (ref >60)
Glucose, Bld: 176 mg/dL — ABNORMAL HIGH (ref 70–99)
Potassium: 5 mmol/L (ref 3.5–5.1)
Sodium: 139 mmol/L (ref 135–145)
Total Bilirubin: 0.7 mg/dL (ref 0.3–1.2)
Total Protein: 6.8 g/dL (ref 6.5–8.1)

## 2020-02-01 NOTE — ED Triage Notes (Signed)
Pt here from [facility] nursing home for seizure and fall. Pt was standing at vending machine and fell to ground and had a seizure per staff, full body shaking, pt was incontinent of urine. Has lac to R eyebrow, swelling to R eye, and bleeding out of R nostril. Pt denies pain. Per staff pt has hx of seizures but no evidence of said history in charts, not on any seizure meds. Pt denies hx of seizures, states he just fell. Pt was not postictal, aox4, vss

## 2020-02-01 NOTE — ED Provider Notes (Signed)
[facility] EMERGENCY DEPARTMENT Provider Note   CSN: [id number] Arrival date & time: 02/01/20  2106     History Chief Complaint  Patient presents with  . Seizures  . Fall    Eric Zamora is a 74 y.o. male.  Patient is a 74 year old male living at a memory care unit who fell and struck his head on a vending machine with generalized shaking for 1 to 2 minutes that self resolved with no postictal state brought to the ED by EMS.  Patient has hemostatic laceration above the right eye and significant periorbital swelling around the right eye.  Vision grossly intact and appears at baseline.  The history is provided by the patient, medical records and the EMS personnel.  Illness Location:  Head Quality:  Trauma Severity:  Moderate Onset quality:  Sudden Timing:  Constant Progression:  Partially resolved Chronicity:  New Context:  Patient fell and struck head Relieved by:  Nothing tried Worsened by:  Nothing Ineffective treatments:  None tried Associated symptoms: loss of consciousness   Associated symptoms: no abdominal pain, no chest pain, no congestion, no cough, no fatigue, no fever, no headaches, no nausea, no shortness of breath and no vomiting        Past Medical History:  Diagnosis Date  . Bipolar affective disorder (HCC)   . Chronic renal insufficiency   . Dementia (HCC)   . Dyslipidemia   . HTN (hypertension)   . Obstructive cardiomyopathy [facility]) Jan 2010   gradient on TEE    Patient Active Problem List   Diagnosis Date Noted  . Pneumonia 07/08/2013  . CAD- Moderate LAD disease with moderate to severe ostial first diagonal disease by cath 07/2013 - not ammendable to PCI. Medical therapy 07/07/2013  . Abnormal nuclear cardiac imaging test - Intermediate Risk 07/06/2013  . Problem with Foley catheter (HCC) 07/04/2013  . Adynamic ileus (HCC) 07/03/2013  . Left leg DVT (HCC) 07/03/2013  . Acute respiratory failure (HCC) 07/02/2013  .  NSTEMI (non-ST elevated myocardial infarction) (HCC) 07/02/2013  . 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 HTN (hypertension) 07/02/2013  . Bipolar disorder (manic depression) (HCC) 07/02/2013  . Dyslipidemia 07/02/2013  . Acute on chronic renal insufficiency 07/02/2013  . Hypokalemia 07/02/2013  . Severe sepsis (HCC) 07/02/2013    Past Surgical History:  Procedure Laterality Date  . LEFT HEART CATHETERIZATION WITH CORONARY ANGIOGRAM N/A 07/06/2013   Procedure: LEFT HEART CATHETERIZATION WITH CORONARY ANGIOGRAM;  Surgeon: Marykay Lex, MD;  Location: [facility] CATH LAB;  Service: Cardiovascular;  Laterality: N/A;       History reviewed. No pertinent family history.  Social History   Tobacco Use  . Smoking status: Never Smoker  . Smokeless tobacco: Never Used  Substance Use Topics  . Alcohol use: No  . Drug use: Not on file    Home Medications Prior to Admission medications   Medication Sig Start Date End Date Taking? Authorizing Provider  acetaminophen (TYLENOL) 500 MG tablet Take 500 mg by mouth every 4 (four) hours as needed for fever or headache.   Yes [provider]  alum & mag hydroxide-simeth (MINTOX) 200-200-20 MG/5ML suspension Take 30 mLs by mouth as needed for indigestion or heartburn.   Yes [provider]  aspirin 81 MG chewable tablet Chew 1 tablet (81 mg total) by mouth daily. 07/10/13  Yes Rai, Ripudeep K, MD  clopidogrel (PLAVIX) 75 MG tablet Take 75 mg by mouth daily.   Yes  [provider]  divalproex (DEPAKOTE SPRINKLE) 125 MG capsule Take 125 mg by mouth daily.   Yes [provider]  ergocalciferol (VITAMIN D2) 1.25 MG (50000 UT) capsule Take 50,000 Units by mouth every 30 (thirty) days. On the 7 th of every month.   Yes [provider]  furosemide (LASIX) 40 MG tablet Take 1 tablet (40 mg total) by mouth daily. 07/10/13  Yes Rai, Ripudeep K, MD  guaiFENesin (ROBITUSSIN) 100 MG/5ML liquid  Take 200 mg by mouth 4 (four) times daily as needed for cough.   Yes [provider]  loperamide (IMODIUM) 2 MG capsule Take 2 mg by mouth as needed for diarrhea or loose stools.   Yes [provider]  magnesium hydroxide (MILK OF MAGNESIA) 400 MG/5ML suspension Take 30 mLs by mouth at bedtime as needed for mild constipation.   Yes [provider]  metoprolol tartrate (LOPRESSOR) 25 MG tablet Take 1 tablet (25 mg total) by mouth 2 (two) times daily. Patient taking differently: Take 37.5 mg by mouth 2 (two) times daily.  07/10/13  Yes Rai, Ripudeep K, MD  Neomycin-Bacitracin-Polymyxin (TRIPLE ANTIBIOTIC) [id number] OINT Apply 1 application topically as needed (for minor skin tears or abrasions).   Yes [provider]  PARoxetine (PAXIL) 20 MG tablet Take 20 mg by mouth daily.   Yes [provider]  potassium chloride SA (K-DUR,KLOR-CON) 20 MEQ tablet Take 2 tablets (40 mEq total) by mouth daily. 07/10/13  Yes Rai, Ripudeep K, MD  simvastatin (ZOCOR) 20 MG tablet Take 20 mg by mouth at bedtime.   Yes [provider]  famotidine (PEPCID) 20 MG tablet Take 1 tablet (20 mg total) by mouth 2 (two) times daily. Patient not taking: Reported on 02/01/2020 07/10/13   Cathren Harsh, MD  Rivaroxaban (XARELTO) 15 MG TABS tablet Take 1 tablet (15 mg total) by mouth 2 (two) times daily with a meal. Till 07/27/13, then start 20mg  daily 07/10/13 07/27/13  Rai, 07/29/13, MD  Rivaroxaban (XARELTO) 20 MG TABS tablet Take 1 tablet (20 mg total) by mouth daily with supper. Start on 07/28/13, for total of 3 months Patient not taking: Reported on 04/12/2016 07/28/13   07/30/13, MD    Allergies    Patient has no known allergies.  Review of Systems   Review of Systems  Constitutional: Negative for fatigue and fever.  HENT: Positive for facial swelling. Negative for congestion.   Respiratory: Negative for cough and shortness of breath.   Cardiovascular:  Negative for chest pain.  Gastrointestinal: Negative for abdominal pain, nausea and vomiting.  Skin: Positive for wound.  Neurological: Positive for loss of consciousness. Negative for headaches.  All other systems reviewed and are negative.   Physical Exam Updated Vital Signs BP 110/72 (BP Location: Right Arm)   Pulse 85   Temp 98.6 F (37 C) (Oral)   Resp (!) 22   SpO2 100%   Physical Exam Vitals and nursing note reviewed.  Constitutional:      Appearance: He is well-developed.  HENT:     Head: Normocephalic and atraumatic.     Right Ear: External ear normal.     Left Ear: External ear normal.     Nose: Nose normal.     Mouth/Throat:     Mouth: Mucous membranes are moist.  Eyes:     General: Vision grossly intact. Gaze aligned appropriately.     Extraocular Movements: Extraocular movements intact.     Right eye: Normal  extraocular motion and no nystagmus.     Left eye: Normal extraocular motion and no nystagmus.     Conjunctiva/sclera:     Right eye: Chemosis and hemorrhage present.     Left eye: Left conjunctiva is not injected. No chemosis, exudate or hemorrhage.    Pupils: Pupils are equal, round, and reactive to light.     Comments: Patient is periorbital edema around the right eye as well as ecchymosis and hemorrhage, no pain with extraocular eye movement.  Vision intact grossly and symmetric on 14 inch card.  Cardiovascular:     Rate and Rhythm: Normal rate and regular rhythm.     Heart sounds: No murmur.  Pulmonary:     Effort: Pulmonary effort is normal. No respiratory distress.     Breath sounds: Normal breath sounds.  Abdominal:     Palpations: Abdomen is soft.     Tenderness: There is no abdominal tenderness.  Musculoskeletal:        General: Normal range of motion.     Cervical back: Neck supple.  Skin:    General: Skin is warm and dry.  Neurological:     General: No focal deficit present.     Mental Status: He is alert. Mental status is at baseline.      Cranial Nerves: No cranial nerve deficit.     Sensory: No sensory deficit.     Motor: No weakness.  Psychiatric:        Mood and Affect: Mood normal.        Behavior: Behavior normal.     ED Results / Procedures / Treatments   Labs (all labs ordered are listed, but only abnormal results are displayed) Labs Reviewed  COMPREHENSIVE METABOLIC PANEL - Abnormal; Notable for the following components:      Result Value   Glucose, Bld 176 (*)    BUN 24 (*)    Creatinine, Ser 1.59 (*)    GFR calc non Af Amer 42 (*)    GFR calc Af Amer 48 (*)    All other components within normal limits  CBC WITH DIFFERENTIAL/PLATELET - Abnormal; Notable for the following components:   RBC 4.02 (*)    Hemoglobin 11.9 (*)    HCT 36.3 (*)    Monocytes Absolute 1.3 (*)    All other components within normal limits    EKG None  Radiology CT Head Wo Contrast  Result Date: 02/01/2020 CLINICAL DATA:  Seizure, fell EXAM: CT HEAD WITHOUT CONTRAST TECHNIQUE: Contiguous axial images were obtained from the base of the skull through the vertex without intravenous contrast. COMPARISON:  12/11/2019 FINDINGS: Brain: No acute infarct or hemorrhage. Lateral ventricles and midline structures are unremarkable. No acute extra-axial fluid collections. No mass effect. Vascular: No hyperdense vessel or unexpected calcification. Skull: Normal. Negative for fracture or focal lesion. Sinuses/Orbits: There are comminuted fractures of the right maxillary sinus and right orbit. Please refer to dedicated facial bone CT. There is severe soft tissue edema surrounding the right orbit, with ptosis of the right globe. Other: None. IMPRESSION: 1. Multiple fractures of the right maxillary sinus and right orbit, with ptosis of the right globe. Please refer to dedicated facial bone CT report. 2. No acute intracranial process. Electronically Signed   By: Randa Ngo M.D.   On: 02/01/2020 22:33   CT Cervical Spine Wo Contrast  Result Date:  02/01/2020 CLINICAL DATA:  Seizure, fell EXAM: CT CERVICAL SPINE WITHOUT CONTRAST TECHNIQUE: Multidetector CT imaging of the cervical spine  was performed without intravenous contrast. Multiplanar CT image reconstructions were also generated. COMPARISON:  12/11/2019 FINDINGS: Alignment: Alignment is anatomic. 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 No significant change since prior study. There are no acute displaced cervical spine fractures. Soft tissues and spinal canal: No prevertebral fluid or swelling. No visible canal hematoma. Disc levels:  No significant spondylosis or facet hypertrophy. Upper chest: Airway is patent.  Lung apices are clear. Other: Reconstructed images demonstrate no additional findings. IMPRESSION: 1. No acute displaced cervical spine fracture. 2. 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 Electronically Signed   By: Sharlet SalinaMichael  Brown M.D.   On: 02/01/2020 22:37   CT Maxillofacial Wo Contrast  Result Date: 02/01/2020 CLINICAL DATA:  Seizure, fell, right orbital swelling EXAM: CT MAXILLOFACIAL WITHOUT CONTRAST TECHNIQUE: Multidetector CT imaging of the maxillofacial structures was performed. Multiplanar CT image reconstructions were also generated. COMPARISON:  None. FINDINGS: Osseous: There are comminuted fractures of the anterior, lateral, and medial walls of the right maxillary sinus. There is moderate displacement of the anterior wall fracture. There is a comminuted fracture involving the inferior wall the right orbit, with minimal displacement. No extraocular muscle entrapment. An oblique fracture extends through the lateral wall the right orbit, minimally displaced. There is a minimally displaced fracture of the right zygomatic arch. Orbits: There is significant extraconal gas within the right orbit. There is ptosis of the right globe. Extra-ocular muscles appear intact with no extraocular muscle entrapment.  Sinuses: There is complete opacification of the right maxillary sinus. Remaining sinuses are clear. Soft tissues: There is significant right periorbital soft tissue edema. Remaining soft tissues are unremarkable. Limited intracranial: No significant or unexpected finding. IMPRESSION: 1. Fractures of the inferior and lateral walls of the right orbit, with significant extraconal gas and resulting ptosis of the right globe. 2. Comminuted fractures of the right maxillary sinus, with complete opacification. 3. Minimally displaced right zygomatic arch fracture. 4. Severe right periorbital soft tissue edema. Electronically Signed   By: Sharlet SalinaMichael  Brown M.D.   On: 02/01/2020 22:42    Procedures .Marland Kitchen.Laceration Repair  Date/Time: 02/01/2020 11:05 PM Performed by: Janeece FittingLandsee, Stanley Lyness, MD Authorized by: Laurence SpatesLittle, Rachel Morgan, MD   Consent:    Consent obtained:  Verbal   Consent given by:  Patient   Risks discussed:  Need for additional repair, infection and poor cosmetic result   Alternatives discussed:  No treatment Anesthesia (see MAR for exact dosages):    Anesthesia method:  None Laceration details:    Location:  Face   Face location:  R eyebrow   Length (cm):  3 Repair type:    Repair type:  Simple Exploration:    Wound exploration: entire depth of wound probed and visualized     Wound extent: no areolar tissue violation noted, no fascia violation noted, no foreign bodies/material noted, no muscle damage noted, no nerve damage noted, no tendon damage noted and no vascular damage noted     Contaminated: no   Treatment:    Area cleansed with:  Saline   Amount of cleaning:  Standard   Irrigation solution:  Sterile saline   Irrigation method:  Syringe   Visualized foreign bodies/material removed: no   Skin repair:    Repair method:  Tissue adhesive Approximation:    Approximation:  Close Post-procedure details:    Dressing:  Open (no dressing)   Patient tolerance of procedure:  Tolerated well, no  immediate  complications   (including critical care time)  Medications Ordered in ED Medications - No data to display  ED Course  I have reviewed the triage vital signs and the nursing notes.  Pertinent labs & imaging results that were available during my care of the patient were reviewed by me and considered in my medical decision making (see chart for details).  Clinical Course as of Feb 02 1316  Thu Feb 01, 2020  [address] [place], [phone] gettting chips from the vending machine, fell on the floor and stated shaking. Maybe a few minutes. Memory care unit, locked. May have had a seizure a  few years ago. Tami at [facility] is his guardian.    [AL]  2205 Patient evaluated, vision grossly intact.  Patient has some Subconjunctival hemorrhage and chemosis as well as periorbital swelling   [AL]    Clinical Course User Index [AL] Janeece Fitting, MD   MDM Rules/Calculators/A&P                      Differential diagnosis: Concussion, head bleed, facial fractures, facial trauma, syncope, seizure-like activity, eye injury  ED physician interpretation of imaging: CT head without acute bleeds, CT face with multiple facial fractures on the right ED physician interpretation of cardiac monitoring: None with arrhythmias observed during patient evaluation cardiac monitoring. ED physician interpretation of labs: No critical lab values identified on screening labs.  MDM: Patient is a 74 year old male who fell and struck his face resulting in orbital fractures, periorbital swelling and ecchymosis without eye involvement requiring ENT evaluation at bedside and close follow-up with ophthalmology.  Patient's vital signs are stable, patient afebrile.  Patient's physical exam is remarkable for injury to the right orbit, small laceration repaired per procedure note above.  Patient denying any eye pain or pain with eye movement.  Vision grossly intact.  Ophthalmology contacted via phone and  recommended close outpatient follow-up later on today in clinic.  Questionable seizure activity at facility, shaking lasted less than 2 minutes per facility, no postictal state.  Patient may have had a seizure several years ago but the story appears more likely to be vasovagal syncope followed by clonic jerks.  However recommendations to follow-up with neurology were given.  Patient's emergency conditions were evaluated and treated.  No further emergency medical intervention needed at this time.  Facility was called by nursing given signout on patient's needs for close follow-up.  Key discharge instructions:  -Soft Diet for 4 weeks.  -Follow up with ophthalmology later today,  -Apply ice every 4 hours to the right eye.  Keep head of bed elevated. -Follow-up with your primary care doctor for referral to neurology for further evaluation of your possible seizure disorder      Final Clinical Impression(s) / ED Diagnoses Final diagnoses:  Fall, initial encounter  Fracture of right orbital wall (HCC)  Closed fracture of maxillary sinus, initial encounter (HCC)  Closed fracture of right zygomatic arch, initial encounter (HCC)  Periorbital edema of right eye  Chemosis of right conjunctiva    Rx / DC Orders ED Discharge Orders    None       Janeece Fitting, MD 02/02/20 1317    Little, Ambrose Finland, MD 02/04/20 1142

## 2020-02-02 DIAGNOSIS — D6959 Other secondary thrombocytopenia: Secondary | ICD-10-CM

## 2020-02-02 DIAGNOSIS — S0181XA Laceration without foreign body of other part of head, initial encounter: Secondary | ICD-10-CM

## 2020-02-02 DIAGNOSIS — S0231XA Fracture of orbital floor, right side, initial encounter for closed fracture: Secondary | ICD-10-CM

## 2020-02-02 DIAGNOSIS — S0240EA Zygomatic fracture, right side, initial encounter for closed fracture: Secondary | ICD-10-CM | POA: Diagnosis not present

## 2020-02-02 DIAGNOSIS — G40909 Epilepsy, unspecified, not intractable, without status epilepticus: Secondary | ICD-10-CM | POA: Diagnosis not present

## 2020-02-02 DIAGNOSIS — T50905A Adverse effect of unspecified drugs, medicaments and biological substances, initial encounter: Secondary | ICD-10-CM

## 2020-02-02 DIAGNOSIS — S0240CA Maxillary fracture, right side, initial encounter for closed fracture: Secondary | ICD-10-CM

## 2020-02-02 DIAGNOSIS — S0281XA Fracture of other specified skull and facial bones, right side, initial encounter for closed fracture: Secondary | ICD-10-CM

## 2020-02-02 NOTE — Discharge Instructions (Addendum)
-  Soft Diet for 4 weeks.  -Follow up with ophthalmology later today,  -Apply ice every 4 hours to the right eye.  Keep head of bed elevated. -Follow-up with your primary care doctor for referral to neurology for further evaluation of your possible seizure disorder

## 2020-02-02 NOTE — ED Notes (Signed)
[facility] called to transport pt 

## 2020-02-02 NOTE — Consult Note (Signed)
Subjective:     Eric Zamora is a 74 y.o. male who presents to the ER via EMS after having had a seizure and fall on the ground.  He struck his right face on a table.  He currently complains of no pain.  He is having no bleeding.  His hearing and vision are unchanged.  He has no diplopia.  He is edentulous so occlusion cannot be determined however he denies any trismus and feels that his jaw opens and closes normally.  He denies any pain with phonation or change in voice.  Past medical history from surgical standpoint is significant for coronary disease.  He also takes 2 platelet inhibitors.  Patient History:  The following portions of the patient's history were reviewed and updated as appropriate: allergies, current medications, past family history, past medical history, past social history, past surgical history and problem list.  Review of Systems Pertinent items are noted in HPI.    Objective:    BP 119/69   Pulse 81   Temp 98.6 F (37 C) (Oral)   Resp 17   SpO2 100%   General:  alert and cooperative  Skin:  normal and no rash or abnormalities, small 1.5 cm superficial laceration right brow  Eyes: negative, conjunctivae/corneas clear. PERRL, EOM's intact. Fundi benign.  Vision is grossly normal.  No pain or restriction with extraocular movements.  No diplopia.  Mouth: MMM no lesions, thrush, ulcers, patient is edentulous  Lymph Nodes:  Cervical, supraclavicular, and axillary nodes normal.  Lungs:  clear to auscultation bilaterally  Heart:    Abdomen:   CVA:    Genitourinary:   Extremities:    Neurologic:  Alert and oriented x3. Gait normal. Reflexes and motor strength normal and symmetric. Cranial nerves 2-12 and sensation grossly intact.  Psychiatric:  normal mood, behavior, speech, dress, and thought processes    This patient has no palpable bony step-offs on the face.  His mandible is nontender.  His midface is stable.  His infraorbital rims are normal.  He has mild  tenderness overlying his right zygoma but no palpable step-off or deformity.  Intraorally, he has no mucosal tears and no palpable bony defects.  His cranial nerves are intact including the right infraorbital nerve sensory distribution.  His facial nerve is functioning normally.   CT scan -I reviewed the report and the films myself.  The patient's mandible is normal.  The patient's skull base and temporal bones are unremarkable.  The soft tissue of the neck is normal without free air or mass.  Patient's frontal bones are intact.  There is a right supraorbital nondisplaced fracture, and orbital floor fracture involving a nondisplaced rim, a nondisplaced lateral buttress fracture on the right, and an anterior maxillary sinus fracture.  There is blood in the maxillary sinus on the right.  The nasal bones are intact with a right septal deviation  Assessment:   Right ZMC fracture -nondisplaced Medication induced platelet dysfunction Right supraorbital facial laceration (1.5 cm) Seizure disorder   Plan:   This patient's fracture does not need to be plated.  It is nondisplaced and he has no trismus, diplopia, pain, or significant cosmetic deformity.  Patient should observe a soft diet (nothing he has to chew) for 4 weeks.  There is no need to stop his antiplatelet medications at this time nor does he need antibiotics from a facial fracture standpoint.  The brow laceration is well approximated and does not need to be closed.

## 2020-03-05 ENCOUNTER — Encounter (HOSPITAL_COMMUNITY): Payer: Self-pay

## 2020-03-05 ENCOUNTER — Other Ambulatory Visit: Payer: Self-pay

## 2020-03-05 ENCOUNTER — Emergency Department (HOSPITAL_COMMUNITY): Payer: Medicare Other

## 2020-03-05 ENCOUNTER — Emergency Department (HOSPITAL_COMMUNITY)
Admission: EM | Admit: 2020-03-05 | Discharge: 2020-03-05 | Disposition: A | Discharge status: Home / Self Care | Payer: Medicare Other | Attending: Emergency Medicine | Admitting: Emergency Medicine

## 2020-03-05 DIAGNOSIS — W01198A Fall on same level from slipping, tripping and stumbling with subsequent striking against other object, initial encounter: Secondary | ICD-10-CM | POA: Diagnosis not present

## 2020-03-05 DIAGNOSIS — Y998 Other external cause status: Secondary | ICD-10-CM | POA: Diagnosis not present

## 2020-03-05 DIAGNOSIS — Y9301 Activity, walking, marching and hiking: Secondary | ICD-10-CM | POA: Insufficient documentation

## 2020-03-05 DIAGNOSIS — S0990XA Unspecified injury of head, initial encounter: Secondary | ICD-10-CM | POA: Insufficient documentation

## 2020-03-05 DIAGNOSIS — F0280 Dementia in other diseases classified elsewhere without behavioral disturbance: Secondary | ICD-10-CM | POA: Insufficient documentation

## 2020-03-05 DIAGNOSIS — Z7902 Long term (current) use of antithrombotics/antiplatelets: Secondary | ICD-10-CM | POA: Diagnosis not present

## 2020-03-05 DIAGNOSIS — G309 Alzheimer's disease, unspecified: Secondary | ICD-10-CM | POA: Insufficient documentation

## 2020-03-05 DIAGNOSIS — Y92017 Garden or yard in single-family (private) house as the place of occurrence of the external cause: Secondary | ICD-10-CM | POA: Insufficient documentation

## 2020-03-05 DIAGNOSIS — W19XXXA Unspecified fall, initial encounter: Secondary | ICD-10-CM

## 2020-03-05 HISTORY — DX: Dementia in other diseases classified elsewhere, unspecified severity, without behavioral disturbance, psychotic disturbance, mood disturbance, and anxiety: F02.80

## 2020-03-05 LAB — CBG MONITORING, ED: Glucose-Capillary: 161 mg/dL — ABNORMAL HIGH (ref 70–99)

## 2020-03-05 NOTE — ED Provider Notes (Signed)
Emergency Department Provider Note   I have reviewed the triage vital signs and the nursing notes.   HISTORY  Chief Complaint Fall   HPI Eric Zamora is a 74 y.o. male with PMH of dementia presents to the ED from [place] after a witnessed ground-level fall.  The patient is on Plavix.  He was walking across the room when he fell and struck the back of his head.  There was a possible loss of consciousness for 3 to 4 seconds and then patient returned to his mental status baseline.  Per EMS, the patient has not been confused or repetitive more than normal.  He does have history of dementia.  Patient denies pain in any other locations.  He denies numbness or weakness.  Patient is on Plavix and thus by protocol was activated as a level 2 trauma on arrival.   Level 5 caveat: Dementia    Past Medical History:  Diagnosis Date  . Alzheimer disease (HCC)     There are no problems to display for this patient.  Allergies Patient has no known allergies.  No family history on file.  Social History Social History   Tobacco Use  . Smoking status: Not on file  Substance Use Topics  . Alcohol use: Not on file  . Drug use: Not on file    Review of Systems  Constitutional: No fever/chills Cardiovascular: Denies chest pain. Respiratory: Denies shortness of breath. Gastrointestinal: No abdominal pain.  No nausea, no vomiting.  No diarrhea.  No constipation. Genitourinary: Negative for dysuria. Musculoskeletal: Negative for back pain. Skin: Negative for rash. Neurological: Negative for headaches, focal weakness or numbness.  10-point ROS otherwise negative.  ____________________________________________   PHYSICAL EXAM:  VITAL SIGNS: ED Triage Vitals  Enc Vitals Group     BP 03/05/20 1717 138/80     Pulse Rate 03/05/20 1717 86     Resp 03/05/20 1717 (!) 22     Temp 03/05/20 1717 98.6 F (37 C)     Temp Source 03/05/20 1717 Oral     SpO2 03/05/20 1717 99 %    Constitutional: Alert with mild confusion. Well appearing and in no acute distress. Eyes: Conjunctivae are normal. PERRL. EOMI. Head: 4 cm hematoma to the right occipital scalp without laceration or abrasion.  Nose: No congestion/rhinnorhea. Mouth/Throat: Mucous membranes are moist.  Oropharynx non-erythematous. Neck: No stridor. C-collar in place.  Cardiovascular: Normal rate, regular rhythm. Good peripheral circulation. Grossly normal heart sounds.   Respiratory: Normal respiratory effort.  No retractions. Lungs CTAB. Gastrointestinal: Soft and nontender. No distention.  Musculoskeletal: No lower extremity tenderness nor edema. No gross deformities of extremities. No midline thoracic or lumbar spine tenderness.  Neurologic:  Normal speech and language. No gross focal neurologic deficits are appreciated.  Skin:  Skin is warm, dry and intact. No rash noted.  Results for orders placed or performed during the hospital encounter of 03/05/20  CBG monitoring, ED  Result Value Ref Range   Glucose-Capillary 161 (H) 70 - 99 mg/dL    EKG:   Rate: 86 PR: 254 QTc: 435  Sinus rhythm. Nonsepcific ST and T wave changes consistent with LVH. NO acute arrhythmia other than prolonged PR. No STEMI. ____________________________________________  RADIOLOGY  CT Head Wo Contrast  Result Date: 03/05/2020 CLINICAL DATA:  Status post fall. EXAM: CT HEAD WITHOUT CONTRAST TECHNIQUE: Contiguous axial images were obtained from the base of the skull through the vertex without intravenous contrast. COMPARISON:  February 01, 2020 FINDINGS:  Brain: There is mild cerebral atrophy with widening of the extra-axial spaces and ventricular dilatation. There are areas of decreased attenuation within the white matter tracts of the supratentorial brain, consistent with microvascular disease changes. Vascular: No hyperdense vessel or unexpected calcification. Skull: Normal. Negative for fracture or focal lesion. Sinuses/Orbits:  There is marked severity right maxillary sinus hyperdense mucosal thickening. Right maxillary sinus wall and right orbital floor fractures are again seen. A fracture deformity is again seen involving the left lateral mass of the C1 vertebral body. Other: Mild right posterior parietal scalp soft tissue swelling is seen. IMPRESSION: 1. Mild right posterior parietal scalp soft tissue swelling without evidence of an acute intracranial abnormality. 2. Stable right maxillary sinus wall and right orbital floor fractures. 3. Stable fracture deformity involving the left lateral mass of the C1 vertebral body. Electronically Signed   By: Aram Candela M.D.   On: 03/05/2020 18:21   CT Cervical Spine Wo Contrast  Result Date: 03/05/2020 CLINICAL DATA:  Status post fall. EXAM: CT CERVICAL SPINE WITHOUT CONTRAST TECHNIQUE: Multidetector CT imaging of the cervical spine was performed without intravenous contrast. Multiplanar CT image reconstructions were also generated. COMPARISON:  February 01, 2020 FINDINGS: Alignment: Normal. Skull base and vertebrae: Nonunion of a fracture deformity involving the left lateral mass of the C1 vertebral body is again seen. No new acute cervical spine fracture deformities are identified. Soft tissues and spinal canal: No prevertebral fluid or swelling. No visible canal hematoma. Disc levels: Mild anterior osteophyte formation is seen at the levels of C3-C4 and C6-C7. Very mild multilevel intervertebral disc space narrowing is noted. Normal bilateral multilevel facet joints are seen. Upper chest: Negative. Other: None. IMPRESSION: 1. 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 2. No new acute cervical spine fracture deformities are seen. Electronically Signed   By: Aram Candela M.D.   On: 03/05/2020 18:24    ____________________________________________   PROCEDURES  Procedure(s) performed:   Procedures  None   ____________________________________________   INITIAL IMPRESSION / ASSESSMENT AND PLAN / ED COURSE  Pertinent labs & imaging results that were available during my care of the patient were reviewed by me and considered in my medical decision making (see chart for details).   Patient presents to the emergency department after ground-level fall on Plavix.  He was activated as a level 2 trauma by EMS.  He has dementia but is awake, alert, cooperative.  Plan for CT imaging of the head and cervical spine.  No other imaging or labs at this time. Vitals are WNL.   06:45 PM  CT head and c-spine with no acute findings. Plan for discharge back to facility. Mental status unchanged.  ____________________________________________  FINAL CLINICAL IMPRESSION(S) / ED DIAGNOSES  Final diagnoses:  Fall, initial encounter  Injury of head, initial encounter    Note:  This document was prepared using Dragon voice recognition software and may include unintentional dictation errors.  Alona Bene, MD, [facility] Emergency Medicine    Avri Paiva, Arlyss Repress, MD 03/05/20 [phone]

## 2020-03-05 NOTE — Progress Notes (Signed)
Orthopedic Tech Progress Note Patient Details:  Eric Zamora Dec 31, 1945 [id number] Level 2 Trauma Patient ID: Eric Zamora, male   DOB: Sep 16, 1945, 74 y.o.   MRN: [id number]   Gerald Stabs 03/05/2020, 9:39 PM

## 2020-03-05 NOTE — Discharge Instructions (Signed)
You were seen today after a fall. Your CT scans of the head and neck were normal. You can apply ice to the back of your scalp to help with swelling. Return with any confusion, sudden/severe headache, weakness/numbness, or other sudden severe symptoms.

## 2020-03-05 NOTE — ED Triage Notes (Signed)
Pt BIB [facility] from [facility] due to a fall. Pt was in a standing position moving quickly across the room and fall. Pt denies any pain staff states possible LOC for 3-4 secs. Pt has dementia at baseline but is alert and oriented. Pt is on plavix.

## 2020-03-05 NOTE — ED Notes (Signed)
[facility] called to transport pt 

## 2020-03-06 ENCOUNTER — Encounter (HOSPITAL_COMMUNITY): Payer: Self-pay | Admitting: Emergency Medicine

## 2020-03-16 ENCOUNTER — Emergency Department (HOSPITAL_COMMUNITY): Payer: Medicare Other

## 2020-03-16 ENCOUNTER — Emergency Department (HOSPITAL_COMMUNITY)
Admission: EM | Admit: 2020-03-16 | Discharge: 2020-03-16 | Disposition: A | Discharge status: Home / Self Care | Payer: Medicare Other | Attending: Emergency Medicine | Admitting: Emergency Medicine

## 2020-03-16 ENCOUNTER — Encounter (HOSPITAL_COMMUNITY): Payer: Self-pay

## 2020-03-16 DIAGNOSIS — Y92009 Unspecified place in unspecified non-institutional (private) residence as the place of occurrence of the external cause: Secondary | ICD-10-CM | POA: Insufficient documentation

## 2020-03-16 DIAGNOSIS — Z79899 Other long term (current) drug therapy: Secondary | ICD-10-CM | POA: Diagnosis not present

## 2020-03-16 DIAGNOSIS — G309 Alzheimer's disease, unspecified: Secondary | ICD-10-CM | POA: Insufficient documentation

## 2020-03-16 DIAGNOSIS — Y999 Unspecified external cause status: Secondary | ICD-10-CM | POA: Diagnosis not present

## 2020-03-16 DIAGNOSIS — Z7901 Long term (current) use of anticoagulants: Secondary | ICD-10-CM | POA: Diagnosis not present

## 2020-03-16 DIAGNOSIS — I2510 Atherosclerotic heart disease of native coronary artery without angina pectoris: Secondary | ICD-10-CM | POA: Insufficient documentation

## 2020-03-16 DIAGNOSIS — Z86711 Personal history of pulmonary embolism: Secondary | ICD-10-CM | POA: Diagnosis not present

## 2020-03-16 DIAGNOSIS — W19XXXA Unspecified fall, initial encounter: Secondary | ICD-10-CM | POA: Diagnosis not present

## 2020-03-16 DIAGNOSIS — I10 Essential (primary) hypertension: Secondary | ICD-10-CM | POA: Insufficient documentation

## 2020-03-16 DIAGNOSIS — R2689 Other abnormalities of gait and mobility: Secondary | ICD-10-CM | POA: Diagnosis present

## 2020-03-16 DIAGNOSIS — Z7982 Long term (current) use of aspirin: Secondary | ICD-10-CM | POA: Diagnosis not present

## 2020-03-16 DIAGNOSIS — Y9301 Activity, walking, marching and hiking: Secondary | ICD-10-CM | POA: Diagnosis not present

## 2020-03-16 DIAGNOSIS — Z86718 Personal history of other venous thrombosis and embolism: Secondary | ICD-10-CM | POA: Insufficient documentation

## 2020-03-16 LAB — COMPREHENSIVE METABOLIC PANEL
ALT: 14 U/L (ref 0–44)
AST: 22 U/L (ref 15–41)
Albumin: 3.6 g/dL (ref 3.5–5.0)
Alkaline Phosphatase: 35 U/L — ABNORMAL LOW (ref 38–126)
Anion gap: 10 (ref 5–15)
BUN: 31 mg/dL — ABNORMAL HIGH (ref 8–23)
CO2: 24 mmol/L (ref 22–32)
Calcium: 9.1 mg/dL (ref 8.9–10.3)
Chloride: 109 mmol/L (ref 98–111)
Creatinine, Ser: 1.92 mg/dL — ABNORMAL HIGH (ref 0.61–1.24)
GFR calc Af Amer: 39 mL/min — ABNORMAL LOW (ref >60)
GFR calc non Af Amer: 33 mL/min — ABNORMAL LOW (ref >60)
Glucose, Bld: 136 mg/dL — ABNORMAL HIGH (ref 70–99)
Potassium: 4.6 mmol/L (ref 3.5–5.1)
Sodium: 143 mmol/L (ref 135–145)
Total Bilirubin: 0.4 mg/dL (ref 0.3–1.2)
Total Protein: 6.9 g/dL (ref 6.5–8.1)

## 2020-03-16 LAB — CBC WITH DIFFERENTIAL/PLATELET
Abs Immature Granulocytes: 0.03 10*3/uL (ref 0.00–0.07)
Basophils Absolute: 0.1 10*3/uL (ref 0.0–0.1)
Basophils Relative: 1 %
Eosinophils Absolute: 0.3 10*3/uL (ref 0.0–0.5)
Eosinophils Relative: 5 %
HCT: 36.7 % — ABNORMAL LOW (ref 39.0–52.0)
Hemoglobin: 11.7 g/dL — ABNORMAL LOW (ref 13.0–17.0)
Immature Granulocytes: 1 %
Lymphocytes Relative: 33 %
Lymphs Abs: 1.9 10*3/uL (ref 0.7–4.0)
MCH: 29.6 pg (ref 26.0–34.0)
MCHC: 31.9 g/dL (ref 30.0–36.0)
MCV: 92.9 fL (ref 80.0–100.0)
Monocytes Absolute: 0.8 10*3/uL (ref 0.1–1.0)
Monocytes Relative: 14 %
Neutro Abs: 2.6 10*3/uL (ref 1.7–7.7)
Neutrophils Relative %: 46 %
Platelets: 180 10*3/uL (ref 150–400)
RBC: 3.95 MIL/uL — ABNORMAL LOW (ref 4.22–5.81)
RDW: 13.3 % (ref 11.5–15.5)
WBC: 5.6 10*3/uL (ref 4.0–10.5)
nRBC: 0 % (ref 0.0–0.2)

## 2020-03-16 LAB — PROTIME-INR
INR: 1 (ref 0.8–1.2)
Prothrombin Time: 13.2 seconds (ref 11.4–15.2)

## 2020-03-16 NOTE — ED Provider Notes (Signed)
[facility] EMERGENCY DEPARTMENT Provider Note   CSN: [id number] Arrival date & time: 03/16/20  1736     History Chief Complaint  Patient presents with  . Fall    Eric Zamora is a 74 y.o. male.  The history is provided by the patient and the EMS personnel.  Trauma Mechanism of injury: fall Injury location: Patient denies any pain. Incident location: home (Patient arrives from SNF.  Fall unwitnessed.) Arrived directly from scene: yes   Fall:      Fall occurred: walking (Patient normally uses wheelchair, however was ambulating out of wheelchair.  Patient states he lost balance.  Mechanical fall.)      Impact surface: hard floor      Entrapped after fall: no      Suspicion of alcohol use: no      Suspicion of drug use: no  EMS/PTA data:      Loss of consciousness: no      Immobilization: C-collar  Current symptoms:      Associated symptoms:            Denies abdominal pain, back pain, chest pain, headache, loss of consciousness, nausea, neck pain and vomiting.   Relevant PMH:      Pharmacological risk factors:            Antiplatelet therapy.   Patient denies chest pain, shortness of breath, back pain, syncope prior to fall.  Patient states he remembers fall.  Patient denies LOC, nausea, vomiting, headache, dizziness.    Past Medical History:  Diagnosis Date  . Alzheimer disease (HCC)   . Bipolar affective disorder (HCC)   . Chronic renal insufficiency   . Dementia (HCC)   . Dyslipidemia   . HTN (hypertension)   . 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 Pneumonia 07/08/2013  . CAD- Moderate LAD disease with moderate to severe ostial first diagonal disease by cath 07/2013 - not ammendable to PCI. Medical therapy 07/07/2013  . Abnormal nuclear cardiac imaging test - Intermediate Risk 07/06/2013  . Problem with Foley catheter (HCC) 07/04/2013  . Adynamic  ileus (HCC) 07/03/2013  . Left leg DVT (HCC) 07/03/2013  . Acute respiratory failure (HCC) 07/02/2013  . NSTEMI (non-ST elevated myocardial infarction) (HCC) 07/02/2013  . 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 HTN (hypertension) 07/02/2013  . Bipolar disorder (manic depression) (HCC) 07/02/2013  . Dyslipidemia 07/02/2013  . Acute on chronic renal insufficiency 07/02/2013  . Hypokalemia 07/02/2013  . Severe sepsis (HCC) 07/02/2013    Past Surgical History:  Procedure Laterality Date  . LEFT HEART CATHETERIZATION WITH CORONARY ANGIOGRAM N/A 07/06/2013   Procedure: LEFT HEART CATHETERIZATION WITH CORONARY ANGIOGRAM;  Surgeon: Marykay Lex, MD;  Location[facility] CATH LAB;  Service: Cardiovascular;  Laterality: N/A;       History reviewed. No pertinent family history.  Social History   Tobacco Use  . Smoking status: Never Smoker  . Smokeless tobacco: Never Used  Vaping Use  . Vaping Use: Never used  Substance Use Topics  . Alcohol use: No  . Drug use: Not on file    Home Medications Prior to Admission medications   Medication Sig Start Date End Date Taking? Authorizing Provider  acetaminophen (TYLENOL) 500 MG tablet Take 500 mg by mouth every 4 (four) hours as needed for fever or headache.    [provider]  alum & mag hydroxide-simeth (MINTOX) 200-200-20 MG/5ML suspension Take 30 mLs by mouth as needed for indigestion or heartburn.    [provider]  aspirin 81 MG chewable tablet Chew 1 tablet (81 mg total) by mouth daily. 07/10/13   Rai, Delene Ruffiniipudeep K, MD  clopidogrel (PLAVIX) 75 MG tablet Take 75 mg by mouth daily.    [provider]  divalproex (DEPAKOTE SPRINKLE) 125 MG capsule Take 125 mg by mouth daily.    [provider]  ergocalciferol (VITAMIN D2) 1.25 MG (50000 UT) capsule Take 50,000 Units by mouth every 30 (thirty) days. On the 7 th of every month.    [provider]  famotidine (PEPCID) 20  MG tablet Take 1 tablet (20 mg total) by mouth 2 (two) times daily. Patient not taking: Reported on 02/01/2020 07/10/13   Rai, Delene Ruffiniipudeep K, MD  furosemide (LASIX) 40 MG tablet Take 1 tablet (40 mg total) by mouth daily. 07/10/13   Rai, Delene Ruffiniipudeep K, MD  guaiFENesin (ROBITUSSIN) 100 MG/5ML liquid Take 200 mg by mouth 4 (four) times daily as needed for cough.    [provider]  loperamide (IMODIUM) 2 MG capsule Take 2 mg by mouth as needed for diarrhea or loose stools.    [provider]  magnesium hydroxide (MILK OF MAGNESIA) 400 MG/5ML suspension Take 30 mLs by mouth at bedtime as needed for mild constipation.    [provider]  metoprolol tartrate (LOPRESSOR) 25 MG tablet Take 1 tablet (25 mg total) by mouth 2 (two) times daily. Patient taking differently: Take 37.5 mg by mouth 2 (two) times daily.  07/10/13   Rai, Delene Ruffiniipudeep K, MD  Neomycin-Bacitracin-Polymyxin (TRIPLE ANTIBIOTIC) [phone] OINT Apply 1 application topically as needed (for minor skin tears or abrasions).    [provider]  PARoxetine (PAXIL) 20 MG tablet Take 20 mg by mouth daily.    [provider]  potassium chloride SA (K-DUR,KLOR-CON) 20 MEQ tablet Take 2 tablets (40 mEq total) by mouth daily. 07/10/13   Rai, Delene Ruffiniipudeep K, MD  Rivaroxaban (XARELTO) 15 MG TABS tablet Take 1 tablet (15 mg total) by mouth 2 (two) times daily with a meal. Till 07/27/13, then start 20mg  daily 07/10/13 07/27/13  Rai, Delene Ruffiniipudeep K, MD  Rivaroxaban (XARELTO) 20 MG TABS tablet Take 1 tablet (20 mg total) by mouth daily with supper. Start on 07/28/13, for total of 3 months Patient not taking: Reported on 04/12/2016 07/28/13   Rai, Delene Ruffiniipudeep K, MD  simvastatin (ZOCOR) 20 MG tablet Take 20 mg by mouth at bedtime.    [provider]    Allergies    Patient has no known allergies.  Review of Systems   Review of Systems  Constitutional: Negative for chills and fever.  HENT: Negative for congestion,  rhinorrhea and sore throat.   Eyes: Negative for redness and visual disturbance.  Respiratory: Negative for cough and shortness of breath.   Cardiovascular: Negative for chest pain and leg swelling.  Gastrointestinal: Negative for abdominal pain, nausea and vomiting.  Musculoskeletal: Positive for gait problem. Negative for back pain and neck pain.       Patient normally uses wheelchair  Skin: Negative for rash and wound.  Neurological: Negative for dizziness, loss of consciousness, syncope, facial asymmetry, light-headedness and headaches.  Psychiatric/Behavioral: Negative.  Negative for confusion.    Physical Exam Updated Vital Signs BP 120/76   Pulse 82   Temp 98 F (36.7 C) (Oral)   Resp 16   Ht 5\' 5"  (  1.651 m)   Wt 82.6 kg   SpO2 99%   BMI 30.29 kg/m   Physical Exam Vitals and nursing note reviewed.  Constitutional:      General: He is not in acute distress.    Appearance: Normal appearance. He is overweight. He is not ill-appearing, toxic-appearing or diaphoretic.  HENT:     Head: Normocephalic and atraumatic.     Nose: Nose normal.     Mouth/Throat:     Mouth: Mucous membranes are moist.  Eyes:     General: No scleral icterus.    Extraocular Movements: Extraocular movements intact.     Conjunctiva/sclera: Conjunctivae normal.  Cardiovascular:     Rate and Rhythm: Normal rate and regular rhythm.     Heart sounds: Normal heart sounds. No murmur heard.   Pulmonary:     Effort: Pulmonary effort is normal. No respiratory distress.     Breath sounds: Normal breath sounds. No wheezing or rales.  Chest:     Chest wall: No tenderness.  Abdominal:     Palpations: Abdomen is soft.     Tenderness: There is no abdominal tenderness. There is no guarding or rebound.  Musculoskeletal:        General: Normal range of motion.     Right lower leg: No edema.     Left lower leg: No edema.  Skin:    General: Skin is warm.     Findings: No rash.  Neurological:     General:  No focal deficit present.     Mental Status: He is alert and oriented to person, place, and time. Mental status is at baseline.     Cranial Nerves: No cranial nerve deficit.     Sensory: No sensory deficit.     Motor: No weakness.  Psychiatric:        Mood and Affect: Mood normal.        Behavior: Behavior normal.        Thought Content: Thought content normal.        Judgment: Judgment normal.     ED Results / Procedures / Treatments   Labs (all labs ordered are listed, but only abnormal results are displayed) Labs Reviewed  CBC WITH DIFFERENTIAL/PLATELET - Abnormal; Notable for the following components:      Result Value   RBC 3.95 (*)    Hemoglobin 11.7 (*)    HCT 36.7 (*)    All other components within normal limits  COMPREHENSIVE METABOLIC PANEL - Abnormal; Notable for the following components:   Glucose, Bld 136 (*)    BUN 31 (*)    Creatinine, Ser 1.92 (*)    Alkaline Phosphatase 35 (*)    GFR calc non Af Amer 33 (*)    GFR calc Af Amer 39 (*)    All other components within normal limits  PROTIME-INR    EKG EKG Interpretation  Date/Time:  Saturday March 16 2020 17:44:01 EDT Ventricular Rate:  85 PR Interval:    QRS Duration: 88 QT Interval:  379 QTC Calculation: 451 R Axis:   67 Text Interpretation: Ectopic atrial rhythm Probable LVH with secondary repol abnrm Abnormal T, probable ischemia, anterior leads No significant change since last tracing Confirmed by Richardean Canal ([id number]) on 03/16/2020 5:45:15 PM   Radiology CT Head Wo Contrast  Result Date: 03/16/2020 CLINICAL DATA:  Larey Seat and hit the back of his head with subsequent loss of consciousness. EXAM: CT HEAD WITHOUT CONTRAST CT CERVICAL SPINE WITHOUT CONTRAST TECHNIQUE:  Multidetector CT imaging of the head and cervical spine was performed following the standard protocol without intravenous contrast. Multiplanar CT image reconstructions of the cervical spine were also generated. COMPARISON:  03/05/2020  FINDINGS: CT HEAD FINDINGS Brain: Stable moderately enlarged ventricles and subarachnoid spaces. No intracranial hemorrhage, mass lesion or CT evidence of acute infarction. Vascular: No hyperdense vessel or unexpected calcification. Skull: Normal. Negative for fracture or focal lesion. Sinuses/Orbits: Previously demonstrated right maxillary sinus and orbital floor fractures. Associated moderate mucosal thickening and fluid in the right maxillary sinus. Other: Stable included portion of the left C1 fracture. CT CERVICAL SPINE FINDINGS Alignment: Normal. Skull base and vertebrae: No significant change in the previously demonstrated comminuted left C1 fracture. No new fractures are seen. Soft tissues and spinal canal: No prevertebral fluid or swelling. No visible canal hematoma. Disc levels:  Mild multilevel degenerative changes. Upper chest: Clear lung apices. Other: Bilateral carotid artery calcifications. IMPRESSION: 1. No skull fracture or intracranial hemorrhage. 2. No significant change in the previously demonstrated comminuted left C1 fracture. 3. No new cervical spine fracture or subluxation. 4. Stable moderate diffuse cerebral and cerebellar atrophy. 5. Previously demonstrated right maxillary sinus and orbital floor fractures. 6. Mild cervical spine degenerative changes. 7. Bilateral carotid artery atheromatous calcifications. Electronically Signed   By: Beckie Salts M.D.   On: 03/16/2020 19:01   CT Cervical Spine Wo Contrast  Result Date: 03/16/2020 CLINICAL DATA:  Larey Seat and hit the back of his head with subsequent loss of consciousness. EXAM: CT HEAD WITHOUT CONTRAST CT CERVICAL SPINE WITHOUT CONTRAST TECHNIQUE: Multidetector CT imaging of the head and cervical spine was performed following the standard protocol without intravenous contrast. Multiplanar CT image reconstructions of the cervical spine were also generated. COMPARISON:  03/05/2020 FINDINGS: CT HEAD FINDINGS Brain: Stable moderately enlarged  ventricles and subarachnoid spaces. No intracranial hemorrhage, mass lesion or CT evidence of acute infarction. Vascular: No hyperdense vessel or unexpected calcification. Skull: Normal. Negative for fracture or focal lesion. Sinuses/Orbits: Previously demonstrated right maxillary sinus and orbital floor fractures. Associated moderate mucosal thickening and fluid in the right maxillary sinus. Other: Stable included portion of the left C1 fracture. CT CERVICAL SPINE FINDINGS Alignment: Normal. Skull base and vertebrae: No significant change in the previously demonstrated comminuted left C1 fracture. No new fractures are seen. Soft tissues and spinal canal: No prevertebral fluid or swelling. No visible canal hematoma. Disc levels:  Mild multilevel degenerative changes. Upper chest: Clear lung apices. Other: Bilateral carotid artery calcifications. IMPRESSION: 1. No skull fracture or intracranial hemorrhage. 2. No significant change in the previously demonstrated comminuted left C1 fracture. 3. No new cervical spine fracture or subluxation. 4. Stable moderate diffuse cerebral and cerebellar atrophy. 5. Previously demonstrated right maxillary sinus and orbital floor fractures. 6. Mild cervical spine degenerative changes. 7. Bilateral carotid artery atheromatous calcifications. Electronically Signed   By: Beckie Salts M.D.   On: 03/16/2020 19:01    Procedures Procedures (including critical care time)  Medications Ordered in ED Medications - No data to display  ED Course  I have reviewed the triage vital signs and the nursing notes.  Pertinent labs & imaging results that were available during my care of the patient were reviewed by me and considered in my medical decision making (see chart for details).    MDM Rules/Calculators/A&P                          Eric Zamora is a 74 y.o.  male who presents as a level 2 trauma activation after sustaining injuries in a fall on [facility] earlier today. History is  obtained from EMS as well as the patient. Briefly, the patient who normally uses wheelchair was ambulating out of chair when he lost his balance. Denies CP, SOB prior to fall.  On initial exam patient was GCS 15 ABC intact, and hemodynamically stable. The patient denies pain or injury from fall. History consistent with mechanical fall. Pt without evidence of injury on PE. No focal findings on neuro exam. Due to [facility] use imaging completed of Head/Cspine without acute findings. Screening labs completed including CBC, CMP, PT/INR that did not demonstrate acute abnormalities. Patient overall well appearing. C collar was cleared by Dr. Silverio Lay at bedside. Based on the above findings, I believe patient is hemodynamically stable for discharge. Strict return precautions given. Patient discharged in stable condition.    Final Clinical Impression(s) / ED Diagnoses Final diagnoses:  Fall, initial encounter  Accident due to mechanical fall without injury, initial encounter    Rx / DC Orders ED Discharge Orders    None       Golden Pop, MD 03/16/20 2253    Charlynne Pander, MD 03/17/20 [phone]

## 2020-03-16 NOTE — ED Notes (Signed)
Pt hooked up to monitor, awaiting PTAR.

## 2020-03-16 NOTE — ED Notes (Signed)
Patient transported to CT 

## 2020-03-16 NOTE — ED Notes (Signed)
All discharge instructions reviewed with pt and nurse from [facility]. Denies questions at this time. Pt discharged to [facility] for transport back to facility without issue.

## 2020-03-16 NOTE — Progress Notes (Signed)
Orthopedic Tech Progress Note Patient Details:  Eric Zamora [facility] 1945-12-17 [id number] Level 2 Trauma. Patient ID: Eric Zamora, male   DOB: October 12, 1945, 74 y.o.   MRN: [id number]   Eric Zamora 03/16/2020, 6:00 PM

## 2020-03-16 NOTE — ED Notes (Signed)
PTAR notes

## 2020-03-16 NOTE — ED Notes (Signed)
Called CT. Pt is next on list.

## 2020-03-16 NOTE — ED Triage Notes (Signed)
Pt bib [facility]itnessed [place] [facility]. Pt on plavix. No loc, cp, nvd. Pt has alzheimer's, but A&O.VS 0/10 pain

## 2020-04-11 ENCOUNTER — Emergency Department (HOSPITAL_COMMUNITY)
Admission: EM | Admit: 2020-04-11 | Discharge: 2020-04-12 | Disposition: A | Discharge status: Skilled Nursing Facility | Payer: Medicare Other | Attending: Emergency Medicine | Admitting: Emergency Medicine

## 2020-04-11 ENCOUNTER — Other Ambulatory Visit: Payer: Self-pay

## 2020-04-11 ENCOUNTER — Emergency Department (HOSPITAL_COMMUNITY): Payer: Medicare Other

## 2020-04-11 DIAGNOSIS — W19XXXA Unspecified fall, initial encounter: Secondary | ICD-10-CM

## 2020-04-11 DIAGNOSIS — Z79899 Other long term (current) drug therapy: Secondary | ICD-10-CM | POA: Insufficient documentation

## 2020-04-11 DIAGNOSIS — Z7982 Long term (current) use of aspirin: Secondary | ICD-10-CM | POA: Insufficient documentation

## 2020-04-11 DIAGNOSIS — I2510 Atherosclerotic heart disease of native coronary artery without angina pectoris: Secondary | ICD-10-CM | POA: Diagnosis not present

## 2020-04-11 DIAGNOSIS — R296 Repeated falls: Secondary | ICD-10-CM | POA: Diagnosis not present

## 2020-04-11 DIAGNOSIS — G309 Alzheimer's disease, unspecified: Secondary | ICD-10-CM | POA: Diagnosis not present

## 2020-04-11 DIAGNOSIS — I214 Non-ST elevation (NSTEMI) myocardial infarction: Secondary | ICD-10-CM | POA: Insufficient documentation

## 2020-04-11 DIAGNOSIS — Z9861 Coronary angioplasty status: Secondary | ICD-10-CM | POA: Diagnosis not present

## 2020-04-11 DIAGNOSIS — I10 Essential (primary) hypertension: Secondary | ICD-10-CM | POA: Diagnosis not present

## 2020-04-11 NOTE — ED Provider Notes (Signed)
[facility]-EMERGENCY DEPT Provider Note   CSN: [id number] Arrival date & time: 04/11/20  2259     History Chief Complaint  Patient presents with  . Fall    Eric Zamora is a 74 y.o. male.  Level 5 caveat for dementia.  Patient brought in from nursing home after witnessed fall.  He is not able to give a history.  he states he feels fine and denies any complaints.  He states he went to fall as he was getting out of his wheelchair and is not supposed to be outside of his wheelchair.  He denies any head, neck, back, chest or abdominal pain.  Discussed with facility staff Suzette BattiestYasha.  She states she saw patient stand up while getting out of his wheelchair when he fell forward and struck his head.  She states he may have lost consciousness for a few minutes and had a brief spell of shaking all over.  He did not bite his tongue or pee his pants.  She saw him fall forward and struck his head.  There is no preceding dizziness or lightheadedness.  No chest pain or shortness of breath.  No focal weakness, numbness or tingling.  She states it was a "blackout spell".  Patient appeared to be at baseline when he left.  He does have Xarelto on his medication list but is unclear if he is taking it at this time.  Medication list from his facility shows Plavix but no Xarelto  The history is provided by the patient, the EMS personnel and the nursing home. The history is limited by the condition of the patient.  Fall       Past Medical History:  Diagnosis Date  . Alzheimer disease (HCC)   . Bipolar affective disorder (HCC)   . Chronic renal insufficiency   . Dementia (HCC)   . Dyslipidemia   . HTN (hypertension)   . Obstructive cardiomyopathy [facility](HCC) Jan 2010   46mmhg gradient on TEE    Patient Active Problem List   Diagnosis Date Noted  . Pneumonia 07/08/2013  . CAD- Moderate LAD disease with moderate to severe ostial first diagonal disease by cath 07/2013 - not ammendable  to PCI. Medical therapy 07/07/2013  . Abnormal nuclear cardiac imaging test - Intermediate Risk 07/06/2013  . Problem with Foley catheter (HCC) 07/04/2013  . Adynamic ileus (HCC) 07/03/2013  . Left leg DVT (HCC) 07/03/2013  . Acute respiratory failure (HCC) 07/02/2013  . NSTEMI (non-ST elevated myocardial infarction) (HCC) 07/02/2013  . 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 HTN (hypertension) 07/02/2013  . Bipolar disorder (manic depression) (HCC) 07/02/2013  . Dyslipidemia 07/02/2013  . Acute on chronic renal insufficiency 07/02/2013  . Hypokalemia 07/02/2013  . Severe sepsis (HCC) 07/02/2013    Past Surgical History:  Procedure Laterality Date  . LEFT HEART CATHETERIZATION WITH CORONARY ANGIOGRAM N/A 07/06/2013   Procedure: LEFT HEART CATHETERIZATION WITH CORONARY ANGIOGRAM;  Surgeon: Marykay Lexavid W Harding, MD;  [facility];  Service: Cardiovascular;  Laterality: N/A;       No family history on file.  Social History   Tobacco Use  . Smoking status: Never Smoker  . Smokeless tobacco: Never Used  Vaping Use  . Vaping Use: Never used  Substance Use Topics  . Alcohol use: No  . Drug use: Not on file    Home Medications Prior to Admission medications   Medication Sig Start Date End Date Taking? Authorizing Provider  acetaminophen (TYLENOL)  500 MG tablet Take 500 mg by mouth every 4 (four) hours as needed for fever or headache.    [provider]  alum & mag hydroxide-simeth (MINTOX) 200-200-20 MG/5ML suspension Take 30 mLs by mouth as needed for indigestion or heartburn.    [provider]  aspirin 81 MG chewable tablet Chew 1 tablet (81 mg total) by mouth daily. 07/10/13   Rai, Delene Ruffini, MD  clopidogrel (PLAVIX) 75 MG tablet Take 75 mg by mouth daily.    [provider]  divalproex (DEPAKOTE SPRINKLE) 125 MG capsule Take 125 mg by mouth daily.    [provider]  ergocalciferol (VITAMIN D2) 1.25 MG  (50000 UT) capsule Take 50,000 Units by mouth every 30 (thirty) days. On the 7 th of every month.    [provider]  famotidine (PEPCID) 20 MG tablet Take 1 tablet (20 mg total) by mouth 2 (two) times daily. Patient not taking: Reported on 02/01/2020 07/10/13   Rai, Delene Ruffini, MD  furosemide (LASIX) 40 MG tablet Take 1 tablet (40 mg total) by mouth daily. 07/10/13   Rai, Delene Ruffini, MD  guaiFENesin (ROBITUSSIN) 100 MG/5ML liquid Take 200 mg by mouth 4 (four) times daily as needed for cough.    [provider]  loperamide (IMODIUM) 2 MG capsule Take 2 mg by mouth as needed for diarrhea or loose stools.    [provider]  magnesium hydroxide (MILK OF MAGNESIA) 400 MG/5ML suspension Take 30 mLs by mouth at bedtime as needed for mild constipation.    [provider]  metoprolol tartrate (LOPRESSOR) 25 MG tablet Take 1 tablet (25 mg total) by mouth 2 (two) times daily. Patient taking differently: Take 37.5 mg by mouth 2 (two) times daily.  07/10/13   Rai, Delene Ruffini, MD  Neomycin-Bacitracin-Polymyxin (TRIPLE ANTIBIOTIC) [phone] OINT Apply 1 application topically as needed (for minor skin tears or abrasions).    [provider]  PARoxetine (PAXIL) 20 MG tablet Take 20 mg by mouth daily.    [provider]  potassium chloride SA (K-DUR,KLOR-CON) 20 MEQ tablet Take 2 tablets (40 mEq total) by mouth daily. 07/10/13   Rai, Delene Ruffini, MD  Rivaroxaban (XARELTO) 15 MG TABS tablet Take 1 tablet (15 mg total) by mouth 2 (two) times daily with a meal. Till 07/27/13, then start 20mg  daily 07/10/13 07/27/13  Rai, 07/29/13, MD  Rivaroxaban (XARELTO) 20 MG TABS tablet Take 1 tablet (20 mg total) by mouth daily with supper. Start on 07/28/13, for total of 3 months Patient not taking: Reported on 04/12/2016 07/28/13   Rai, 07/30/13, MD  simvastatin (ZOCOR) 20 MG tablet Take 20 mg by mouth at bedtime.    [provider]    Allergies    Patient has  no known allergies.  Review of Systems   Review of Systems  Unable to perform ROS: Dementia    Physical Exam Updated Vital Signs BP 134/68 (BP Location: Left Arm)   Pulse 70   Temp 98.1 F (36.7 C) (Oral)   Resp 16   SpO2 99%   Physical Exam Vitals and nursing note reviewed.  Constitutional:      General: He is not in acute distress.    Appearance: He is well-developed.     Comments: Oriented to person only.  No evidence of head trauma  HENT:     Head: Normocephalic and atraumatic.     Ears:     Comments: No septal hematoma or hemotympanum  Mouth/Throat:     Pharynx: No oropharyngeal exudate.  Eyes:     Conjunctiva/sclera: Conjunctivae normal.     Pupils: Pupils are equal, round, and reactive to light.  Neck:     Comments:  No C-spine tenderness Cardiovascular:     Rate and Rhythm: Normal rate and regular rhythm.     Heart sounds: Normal heart sounds. No murmur heard.   Pulmonary:     Effort: Pulmonary effort is normal. No respiratory distress.     Breath sounds: Normal breath sounds.  Abdominal:     Palpations: Abdomen is soft.     Tenderness: There is no abdominal tenderness. There is no guarding or rebound.  Musculoskeletal:        General: No tenderness. Normal range of motion.     Cervical back: Normal range of motion and neck supple.     Comments: No T or L-spine tenderness.  Full range of motion of hips without pain bilaterally  Skin:    General: Skin is warm.  Neurological:     Mental Status: He is alert.     Cranial Nerves: No cranial nerve deficit.     Motor: No abnormal muscle tone.     Coordination: Coordination normal.     Comments: Oriented to person only.  5/5 strength throughout, cranial nerves II to XII intact, no pronator drift.  Psychiatric:        Behavior: Behavior normal.     ED Results / Procedures / Treatments   Labs (all labs ordered are listed, but only abnormal results are displayed) Labs Reviewed  CBC WITH  DIFFERENTIAL/PLATELET - Abnormal; Notable for the following components:      Result Value   RBC 3.78 (*)    Hemoglobin 11.2 (*)    HCT 34.8 (*)    All other components within normal limits  COMPREHENSIVE METABOLIC PANEL - Abnormal; Notable for the following components:   Glucose, Bld 144 (*)    BUN 26 (*)    Creatinine, Ser 1.56 (*)    Calcium 8.4 (*)    GFR calc non Af Amer 43 (*)    GFR calc Af Amer 50 (*)    All other components within normal limits  VALPROIC ACID LEVEL - Abnormal; Notable for the following components:   Valproic Acid Lvl 23 (*)    All other components within normal limits  URINALYSIS, ROUTINE W REFLEX MICROSCOPIC    EKG None  Radiology CT Head Wo Contrast  Result Date: 04/12/2020 CLINICAL DATA:  Head trauma mechanical fall from standing position EXAM: CT HEAD WITHOUT CONTRAST CT CERVICAL SPINE WITHOUT CONTRAST TECHNIQUE: Multidetector CT imaging of the head and cervical spine was performed following the standard protocol without intravenous contrast. Multiplanar CT image reconstructions of the cervical spine were also generated. COMPARISON:  CT brain and cervical spine 03/16/2020, 02/01/2020, 12/30/2018 FINDINGS: CT HEAD FINDINGS Brain: No acute territorial infarction, hemorrhage, or intracranial mass is visualized. Moderate to advanced atrophy. Stable ventricle size. Chronic lacunar infarct in the right caudate. Vascular: No hyperdense vessels.  Carotid vascular calcification Skull: Incompletely visualized chronic fracture at the left lateral mass of C1. Sinuses/Orbits: Mucosal thickening in the right maxillary and ethmoid sinuses. Other: None CT CERVICAL SPINE FINDINGS Alignment: Stable alignment with no subluxation. Skull base and vertebrae: No acute fracture is seen. There is chronic fracture deformity of left lateral mass of C1. There is absence of the right posterior arch of C1. Vertebral body heights are maintained Soft tissues and spinal canal:  No prevertebral  fluid or swelling. No visible canal hematoma. Disc levels:  Mild degenerative changes at C3-C4, C5-C6 and C6-C7. Upper chest: Negative. Other: None IMPRESSION: 1. No CT evidence for acute intracranial abnormality. Atrophy. 2. Chronic fracture deformity of the left lateral mass of C1. No acute fracture is identified. Electronically Signed   By: Jasmine Pang M.D.   On: 04/12/2020 00:49   CT Cervical Spine Wo Contrast  Result Date: 04/12/2020 CLINICAL DATA:  Head trauma mechanical fall from standing position EXAM: CT HEAD WITHOUT CONTRAST CT CERVICAL SPINE WITHOUT CONTRAST TECHNIQUE: Multidetector CT imaging of the head and cervical spine was performed following the standard protocol without intravenous contrast. Multiplanar CT image reconstructions of the cervical spine were also generated. COMPARISON:  CT brain and cervical spine 03/16/2020, 02/01/2020, 12/30/2018 FINDINGS: CT HEAD FINDINGS Brain: No acute territorial infarction, hemorrhage, or intracranial mass is visualized. Moderate to advanced atrophy. Stable ventricle size. Chronic lacunar infarct in the right caudate. Vascular: No hyperdense vessels.  Carotid vascular calcification Skull: Incompletely visualized chronic fracture at the left lateral mass of C1. Sinuses/Orbits: Mucosal thickening in the right maxillary and ethmoid sinuses. Other: None CT CERVICAL SPINE FINDINGS Alignment: Stable alignment with no subluxation. Skull base and vertebrae: No acute fracture is seen. There is chronic fracture deformity of left lateral mass of C1. There is absence of the right posterior arch of C1. Vertebral body heights are maintained Soft tissues and spinal canal: No prevertebral fluid or swelling. No visible canal hematoma. Disc levels:  Mild degenerative changes at C3-C4, C5-C6 and C6-C7. Upper chest: Negative. Other: None IMPRESSION: 1. No CT evidence for acute intracranial abnormality. Atrophy. 2. Chronic fracture deformity of the left lateral mass of C1. No  acute fracture is identified. Electronically Signed   By: Jasmine Pang M.D.   On: 04/12/2020 00:49    Procedures Procedures (including critical care time)  Medications Ordered in ED Medications - No data to display  ED Course  I have reviewed the triage vital signs and the nursing notes.  Pertinent labs & imaging results that were available during my care of the patient were reviewed by me and considered in my medical decision making (see chart for details).    MDM Rules/Calculators/A&P                         Patient from nursing home after fall with possible loss of consciousness.  Xarelto is on his medication list but does not appear that he is taking it. He is on plavix.   We will obtain CT head and C-spine.  EKG, labs, and urine EKG has stable T wave inversions.  No Brugada, no prolonged QT.  Labs reassuring. Cr is stable.  Depakote level subtherapeutic.  CT head and C-spine are negative.  Does have chronic deformity of C1 which is unchanged.  Patient appears to be at his baseline and denies any pain complaints.  He appears stable to return to his facility.  ED ECG REPORT   Date: 04/12/2020  Rate: 70  Rhythm: normal sinus rhythm  QRS Axis: normal  Intervals: normal  ST/T Wave abnormalities: nonspecific T wave changes  Conduction Disutrbances:none  Narrative Interpretation: Lateral T wave inversions unchanged  Old EKG Reviewed: unchanged  I have personally reviewed the EKG tracing and agree with the computerized printout as noted.  Final Clinical Impression(s) / ED Diagnoses Final diagnoses:  Fall, initial encounter    Rx / DC Orders ED Discharge Orders  None       Glynn Octave, MD 04/12/20 [phone]

## 2020-04-11 NOTE — ED Triage Notes (Signed)
Pt arrived via [facility] from [facility] mechanical fall from standing position. Pt denied head neck or back pain or LOC. Pt is not on blood thinners.     Hx Dementia, syncope, CHF

## 2020-04-12 DIAGNOSIS — R296 Repeated falls: Secondary | ICD-10-CM | POA: Diagnosis not present

## 2020-04-12 LAB — CBC WITH DIFFERENTIAL/PLATELET
Abs Immature Granulocytes: 0.01 10*3/uL (ref 0.00–0.07)
Basophils Absolute: 0.1 10*3/uL (ref 0.0–0.1)
Basophils Relative: 1 %
Eosinophils Absolute: 0.4 10*3/uL (ref 0.0–0.5)
Eosinophils Relative: 7 %
HCT: 34.8 % — ABNORMAL LOW (ref 39.0–52.0)
Hemoglobin: 11.2 g/dL — ABNORMAL LOW (ref 13.0–17.0)
Immature Granulocytes: 0 %
Lymphocytes Relative: 24 %
Lymphs Abs: 1.4 10*3/uL (ref 0.7–4.0)
MCH: 29.6 pg (ref 26.0–34.0)
MCHC: 32.2 g/dL (ref 30.0–36.0)
MCV: 92.1 fL (ref 80.0–100.0)
Monocytes Absolute: 0.9 10*3/uL (ref 0.1–1.0)
Monocytes Relative: 15 %
Neutro Abs: 3.1 10*3/uL (ref 1.7–7.7)
Neutrophils Relative %: 53 %
Platelets: 173 10*3/uL (ref 150–400)
RBC: 3.78 MIL/uL — ABNORMAL LOW (ref 4.22–5.81)
RDW: 13.3 % (ref 11.5–15.5)
WBC: 5.8 10*3/uL (ref 4.0–10.5)
nRBC: 0 % (ref 0.0–0.2)

## 2020-04-12 LAB — COMPREHENSIVE METABOLIC PANEL
ALT: 13 U/L (ref 0–44)
AST: 22 U/L (ref 15–41)
Albumin: 3.7 g/dL (ref 3.5–5.0)
Alkaline Phosphatase: 49 U/L (ref 38–126)
Anion gap: 7 (ref 5–15)
BUN: 26 mg/dL — ABNORMAL HIGH (ref 8–23)
CO2: 23 mmol/L (ref 22–32)
Calcium: 8.4 mg/dL — ABNORMAL LOW (ref 8.9–10.3)
Chloride: 109 mmol/L (ref 98–111)
Creatinine, Ser: 1.56 mg/dL — ABNORMAL HIGH (ref 0.61–1.24)
GFR calc Af Amer: 50 mL/min — ABNORMAL LOW (ref >60)
GFR calc non Af Amer: 43 mL/min — ABNORMAL LOW (ref >60)
Glucose, Bld: 144 mg/dL — ABNORMAL HIGH (ref 70–99)
Potassium: 4.6 mmol/L (ref 3.5–5.1)
Sodium: 139 mmol/L (ref 135–145)
Total Bilirubin: 0.4 mg/dL (ref 0.3–1.2)
Total Protein: 6.9 g/dL (ref 6.5–8.1)

## 2020-04-12 LAB — VALPROIC ACID LEVEL: Valproic Acid Lvl: 23 ug/mL — ABNORMAL LOW (ref 50.0–100.0)

## 2020-04-12 NOTE — Discharge Instructions (Addendum)
CT head and C-spine are negative for any acute traumatic pathology.  Follow-up with your doctor.  Depakote level today is slightly low at 23. Return to the ED with new or worsening symptoms.

## 2020-07-02 ENCOUNTER — Other Ambulatory Visit: Payer: Self-pay

## 2020-07-02 ENCOUNTER — Emergency Department (HOSPITAL_COMMUNITY)
Admission: EM | Admit: 2020-07-02 | Discharge: 2020-07-02 | Disposition: A | Discharge status: Home / Self Care | Payer: Medicare Other | Attending: Emergency Medicine | Admitting: Emergency Medicine

## 2020-07-02 ENCOUNTER — Encounter (HOSPITAL_COMMUNITY): Payer: Self-pay

## 2020-07-02 ENCOUNTER — Emergency Department (HOSPITAL_COMMUNITY): Payer: Medicare Other

## 2020-07-02 DIAGNOSIS — I10 Essential (primary) hypertension: Secondary | ICD-10-CM | POA: Insufficient documentation

## 2020-07-02 DIAGNOSIS — W228XXA Striking against or struck by other objects, initial encounter: Secondary | ICD-10-CM | POA: Diagnosis not present

## 2020-07-02 DIAGNOSIS — Z955 Presence of coronary angioplasty implant and graft: Secondary | ICD-10-CM | POA: Insufficient documentation

## 2020-07-02 DIAGNOSIS — S0181XA Laceration without foreign body of other part of head, initial encounter: Secondary | ICD-10-CM

## 2020-07-02 DIAGNOSIS — F0390 Unspecified dementia without behavioral disturbance: Secondary | ICD-10-CM | POA: Insufficient documentation

## 2020-07-02 DIAGNOSIS — Z79899 Other long term (current) drug therapy: Secondary | ICD-10-CM | POA: Insufficient documentation

## 2020-07-02 DIAGNOSIS — S0990XA Unspecified injury of head, initial encounter: Secondary | ICD-10-CM | POA: Diagnosis present

## 2020-07-02 DIAGNOSIS — W19XXXA Unspecified fall, initial encounter: Secondary | ICD-10-CM

## 2020-07-02 DIAGNOSIS — Z7982 Long term (current) use of aspirin: Secondary | ICD-10-CM | POA: Diagnosis not present

## 2020-07-02 DIAGNOSIS — Z23 Encounter for immunization: Secondary | ICD-10-CM | POA: Diagnosis not present

## 2020-07-02 DIAGNOSIS — Z7901 Long term (current) use of anticoagulants: Secondary | ICD-10-CM | POA: Insufficient documentation

## 2020-07-02 DIAGNOSIS — I2510 Atherosclerotic heart disease of native coronary artery without angina pectoris: Secondary | ICD-10-CM | POA: Diagnosis not present

## 2020-07-02 LAB — CBC WITH DIFFERENTIAL/PLATELET
Abs Immature Granulocytes: 0.03 10*3/uL (ref 0.00–0.07)
Basophils Absolute: 0.1 10*3/uL (ref 0.0–0.1)
Basophils Relative: 1 %
Eosinophils Absolute: 0.2 10*3/uL (ref 0.0–0.5)
Eosinophils Relative: 3 %
HCT: 35.1 % — ABNORMAL LOW (ref 39.0–52.0)
Hemoglobin: 11.6 g/dL — ABNORMAL LOW (ref 13.0–17.0)
Immature Granulocytes: 0 %
Lymphocytes Relative: 19 %
Lymphs Abs: 1.5 10*3/uL (ref 0.7–4.0)
MCH: 29.6 pg (ref 26.0–34.0)
MCHC: 33 g/dL (ref 30.0–36.0)
MCV: 89.5 fL (ref 80.0–100.0)
Monocytes Absolute: 1.1 10*3/uL — ABNORMAL HIGH (ref 0.1–1.0)
Monocytes Relative: 14 %
Neutro Abs: 4.9 10*3/uL (ref 1.7–7.7)
Neutrophils Relative %: 63 %
Platelets: 209 10*3/uL (ref 150–400)
RBC: 3.92 MIL/uL — ABNORMAL LOW (ref 4.22–5.81)
RDW: 13.2 % (ref 11.5–15.5)
WBC: 7.8 10*3/uL (ref 4.0–10.5)
nRBC: 0 % (ref 0.0–0.2)

## 2020-07-02 LAB — COMPREHENSIVE METABOLIC PANEL
ALT: 20 U/L (ref 0–44)
AST: 26 U/L (ref 15–41)
Albumin: 3.8 g/dL (ref 3.5–5.0)
Alkaline Phosphatase: 43 U/L (ref 38–126)
Anion gap: 8 (ref 5–15)
BUN: 26 mg/dL — ABNORMAL HIGH (ref 8–23)
CO2: 25 mmol/L (ref 22–32)
Calcium: 8.5 mg/dL — ABNORMAL LOW (ref 8.9–10.3)
Chloride: 107 mmol/L (ref 98–111)
Creatinine, Ser: 1.68 mg/dL — ABNORMAL HIGH (ref 0.61–1.24)
GFR, Estimated: 42 mL/min — ABNORMAL LOW (ref >60)
Glucose, Bld: 143 mg/dL — ABNORMAL HIGH (ref 70–99)
Potassium: 4.5 mmol/L (ref 3.5–5.1)
Sodium: 140 mmol/L (ref 135–145)
Total Bilirubin: 0.3 mg/dL (ref 0.3–1.2)
Total Protein: 7.3 g/dL (ref 6.5–8.1)

## 2020-07-02 LAB — PROTIME-INR
INR: 1 (ref 0.8–1.2)
Prothrombin Time: 12.8 seconds (ref 11.4–15.2)

## 2020-07-02 MED ORDER — TETANUS-DIPHTH-ACELL PERTUSSIS 5-2.5-18.5 LF-MCG/0.5 IM SUSY
0.5000 mL | PREFILLED_SYRINGE | Freq: Once | INTRAMUSCULAR | Status: AC
  Administered 2020-07-02: 0.5 mL via INTRAMUSCULAR
  Filled 2020-07-02: qty 0.5

## 2020-07-02 MED ORDER — LIDOCAINE HCL (PF) 1 % IJ SOLN
5.0000 mL | Freq: Once | INTRAMUSCULAR | Status: AC
  Administered 2020-07-02: 5 mL
  Filled 2020-07-02: qty 30

## 2020-07-02 MED ORDER — BACITRACIN ZINC 500 UNIT/GM EX OINT
TOPICAL_OINTMENT | Freq: Two times a day (BID) | CUTANEOUS | Status: DC
  Filled 2020-07-02: qty 0.9

## 2020-07-02 NOTE — ED Notes (Signed)
[facility] called for transport back to [facility].  

## 2020-07-02 NOTE — ED Provider Notes (Addendum)
[facility]-EMERGENCY DEPT Provider Note   CSN: [id number] Arrival date & time: 07/02/20  1343     History Chief Complaint  Patient presents with  . Fall    Eric Zamora is a 74 y.o. male.  HPI Patient is a 74 year old male with history of Alzheimer's disease, bipolar affective disorder, renal insufficiency, dyslipidemia, hypertension, who presents to the emergency department due to a fall.  Patient was brought in via EMS from [facility].  They state the patient fell and hit his head on the tile floor.  Patient confirms this.  Patient states that he normally uses a wheelchair but did not have 1 and when ambulating, tripped and fell.  He is A&O x3 and has no complaints at this time.  Does note some mild pain and a laceration to the forehead but otherwise denies any other symptoms.  Bleeding controlled.  No chest pain, shortness of breath, dizziness, weakness.  Per nursing staff at [facility], pt was walking, passed out and struck his forehead "hard" on the floor. Per facility, he is not on Xarelto. He takes Plavix. Felt that patient was unconscious for a few seconds. Patient was using a wheelchair in the past but is typically ambulatory without any assistance, per staff.      Past Medical History:  Diagnosis Date  . Alzheimer disease (HCC)   . Bipolar affective disorder (HCC)   . Chronic renal insufficiency   . Dementia (HCC)   . Dyslipidemia   . HTN (hypertension)   . Obstructive cardiomyopathy [facility]) Jan 2010   gradient on TEE    Patient Active Problem List   Diagnosis Date Noted  . Pneumonia 07/08/2013  . CAD- Moderate LAD disease with moderate to severe ostial first diagonal disease by cath 07/2013 - not ammendable to PCI. Medical therapy 07/07/2013  . Abnormal nuclear cardiac imaging test - Intermediate Risk 07/06/2013  . Problem with Foley catheter (HCC) 07/04/2013  . Adynamic ileus (HCC) 07/03/2013  . Left leg DVT (HCC)  07/03/2013  . Acute respiratory failure (HCC) 07/02/2013  . NSTEMI (non-ST elevated myocardial infarction) (HCC) 07/02/2013  . 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 HTN (hypertension) 07/02/2013  . Bipolar disorder (manic depression) (HCC) 07/02/2013  . Dyslipidemia 07/02/2013  . Acute on chronic renal insufficiency 07/02/2013  . Hypokalemia 07/02/2013  . Severe sepsis (HCC) 07/02/2013    Past Surgical History:  Procedure Laterality Date  . LEFT HEART CATHETERIZATION WITH CORONARY ANGIOGRAM N/A 07/06/2013   Procedure: LEFT HEART CATHETERIZATION WITH CORONARY ANGIOGRAM;  Surgeon: Marykay Lex, MD;  Location: [facility] CATH LAB;  Service: Cardiovascular;  Laterality: N/A;       History reviewed. No pertinent family history.  Social History   Tobacco Use  . Smoking status: Never Smoker  . Smokeless tobacco: Never Used  Vaping Use  . Vaping Use: Never used  Substance Use Topics  . Alcohol use: No  . Drug use: Not on file    Home Medications Prior to Admission medications   Medication Sig Start Date End Date Taking? Authorizing Provider  acetaminophen (TYLENOL) 500 MG tablet Take 500 mg by mouth every 4 (four) hours as needed for fever or headache.    [provider]  alum & mag hydroxide-simeth (MINTOX) 200-200-20 MG/5ML suspension Take 30 mLs by mouth as needed for indigestion or heartburn.    [provider]  aspirin 81 MG chewable tablet Chew 1 tablet (81 mg total) by mouth daily.  07/10/13   Rai, Delene Ruffiniipudeep K, MD  clopidogrel (PLAVIX) 75 MG tablet Take 75 mg by mouth daily.    [provider]  divalproex (DEPAKOTE SPRINKLE) 125 MG capsule Take 125 mg by mouth daily.    [provider]  ergocalciferol (VITAMIN D2) 1.25 MG (50000 UT) capsule Take 50,000 Units by mouth every 30 (thirty) days. On the 7 th of every month.    [provider]  famotidine (PEPCID) 20 MG tablet Take 1 tablet (20 mg total) by mouth  2 (two) times daily. Patient not taking: Reported on 02/01/2020 07/10/13   Rai, Delene Ruffiniipudeep K, MD  furosemide (LASIX) 40 MG tablet Take 1 tablet (40 mg total) by mouth daily. 07/10/13   Rai, Delene Ruffiniipudeep K, MD  guaiFENesin (ROBITUSSIN) 100 MG/5ML liquid Take 200 mg by mouth 4 (four) times daily as needed for cough.    [provider]  loperamide (IMODIUM) 2 MG capsule Take 2 mg by mouth as needed for diarrhea or loose stools.    [provider]  magnesium hydroxide (MILK OF MAGNESIA) 400 MG/5ML suspension Take 30 mLs by mouth at bedtime as needed for mild constipation.    [provider]  metoprolol tartrate (LOPRESSOR) 25 MG tablet Take 1 tablet (25 mg total) by mouth 2 (two) times daily. Patient taking differently: Take 37.5 mg by mouth 2 (two) times daily.  07/10/13   Rai, Delene Ruffiniipudeep K, MD  Neomycin-Bacitracin-Polymyxin (TRIPLE ANTIBIOTIC) 3.5-3092064426 OINT Apply 1 application topically as needed (for minor skin tears or abrasions).    [provider]  PARoxetine (PAXIL) 20 MG tablet Take 20 mg by mouth daily.    [provider]  potassium chloride SA (K-DUR,KLOR-CON) 20 MEQ tablet Take 2 tablets (40 mEq total) by mouth daily. 07/10/13   Rai, Delene Ruffiniipudeep K, MD  Rivaroxaban (XARELTO) 15 MG TABS tablet Take 1 tablet (15 mg total) by mouth 2 (two) times daily with a meal. Till 07/27/13, then start 20mg  daily 07/10/13 07/27/13  Rai, Delene Ruffiniipudeep K, MD  Rivaroxaban (XARELTO) 20 MG TABS tablet Take 1 tablet (20 mg total) by mouth daily with supper. Start on 07/28/13, for total of 3 months Patient not taking: Reported on 04/12/2016 07/28/13   Rai, Delene Ruffiniipudeep K, MD  simvastatin (ZOCOR) 20 MG tablet Take 20 mg by mouth at bedtime.    [provider]    Allergies    Patient has no known allergies.  Review of Systems   Review of Systems  All other systems reviewed and are negative. Ten systems reviewed and are negative for acute change, except as noted in the HPI.     Physical Exam Updated Vital Signs BP 134/73 (BP Location: Right Arm)   Pulse 84   Temp 99.4 F (37.4 C) (Oral)   Resp 16   SpO2 98%   Physical Exam Vitals and nursing note reviewed.  Constitutional:      General: He is not in acute distress.    Appearance: Normal appearance. He is not ill-appearing, toxic-appearing or diaphoretic.  HENT:     Head: Normocephalic.     Comments: 3 cm well approximated laceration with controlled bleeding noted to the forehead.    Right Ear: External ear normal.     Left Ear: External ear normal.     Nose: Nose normal.     Mouth/Throat:     Mouth: Mucous membranes are moist.     Pharynx: Oropharynx is clear. No oropharyngeal exudate or posterior oropharyngeal erythema.  Eyes:  Extraocular Movements: Extraocular movements intact.  Cardiovascular:     Rate and Rhythm: Normal rate and regular rhythm.     Pulses: Normal pulses.     Heart sounds: Normal heart sounds. No murmur heard.  No friction rub. No gallop.   Pulmonary:     Effort: Pulmonary effort is normal. No respiratory distress.     Breath sounds: Normal breath sounds. No stridor. No wheezing, rhonchi or rales.  Abdominal:     General: Abdomen is flat.     Tenderness: There is no abdominal tenderness.  Musculoskeletal:        General: Normal range of motion.     Cervical back: Normal range of motion and neck supple. No tenderness.     Right lower leg: No edema.     Left lower leg: No edema.  Skin:    General: Skin is warm and dry.  Neurological:     General: No focal deficit present.     Mental Status: He is alert and oriented to person, place, and time.     Comments: Patient is A&O x3.  At times is difficult to understand but seems to answer questions appropriately.  Moving all 4 extremities without difficulty.  Strength is 5 out of 5 in all 4 extremities.  Distal sensation intact.  Psychiatric:        Mood and Affect: Mood normal.        Behavior: Behavior normal.    ED  Results / Procedures / Treatments   Labs (all labs ordered are listed, but only abnormal results are displayed) Labs Reviewed  COMPREHENSIVE METABOLIC PANEL - Abnormal; Notable for the following components:      Result Value   Glucose, Bld 143 (*)    BUN 26 (*)    Creatinine, Ser 1.68 (*)    Calcium 8.5 (*)    GFR, Estimated 42 (*)    All other components within normal limits  CBC WITH DIFFERENTIAL/PLATELET - Abnormal; Notable for the following components:   RBC 3.92 (*)    Hemoglobin 11.6 (*)    HCT 35.1 (*)    Monocytes Absolute 1.1 (*)    All other components within normal limits  PROTIME-INR   EKG None  Radiology CT Head Wo Contrast  Result Date: 07/02/2020 CLINICAL DATA:  Head trauma, moderate/severe. Additional history provided: Fall, hitting head, laceration to forehead. EXAM: CT HEAD WITHOUT CONTRAST TECHNIQUE: Contiguous axial images were obtained from the base of the skull through the vertex without intravenous contrast. COMPARISON:  Head CT 04/11/2020. FINDINGS: Brain: Mild generalized cerebral atrophy. Redemonstrated chronic lacunar infarct within the right caudate nucleus (series 3, image 16). Redemonstrated small chronic infarct within the superior left cerebellar hemisphere. There is no acute intracranial hemorrhage. No demarcated cortical infarct. No extra-axial fluid collection. No evidence of intracranial mass. No midline shift. Vascular: No hyperdense vessel.  Atherosclerotic calcifications. Skull: Normal. Negative for fracture or focal lesion. Sinuses/Orbits: Visualized orbits show no acute finding. Mild scattered paranasal sinus mucosal thickening at the imaged levels. Other: No significant mastoid effusion. Frontal scalp/forehead hematoma and laceration. Redemonstrated chronic fracture deformity of the C1 left lateral mass. IMPRESSION: No evidence of acute intracranial abnormality. Frontal scalp/forehead hematoma and laceration. Mild cerebral atrophy. 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 Electronically Signed   By: Jackey Loge DO   On: 07/02/2020 16:24   CT Cervical Spine Wo Contrast  Result Date: 07/02/2020 CLINICAL DATA:  Larey Seat and hit head, facial  trauma, dementia EXAM: CT CERVICAL SPINE WITHOUT CONTRAST TECHNIQUE: Multidetector CT imaging of the cervical spine was performed without intravenous contrast. Multiplanar CT image reconstructions were also generated. COMPARISON:  04/11/2020 FINDINGS: Alignment: Alignment is anatomic. Skull base and vertebrae: Stable chronic fracture through the left lateral mass of C1. No acute cervical spine fractures. Soft tissues and spinal canal: No prevertebral fluid or swelling. No visible canal hematoma. Disc levels: No significant spondylosis or facet hypertrophy. Neural foramina are patent. Upper chest: Airway is patent.  Lung apices are clear. Other: Reconstructed images demonstrate no additional findings. IMPRESSION: 1. Stable chronic fracture left lateral mass of C1. 2. No acute cervical spine fracture. Electronically Signed   By: Sharlet Salina M.D.   On: 07/02/2020 16:19   Procedures .Marland KitchenLaceration Repair  Date/Time: 07/02/2020 7:00 PM Performed by: Placido Sou, PA-C Authorized by: Placido Sou, PA-C   Consent:    Consent obtained:  Verbal   Consent given by:  Patient Anesthesia (see MAR for exact dosages):    Anesthesia method:  Local infiltration   Local anesthetic:  Lidocaine 1% w/o epi Laceration details:    Location:  Face   Face location:  Forehead   Length (cm):  3 Repair type:    Repair type:  Intermediate Pre-procedure details:    Preparation:  Patient was prepped and draped in usual sterile fashion Exploration:    Contaminated: no   Treatment:    Area cleansed with:  Betadine   Amount of cleaning:  Standard Skin repair:    Repair method:  Sutures   Suture size:  6-0   Suture material:  Prolene   Suture technique:  Simple  interrupted   Number of sutures:  5 Approximation:    Approximation:  Close Post-procedure details:    Dressing:  Antibiotic ointment   Patient tolerance of procedure:  Tolerated well, no immediate complications   Medications Ordered in ED Medications  bacitracin ointment ( Topical Given 07/02/20 1914)  Tdap (BOOSTRIX) injection 0.5 mL (0.5 mLs Intramuscular Given 07/02/20 1913)  lidocaine (PF) (XYLOCAINE) 1 % injection 5 mL (5 mLs Infiltration Given by Other 07/02/20 1846)   ED Course  I have reviewed the triage vital signs and the nursing notes.  Pertinent labs & imaging results that were available during my care of the patient were reviewed by me and considered in my medical decision making (see chart for details).  Clinical Course as of Jul 02 1942  Tue Jul 02, 2020  1609 Similar to baseline values.   Hemoglobin(!): 11.6 [LJ]  1717 Similar to baseline values.   Creatinine(!): 1.68 [LJ]    Clinical Course User Index [LJ] Placido Sou, PA-C   MDM Rules/Calculators/A&P                          Patient is a 74 year old male who presents to the emergency department due to a fall that occurred prior to arrival.  Staff was concerned patient had a syncopal episode before hitting the floor.  He had a small laceration noted to the forehead which I cleaned and closed with 6-0 Prolene sutures.  Patient tolerated the procedure well.  Tdap updated and bacitracin applied to the wound.  CT scan obtained of the head and neck with findings as noted above which were reassuring.  These were independently reviewed and interpreted myself.  Basic labs were obtained and are reassuring.  Values appear to be similar to patient's baseline.  Patient states he is  eager to be discharged at this time.  I have the nursing staff put bacitracin on his wound and ambulate him throughout the emergency department.  Patient was able to ambulate with the nursing staff without difficulty.   Patient has been  evaluated for multiple falls recently.  Staff at his nursing facility feel that he is falling more frequently.  It appears that he used to use a wheelchair but does not as of recently.  He was discussed with and evaluated by my attending physician Dr. Tilden Fossa.  Feel that given the frequency of his falls that an outpatient neurological referral is reasonable.  This was provided.  Patient slightly hypertensive near the end of his visit but otherwise his vital signs are stable.  Feel that he is safe for discharge at this time with strict return precautions.   Final Clinical Impression(s) / ED Diagnoses Final diagnoses:  Fall, initial encounter  Injury of head, initial encounter   Rx / DC Orders ED Discharge Orders    None       Placido Sou, PA-C 07/02/20 1946    Placido Sou, PA-C 07/02/20 1947    Tilden Fossa, MD 07/03/20 0007

## 2020-07-02 NOTE — Discharge Instructions (Addendum)
Please continue to put bacitracin or triple antibiotic to Eric Zamora's head, 1-2 times per day.  If he develops any new or worsening symptoms please have him return to the emergency department.  The stitches on his forehead need to be removed in 5 to 7 days.  Given the amount of falls that he has had, I feel that he needs to be evaluated by a neurologist.  He has been given a referral to a neurologist in the area.  Their contact information is below.  Please help him schedule an appointment for follow-up.

## 2020-07-02 NOTE — ED Triage Notes (Signed)
Pt BIB EMS from [facility]. Pt fell and hit his head. Pt now has a lac to forehead. Pt reports he is normally in a wheelchair, and was not when he fell. A&O to baseline. Pt has dementia.

## 2020-07-02 NOTE — ED Notes (Signed)
Pt ambulated down the hall without difficulty with two person assist.

## 2020-08-06 ENCOUNTER — Encounter (HOSPITAL_COMMUNITY): Payer: Self-pay

## 2020-08-06 ENCOUNTER — Emergency Department (HOSPITAL_COMMUNITY): Payer: Medicare Other

## 2020-08-06 ENCOUNTER — Emergency Department (HOSPITAL_COMMUNITY)
Admission: EM | Admit: 2020-08-06 | Discharge: 2020-08-07 | Disposition: A | Discharge status: Home / Self Care | Payer: Medicare Other | Attending: Emergency Medicine | Admitting: Emergency Medicine

## 2020-08-06 DIAGNOSIS — F0390 Unspecified dementia without behavioral disturbance: Secondary | ICD-10-CM | POA: Insufficient documentation

## 2020-08-06 DIAGNOSIS — Z7982 Long term (current) use of aspirin: Secondary | ICD-10-CM | POA: Diagnosis not present

## 2020-08-06 DIAGNOSIS — Z79899 Other long term (current) drug therapy: Secondary | ICD-10-CM | POA: Diagnosis not present

## 2020-08-06 DIAGNOSIS — I10 Essential (primary) hypertension: Secondary | ICD-10-CM | POA: Diagnosis not present

## 2020-08-06 DIAGNOSIS — I2510 Atherosclerotic heart disease of native coronary artery without angina pectoris: Secondary | ICD-10-CM | POA: Insufficient documentation

## 2020-08-06 DIAGNOSIS — W19XXXA Unspecified fall, initial encounter: Secondary | ICD-10-CM | POA: Insufficient documentation

## 2020-08-06 DIAGNOSIS — S0990XA Unspecified injury of head, initial encounter: Secondary | ICD-10-CM | POA: Insufficient documentation

## 2020-08-06 DIAGNOSIS — Z7901 Long term (current) use of anticoagulants: Secondary | ICD-10-CM | POA: Diagnosis not present

## 2020-08-06 LAB — CBC WITH DIFFERENTIAL/PLATELET
Abs Immature Granulocytes: 0.01 10*3/uL (ref 0.00–0.07)
Basophils Absolute: 0.1 10*3/uL (ref 0.0–0.1)
Basophils Relative: 2 %
Eosinophils Absolute: 0.4 10*3/uL (ref 0.0–0.5)
Eosinophils Relative: 7 %
HCT: 34 % — ABNORMAL LOW (ref 39.0–52.0)
Hemoglobin: 11.3 g/dL — ABNORMAL LOW (ref 13.0–17.0)
Immature Granulocytes: 0 %
Lymphocytes Relative: 29 %
Lymphs Abs: 1.8 10*3/uL (ref 0.7–4.0)
MCH: 30.1 pg (ref 26.0–34.0)
MCHC: 33.2 g/dL (ref 30.0–36.0)
MCV: 90.4 fL (ref 80.0–100.0)
Monocytes Absolute: 0.9 10*3/uL (ref 0.1–1.0)
Monocytes Relative: 14 %
Neutro Abs: 3 10*3/uL (ref 1.7–7.7)
Neutrophils Relative %: 48 %
Platelets: 176 10*3/uL (ref 150–400)
RBC: 3.76 MIL/uL — ABNORMAL LOW (ref 4.22–5.81)
RDW: 13.5 % (ref 11.5–15.5)
WBC: 6.2 10*3/uL (ref 4.0–10.5)
nRBC: 0 % (ref 0.0–0.2)

## 2020-08-06 LAB — URINALYSIS, ROUTINE W REFLEX MICROSCOPIC
Bilirubin Urine: NEGATIVE
Glucose, UA: 50 mg/dL — AB
Hgb urine dipstick: NEGATIVE
Ketones, ur: NEGATIVE mg/dL
Leukocytes,Ua: NEGATIVE
Nitrite: NEGATIVE
Protein, ur: NEGATIVE mg/dL
Specific Gravity, Urine: 1.013 (ref 1.005–1.030)
pH: 6 (ref 5.0–8.0)

## 2020-08-06 LAB — BASIC METABOLIC PANEL
Anion gap: 10 (ref 5–15)
BUN: 26 mg/dL — ABNORMAL HIGH (ref 8–23)
CO2: 23 mmol/L (ref 22–32)
Calcium: 8.2 mg/dL — ABNORMAL LOW (ref 8.9–10.3)
Chloride: 105 mmol/L (ref 98–111)
Creatinine, Ser: 1.72 mg/dL — ABNORMAL HIGH (ref 0.61–1.24)
GFR, Estimated: 41 mL/min — ABNORMAL LOW (ref >60)
Glucose, Bld: 159 mg/dL — ABNORMAL HIGH (ref 70–99)
Potassium: 4.3 mmol/L (ref 3.5–5.1)
Sodium: 138 mmol/L (ref 135–145)

## 2020-08-06 NOTE — ED Provider Notes (Signed)
[facility]-EMERGENCY DEPT Provider Note   CSN: [id number] Arrival date & time: 08/06/20  1910     History Chief Complaint  Patient presents with  . Fall    pt. senf from SNF for fall. pt has some swelling to forhead. pt dneis any pain or injury     Eric Zamora is a 74 y.o. male with a past medical history significant for Alzheimer's disease, bipolar affective disorder, renal insufficiency, dyslipidemia, hypertension who presents to the ED via EMS due to a fall that occurred just prior to arrival.  Patient has a history of recurrent falls.  Patient is a resident at [facility].  During my initial evaluation, patient states he is in no pain and has no physical complaints but was sent here due to a fall.  Chart reviewed.  Patient has visited the ED numerous times after falls. Patient denies pain, shortness of breath, dizziness, speech changes, visual changes, unilateral weakness.  No treatment prior to arrival.  Spoke to [place] at [facility] who notes that patient became "stiff" and fell directly on his forehead.  She admits to a 5-second loss of consciousness.  Adela Lank notes that patient was at baseline prior to transport to the ED.  Per chart review, patient is on Xarelto; however, Nida Boatman denies this and it is not on his paperwork from SNF. Patient currently takes ASA and Plavix.   History obtained from patient, SNF worker,  and past medical records. No interpreter used during encounter.      Past Medical History:  Diagnosis Date  . Alzheimer disease (HCC)   . Bipolar affective disorder (HCC)   . Chronic renal insufficiency   . Dementia (HCC)   . Dyslipidemia   . HTN (hypertension)   . Obstructive cardiomyopathy [facility]) Jan 2010   gradient on TEE    Patient Active Problem List   Diagnosis Date Noted  . Pneumonia 07/08/2013  . CAD- Moderate LAD disease with moderate to severe ostial first diagonal disease by cath 07/2013 - not  ammendable to PCI. Medical therapy 07/07/2013  . Abnormal nuclear cardiac imaging test - Intermediate Risk 07/06/2013  . Problem with Foley catheter (HCC) 07/04/2013  . Adynamic ileus (HCC) 07/03/2013  . Left leg DVT (HCC) 07/03/2013  . Acute respiratory failure (HCC) 07/02/2013  . NSTEMI (non-ST elevated myocardial infarction) (HCC) 07/02/2013  . 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 HTN (hypertension) 07/02/2013  . Bipolar disorder (manic depression) (HCC) 07/02/2013  . Dyslipidemia 07/02/2013  . Acute on chronic renal insufficiency 07/02/2013  . Hypokalemia 07/02/2013  . Severe sepsis (HCC) 07/02/2013    Past Surgical History:  Procedure Laterality Date  . LEFT HEART CATHETERIZATION WITH CORONARY ANGIOGRAM N/A 07/06/2013   Procedure: LEFT HEART CATHETERIZATION WITH CORONARY ANGIOGRAM;  Surgeon: Marykay Lex, MD;  Location: [facility] CATH LAB;  Service: Cardiovascular;  Laterality: N/A;       History reviewed. No pertinent family history.  Social History   Tobacco Use  . Smoking status: Never Smoker  . Smokeless tobacco: Never Used  Vaping Use  . Vaping Use: Never used  Substance Use Topics  . Alcohol use: No  . Drug use: Not on file    Home Medications Prior to Admission medications   Medication Sig Start Date End Date Taking? Authorizing Provider  acetaminophen (TYLENOL) 500 MG tablet Take 500 mg by mouth every 4 (four) hours as needed for fever or headache.    [provider]  alum & mag hydroxide-simeth (MINTOX) 200-200-20 MG/5ML suspension Take 30 mLs by mouth as needed for indigestion or heartburn.    [provider]  aspirin 81 MG chewable tablet Chew 1 tablet (81 mg total) by mouth daily. 07/10/13   Rai, Delene Ruffini, MD  clopidogrel (PLAVIX) 75 MG tablet Take 75 mg by mouth daily.    [provider]  divalproex (DEPAKOTE SPRINKLE) 125 MG capsule Take 125 mg by mouth daily.    [provider]   ergocalciferol (VITAMIN D2) 1.25 MG (50000 UT) capsule Take 50,000 Units by mouth every 30 (thirty) days. On the 7 th of every month.    [provider]  famotidine (PEPCID) 20 MG tablet Take 1 tablet (20 mg total) by mouth 2 (two) times daily. Patient not taking: Reported on 02/01/2020 07/10/13   Rai, Delene Ruffini, MD  furosemide (LASIX) 40 MG tablet Take 1 tablet (40 mg total) by mouth daily. 07/10/13   Rai, Delene Ruffini, MD  guaiFENesin (ROBITUSSIN) 100 MG/5ML liquid Take 200 mg by mouth 4 (four) times daily as needed for cough.    [provider]  loperamide (IMODIUM) 2 MG capsule Take 2 mg by mouth as needed for diarrhea or loose stools.    [provider]  magnesium hydroxide (MILK OF MAGNESIA) 400 MG/5ML suspension Take 30 mLs by mouth at bedtime as needed for mild constipation.    [provider]  metoprolol tartrate (LOPRESSOR) 25 MG tablet Take 1 tablet (25 mg total) by mouth 2 (two) times daily. Patient taking differently: Take 37.5 mg by mouth 2 (two) times daily.  07/10/13   Rai, Delene Ruffini, MD  Neomycin-Bacitracin-Polymyxin (TRIPLE ANTIBIOTIC) [phone] OINT Apply 1 application topically as needed (for minor skin tears or abrasions).    [provider]  PARoxetine (PAXIL) 20 MG tablet Take 20 mg by mouth daily.    [provider]  potassium chloride SA (K-DUR,KLOR-CON) 20 MEQ tablet Take 2 tablets (40 mEq total) by mouth daily. 07/10/13   Rai, Delene Ruffini, MD  Rivaroxaban (XARELTO) 15 MG TABS tablet Take 1 tablet (15 mg total) by mouth 2 (two) times daily with a meal. Till 07/27/13, then start 20mg  daily 07/10/13 07/27/13  Rai, 07/29/13, MD  Rivaroxaban (XARELTO) 20 MG TABS tablet Take 1 tablet (20 mg total) by mouth daily with supper. Start on 07/28/13, for total of 3 months Patient not taking: Reported on 04/12/2016 07/28/13   Rai, 07/30/13, MD  simvastatin (ZOCOR) 20 MG tablet Take 20 mg by mouth at bedtime.    [provider]    Allergies    Patient has no known allergies.  Review of Systems   Review of Systems  Constitutional: Negative for chills and fever.  Eyes: Negative for visual disturbance.  Respiratory: Negative for shortness of breath.   Cardiovascular: Negative for chest pain.  Gastrointestinal: Negative for abdominal pain.  Genitourinary: Negative for dysuria.  Musculoskeletal: Negative for back pain, myalgias and neck pain.  Neurological: Negative for dizziness, facial asymmetry, speech difficulty, weakness, light-headedness, numbness and headaches.  All other systems reviewed and are negative.   Physical Exam Updated Vital Signs BP 125/77   Pulse (!) 58   Temp 98.2 F (36.8 C) (Oral)   Resp 13   Ht 5\' 5"  (1.651 m)   Wt 83 kg   SpO2 98%   BMI 30.45 kg/m   Physical Exam Vitals and nursing note reviewed.  Constitutional:      General: He  is not in acute distress.    Appearance: He is not toxic-appearing.  HENT:     Head: Normocephalic.     Comments: No hemotympanum, raccoon eyes, battle sign, or septal hematoma. Eyes:     Pupils: Pupils are equal, round, and reactive to light.  Neck:     Comments: No cervical midline tenderness. Cardiovascular:     Rate and Rhythm: Normal rate and regular rhythm.     Pulses: Normal pulses.     Heart sounds: Normal heart sounds. No murmur heard.  No friction rub. No gallop.   Pulmonary:     Effort: Pulmonary effort is normal.     Breath sounds: Normal breath sounds.  Abdominal:     General: Abdomen is flat. There is no distension.     Palpations: Abdomen is soft.     Tenderness: There is no abdominal tenderness. There is no guarding or rebound.  Musculoskeletal:     Cervical back: Neck supple.     Comments: No thoracic or lumbar midline tenderness.  Skin:    General: Skin is warm and dry.  Neurological:     General: No focal deficit present.     Mental Status: He is alert.     Comments: Able to follow commands Speech  difficult to understand at times which facility notes is his baseline CN III-XII intact Normal strength in upper and lower extremities bilaterally including dorsiflexion and plantar flexion, strong and equal grip strength Sensation grossly intact throughout Moves extremities without ataxia, coordination intact No pronator drift      ED Results / Procedures / Treatments   Labs (all labs ordered are listed, but only abnormal results are displayed) Labs Reviewed  CBC WITH DIFFERENTIAL/PLATELET - Abnormal; Notable for the following components:      Result Value   RBC 3.76 (*)    Hemoglobin 11.3 (*)    HCT 34.0 (*)    All other components within normal limits  BASIC METABOLIC PANEL - Abnormal; Notable for the following components:   Glucose, Bld 159 (*)    BUN 26 (*)    Creatinine, Ser 1.72 (*)    Calcium 8.2 (*)    GFR, Estimated 41 (*)    All other components within normal limits  URINALYSIS, ROUTINE W REFLEX MICROSCOPIC - Abnormal; Notable for the following components:   Glucose, UA 50 (*)    All other components within normal limits    EKG EKG Interpretation  Date/Time:  Tuesday August 06 2020 20:49:37 EST Ventricular Rate:  76 PR Interval:    QRS Duration: 96 QT Interval:  403 QTC Calculation: 454 R Axis:   82 Text Interpretation: Sinus rhythm Prolonged PR interval Borderline right axis deviation Probable LVH with secondary repol abnrm Confirmed by Cherlynn Perches ([id number]) on 08/06/2020 9:00:34 PM   Radiology CT Head Wo Contrast  Result Date: 08/06/2020 CLINICAL DATA:  [facility], some swelling to forehead, denies loss of consciousness and injury EXAM: CT HEAD WITHOUT CONTRAST CT CERVICAL SPINE WITHOUT CONTRAST TECHNIQUE: Multidetector CT imaging of the head and cervical spine was performed following the standard protocol without intravenous contrast. Multiplanar CT image reconstructions of the cervical spine were also generated. COMPARISON:   07/02/2020 FINDINGS: CT HEAD FINDINGS Brain: Generalized atrophy. Normal ventricular morphology. No midline shift or mass effect. Tiny old lacunar infarcts at superior LEFT cerebellum and RIGHT caudate head. No intracranial hemorrhage, mass lesion, or evidence of acute infarction. No extra-axial fluid collections. Vascular: Atherosclerotic calcification  of internal carotid and LEFT vertebral arteries at skull base Skull: Skull intact. Minimal LEFT frontal scalp soft tissue swelling Sinuses/Orbits: Clear Other: N/A CT CERVICAL SPINE FINDINGS Alignment: Normal Skull base and vertebrae: Osseous demineralization. Mild dextroconvex cervicothoracic scoliosis. Minimal disc space narrowing and endplate spur formation. Minimal scattered facet degenerative changes. Vertebral body heights maintained without fracture or bone destruction. 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 No acute fracture or subluxation. Soft tissues and spinal canal: Prevertebral soft tissues normal thickness. Regional cervical soft tissues otherwise unremarkable. Disc levels:  No specific abnormalities. Upper chest: Lung apices clear Other: N/A IMPRESSION: Generalized atrophy. Tiny old lacunar infarcts at LEFT cerebellum and RIGHT caudate head. No acute intracranial abnormalities. Mild degenerative disc and facet disease changes of the cervical spine. 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 No acute cervical spine abnormalities. Electronically Signed   By: Ulyses SouthwardMark  Boles M.D.   On: 08/06/2020 21:02   CT Cervical Spine Wo Contrast  Result Date: 08/06/2020 CLINICAL DATA:  [facility], some swelling to forehead, denies loss of consciousness and injury EXAM: CT HEAD WITHOUT CONTRAST CT CERVICAL SPINE WITHOUT CONTRAST TECHNIQUE: Multidetector CT imaging of the head and cervical spine was performed  following the standard protocol without intravenous contrast. Multiplanar CT image reconstructions of the cervical spine were also generated. COMPARISON:  07/02/2020 FINDINGS: CT HEAD FINDINGS Brain: Generalized atrophy. Normal ventricular morphology. No midline shift or mass effect. Tiny old lacunar infarcts at superior LEFT cerebellum and RIGHT caudate head. No intracranial hemorrhage, mass lesion, or evidence of acute infarction. No extra-axial fluid collections. Vascular: Atherosclerotic calcification of internal carotid and LEFT vertebral arteries at skull base Skull: Skull intact. Minimal LEFT frontal scalp soft tissue swelling Sinuses/Orbits: Clear Other: N/A CT CERVICAL SPINE FINDINGS Alignment: Normal Skull base and vertebrae: Osseous demineralization. Mild dextroconvex cervicothoracic scoliosis. Minimal disc space narrowing and endplate spur formation. Minimal scattered facet degenerative changes. Vertebral body heights maintained without fracture or bone destruction. 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 No acute fracture or subluxation. Soft tissues and spinal canal: Prevertebral soft tissues normal thickness. Regional cervical soft tissues otherwise unremarkable. Disc levels:  No specific abnormalities. Upper chest: Lung apices clear Other: N/A IMPRESSION: Generalized atrophy. Tiny old lacunar infarcts at LEFT cerebellum and RIGHT caudate head. No acute intracranial abnormalities. Mild degenerative disc and facet disease changes of the cervical spine. 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 No acute cervical spine abnormalities. Electronically Signed   By: Ulyses SouthwardMark  Boles M.D.   On: 08/06/2020 21:02    Procedures Procedures (including critical care time)  Medications Ordered in ED Medications - No data to display  ED Course  I have reviewed the triage vital signs and  the nursing notes.  Pertinent labs & imaging results that were available during my care of the patient were reviewed by me and considered in my medical decision making (see chart for details).  Clinical Course as of Aug 06 2324  Tue Aug 06, 2020  2323 Glucose, UA(!): 50 [CA]    Clinical Course User Index [CA] Eric OkaAberman, Maddelynn Moosman C, PA-C   MDM Rules/Calculators/A&P                         74 year old male  presents to the ED from [facility] after a fall.  Chart reviewed.  Patient has been seen numerous times for the same complaint.  Patient states he has no complaints at this time however was sent to the ED for further evaluation.  Upon arrival, stable vitals.  Patient in no acute distress and nontoxic-appearing.  Physical exam reassuring.  Normal neurological exam.  No cervical, thoracic, or lumbar midline tenderness.  Will obtain routine labs to rule out electrolyte derangements and infectious etiology.  UA to rule out acute cystitis.  CT head and cervical spine to rule any acute abnormalities.  Discussed case with Dr. Myrtis Ser who evaluated patient at bedside agrees with assessment and plan.  CBC reassuring with no leukocytosis.  Mild anemia with hemoglobin 11.3.  BMP significant for hyperglycemia 159.  No anion gap.  Doubt DKA.  Elevated creatinine 1.72 and BUN at 26 which appears to be around patient's baseline.  CT head and cervical spine personally reviewed which demonstrates:   IMPRESSION:  Generalized atrophy.    Tiny old lacunar infarcts at LEFT cerebellum and RIGHT caudate head.    No acute intracranial abnormalities.    Mild degenerative disc and facet disease changes of the cervical  spine.    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    No acute cervical spine abnormalities.     EKG personally reviewed which demonstrates normal sinus rhythm with no signs of acute ischemia.  UA significant for glucosuria, but no  signs of infection.  Instructed patient to take over-the-counter Tylenol as needed for pain.  Advised patient to follow-up with PCP within the next week for further evaluation of his frequent falls. Suspect frequent falls related to decreased mobility. Per previous notes, patient has been referred to a neurologist for further evaluation. I have advised patient to call to schedule that appointment. Strict ED precautions discussed with patient. Patient states understanding and agrees to plan. Patient discharged to [facility] in no acute distress and stable vitals  Final Clinical Impression(s) / ED Diagnoses Final diagnoses:  Fall, initial encounter  Injury of head, initial encounter    Rx / DC Orders ED Discharge Orders    None       Eric Zamora 08/06/20 2334    Sabino Donovan, MD 08/10/20 [phone]

## 2020-08-06 NOTE — Discharge Instructions (Addendum)
As discussed, all of your labs and CT scans were negative for any acute abnormalities.  You may take over-the-counter Tylenol as needed for pain.  Please follow-up with PCP within the next week for further evaluation.  Call to schedule an appointment with the neurologist for further evaluation of your falls. Return to the ER for new or worsening symptoms.

## 2020-08-06 NOTE — ED Notes (Signed)
Pt from SNF for  Fall, pt deneis LOC or injury. Pt AO to person , place and time. Pt has some swelling to forehead noted

## 2020-08-06 NOTE — ED Notes (Signed)
Report called to [facility] RN

## 2020-08-07 NOTE — ED Notes (Signed)
Pt in room , sitting in chair waiting on PTAR

## 2020-08-13 ENCOUNTER — Emergency Department (HOSPITAL_COMMUNITY): Payer: Medicare Other

## 2020-08-13 ENCOUNTER — Encounter (HOSPITAL_COMMUNITY): Payer: Self-pay

## 2020-08-13 ENCOUNTER — Emergency Department (HOSPITAL_COMMUNITY)
Admission: EM | Admit: 2020-08-13 | Discharge: 2020-08-14 | Disposition: A | Discharge status: Home / Self Care | Payer: Medicare Other | Attending: Emergency Medicine | Admitting: Emergency Medicine

## 2020-08-13 DIAGNOSIS — W19XXXA Unspecified fall, initial encounter: Secondary | ICD-10-CM

## 2020-08-13 DIAGNOSIS — W07XXXA Fall from chair, initial encounter: Secondary | ICD-10-CM | POA: Diagnosis not present

## 2020-08-13 DIAGNOSIS — Z7901 Long term (current) use of anticoagulants: Secondary | ICD-10-CM | POA: Insufficient documentation

## 2020-08-13 DIAGNOSIS — S0101XA Laceration without foreign body of scalp, initial encounter: Secondary | ICD-10-CM | POA: Insufficient documentation

## 2020-08-13 DIAGNOSIS — Z7982 Long term (current) use of aspirin: Secondary | ICD-10-CM | POA: Insufficient documentation

## 2020-08-13 DIAGNOSIS — F0390 Unspecified dementia without behavioral disturbance: Secondary | ICD-10-CM | POA: Diagnosis not present

## 2020-08-13 DIAGNOSIS — Z7902 Long term (current) use of antithrombotics/antiplatelets: Secondary | ICD-10-CM | POA: Insufficient documentation

## 2020-08-13 DIAGNOSIS — I10 Essential (primary) hypertension: Secondary | ICD-10-CM | POA: Insufficient documentation

## 2020-08-13 DIAGNOSIS — S0990XA Unspecified injury of head, initial encounter: Secondary | ICD-10-CM

## 2020-08-13 DIAGNOSIS — Z79899 Other long term (current) drug therapy: Secondary | ICD-10-CM | POA: Diagnosis not present

## 2020-08-13 LAB — CBG MONITORING, ED: Glucose-Capillary: 114 mg/dL — ABNORMAL HIGH (ref 70–99)

## 2020-08-13 NOTE — ED Notes (Signed)
Pt transported to CT ?

## 2020-08-13 NOTE — ED Notes (Signed)
Called PT[facility] for patient. Pt going to [facility]. Pt is 10th in line

## 2020-08-13 NOTE — ED Provider Notes (Signed)
[facility] EMERGENCY DEPARTMENT Provider Note  CSN: [id number] Arrival date & time: 08/13/20 1747    History Chief Complaint  Patient presents with  . Seizures    HPI  Eric Zamora is a 74 y.o. male with history of dementia and bipolar was brought to the ED via EMS for evaluation after a fall. EMS reports that staff at his ALF witnessed a focal seizure-like episode prior to him falling from a chair onto the ground. He struck his head and sustained lacerations to his posterior scalp. He denies any current complaints, does not remember what happened. He falls frequently with numerous ED visits in the last 6-8 months. No prior documented history of seizures or seizure medications. He was previously on Xarelto but now only on ASA and Plavix.    Past Medical History:  Diagnosis Date  . Alzheimer disease (HCC)   . Bipolar affective disorder (HCC)   . Chronic renal insufficiency   . Dementia (HCC)   . Dyslipidemia   . HTN (hypertension)   . Obstructive cardiomyopathy [facility]) Jan 2010   gradient on TEE    Past Surgical History:  Procedure Laterality Date  . LEFT HEART CATHETERIZATION WITH CORONARY ANGIOGRAM N/A 07/06/2013   Procedure: LEFT HEART CATHETERIZATION WITH CORONARY ANGIOGRAM;  Surgeon: Marykay Lex, MD;  Location: [facility] CATH LAB;  Service: Cardiovascular;  Laterality: N/A;    History reviewed. No pertinent family history.  Social History   Tobacco Use  . Smoking status: Never Smoker  . Smokeless tobacco: Never Used  Vaping Use  . Vaping Use: Never used  Substance Use Topics  . Alcohol use: No     Home Medications Prior to Admission medications   Medication Sig Start Date End Date Taking? Authorizing Provider  acetaminophen (TYLENOL) 500 MG tablet Take 500 mg by mouth every 4 (four) hours as needed for fever or headache.    [provider]  alum & mag hydroxide-simeth (MINTOX) 200-200-20 MG/5ML suspension Take 30 mLs by mouth as needed for  indigestion or heartburn.    [provider]  aspirin 81 MG chewable tablet Chew 1 tablet (81 mg total) by mouth daily. 07/10/13   Rai, Delene Ruffini, MD  clopidogrel (PLAVIX) 75 MG tablet Take 75 mg by mouth daily.    [provider]  divalproex (DEPAKOTE SPRINKLE) 125 MG capsule Take 125 mg by mouth daily.    [provider]  ergocalciferol (VITAMIN D2) 1.25 MG (50000 UT) capsule Take 50,000 Units by mouth every 30 (thirty) days. On the 7 th of every month.    [provider]  famotidine (PEPCID) 20 MG tablet Take 1 tablet (20 mg total) by mouth 2 (two) times daily. Patient not taking: Reported on 02/01/2020 07/10/13   Rai, Delene Ruffini, MD  furosemide (LASIX) 40 MG tablet Take 1 tablet (40 mg total) by mouth daily. 07/10/13   Rai, Delene Ruffini, MD  guaiFENesin (ROBITUSSIN) 100 MG/5ML liquid Take 200 mg by mouth 4 (four) times daily as needed for cough.    [provider]  loperamide (IMODIUM) 2 MG capsule Take 2 mg by mouth as needed for diarrhea or loose stools.    [provider]  magnesium hydroxide (MILK OF MAGNESIA) 400 MG/5ML suspension Take 30 mLs by mouth at bedtime as needed for mild constipation.    [provider]  metoprolol tartrate (LOPRESSOR) 25 MG tablet Take 1 tablet (25 mg total) by mouth 2 (two) times daily. Patient taking differently: Take  37.5 mg by mouth 2 (two) times daily.  07/10/13   Rai, Delene Ruffini, MD  Neomycin-Bacitracin-Polymyxin (TRIPLE ANTIBIOTIC) [phone] OINT Apply 1 application topically as needed (for minor skin tears or abrasions).    [provider]  PARoxetine (PAXIL) 20 MG tablet Take 20 mg by mouth daily.    [provider]  potassium chloride SA (K-DUR,KLOR-CON) 20 MEQ tablet Take 2 tablets (40 mEq total) by mouth daily. 07/10/13   Rai, Delene Ruffini, MD  Rivaroxaban (XARELTO) 15 MG TABS tablet Take 1 tablet (15 mg total) by mouth 2 (two) times daily with a meal. Till 07/27/13, then  start 20mg  daily 07/10/13 07/27/13  Rai, 07/29/13, MD  Rivaroxaban (XARELTO) 20 MG TABS tablet Take 1 tablet (20 mg total) by mouth daily with supper. Start on 07/28/13, for total of 3 months Patient not taking: Reported on 04/12/2016 07/28/13   Rai, 07/30/13, MD  simvastatin (ZOCOR) 20 MG tablet Take 20 mg by mouth at bedtime.    [provider]     Allergies    Patient has no known allergies.   Review of Systems   Review of Systems A comprehensive review of systems was completed and negative except as noted in HPI.    Physical Exam BP 127/67   Pulse 72   Temp 99.2 F (37.3 C) (Oral)   Resp 18   SpO2 100%   Physical Exam Vitals and nursing note reviewed.  Constitutional:      Appearance: Normal appearance.  HENT:     Head: Normocephalic.     Comments: Occipital scalp laceration    Nose: Nose normal.     Mouth/Throat:     Mouth: Mucous membranes are moist.  Eyes:     Extraocular Movements: Extraocular movements intact.     Conjunctiva/sclera: Conjunctivae normal.  Neck:     Comments: In c-collar Cardiovascular:     Rate and Rhythm: Normal rate.  Pulmonary:     Effort: Pulmonary effort is normal.     Breath sounds: Normal breath sounds.  Abdominal:     General: Abdomen is flat.     Palpations: Abdomen is soft.     Tenderness: There is no abdominal tenderness.  Musculoskeletal:        General: No swelling. Normal range of motion.  Skin:    General: Skin is warm and dry.  Neurological:     General: No focal deficit present.     Mental Status: He is alert.  Psychiatric:        Mood and Affect: Mood normal.      ED Results / Procedures / Treatments   Labs (all labs ordered are listed, but only abnormal results are displayed) Labs Reviewed  CBG MONITORING, ED - Abnormal; Notable for the following components:      Result Value   Glucose-Capillary 114 (*)    All other components within normal limits    EKG None  Radiology CT Head Wo  Contrast  Result Date: 08/13/2020 CLINICAL DATA:  Seizure and fall. EXAM: CT HEAD WITHOUT CONTRAST CT CERVICAL SPINE WITHOUT CONTRAST TECHNIQUE: Multidetector CT imaging of the head and cervical spine was performed following the standard protocol without intravenous contrast. Multiplanar CT image reconstructions of the cervical spine were also generated. COMPARISON:  CT head and cervical spine dated August 06, 2020. FINDINGS: CT HEAD FINDINGS Brain: No evidence of acute infarction, hemorrhage, hydrocephalus, extra-axial collection or mass lesion/mass effect. Stable atrophy. Tiny chronic lacunar infarcts in the right  caudate head and left cerebellum again noted. Vascular: Atherosclerotic vascular calcification of the carotid siphons. No hyperdense vessel. Skull: Normal. Negative for fracture or focal lesion. Sinuses/Orbits: No acute finding. Other: Very mild left frontal scalp soft tissue swelling. CT CERVICAL SPINE FINDINGS Alignment: Normal. Skull base and vertebrae: No new fracture. Acute to subacute nondisplaced fracture of the C5 spinous process (series 9, image 25), unchanged from the prior study a week ago, but new since November 2. 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 No primary bone lesion or focal pathologic process. Soft tissues and spinal canal: No prevertebral fluid or swelling. No visible canal hematoma. Disc levels: Very mild multilevel disc height loss and endplate spurring. Upper chest: Negative. Other: None. IMPRESSION: 1. No acute intracranial abnormality. Very mild left frontal scalp soft tissue swelling. 2. Unchanged acute to subacute nondisplaced fracture of the C5 spinous process. No new fracture. 3. 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 Electronically Signed   By: Obie Dredge M.D.   On: 08/13/2020 18:59   CT Cervical Spine Wo Contrast  Result Date: 08/13/2020 CLINICAL DATA:  Seizure and fall. EXAM: CT HEAD WITHOUT CONTRAST CT CERVICAL SPINE WITHOUT CONTRAST  TECHNIQUE: Multidetector CT imaging of the head and cervical spine was performed following the standard protocol without intravenous contrast. Multiplanar CT image reconstructions of the cervical spine were also generated. COMPARISON:  CT head and cervical spine dated August 06, 2020. FINDINGS: CT HEAD FINDINGS Brain: No evidence of acute infarction, hemorrhage, hydrocephalus, extra-axial collection or mass lesion/mass effect. Stable atrophy. Tiny chronic lacunar infarcts in the right caudate head and left cerebellum again noted. Vascular: Atherosclerotic vascular calcification of the carotid siphons. No hyperdense vessel. Skull: Normal. Negative for fracture or focal lesion. Sinuses/Orbits: No acute finding. Other: Very mild left frontal scalp soft tissue swelling. CT CERVICAL SPINE FINDINGS Alignment: Normal. Skull base and vertebrae: No new fracture. Acute to subacute nondisplaced fracture of the C5 spinous process (series 9, image 25), unchanged from the prior study a week ago, but new since November 2. 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 No primary bone lesion or focal pathologic process. Soft tissues and spinal canal: No prevertebral fluid or swelling. No visible canal hematoma. Disc levels: Very mild multilevel disc height loss and endplate spurring. Upper chest: Negative. Other: None. IMPRESSION: 1. No acute intracranial abnormality. Very mild left frontal scalp soft tissue swelling. 2. Unchanged acute to subacute nondisplaced fracture of the C5 spinous process. No new fracture. 3. 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 Electronically Signed   By: Obie Dredge M.D.   On: 08/13/2020 18:59    Procedures Procedures  Medications Ordered in the ED Medications - No data to display   MDM Rules/Calculators/A&P MDM Patient with recurrent falls, back today after possible seizure like activity. Will send for CT head/cspine. He was here for similar about a week ago and had a  negative medical workup then. Will also attempt to contact staff at his ALF as well.  ED Course  I have reviewed the triage vital signs and the nursing notes.  Pertinent labs & imaging results that were available during my care of the patient were reviewed by me and considered in my medical decision making (see chart for details).  Clinical Course as of 08/13/20 1925  Tue Aug 13, 2020  1816 Spoke with several staff members at the facility, none of whom actually witnessed him fall but they report that when they got to him he was 'having a seizure'  but they could not further describe the episode including what part of his body was shaking or for how long.  [CS]  1908 CT images and results reviewed, no acute intracranial process. Old spine injuries without acute change.  [CS]  1922 C-collar removed. On re-exam, patient has a small <1cm laceration to R occipital scalp has already closed. Has some dried blood elsewhere but no other skin breaks. He is eager to go home, states he was never unconscious and doesn't think he had a seizure. Will return to ALF.  [CS]    Clinical Course User Index [CS] Pollyann SavoySheldon, Shine Scrogham B, MD    Final Clinical Impression(s) / ED Diagnoses Final diagnoses:  Fall, initial encounter  Injury of head, initial encounter    Rx / DC Orders ED Discharge Orders    None       Pollyann SavoySheldon, Kaamil Morefield B, MD 08/13/20 1925

## 2020-08-13 NOTE — ED Triage Notes (Signed)
Pt bib EMS from [facility] facility for reports of a witnessed localized seizure to left arm. Pt fell to ground from chair during seizure and hit head. 2 lacs present to back of head. Pt on Plavix.    Pt reports no pain during triage. A&Ox4  Hx of seizures and alzheimer's  Vitals: BP: 144/66 HR: 86 RR: 20 O2: 95% RA CBG: 143 Temp: 98.7

## 2020-08-14 NOTE — ED Notes (Signed)
PTAR at bedside at this time.  

## 2020-10-07 ENCOUNTER — Emergency Department (HOSPITAL_COMMUNITY)
Admission: EM | Admit: 2020-10-07 | Discharge: 2020-10-08 | Disposition: A | Discharge status: Home / Self Care | Payer: Medicare Other | Attending: Emergency Medicine | Admitting: Emergency Medicine

## 2020-10-07 ENCOUNTER — Emergency Department (HOSPITAL_COMMUNITY): Payer: Medicare Other

## 2020-10-07 DIAGNOSIS — W19XXXA Unspecified fall, initial encounter: Secondary | ICD-10-CM

## 2020-10-07 DIAGNOSIS — S0083XA Contusion of other part of head, initial encounter: Secondary | ICD-10-CM

## 2020-10-07 DIAGNOSIS — Y9289 Other specified places as the place of occurrence of the external cause: Secondary | ICD-10-CM | POA: Insufficient documentation

## 2020-10-07 DIAGNOSIS — Y9301 Activity, walking, marching and hiking: Secondary | ICD-10-CM | POA: Diagnosis not present

## 2020-10-07 DIAGNOSIS — S0990XA Unspecified injury of head, initial encounter: Secondary | ICD-10-CM | POA: Diagnosis present

## 2020-10-07 DIAGNOSIS — W010XXA Fall on same level from slipping, tripping and stumbling without subsequent striking against object, initial encounter: Secondary | ICD-10-CM | POA: Insufficient documentation

## 2020-10-07 LAB — BASIC METABOLIC PANEL
Anion gap: 11 (ref 5–15)
BUN: 17 mg/dL (ref 8–23)
CO2: 22 mmol/L (ref 22–32)
Calcium: 8.8 mg/dL — ABNORMAL LOW (ref 8.9–10.3)
Chloride: 105 mmol/L (ref 98–111)
Creatinine, Ser: 1.54 mg/dL — ABNORMAL HIGH (ref 0.61–1.24)
GFR, Estimated: 47 mL/min — ABNORMAL LOW (ref >60)
Glucose, Bld: 151 mg/dL — ABNORMAL HIGH (ref 70–99)
Potassium: 4.4 mmol/L (ref 3.5–5.1)
Sodium: 138 mmol/L (ref 135–145)

## 2020-10-07 LAB — CBC WITH DIFFERENTIAL/PLATELET
Abs Immature Granulocytes: 0.03 10*3/uL (ref 0.00–0.07)
Basophils Absolute: 0.1 10*3/uL (ref 0.0–0.1)
Basophils Relative: 2 %
Eosinophils Absolute: 0.3 10*3/uL (ref 0.0–0.5)
Eosinophils Relative: 5 %
HCT: 37.2 % — ABNORMAL LOW (ref 39.0–52.0)
Hemoglobin: 12.5 g/dL — ABNORMAL LOW (ref 13.0–17.0)
Immature Granulocytes: 1 %
Lymphocytes Relative: 40 %
Lymphs Abs: 2.1 10*3/uL (ref 0.7–4.0)
MCH: 29.7 pg (ref 26.0–34.0)
MCHC: 33.6 g/dL (ref 30.0–36.0)
MCV: 88.4 fL (ref 80.0–100.0)
Monocytes Absolute: 0.9 10*3/uL (ref 0.1–1.0)
Monocytes Relative: 16 %
Neutro Abs: 1.9 10*3/uL (ref 1.7–7.7)
Neutrophils Relative %: 36 %
Platelets: 187 10*3/uL (ref 150–400)
RBC: 4.21 MIL/uL — ABNORMAL LOW (ref 4.22–5.81)
RDW: 12.9 % (ref 11.5–15.5)
WBC: 5.3 10*3/uL (ref 4.0–10.5)
nRBC: 0 % (ref 0.0–0.2)

## 2020-10-07 NOTE — ED Notes (Signed)
Attempted to call facility multiple times, no answer.

## 2020-10-07 NOTE — ED Triage Notes (Signed)
Patient came in as a Level II fall on plavix. Per ems pt was walking around at [place] when he was walking too fast and tripped. Patient hit his head. Patient has a hematoma to the L forehead. Fall was unwitnessed. Patient denies LOC/neck pain/back pain. Patient has no complaints. Patient is Alert and oriented x4 at baseline. No other deformities noted.

## 2020-10-07 NOTE — ED Provider Notes (Signed)
[facility] EMERGENCY DEPARTMENT Provider Note   CSN: [id number] Arrival date & time: 10/07/20  2101     History Chief Complaint  Patient presents with  . Fall    Eric Zamora is a 75 y.o. male.  The history is provided by the patient, the EMS personnel and medical records.   Eric Zamora is a 75 y.o. male who presents to the Emergency Department complaining of fall. He presents the emergency department by EMS as a level II trauma alert following a fall. He is a resident at [facility] and was walking down the hallway when he tripped and fell, striking his head. There is no loss of consciousness. He denies any complaints. He does take Plavix. He denies any pain. Denies any recent illnesses.    No past medical history on file. Alzheimers, HTN, cardiomyopathy, CKD. There are no problems to display for this patient.        No family history on file.     Home Medications Prior to Admission medications   Not on File    Allergies    Patient has no allergy information on record.  Review of Systems   Review of Systems  All other systems reviewed and are negative.   Physical Exam Updated Vital Signs BP (!) 141/81   Pulse 86   Temp 98.3 F (36.8 C) (Oral)   Resp 20   SpO2 98%   Physical Exam Vitals and nursing note reviewed.  Constitutional:      Appearance: He is well-developed and well-nourished.  HENT:     Head: Normocephalic.     Comments: Large hematoma to the left forehead. Neck:     Comments: No midline cervical spine tenderness Cardiovascular:     Rate and Rhythm: Normal rate and regular rhythm.     Heart sounds: No murmur heard.   Pulmonary:     Effort: Pulmonary effort is normal. No respiratory distress.     Breath sounds: Normal breath sounds.  Abdominal:     Palpations: Abdomen is soft.     Tenderness: There is no abdominal tenderness. There is no guarding or rebound.  Musculoskeletal:        General: No  swelling, tenderness or edema.  Skin:    General: Skin is warm and dry.  Neurological:     Mental Status: He is alert and oriented to person, place, and time.     Comments: Five out of five strength in all four extremities with sensational light touch intact in all four extremities.  Psychiatric:        Mood and Affect: Mood and affect normal.        Behavior: Behavior normal.     ED Results / Procedures / Treatments   Labs (all labs ordered are listed, but only abnormal results are displayed) Labs Reviewed  BASIC METABOLIC PANEL  CBC WITH DIFFERENTIAL/PLATELET    EKG EKG Interpretation  Date/Time:  Monday October 07 2020 21:07:38 EST Ventricular Rate:  86 PR Interval:    QRS Duration: 91 QT Interval:  380 QTC Calculation: 455 R Axis:   68 Text Interpretation: Sinus or ectopic atrial rhythm Abnormal T, consider ischemia, diffuse leads no previous tracing available Confirmed by Tilden Fossa [phone]) on 10/07/2020 9:16:06 PM   Radiology No results found.  Procedures Procedures   Medications Ordered in ED Medications - No data to display  ED Course  I have reviewed the triage vital signs and the nursing notes.  Pertinent labs & imaging results that were available during my care of the patient were reviewed by me and considered in my medical decision making (see chart for details).    MDM Rules/Calculators/A&P                         patient here as a level II trauma alert following a mechanical fall. He has a large hematoma on his forehead, no additional apparent injuries. EKG with T-wave inversion of the lateral leads, this is unchanged from priors. He has no chest pain. CT is negative for intracranial abnormality. A CT cervical spine demonstrates chronic changes. Labs are this baseline. Patient is asymptomatic and able to ambulate without difficulty in the department. Plan to discharge home with local hematoma care, outpatient follow-up and return precautions.  Attempted to contact patient's sister for update, numbers are not in service.  Final Clinical Impression(s) / ED Diagnoses Final diagnoses:  Fall, initial encounter  Contusion of face, initial encounter    Rx / DC Orders ED Discharge Orders    None       Tilden Fossa, MD 10/07/20 2250

## 2020-10-07 NOTE — ED Notes (Signed)
PTAR CALLED (7 or so in front of pt)

## 2020-10-08 NOTE — ED Notes (Signed)
Pt was transferred back to facility

## 2020-10-17 ENCOUNTER — Ambulatory Visit: Payer: Medicare Other | Admitting: Neurology

## 2020-12-10 ENCOUNTER — Encounter: Payer: Self-pay | Admitting: Neurology

## 2020-12-10 ENCOUNTER — Ambulatory Visit (INDEPENDENT_AMBULATORY_CARE_PROVIDER_SITE_OTHER): Payer: Medicare Other | Admitting: Neurology

## 2020-12-10 ENCOUNTER — Telehealth: Payer: Self-pay | Admitting: Neurology

## 2020-12-10 VITALS — Ht 66.0 in | Wt 176.5 lb

## 2020-12-10 DIAGNOSIS — F39 Unspecified mood [affective] disorder: Secondary | ICD-10-CM | POA: Insufficient documentation

## 2020-12-10 DIAGNOSIS — R55 Syncope and collapse: Secondary | ICD-10-CM | POA: Diagnosis not present

## 2020-12-10 DIAGNOSIS — W19XXXS Unspecified fall, sequela: Secondary | ICD-10-CM

## 2020-12-10 DIAGNOSIS — W19XXXA Unspecified fall, initial encounter: Secondary | ICD-10-CM | POA: Insufficient documentation

## 2020-12-10 NOTE — Progress Notes (Signed)
Chief Complaint  Patient presents with  . New Patient (Initial Visit)    He is here with a caregiver, [place], from [place]. She reports several passing out spells for this patient. She is unable to provide much detail about the episodes. Orthostatic Vitals: Lying: 133/73, 84, Sitting: 105/63, 86, Standing: 113/63, 82, Standing x 3: 106/65, 81.      ASSESSMENT AND PLAN  Eric Zamora is a 75 y.o. male   Frequent fall  Patient was found to have quick rushed speech and walking  He does has mild fixation of left upper extremity on rapid alternating movement, hyperreflexia of bilateral patella and upper extremity   Differentiation diagnosis of his frequent falls including lack of judgment, with his underlying long-term mood disorder, potential polypharmacy treatment, memory loss,  Also need to rule out possibility of cervical and thoracic myelopathy, supratentorium small vessel disease  Proceed with MRI of the brain, cervical, thoracid  Labs Cognitive impairment  Mini-Mental status examination 20/30 also in the setting of lifelong history of mood disorder, psychotic medication treatment  DIAGNOSTIC DATA (LABS, IMAGING, TESTING) - I reviewed patient records, labs, notes, testing and imaging myself where available.  CT head and cervical   1. Left frontal scalp swelling and thickening with hyperdense scalp hematoma measuring up to 12 mm in maximal thickness. Minimal soft tissue swelling extending across the left periorbital tissues with palpebral thickening as well. No subjacent calvarial fracture or visible facial bone fracture within the included margins of imaging. 2. No acute intracranial abnormality. 3. Stable chronic microvascular angiopathy and mild parenchymal volume loss. Stable hypoattenuating foci compatible remote lacunar infarcts as detailed above. 4. 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 5. Partial union across a C5  spinous process fracture. 6. No new vertebral fracture or traumatic listhesis is seen. 7. Multilevel degenerative changes of the cervical spine as described above.  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 Left lateral mass C1 fracture with posterior subluxation of the C1 facet relative to C2. 2. Likely tears of the anterior atlantooccipital ligament and apical ligament. Tectorial membrane, posterior longitudinal ligament, ligamentum flavum and the sub C1 anterior longitudinal ligament are intact. 3. Normal cervical spinal cord signal.   Laboratory evaluation in 2022: Mild anemia hemoglobin 12.5, CMP showed elevated creatinine 1.54, mildly decreased calcium 8.8, glucose was 151,  Depakote level was 23 in August 2021  HISTORICAL  Eric Zamora is a 75 year old nursing home resident,, seen in request by his primary care PA Kurth-Bowen, Cornelia, for evaluation of frequent falling.  Initial evaluation was on December 10, 2020, wasbroughtinby nursing home transportation, the staff was not able to provide any meaningful history  I reviewed multiple emergency room visit Past medical history of Alzheimer's disease Bipolar affective disorder Chronic renal insufficiency Hypertension Hyperlipidemia  Patient was brought in by nursing home staff, himself and the staff was not able to provide detailed medical history, I spent extensive time reviewed his emergency room presentation, and hospital discharge, he went to nursing home since hospital admission in November 2014, when he suffered severe sepsis due to pneumonia, likely a component of congestive heart failure, required BiPAP support, patient reported prior to that, he lives by himself,  retired  from manufacturing job, reported have 5 children, all living out of state, he only sees him occasionally,  He could not elaborate on why he has frequent falling, but presented to emergency room numerous times for fall, had multiple CAT scan, cervical spine, with evidence of C5 spinous process fracture, and the left C1 nonunited lateral mass fracture,  Patient stated that since February 2022, he spent most of his time in wheelchair, he denies significant pain, denies bowel bladder incontinence, when he moves, he tends to move very quickly,  August 13, 2020, seizure, falling from a chair onto the ground, struck his head, sustained laceration to his posterior scalp,  August 06, 2020, November 2, August 12, July 17, July 6, June 3, April 12, November 30, 2019, May 1, October 25, 2018, August 13, 2 027, fall  Hospital admission July 10, 2013, he was already diagnosed with dementia, hypertrophic cardiomyopathy, was admitted for dyspnea, have STEMI, severe sepsis possibly due to pneumonia, versus congestive heart failure, required BiPAP support,  Echocardiogram in November 2014 reversibility in the anterior septal wall and a prior area of fixed defect, 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  PHYSICAL EXAM   Vitals:   12/10/20 1115  Weight: 176 lb 8 oz (80.1 kg)  Height: 5\' 6"  (1.676 m)   Not recorded     Body mass index is 28.49 kg/m.  PHYSICAL EXAMNIATION:  Gen: NAD, conversant, well nourised, well groomed                     Cardiovascular: Regular  rate rhythm, no peripheral edema, warm, nontender. Eyes: Conjunctivae clear without exudates or hemorrhage Neck: Supple, no carotid bruits. Pulmonary: Clear to auscultation bilaterally   NEUROLOGICAL EXAM:  MENTAL STATUS: Speech: Quick burst of endentured speech; fluent and spontaneous with normal comprehension.   MMSE - Mini Mental State Exam 12/10/2020  Orientation to time 5  Orientation to Place 1  Registration 3  Attention/ Calculation 0  Recall 3  Language- name 2 objects 2  Language- repeat 1  Language- follow 3 step command 3  Language- read & follow direction 1  Write a sentence 0  Copy design 1  Total score 20   CRANIAL NERVES: CN II: Visual fields are full to confrontation. Pupils are round equal and briskly reactive to light. CN III, IV, VI: extraocular movement are normal. No ptosis. CN V: Facial sensation is intact to light touch CN VII: Face is symmetric with normal eye closure  CN VIII: Hearing is normal to causal conversation. CN IX, X: Phonation is normal. CN XI: Head turning and shoulder shrug are intact  MOTOR: Mild fixation of left upper extremity on rapid rotating movement, no significant lower extremity proximal and distal muscle weakness  REFLEXES: Reflexes are 2+ and symmetric at the biceps, triceps, 3/3 knees, and trace at ankles. Plantar responses are flexor.  SENSORY: Intact to light touch, pinprick and vibratory sensation are intact in fingers and toes.  COORDINATION: There is no trunk or limb dysmetria noted.  GAIT/STANCE: He can get up from seated position arm  crossed, tends to separate both feet, but fairly steady, quick and rush to walk, could stand on tiptoe, heels, negative Romberg sign neck  ALLERGIES: No Known Allergies  HOME MEDICATIONS: Current Outpatient Medications  Medication Sig Dispense Refill  . acetaminophen (TYLENOL) 500 MG tablet Take 500 mg by mouth every 4 (four) hours as needed for fever or headache.    Marland Kitchen alum &  mag hydroxide-simeth (MINTOX) 200-200-20 MG/5ML suspension Take 30 mLs by mouth as needed for indigestion or heartburn.    Marland Kitchen aspirin 81 MG chewable tablet Chew 1 tablet (81 mg total) by mouth daily. 30 tablet 3  . clopidogrel (PLAVIX) 75 MG tablet Take 75 mg by mouth daily.    . divalproex (DEPAKOTE SPRINKLE) 125 MG capsule Take 125 mg by mouth daily.    . ergocalciferol (VITAMIN D2) 1.25 MG (50000 UT) capsule Take 50,000 Units by mouth every 30 (thirty) days. On the 7 th of every month.    . famotidine (PEPCID) 20 MG tablet Take 1 tablet (20 mg total) by mouth 2 (two) times daily. (Patient not taking: Reported on 02/01/2020)    . furosemide (LASIX) 40 MG tablet Take 1 tablet (40 mg total) by mouth daily. 30 tablet   . guaiFENesin (ROBITUSSIN) 100 MG/5ML liquid Take 200 mg by mouth 4 (four) times daily as needed for cough.    . loperamide (IMODIUM) 2 MG capsule Take 2 mg by mouth as needed for diarrhea or loose stools.    . magnesium hydroxide (MILK OF MAGNESIA) 400 MG/5ML suspension Take 30 mLs by mouth at bedtime as needed for mild constipation.    . metoprolol tartrate (LOPRESSOR) 25 MG tablet Take 1 tablet (25 mg total) by mouth 2 (two) times daily. (Patient taking differently: Take 37.5 mg by mouth 2 (two) times daily. )    . Neomycin-Bacitracin-Polymyxin (TRIPLE ANTIBIOTIC) [phone] OINT Apply 1 application topically as needed (for minor skin tears or abrasions).    Marland Kitchen PARoxetine (PAXIL) 20 MG tablet Take 20 mg by mouth daily.    . potassium chloride SA (K-DUR,KLOR-CON) 20 MEQ tablet Take 2 tablets (40 mEq total) by mouth daily.    . Rivaroxaban (XARELTO) 15 MG TABS tablet Take 1 tablet (15 mg total) by mouth 2 (two) times daily with a meal. Till 07/27/13, then start 20mg  daily 40 tablet 0  . Rivaroxaban (XARELTO) 20 MG TABS tablet Take 1 tablet (20 mg total) by mouth daily with supper. Start on 07/28/13, for total of 3 months (Patient not taking: Reported on 04/12/2016) 80 tablet 0  .  simvastatin (ZOCOR) 20 MG tablet Take 20 mg by mouth at bedtime.     No current facility-administered medications for this visit.    PAST MEDICAL HISTORY: Past Medical History:  Diagnosis Date  . Alzheimer disease (HCC)   . Bipolar affective disorder (HCC)   . Chronic renal insufficiency   . Dementia (HCC)   . Dyslipidemia   . HTN (hypertension)   . Obstructive cardiomyopathy [facility]) Jan 2010   Feb 2010 gradient on TEE  . Syncopal episodes     PAST SURGICAL HISTORY: Past Surgical History:  Procedure Laterality Date  . LEFT HEART CATHETERIZATION WITH CORONARY ANGIOGRAM N/A 07/06/2013   Procedure: LEFT HEART CATHETERIZATION WITH CORONARY ANGIOGRAM;  Surgeon: 13/01/2013, MD;  Location: [facility] CATH LAB;  Service: Cardiovascular;  Laterality: N/A;    FAMILY HISTORY: History reviewed. No pertinent family history.  SOCIAL HISTORY: Social History   Socioeconomic History  . Marital status: Divorced  Spouse name: Not on file  . Number of children: 5  . Years of education: some college  . Highest education level: Not on file  Occupational History  . Occupation: Retired  Tobacco Use  . Smoking status: Never Smoker  . Smokeless tobacco: Never Used  Vaping Use  . Vaping Use: Never used  Substance and Sexual Activity  . Alcohol use: No  . Drug use: Not Currently  . Sexual activity: Not on file  Other Topics Concern  . Not on file  Social History Narrative   [facility]   Right-handed.   No daily caffeine use.       Social Determinants of Health   Financial Resource Strain: Not on file  Food Insecurity: Not on file  Transportation Needs: Not on file  Physical Activity: Not on file  Stress: Not on file  Social Connections: Not on file  Intimate Partner Violence: Not on file     Total time spent reviewing the chart, obtaining history, examined patient, ordering tests, documentation, consultations and family, care coordination was 65 minutes    Levert FeinsteinYijun  Briony Parveen, M.D. Ph.D.  [facility] [address], [address] [place], [place] [address] Ph: [phone] Fax: [phone]  CC:  Uvaldo BristleKurth-Bowen, Cornelia, PA-C [address] [place],  [place] [id number]  Uvaldo BristleKurth-Bowen, Cornelia, PA-C

## 2020-12-10 NOTE — Telephone Encounter (Signed)
medicare/medicaid order sent to [facility]. No auth they will reach out to the patient to schedule.  °

## 2020-12-12 ENCOUNTER — Telehealth: Payer: Self-pay | Admitting: Neurology

## 2020-12-12 DIAGNOSIS — A530 Latent syphilis, unspecified as early or late: Secondary | ICD-10-CM | POA: Insufficient documentation

## 2020-12-12 LAB — HIV ANTIBODY (ROUTINE TESTING W REFLEX): HIV Screen 4th Generation wRfx: NONREACTIVE

## 2020-12-12 LAB — VITAMIN B12: Vitamin B-12: 575 pg/mL (ref 232–1245)

## 2020-12-12 LAB — RPR, QUANT+TP ABS (REFLEX)
Rapid Plasma Reagin, Quant: 1:2 {titer} — ABNORMAL HIGH
T Pallidum Abs: REACTIVE — AB

## 2020-12-12 LAB — CK: Total CK: 169 U/L (ref 41–331)

## 2020-12-12 LAB — RPR: RPR Ser Ql: REACTIVE — AB

## 2020-12-12 NOTE — Telephone Encounter (Signed)
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

## 2020-12-12 NOTE — Addendum Note (Signed)
Addended by: Levert Feinstein on: 12/12/2020 09:21 AM   Modules accepted: Orders

## 2020-12-12 NOTE — Telephone Encounter (Signed)
I spoke to the care manager at [facility] [phone]). She informed me this diagnosis is not documented anywhere in his past history. However, she said it is not uncommon for them to get limited information when patients are admitted there. I ask about his family. She said they have not been able to contact his family for so long that they have requested a legal guardian assignment through social services. This is in process.  Dr. Terrace Arabia is aware and would like to move forward with the LP.

## 2020-12-12 NOTE — Telephone Encounter (Signed)
Orders Placed This Encounter  Procedures  . DG FLUORO GUIDED LOC OF NEEDLE/CATH TIP FOR SPINAL INJECT LT

## 2020-12-23 NOTE — Telephone Encounter (Signed)
Olegario Messier will call Crystal #[phone]). To schedule LP . Thanks [employer]

## 2020-12-27 ENCOUNTER — Ambulatory Visit
Admission: RE | Admit: 2020-12-27 | Discharge: 2020-12-27 | Disposition: A | Discharge status: Home / Self Care | Payer: Medicare Other | Source: Ambulatory Visit | Attending: Neurology | Admitting: Neurology

## 2020-12-27 DIAGNOSIS — W19XXXS Unspecified fall, sequela: Secondary | ICD-10-CM

## 2020-12-27 DIAGNOSIS — R55 Syncope and collapse: Secondary | ICD-10-CM | POA: Diagnosis not present

## 2020-12-27 DIAGNOSIS — F39 Unspecified mood [affective] disorder: Secondary | ICD-10-CM

## 2020-12-30 ENCOUNTER — Other Ambulatory Visit: Payer: Medicare Other

## 2020-12-31 ENCOUNTER — Telehealth: Payer: Self-pay | Admitting: Neurology

## 2020-12-31 NOTE — Telephone Encounter (Signed)
I provided his MRI brain, cervical results to his care manager (Chrystal Millbourne) at [facility]. The results were also faxed to her attention to provide to the resident MD.  Ph: [phone] Fax: [phone]

## 2020-12-31 NOTE — Telephone Encounter (Signed)
  IMPRESSION: This MRI of the cervical spine without contrast shows the following: 1.   The spinal cord appears normal. 2.   Chronic left C1 fracture.  This is better observed on the CT scan from 10/07/2020. 3.   Mild multilevel degenerative changes as detailed above that do not lead to nerve root compression or spinal stenosis. IMPRESSION: This MRI of the brain without contrast shows the following: 1.   Moderate generalized cortical atrophy. 2.   Small lacunar infarctions in the left cerebellar hemisphere, right caudate head and right centrum semiovale.  None of these appear to be acute.  Additionally, there is mild chronic microvascular ischemic changes in the hemispheres. 3.   No acute findings.  Please call patient, MRI of the brain showed generalized atrophy, lacunar infarction in left cerebellum, right deep brain structure, mild supratentorium small vessel disease, no acute abnormality  MRI of cervical spine showed mild multilevel degenerative changes, chronic left C1 fracture, no evidence of spinal cord or nerve root compression

## 2021-01-03 ENCOUNTER — Ambulatory Visit
Admission: RE | Admit: 2021-01-03 | Discharge: 2021-01-03 | Disposition: A | Discharge status: Home / Self Care | Payer: Medicare Other | Source: Ambulatory Visit | Attending: Neurology | Admitting: Neurology

## 2021-01-03 ENCOUNTER — Other Ambulatory Visit: Payer: Self-pay

## 2021-01-03 DIAGNOSIS — A530 Latent syphilis, unspecified as early or late: Secondary | ICD-10-CM

## 2021-01-03 NOTE — Discharge Instructions (Signed)
Lumbar Puncture Discharge Instructions  1. Go home and rest quietly as needed. You may resume normal activities; however, do not exert yourself strongly or do any heavy lifting today and tomorrow.   2. DO NOT drive today.    3. You may resume your normal diet and medications unless otherwise indicated. Drink lots of extra fluids today and tomorrow.   4. The incidence of headache, nausea, or vomiting is about 5% (one in 20 patients).  If you develop a headache, lie flat for 24 hours and drink plenty of fluids until the headache goes away.  Caffeinated beverages may be helpful. If when you get up you still have a headache when standing, go back to bed and force fluids for another 24 hours.   5. If you develop severe nausea and vomiting or a headache that does not go away with the flat bedrest after 48 hours, please call [phone].   6. Call your physician for a follow-up appointment.  The results of your Lumbar Puncture will be sent directly to your physician and they will contact you.   7. If you have any questions or if complications develop after you arrive home, please call [phone].  Discharge instructions have been explained to the patient.  The patient, or the person responsible for the patient, fully understands these instructions.   Thank you for visiting our office today.  

## 2021-01-07 ENCOUNTER — Ambulatory Visit
Admission: RE | Admit: 2021-01-07 | Discharge: 2021-01-07 | Disposition: A | Discharge status: Home / Self Care | Payer: Medicare Other | Source: Ambulatory Visit | Attending: Neurology | Admitting: Neurology

## 2021-01-07 ENCOUNTER — Other Ambulatory Visit: Payer: Self-pay

## 2021-01-07 DIAGNOSIS — W19XXXS Unspecified fall, sequela: Secondary | ICD-10-CM

## 2021-01-07 DIAGNOSIS — F39 Unspecified mood [affective] disorder: Secondary | ICD-10-CM

## 2021-01-09 ENCOUNTER — Telehealth: Payer: Self-pay | Admitting: *Deleted

## 2021-01-09 NOTE — Telephone Encounter (Signed)
-----   Message from Levert Feinstein, MD sent at 01/09/2021  3:50 PM EDT ----- Please call pt for normal MRI thoracic spine

## 2021-01-09 NOTE — Telephone Encounter (Signed)
I provided his MRI thoracic results to his care manager (Chrystal Augusta) at [facility]. The results were also faxed to her attention to provide to the resident MD.  Ph: [phone] Fax: [phone]

## 2021-06-09 ENCOUNTER — Emergency Department (HOSPITAL_COMMUNITY): Payer: Medicare Other

## 2021-06-09 ENCOUNTER — Other Ambulatory Visit: Payer: Self-pay

## 2021-06-09 ENCOUNTER — Emergency Department (HOSPITAL_COMMUNITY)
Admission: EM | Admit: 2021-06-09 | Discharge: 2021-06-09 | Disposition: A | Discharge status: Home / Self Care | Payer: Medicare Other | Attending: Emergency Medicine | Admitting: Emergency Medicine

## 2021-06-09 DIAGNOSIS — S0101XA Laceration without foreign body of scalp, initial encounter: Secondary | ICD-10-CM

## 2021-06-09 DIAGNOSIS — I129 Hypertensive chronic kidney disease with stage 1 through stage 4 chronic kidney disease, or unspecified chronic kidney disease: Secondary | ICD-10-CM | POA: Diagnosis not present

## 2021-06-09 DIAGNOSIS — Z23 Encounter for immunization: Secondary | ICD-10-CM | POA: Diagnosis not present

## 2021-06-09 DIAGNOSIS — N189 Chronic kidney disease, unspecified: Secondary | ICD-10-CM | POA: Diagnosis not present

## 2021-06-09 DIAGNOSIS — S0181XA Laceration without foreign body of other part of head, initial encounter: Secondary | ICD-10-CM | POA: Insufficient documentation

## 2021-06-09 DIAGNOSIS — W01198A Fall on same level from slipping, tripping and stumbling with subsequent striking against other object, initial encounter: Secondary | ICD-10-CM | POA: Diagnosis not present

## 2021-06-09 DIAGNOSIS — Z7902 Long term (current) use of antithrombotics/antiplatelets: Secondary | ICD-10-CM | POA: Insufficient documentation

## 2021-06-09 DIAGNOSIS — S0990XA Unspecified injury of head, initial encounter: Secondary | ICD-10-CM | POA: Diagnosis present

## 2021-06-09 DIAGNOSIS — F0390 Unspecified dementia without behavioral disturbance: Secondary | ICD-10-CM | POA: Insufficient documentation

## 2021-06-09 DIAGNOSIS — W19XXXA Unspecified fall, initial encounter: Secondary | ICD-10-CM

## 2021-06-09 LAB — COMPREHENSIVE METABOLIC PANEL
ALT: 13 U/L (ref 0–44)
AST: 21 U/L (ref 15–41)
Albumin: 3.4 g/dL — ABNORMAL LOW (ref 3.5–5.0)
Alkaline Phosphatase: 34 U/L — ABNORMAL LOW (ref 38–126)
Anion gap: 8 (ref 5–15)
BUN: 23 mg/dL (ref 8–23)
CO2: 22 mmol/L (ref 22–32)
Calcium: 8.2 mg/dL — ABNORMAL LOW (ref 8.9–10.3)
Chloride: 105 mmol/L (ref 98–111)
Creatinine, Ser: 1.54 mg/dL — ABNORMAL HIGH (ref 0.61–1.24)
GFR, Estimated: 46 mL/min — ABNORMAL LOW (ref >60)
Glucose, Bld: 133 mg/dL — ABNORMAL HIGH (ref 70–99)
Potassium: 4.7 mmol/L (ref 3.5–5.1)
Sodium: 135 mmol/L (ref 135–145)
Total Bilirubin: 0.5 mg/dL (ref 0.3–1.2)
Total Protein: 6.4 g/dL — ABNORMAL LOW (ref 6.5–8.1)

## 2021-06-09 LAB — CBC WITH DIFFERENTIAL/PLATELET
Abs Immature Granulocytes: 0.01 10*3/uL (ref 0.00–0.07)
Basophils Absolute: 0.1 10*3/uL (ref 0.0–0.1)
Basophils Relative: 2 %
Eosinophils Absolute: 0.4 10*3/uL (ref 0.0–0.5)
Eosinophils Relative: 6 %
HCT: 35.3 % — ABNORMAL LOW (ref 39.0–52.0)
Hemoglobin: 11.3 g/dL — ABNORMAL LOW (ref 13.0–17.0)
Immature Granulocytes: 0 %
Lymphocytes Relative: 32 %
Lymphs Abs: 1.9 10*3/uL (ref 0.7–4.0)
MCH: 28.8 pg (ref 26.0–34.0)
MCHC: 32 g/dL (ref 30.0–36.0)
MCV: 89.8 fL (ref 80.0–100.0)
Monocytes Absolute: 1 10*3/uL (ref 0.1–1.0)
Monocytes Relative: 17 %
Neutro Abs: 2.5 10*3/uL (ref 1.7–7.7)
Neutrophils Relative %: 43 %
Platelets: 173 10*3/uL (ref 150–400)
RBC: 3.93 MIL/uL — ABNORMAL LOW (ref 4.22–5.81)
RDW: 13.4 % (ref 11.5–15.5)
WBC: 5.9 10*3/uL (ref 4.0–10.5)
nRBC: 0 % (ref 0.0–0.2)

## 2021-06-09 LAB — TROPONIN I (HIGH SENSITIVITY): Troponin I (High Sensitivity): 15 ng/L (ref <18)

## 2021-06-09 MED ORDER — TETANUS-DIPHTH-ACELL PERTUSSIS 5-2.5-18.5 LF-MCG/0.5 IM SUSY
0.5000 mL | PREFILLED_SYRINGE | Freq: Once | INTRAMUSCULAR | Status: AC
  Administered 2021-06-09: 0.5 mL via INTRAMUSCULAR
  Filled 2021-06-09: qty 0.5

## 2021-06-09 NOTE — ED Provider Notes (Signed)
[facility] EMERGENCY DEPARTMENT Provider Note   CSN: [id number] Arrival date & time: 06/09/21  1727     History Chief Complaint  Patient presents with   [name]    Eric Zamora is a 75 y.o. male history of dementia, hypertension, CKD here presenting with fall.  Patient is on Plavix.  Patient states that he does not remember how he fell but has frequent falls.  He was noted to have a laceration above his right eyebrow.  Patient denies any chest pain or shortness of breath.  Denies any hip pain or back pain.  Patient was seen multiple times recently for falls.  The history is provided by the patient.      No past medical history on file.  There are no problems to display for this patient.      No family history on file.     Home Medications Prior to Admission medications   Not on File    Allergies    Patient has no allergy information on record.  Review of Systems   Review of Systems  Skin:  Positive for wound.  All other systems reviewed and are negative.  Physical Exam Updated Vital Signs There were no vitals taken for this visit.  Physical Exam Vitals and nursing note reviewed.  Constitutional:      Comments: Demented  HENT:     Head:     Comments: 5 cm laceration of the right forehead and extending to the lateral aspect of the right eyebrow    Nose: Nose normal.     Mouth/Throat:     Mouth: Mucous membranes are moist.  Eyes:     Extraocular Movements: Extraocular movements intact.     Pupils: Pupils are equal, round, and reactive to light.  Cardiovascular:     Rate and Rhythm: Normal rate and regular rhythm.     Pulses: Normal pulses.     Heart sounds: Normal heart sounds.  Pulmonary:     Effort: Pulmonary effort is normal.     Breath sounds: Normal breath sounds.  Abdominal:     General: Abdomen is flat.     Palpations: Abdomen is soft.     Comments: No obvious abdominal bruising or signs of abdominal trauma   Musculoskeletal:     Cervical back: Normal range of motion and neck supple.     Comments: No obvious spinal tenderness and no obvious extremity trauma  Skin:    General: Skin is warm.     Capillary Refill: Capillary refill takes less than 2 seconds.  Neurological:     General: No focal deficit present.     Comments: Demented, moving all extremities  Psychiatric:        Mood and Affect: Mood normal.        Behavior: Behavior normal.    ED Results / Procedures / Treatments   Labs (all labs ordered are listed, but only abnormal results are displayed) Labs Reviewed  CBC WITH DIFFERENTIAL/PLATELET  COMPREHENSIVE METABOLIC PANEL  TROPONIN I (HIGH SENSITIVITY)    EKG None  Radiology No results found.  Procedures Procedures   LACERATION REPAIR Performed by: Richardean Canal Authorized by: Richardean Canal Consent: Verbal consent obtained. Risks and benefits: risks, benefits and alternatives were discussed Consent given by: patient Patient identity confirmed: provided demographic data Prepped and Draped in normal sterile fashion Wound explored  Laceration Location: R face   Laceration Length: 5 cm  No Foreign Bodies seen or palpated  Anesthesia: none  Irrigation method: syringe Amount of cleaning: standard  Skin closure: dermabond    Patient tolerance: Patient tolerated the procedure well with no immediate complications.   Medications Ordered in ED Medications  Tdap (BOOSTRIX) injection 0.5 mL (has no administration in time range)    ED Course  I have reviewed the triage vital signs and the nursing notes.  Pertinent labs & imaging results that were available during my care of the patient were reviewed by me and considered in my medical decision making (see chart for details).    MDM Rules/Calculators/A&P                           Eric Zamora is a 75 y.o. male here presenting with fall.  Patient is on Plavix.  We will get CT head and cervical spine  7:28  PM Hg is 11.  Creatinine is 1.5 which is baseline.  Bleeding controlled with Dermabond.  CT head and neck unremarkable. 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 Will dc back to facility. Tdap updated.   Final Clinical Impression(s) / ED Diagnoses Final diagnoses:  None    Rx / DC Orders ED Discharge Orders     None        Charlynne Pander, MD 06/09/21 1929

## 2021-06-09 NOTE — ED Notes (Signed)
RN gave discharge instructions to receiving facility [facility]). Pt transported via [facility]. VSS upon discharge.

## 2021-06-09 NOTE — ED Notes (Signed)
Trauma Response Nurse Note-  Reason for Call / Reason for Trauma activation:   - L2 fall on plavix.  Initial Focused Assessment (If applicable, or please see trauma documentation):  - GCS 14 (baseline due to dementia) - VSS - Lac to R eyebrow  Interventions:  - dermabond to R eyebrow - 20G PIV - trauma labs - CT head and neck   Plan of Care as of this note:  - Awaiting results of scans  Event Summary:   - Pt is from SNF and fell resulting in lac to R forehead. Unwittnessed.  Unable to reach pt's POA at this moment.  [place], [place] [phone]

## 2021-06-09 NOTE — ED Notes (Signed)
[facility] called  

## 2021-06-09 NOTE — Progress Notes (Signed)
Orthopedic Tech Progress Note Patient Details:  Eric Zamora [birth year] [id number]  Patient ID: Eric Zamora, male   DOB: [birth year], 75 y.o.   MRN: [id number]  Lovett Calender 06/09/2021, 8:05 PM Level 2 Trauma not currently needed.

## 2021-06-09 NOTE — ED Triage Notes (Signed)
Pt fell at SNF, he hit his head, is on plavix and has a laceration above his right eyebrow. Pt has mild dementia, he reports no pain, unsure of amount of time pt was on the ground, reports no LOC

## 2021-06-09 NOTE — ED Notes (Signed)
Per MD - delta trop not needed.

## 2021-06-09 NOTE — Discharge Instructions (Signed)
He has a scalp laceration and Dermabond was applied.  We will follow-up in several days.  Expect some ecchymosis of the right side of his face.  His CT scan today did not show any bleeding or acute fracture.  He had a previous neck fracture that is stable  Fall precautions at the facility  See your doctor  Return to ER if you have another fall, severe pain, vomiting, headache

## 2021-09-25 ENCOUNTER — Emergency Department (HOSPITAL_COMMUNITY): Payer: Medicare Other

## 2021-09-25 ENCOUNTER — Emergency Department (HOSPITAL_COMMUNITY)
Admission: EM | Admit: 2021-09-25 | Discharge: 2021-09-26 | Disposition: A | Discharge status: Skilled Nursing Facility | Payer: Medicare Other | Attending: Emergency Medicine | Admitting: Emergency Medicine

## 2021-09-25 DIAGNOSIS — Y9301 Activity, walking, marching and hiking: Secondary | ICD-10-CM | POA: Diagnosis not present

## 2021-09-25 DIAGNOSIS — W1839XA Other fall on same level, initial encounter: Secondary | ICD-10-CM | POA: Insufficient documentation

## 2021-09-25 DIAGNOSIS — R296 Repeated falls: Secondary | ICD-10-CM | POA: Diagnosis not present

## 2021-09-25 DIAGNOSIS — S0083XA Contusion of other part of head, initial encounter: Secondary | ICD-10-CM | POA: Diagnosis not present

## 2021-09-25 DIAGNOSIS — W19XXXA Unspecified fall, initial encounter: Secondary | ICD-10-CM

## 2021-09-25 DIAGNOSIS — Z7982 Long term (current) use of aspirin: Secondary | ICD-10-CM | POA: Diagnosis not present

## 2021-09-25 DIAGNOSIS — F0390 Unspecified dementia without behavioral disturbance: Secondary | ICD-10-CM | POA: Diagnosis not present

## 2021-09-25 DIAGNOSIS — S0990XA Unspecified injury of head, initial encounter: Secondary | ICD-10-CM | POA: Diagnosis present

## 2021-09-25 DIAGNOSIS — R55 Syncope and collapse: Secondary | ICD-10-CM | POA: Diagnosis not present

## 2021-09-25 LAB — COMPREHENSIVE METABOLIC PANEL
ALT: 15 U/L (ref 0–44)
AST: 22 U/L (ref 15–41)
Albumin: 3.3 g/dL — ABNORMAL LOW (ref 3.5–5.0)
Alkaline Phosphatase: 31 U/L — ABNORMAL LOW (ref 38–126)
Anion gap: 7 (ref 5–15)
BUN: 24 mg/dL — ABNORMAL HIGH (ref 8–23)
CO2: 24 mmol/L (ref 22–32)
Calcium: 8.2 mg/dL — ABNORMAL LOW (ref 8.9–10.3)
Chloride: 107 mmol/L (ref 98–111)
Creatinine, Ser: 1.83 mg/dL — ABNORMAL HIGH (ref 0.61–1.24)
GFR, Estimated: 38 mL/min — ABNORMAL LOW (ref >60)
Glucose, Bld: 176 mg/dL — ABNORMAL HIGH (ref 70–99)
Potassium: 4.3 mmol/L (ref 3.5–5.1)
Sodium: 138 mmol/L (ref 135–145)
Total Bilirubin: 0.3 mg/dL (ref 0.3–1.2)
Total Protein: 6.3 g/dL — ABNORMAL LOW (ref 6.5–8.1)

## 2021-09-25 LAB — CBC WITH DIFFERENTIAL/PLATELET
Abs Immature Granulocytes: 0.02 10*3/uL (ref 0.00–0.07)
Basophils Absolute: 0.1 10*3/uL (ref 0.0–0.1)
Basophils Relative: 1 %
Eosinophils Absolute: 0.4 10*3/uL (ref 0.0–0.5)
Eosinophils Relative: 6 %
HCT: 34.2 % — ABNORMAL LOW (ref 39.0–52.0)
Hemoglobin: 11.3 g/dL — ABNORMAL LOW (ref 13.0–17.0)
Immature Granulocytes: 0 %
Lymphocytes Relative: 27 %
Lymphs Abs: 1.8 10*3/uL (ref 0.7–4.0)
MCH: 29.4 pg (ref 26.0–34.0)
MCHC: 33 g/dL (ref 30.0–36.0)
MCV: 88.8 fL (ref 80.0–100.0)
Monocytes Absolute: 1.1 10*3/uL — ABNORMAL HIGH (ref 0.1–1.0)
Monocytes Relative: 17 %
Neutro Abs: 3.4 10*3/uL (ref 1.7–7.7)
Neutrophils Relative %: 49 %
Platelets: 153 10*3/uL (ref 150–400)
RBC: 3.85 MIL/uL — ABNORMAL LOW (ref 4.22–5.81)
RDW: 13.3 % (ref 11.5–15.5)
WBC: 6.8 10*3/uL (ref 4.0–10.5)
nRBC: 0 % (ref 0.0–0.2)

## 2021-09-25 LAB — PROTIME-INR
INR: 1 (ref 0.8–1.2)
Prothrombin Time: 13 seconds (ref 11.4–15.2)

## 2021-09-25 LAB — URINALYSIS, ROUTINE W REFLEX MICROSCOPIC
Bilirubin Urine: NEGATIVE
Glucose, UA: 150 mg/dL — AB
Hgb urine dipstick: NEGATIVE
Ketones, ur: NEGATIVE mg/dL
Leukocytes,Ua: NEGATIVE
Nitrite: NEGATIVE
Protein, ur: NEGATIVE mg/dL
Specific Gravity, Urine: 1.017 (ref 1.005–1.030)
pH: 6 (ref 5.0–8.0)

## 2021-09-25 LAB — CBG MONITORING, ED: Glucose-Capillary: 165 mg/dL — ABNORMAL HIGH (ref 70–99)

## 2021-09-25 LAB — MAGNESIUM: Magnesium: 2.3 mg/dL (ref 1.7–2.4)

## 2021-09-25 MED ORDER — SODIUM CHLORIDE 0.9 % IV SOLN: INTRAVENOUS | Status: DC

## 2021-09-25 NOTE — Progress Notes (Signed)
Chaplain greeted and met with patient briefly upon admission. No spritiaul or emotional distress exhibited and no family present.      09/25/21 1900  Clinical Encounter Type  Visited With Patient  Visit Type Initial  Referral From Nurse  Stress Factors  Patient Stress Factors None identified  Family Stress Factors None identified

## 2021-09-25 NOTE — ED Provider Notes (Signed)
[facility] EMERGENCY DEPARTMENT Provider Note   CSN: [id number] Arrival date & time: 09/25/21  1906     History  Chief Complaint  Patient presents with   Eric Zamora    Eric Zamora is a 76 y.o. male.  Pt is a 76 yo bm with a hx of frequent falls, htn, cm, dyslipidemia, cri, bipolar d/o, and dementia.  Pt lives in a snf and was seen walking down the hall.  He was then found on the ground with some jerking movements to his arms and legs.  He was awake when EMS got to him.  No post-ictal state noted.  No injury to the tongue.  Pt did hit his forehead.  Pt said he feels well.  He denies any pain.  Pt said he remembers falling.  He denies any dizziness or feeling lightheaded.      Home Medications Prior to Admission medications   Medication Sig Start Date End Date Taking? Authorizing Provider  aspirin 81 MG chewable tablet Chew 1 tablet (81 mg total) by mouth daily. 07/10/13  Yes Rai, Ripudeep K, MD  clopidogrel (PLAVIX) 75 MG tablet Take 75 mg by mouth daily. 05/01/21  Yes [provider]  ergocalciferol (VITAMIN D2) 1.25 MG (50000 UT) capsule Take 50,000 Units by mouth every 30 (thirty) days. On the 7 th of every month.   Yes [provider]  furosemide (LASIX) 40 MG tablet Take 1 tablet (40 mg total) by mouth daily. 07/10/13  Yes Rai, Ripudeep K, MD  levETIRAcetam (KEPPRA) 500 MG tablet Take 500 mg by mouth 2 (two) times daily. 05/01/21  Yes [provider]  metoprolol tartrate (LOPRESSOR) 25 MG tablet Take 1 tablet (25 mg total) by mouth 2 (two) times daily. 07/10/13  Yes Rai, Ripudeep K, MD  PARoxetine (PAXIL) 10 MG tablet Take 10 mg by mouth daily. 05/01/21  Yes [provider]  potassium chloride SA (K-DUR,KLOR-CON) 20 MEQ tablet Take 2 tablets (40 mEq total) by mouth daily. 07/10/13  Yes Rai, Ripudeep K, MD  simvastatin (ZOCOR) 20 MG tablet Take 20 mg by mouth at bedtime.   Yes [provider]  Vitamin D, Ergocalciferol,  (DRISDOL) 1.25 MG (50000 UNIT) CAPS capsule Take 50,000 Units by mouth every 7 (seven) days.   Yes [provider]  acetaminophen (TYLENOL) 500 MG tablet Take 500 mg by mouth every 4 (four) hours as needed for moderate pain, headache or fever. Patient not taking: Reported on 09/25/2021    [provider]  magnesium hydroxide (MILK OF MAGNESIA) 400 MG/5ML suspension Take 30 mLs by mouth at bedtime as needed for mild constipation. Patient not taking: Reported on 09/25/2021    [provider]  PARoxetine (PAXIL) 20 MG tablet Take 20 mg by mouth daily. Patient not taking: Reported on 09/25/2021    [provider]      Allergies    Patient has no known allergies.    Review of Systems   Review of Systems  HENT:  Positive for facial swelling.   All other systems reviewed and are negative.  Physical Exam Updated Vital Signs BP (!) 148/78 (BP Location: Left Arm)    Pulse 77    Temp 97.8 F (36.6 C)    Resp 16    SpO2 100%  Physical Exam Vitals and nursing note reviewed.  Constitutional:      Appearance: Normal appearance.  HENT:     Head: Normocephalic.     Right Ear: External ear  normal.     Left Ear: External ear normal.     Nose: Nose normal.     Mouth/Throat:     Mouth: Mucous membranes are moist.     Pharynx: Oropharynx is clear.  Eyes:     Extraocular Movements: Extraocular movements intact.     Conjunctiva/sclera: Conjunctivae normal.     Pupils: Pupils are equal, round, and reactive to light.  Cardiovascular:     Rate and Rhythm: Normal rate and regular rhythm.     Pulses: Normal pulses.     Heart sounds: Normal heart sounds.  Pulmonary:     Effort: Pulmonary effort is normal.     Breath sounds: Normal breath sounds.  Abdominal:     General: Abdomen is flat. Bowel sounds are normal.     Palpations: Abdomen is soft.  Musculoskeletal:        General: Normal range of motion.     Cervical back: Normal range of motion and neck supple.   Skin:    General: Skin is warm.     Capillary Refill: Capillary refill takes less than 2 seconds.  Neurological:     General: No focal deficit present.     Mental Status: He is alert. Mental status is at baseline.  Psychiatric:        Mood and Affect: Mood normal.        Behavior: Behavior normal.        Thought Content: Thought content normal.        Judgment: Judgment normal.    ED Results / Procedures / Treatments   Labs (all labs ordered are listed, but only abnormal results are displayed) Labs Reviewed  COMPREHENSIVE METABOLIC PANEL - Abnormal; Notable for the following components:      Result Value   Glucose, Bld 176 (*)    BUN 24 (*)    Creatinine, Ser 1.83 (*)    Calcium 8.2 (*)    Total Protein 6.3 (*)    Albumin 3.3 (*)    Alkaline Phosphatase 31 (*)    GFR, Estimated 38 (*)    All other components within normal limits  CBC WITH DIFFERENTIAL/PLATELET - Abnormal; Notable for the following components:   RBC 3.85 (*)    Hemoglobin 11.3 (*)    HCT 34.2 (*)    Monocytes Absolute 1.1 (*)    All other components within normal limits  URINALYSIS, ROUTINE W REFLEX MICROSCOPIC - Abnormal; Notable for the following components:   Glucose, UA 150 (*)    All other components within normal limits  CBG MONITORING, ED - Abnormal; Notable for the following components:   Glucose-Capillary 165 (*)    All other components within normal limits  MAGNESIUM  PROTIME-INR    EKG EKG Interpretation  Date/Time:  Thursday September 25 2021 19:26:45 EST Ventricular Rate:  86 PR Interval:  184 QRS Duration: 99 QT Interval:  364 QTC Calculation: 436 R Axis:   68 Text Interpretation: Sinus or ectopic atrial rhythm Repol abnrm suggests ischemia, anterolateral No significant change since last tracing Confirmed by Jacalyn LefevreHaviland, Shulem Mader [phone]([id number]) on 09/25/2021 7:47:32 PM  Radiology CT HEAD WO CONTRAST  Result Date: 09/25/2021 CLINICAL DATA:  Fall on blood thinners EXAM: CT HEAD WITHOUT CONTRAST  CT CERVICAL SPINE WITHOUT CONTRAST TECHNIQUE: Multidetector CT imaging of the head and cervical spine was performed following the standard protocol without intravenous contrast. Multiplanar CT image reconstructions of the cervical spine were also generated. RADIATION DOSE REDUCTION: This exam was performed according to  the departmental dose-optimization program which includes automated exposure control, adjustment of the mA and/or kV according to patient size and/or use of iterative reconstruction technique. COMPARISON:  CT brain and cervical spine 08/13/2020, 06/09/2021 FINDINGS: CT HEAD FINDINGS Brain: No acute territorial infarction, hemorrhage or intracranial mass. 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 Mild atrophy. Small chronic infarct in the right caudate. Stable ventricle size. Vascular: No hyperdense vessels.  Carotid vascular calcification Skull: Normal. Negative for fracture or focal lesion. Sinuses/Orbits: Mucosal thickening in the sinuses Other: Moderate left frontal parietal scalp hematoma CT CERVICAL SPINE FINDINGS Alignment: Stable since previous examinations. 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 Incomplete right posterior arch of C1. Chronic superior endplate deformity T2 and T3. No definite acute fracture is seen. Soft tissues and spinal canal: No prevertebral fluid or swelling. No visible canal hematoma. Disc levels: Multilevel degenerative change with moderate disc space narrowing C5-C6 and C6-C7. Upper chest: Negative. Other: None IMPRESSION: 1. No definite CT evidence for acute intracranial abnormality. Atrophy and small chronic infarcts in the right caudate and left cerebellum 2. No acute osseous abnormality of the cervical spine. 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   On: 09/25/2021 20:37   CT CERVICAL SPINE WO CONTRAST  Result Date: 09/25/2021 CLINICAL DATA:  Fall on  blood thinners EXAM: CT HEAD WITHOUT CONTRAST CT CERVICAL SPINE WITHOUT CONTRAST TECHNIQUE: Multidetector CT imaging of the head and cervical spine was performed following the standard protocol without intravenous contrast. Multiplanar CT image reconstructions of the cervical spine were also generated. RADIATION DOSE REDUCTION: This exam was performed according to the departmental dose-optimization program which includes automated exposure control, adjustment of the mA and/or kV according to patient size and/or use of iterative reconstruction technique. COMPARISON:  CT brain and cervical spine 08/13/2020, 06/09/2021 FINDINGS: CT HEAD FINDINGS Brain: No acute territorial infarction, hemorrhage or intracranial mass. 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 Mild atrophy. Small chronic infarct in the right caudate. Stable ventricle size. Vascular: No hyperdense vessels.  Carotid vascular calcification Skull: Normal. Negative for fracture or focal lesion. Sinuses/Orbits: Mucosal thickening in the sinuses Other: Moderate left frontal parietal scalp hematoma CT CERVICAL SPINE FINDINGS Alignment: Stable since previous examinations. 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 Incomplete right posterior arch of C1. Chronic superior endplate deformity T2 and T3. No definite acute fracture is seen. Soft tissues and spinal canal: No prevertebral fluid or swelling. No visible canal hematoma. Disc levels: Multilevel degenerative change with moderate disc space narrowing C5-C6 and C6-C7. Upper chest: Negative. Other: None IMPRESSION: 1. No definite CT evidence for acute intracranial abnormality. Atrophy and small chronic infarcts in the right caudate and left cerebellum 2. No acute osseous abnormality of the cervical spine. 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   On: 09/25/2021 20:37   DG Pelvis Portable  Result Date:  09/25/2021 CLINICAL DATA:  Fall. EXAM: PORTABLE PELVIS 1-2 VIEWS COMPARISON:  None. FINDINGS: The cortical margins of the bony pelvis are intact. No fracture. Pubic symphysis and sacroiliac joints are congruent. Mild bilateral hip osteoarthritis. Both femoral heads are well-seated in the respective acetabula. IMPRESSION: No pelvic fracture. Electronically Signed   By: Narda Rutherford M.D.   On: 09/25/2021 19:43   DG Chest Port 1 View  Result Date: 09/25/2021 CLINICAL DATA:  Fall EXAM: PORTABLE CHEST  1 VIEW COMPARISON:  10/07/2020 FINDINGS: The heart size and mediastinal contours are within normal limits. Both lungs are clear. The visualized skeletal structures are unremarkable. IMPRESSION: No active disease. Electronically Signed   By: Deatra RobinsonKevin  Herman M.D.   On: 09/25/2021 19:37    Procedures Procedures    Medications Ordered in ED Medications  0.9 %  sodium chloride infusion (has no administration in time range)    ED Course/ Medical Decision Making/ A&P                           Medical Decision Making Amount and/or Complexity of Data Reviewed Labs: ordered. Radiology: ordered.  Risk Prescription drug management.   Pt said he remembers falling, so it does not sound like a seizure.  He has had multiple falls and syncopal evals.  CT scans and x-rays reviewed.  Pt has a soft tissue hematoma and a chronic ununited fx of the left lateral mass of C1.  Pt is able to ambulate without any problems.  Labs show ckd.    I attempted to call his sister [name](Cynthia Waddell), but she did not answer her home or cell phone.  There was no voice mail on either.    Pt is stable for d/c.  Return if worse.  F/u with pcp.        Final Clinical Impression(s) / ED Diagnoses Final diagnoses:  Fall  Traumatic hematoma of forehead, initial encounter    Rx / DC Orders ED Discharge Orders     None         Jacalyn LefevreHaviland, Tennyson Kallen, MD 09/25/21 2315

## 2021-09-25 NOTE — ED Notes (Signed)
Called ptar for pt #7 on the list  

## 2021-09-25 NOTE — ED Notes (Signed)
Patient ambulated with walker with minimal assistance from RN. Patient reports no dizziness or lightheadedness and ambulates comfortably and independently.

## 2021-11-05 ENCOUNTER — Emergency Department (HOSPITAL_COMMUNITY)
Admission: EM | Admit: 2021-11-05 | Discharge: 2021-11-05 | Disposition: A | Discharge status: Skilled Nursing Facility | Payer: Medicare Other | Attending: Emergency Medicine | Admitting: Emergency Medicine

## 2021-11-05 ENCOUNTER — Emergency Department (HOSPITAL_COMMUNITY): Payer: Medicare Other

## 2021-11-05 DIAGNOSIS — S0083XA Contusion of other part of head, initial encounter: Secondary | ICD-10-CM | POA: Insufficient documentation

## 2021-11-05 DIAGNOSIS — Z7901 Long term (current) use of anticoagulants: Secondary | ICD-10-CM | POA: Diagnosis not present

## 2021-11-05 DIAGNOSIS — W19XXXA Unspecified fall, initial encounter: Secondary | ICD-10-CM | POA: Diagnosis not present

## 2021-11-05 DIAGNOSIS — Y92129 Unspecified place in nursing home as the place of occurrence of the external cause: Secondary | ICD-10-CM | POA: Diagnosis not present

## 2021-11-05 DIAGNOSIS — F0390 Unspecified dementia without behavioral disturbance: Secondary | ICD-10-CM | POA: Diagnosis not present

## 2021-11-05 DIAGNOSIS — Z7982 Long term (current) use of aspirin: Secondary | ICD-10-CM | POA: Diagnosis not present

## 2021-11-05 DIAGNOSIS — S0990XA Unspecified injury of head, initial encounter: Secondary | ICD-10-CM | POA: Diagnosis present

## 2021-11-05 LAB — CBC WITH DIFFERENTIAL/PLATELET
Abs Immature Granulocytes: 0.02 10*3/uL (ref 0.00–0.07)
Basophils Absolute: 0.1 10*3/uL (ref 0.0–0.1)
Basophils Relative: 1 %
Eosinophils Absolute: 0.3 10*3/uL (ref 0.0–0.5)
Eosinophils Relative: 4 %
HCT: 39.8 % (ref 39.0–52.0)
Hemoglobin: 12.8 g/dL — ABNORMAL LOW (ref 13.0–17.0)
Immature Granulocytes: 0 %
Lymphocytes Relative: 20 %
Lymphs Abs: 1.4 10*3/uL (ref 0.7–4.0)
MCH: 28.5 pg (ref 26.0–34.0)
MCHC: 32.2 g/dL (ref 30.0–36.0)
MCV: 88.6 fL (ref 80.0–100.0)
Monocytes Absolute: 1 10*3/uL (ref 0.1–1.0)
Monocytes Relative: 15 %
Neutro Abs: 4 10*3/uL (ref 1.7–7.7)
Neutrophils Relative %: 60 %
Platelets: 206 10*3/uL (ref 150–400)
RBC: 4.49 MIL/uL (ref 4.22–5.81)
RDW: 13 % (ref 11.5–15.5)
WBC: 6.7 10*3/uL (ref 4.0–10.5)
nRBC: 0 % (ref 0.0–0.2)

## 2021-11-05 LAB — BASIC METABOLIC PANEL
Anion gap: 7 (ref 5–15)
BUN: 18 mg/dL (ref 8–23)
CO2: 25 mmol/L (ref 22–32)
Calcium: 8.6 mg/dL — ABNORMAL LOW (ref 8.9–10.3)
Chloride: 103 mmol/L (ref 98–111)
Creatinine, Ser: 1.67 mg/dL — ABNORMAL HIGH (ref 0.61–1.24)
GFR, Estimated: 42 mL/min — ABNORMAL LOW (ref >60)
Glucose, Bld: 201 mg/dL — ABNORMAL HIGH (ref 70–99)
Potassium: 4.6 mmol/L (ref 3.5–5.1)
Sodium: 135 mmol/L (ref 135–145)

## 2021-11-05 NOTE — ED Provider Notes (Signed)
[facility] EMERGENCY DEPARTMENT Provider Note   CSN: [id number] Arrival date & time: 11/05/21  0932     History  Chief Complaint  Patient presents with   [name]    Eric Zamora is a 76 y.o. male.  76 yo M with a chief complaint of a fall.  Patient has some dementia at baseline.  Per EMS report the patient had fallen and is at his baseline currently.  Was noted to have a large left frontal hematoma was a bit hypertensive and was sent to the emergency department for evaluation.  He takes Plavix.  He denies any complaint currently.  He is not sure exactly why he fell down.  Level 5 caveat dementia.   Fall      Home Medications Prior to Admission medications   Medication Sig Start Date End Date Taking? Authorizing Provider  acetaminophen (TYLENOL) 500 MG tablet Take 500 mg by mouth every 4 (four) hours as needed for moderate pain, headache or fever. Patient not taking: Reported on 09/25/2021    [provider]  aspirin 81 MG chewable tablet Chew 1 tablet (81 mg total) by mouth daily. 07/10/13   Rai, Delene Ruffini, MD  clopidogrel (PLAVIX) 75 MG tablet Take 75 mg by mouth daily. 05/01/21   [provider]  ergocalciferol (VITAMIN D2) 1.25 MG (50000 UT) capsule Take 50,000 Units by mouth every 30 (thirty) days. On the 7 th of every month.    [provider]  furosemide (LASIX) 40 MG tablet Take 1 tablet (40 mg total) by mouth daily. 07/10/13   Rai, Delene Ruffini, MD  levETIRAcetam (KEPPRA) 500 MG tablet Take 500 mg by mouth 2 (two) times daily. 05/01/21   [provider]  magnesium hydroxide (MILK OF MAGNESIA) 400 MG/5ML suspension Take 30 mLs by mouth at bedtime as needed for mild constipation. Patient not taking: Reported on 09/25/2021    [provider]  metoprolol tartrate (LOPRESSOR) 25 MG tablet Take 1 tablet (25 mg total) by mouth 2 (two) times daily. 07/10/13   Rai, Ripudeep K, MD  PARoxetine (PAXIL) 10 MG tablet Take 10 mg  by mouth daily. 05/01/21   [provider]  PARoxetine (PAXIL) 20 MG tablet Take 20 mg by mouth daily. Patient not taking: Reported on 09/25/2021    [provider]  potassium chloride SA (K-DUR,KLOR-CON) 20 MEQ tablet Take 2 tablets (40 mEq total) by mouth daily. 07/10/13   Rai, Delene Ruffini, MD  simvastatin (ZOCOR) 20 MG tablet Take 20 mg by mouth at bedtime.    [provider]  Vitamin D, Ergocalciferol, (DRISDOL) 1.25 MG (50000 UNIT) CAPS capsule Take 50,000 Units by mouth every 7 (seven) days.    [provider]      Allergies    Patient has no known allergies.    Review of Systems   Review of Systems  Physical Exam Updated Vital Signs BP (!) 160/80    Pulse 63    Temp 98 F (36.7 C) (Oral)    Resp 17    Ht 5\' 9"  (1.753 m)    Wt 75 kg    SpO2 100%    BMI 24.42 kg/m  Physical Exam Vitals and nursing note reviewed.  Constitutional:      Appearance: He is well-developed.  HENT:     Head: Normocephalic.     Comments: Left frontal hematoma no midline spinal tenderness step-offs or deformities able to rotate his head 45 degrees in either direction  without pain. Eyes:     Pupils: Pupils are equal, round, and reactive to light.  Neck:     Vascular: No JVD.  Cardiovascular:     Rate and Rhythm: Normal rate and regular rhythm.     Heart sounds: No murmur heard.   No friction rub. No gallop.  Pulmonary:     Effort: No respiratory distress.     Breath sounds: No wheezing.  Abdominal:     General: There is no distension.     Tenderness: There is no abdominal tenderness. There is no guarding or rebound.  Musculoskeletal:        General: Normal range of motion.     Cervical back: Normal range of motion and neck supple.     Comments: No obvious bony tenderness on palpation from head to toe.  Skin:    Coloration: Skin is not pale.     Findings: No rash.  Neurological:     Mental Status: He is alert and oriented to person, place, and time.   Psychiatric:        Behavior: Behavior normal.    ED Results / Procedures / Treatments   Labs (all labs ordered are listed, but only abnormal results are displayed) Labs Reviewed  CBC WITH DIFFERENTIAL/PLATELET - Abnormal; Notable for the following components:      Result Value   Hemoglobin 12.8 (*)    All other components within normal limits  BASIC METABOLIC PANEL - Abnormal; Notable for the following components:   Glucose, Bld 201 (*)    Creatinine, Ser 1.67 (*)    Calcium 8.6 (*)    GFR, Estimated 42 (*)    All other components within normal limits    EKG None  Radiology CT Head Wo Contrast  Result Date: 11/05/2021 CLINICAL DATA:  Head trauma, minor (Age >= 65y); Neck trauma (Age >= 65y) EXAM: CT HEAD WITHOUT CONTRAST CT CERVICAL SPINE WITHOUT CONTRAST TECHNIQUE: Multidetector CT imaging of the head and cervical spine was performed following the standard protocol without intravenous contrast. Multiplanar CT image reconstructions of the cervical spine were also generated. RADIATION DOSE REDUCTION: This exam was performed according to the departmental dose-optimization program which includes automated exposure control, adjustment of the mA and/or kV according to patient size and/or use of iterative reconstruction technique. COMPARISON:  09/25/2021. FINDINGS: CT HEAD FINDINGS Brain: No evidence of acute large vascular territory infarction, hemorrhage, hydrocephalus, extra-axial collection or mass lesion/mass effect. Mild for age cerebral atrophy. Vascular: Calcific atherosclerosis. Skull: Large left frontal scalp contusion without acute fracture. Sinuses/Orbits: Mild paranasal sinus mucosal thickening without air-fluid levels. Other: No mastoid effusions. CT CERVICAL SPINE FINDINGS Alignment: No substantial sagittal subluxation.  Similar alignment. Skull base and vertebrae: Mild T2 and T3 height loss, unchanged. No new vertebral body height loss. Similar nonunited fracture of the left  lateral mass of C1. Soft tissues and spinal canal: No prevertebral fluid or swelling. No visible canal hematoma. Disc levels: Similar mild to moderate multilevel degenerative change. Upper chest: Visualized lung apices are clear. IMPRESSION: CT head: 1. No evidence of acute intracranial abnormality. 2. Large left frontal scalp contusion without acute fracture. CT cervical spine: 1. No evidence of acute fracture or traumatic malalignment. 2. Similar nonunited fracture of the left lateral mass of C1. 3. Similar mild T2 and T3 height loss. Electronically Signed   By: Feliberto Harts M.D.   On: 11/05/2021 10:18   CT Cervical Spine Wo Contrast  Result Date: 11/05/2021 CLINICAL DATA:  Head trauma,  minor (Age >= 65y); Neck trauma (Age >= 65y) EXAM: CT HEAD WITHOUT CONTRAST CT CERVICAL SPINE WITHOUT CONTRAST TECHNIQUE: Multidetector CT imaging of the head and cervical spine was performed following the standard protocol without intravenous contrast. Multiplanar CT image reconstructions of the cervical spine were also generated. RADIATION DOSE REDUCTION: This exam was performed according to the departmental dose-optimization program which includes automated exposure control, adjustment of the mA and/or kV according to patient size and/or use of iterative reconstruction technique. COMPARISON:  09/25/2021. FINDINGS: CT HEAD FINDINGS Brain: No evidence of acute large vascular territory infarction, hemorrhage, hydrocephalus, extra-axial collection or mass lesion/mass effect. Mild for age cerebral atrophy. Vascular: Calcific atherosclerosis. Skull: Large left frontal scalp contusion without acute fracture. Sinuses/Orbits: Mild paranasal sinus mucosal thickening without air-fluid levels. Other: No mastoid effusions. CT CERVICAL SPINE FINDINGS Alignment: No substantial sagittal subluxation.  Similar alignment. Skull base and vertebrae: Mild T2 and T3 height loss, unchanged. No new vertebral body height loss. Similar nonunited  fracture of the left lateral mass of C1. Soft tissues and spinal canal: No prevertebral fluid or swelling. No visible canal hematoma. Disc levels: Similar mild to moderate multilevel degenerative change. Upper chest: Visualized lung apices are clear. IMPRESSION: CT head: 1. No evidence of acute intracranial abnormality. 2. Large left frontal scalp contusion without acute fracture. CT cervical spine: 1. No evidence of acute fracture or traumatic malalignment. 2. Similar nonunited fracture of the left lateral mass of C1. 3. Similar mild T2 and T3 height loss. Electronically Signed   By: Feliberto Harts M.D.   On: 11/05/2021 10:18    Procedures Procedures    Medications Ordered in ED Medications - No data to display  ED Course/ Medical Decision Making/ A&P                           Medical Decision Making Amount and/or Complexity of Data Reviewed Labs: ordered. Radiology: ordered. ECG/medicine tests: ordered.   76 yo M with a chief complaint of an unwitnessed fall.  The patient has no complaints but has a large left frontal hematoma.  He is on Plavix arrived here as a level 2 trauma.  Will obtain a CT of the head and C-spine basic blood work and EKG as the patient is not sure exactly why he fell.  His blood work is resulted without significant anemia no significant electrolyte abnormality no change to his baseline renal function.  CT scan of the head and C-spine interpreted by me without obvious fracture or intracranial hemorrhage.  Radiology read negative.  We will discharge the patient home.  PCP follow-up.  11:33 AM:  I have discussed the diagnosis/risks/treatment options with the patient.  Evaluation and diagnostic testing in the emergency department does not suggest an emergent condition requiring admission or immediate intervention beyond what has been performed at this time.  They will follow up with  PCP. We also discussed returning to the ED immediately if new or worsening sx occur. We  discussed the sx which are most concerning (e.g., sudden worsening pain, fever, inability to tolerate by mouth) that necessitate immediate return. Medications administered to the patient during their visit and any new prescriptions provided to the patient are listed below.  Medications given during this visit Medications - No data to display   The patient appears reasonably screen and/or stabilized for discharge and I doubt any other medical condition or other [facility] requiring further screening, evaluation, or treatment in the ED at  this time prior to discharge.          Final Clinical Impression(s) / ED Diagnoses Final diagnoses:  Traumatic hematoma of forehead, initial encounter    Rx / DC Orders ED Discharge Orders     None         Melene PlanFloyd, Eric Brazee, DO 11/05/21 1133

## 2021-11-05 NOTE — ED Notes (Signed)
Trauma Response Nurse Documentation ? ? ?Eric Zamora is a 76 y.o. male arriving to [facility] ED via [facility] ? ?On Eliquis (apixaban) daily. Trauma was activated as a Level 2 by Charge nurse based on the following trauma criteria Elderly patients > 65 with head trauma on anti-coagulation (excluding ASA). Trauma team at the bedside on patient arrival. Patient cleared for CT by Dr. Adela Lank. Patient to CT with team. GCS 14. This is patient's baseline-  ? ?History  ? Past Medical History:  ?Diagnosis Date  ? Alzheimer disease (HCC)   ? Bipolar affective disorder (HCC)   ? Chronic renal insufficiency   ? Dementia (HCC)   ? Dyslipidemia   ? HTN (hypertension)   ? 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 gradient on TEE  ? Syncopal episodes   ?  ? Past Surgical History:  ?Procedure Laterality Date  ? LEFT HEART CATHETERIZATION WITH CORONARY ANGIOGRAM N/A 07/06/2013  ? Procedure: LEFT HEART CATHETERIZATION WITH CORONARY ANGIOGRAM;  Surgeon: Marykay Lex, MD;  Location: [facility] CATH LAB;  Service: Cardiovascular;  Laterality: N/A;  ?  ? ? ? ?Initial Focused Assessment (If applicable, or please see trauma documentation): ?From a SNF - fall on eliquis-- large hematoma to left side of forehead. Pt has some confusion at baseline. No other injuries noted.  ? ? ?CT's Completed:   ?CT Head and CT C-Spine  ? ?Interventions:  ?Labs ?CT ? ? ?Event Summary: ?Pt fell - unwitnessed this am, Large hematoma to left side of forehead. From [facility] SNF ? ? ? ?Bedside handoff with ED RN Myriam Jacobson, RN.   ? ?Hewitt Shorts  ?Trauma Response RN ? ?Please call TRN at [phone] for further assistance. ? ? ?

## 2021-11-05 NOTE — ED Triage Notes (Signed)
Patient with history of dementia BIB [facility] from [place] after unwitnessed fall at approximately 0830 this morning. Patient alert, oriented to self. Large hmatoma to left forehead, on plavix.  ?

## 2021-11-05 NOTE — Progress Notes (Signed)
Orthopedic Tech Progress Note ?Patient Details:  ?Eric Zamora ?06-22-1946 ?[id number] ? ?Patient ID: Eric Zamora, male   DOB: October 28, 1945, 76 y.o.   MRN: [id number] ? ?[name] ?11/05/2021, 9:39 AM ?Responded to level 2 trauma. ?

## 2021-11-05 NOTE — Progress Notes (Signed)
Responded to page to support patient that fell on thinners. Patient is alert and communicating with staff.  Chaplain available as needed. ? ?Fae Pippin, [facility], Pager [phone]   ?

## 2021-11-05 NOTE — Discharge Instructions (Signed)
Your CT scan of your head and neck did not show any broken bones or any bleeding inside the skull.  You do have a fairly large collection of blood underneath the skin on the left side this should get better on its own.  Please follow-up with your family doctor in the office. ?

## 2023-04-15 ENCOUNTER — Other Ambulatory Visit: Payer: Self-pay

## 2023-04-15 ENCOUNTER — Emergency Department (HOSPITAL_COMMUNITY)
Admission: EM | Admit: 2023-04-15 | Discharge: 2023-04-16 | Disposition: A | Discharge status: Home / Self Care | Payer: 59 | Attending: Emergency Medicine | Admitting: Emergency Medicine

## 2023-04-15 DIAGNOSIS — F0390 Unspecified dementia without behavioral disturbance: Secondary | ICD-10-CM | POA: Diagnosis not present

## 2023-04-15 DIAGNOSIS — Z0000 Encounter for general adult medical examination without abnormal findings: Secondary | ICD-10-CM | POA: Diagnosis not present

## 2023-04-15 DIAGNOSIS — Z79899 Other long term (current) drug therapy: Secondary | ICD-10-CM | POA: Diagnosis not present

## 2023-04-15 DIAGNOSIS — Y9301 Activity, walking, marching and hiking: Secondary | ICD-10-CM | POA: Diagnosis not present

## 2023-04-15 DIAGNOSIS — Y92129 Unspecified place in nursing home as the place of occurrence of the external cause: Secondary | ICD-10-CM | POA: Insufficient documentation

## 2023-04-15 DIAGNOSIS — I10 Essential (primary) hypertension: Secondary | ICD-10-CM | POA: Diagnosis not present

## 2023-04-15 DIAGNOSIS — W19XXXA Unspecified fall, initial encounter: Secondary | ICD-10-CM

## 2023-04-15 DIAGNOSIS — Z7982 Long term (current) use of aspirin: Secondary | ICD-10-CM | POA: Diagnosis not present

## 2023-04-15 NOTE — ED Provider Notes (Signed)
[place] EMERGENCY DEPARTMENT AT [facility] Provider Note   CSN: [id number] Arrival date & time: 04/15/23  2038     History  Chief Complaint  Patient presents with   Fall    Patient to ED via EMS with complaint of witnessed fall. Patient takes plavix. Patient is reportedly at baseline with history of Alzheimer's. Patient has no visible injuries. Patient has no acute distress and denies pain at this time.    Eric Zamora is a 77 y.o. male.  Patient is a 77 year old male with a history of hypertension, obstructive cardiomyopathy, bipolar disease, renal insufficiency, dementia who lives at a facility and was witnessed today falling when he was walking with his walker.  There was no report of the patient hitting his head or losing consciousness.  Patient does take Plavix.  At bedside patient states that nothing hurts.  He does not have a headache or neck pain.  He does report he fell down but he feels normal.  The history is provided by the patient.  Fall       Home Medications Prior to Admission medications   Medication Sig Start Date End Date Taking? Authorizing Provider  acetaminophen (TYLENOL) 500 MG tablet Take 500 mg by mouth every 4 (four) hours as needed for moderate pain, headache or fever. Patient not taking: Reported on 09/25/2021    [provider]  aspirin 81 MG chewable tablet Chew 1 tablet (81 mg total) by mouth daily. 07/10/13   Rai, Delene Ruffini, MD  clopidogrel (PLAVIX) 75 MG tablet Take 75 mg by mouth daily. 05/01/21   [provider]  ergocalciferol (VITAMIN D2) 1.25 MG (50000 UT) capsule Take 50,000 Units by mouth every 30 (thirty) days. On the 7 th of every month.    [provider]  furosemide (LASIX) 40 MG tablet Take 1 tablet (40 mg total) by mouth daily. 07/10/13   Rai, Delene Ruffini, MD  levETIRAcetam (KEPPRA) 500 MG tablet Take 500 mg by mouth 2 (two) times daily. 05/01/21   [provider]  magnesium hydroxide  (MILK OF MAGNESIA) 400 MG/5ML suspension Take 30 mLs by mouth at bedtime as needed for mild constipation. Patient not taking: Reported on 09/25/2021    [provider]  metoprolol tartrate (LOPRESSOR) 25 MG tablet Take 1 tablet (25 mg total) by mouth 2 (two) times daily. 07/10/13   Rai, Ripudeep K, MD  PARoxetine (PAXIL) 10 MG tablet Take 10 mg by mouth daily. 05/01/21   [provider]  PARoxetine (PAXIL) 20 MG tablet Take 20 mg by mouth daily. Patient not taking: Reported on 09/25/2021    [provider]  potassium chloride SA (K-DUR,KLOR-CON) 20 MEQ tablet Take 2 tablets (40 mEq total) by mouth daily. 07/10/13   Rai, Delene Ruffini, MD  simvastatin (ZOCOR) 20 MG tablet Take 20 mg by mouth at bedtime.    [provider]  Vitamin D, Ergocalciferol, (DRISDOL) 1.25 MG (50000 UNIT) CAPS capsule Take 50,000 Units by mouth every 7 (seven) days.    [provider]      Allergies    Patient has no known allergies.    Review of Systems   Review of Systems  Physical Exam Updated Vital Signs BP 132/77 (BP Location: Left Arm)   Pulse 81   Temp 98.5 F (36.9 C) (Oral)   Resp 16   Ht 5\' 9"  (1.753 m)   Wt 75 kg   SpO2 100%   BMI 24.42 kg/m  Physical  Exam Vitals and nursing note reviewed.  Constitutional:      General: He is not in acute distress.    Appearance: He is well-developed.  HENT:     Head: Normocephalic and atraumatic.  Eyes:     Conjunctiva/sclera: Conjunctivae normal.     Pupils: Pupils are equal, round, and reactive to light.  Cardiovascular:     Rate and Rhythm: Normal rate and regular rhythm.     Heart sounds: No murmur heard. Pulmonary:     Effort: Pulmonary effort is normal. No respiratory distress.     Breath sounds: Normal breath sounds. No wheezing or rales.  Abdominal:     General: There is no distension.     Palpations: Abdomen is soft.     Tenderness: There is no abdominal tenderness. There is no guarding or rebound.   Musculoskeletal:        General: No tenderness. Normal range of motion.     Cervical back: Normal range of motion and neck supple. No tenderness. No spinous process tenderness or muscular tenderness.  Skin:    General: Skin is warm and dry.     Findings: No erythema or rash.  Neurological:     Mental Status: He is alert.     Sensory: No sensory deficit.     Motor: No weakness.     Comments: Oriented to person and place.  Patient is able to lift his legs without any difficulty and his arms.  He follows commands appropriately.  Psychiatric:        Behavior: Behavior normal.     ED Results / Procedures / Treatments   Labs (all labs ordered are listed, but only abnormal results are displayed) Labs Reviewed - No data to display  EKG None  Radiology No results found.  Procedures Procedures    Medications Ordered in ED Medications - No data to display  ED Course/ Medical Decision Making/ A&P                                 Medical Decision Making  Patient presenting today after a witnessed fall at his living facility.  There was no report of patient losing consciousness or hitting his head.  He does take Plavix.  Facility reported he was at baseline.  Here patient has no signs of injury he is moving all extremities without difficulty.  He is mentating at his baseline per prior records.  No signs of injury to the head.  At this time patient does not appear to need any further imaging or testing.  He is stable for discharge home.        Final Clinical Impression(s) / ED Diagnoses Final diagnoses:  Fall, initial encounter    Rx / DC Orders ED Discharge Orders     None         Gwyneth Sprout, MD 04/15/23 2206

## 2023-04-15 NOTE — ED Notes (Signed)
Ptar called, "3 one on the list"

## 2023-04-15 NOTE — Discharge Instructions (Signed)
No signs of injury today.  If he develops new confusion, vomiting, recurrent falls return to the emergency room

## 2023-06-15 ENCOUNTER — Inpatient Hospital Stay (HOSPITAL_COMMUNITY)
Admission: EM | Admit: 2023-06-15 | Discharge: 2023-08-02 | DRG: 853 | Disposition: A | Discharge status: Skilled Nursing Facility | Payer: Medicare (Managed Care) | Attending: Family Medicine | Admitting: Family Medicine

## 2023-06-15 ENCOUNTER — Emergency Department (HOSPITAL_COMMUNITY): Payer: Medicare (Managed Care)

## 2023-06-15 ENCOUNTER — Other Ambulatory Visit: Payer: Self-pay

## 2023-06-15 ENCOUNTER — Encounter (HOSPITAL_COMMUNITY): Payer: Self-pay

## 2023-06-15 DIAGNOSIS — I252 Old myocardial infarction: Secondary | ICD-10-CM

## 2023-06-15 DIAGNOSIS — I10 Essential (primary) hypertension: Secondary | ICD-10-CM | POA: Diagnosis not present

## 2023-06-15 DIAGNOSIS — N179 Acute kidney failure, unspecified: Secondary | ICD-10-CM | POA: Diagnosis not present

## 2023-06-15 DIAGNOSIS — G309 Alzheimer's disease, unspecified: Secondary | ICD-10-CM | POA: Diagnosis present

## 2023-06-15 DIAGNOSIS — I421 Obstructive hypertrophic cardiomyopathy: Secondary | ICD-10-CM | POA: Diagnosis present

## 2023-06-15 DIAGNOSIS — E1122 Type 2 diabetes mellitus with diabetic chronic kidney disease: Secondary | ICD-10-CM | POA: Diagnosis present

## 2023-06-15 DIAGNOSIS — D6869 Other thrombophilia: Secondary | ICD-10-CM | POA: Diagnosis not present

## 2023-06-15 DIAGNOSIS — Z7902 Long term (current) use of antithrombotics/antiplatelets: Secondary | ICD-10-CM

## 2023-06-15 DIAGNOSIS — I639 Cerebral infarction, unspecified: Secondary | ICD-10-CM | POA: Diagnosis not present

## 2023-06-15 DIAGNOSIS — E1165 Type 2 diabetes mellitus with hyperglycemia: Secondary | ICD-10-CM | POA: Insufficient documentation

## 2023-06-15 DIAGNOSIS — E669 Obesity, unspecified: Secondary | ICD-10-CM | POA: Diagnosis present

## 2023-06-15 DIAGNOSIS — E8809 Other disorders of plasma-protein metabolism, not elsewhere classified: Secondary | ICD-10-CM | POA: Diagnosis present

## 2023-06-15 DIAGNOSIS — R0902 Hypoxemia: Secondary | ICD-10-CM | POA: Diagnosis not present

## 2023-06-15 DIAGNOSIS — Z1152 Encounter for screening for COVID-19: Secondary | ICD-10-CM

## 2023-06-15 DIAGNOSIS — G8194 Hemiplegia, unspecified affecting left nondominant side: Secondary | ICD-10-CM | POA: Diagnosis not present

## 2023-06-15 DIAGNOSIS — I130 Hypertensive heart and chronic kidney disease with heart failure and stage 1 through stage 4 chronic kidney disease, or unspecified chronic kidney disease: Secondary | ICD-10-CM | POA: Diagnosis present

## 2023-06-15 DIAGNOSIS — R1310 Dysphagia, unspecified: Secondary | ICD-10-CM | POA: Diagnosis not present

## 2023-06-15 DIAGNOSIS — R4182 Altered mental status, unspecified: Secondary | ICD-10-CM | POA: Diagnosis not present

## 2023-06-15 DIAGNOSIS — J690 Pneumonitis due to inhalation of food and vomit: Secondary | ICD-10-CM | POA: Diagnosis not present

## 2023-06-15 DIAGNOSIS — A419 Sepsis, unspecified organism: Secondary | ICD-10-CM | POA: Diagnosis not present

## 2023-06-15 DIAGNOSIS — E876 Hypokalemia: Secondary | ICD-10-CM | POA: Diagnosis not present

## 2023-06-15 DIAGNOSIS — I700 Atherosclerosis of aorta: Secondary | ICD-10-CM | POA: Diagnosis present

## 2023-06-15 DIAGNOSIS — Z7982 Long term (current) use of aspirin: Secondary | ICD-10-CM

## 2023-06-15 DIAGNOSIS — G40909 Epilepsy, unspecified, not intractable, without status epilepticus: Secondary | ICD-10-CM | POA: Diagnosis present

## 2023-06-15 DIAGNOSIS — R5381 Other malaise: Secondary | ICD-10-CM | POA: Diagnosis present

## 2023-06-15 DIAGNOSIS — Z79899 Other long term (current) drug therapy: Secondary | ICD-10-CM

## 2023-06-15 DIAGNOSIS — E1110 Type 2 diabetes mellitus with ketoacidosis without coma: Secondary | ICD-10-CM | POA: Diagnosis present

## 2023-06-15 DIAGNOSIS — D649 Anemia, unspecified: Secondary | ICD-10-CM | POA: Diagnosis not present

## 2023-06-15 DIAGNOSIS — B370 Candidal stomatitis: Secondary | ICD-10-CM | POA: Diagnosis present

## 2023-06-15 DIAGNOSIS — K529 Noninfective gastroenteritis and colitis, unspecified: Secondary | ICD-10-CM | POA: Diagnosis not present

## 2023-06-15 DIAGNOSIS — R29706 NIHSS score 6: Secondary | ICD-10-CM | POA: Diagnosis not present

## 2023-06-15 DIAGNOSIS — I080 Rheumatic disorders of both mitral and aortic valves: Secondary | ICD-10-CM | POA: Diagnosis present

## 2023-06-15 DIAGNOSIS — I4891 Unspecified atrial fibrillation: Secondary | ICD-10-CM | POA: Diagnosis not present

## 2023-06-15 DIAGNOSIS — N170 Acute kidney failure with tubular necrosis: Secondary | ICD-10-CM | POA: Diagnosis not present

## 2023-06-15 DIAGNOSIS — I5032 Chronic diastolic (congestive) heart failure: Secondary | ICD-10-CM | POA: Diagnosis present

## 2023-06-15 DIAGNOSIS — Z66 Do not resuscitate: Secondary | ICD-10-CM | POA: Diagnosis not present

## 2023-06-15 DIAGNOSIS — R0603 Acute respiratory distress: Secondary | ICD-10-CM | POA: Diagnosis present

## 2023-06-15 DIAGNOSIS — R1312 Dysphagia, oropharyngeal phase: Secondary | ICD-10-CM | POA: Diagnosis not present

## 2023-06-15 DIAGNOSIS — I63411 Cerebral infarction due to embolism of right middle cerebral artery: Secondary | ICD-10-CM | POA: Diagnosis not present

## 2023-06-15 DIAGNOSIS — E782 Mixed hyperlipidemia: Secondary | ICD-10-CM | POA: Diagnosis not present

## 2023-06-15 DIAGNOSIS — E871 Hypo-osmolality and hyponatremia: Secondary | ICD-10-CM | POA: Diagnosis not present

## 2023-06-15 DIAGNOSIS — Z6831 Body mass index (BMI) 31.0-31.9, adult: Secondary | ICD-10-CM

## 2023-06-15 DIAGNOSIS — R29810 Facial weakness: Secondary | ICD-10-CM | POA: Diagnosis not present

## 2023-06-15 DIAGNOSIS — E870 Hyperosmolality and hypernatremia: Secondary | ICD-10-CM | POA: Diagnosis present

## 2023-06-15 DIAGNOSIS — R233 Spontaneous ecchymoses: Secondary | ICD-10-CM | POA: Diagnosis not present

## 2023-06-15 DIAGNOSIS — F0283 Dementia in other diseases classified elsewhere, unspecified severity, with mood disturbance: Secondary | ICD-10-CM | POA: Diagnosis present

## 2023-06-15 DIAGNOSIS — I63511 Cerebral infarction due to unspecified occlusion or stenosis of right middle cerebral artery: Secondary | ICD-10-CM | POA: Diagnosis not present

## 2023-06-15 DIAGNOSIS — R6520 Severe sepsis without septic shock: Secondary | ICD-10-CM | POA: Diagnosis not present

## 2023-06-15 DIAGNOSIS — I2510 Atherosclerotic heart disease of native coronary artery without angina pectoris: Secondary | ICD-10-CM | POA: Diagnosis not present

## 2023-06-15 DIAGNOSIS — G936 Cerebral edema: Secondary | ICD-10-CM | POA: Diagnosis present

## 2023-06-15 DIAGNOSIS — K567 Ileus, unspecified: Secondary | ICD-10-CM | POA: Diagnosis not present

## 2023-06-15 DIAGNOSIS — M7989 Other specified soft tissue disorders: Secondary | ICD-10-CM | POA: Diagnosis not present

## 2023-06-15 DIAGNOSIS — F319 Bipolar disorder, unspecified: Secondary | ICD-10-CM | POA: Diagnosis present

## 2023-06-15 DIAGNOSIS — G9341 Metabolic encephalopathy: Secondary | ICD-10-CM | POA: Diagnosis present

## 2023-06-15 DIAGNOSIS — F3130 Bipolar disorder, current episode depressed, mild or moderate severity, unspecified: Secondary | ICD-10-CM | POA: Diagnosis present

## 2023-06-15 DIAGNOSIS — N281 Cyst of kidney, acquired: Secondary | ICD-10-CM | POA: Diagnosis present

## 2023-06-15 DIAGNOSIS — R7401 Elevation of levels of liver transaminase levels: Secondary | ICD-10-CM | POA: Diagnosis not present

## 2023-06-15 DIAGNOSIS — K611 Rectal abscess: Secondary | ICD-10-CM | POA: Diagnosis not present

## 2023-06-15 DIAGNOSIS — E11649 Type 2 diabetes mellitus with hypoglycemia without coma: Secondary | ICD-10-CM | POA: Diagnosis not present

## 2023-06-15 DIAGNOSIS — E860 Dehydration: Secondary | ICD-10-CM | POA: Diagnosis present

## 2023-06-15 DIAGNOSIS — Z86718 Personal history of other venous thrombosis and embolism: Secondary | ICD-10-CM

## 2023-06-15 DIAGNOSIS — R32 Unspecified urinary incontinence: Secondary | ICD-10-CM | POA: Diagnosis present

## 2023-06-15 DIAGNOSIS — R296 Repeated falls: Secondary | ICD-10-CM | POA: Diagnosis present

## 2023-06-15 DIAGNOSIS — K625 Hemorrhage of anus and rectum: Secondary | ICD-10-CM | POA: Diagnosis not present

## 2023-06-15 DIAGNOSIS — I2583 Coronary atherosclerosis due to lipid rich plaque: Secondary | ICD-10-CM | POA: Diagnosis not present

## 2023-06-15 DIAGNOSIS — Z515 Encounter for palliative care: Secondary | ICD-10-CM | POA: Diagnosis not present

## 2023-06-15 DIAGNOSIS — Z7901 Long term (current) use of anticoagulants: Secondary | ICD-10-CM

## 2023-06-15 DIAGNOSIS — Z7189 Other specified counseling: Secondary | ICD-10-CM | POA: Diagnosis not present

## 2023-06-15 DIAGNOSIS — Z751 Person awaiting admission to adequate facility elsewhere: Secondary | ICD-10-CM

## 2023-06-15 DIAGNOSIS — R197 Diarrhea, unspecified: Secondary | ICD-10-CM | POA: Diagnosis not present

## 2023-06-15 DIAGNOSIS — I5033 Acute on chronic diastolic (congestive) heart failure: Secondary | ICD-10-CM | POA: Diagnosis present

## 2023-06-15 DIAGNOSIS — A530 Latent syphilis, unspecified as early or late: Secondary | ICD-10-CM | POA: Diagnosis present

## 2023-06-15 DIAGNOSIS — N1832 Chronic kidney disease, stage 3b: Secondary | ICD-10-CM | POA: Diagnosis present

## 2023-06-15 DIAGNOSIS — E46 Unspecified protein-calorie malnutrition: Secondary | ICD-10-CM | POA: Diagnosis present

## 2023-06-15 DIAGNOSIS — E861 Hypovolemia: Secondary | ICD-10-CM | POA: Diagnosis present

## 2023-06-15 DIAGNOSIS — I4819 Other persistent atrial fibrillation: Secondary | ICD-10-CM | POA: Diagnosis not present

## 2023-06-15 DIAGNOSIS — R569 Unspecified convulsions: Secondary | ICD-10-CM | POA: Diagnosis not present

## 2023-06-15 LAB — URINALYSIS, W/ REFLEX TO CULTURE (INFECTION SUSPECTED)
Bacteria, UA: NONE SEEN
Bilirubin Urine: NEGATIVE
Glucose, UA: >=500 mg/dL — AB
Ketones, ur: NEGATIVE mg/dL
Leukocytes,Ua: NEGATIVE
Nitrite: NEGATIVE
Protein, ur: 100 mg/dL — AB
Specific Gravity, Urine: 1.018 (ref 1.005–1.030)
pH: 5 (ref 5.0–8.0)

## 2023-06-15 LAB — CBC WITH DIFFERENTIAL/PLATELET
Abs Immature Granulocytes: 0.16 10*3/uL — ABNORMAL HIGH (ref 0.00–0.07)
Basophils Absolute: 0.1 10*3/uL (ref 0.0–0.1)
Basophils Relative: 1 %
Eosinophils Absolute: 0 10*3/uL (ref 0.0–0.5)
Eosinophils Relative: 0 %
HCT: 35.1 % — ABNORMAL LOW (ref 39.0–52.0)
Hemoglobin: 11.8 g/dL — ABNORMAL LOW (ref 13.0–17.0)
Immature Granulocytes: 1 %
Lymphocytes Relative: 9 %
Lymphs Abs: 1.5 10*3/uL (ref 0.7–4.0)
MCH: 29.3 pg (ref 26.0–34.0)
MCHC: 33.6 g/dL (ref 30.0–36.0)
MCV: 87.1 fL (ref 80.0–100.0)
Monocytes Absolute: 3.2 10*3/uL — ABNORMAL HIGH (ref 0.1–1.0)
Monocytes Relative: 19 %
Neutro Abs: 12.1 10*3/uL — ABNORMAL HIGH (ref 1.7–7.7)
Neutrophils Relative %: 70 %
Platelets: 198 10*3/uL (ref 150–400)
RBC: 4.03 MIL/uL — ABNORMAL LOW (ref 4.22–5.81)
RDW: 12.7 % (ref 11.5–15.5)
WBC: 17.1 10*3/uL — ABNORMAL HIGH (ref 4.0–10.5)
nRBC: 0 % (ref 0.0–0.2)

## 2023-06-15 LAB — BLOOD GAS, VENOUS
Acid-base deficit: 2.9 mmol/L — ABNORMAL HIGH (ref 0.0–2.0)
Bicarbonate: 22 mmol/L (ref 20.0–28.0)
O2 Saturation: 48.8 %
Patient temperature: 37
pCO2, Ven: 38 mm[Hg] — ABNORMAL LOW (ref 44–60)
pH, Ven: 7.37 (ref 7.25–7.43)
pO2, Ven: 31 mm[Hg] — CL (ref 32–45)

## 2023-06-15 LAB — COMPREHENSIVE METABOLIC PANEL
ALT: 36 U/L (ref 0–44)
AST: 51 U/L — ABNORMAL HIGH (ref 15–41)
Albumin: 3.6 g/dL (ref 3.5–5.0)
Alkaline Phosphatase: 51 U/L (ref 38–126)
Anion gap: 13 (ref 5–15)
BUN: 38 mg/dL — ABNORMAL HIGH (ref 8–23)
CO2: 20 mmol/L — ABNORMAL LOW (ref 22–32)
Calcium: 8.9 mg/dL (ref 8.9–10.3)
Chloride: 97 mmol/L — ABNORMAL LOW (ref 98–111)
Creatinine, Ser: 1.99 mg/dL — ABNORMAL HIGH (ref 0.61–1.24)
GFR, Estimated: 34 mL/min — ABNORMAL LOW (ref >60)
Glucose, Bld: 286 mg/dL — ABNORMAL HIGH (ref 70–99)
Potassium: 4.9 mmol/L (ref 3.5–5.1)
Sodium: 130 mmol/L — ABNORMAL LOW (ref 135–145)
Total Bilirubin: 1 mg/dL (ref 0.3–1.2)
Total Protein: 9.1 g/dL — ABNORMAL HIGH (ref 6.5–8.1)

## 2023-06-15 LAB — TSH: TSH: 1.976 u[IU]/mL (ref 0.350–4.500)

## 2023-06-15 LAB — HEMOGLOBIN A1C
Hgb A1c MFr Bld: 9.4 % — ABNORMAL HIGH (ref 4.8–5.6)
Mean Plasma Glucose: 223.08 mg/dL

## 2023-06-15 LAB — GLUCOSE, CAPILLARY
Glucose-Capillary: 209 mg/dL — ABNORMAL HIGH (ref 70–99)
Glucose-Capillary: 155 mg/dL — ABNORMAL HIGH (ref 70–99)

## 2023-06-15 LAB — MAGNESIUM: Magnesium: 2.5 mg/dL — ABNORMAL HIGH (ref 1.7–2.4)

## 2023-06-15 LAB — BRAIN NATRIURETIC PEPTIDE: B Natriuretic Peptide: 1193.2 pg/mL — ABNORMAL HIGH (ref 0.0–100.0)

## 2023-06-15 LAB — I-STAT CG4 LACTIC ACID, ED
Lactic Acid, Venous: 2.9 mmol/L (ref 0.5–1.9)
Lactic Acid, Venous: 1.9 mmol/L (ref 0.5–1.9)

## 2023-06-15 LAB — PROTIME-INR
INR: 1.2 (ref 0.8–1.2)
Prothrombin Time: 15.8 s — ABNORMAL HIGH (ref 11.4–15.2)

## 2023-06-15 LAB — MRSA NEXT GEN BY PCR, NASAL: MRSA by PCR Next Gen: DETECTED — AB

## 2023-06-15 LAB — APTT: aPTT: 22 s — ABNORMAL LOW (ref 24–36)

## 2023-06-15 LAB — BETA-HYDROXYBUTYRIC ACID: Beta-Hydroxybutyric Acid: 0.58 mmol/L — ABNORMAL HIGH (ref 0.05–0.27)

## 2023-06-15 MED ORDER — SODIUM CHLORIDE 0.9 % IV SOLN: 2.0000 g | INTRAVENOUS | Status: DC

## 2023-06-15 MED ORDER — SODIUM CHLORIDE 0.9 % IV SOLN
2.0000 g | Freq: Two times a day (BID) | INTRAVENOUS | Status: DC
  Administered 2023-06-15 – 2023-06-17 (×4): 2 g via INTRAVENOUS
  Filled 2023-06-15 (×4): qty 12.5

## 2023-06-15 MED ORDER — ACETAMINOPHEN 325 MG PO TABS
650.0000 mg | ORAL_TABLET | Freq: Four times a day (QID) | ORAL | Status: DC | PRN
  Administered 2023-06-16 – 2023-06-17 (×2): 650 mg via ORAL
  Filled 2023-06-15 (×2): qty 2

## 2023-06-15 MED ORDER — ALBUTEROL SULFATE (2.5 MG/3ML) 0.083% IN NEBU
2.5000 mg | INHALATION_SOLUTION | RESPIRATORY_TRACT | Status: DC | PRN
  Administered 2023-06-16 – 2023-06-17 (×3): 2.5 mg via RESPIRATORY_TRACT
  Filled 2023-06-15 (×3): qty 3

## 2023-06-15 MED ORDER — ENOXAPARIN SODIUM 40 MG/0.4ML IJ SOSY
40.0000 mg | PREFILLED_SYRINGE | INTRAMUSCULAR | Status: DC
  Administered 2023-06-15: 40 mg via SUBCUTANEOUS
  Filled 2023-06-15: qty 0.4

## 2023-06-15 MED ORDER — INSULIN ASPART 100 UNIT/ML IJ SOLN
0.0000 [IU] | Freq: Three times a day (TID) | INTRAMUSCULAR | Status: DC
  Administered 2023-06-15 – 2023-06-16 (×2): 5 [IU] via SUBCUTANEOUS
  Administered 2023-06-16: 8 [IU] via SUBCUTANEOUS
  Filled 2023-06-15: qty 0.15

## 2023-06-15 MED ORDER — KETOROLAC TROMETHAMINE 15 MG/ML IJ SOLN: 15.0000 mg | Freq: Once | INTRAMUSCULAR | Status: DC

## 2023-06-15 MED ORDER — ACETAMINOPHEN 650 MG RE SUPP: 650.0000 mg | Freq: Four times a day (QID) | RECTAL | Status: DC | PRN

## 2023-06-15 MED ORDER — TRAZODONE HCL 50 MG PO TABS: 25.0000 mg | ORAL_TABLET | Freq: Every evening | ORAL | Status: DC | PRN

## 2023-06-15 MED ORDER — VANCOMYCIN HCL 750 MG/150ML IV SOLN
750.0000 mg | INTRAVENOUS | Status: DC
  Administered 2023-06-16: 750 mg via INTRAVENOUS
  Filled 2023-06-15: qty 150

## 2023-06-15 MED ORDER — CHLORHEXIDINE GLUCONATE CLOTH 2 % EX PADS
6.0000 | MEDICATED_PAD | Freq: Every day | CUTANEOUS | Status: DC
  Administered 2023-06-15: 6 via TOPICAL

## 2023-06-15 MED ORDER — ONDANSETRON HCL 4 MG PO TABS: 4.0000 mg | ORAL_TABLET | Freq: Four times a day (QID) | ORAL | Status: DC | PRN

## 2023-06-15 MED ORDER — METRONIDAZOLE 500 MG/100ML IV SOLN
500.0000 mg | Freq: Two times a day (BID) | INTRAVENOUS | Status: DC
  Administered 2023-06-15 – 2023-06-21 (×12): 500 mg via INTRAVENOUS
  Filled 2023-06-15 (×12): qty 100

## 2023-06-15 MED ORDER — LACTATED RINGERS IV SOLN: INTRAVENOUS | Status: DC

## 2023-06-15 MED ORDER — ORAL CARE MOUTH RINSE
15.0000 mL | OROMUCOSAL | Status: DC | PRN
  Administered 2023-06-16: 15 mL via OROMUCOSAL

## 2023-06-15 MED ORDER — LACTATED RINGERS IV BOLUS
1000.0000 mL | Freq: Once | INTRAVENOUS | Status: AC
  Administered 2023-06-15: 1000 mL via INTRAVENOUS

## 2023-06-15 MED ORDER — VANCOMYCIN HCL 1500 MG/300ML IV SOLN
1500.0000 mg | Freq: Once | INTRAVENOUS | Status: AC
  Administered 2023-06-15: 1500 mg via INTRAVENOUS
  Filled 2023-06-15: qty 300

## 2023-06-15 MED ORDER — SODIUM CHLORIDE 0.9 % IV SOLN
2.0000 g | Freq: Once | INTRAVENOUS | Status: AC
  Administered 2023-06-15: 2 g via INTRAVENOUS
  Filled 2023-06-15: qty 20

## 2023-06-15 MED ORDER — ACETAMINOPHEN 325 MG PO TABS
650.0000 mg | ORAL_TABLET | Freq: Once | ORAL | Status: AC
  Administered 2023-06-15: 650 mg via ORAL
  Filled 2023-06-15: qty 2

## 2023-06-15 MED ORDER — ONDANSETRON HCL 4 MG/2ML IJ SOLN
4.0000 mg | Freq: Four times a day (QID) | INTRAMUSCULAR | Status: DC | PRN
  Administered 2023-06-16 – 2023-06-18 (×2): 4 mg via INTRAVENOUS
  Filled 2023-06-15: qty 2

## 2023-06-15 MED ORDER — INSULIN ASPART 100 UNIT/ML IJ SOLN
0.0000 [IU] | Freq: Every day | INTRAMUSCULAR | Status: DC
  Filled 2023-06-15: qty 0.05

## 2023-06-15 NOTE — Sepsis Progress Note (Signed)
Confirmed with bedside staff that blood cultures were drawn before the antibiotic was given.

## 2023-06-15 NOTE — Progress Notes (Signed)
Pharmacy Antibiotic Note  Eric Zamora is a 77 y.o. male admitted on 06/15/2023 with sepsis.  Pharmacy has been consulted for vancomycin and cefepime dosing.  Weight not entered on admission, using height and weight from previous admission of 69 inches and weight of 75 mg, giving CrCl of 32 ml/min   Plan: Cefepime 2 gr IV q12h  Vancomycin 1500 mg IV x 1 then 750 mg IV q24h ( AUC 443, wt of 75 kg, Scr 1.99 mg/dl)  Monitor clinical course, renal function, cultures as available       Temp (24hrs), Avg:100.3 F (37.9 C), Min:100.1 F (37.8 C), Max:100.5 F (38.1 C)  Recent Labs  Lab 06/15/23 1040 06/15/23 1138 06/15/23 1305 06/15/23 1358  WBC  --   --  17.1*  --   CREATININE 1.99*  --   --   --   LATICACIDVEN  --  2.9*  --  1.9    CrCl cannot be calculated (Unknown ideal weight.).    No Known Allergies  Antimicrobials this admission: 10/15 ceftriaxone x 1  10/15 vancomycin >>  10/15 cefepime  >>   Dose adjustments this admission:   Microbiology results: 10/15 BCx:  10/15 MRSA PCR:    Adalberto Cole, PharmD, BCPS 06/15/2023 4:36 PM

## 2023-06-15 NOTE — H&P (Signed)
History and Physical  Eric Zamora:[id number] DOB: 04/16/1946 DOA: 06/15/2023  PCP: Uvaldo Bristle, PA-C   Chief Complaint: Confusion, fever.  HPI: Eric Zamora is a 77 y.o. male with medical history significant for bipolar disorder, dementia, history of NSTEMI, hypertrophic cardiomyopathy being admitted to the hospital for sepsis of unclear etiology.  Apparently since 8 AM this morning he has been more weak, had increased respiratory rate, and had some confusion.  Staff at the facility also noticed concentrated appearing and foul-smelling urine, this was corroborated upon arrival here.  Patient is awake and alert and oriented, however he is disoriented to the year.  He denies any discomfort, recent fevers, chills, nausea.  ED Course: The emergency department, has a fever of 100.5.  He is tachycardic and in atrial fibrillation.  Tachypneic up to 36, though this is now improved.  Blood pressure is stable but on the low side latest blood pressure 98/59.  Saturating 98% on room air.  Lab work significant for WBC 17, hemoglobin 12, nine 1.99, blood sugar 286.  BNP 1193.  Lactate 2.9, improved to 1.9 after 1 L LR.  He had CT scan of the head, as well as chest x-ray which did not show any acute process.  Urinalysis is unremarkable.  He was given a dose of IV Rocephin initially due to concern for UTI as the source of his sepsis.  Review of Systems: Please see HPI for pertinent positives and negatives. A complete 10 system review of systems are otherwise negative.  Past Medical History:  Diagnosis Date   Alzheimer disease (HCC)    Bipolar affective disorder (HCC)    Chronic renal insufficiency    Dementia (HCC)    Dyslipidemia    HTN (hypertension)    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   gradient on TEE   Syncopal episodes    Past Surgical History:  Procedure Laterality Date   LEFT HEART CATHETERIZATION WITH CORONARY ANGIOGRAM N/A 07/06/2013   Procedure: LEFT  HEART CATHETERIZATION WITH CORONARY ANGIOGRAM;  Surgeon: Marykay Lex, MD;  Location: [facility] CATH LAB;  Service: Cardiovascular;  Laterality: N/A;    Social History:  reports that he has never smoked. He has never used smokeless tobacco. He reports that he does not currently use drugs. He reports that he does not drink alcohol.   No Known Allergies  History reviewed. No pertinent family history.   Prior to Admission medications   Medication Sig Start Date End Date Taking? Authorizing Provider  acetaminophen (TYLENOL) 500 MG tablet Take 500 mg by mouth every 4 (four) hours as needed for moderate pain, headache or fever. Patient not taking: Reported on 09/25/2021    [provider]  aspirin 81 MG chewable tablet Chew 1 tablet (81 mg total) by mouth daily. 07/10/13   Rai, Delene Ruffini, MD  clopidogrel (PLAVIX) 75 MG tablet Take 75 mg by mouth daily. 05/01/21   [provider]  ergocalciferol (VITAMIN D2) 1.25 MG (50000 UT) capsule Take 50,000 Units by mouth every 30 (thirty) days. On the 7 th of every month.    [provider]  furosemide (LASIX) 40 MG tablet Take 1 tablet (40 mg total) by mouth daily. 07/10/13   Rai, Delene Ruffini, MD  levETIRAcetam (KEPPRA) 500 MG tablet Take 500 mg by mouth 2 (two) times daily. 05/01/21   [provider]  magnesium hydroxide (MILK OF MAGNESIA) 400 MG/5ML suspension Take 30 mLs by mouth at bedtime as needed for mild constipation. Patient  not taking: Reported on 09/25/2021    [provider]  metoprolol tartrate (LOPRESSOR) 25 MG tablet Take 1 tablet (25 mg total) by mouth 2 (two) times daily. 07/10/13   Rai, Ripudeep K, MD  PARoxetine (PAXIL) 10 MG tablet Take 10 mg by mouth daily. 05/01/21   [provider]  PARoxetine (PAXIL) 20 MG tablet Take 20 mg by mouth daily. Patient not taking: Reported on 09/25/2021    [provider]  potassium chloride SA (K-DUR,KLOR-CON) 20 MEQ tablet Take 2 tablets (40 mEq total)  by mouth daily. 07/10/13   Rai, Delene Ruffini, MD  simvastatin (ZOCOR) 20 MG tablet Take 20 mg by mouth at bedtime.    [provider]  Vitamin D, Ergocalciferol, (DRISDOL) 1.25 MG (50000 UNIT) CAPS capsule Take 50,000 Units by mouth every 7 (seven) days.    [provider]    Physical Exam: BP (!) 98/59   Pulse (!) 115   Temp (!) 100.5 F (38.1 C) (Oral)   Resp (!) 21   SpO2 97%   General:  Alert, oriented to self and place but not time, calm, in no acute distress, sleeping on my arrival but wakes up easily.  Moving all extremities, exam is nonfocal neurologically. Cardiovascular: Irregularly irregular, systolic murmur, he has some mild peripheral edema  Respiratory: clear to auscultation bilaterally, no wheezes, no crackles  Abdomen: soft, nontender, nondistended, normal bowel tones heard  Skin: dry, no rashes  Musculoskeletal: no joint effusions, normal range of motion  Psychiatric: appropriate affect, normal speech  Neurologic: extraocular muscles intact, clear speech, moving all extremities with intact sensorium         Labs on Admission:  Basic Metabolic Panel: Recent Labs  Lab 06/15/23 1040  NA 130*  K 4.9  CL 97*  CO2 20*  GLUCOSE 286*  BUN 38*  CREATININE 1.99*  CALCIUM 8.9   Liver Function Tests: Recent Labs  Lab 06/15/23 1040  AST 51*  ALT 36  ALKPHOS 51  BILITOT 1.0  PROT 9.1*  ALBUMIN 3.6   No results for input(s): "LIPASE", "AMYLASE" in the last 168 hours. No results for input(s): "AMMONIA" in the last 168 hours. CBC: Recent Labs  Lab 06/15/23 1305  WBC 17.1*  NEUTROABS 12.1*  HGB 11.8*  HCT 35.1*  MCV 87.1  PLT 198   Cardiac Enzymes: No results for input(s): "CKTOTAL", "CKMB", "CKMBINDEX", "TROPONINI" in the last 168 hours.  BNP (last 3 results) Recent Labs    06/15/23 1040  BNP 1,193.2*    ProBNP (last 3 results) No results for input(s): "PROBNP" in the last 8760 hours.  CBG: No results for input(s): "GLUCAP" in  the last 168 hours.  Radiological Exams on Admission: DG Chest Port 1 View  Result Date: 06/15/2023 CLINICAL DATA:  Questionable sepsis - evaluate for abnormality. Increased respiratory rate and generalized weakness. EXAM: PORTABLE CHEST 1 VIEW COMPARISON:  Chest radiograph 09/25/2021. FINDINGS: Low lung volumes accentuate the pulmonary vasculature and cardiomediastinal silhouette. No consolidation or pulmonary edema. No pleural effusion or pneumothorax. IMPRESSION: Low lung volumes without evidence of acute cardiopulmonary disease. Electronically Signed   By: Orvan Falconer M.D.   On: 06/15/2023 13:27   CT Head Wo Contrast  Result Date: 06/15/2023 CLINICAL DATA:  Mental status change, unknown cause. EXAM: CT HEAD WITHOUT CONTRAST TECHNIQUE: Contiguous axial images were obtained from the base of the skull through the vertex without intravenous contrast. RADIATION DOSE REDUCTION: This exam was performed according to the departmental dose-optimization program which  includes automated exposure control, adjustment of the mA and/or kV according to patient size and/or use of iterative reconstruction technique. COMPARISON:  Head CT 11/05/2021. FINDINGS: Brain: No acute hemorrhage. Gray-white differentiation is preserved. Unchanged central pattern of volume loss. No acute hydrocephalus or extra-axial collection. No mass effect or midline shift. Vascular: No hyperdense vessel or unexpected calcification. Skull: No calvarial fracture or suspicious bone lesion. Skull base is unremarkable. Sinuses/Orbits: No acute finding. Other: None. IMPRESSION: No acute intracranial abnormality. Electronically Signed   By: Orvan Falconer M.D.   On: 06/15/2023 13:25    Assessment/Plan SHYON SLAGTER is a 77 y.o. male with medical history significant for bipolar disorder, dementia, history of NSTEMI, hypertrophic cardiomyopathy being admitted to the hospital for sepsis of unclear etiology.  Severe sepsis-meeting criteria  with fever, tachycardia, tachypnea, hypotension, lactate 2.9 at the time of admission.  Patient is hemodynamically stable but with low blood pressures, lactate improving.  Source of sepsis is unclear at this time. -Inpatient admission to stepdown unit -Continuous cardiac telemetry -Initiate broad-spectrum sepsis labs to include IV vancomycin, IV cefepime, IV Flagyl -Follow-up blood cultures -Urinalysis looks uninfected -Receiving second liter of LR, with 125 cc/h of LR to follow  Atrial fibrillation-seen on monitor, however multiple EKGs today show sinus rhythm.  Reviewing his prior EKGs, I see no prior atrial fibrillation.  If in atrial fibrillation, this could be new and precipitated by sepsis.  In any case heart rate is relatively well-controlled, and in the setting of hypotension will avoid therapies that may drop his blood pressure further -Recheck EKG now -Will add on magnesium level -2D echo since there is also some concern about heart failure -Check TSH  Elevated BNP-in the setting of some new peripheral edema, there is concern about some heart failure.  He does have a history of hypertrophic cardiomyopathy -Check 2D echo as above -Monitor for signs or symptoms of worsening heart failure, in the setting of fluid resuscitation  Leukocytosis-due to sepsis, treating as above  Bipolar disorder with dementia-resume home medications once reconciled  DVT prophylaxis: Lovenox   Full code-patient noted to be DNR during prior hospitalization though that was several years ago.  Placed a phone call to his legal guardian but no answer, as I am unable to confirm his CODE STATUS, will continue full code for the time being.  Consults called: None  Admission status: The appropriate patient status for this patient is INPATIENT. Inpatient status is judged to be reasonable and necessary in order to provide the required intensity of service to ensure the patient's safety. The patient's presenting  symptoms, physical exam findings, and initial radiographic and laboratory data in the context of their chronic comorbidities is felt to place them at high risk for further clinical deterioration. Furthermore, it is not anticipated that the patient will be medically stable for discharge from the hospital within 2 midnights of admission.    I certify that at the point of admission it is my clinical judgment that the patient will require inpatient hospital care spanning beyond 2 midnights from the point of admission due to high intensity of service, high risk for further deterioration and high frequency of surveillance required   Time spent: 59 minutes  Shawntay Prest Sharlette Dense MD [facility] Pager [phone]  If 7PM-7AM, please contact night-coverage [email or web address] Password [email or web address]  06/15/2023, 3:00 PM

## 2023-06-15 NOTE — ED Triage Notes (Signed)
BIB [facility] from [facility] staff c/o pt more altered then usual, increased respiratory rate, generalized weakness. Needs more help with ADLs today [id number]

## 2023-06-15 NOTE — ED Provider Notes (Signed)
[place] EMERGENCY DEPARTMENT AT [facility] Provider Note   CSN: [id number] Arrival date & time: 06/15/23  1024     History  Chief Complaint  Patient presents with   Altered Mental Status    Eric Zamora is a 77 y.o. male past medical history significant for bipolar disorder, hyperlipidemia, previous NSTEMI, hypertrophic cardiomyopathy, hypertension, sepsis, pneumonia presents with concern for altered mental status, fever, increased respiratory rate, generalized weakness.  Increased need for help with ADLs.  They endorse some urinary incontinence and foul-smelling urine.  Patient is alert and oriented to self and place but not situation or time.  He does not have any reports of pain.  His blood glucose was elevated for EMS.  300, no previous history of diabetes.  They also endorse some lower extremity edema.   Altered Mental Status      Home Medications Prior to Admission medications   Medication Sig Start Date End Date Taking? Authorizing Provider  acetaminophen (TYLENOL) 500 MG tablet Take 500 mg by mouth every 4 (four) hours as needed for moderate pain, headache or fever. Patient not taking: Reported on 09/25/2021    [provider]  aspirin 81 MG chewable tablet Chew 1 tablet (81 mg total) by mouth daily. 07/10/13   Rai, Delene Ruffini, MD  clopidogrel (PLAVIX) 75 MG tablet Take 75 mg by mouth daily. 05/01/21   [provider]  ergocalciferol (VITAMIN D2) 1.25 MG (50000 UT) capsule Take 50,000 Units by mouth every 30 (thirty) days. On the 7 th of every month.    [provider]  furosemide (LASIX) 40 MG tablet Take 1 tablet (40 mg total) by mouth daily. 07/10/13   Rai, Delene Ruffini, MD  levETIRAcetam (KEPPRA) 500 MG tablet Take 500 mg by mouth 2 (two) times daily. 05/01/21   [provider]  magnesium hydroxide (MILK OF MAGNESIA) 400 MG/5ML suspension Take 30 mLs by mouth at bedtime as needed for mild constipation. Patient not taking:  Reported on 09/25/2021    [provider]  metoprolol tartrate (LOPRESSOR) 25 MG tablet Take 1 tablet (25 mg total) by mouth 2 (two) times daily. 07/10/13   Rai, Ripudeep K, MD  PARoxetine (PAXIL) 10 MG tablet Take 10 mg by mouth daily. 05/01/21   [provider]  PARoxetine (PAXIL) 20 MG tablet Take 20 mg by mouth daily. Patient not taking: Reported on 09/25/2021    [provider]  potassium chloride SA (K-DUR,KLOR-CON) 20 MEQ tablet Take 2 tablets (40 mEq total) by mouth daily. 07/10/13   Rai, Delene Ruffini, MD  simvastatin (ZOCOR) 20 MG tablet Take 20 mg by mouth at bedtime.    [provider]  Vitamin D, Ergocalciferol, (DRISDOL) 1.25 MG (50000 UNIT) CAPS capsule Take 50,000 Units by mouth every 7 (seven) days.    [provider]      Allergies    Patient has no known allergies.    Review of Systems   Review of Systems  Reason unable to perform ROS: altered mental status.    Physical Exam Updated Vital Signs BP (!) 98/59   Pulse (!) 115   Temp (!) 100.5 F (38.1 C) (Oral)   Resp (!) 21   SpO2 97%  Physical Exam Vitals and nursing note reviewed.  Constitutional:      General: He is not in acute distress.    Appearance: Normal appearance. He is ill-appearing.  HENT:     Head: Normocephalic and atraumatic.  Eyes:  General:        Right eye: No discharge.        Left eye: No discharge.  Cardiovascular:     Rate and Rhythm: Regular rhythm. Tachycardia present.     Heart sounds: No murmur heard.    No friction rub. No gallop.  Pulmonary:     Breath sounds: Normal breath sounds.     Comments: Shallow rapid respiration, tachypnea, no wheezing, rhonchi, stridor, rales, no focal consolidation Abdominal:     General: Bowel sounds are normal.     Palpations: Abdomen is soft.     Comments: Soft, nontender.  Skin:    General: Skin is warm and dry.     Capillary Refill: Capillary refill takes less than 2 seconds.  Neurological:      Mental Status: He is alert and oriented to person, place, and time.     Comments: Moves all 4 limbs spontaneously, equal strength in bilateral upper and lower extremity, can follow commands with some difficulty.  He is alert and oriented only to self and place but not time or situation.  Romberg, gait deferred due to septic condition on arrival, and suspicion that altered mental status is secondary to his infection.  Psychiatric:        Mood and Affect: Mood normal.        Behavior: Behavior normal.     ED Results / Procedures / Treatments   Labs (all labs ordered are listed, but only abnormal results are displayed) Labs Reviewed  COMPREHENSIVE METABOLIC PANEL - Abnormal; Notable for the following components:      Result Value   Sodium 130 (*)    Chloride 97 (*)    CO2 20 (*)    Glucose, Bld 286 (*)    BUN 38 (*)    Creatinine, Ser 1.99 (*)    Total Protein 9.1 (*)    AST 51 (*)    GFR, Estimated 34 (*)    All other components within normal limits  PROTIME-INR - Abnormal; Notable for the following components:   Prothrombin Time 15.8 (*)    All other components within normal limits  APTT - Abnormal; Notable for the following components:   aPTT 22 (*)    All other components within normal limits  URINALYSIS, W/ REFLEX TO CULTURE (INFECTION SUSPECTED) - Abnormal; Notable for the following components:   Glucose, UA >=500 (*)    Hgb urine dipstick MODERATE (*)    Protein, ur 100 (*)    All other components within normal limits  BRAIN NATRIURETIC PEPTIDE - Abnormal; Notable for the following components:   B Natriuretic Peptide 1,193.2 (*)    All other components within normal limits  BETA-HYDROXYBUTYRIC ACID - Abnormal; Notable for the following components:   Beta-Hydroxybutyric Acid 0.58 (*)    All other components within normal limits  BLOOD GAS, VENOUS - Abnormal; Notable for the following components:   pCO2, Ven 38 (*)    pO2, Ven 31 (*)    Acid-base deficit 2.9 (*)    All  other components within normal limits  CBC WITH DIFFERENTIAL/PLATELET - Abnormal; Notable for the following components:   WBC 17.1 (*)    RBC 4.03 (*)    Hemoglobin 11.8 (*)    HCT 35.1 (*)    Neutro Abs 12.1 (*)    Monocytes Absolute 3.2 (*)    Abs Immature Granulocytes 0.16 (*)    All other components within normal limits  I-STAT CG4 LACTIC ACID, ED -  Abnormal; Notable for the following components:   Lactic Acid, Venous 2.9 (*)    All other components within normal limits  CULTURE, BLOOD (ROUTINE X 2)  CULTURE, BLOOD (ROUTINE X 2)  CBC WITH DIFFERENTIAL/PLATELET  I-STAT CG4 LACTIC ACID, ED    EKG EKG Interpretation Date/Time:  Tuesday June 15 2023 12:10:18 EDT Ventricular Rate:  104 PR Interval:  152 QRS Duration:  99 QT Interval:  322 QTC Calculation: 424 R Axis:   68  Text Interpretation: Sinus or ectopic atrial tachycardia Abnrm T, consider ischemia, anterolateral lds Since last tracing of earlier today No significant change was found Confirmed by Elayne Snare ([id number]) on 06/15/2023 12:33:44 PM  Radiology DG Chest Port 1 View  Result Date: 06/15/2023 CLINICAL DATA:  Questionable sepsis - evaluate for abnormality. Increased respiratory rate and generalized weakness. EXAM: PORTABLE CHEST 1 VIEW COMPARISON:  Chest radiograph 09/25/2021. FINDINGS: Low lung volumes accentuate the pulmonary vasculature and cardiomediastinal silhouette. No consolidation or pulmonary edema. No pleural effusion or pneumothorax. IMPRESSION: Low lung volumes without evidence of acute cardiopulmonary disease. Electronically Signed   By: Orvan Falconer M.D.   On: 06/15/2023 13:27   CT Head Wo Contrast  Result Date: 06/15/2023 CLINICAL DATA:  Mental status change, unknown cause. EXAM: CT HEAD WITHOUT CONTRAST TECHNIQUE: Contiguous axial images were obtained from the base of the skull through the vertex without intravenous contrast. RADIATION DOSE REDUCTION: This exam was performed according to  the departmental dose-optimization program which includes automated exposure control, adjustment of the mA and/or kV according to patient size and/or use of iterative reconstruction technique. COMPARISON:  Head CT 11/05/2021. FINDINGS: Brain: No acute hemorrhage. Gray-white differentiation is preserved. Unchanged central pattern of volume loss. No acute hydrocephalus or extra-axial collection. No mass effect or midline shift. Vascular: No hyperdense vessel or unexpected calcification. Skull: No calvarial fracture or suspicious bone lesion. Skull base is unremarkable. Sinuses/Orbits: No acute finding. Other: None. IMPRESSION: No acute intracranial abnormality. Electronically Signed   By: Orvan Falconer M.D.   On: 06/15/2023 13:25    Procedures .Critical Care  Performed by: Olene Floss, PA-C Authorized by: Olene Floss, PA-C   Critical care provider statement:    Critical care time (minutes):  35   Critical care was necessary to treat or prevent imminent or life-threatening deterioration of the following conditions:  Sepsis   Critical care was time spent personally by me on the following activities:  Development of treatment plan with patient or surrogate, discussions with consultants, evaluation of patient's response to treatment, examination of patient, ordering and review of laboratory studies, ordering and review of radiographic studies, ordering and performing treatments and interventions, pulse oximetry, re-evaluation of patient's condition and review of old charts   Care discussed with: admitting provider       Medications Ordered in ED Medications  lactated ringers bolus 1,000 mL (has no administration in time range)  lactated ringers bolus 1,000 mL (0 mLs Intravenous Stopped 06/15/23 1310)  cefTRIAXone (ROCEPHIN) 2 g in sodium chloride 0.9 % 100 mL IVPB (0 g Intravenous Stopped 06/15/23 1155)  acetaminophen (TYLENOL) tablet 650 mg (650 mg Oral Given 06/15/23 1430)     ED Course/ Medical Decision Making/ A&P                                 Medical Decision Making Amount and/or Complexity of Data Reviewed Labs: ordered. Radiology: ordered. ECG/medicine tests: ordered.  This patient is a 77 y.o. male who presents to the ED for concern of altered mental status, undifferentiated sepsis, suspected urinary source, this involves an extensive number of treatment options, and is a complaint that carries with it a high risk of complications and morbidity. The emergent differential diagnosis prior to evaluation includes, but is not limited to,  CVA, seizure, hypotension, sepsis, hypoglycemia, hypoxic encephalopathy, metabolic encephalopathy, polypharmacy, substance abuse, developing dementia or alzheimers, meningitis, encephalitis, hypertensive emergency, other systemic infection, acute alcohol intoxication, acute alcohol or other drug withdrawal or psychiatric manifestation vs other . This is not an exhaustive differential.   Past Medical History / Co-morbidities / Social History: Bipolar disorder, hyperlipidemia, hypertension, hypertrophic obstructive cardiomyopathy, pneumonia, dementia, Alzheimer's  Additional history: Chart reviewed. Pertinent results include: Reviewed lab work, imaging from previous emergency department evaluations  Physical Exam: Physical exam performed. The pertinent findings include: Patient is alert and oriented only to self and place but not time and situation.  Obvious sepsis on arrival, temperature 100.1, elevated to 100.5 on recheck, he was initially quite tachypneic with rapid shallow respirations, his respiratory rate has significantly improved with only mild tachypnea on reassessment around 20 to 21 breaths/min.  Pressures have been normotensive other than 1 mildly soft blood pressure on recent recheck around 98/59, he is receiving some fluid rehydration.  Clinically does seem to need fluids for sepsis despite some signs of fluid  overload, he does have some pitting edema on lower extremities, however he has no crackles, rales in the lungs.  No sacral ulcer or other skin abnormality noted.  No focal neurologic deficits, moving all 4 limbs spontaneously on our exam.  Foul-smelling urine on arrival, Shallow rapid respiration, tachypnea, no wheezing, rhonchi, stridor, rales, no focal consolidation  Lab Tests: I ordered, and personally interpreted labs.  The pertinent results include: Significant leukocytosis, white blood cell 17.1, mild anemia, hemoglobin 11.8.  Normal platelets.  His CMP is notable for moderate hyponatremia, sodium 130, he does have an elevated creatinine, BUN, 1.99, BUN 38, from baseline around 1.5-7.  BNP is elevated at around 1100, he does have some fluid overload with pitting edema on the lower extremities, but with no signs of pleural effusion, or interstitial edema, no signs crackles, rales on lung auscultation.  VBG without acidosis, his initial lactic acid is elevated 2.9 but with repeat improved at 1.9.  Blood cultures pending.  UA pending at time of admission.  Additionally with some new hyperglycemia without history of diabetes, likely secondary to his sepsis.   Imaging Studies: I ordered imaging studies including plain film chest x-ray, CT head without contrast. I independently visualized and interpreted imaging which showed no acute intrathoracic or intracranial abnormality at this time.. I agree with the radiologist interpretation.   Cardiac Monitoring:  The patient was maintained on a cardiac monitor.  My attending physician Dr. Theresia Lo viewed and interpreted the cardiac monitored which showed an underlying rhythm of: sinus tachycardia. I agree with this interpretation.   Medications: I ordered medication including fluids for sepsis, Rocephin for suspected urinary source, Tylenol for fever.  Tachypnea has improved but patient remains tachycardic, febrile  Consultations Obtained: I requested  consultation with the hospitalist, spoke with Dr. Kirby Crigler,  and discussed lab and imaging findings as well as pertinent plan - they recommend: admission for sepsis, suspected urosepsis   Disposition: After consideration of the diagnostic results and the patients response to treatment, I feel that patient benefit from admission for suspected urosepsis.   I discussed this  case with my attending physician Dr. Theresia Lo who cosigned this note including patient's presenting symptoms, physical exam, and planned diagnostics and interventions. Attending physician stated agreement with plan or made changes to plan which were implemented.    Final Clinical Impression(s) / ED Diagnoses Final diagnoses:  Altered mental status, unspecified altered mental status type  Sepsis, due to unspecified organism, unspecified whether acute organ dysfunction present [facility])    Rx / DC Orders ED Discharge Orders     None         Olene Floss, PA-C 06/15/23 1451    Rexford Maus, DO 06/15/23 1518

## 2023-06-15 NOTE — Sepsis Progress Note (Signed)
eLink is following this Code Sepsis. °

## 2023-06-15 NOTE — ED Notes (Signed)
ED TO INPATIENT HANDOFF REPORT  ED Nurse Name and Phone #:  Mellody Dance  -  [phone]  S Name/Age/Gender Eric Zamora 77 y.o. male Room/Bed: WA06/WA06  Code Status   Code Status: Limited: Do not attempt resuscitation (DNR) -DNR-LIMITED -Do Not Intubate/DNI   Home/SNF/Other Skilled nursing facility Patient oriented to: self, place, and situation Is this baseline? Yes   Triage Complete: Triage complete  Chief Complaint Severe sepsis (HCC) [A41.9, R65.20]  Triage Note BIB [facility] from [facility] staff c/o pt more altered then usual, increased respiratory rate, generalized weakness. Needs more help with ADLs today [id number]   Allergies No Known Allergies  Level of Care/Admitting Diagnosis ED Disposition     ED Disposition  Admit   Condition  --   Comment  Hospital Area: [facility] [[id number]]  Level of Care: Stepdown [14]  Admit to SDU based on following criteria: Severe physiological/psychological symptoms:  Any diagnosis requiring assessment & intervention at least every 4 hours on an ongoing basis to obtain desired patient outcomes including stability and rehabilitation  May admit patient to [facility] or [facility] if equivalent level of care is available:: Yes  Covid Evaluation: Asymptomatic - no recent exposure (last 10 days) testing not required  Diagnosis: Severe sepsis [facility]) [[id number]]  Admitting Physician: Maryln Gottron [[id number]]  Attending Physician: [facility], MIR [email or web address] [[id number]]  Certification:: I certify this patient will need inpatient services for at least 2 midnights  Expected Medical Readiness: 06/18/2023          B Medical/Surgery History Past Medical History:  Diagnosis Date   Alzheimer disease (HCC)    Bipolar affective disorder (HCC)    Chronic renal insufficiency    Dementia (HCC)    Dyslipidemia    HTN (hypertension)    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   gradient on TEE   Syncopal episodes    Past  Surgical History:  Procedure Laterality Date   LEFT HEART CATHETERIZATION WITH CORONARY ANGIOGRAM N/A 07/06/2013   Procedure: LEFT HEART CATHETERIZATION WITH CORONARY ANGIOGRAM;  Surgeon: Marykay Lex, MD;  Location: [facility] CATH LAB;  Service: Cardiovascular;  Laterality: N/A;     A IV Location/Drains/Wounds Patient Lines/Drains/Airways Status     Active Line/Drains/Airways     Name Placement date Placement time Site Days   Peripheral IV 06/15/23 20 G Left;Posterior;Proximal Forearm 06/15/23  1120  Forearm  less than 1            Intake/Output Last 24 hours  Intake/Output Summary (Last 24 hours) at 06/15/2023 1510 Last data filed at 06/15/2023 1310 Gross per 24 hour  Intake 1100 ml  Output --  Net 1100 ml    Labs/Imaging Results for orders placed or performed during the hospital encounter of 06/15/23 (from the past 48 hour(s))  Comprehensive metabolic panel     Status: Abnormal   Collection Time: 06/15/23 10:40 AM  Result Value Ref Range   Sodium 130 (L) 135 - 145 mmol/L   Potassium 4.9 3.5 - 5.1 mmol/L   Chloride 97 (L) 98 - 111 mmol/L   CO2 20 (L) 22 - 32 mmol/L   Glucose, Bld 286 (H) 70 - 99 mg/dL    Comment: Glucose reference range applies only to samples taken after fasting for at least 8 hours.   BUN 38 (H) 8 - 23 mg/dL   Creatinine, Ser 8.65 (H) 0.61 - 1.24 mg/dL   Calcium 8.9 8.9 - 78.4 mg/dL   Total Protein 9.1 (  H) 6.5 - 8.1 g/dL   Albumin 3.6 3.5 - 5.0 g/dL   AST 51 (H) 15 - 41 U/L   ALT 36 0 - 44 U/L   Alkaline Phosphatase 51 38 - 126 U/L   Total Bilirubin 1.0 0.3 - 1.2 mg/dL   GFR, Estimated 34 (L) >60 mL/min    Comment: (NOTE) Calculated using the CKD-EPI Creatinine Equation (2021)    Anion gap 13 5 - 15    Comment: Performed at [facility], [address] [place], [place] [address]  Protime-INR     Status: Abnormal   Collection Time: 06/15/23 10:40 AM  Result Value Ref Range   Prothrombin Time 15.8 (H) 11.4 - 15.2 seconds    INR 1.2 0.8 - 1.2    Comment: (NOTE) INR goal varies based on device and disease states. Performed at [facility], [address], [place], [place] [address]   APTT     Status: Abnormal   Collection Time: 06/15/23 10:40 AM  Result Value Ref Range   aPTT 22 (L) 24 - 36 seconds    Comment: Performed at [facility], [address] [place], [place] [address]  Urinalysis, w/ Reflex to Culture (Infection Suspected) -Urine, Clean Catch     Status: Abnormal   Collection Time: 06/15/23 10:40 AM  Result Value Ref Range   Specimen Source URINE, CLEAN CATCH    Color, Urine YELLOW YELLOW   APPearance CLEAR CLEAR   Specific Gravity, Urine 1.018 1.005 - 1.030   pH 5.0 5.0 - 8.0   Glucose, UA >=500 (A) NEGATIVE mg/dL   Hgb urine dipstick MODERATE (A) NEGATIVE   Bilirubin Urine NEGATIVE NEGATIVE   Ketones, ur NEGATIVE NEGATIVE mg/dL   Protein, ur 914 (A) NEGATIVE mg/dL   Nitrite NEGATIVE NEGATIVE   Leukocytes,Ua NEGATIVE NEGATIVE   RBC / HPF 0-5 0 - 5 RBC/hpf   WBC, UA 0-5 0 - 5 WBC/hpf    Comment:        Reflex urine culture not performed if WBC <=10, OR if Squamous epithelial cells >5. If Squamous epithelial cells >5 suggest recollection.    Bacteria, UA NONE SEEN NONE SEEN   Squamous Epithelial / HPF 0-5 0 - 5 /HPF   Hyaline Casts, UA PRESENT    Granular Casts, UA PRESENT     Comment: Performed at [facility], [address] [place], [place] [address]  Brain natriuretic peptide     Status: Abnormal   Collection Time: 06/15/23 10:40 AM  Result Value Ref Range   B Natriuretic Peptide 1,193.2 (H) 0.0 - 100.0 pg/mL    Comment: Performed at [facility], [address], [place], [place] [address]  Beta-hydroxybutyric acid     Status: Abnormal   Collection Time: 06/15/23 10:41 AM  Result Value Ref Range   Beta-Hydroxybutyric Acid 0.58 (H) 0.05 - 0.27 mmol/L    Comment: Performed at [facility], [address], [place], [place] [address]  I-Stat Lactic Acid, ED     Status: Abnormal   Collection Time: 06/15/23 11:38 AM  Result Value Ref Range   Lactic Acid, Venous 2.9 (HH) 0.5 - 1.9 mmol/L   Comment NOTIFIED PHYSICIAN   Blood gas, venous     Status: Abnormal   Collection Time: 06/15/23  1:05 PM  Result Value Ref Range   pH, Ven 7.37 7.25 - 7.43   pCO2, Ven 38 (L) 44 - 60 mmHg  pO2, Ven 31 (LL) 32 - 45 mmHg    Comment: CRITICAL RESULT CALLED TO, READ BACK BY AND VERIFIED WITH: KISER,C. RN AT 1318 06/15/23 MULLINS,T    Bicarbonate 22.0 20.0 - 28.0 mmol/L   Acid-base deficit 2.9 (H) 0.0 - 2.0 mmol/L   O2 Saturation 48.8 %   Patient temperature 37.0     Comment: Performed at [facility], [address] [place], [place] [address]  CBC with Differential/Platelet     Status: Abnormal   Collection Time: 06/15/23  1:05 PM  Result Value Ref Range   WBC 17.1 (H) 4.0 - 10.5 K/uL   RBC 4.03 (L) 4.22 - 5.81 MIL/uL   Hemoglobin 11.8 (L) 13.0 - 17.0 g/dL   HCT 60.4 (L) 54.0 - 98.1 %   MCV 87.1 80.0 - 100.0 fL   MCH 29.3 26.0 - 34.0 pg   MCHC 33.6 30.0 - 36.0 g/dL   RDW 19.1 47.8 - 29.5 %   Platelets 198 150 - 400 K/uL   nRBC 0.0 0.0 - 0.2 %   Neutrophils Relative % 70 %   Neutro Abs 12.1 (H) 1.7 - 7.7 K/uL   Lymphocytes Relative 9 %   Lymphs Abs 1.5 0.7 - 4.0 K/uL   Monocytes Relative 19 %   Monocytes Absolute 3.2 (H) 0.1 - 1.0 K/uL   Eosinophils Relative 0 %   Eosinophils Absolute 0.0 0.0 - 0.5 K/uL   Basophils Relative 1 %   Basophils Absolute 0.1 0.0 - 0.1 K/uL   Immature Granulocytes 1 %   Abs Immature Granulocytes 0.16 (H) 0.00 - 0.07 K/uL    Comment: Performed at [facility], [address] [place], [place] [address]  I-Stat Lactic Acid, ED     Status: None   Collection Time: 06/15/23  1:58 PM  Result Value Ref Range   Lactic Acid, Venous 1.9 0.5 - 1.9 mmol/L   DG Chest Port 1 View  Result Date:  06/15/2023 CLINICAL DATA:  Questionable sepsis - evaluate for abnormality. Increased respiratory rate and generalized weakness. EXAM: PORTABLE CHEST 1 VIEW COMPARISON:  Chest radiograph 09/25/2021. FINDINGS: Low lung volumes accentuate the pulmonary vasculature and cardiomediastinal silhouette. No consolidation or pulmonary edema. No pleural effusion or pneumothorax. IMPRESSION: Low lung volumes without evidence of acute cardiopulmonary disease. Electronically Signed   By: Orvan Falconer M.D.   On: 06/15/2023 13:27   CT Head Wo Contrast  Result Date: 06/15/2023 CLINICAL DATA:  Mental status change, unknown cause. EXAM: CT HEAD WITHOUT CONTRAST TECHNIQUE: Contiguous axial images were obtained from the base of the skull through the vertex without intravenous contrast. RADIATION DOSE REDUCTION: This exam was performed according to the departmental dose-optimization program which includes automated exposure control, adjustment of the mA and/or kV according to patient size and/or use of iterative reconstruction technique. COMPARISON:  Head CT 11/05/2021. FINDINGS: Brain: No acute hemorrhage. Gray-white differentiation is preserved. Unchanged central pattern of volume loss. No acute hydrocephalus or extra-axial collection. No mass effect or midline shift. Vascular: No hyperdense vessel or unexpected calcification. Skull: No calvarial fracture or suspicious bone lesion. Skull base is unremarkable. Sinuses/Orbits: No acute finding. Other: None. IMPRESSION: No acute intracranial abnormality. Electronically Signed   By: Orvan Falconer M.D.   On: 06/15/2023 13:25    Pending Labs Unresulted Labs (From admission, onward)     Start     Ordered   06/16/23 0500  Basic metabolic panel  Tomorrow morning,   R  06/15/23 1500   06/16/23 0500  CBC  Tomorrow morning,   R        06/15/23 1500   06/15/23 1459  Hemoglobin A1c  (Glycemic Control (SSI)  Q 4 Hours / Glycemic Control (SSI)  AC +/- HS)  Once,   R        Comments: To assess prior glycemic control    06/15/23 1500   06/15/23 1040  CBC with Differential  (Undifferentiated presentation (screening labs and basic nursing orders))  ONCE - STAT,   STAT        06/15/23 1040   06/15/23 1040  Blood Culture (routine x 2)  (Undifferentiated presentation (screening labs and basic nursing orders))  BLOOD CULTURE X 2,   STAT      06/15/23 1040            Vitals/Pain Today's Vitals   06/15/23 1200 06/15/23 1315 06/15/23 1430 06/15/23 1431  BP: 114/66 109/65 (!) 98/59   Pulse: (!) 105 (!) 112 (!) 114 (!) 115  Resp: (!) 31 (!) 28 (!) 22 (!) 21  Temp:  (!) 100.5 F (38.1 C)    TempSrc:  Oral    SpO2: 100% 98% 98% 97%    Isolation Precautions No active isolations  Medications Medications  lactated ringers bolus 1,000 mL (has no administration in time range)  metroNIDAZOLE (FLAGYL) IVPB 500 mg (has no administration in time range)  enoxaparin (LOVENOX) injection 40 mg (has no administration in time range)  insulin aspart (novoLOG) injection 0-15 Units (has no administration in time range)  insulin aspart (novoLOG) injection 0-5 Units (has no administration in time range)  lactated ringers infusion (has no administration in time range)  acetaminophen (TYLENOL) tablet 650 mg (has no administration in time range)    Or  acetaminophen (TYLENOL) suppository 650 mg (has no administration in time range)  traZODone (DESYREL) tablet 25 mg (has no administration in time range)  ondansetron (ZOFRAN) tablet 4 mg (has no administration in time range)    Or  ondansetron (ZOFRAN) injection 4 mg (has no administration in time range)  albuterol (PROVENTIL) (2.5 MG/3ML) 0.083% nebulizer solution 2.5 mg (has no administration in time range)  lactated ringers bolus 1,000 mL (0 mLs Intravenous Stopped 06/15/23 1310)  cefTRIAXone (ROCEPHIN) 2 g in sodium chloride 0.9 % 100 mL IVPB (0 g Intravenous Stopped 06/15/23 1155)  acetaminophen (TYLENOL) tablet 650 mg  (650 mg Oral Given 06/15/23 1430)    Mobility walks     Focused Assessments    R Recommendations: See Admitting Provider Note  Report given to:   Additional Notes:

## 2023-06-16 ENCOUNTER — Inpatient Hospital Stay (HOSPITAL_COMMUNITY): Payer: Medicare (Managed Care)

## 2023-06-16 DIAGNOSIS — I2583 Coronary atherosclerosis due to lipid rich plaque: Secondary | ICD-10-CM

## 2023-06-16 DIAGNOSIS — N179 Acute kidney failure, unspecified: Secondary | ICD-10-CM | POA: Diagnosis not present

## 2023-06-16 DIAGNOSIS — I4891 Unspecified atrial fibrillation: Secondary | ICD-10-CM | POA: Diagnosis not present

## 2023-06-16 DIAGNOSIS — I2510 Atherosclerotic heart disease of native coronary artery without angina pectoris: Secondary | ICD-10-CM | POA: Diagnosis not present

## 2023-06-16 DIAGNOSIS — Z79899 Other long term (current) drug therapy: Secondary | ICD-10-CM

## 2023-06-16 DIAGNOSIS — A419 Sepsis, unspecified organism: Secondary | ICD-10-CM | POA: Diagnosis not present

## 2023-06-16 DIAGNOSIS — E1165 Type 2 diabetes mellitus with hyperglycemia: Secondary | ICD-10-CM

## 2023-06-16 DIAGNOSIS — N1832 Chronic kidney disease, stage 3b: Secondary | ICD-10-CM

## 2023-06-16 DIAGNOSIS — F319 Bipolar disorder, unspecified: Secondary | ICD-10-CM

## 2023-06-16 DIAGNOSIS — I5032 Chronic diastolic (congestive) heart failure: Secondary | ICD-10-CM

## 2023-06-16 DIAGNOSIS — E782 Mixed hyperlipidemia: Secondary | ICD-10-CM | POA: Diagnosis present

## 2023-06-16 DIAGNOSIS — E871 Hypo-osmolality and hyponatremia: Secondary | ICD-10-CM

## 2023-06-16 LAB — GLUCOSE, CAPILLARY
Glucose-Capillary: 228 mg/dL — ABNORMAL HIGH (ref 70–99)
Glucose-Capillary: 294 mg/dL — ABNORMAL HIGH (ref 70–99)
Glucose-Capillary: 118 mg/dL — ABNORMAL HIGH (ref 70–99)
Glucose-Capillary: 179 mg/dL — ABNORMAL HIGH (ref 70–99)

## 2023-06-16 LAB — URINALYSIS, W/ REFLEX TO CULTURE (INFECTION SUSPECTED)
Bacteria, UA: NONE SEEN
Bilirubin Urine: NEGATIVE
Glucose, UA: >=500 mg/dL — AB
Ketones, ur: 5 mg/dL — AB
Leukocytes,Ua: NEGATIVE
Nitrite: NEGATIVE
Protein, ur: 30 mg/dL — AB
Specific Gravity, Urine: 1.024 (ref 1.005–1.030)
pH: 5 (ref 5.0–8.0)

## 2023-06-16 LAB — RESPIRATORY PANEL BY PCR

## 2023-06-16 LAB — CBC
HCT: 38.3 % — ABNORMAL LOW (ref 39.0–52.0)
Hemoglobin: 12.7 g/dL — ABNORMAL LOW (ref 13.0–17.0)
MCH: 29 pg (ref 26.0–34.0)
MCHC: 33.2 g/dL (ref 30.0–36.0)
MCV: 87.4 fL (ref 80.0–100.0)
Platelets: 188 10*3/uL (ref 150–400)
RBC: 4.38 MIL/uL (ref 4.22–5.81)
RDW: 13 % (ref 11.5–15.5)
WBC: 14.5 10*3/uL — ABNORMAL HIGH (ref 4.0–10.5)
nRBC: 0 % (ref 0.0–0.2)

## 2023-06-16 LAB — BASIC METABOLIC PANEL
Anion gap: 13 (ref 5–15)
BUN: 32 mg/dL — ABNORMAL HIGH (ref 8–23)
CO2: 18 mmol/L — ABNORMAL LOW (ref 22–32)
Calcium: 8.4 mg/dL — ABNORMAL LOW (ref 8.9–10.3)
Chloride: 103 mmol/L (ref 98–111)
Creatinine, Ser: 1.66 mg/dL — ABNORMAL HIGH (ref 0.61–1.24)
GFR, Estimated: 42 mL/min — ABNORMAL LOW (ref >60)
Glucose, Bld: 298 mg/dL — ABNORMAL HIGH (ref 70–99)
Potassium: 4.7 mmol/L (ref 3.5–5.1)
Sodium: 134 mmol/L — ABNORMAL LOW (ref 135–145)

## 2023-06-16 LAB — SARS CORONAVIRUS 2 BY RT PCR: SARS Coronavirus 2 by RT PCR: NEGATIVE

## 2023-06-16 LAB — ECHOCARDIOGRAM COMPLETE
Area-P 1/2: 4.65 cm2
Height: 65 in
MV M vel: 4.37 m/s
MV Peak grad: 76.2 mm[Hg]
S' Lateral: 2.7 cm
Single Plane A4C EF: 64.7 %
Weight: 2825.42 [oz_av]

## 2023-06-16 MED ORDER — METOPROLOL TARTRATE 25 MG PO TABS
25.0000 mg | ORAL_TABLET | Freq: Two times a day (BID) | ORAL | Status: DC
  Administered 2023-06-16 (×2): 25 mg via ORAL
  Filled 2023-06-16 (×2): qty 1

## 2023-06-16 MED ORDER — APIXABAN 5 MG PO TABS
5.0000 mg | ORAL_TABLET | Freq: Two times a day (BID) | ORAL | Status: DC
  Administered 2023-06-16 (×2): 5 mg via ORAL
  Filled 2023-06-16 (×2): qty 1

## 2023-06-16 MED ORDER — SIMVASTATIN 10 MG PO TABS
20.0000 mg | ORAL_TABLET | Freq: Every day | ORAL | Status: DC
  Administered 2023-06-16: 20 mg via ORAL
  Filled 2023-06-16: qty 1

## 2023-06-16 MED ORDER — LEVETIRACETAM 500 MG PO TABS
500.0000 mg | ORAL_TABLET | Freq: Two times a day (BID) | ORAL | Status: DC
  Administered 2023-06-16 (×2): 500 mg via ORAL
  Filled 2023-06-16 (×2): qty 1

## 2023-06-16 MED ORDER — PAROXETINE HCL 10 MG PO TABS
10.0000 mg | ORAL_TABLET | Freq: Every day | ORAL | Status: DC
  Administered 2023-06-16: 10 mg via ORAL
  Filled 2023-06-16 (×2): qty 1

## 2023-06-16 MED ORDER — CLOPIDOGREL BISULFATE 75 MG PO TABS: 75.0000 mg | ORAL_TABLET | Freq: Every day | ORAL | Status: DC

## 2023-06-16 MED ORDER — SODIUM CHLORIDE 0.9% FLUSH
3.0000 mL | Freq: Two times a day (BID) | INTRAVENOUS | Status: DC
  Administered 2023-06-16 – 2023-08-02 (×86): 3 mL via INTRAVENOUS

## 2023-06-16 NOTE — Inpatient Diabetes Management (Signed)
Inpatient Diabetes Program Recommendations  AACE/ADA: New Consensus Statement on Inpatient Glycemic Control (2015)  Target Ranges:  Prepandial:   less than 140 mg/dL      Peak postprandial:   less than 180 mg/dL (1-2 hours)      Critically ill patients:  140 - 180 mg/dL   Lab Results  Component Value Date   GLUCAP 228 (H) 06/16/2023   HGBA1C 9.4 (H) 06/15/2023    Review of Glycemic Control  Diabetes history: None - New onset DM Outpatient Diabetes medications: None Current orders for Inpatient glycemic control: Novolog 0-15 TID and 0-5 HS  HgbA1C - 9.4% CBGs this am: 298, 228  Inpatient Diabetes Program Recommendations:    Consider Semglee 8 units at bedtime  Will continue to follow.   Thank you. Ailene Ards, RD, LDN, CDCES Inpatient Diabetes Coordinator [phone]

## 2023-06-16 NOTE — Plan of Care (Signed)
  Problem: Metabolic: Goal: Ability to maintain appropriate glucose levels will improve Outcome: Progressing   Problem: Nutritional: Goal: Maintenance of adequate nutrition will improve Outcome: Progressing   Problem: Clinical Measurements: Goal: Cardiovascular complication will be avoided Outcome: Progressing   Problem: Nutrition: Goal: Adequate nutrition will be maintained Outcome: Progressing   Problem: Coping: Goal: Level of anxiety will decrease Outcome: Progressing   Problem: Elimination: Goal: Will not experience complications related to bowel motility Outcome: Progressing Goal: Will not experience complications related to urinary retention Outcome: Progressing   Problem: Pain Managment: Goal: General experience of comfort will improve Outcome: Progressing   Problem: Safety: Goal: Ability to remain free from injury will improve Outcome: Progressing

## 2023-06-16 NOTE — Assessment & Plan Note (Addendum)
Cr 1.99 on admission from baseline ~1.3.   Creatinine back down to 1.6 - Trend Cr - Avoid nephrotoxins

## 2023-06-16 NOTE — Hospital Course (Addendum)
77 y.o. 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   10/15 admitted to stepdown unit. 10/17: Overnight with fever, aspiration event, new AG acidosis, transferred back to SDU for BiPAP and insulin drip 10/18: taken to OR for drainage of abscess 10/23 cardiology was consulted for new onset A-fib with RVR. 10/24 nephrology was consulted for AKI and hyponatremia.  CT head was obtained for ongoing AMS and was found to have acute/subacute right MCA territory infarct.  Neurology was also consulted. 10/25.  Palliative care was consulted. 10/31.  Anticoagulation started again. 11/1.  Second dose of Bicillin given.  Underwent MBS. 11/2.  Marinol started for poor p.o. intake. 11/4.  Found to have ileus.  GI consulted.  Recommended DNR to legal guardian. 11/5.  Two-physician DNR form was signed and sent to guardian.  Will await decision. Subsequently developed colitis.  Significant diarrhea noted.  GI was consulted.  C. difficile and GI pathogen panel testing were negative.

## 2023-06-16 NOTE — Progress Notes (Signed)
Progress Note   Patient: Eric Zamora MVH:[id number] DOB: 05-15-1946 DOA: 06/15/2023     1 DOS: the patient was seen and examined on 06/16/2023 at 7:50AM      Brief hospital course: Mr. Scurti is a 77 y.o. 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  In the ER, started on antibiotics.  CXR and UA unremarkable.        Assessment and Plan: * Severe sepsis (HCC) P/w fever, leukocytosis, renal failure, reported encephalopathy and hypotension >40 mmHg drop from baseline.  Suspect infection, unclear source at present.  CXR clear on admission, UA with RBCs, no nitrites, WBC, bacteria.  Abdomen benign. No localizing symptoms on my exam - Continue antibiotics - Follow culture data - Repeat UA - Serial abdominal exams, trend LFTs - Obtain COVID and RVP    Atrial fibrillation with rapid ventricular response (HCC) New onset.  CHA2DS2-Vasc 6 (age, HTN, CHF, DM, vascular disease).  TSH normal. - Obtain echo - Start Eliquis - Resume home metoprolol - Stop Plavix    Acute renal failure superimposed on stage 3b chronic kidney disease (HCC) Cr 1.99 on admission from baseline ~1.3.  Improved overnight with fluids. - Trnd Cr - Avoid nephrotoxins  CAD- Moderate LAD disease with moderate to severe ostial first diagonal disease by cath 07/2013 - not ammendable to PCI. 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    - Hold home furosemide/K - Obtain echo  Essential hypertension BP soft - Continue metoprolol -Hold home furosemide  Mixed hyperlipidemia - Continue simvastatin  Bipolar disorder (manic depression)  (HCC) Not on meds for this at home, no active symptoms  On antiepileptic therapy Patient not reliable historian.  Unclear reason for this med. Not listed in med list at 2022 Neuro appointment.  Maybe this is offlabel treatment for Bipolar d/o? - Continue home Keppra for now  Uncontrolled type 2 diabetes mellitus with hyperglycemia, without long-term current use of insulin (HCC) NO prior records, not on AED at home.  A1c 9.4% - Start SS corrections insulin  Hyponatremia Mild, asymptomatic, improved with fluids          Subjective: Patient has no complaints.  He is trying to watch television.  He has no headache, chest pain, swelling, dyspnea, fever, malaise.  No cough, urinary irritative symptoms.  However patient is an extremely unreliable historian.     Physical Exam: BP 126/68 (BP Location: Right Arm)   Pulse (!) 107   Temp 99 F (37.2 C) (Oral)   Resp 18   Ht 5\' 5"  (1.651 m)   Wt 80.1 kg   SpO2 100%   BMI 29.39 kg/m   Elderly adult male, lying in bed, watching television Irregularly irregular heart rate, tachycardic, no murmurs, no peripheral edema, no JVD Respiratory rate normal, lungs clear without rales or wheezes Abdomen soft, no guarding or tenderness in any quadrant, no ascites.  Mildly distended, looks like baseline habitus. Slightly inattentive, affect flat and blunted, judgment and insight appear impaired at baseline, oriented to self, while still on hospital, but not month, and unable to tell me what he is watching on television.  Speech is dysarthric at baseline.  Face symmetric, upper extremity strength symmetric.      Data Reviewed: Patient metabolic panel shows creatinine down to 1.6, sodium up to 134 CBC shows white blood cell count down to 14, hemoglobin stable at 12 A1c 9.4% TSH normal BNP 1900, echo pending  Family Communication: None present    Disposition: Status is: Inpatient         Author: Alberteen Sam,  MD 06/16/2023 12:12 PM  For on call review [email or web address].

## 2023-06-16 NOTE — Assessment & Plan Note (Addendum)
Na stable

## 2023-06-16 NOTE — Assessment & Plan Note (Addendum)
P/w fever, leukocytosis, renal failure, reported encephalopathy and hypotension >40 mmHg drop from baseline.    UTI and pneumonia ruled out.  CT chest abdomen and pelvis showed a 4 cm perirectal abscess that was not visible on clinical exam. - Continue vancomycin, Flagyl and cefepime - Consult general surgery for source control, appreciate cares - Follow blood cultures

## 2023-06-16 NOTE — Assessment & Plan Note (Addendum)
Not on meds for this at home, no active symptoms

## 2023-06-16 NOTE — Assessment & Plan Note (Addendum)
New onset.  CHA2DS2-Vasc 6 (age, HTN, CHF, DM, vascular disease).  TSH normal.  Echo shows HOCM (previously known).  Normal valves. - Hold heparin perioperatively - Continue metoprolol - Stop Plavix given now on anticoagulation

## 2023-06-16 NOTE — Plan of Care (Signed)

## 2023-06-16 NOTE — Assessment & Plan Note (Signed)
BP soft - Continue metoprolol -Hold home furosemide

## 2023-06-16 NOTE — Progress Notes (Signed)
  Echocardiogram 2D Echocardiogram has been performed.  Milda Smart 06/16/2023, 1:22 PM

## 2023-06-16 NOTE — Assessment & Plan Note (Addendum)
-   Continue metoprolol - Resume simvastatin when able to take PO - Stop Plavix and aspirin given new start Eliquis

## 2023-06-16 NOTE — Assessment & Plan Note (Addendum)
No prior records, not on antiglycemic medications at home.  A1c 9.4% - See above

## 2023-06-16 NOTE — Assessment & Plan Note (Addendum)
BNP markedly elevated, but appears euovlemic, clinically better today Cr better with fluids.    - Hold home furosemide/K - Obtain echo

## 2023-06-16 NOTE — Assessment & Plan Note (Signed)
Patient not reliable historian.  Unclear reason for this med. Not listed in med list at 2022 Neuro appointment.  Maybe this is offlabel treatment for Bipolar d/o? - Resume Keppra when able to take p.o.

## 2023-06-16 NOTE — Assessment & Plan Note (Signed)
Continue simvastatin. 

## 2023-06-17 ENCOUNTER — Inpatient Hospital Stay (HOSPITAL_COMMUNITY): Payer: Medicare (Managed Care)

## 2023-06-17 DIAGNOSIS — E782 Mixed hyperlipidemia: Secondary | ICD-10-CM

## 2023-06-17 DIAGNOSIS — E1110 Type 2 diabetes mellitus with ketoacidosis without coma: Secondary | ICD-10-CM

## 2023-06-17 DIAGNOSIS — N179 Acute kidney failure, unspecified: Secondary | ICD-10-CM | POA: Diagnosis not present

## 2023-06-17 DIAGNOSIS — I4891 Unspecified atrial fibrillation: Secondary | ICD-10-CM | POA: Diagnosis not present

## 2023-06-17 DIAGNOSIS — I10 Essential (primary) hypertension: Secondary | ICD-10-CM

## 2023-06-17 DIAGNOSIS — I421 Obstructive hypertrophic cardiomyopathy: Secondary | ICD-10-CM

## 2023-06-17 DIAGNOSIS — A419 Sepsis, unspecified organism: Secondary | ICD-10-CM | POA: Diagnosis not present

## 2023-06-17 LAB — BASIC METABOLIC PANEL
Anion gap: 20 — ABNORMAL HIGH (ref 5–15)
Anion gap: 17 — ABNORMAL HIGH (ref 5–15)
Anion gap: 11 (ref 5–15)
BUN: 43 mg/dL — ABNORMAL HIGH (ref 8–23)
BUN: 43 mg/dL — ABNORMAL HIGH (ref 8–23)
BUN: 46 mg/dL — ABNORMAL HIGH (ref 8–23)
CO2: 11 mmol/L — ABNORMAL LOW (ref 22–32)
CO2: 13 mmol/L — ABNORMAL LOW (ref 22–32)
CO2: 18 mmol/L — ABNORMAL LOW (ref 22–32)
Calcium: 8.1 mg/dL — ABNORMAL LOW (ref 8.9–10.3)
Calcium: 8 mg/dL — ABNORMAL LOW (ref 8.9–10.3)
Calcium: 7.9 mg/dL — ABNORMAL LOW (ref 8.9–10.3)
Chloride: 104 mmol/L (ref 98–111)
Chloride: 105 mmol/L (ref 98–111)
Chloride: 104 mmol/L (ref 98–111)
Creatinine, Ser: 2.4 mg/dL — ABNORMAL HIGH (ref 0.61–1.24)
Creatinine, Ser: 2.28 mg/dL — ABNORMAL HIGH (ref 0.61–1.24)
Creatinine, Ser: 2.21 mg/dL — ABNORMAL HIGH (ref 0.61–1.24)
GFR, Estimated: 27 mL/min — ABNORMAL LOW (ref >60)
GFR, Estimated: 29 mL/min — ABNORMAL LOW (ref >60)
GFR, Estimated: 30 mL/min — ABNORMAL LOW (ref >60)
Glucose, Bld: 264 mg/dL — ABNORMAL HIGH (ref 70–99)
Glucose, Bld: 255 mg/dL — ABNORMAL HIGH (ref 70–99)
Glucose, Bld: 241 mg/dL — ABNORMAL HIGH (ref 70–99)
Potassium: 5.8 mmol/L — ABNORMAL HIGH (ref 3.5–5.1)
Potassium: 4.9 mmol/L (ref 3.5–5.1)
Potassium: 4.3 mmol/L (ref 3.5–5.1)
Sodium: 135 mmol/L (ref 135–145)
Sodium: 135 mmol/L (ref 135–145)
Sodium: 133 mmol/L — ABNORMAL LOW (ref 135–145)

## 2023-06-17 LAB — GLUCOSE, CAPILLARY
Glucose-Capillary: 254 mg/dL — ABNORMAL HIGH (ref 70–99)
Glucose-Capillary: 233 mg/dL — ABNORMAL HIGH (ref 70–99)
Glucose-Capillary: 237 mg/dL — ABNORMAL HIGH (ref 70–99)
Glucose-Capillary: 226 mg/dL — ABNORMAL HIGH (ref 70–99)
Glucose-Capillary: 208 mg/dL — ABNORMAL HIGH (ref 70–99)
Glucose-Capillary: 212 mg/dL — ABNORMAL HIGH (ref 70–99)
Glucose-Capillary: 197 mg/dL — ABNORMAL HIGH (ref 70–99)
Glucose-Capillary: 166 mg/dL — ABNORMAL HIGH (ref 70–99)
Glucose-Capillary: 196 mg/dL — ABNORMAL HIGH (ref 70–99)
Glucose-Capillary: 159 mg/dL — ABNORMAL HIGH (ref 70–99)
Glucose-Capillary: 143 mg/dL — ABNORMAL HIGH (ref 70–99)
Glucose-Capillary: 160 mg/dL — ABNORMAL HIGH (ref 70–99)
Glucose-Capillary: 173 mg/dL — ABNORMAL HIGH (ref 70–99)
Glucose-Capillary: 132 mg/dL — ABNORMAL HIGH (ref 70–99)

## 2023-06-17 LAB — LACTIC ACID, PLASMA
Lactic Acid, Venous: 3.6 mmol/L (ref 0.5–1.9)
Lactic Acid, Venous: 5.2 mmol/L (ref 0.5–1.9)

## 2023-06-17 LAB — BLOOD GAS, VENOUS
Acid-base deficit: 9.5 mmol/L — ABNORMAL HIGH (ref 0.0–2.0)
Bicarbonate: 13.3 mmol/L — ABNORMAL LOW (ref 20.0–28.0)
O2 Saturation: 98.2 %
Patient temperature: 37.5
pCO2, Ven: 22 mm[Hg] — ABNORMAL LOW (ref 44–60)
pH, Ven: 7.38 (ref 7.25–7.43)
pO2, Ven: 87 mm[Hg] — ABNORMAL HIGH (ref 32–45)

## 2023-06-17 LAB — BLOOD GAS, ARTERIAL
Acid-base deficit: 10.9 mmol/L — ABNORMAL HIGH (ref 0.0–2.0)
Bicarbonate: 12.4 mmol/L — ABNORMAL LOW (ref 20.0–28.0)
O2 Content: 2 L/min
O2 Saturation: 92.8 %
Patient temperature: 39.5
pCO2 arterial: 25 mm[Hg] — ABNORMAL LOW (ref 32–48)
pH, Arterial: 7.32 — ABNORMAL LOW (ref 7.35–7.45)
pO2, Arterial: 76 mm[Hg] — ABNORMAL LOW (ref 83–108)

## 2023-06-17 LAB — CBC
HCT: 35.1 % — ABNORMAL LOW (ref 39.0–52.0)
Hemoglobin: 11.6 g/dL — ABNORMAL LOW (ref 13.0–17.0)
MCH: 29.4 pg (ref 26.0–34.0)
MCHC: 33 g/dL (ref 30.0–36.0)
MCV: 88.9 fL (ref 80.0–100.0)
Platelets: 263 10*3/uL (ref 150–400)
RBC: 3.95 MIL/uL — ABNORMAL LOW (ref 4.22–5.81)
RDW: 13.2 % (ref 11.5–15.5)
WBC: 22.4 10*3/uL — ABNORMAL HIGH (ref 4.0–10.5)
nRBC: 0 % (ref 0.0–0.2)

## 2023-06-17 LAB — COMPREHENSIVE METABOLIC PANEL
ALT: 79 U/L — ABNORMAL HIGH (ref 0–44)
AST: 123 U/L — ABNORMAL HIGH (ref 15–41)
Albumin: 3 g/dL — ABNORMAL LOW (ref 3.5–5.0)
Alkaline Phosphatase: 49 U/L (ref 38–126)
Anion gap: 17 — ABNORMAL HIGH (ref 5–15)
BUN: 40 mg/dL — ABNORMAL HIGH (ref 8–23)
CO2: 13 mmol/L — ABNORMAL LOW (ref 22–32)
Calcium: 8 mg/dL — ABNORMAL LOW (ref 8.9–10.3)
Chloride: 100 mmol/L (ref 98–111)
Creatinine, Ser: 2.26 mg/dL — ABNORMAL HIGH (ref 0.61–1.24)
GFR, Estimated: 29 mL/min — ABNORMAL LOW (ref >60)
Glucose, Bld: 231 mg/dL — ABNORMAL HIGH (ref 70–99)
Potassium: 5.8 mmol/L — ABNORMAL HIGH (ref 3.5–5.1)
Sodium: 130 mmol/L — ABNORMAL LOW (ref 135–145)
Total Bilirubin: 1 mg/dL (ref 0.3–1.2)
Total Protein: 7.6 g/dL (ref 6.5–8.1)

## 2023-06-17 LAB — BETA-HYDROXYBUTYRIC ACID
Beta-Hydroxybutyric Acid: 3.47 mmol/L — ABNORMAL HIGH (ref 0.05–0.27)
Beta-Hydroxybutyric Acid: 0.32 mmol/L — ABNORMAL HIGH (ref 0.05–0.27)

## 2023-06-17 LAB — APTT: aPTT: 74 s — ABNORMAL HIGH (ref 24–36)

## 2023-06-17 LAB — HEPARIN LEVEL (UNFRACTIONATED): Heparin Unfractionated: >1.1 [IU]/mL — ABNORMAL HIGH (ref 0.30–0.70)

## 2023-06-17 MED ORDER — ORAL CARE MOUTH RINSE: 15.0000 mL | OROMUCOSAL | Status: DC | PRN

## 2023-06-17 MED ORDER — INSULIN REGULAR(HUMAN) IN NACL 100-0.9 UT/100ML-% IV SOLN
INTRAVENOUS | Status: AC
  Administered 2023-06-17: 3.4 [IU]/h via INTRAVENOUS
  Filled 2023-06-17 (×2): qty 100

## 2023-06-17 MED ORDER — CHLORHEXIDINE GLUCONATE CLOTH 2 % EX PADS
6.0000 | MEDICATED_PAD | Freq: Every day | CUTANEOUS | Status: DC
  Administered 2023-06-18: 6 via TOPICAL

## 2023-06-17 MED ORDER — METOPROLOL TARTRATE 5 MG/5ML IV SOLN
2.5000 mg | Freq: Four times a day (QID) | INTRAVENOUS | Status: DC
  Administered 2023-06-17 – 2023-06-18 (×4): 2.5 mg via INTRAVENOUS
  Filled 2023-06-17 (×5): qty 5

## 2023-06-17 MED ORDER — DEXTROSE 50 % IV SOLN
0.0000 mL | INTRAVENOUS | Status: DC | PRN
  Administered 2023-06-18: 25 mL via INTRAVENOUS
  Filled 2023-06-17: qty 50

## 2023-06-17 MED ORDER — ACETAMINOPHEN 650 MG RE SUPP
650.0000 mg | RECTAL | Status: DC | PRN
  Administered 2023-06-17: 650 mg via RECTAL
  Filled 2023-06-17: qty 1

## 2023-06-17 MED ORDER — VANCOMYCIN HCL IN DEXTROSE 1-5 GM/200ML-% IV SOLN
1000.0000 mg | INTRAVENOUS | Status: DC
  Administered 2023-06-17: 1000 mg via INTRAVENOUS
  Filled 2023-06-17: qty 200

## 2023-06-17 MED ORDER — FUROSEMIDE 10 MG/ML IJ SOLN
40.0000 mg | Freq: Once | INTRAMUSCULAR | Status: AC
  Administered 2023-06-17: 40 mg via INTRAVENOUS
  Filled 2023-06-17: qty 4

## 2023-06-17 MED ORDER — INSULIN GLARGINE-YFGN 100 UNIT/ML ~~LOC~~ SOLN
20.0000 [IU] | SUBCUTANEOUS | Status: DC
  Administered 2023-06-17 – 2023-06-22 (×6): 20 [IU] via SUBCUTANEOUS
  Filled 2023-06-17 (×8): qty 0.2

## 2023-06-17 MED ORDER — LACTATED RINGERS IV SOLN: INTRAVENOUS | Status: DC

## 2023-06-17 MED ORDER — CHLORHEXIDINE GLUCONATE CLOTH 2 % EX PADS
6.0000 | MEDICATED_PAD | Freq: Every day | CUTANEOUS | Status: AC
  Administered 2023-06-17 – 2023-06-21 (×4): 6 via TOPICAL

## 2023-06-17 MED ORDER — MUPIROCIN 2 % EX OINT
1.0000 | TOPICAL_OINTMENT | Freq: Two times a day (BID) | CUTANEOUS | Status: AC
  Administered 2023-06-17 – 2023-06-21 (×9): 1 via NASAL
  Filled 2023-06-17 (×2): qty 22

## 2023-06-17 MED ORDER — CHLORHEXIDINE GLUCONATE CLOTH 2 % EX PADS: 6.0000 | MEDICATED_PAD | Freq: Every day | CUTANEOUS | Status: DC

## 2023-06-17 MED ORDER — SODIUM CHLORIDE 0.9 % IV SOLN
2.0000 g | INTRAVENOUS | Status: DC
  Administered 2023-06-18: 2 g via INTRAVENOUS
  Filled 2023-06-17: qty 12.5

## 2023-06-17 MED ORDER — ORAL CARE MOUTH RINSE
15.0000 mL | OROMUCOSAL | Status: DC
  Administered 2023-06-17 – 2023-07-18 (×114): 15 mL via OROMUCOSAL

## 2023-06-17 MED ORDER — INSULIN ASPART 100 UNIT/ML IJ SOLN
0.0000 [IU] | INTRAMUSCULAR | Status: DC
  Administered 2023-06-17: 3 [IU] via SUBCUTANEOUS
  Administered 2023-06-17 – 2023-06-18 (×2): 2 [IU] via SUBCUTANEOUS
  Administered 2023-06-18: 3 [IU] via SUBCUTANEOUS
  Administered 2023-06-19 (×2): 2 [IU] via SUBCUTANEOUS

## 2023-06-17 MED ORDER — DEXTROSE IN LACTATED RINGERS 5 % IV SOLN: INTRAVENOUS | Status: DC

## 2023-06-17 MED ORDER — HEPARIN (PORCINE) 25000 UT/250ML-% IV SOLN
1350.0000 [IU]/h | INTRAVENOUS | Status: DC
  Administered 2023-06-17: 1200 [IU]/h via INTRAVENOUS
  Filled 2023-06-17: qty 250

## 2023-06-17 MED ORDER — ACETAMINOPHEN 325 MG PO TABS: 650.0000 mg | ORAL_TABLET | ORAL | Status: DC | PRN

## 2023-06-17 NOTE — Inpatient Diabetes Management (Signed)
Inpatient Diabetes Program Recommendations  AACE/ADA: New Consensus Statement on Inpatient Glycemic Control (2015)  Target Ranges:  Prepandial:   less than 140 mg/dL      Peak postprandial:   less than 180 mg/dL (1-2 hours)      Critically ill patients:  140 - 180 mg/dL   Lab Results  Component Value Date   GLUCAP 197 (H) 06/17/2023   HGBA1C 9.4 (H) 06/15/2023    Review of Glycemic Control  Diabetes history: New-onset DM Outpatient Diabetes medications: None Current orders for Inpatient glycemic control: IV insulin per [employer] for DKA  HgbA1C - 9.4%  Inpatient Diabetes Program Recommendations:    Consider Semglee 12 units Q24H  Novolog 0-9 TID Q4H  Will speak with pt regarding his new diagnosis of DM2 when appropriate.  Discussed with RN.  Thank you. Ailene Ards, RD, LDN, CDCES Inpatient Diabetes Coordinator [phone]

## 2023-06-17 NOTE — Progress Notes (Signed)
Progress Note   Patient: Eric Zamora ZOX:[id number] DOB: Sep 03, 1945 DOA: 06/15/2023     2 DOS: the patient was seen and examined on 06/17/2023 at 8:49 AM      Brief hospital course: Mr. Sterchi is a 77 y.o. 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, CAD, dCHF, HTN, hx DVT no longer on AC, and HLD who presented with tachypnea, fever.    Significant events: 10/15: Sent from SNF for tachpynea, found to have fever, no localizing source, admitted on antibiotics 10/17: Overnight with fever, aspiration event, new AG acidosis, transferred back to SDU for BiPAP and insulin drip    Significant studies: 10/15 CXR: no acute disease 10/15 CT head: NAICP 10/16 Echo: HOCM, normal EF, normal valves 10/17 CXR: new infiltrates c/w aspiration   Significant microbiology data: 10/15 Blood cx x2: NGTD 10/16 COVID: Neg 10/16 RVP: Neg 10/17 Repeat blood cx: NGTD           Assessment and Plan: * Severe sepsis (HCC) P/w fever, leukocytosis, renal failure, reported encephalopathy and hypotension >40 mmHg drop from baseline.  Suspect infection.  CXR clear on admission, UA with RBCs, but no nitrites, WBC, bacteria.  COVID and RVP negative. LFTs trending up but abdomen benign.   Overnight, spiked fever.  Shortly after, observed to aspirate by nursing then developed respiratory distress.  Follow up CXR showed aspiration which was not present on admission. - Continue vancomycin, Flagyl and cefepime - Obtain CT chest abdomen and pelvis - Follow blood cultures - Trend LFTs      Diabetic ketoacidosis without coma associated with type 2 diabetes mellitus (HCC) Overnight, in setting of respiratory distress, screening BMP showed development of new anion gap acidosis.  Started on insulin drip and transferred back to SDU.  Gap improved but BOHB still elevated this morning - Continue insulin drip - Trend BOHB and anion gap    Dysphagia Aspirated  overnight.  Seen by SLP this morning and immediatley observed to aspirate water.  No focal symptoms to suggest stroke, I suspect this is due to weakness from sepsis. - NPO for now - IVF - MBS   Atrial fibrillation with rapid ventricular response (HCC) New onset.  CHA2DS2-Vasc 6 (age, HTN, CHF, DM, vascular disease).  TSH normal.  Echo shows HOCM (previously known).  Normal valves. - Transition to heparin while NPO - Continue metoprolol - Stop Plavix given now on anticoagulation    Acute renal failure superimposed on stage 3b chronic kidney disease (HCC) Cr 1.99 on admission from baseline ~1.3.  Improved first night with IV fluids to 1.6, but back up overnight to 2.2 - Trend Cr - Avoid nephrotoxins  CAD- Moderate LAD disease with moderate to severe ostial first diagonal disease by cath 07/2013 - not ammendable to PCI. 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  No clinical congestive heart failure, suspect this infiltrates on chest x-ray or aspiration. - Hold home furosemide/K   Essential hypertension BP normal - Continue metoprolol - Hold home furosemide  Mixed hyperlipidemia - Resume simvastatin when able to take p.o.  Bipolar disorder (manic depression) (HCC) Not on meds for this at home, no active symptoms  On antiepileptic therapy Patient not reliable historian.  Unclear reason for this med. Not listed in  med list at 2022 Neuro appointment.  Maybe this is offlabel treatment for Bipolar d/o? - Resume Keppra when able to take p.o.  Uncontrolled type 2 diabetes mellitus with hyperglycemia, without long-term current use of insulin (HCC) No prior records, not on antiglycemic medications at home.  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  No symptoms of chest pain, syncope reported.  At the moment I do not suspect CHF. - Continue metoprolol           Subjective: Patient developed DKA and respiratory distress after aspirating overnight.  He is able to come off the BiPAP this morning and is stable on supplemental oxygen, but is weak and tired, and a poor historian.  He reports no specific complaints.     Physical Exam: BP 139/82   Pulse (!) 109   Temp 100.2 F (37.9 C) (Axillary)   Resp (!) 23   Ht 5\' 5"  (1.651 m)   Wt 80.1 kg   SpO2 98%   BMI 29.39 kg/m   Overweight adult male, lying in bed, sluggish, poorly responsive Tachycardic, irregular, no murmurs, no peripheral edema, no JVD Respiratory rate increased, lungs diminished, I do not appreciate rales or wheezes although he does not cooperate well with exam Abdomen distended, voluntary guarding throughout but no tenderness apparent Inattentive, affect blunted, poorly cooperative with exam, oriented to self only, speech dysarthric but again at baseline, face symmetric, upper extremity strength seems symmetric but he does not cooperate well    Data Reviewed: Basic metabolic panel overnight shows acidosis, creatinine up to 2.4, bicarb down to 13 LFTs are trending up Beta-hydroxybutyrate is elevated CBC shows leukocytosis Echocardiogram, discussed with cardiology, shows hypertrophic obstructive cardiomyopathy      Disposition: Status is: Inpatient         Author: Alberteen Sam, MD 06/17/2023 1:13 PM  For on call review [email or web address].

## 2023-06-17 NOTE — Assessment & Plan Note (Signed)
Noted on prior TEE 10 years ago.  No symptoms of chest pain, syncope reported.  At the moment I do not suspect CHF. - Continue metoprolol

## 2023-06-17 NOTE — Significant Event (Signed)
Rapid Response Event Note   Reason for Call :  Increased WOB and AMS, fever 103.1  Initial Focused Assessment:  Patient alert and oriented to self, able to tell me his first name. Patient just received tylenol in applesauce as rapid RN walked into patient room, possible aspiration event occurred. On assessment, lung sounds are clear, but patient using accessory muscle to breathe. Patient unable to keep eyes open when speaking, pupils reactive and equal. Grip weak bilateral but equal. Patient received neb treatment earlier in the shift. Rapid response RN paged on-call NP, NP en route to bedside to assess. 2L [place] and ice packs were placed on patient at this time. On-call NP ordered CXR and ABG to further assess patient condition.   Interventions:  ABG, CXR, lactic acid  Plan of Care:  Awaiting ABG, continue monitoring on progressive unit   Event Summary:   MD Notified: Larwance Rote, NP Call Time: 0001 Arrival Time: 0002 End Time: 1660  Odette Horns, RN

## 2023-06-17 NOTE — Progress Notes (Addendum)
Clinical/Bedside Swallow Evaluation Patient Details  Name: Eric Zamora MRN: [id number] Date of Birth: August 16, 1946  Today's Date: 06/17/2023 Time: SLP Start Time (ACUTE ONLY): 0950 SLP Stop Time (ACUTE ONLY): 1018 SLP Time Calculation (min) (ACUTE ONLY): 28 min  Past Medical History:  Past Medical History:  Diagnosis Date   Alzheimer disease (HCC)    Bipolar affective disorder (HCC)    Chronic renal insufficiency    Dementia (HCC)    Dyslipidemia    HTN (hypertension)    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   gradient on TEE   Syncopal episodes    Past Surgical History:  Past Surgical History:  Procedure Laterality Date   LEFT HEART CATHETERIZATION WITH CORONARY ANGIOGRAM N/A 07/06/2013   Procedure: LEFT HEART CATHETERIZATION WITH CORONARY ANGIOGRAM;  Surgeon: Marykay Lex, MD;  Location: [facility] CATH LAB;  Service: Cardiovascular;  Laterality: N/A;   HPI:  77 yo male adm with AMS, foul urine odor and dyspnea.  Pt is a resident of [address].  He experienced an aspiration episode yesterday with medications with applesauce and then experienced fever and respiratory difficulties requiring Bipap. Pt has h/o falls, dementia, Afib, CHF. CXR  06/17/2023 Patchy opacities throughout the lungs bilaterally, predominantly dependent, which are concerning for aspiration pneumonia/pneumonitis given the clinical concern for aspiration. 2. Small bilateral pleural effusions (right greater than left). 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  Swallow eval ordered.    Assessment / Plan / Recommendation  Clinical Impression  RN reached out to SLP and advised plan for trying to feed patient thus requesting SLP conduct evaluation.  Upon arrival to room patient sleeping but he did open his eyes briefly and participated in conversation.  Respiratory rate up to 30s, rapid and shallow and patient demonstrates significant dysarthria with nearly  unintelligible articulation.  He is able to follow directions for lingual protrusion and to seal lips on spoon as well as cough on command.  SLP unable to view palatal elevation due to patient's posterior lingual musculature preventing view.  Patient admitted he was thirsty and requested water.  Provided him with teaspoon of water with patient producing overt prolonged coughing episode with second teaspoon.  His swallow is severely delayed and patient endorsed discomfort with coughing event.  In addition cardiac variability is noted on monitor with suspected aspiration. Pt agreeable to hold on po due to overt discomfort with aspiration.    SLP phoned skilled nursing facility and spoke to Parnell who has worked with patient for 11 years regarding his swallow function.  She reports patient was on a chopped diet prior to admission he fed himself without any issues and was able to ambulate independently.  She denies patient having issues breathing in the past except when he had issues with seizures.  Crystal endorses that patient's shortness of breath prompted hospital admission.    At this time recommend n.p.o. with adequate oral care and conduct MBS when patient's mentation.   and respiratory status allows given his dementia with mental status changes and potential subtle neuro based dysphagia. SLP Visit Diagnosis: Dysphagia, oropharyngeal phase (R13.12);Dysphagia, unspecified (R13.10)    Aspiration Risk  Severe aspiration risk    Diet Recommendation NPO    Medication Administration: Via alternative means    Other  Recommendations Oral Care Recommendations: Oral care QID    Recommendations for follow up therapy are one component of a multi-disciplinary discharge planning process, led by the attending physician.  Recommendations  may be updated based on patient status, additional functional criteria and insurance authorization.  Follow up Recommendations   tbd     Assistance Recommended at  Discharge  tbd  Functional Status Assessment Patient has had a recent decline in their functional status and demonstrates the ability to make significant improvements in function in a reasonable and predictable amount of time.  Frequency and Duration min 1 x/week  1 week       Prognosis Prognosis for improved oropharyngeal function: Fair Barriers to Reach Goals: Cognitive deficits      Swallow Study   General Date of Onset: 06/17/23 HPI: 77 yo male adm with AMS, foul urine odor and dyspnea.  Pt is a resident of [address].  He experienced an aspiration episode yesterday with medications with applesauce and then experienced fever and respiratory difficulties requiring Bipap. Pt has h/o falls, dementia, Afib, CHF. CXR  06/17/2023 Patchy opacities throughout the lungs bilaterally, predominantly dependent, which are concerning for aspiration pneumonia/pneumonitis given the clinical concern for aspiration. 2. Small bilateral pleural effusions (right greater than left). 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  Swallow eval ordered. Type of Study: Bedside Swallow Evaluation Previous Swallow Assessment: none in epic Diet Prior to this Study: NPO Temperature Spikes Noted: Yes Respiratory Status: Nasal cannula (3 liters) History of Recent Intubation: No Behavior/Cognition: Lethargic/Drowsy;Requires cueing Oral Cavity Assessment: Within Functional Limits Oral Care Completed by SLP: Yes Oral Cavity - Dentition: Edentulous Vision: Impaired for self-feeding Self-Feeding Abilities: Total assist Patient Positioning: Upright in bed Baseline Vocal Quality: Low vocal intensity Volitional Cough: Weak Volitional Swallow: Unable to elicit    Oral/Motor/Sensory Function Overall Oral Motor/Sensory Function: Generalized oral weakness   Ice Chips Ice chips: Not tested   Thin Liquid Thin Liquid: Impaired Presentation: Spoon Oral Phase Impairments: Poor  awareness of bolus;Reduced lingual movement/coordination Oral Phase Functional Implications: Other (comment) Pharyngeal  Phase Impairments: Suspected delayed Swallow;Cough - Immediate    Nectar Thick Nectar Thick Liquid: Not tested   Honey Thick Honey Thick Liquid: Not tested   Puree Puree: Not tested   Solid     Solid: Not tested      [name] 06/17/2023,10:37 AM   [name], MS [facility] SLP Acute Rehab Services Office [phone]

## 2023-06-17 NOTE — Consult Note (Signed)
PHARMACY - ANTICOAGULATION CONSULT NOTE  Pharmacy Consult for heparin IV Indication: atrial fibrillation  No Known Allergies  Patient Measurements: Height: 5\' 5"  (165.1 cm) Weight: 80.1 kg (176 lb 9.4 oz) IBW/kg (Calculated) : 61.5 Heparin Dosing Weight: 77.8 kg   Vital Signs: Temp: 100.2 F (37.9 C) (10/17 1107) Temp Source: Axillary (10/17 1107) BP: 139/82 (10/17 1200) Pulse Rate: 109 (10/17 1200)  Labs: Recent Labs    06/15/23 1040 06/15/23 1305 06/15/23 1305 06/16/23 0300 06/16/23 0438 06/17/23 0116 06/17/23 0424 06/17/23 0646 06/17/23 0933  HGB  --  11.8*   < > 12.7*  --  11.6*  --   --   --   HCT  --  35.1*  --  38.3*  --  35.1*  --   --   --   PLT  --  198  --  188  --  263  --   --   --   APTT 22*  --   --   --   --   --   --   --   --   LABPROT 15.8*  --   --   --   --   --   --   --   --   INR 1.2  --   --   --   --   --   --   --   --   CREATININE 1.99*  --   --   --    < > 2.26* 2.40* 2.28* 2.21*   < > = values in this interval not displayed.    Estimated Creatinine Clearance: 26.8 mL/min (A) (by C-G formula based on SCr of 2.21 mg/dL (H)).   Medical History: Past Medical History:  Diagnosis Date   Alzheimer disease (HCC)    Bipolar affective disorder (HCC)    Chronic renal insufficiency    Dementia (HCC)    Dyslipidemia    HTN (hypertension)    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   gradient on TEE   Syncopal episodes      Assessment: Patient is a 77 y.o. male admitted 06/15/2023 with sepsis. PMH includes hx of DVT, dementia, CKD IIIb, dCHF, NSTEMI, and HTN. EKG found new onset Afib and patient was started on apixaban 5 mg BID. CHA2DS2-Vasc 6 (age, HTN, CHF, DM, vascular disease). Plts 263, Hgb 11.6, INR 1.2. No bleeding reported. Today patient is NPO and pharmacy is being consulted to transition to IV heparin.  Goal of Therapy:  Heparin level 0.3-0.7 units/ml aPTT 66-102 seconds Monitor platelets by anticoagulation protocol:  Yes   Plan:  --Ordered baseline aPTT and heparin level  --No heparin bolus  --Initiate IV heparin at 1200 units/hr  --Check heparin level and aPTT at 8 hours after initiation until they are aligned  --Daily CBC and heparin level  --Monitor for signs and symptoms of bleeding   Karlton Lemon, PharmD Candidate  06/17/2023,12:54 PM

## 2023-06-17 NOTE — TOC Initial Note (Signed)
Transition of Care [facility]) - Initial/Assessment Note    Patient Details  Name: Eric Zamora MRN: [id number] Date of Birth: [birth year]  Transition of [facility]) CM/SW Contact:    Darleene Cleaver, LCSW Phone Number: 06/17/2023, 5:20 PM  Clinical Narrative:                  Patient is a 77 year old male who is alert and oriented x1.  Patient lives at [facility] care ALF.  Patient has a legal guardian, Eric Zamora, [phone].  CSW completed assessment by reviewing chart due to patient having dementia.  Patient has been living at ALF, however per report from EMS, patient has had some confusion, and has had an increase in assistance with ADLs.  Patient is in ICU, PT unable to work with him at this time.  PT and OT will have to evaluate to see if he needs SNF for short term rehab first or can return to ALF.  TOC will need to contact [facility] memory care facility to discuss disposition with patient's legal guardian and the facility.  TOC to continue to follow patient's progress throughout discharge planning.  Expected Discharge Plan: Skilled Nursing Facility Barriers to Discharge: Continued Medical Work up   Patient Goals and CMS Choice Patient states their goals for this hospitalization and ongoing recovery are:: Patient has dementia, unable to answer.          Expected Discharge Plan and Services   SNF verse returning to [facility].   Post Acute Care Choice: Skilled Nursing Facility Living arrangements for the past 2 months: Assisted Living Facility [facility])                                      Prior Living Arrangements/Services Living arrangements for the past 2 months: Assisted Living Facility [facility]) Lives with:: Facility Resident Patient language and need for interpreter reviewed:: Yes Do you feel safe going back to the place where you live?: No   Patient may need some rehab prior  to returning back to [facility] ALF.  Need for Family Participation in Patient Care: Yes (Comment) Care giver support system in place?: Yes (comment)   Criminal Activity/Legal Involvement Pertinent to Current Situation/Hospitalization: No - Comment as needed  Activities of Daily Living   ADL Screening (condition at time of admission) Independently performs ADLs?: No Does the patient have a NEW difficulty with bathing/dressing/toileting/self-feeding that is expected to last >3 days?: No Does the patient have a NEW difficulty with getting in/out of bed, walking, or climbing stairs that is expected to last >3 days?: No Does the patient have a NEW difficulty with communication that is expected to last >3 days?: No Is the patient deaf or have difficulty hearing?: No Does the patient have difficulty seeing, even when wearing glasses/contacts?: No Does the patient have difficulty concentrating, remembering, or making decisions?: Yes  Permission Sought/Granted Permission sought to share information with : Case Manager, Family Supports, Magazine features editor Permission granted to share information with : Yes, Verbal Permission Granted, Yes, Release of Information Signed  Share Information with NAME: [facility] Legal Guardian   [phone]           Emotional Assessment Appearance:: Appears stated age Attitude/Demeanor/Rapport: Other (comment) Affect (typically observed): Accepting, Appropriate Orientation: : Oriented to Self Alcohol / Substance Use:  Not Applicable Psych Involvement: Yes (comment)  Admission diagnosis:  Severe sepsis (HCC) [A41.9, R65.20] Altered mental status, unspecified altered mental status type [R41.82] Sepsis, due to unspecified organism, unspecified whether acute organ dysfunction present [facility]) [A41.9] Patient Active Problem List   Diagnosis Date Noted   Diabetic ketoacidosis without coma associated with type 2 diabetes mellitus (HCC)  06/17/2023   Acute renal failure superimposed on stage 3b chronic kidney disease (HCC) 06/16/2023   Mixed hyperlipidemia 06/16/2023   Atrial fibrillation with rapid ventricular response (HCC) 06/16/2023   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 [place] [address] Phone: [phone] Fax: [phone]     Social Determinants of Health (SDOH) Social History: SDOH Screenings   Food Insecurity: Patient Unable To Answer (06/15/2023)  Housing: Low Risk  (06/15/2023)  Transportation Needs: Patient Unable To Answer (06/15/2023)  Utilities: Patient Unable To Answer (06/15/2023)  Tobacco Use: Low Risk  (06/15/2023)   SDOH Interventions:     Readmission Risk Interventions     No data to display

## 2023-06-17 NOTE — Progress Notes (Signed)
   06/16/23 2359  Assess: MEWS Score  Temp (!) 103.1 F (39.5 C)  BP 92/63  MAP (mmHg) 70  Pulse Rate 100  Resp (!) 33  Level of Consciousness Alert  Assess: MEWS Score  MEWS Temp 2  MEWS Systolic 1  MEWS Pulse 0  MEWS RR 2  MEWS LOC 0  MEWS Score 5  MEWS Score Color Red  Assess: SIRS CRITERIA  SIRS Temperature  1  SIRS Pulse 1  SIRS Respirations  1  SIRS WBC 0  SIRS Score Sum  3   This Rn notified RR/Np for new onset temp with progressive tachypnea/WOB through the shift. Pt alert to person and closes eyes dozes off during conversation. Pt given PO tylenol; ice packs. Np placed orders for CXR/ABG/chemistry labs to evaluate. RN administered 1 dose Lasix prior to Chemistry lab resulting see MAR. Pt transferred to ICU for higher level of care and observation. Report called to RN taking PT. Plan of care ongoing.

## 2023-06-17 NOTE — Progress Notes (Signed)
   06/17/23 0900  Pre-Screen- If "YES" to any of the following, STOP the screen, keep NPO, and place order for SLP eval and treat.   Home diet required thickened liquids No - Proceed  Trach tube present No - Proceed  Radiation to Head/Neck No - Proceed  Patient Readiness- If "YES" to any of the following, WAIT to screen, keep NPO, and place order for SLP eval and treat. May rescreen if clinical improvement WITHIN 24h.    Is patient lethargic or unable to stay alert/awake? No - Proceed  HOB restricted to <30 degrees No - Proceed  NPO for planned procedure No - Proceed  Aspiration Risk Assessment  Is patient oriented to name, place, or year? Yes - Proceed  Able to open mouth, stick out tongue, or smile No - Proceed  Able to seal lips No - Proceed  Able to move tongue from side to side No - Proceed  Face is symmetric Yes - Proceed  Mandatory oral care performed Yes  3 oz Water Challenge  Does the patient stop drinking? No - Proceed  Does the patient cough/choke? (!) Yes Keep NPO/Place SLP Eval  Document PASS or FAIL (!) FAIL Keep NPO/Place SLP Eval Order   Pt started coughing almost immediately after drinking water. SLP to come evaluate patient soon.

## 2023-06-17 NOTE — Progress Notes (Signed)
SLP Cancellation Note  Patient Details Name: Eric Zamora MRN: [id number] DOB: 08/08/1946   Cancelled treatment:       Reason Eval/Treat Not Completed: Medical issues which prohibited therapy (Pt with likely aspiration event over night and now has fever with CXR concerning for pna and pt now requiring Bipap.)SLP will continue efforts, RN concurs with plan.   [name], MS [facility] SLP Acute Rehab Services Office [phone]   [name] 06/17/2023, 7:40 AM

## 2023-06-17 NOTE — Consult Note (Signed)
PHARMACY - ANTICOAGULATION CONSULT NOTE  Pharmacy Consult for heparin IV Indication: atrial fibrillation  No Known Allergies  Patient Measurements: Height: 5\' 5"  (165.1 cm) Weight: 80.1 kg (176 lb 9.4 oz) IBW/kg (Calculated) : 61.5 Heparin Dosing Weight: 77.8 kg   Vital Signs: Temp: 99.2 F (37.3 C) (10/17 1924) Temp Source: Oral (10/17 1924) BP: 132/74 (10/17 2200) Pulse Rate: 98 (10/17 2300)  Labs: Recent Labs    06/15/23 1040 06/15/23 1305 06/15/23 1305 06/16/23 0300 06/16/23 0438 06/17/23 0116 06/17/23 0424 06/17/23 0646 06/17/23 0933 06/17/23 2159  HGB  --  11.8*   < > 12.7*  --  11.6*  --   --   --   --   HCT  --  35.1*  --  38.3*  --  35.1*  --   --   --   --   PLT  --  198  --  188  --  263  --   --   --   --   APTT 22*  --   --   --   --   --   --   --   --  74*  LABPROT 15.8*  --   --   --   --   --   --   --   --   --   INR 1.2  --   --   --   --   --   --   --   --   --   HEPARINUNFRC  --   --   --   --   --   --   --   --   --  >1.10*  CREATININE 1.99*  --   --   --    < > 2.26* 2.40* 2.28* 2.21*  --    < > = values in this interval not displayed.    Estimated Creatinine Clearance: 26.8 mL/min (A) (by C-G formula based on SCr of 2.21 mg/dL (H)).   Medical History: Past Medical History:  Diagnosis Date   Alzheimer disease (HCC)    Bipolar affective disorder (HCC)    Chronic renal insufficiency    Dementia (HCC)    Dyslipidemia    HTN (hypertension)    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   gradient on TEE   Syncopal episodes      Assessment: Patient is a 77 y.o. male admitted 06/15/2023 with sepsis. PMH includes hx of DVT, dementia, CKD IIIb, dCHF, NSTEMI, and HTN. EKG found new onset Afib and patient was started on apixaban 5 mg BID. CHA2DS2-Vasc 6 (age, HTN, CHF, DM, vascular disease). Plts 263, Hgb 11.6, INR 1.2. No bleeding reported. Today patient is NPO and pharmacy is being consulted to transition to IV heparin.  aPTT 74,  therapeutic on 1200 units/hr HL > 1.1 as expected with apixaban on board  Goal of Therapy:  Heparin level 0.3-0.7 units/ml aPTT 66-102 seconds Monitor platelets by anticoagulation protocol: Yes   Plan:  --continue IV heparin at 1200 units/hr  --Check heparin level and aPTT in 8 hours --Daily CBC and heparin level  --Monitor for signs and symptoms of bleeding   Arley Phenix RPh 06/17/2023, 11:18 PM

## 2023-06-17 NOTE — Assessment & Plan Note (Addendum)
This developed overnight 10/17.  Was on insulin drip for about 24 hours, transition back to subcutaneous insulin 10/17 in the evening  -Continue glargine - Sliding scale corrections every 4

## 2023-06-17 NOTE — Progress Notes (Signed)
Pharmacy Antibiotic Note  Eric Zamora is a 77 y.o. male admitted on 06/15/2023 with sepsis.  Pharmacy has been consulted for vancomycin and cefepime dosing.  06/17/2023 Possible aspiration event during the night with temp increase to 103 F and WBC 14.5>22.4. Creatinine increased to 2.28 (CrCl calculated to be 26 ml/min). Lactic acid now 5.2 and patient on 3 L [place]. Patient currently on flagyl, cefepime, and vancomycin for broad abx coverage. Due to renal function decline, vancomycin and cefepime dosing need to be recalculated.   Plan: Change cefepime dosage to 2 g IV q24h  Change vancomycin dosage to 1000 mg IV q36h (AUC 488, wt of 80.1 kg, Scr 2.28 mg/dl)  Monitor clinical course, renal function, cultures as available    Height: 5\' 5"  (165.1 cm) Weight: 80.1 kg (176 lb 9.4 oz) IBW/kg (Calculated) : 61.5  Temp (24hrs), Avg:100.5 F (38.1 C), Min:97.9 F (36.6 C), Max:103.1 F (39.5 C)  Recent Labs  Lab 06/15/23 1040 06/15/23 1138 06/15/23 1305 06/15/23 1358 06/16/23 0300 06/16/23 0438 06/17/23 0116 06/17/23 0424 06/17/23 0646  WBC  --   --  17.1*  --  14.5*  --  22.4*  --   --   CREATININE 1.99*  --   --   --   --  1.66* 2.26* 2.40* 2.28*  LATICACIDVEN  --  2.9*  --  1.9  --   --  3.6* 5.2*  --     Estimated Creatinine Clearance: 26 mL/min (A) (by C-G formula based on SCr of 2.28 mg/dL (H)).    No Known Allergies  Antimicrobials this admission: 10/15 ceftriaxone x 1  10/15 vancomycin >>  10/15 cefepime  >>  10/15 flagyl >>  Dose adjustments this admission: Vancomycin 750 mg q24 >> 1000 mg q36 Cefepime 2 q q12 >> 2 g q24  Microbiology results: 10/15 BCx:  10/15 MRSA PCR: positive  10/16 Respiratory panel: negative  10/17 BCx:   Karlton Lemon, PharmD Candidate  06/17/2023 8:52 AM

## 2023-06-17 NOTE — Progress Notes (Signed)
Semglee given at 1741. Insulin gtt to be turned off in 2 hours at 1941.

## 2023-06-17 NOTE — Progress Notes (Signed)
CROSS COVER NOTE  NAME: Eric Zamora MRN: [id number] DOB : 05-25-1946    Date of Service   06/17/2023   HPI/Events of Note   Nurse paged because patient spiked a fever to 103 F, patient was also noted to have tachypnea.  Nurse reported that patient possibly aspirated while taking his pills in applesauce.  Patient was placed on 2 L of nasal cannula.  Ice applied to patient. On chart review patient is currently being treated for septicemia.  Patient on broad-spectrum antibiotics.  Blood cultures drawn on admission with no growth after 24 hours.  On assessment patient is alert and oriented and answers questions.  Patient's speech is dysarthric but able to communicate.  Vital signs within defined limits.  On auscultation patient's lung sounds positive for rales at the bilateral bases.  Bed side Interventions    chest x-ray. Labs ordered for an ABG, and lactic acid, bhb, and blood cultures. Patient made n.p.o. until SLP can evaluate in the a.m.      Pertinent Labs  Arterial Blood Gas result:  pO2 76; pCO2 25; pH 7.32;  HCO3 12.4, %O2 Sat 95.   BHB: 3.47 Lactic Acid: 3.6      Latest Ref Rng & Units 06/17/2023    1:16 AM 06/16/2023    4:38 AM 06/15/2023   10:40 AM  BMP  Glucose 70 - 99 mg/dL 284  132  440   BUN 8 - 23 mg/dL 40  32  38   Creatinine 0.61 - 1.24 mg/dL 1.02  7.25  3.66   Sodium 135 - 145 mmol/L 130  134  130   Potassium 3.5 - 5.1 mmol/L 5.8  4.7  4.9   Chloride 98 - 111 mmol/L 100  103  97   CO2 22 - 32 mmol/L 13  18  20    Calcium 8.9 - 10.3 mg/dL 8.0  8.4  8.9     ASSESSMENT/PLAN  Diabetes ketoacidosis Patient's recent A1c was 9.4, and was started on sliding scale insulin for glycemic control on admission.  Patient's glucose was noted to be high and consecutive blood glucose checks with positive glycosuria and ketonuria.  Patient is arterial blood gas results and chemistry panel suggest metabolic acidosis, and his beta hydroxybutyrate was 3.47.  DKA  likely precipitated by septic state.  - Patient started on IV insulin to be titrated by Endo tool - Ordered every 4 hours chemistry panel and Q8 HRS beta hydroxy - Patient made n.p.o. - Maintenance fluids ordered at 50 mL/h due to concern of kidney function and possible new pulmonary congestion. -Transfer order placed to change level of care to stepdown  Respiratory Distress Patient's nurse was concerned for worsening tachypnea and increased work of breathing requiring oxygen supplementation.  Bedside assessment revealed bilateral basilar rales on auscultation.  X-ray obtained at bedside consistent with signs of pulmonary congestion.  Patient takes 40 mg of furosemide orally at home daily, but med has been held on admission due to soft blood pressures.  - Patient placed on nasal cannula oxygen supplementation - Patient was given a one-time dose of 40 mg IV furosemide prior to confirmation of metabolic acidosis - Will consider BiPAP if work of breathing does not improve with acidosis correction  Septicemia Patient spiked a fever of 103 F.  He is currently being treated with cefepime, Flagyl and vancomycin for septicemia. -Tylenol administered and ice packs applied - Lactic acid and blood cultures redrawn - Patient already on  broad-spectrum antibiotics     Eliese Kerwood Lamin Geradine Girt, MSN, APRN, AGACNP-BC [facility] [place] Pager: [phone]. Check Amion for Availability

## 2023-06-18 ENCOUNTER — Encounter (HOSPITAL_COMMUNITY)
Admission: EM | Disposition: A | Discharge status: Skilled Nursing Facility | Payer: Self-pay | Source: Home / Self Care | Attending: Family Medicine

## 2023-06-18 ENCOUNTER — Other Ambulatory Visit: Payer: Self-pay

## 2023-06-18 ENCOUNTER — Encounter (HOSPITAL_COMMUNITY): Payer: Self-pay | Admitting: Internal Medicine

## 2023-06-18 ENCOUNTER — Inpatient Hospital Stay (HOSPITAL_COMMUNITY): Payer: Medicare (Managed Care) | Admitting: Certified Registered"

## 2023-06-18 DIAGNOSIS — I2510 Atherosclerotic heart disease of native coronary artery without angina pectoris: Secondary | ICD-10-CM | POA: Diagnosis not present

## 2023-06-18 DIAGNOSIS — I4891 Unspecified atrial fibrillation: Secondary | ICD-10-CM | POA: Diagnosis not present

## 2023-06-18 DIAGNOSIS — N179 Acute kidney failure, unspecified: Secondary | ICD-10-CM | POA: Diagnosis not present

## 2023-06-18 DIAGNOSIS — K611 Rectal abscess: Secondary | ICD-10-CM

## 2023-06-18 DIAGNOSIS — E1110 Type 2 diabetes mellitus with ketoacidosis without coma: Secondary | ICD-10-CM | POA: Diagnosis not present

## 2023-06-18 DIAGNOSIS — A419 Sepsis, unspecified organism: Secondary | ICD-10-CM | POA: Diagnosis not present

## 2023-06-18 DIAGNOSIS — R7401 Elevation of levels of liver transaminase levels: Secondary | ICD-10-CM | POA: Insufficient documentation

## 2023-06-18 DIAGNOSIS — R1310 Dysphagia, unspecified: Secondary | ICD-10-CM

## 2023-06-18 HISTORY — PX: INCISION AND DRAINAGE PERIRECTAL ABSCESS: SHX1804

## 2023-06-18 LAB — CBC
HCT: 34.9 % — ABNORMAL LOW (ref 39.0–52.0)
Hemoglobin: 11.1 g/dL — ABNORMAL LOW (ref 13.0–17.0)
MCH: 29 pg (ref 26.0–34.0)
MCHC: 31.8 g/dL (ref 30.0–36.0)
MCV: 91.1 fL (ref 80.0–100.0)
Platelets: 218 10*3/uL (ref 150–400)
RBC: 3.83 MIL/uL — ABNORMAL LOW (ref 4.22–5.81)
RDW: 13.5 % (ref 11.5–15.5)
WBC: 16.4 10*3/uL — ABNORMAL HIGH (ref 4.0–10.5)
nRBC: 0 % (ref 0.0–0.2)

## 2023-06-18 LAB — COMPREHENSIVE METABOLIC PANEL
ALT: 186 U/L — ABNORMAL HIGH (ref 0–44)
AST: 299 U/L — ABNORMAL HIGH (ref 15–41)
Albumin: 2.6 g/dL — ABNORMAL LOW (ref 3.5–5.0)
Alkaline Phosphatase: 44 U/L (ref 38–126)
Anion gap: 10 (ref 5–15)
BUN: 37 mg/dL — ABNORMAL HIGH (ref 8–23)
CO2: 18 mmol/L — ABNORMAL LOW (ref 22–32)
Calcium: 7.9 mg/dL — ABNORMAL LOW (ref 8.9–10.3)
Chloride: 110 mmol/L (ref 98–111)
Creatinine, Ser: 1.72 mg/dL — ABNORMAL HIGH (ref 0.61–1.24)
GFR, Estimated: 40 mL/min — ABNORMAL LOW (ref >60)
Glucose, Bld: 85 mg/dL (ref 70–99)
Potassium: 4.2 mmol/L (ref 3.5–5.1)
Sodium: 138 mmol/L (ref 135–145)
Total Bilirubin: 0.8 mg/dL (ref 0.3–1.2)
Total Protein: 6.6 g/dL (ref 6.5–8.1)

## 2023-06-18 LAB — APTT: aPTT: 63 s — ABNORMAL HIGH (ref 24–36)

## 2023-06-18 LAB — GLUCOSE, CAPILLARY
Glucose-Capillary: 129 mg/dL — ABNORMAL HIGH (ref 70–99)
Glucose-Capillary: 79 mg/dL (ref 70–99)
Glucose-Capillary: 163 mg/dL — ABNORMAL HIGH (ref 70–99)
Glucose-Capillary: 127 mg/dL — ABNORMAL HIGH (ref 70–99)
Glucose-Capillary: 166 mg/dL — ABNORMAL HIGH (ref 70–99)
Glucose-Capillary: 115 mg/dL — ABNORMAL HIGH (ref 70–99)

## 2023-06-18 LAB — LACTIC ACID, PLASMA: Lactic Acid, Venous: 1.9 mmol/L (ref 0.5–1.9)

## 2023-06-18 LAB — HEPARIN LEVEL (UNFRACTIONATED): Heparin Unfractionated: >1.1 [IU]/mL — ABNORMAL HIGH (ref 0.30–0.70)

## 2023-06-18 SURGERY — INCISION AND DRAINAGE, ABSCESS, PERIRECTAL
Anesthesia: Monitor Anesthesia Care

## 2023-06-18 MED ORDER — OXYCODONE HCL 5 MG PO TABS: 5.0000 mg | ORAL_TABLET | Freq: Once | ORAL | Status: DC | PRN

## 2023-06-18 MED ORDER — HEPARIN (PORCINE) 25000 UT/250ML-% IV SOLN
1350.0000 [IU]/h | INTRAVENOUS | Status: DC
  Administered 2023-06-18: 1350 [IU]/h via INTRAVENOUS
  Filled 2023-06-18: qty 250

## 2023-06-18 MED ORDER — SODIUM CHLORIDE 0.9 % IV BOLUS
1000.0000 mL | Freq: Once | INTRAVENOUS | Status: AC
  Administered 2023-06-18: 1000 mL via INTRAVENOUS

## 2023-06-18 MED ORDER — FENTANYL CITRATE (PF) 100 MCG/2ML IJ SOLN
INTRAMUSCULAR | Status: AC
  Filled 2023-06-18: qty 2

## 2023-06-18 MED ORDER — PHENYLEPHRINE 80 MCG/ML (10ML) SYRINGE FOR IV PUSH (FOR BLOOD PRESSURE SUPPORT)
PREFILLED_SYRINGE | INTRAVENOUS | Status: DC | PRN
Start: 2023-06-18 — End: 2023-06-18
  Administered 2023-06-18 (×3): 80 ug via INTRAVENOUS
  Administered 2023-06-18: 160 ug via INTRAVENOUS

## 2023-06-18 MED ORDER — OXYCODONE HCL 5 MG PO TABS: 2.5000 mg | ORAL_TABLET | ORAL | Status: DC | PRN

## 2023-06-18 MED ORDER — SODIUM CHLORIDE 0.9 % IR SOLN
Status: DC | PRN
  Administered 2023-06-18: 1000 mL

## 2023-06-18 MED ORDER — ACETAMINOPHEN 325 MG PO TABS
650.0000 mg | ORAL_TABLET | Freq: Four times a day (QID) | ORAL | Status: DC | PRN
  Administered 2023-06-23 – 2023-07-11 (×6): 650 mg via ORAL
  Filled 2023-06-18 (×6): qty 2

## 2023-06-18 MED ORDER — HYDROMORPHONE HCL 1 MG/ML IJ SOLN: 0.2500 mg | INTRAMUSCULAR | Status: DC | PRN

## 2023-06-18 MED ORDER — METOPROLOL TARTRATE 5 MG/5ML IV SOLN
5.0000 mg | Freq: Once | INTRAVENOUS | Status: AC
  Administered 2023-06-18: 5 mg via INTRAVENOUS

## 2023-06-18 MED ORDER — VANCOMYCIN HCL 1250 MG/250ML IV SOLN
1250.0000 mg | INTRAVENOUS | Status: DC
  Administered 2023-06-19: 1250 mg via INTRAVENOUS
  Filled 2023-06-18: qty 250

## 2023-06-18 MED ORDER — PROPOFOL 10 MG/ML IV BOLUS
INTRAVENOUS | Status: DC | PRN
Start: 2023-06-18 — End: 2023-06-18
  Administered 2023-06-18: 100 ug/kg/min via INTRAVENOUS

## 2023-06-18 MED ORDER — OXYCODONE HCL 5 MG/5ML PO SOLN: 5.0000 mg | Freq: Once | ORAL | Status: DC | PRN

## 2023-06-18 MED ORDER — BUPIVACAINE-EPINEPHRINE 0.25% -1:200000 IJ SOLN
INTRAMUSCULAR | Status: DC | PRN
  Administered 2023-06-18: 10 mL

## 2023-06-18 MED ORDER — ONDANSETRON HCL 4 MG/2ML IJ SOLN
INTRAMUSCULAR | Status: AC
  Filled 2023-06-18: qty 2

## 2023-06-18 MED ORDER — INSULIN ASPART 100 UNIT/ML IJ SOLN: 0.0000 [IU] | INTRAMUSCULAR | Status: DC | PRN

## 2023-06-18 MED ORDER — SODIUM CHLORIDE 0.9 % IV SOLN
2.0000 g | Freq: Two times a day (BID) | INTRAVENOUS | Status: DC
  Administered 2023-06-18 – 2023-06-21 (×6): 2 g via INTRAVENOUS
  Filled 2023-06-18 (×7): qty 12.5

## 2023-06-18 MED ORDER — LIDOCAINE HCL (PF) 1 % IJ SOLN
INTRAMUSCULAR | Status: AC
  Filled 2023-06-18: qty 30

## 2023-06-18 MED ORDER — BUPIVACAINE-EPINEPHRINE 0.25% -1:200000 IJ SOLN
INTRAMUSCULAR | Status: AC
  Filled 2023-06-18: qty 1

## 2023-06-18 MED ORDER — METOPROLOL TARTRATE 5 MG/5ML IV SOLN: 2.5000 mg | Freq: Four times a day (QID) | INTRAVENOUS | Status: DC | PRN

## 2023-06-18 MED ORDER — LACTATED RINGERS IV SOLN
INTRAVENOUS | Status: DC | PRN
Start: 2023-06-18 — End: 2023-06-18

## 2023-06-18 SURGICAL SUPPLY — 39 items
ADH SKN CLS APL DERMABOND .7 (GAUZE/BANDAGES/DRESSINGS) ×1
BAG COUNTER SPONGE SURGICOUNT (BAG) IMPLANT
BAG SPNG CNTER NS LX DISP (BAG)
BLADE HEX COATED 2.75 (ELECTRODE) ×1 IMPLANT
BLADE SURG SZ10 CARB STEEL (BLADE) ×2 IMPLANT
DERMABOND ADVANCED .7 DNX12 (GAUZE/BANDAGES/DRESSINGS) ×1 IMPLANT
DRAIN PENROSE 0.25X18 (DRAIN) IMPLANT
DRAPE LAPAROTOMY T 102X78X121 (DRAPES) IMPLANT
DRAPE LAPAROTOMY TRNSV 102X78 (DRAPES) IMPLANT
DRAPE SHEET LG 3/4 BI-LAMINATE (DRAPES) IMPLANT
ELECT REM PT RETURN 15FT ADLT (MISCELLANEOUS) ×1 IMPLANT
EVACUATOR SILICONE 100CC (DRAIN) IMPLANT
GAUZE PACKING IODOFORM 1/4X15 (PACKING) IMPLANT
GAUZE PAD ABD 7.5X8 STRL (GAUZE/BANDAGES/DRESSINGS) IMPLANT
GAUZE SPONGE 4X4 12PLY STRL (GAUZE/BANDAGES/DRESSINGS) ×1 IMPLANT
GLOVE BIO SURGEON STRL SZ7.5 (GLOVE) ×2 IMPLANT
GLOVE BIOGEL PI IND STRL 7.0 (GLOVE) ×1 IMPLANT
GOWN STRL REUS W/ TWL XL LVL3 (GOWN DISPOSABLE) ×2 IMPLANT
GOWN STRL REUS W/TWL XL LVL3 (GOWN DISPOSABLE) ×2
KIT BASIN OR (CUSTOM PROCEDURE TRAY) ×1 IMPLANT
KIT TURNOVER KIT A (KITS) IMPLANT
MARKER SKIN DUAL TIP RULER LAB (MISCELLANEOUS) IMPLANT
NDL HYPO 25X1 1.5 SAFETY (NEEDLE) ×1 IMPLANT
NEEDLE HYPO 25X1 1.5 SAFETY (NEEDLE) ×1 IMPLANT
NS IRRIG 1000ML POUR BTL (IV SOLUTION) ×1 IMPLANT
PACK BASIC VI WITH GOWN DISP (CUSTOM PROCEDURE TRAY) ×1 IMPLANT
PACKING GAUZE IODOFORM 1INX5YD (GAUZE/BANDAGES/DRESSINGS) IMPLANT
PENCIL SMOKE EVACUATOR (MISCELLANEOUS) IMPLANT
SOL PREP POV-IOD 4OZ 10% (MISCELLANEOUS) ×1 IMPLANT
SPIKE FLUID TRANSFER (MISCELLANEOUS) IMPLANT
SPONGE T-LAP 4X18 ~~LOC~~+RFID (SPONGE) ×1 IMPLANT
STAPLER VISISTAT 35W (STAPLE) IMPLANT
SUT ETHILON 2 0 PS N (SUTURE) IMPLANT
SUT MNCRL AB 4-0 PS2 18 (SUTURE) IMPLANT
SUT VIC AB 3-0 SH 18 (SUTURE) IMPLANT
SYR CONTROL 10ML LL (SYRINGE) ×1 IMPLANT
TAPE PAPER 1X10 WHT MICROPORE (GAUZE/BANDAGES/DRESSINGS) IMPLANT
TOWEL OR 17X26 10 PK STRL BLUE (TOWEL DISPOSABLE) ×1 IMPLANT
WATER STERILE IRR 1000ML POUR (IV SOLUTION) ×1 IMPLANT

## 2023-06-18 NOTE — Anesthesia Preprocedure Evaluation (Signed)
Anesthesia Evaluation  Patient identified by MRN, date of birth, ID band Patient awake    Reviewed: Allergy & Precautions, H&P , NPO status , Patient's Chart, lab work & pertinent test results  Airway Mallampati: II  TM Distance: >3 FB Neck ROM: Full    Dental no notable dental hx.    Pulmonary neg pulmonary ROS   Pulmonary exam normal breath sounds clear to auscultation       Cardiovascular hypertension, Pt. on medications + CAD and +CHF  negative cardio ROS Normal cardiovascular exam Rhythm:Regular Rate:Normal     Neuro/Psych     Bipolar Disorder  Dementia negative neurological ROS  negative psych ROS   GI/Hepatic negative GI ROS, Neg liver ROS,,,  Endo/Other  negative endocrine ROSdiabetes    Renal/GU Renal InsufficiencyRenal diseasenegative Renal ROS  negative genitourinary   Musculoskeletal negative musculoskeletal ROS (+)    Abdominal   Peds negative pediatric ROS (+)  Hematology negative hematology ROS (+)   Anesthesia Other Findings   Reproductive/Obstetrics negative OB ROS                             Anesthesia Physical Anesthesia Plan  ASA: 3  Anesthesia Plan: MAC   Post-op Pain Management: Minimal or no pain anticipated   Induction: Intravenous  PONV Risk Score and Plan: 1 and Treatment may vary due to age or medical condition and Propofol infusion  Airway Management Planned: Simple Face Mask  Additional Equipment:   Intra-op Plan:   Post-operative Plan:   Informed Consent: I have reviewed the patients History and Physical, chart, labs and discussed the procedure including the risks, benefits and alternatives for the proposed anesthesia with the patient or authorized representative who has indicated his/her understanding and acceptance.     Dental advisory given  Plan Discussed with: CRNA  Anesthesia Plan Comments:        Anesthesia Quick  Evaluation

## 2023-06-18 NOTE — Progress Notes (Signed)
Consent for surgery was obtained from Eric Zamora, Division of Director of Aging and Adult services and [facility] Contact number for her is [phone].

## 2023-06-18 NOTE — Transfer of Care (Signed)
Immediate Anesthesia Transfer of Care Note  Patient: Eric Zamora  Procedure(s) Performed: IRRIGATION AND DEBRIDEMENT PERIRECTAL ABSCESS  Patient Location: PACU  Anesthesia Type:MAC  Level of Consciousness: drowsy, confused, and responds to stimulation  Airway & Oxygen Therapy: Patient connected to face mask oxygen  Post-op Assessment: Report given to RN and Post -op Vital signs reviewed and stable  Post vital signs: stable  Last Vitals:  Vitals Value Taken Time  BP 137/118 06/18/23 1147  Temp    Pulse 41 06/18/23 1148  Resp 48 06/18/23 1148  SpO2 99 % 06/18/23 1148  Vitals shown include unfiled device data.  Last Pain:  Vitals:   06/18/23 0942  TempSrc:   PainSc: Asleep      Patients Stated Pain Goal: 0 (06/15/23 1600)  Complications: No notable events documented.

## 2023-06-18 NOTE — Consult Note (Signed)
PHARMACY - ANTICOAGULATION CONSULT NOTE  Pharmacy Consult for heparin IV Indication: atrial fibrillation  No Known Allergies  Patient Measurements: Height: 5\' 5"  (165.1 cm) Weight: 80.1 kg (176 lb 9.4 oz) IBW/kg (Calculated) : 61.5 Heparin Dosing Weight: 77.8 kg   Vital Signs: Temp: 100 F (37.8 C) (10/18 1147) Temp Source: Oral (10/18 0418) BP: 112/73 (10/18 1200) Pulse Rate: 101 (10/18 1200)  Labs: Recent Labs    06/16/23 0300 06/16/23 0438 06/17/23 0116 06/17/23 0424 06/17/23 0646 06/17/23 0933 06/17/23 2159 06/18/23 0524  HGB 12.7*  --  11.6*  --   --   --   --  11.1*  HCT 38.3*  --  35.1*  --   --   --   --  34.9*  PLT 188  --  263  --   --   --   --  218  APTT  --   --   --   --   --   --  74* 63*  HEPARINUNFRC  --   --   --   --   --   --  >1.10* >1.10*  CREATININE  --    < > 2.26*   < > 2.28* 2.21*  --  1.72*   < > = values in this interval not displayed.    Estimated Creatinine Clearance: 34.5 mL/min (A) (by C-G formula based on SCr of 1.72 mg/dL (H)).   Medical History: Past Medical History:  Diagnosis Date   Alzheimer disease (HCC)    Bipolar affective disorder (HCC)    Chronic renal insufficiency    Dementia (HCC)    Dyslipidemia    HTN (hypertension)    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   gradient on TEE   Syncopal episodes      Assessment: Patient is a 77 y.o. male admitted 06/15/2023 with sepsis. PMH includes hx of DVT, dementia, CKD IIIb, dCHF, NSTEMI, and HTN. EKG found new onset Afib and patient was started on apixaban 5 mg BID. CHA2DS2-Vasc 6 (age, HTN, CHF, DM, vascular disease). No bleeding reported.  06/18/2023  aPTT 63, subtherapeutic on 1200 units/hr, increased to 1350 units/hr HL > 1.1 as expected with apixaban on board Hgb 11.1, plts 218 No bleeding noted Heparin turned off at 0742 for OR, MD ordered to resume at 1943  Goal of Therapy:  Heparin level 0.3-0.7 units/ml aPTT 66-102 seconds Monitor platelets  by anticoagulation protocol: Yes   Plan:  --reinitiate IV heparin at 1350 units/hr tonight at 1943 --Check HL and aPTT 8 hours after initiation  --Daily CBC and heparin level  --Monitor for signs and symptoms of bleeding   Karlton Lemon, PharmD Candidate  06/18/2023, 12:19 PM

## 2023-06-18 NOTE — Anesthesia Postprocedure Evaluation (Signed)
Anesthesia Post Note  Patient: Eric Zamora  Procedure(s) Performed: IRRIGATION AND DEBRIDEMENT PERIRECTAL ABSCESS     Patient location during evaluation: PACU Anesthesia Type: MAC Level of consciousness: awake and alert Pain management: pain level controlled Vital Signs Assessment: post-procedure vital signs reviewed and stable Respiratory status: spontaneous breathing, nonlabored ventilation and respiratory function stable Cardiovascular status: blood pressure returned to baseline and stable Postop Assessment: no apparent nausea or vomiting Anesthetic complications: no   No notable events documented.  Last Vitals:  Vitals:   06/18/23 1224 06/18/23 1323  BP:  131/76  Pulse: (!) 108   Resp:  (!) 24  Temp:    SpO2: 92%     Last Pain:  Vitals:   06/18/23 1231  TempSrc:   PainSc: 0-No pain                 Lowella Curb

## 2023-06-18 NOTE — Care Management Important Message (Signed)
Important Message  Patient Details  Name: Eric Zamora MRN: [id number] Date of Birth: 1946/08/12   Important Message Given:  N/A - LOS <3 / Initial given by admissions     Corey Harold 06/18/2023, 3:43 PM

## 2023-06-18 NOTE — Progress Notes (Signed)
Speech Language Pathology Treatment: Dysphagia  Patient Details Name: Eric Zamora MRN: [id number] DOB: [birth year] Today's Date: 06/18/2023 Time: 4010-2725 SLP Time Calculation (min) (ACUTE ONLY): 35 min  Assessment / Plan / Recommendation Clinical Impression  Pt seen for po readiness; he is not quite as alert as he was this am however is alert enough to accept po intake. With effort, pt noted to have mild dyspnea and wheeze. He helped to feed himself [place] Ice, single small ice chips, nectar thick juice and thin water tsp- however HR elevated to 130s and RN arrived to manage HR. After which time, SLP fed pt to decrease work load.   Excessive lingual protrusion/lingual thrusting with swallowing noted - that was not observed yesterday. Uncertain if he is attempting to piecemeal boluses or sensing pharyngeal retention without articulating such. Suspect potential pharyneal sensory deficit - Strong coughing episode noted after [place] Ice boluses and after thin water via tsp concerning for overt aspiration that may further compromise pulmonary status.    Recommend pt have tsps nectar liquids floor stock only with strict precautions and conduct MBS 06/19/2023 if surgery/MD agrees.   Ongoing concerns for aspiration, dysphagia noted and pt is a full code at this time. Hopefully pt's mentation will also continue to improve tomorrow, which may improve performance and ability to follow directions.   RN informed and swallow precaution sign posted. MD messaged with recommendations.     HPI HPI: 77 yo male adm with AMS, foul urine odor and dyspnea.  Pt is a resident of [address].  He experienced an aspiration episode yesterday with medications with applesauce and then experienced fever and respiratory difficulties requiring Bipap. Pt has h/o falls, dementia, Afib, CHF. CXR  06/17/2023 Patchy opacities throughout the lungs bilaterally, predominantly dependent, which are concerning for aspiration  pneumonia/pneumonitis given the clinical concern for aspiration. 2. Small bilateral pleural effusions (right greater than left). 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  Swallow eval ordered.      SLP Plan  MBS      Recommendations for follow up therapy are one component of a multi-disciplinary discharge planning process, led by the attending physician.  Recommendations may be updated based on patient status, additional functional criteria and insurance authorization.    Recommendations  Diet recommendations:  (clear liquids nectar via tsp) Medication Administration: Crushed with puree Supervision: Full supervision/cueing for compensatory strategies Compensations: Small sips/bites;Slow rate Postural Changes and/or Swallow Maneuvers: Seated upright 90 degrees;Upright 30-60 min after meal                  Oral care QID     Dysphagia, oropharyngeal phase (R13.12)     MBS - can limit amount of barium if surgery and hospitalist reports necessary   [name], MS [facility] SLP Acute Rehab Services Office [phone]   [name]  06/18/2023, 3:21 PM

## 2023-06-18 NOTE — Plan of Care (Signed)
CHL Tonsillectomy/Adenoidectomy, Postoperative PEDS care plan entered in error.

## 2023-06-18 NOTE — Op Note (Signed)
06/18/2023  11:39 AM  PATIENT:  Eric Zamora  77 y.o. male  PRE-OPERATIVE DIAGNOSIS:  PERIRECTAL ABSCESS  POST-OPERATIVE DIAGNOSIS:  PERIRECTAL ABSCESS  PROCEDURE:  Procedure(s): IRRIGATION AND DEBRIDEMENT PERIRECTAL ABSCESS (N/A)  SURGEON:  Surgeons and Role:    * Axel Filler, MD - Primary  ANESTHESIA:   local and IV sedation  EBL:  minimal   BLOOD ADMINISTERED:none  DRAINS: none   LOCAL MEDICATIONS USED:  BUPIVICAINE   SPECIMEN:  No Specimen  DISPOSITION OF SPECIMEN:  N/A  COUNTS:  YES  TOURNIQUET:  * No tourniquets in log *  DICTATION: .Dragon Dictation Indication for procedure: Patient is a 77 year old male came in secondary to DKA with signs of possible sepsis.  Patient on CT scan was found to have perirectal abscess.  Patient was taken back to the operating urgently for an I&D of perirectal abscess.  Findings: Patient with a large approximately 5 x 6 cm pocket of purulence that was found perirectally.  This cannot be identified intrarectally.  This was drained externally.  1/4 inch Penrose drain was sutured to the wound and packed with 1 inch iodoform gauze.  Details of procedure: After the patient was consented he was taken back to the OR and placed in the supine position with bilateral SCDs in place.  He underwent IV sedation anesthesia.  He was then placed in the right lateral decubitus position.  At this time a rectal exam began the procedure.  There is no mastectomy felt.  A anoscope was then placed.  I could not visualize any bulging of the rectal wall.  I did try to aspirate using 18-gauge needle however there was no purulence I could be found.  At this time a 15 blade was used to make a transgluteal incision.  I ended up placing a hemostat into the subcutaneous space.  This tracked deeply.  A large amount of purulence and a pocket of pus was found deep.  This was circumferentially dissected away bluntly.  At this time this was irrigated out with  sterile saline.  1/4 inch Penrose drain was then placed into the wound down to the cavity.  This was secured to the skin using 2-0 nylon x 1.  At this time the area was packed with 1 inch iodoform gauze.  Patient tolerated the procedure well was taken to the recovery in stable condition.     PLAN OF CARE: Admit to inpatient   PATIENT DISPOSITION:  PACU - hemodynamically stable.   Delay start of Pharmacological VTE agent (>24hrs) due to surgical blood loss or risk of bleeding: no

## 2023-06-18 NOTE — Consult Note (Signed)
PHARMACY - ANTICOAGULATION CONSULT NOTE  Pharmacy Consult for heparin IV Indication: atrial fibrillation  No Known Allergies  Patient Measurements: Height: 5\' 5"  (165.1 cm) Weight: 80.1 kg (176 lb 9.4 oz) IBW/kg (Calculated) : 61.5 Heparin Dosing Weight: 77.8 kg   Vital Signs: Temp: 99.2 F (37.3 C) (10/18 0418) Temp Source: Oral (10/18 0418) BP: 128/62 (10/18 0400) Pulse Rate: 126 (10/18 0418)  Labs: Recent Labs    06/15/23 1040 06/15/23 1305 06/16/23 0300 06/16/23 0438 06/17/23 0116 06/17/23 0424 06/17/23 0646 06/17/23 0933 06/17/23 2159 06/18/23 0524  HGB  --    < > 12.7*  --  11.6*  --   --   --   --  11.1*  HCT  --    < > 38.3*  --  35.1*  --   --   --   --  34.9*  PLT  --    < > 188  --  263  --   --   --   --  218  APTT 22*  --   --   --   --   --   --   --  74* 63*  LABPROT 15.8*  --   --   --   --   --   --   --   --   --   INR 1.2  --   --   --   --   --   --   --   --   --   HEPARINUNFRC  --   --   --   --   --   --   --   --  >1.10* >1.10*  CREATININE 1.99*  --   --    < > 2.26* 2.40* 2.28* 2.21*  --   --    < > = values in this interval not displayed.    Estimated Creatinine Clearance: 26.8 mL/min (A) (by C-G formula based on SCr of 2.21 mg/dL (H)).   Medical History: Past Medical History:  Diagnosis Date   Alzheimer disease (HCC)    Bipolar affective disorder (HCC)    Chronic renal insufficiency    Dementia (HCC)    Dyslipidemia    HTN (hypertension)    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   gradient on TEE   Syncopal episodes      Assessment: Patient is a 77 y.o. male admitted 06/15/2023 with sepsis. PMH includes hx of DVT, dementia, CKD IIIb, dCHF, NSTEMI, and HTN. EKG found new onset Afib and patient was started on apixaban 5 mg BID. CHA2DS2-Vasc 6 (age, HTN, CHF, DM, vascular disease). Plts 263, Hgb 11.6, INR 1.2. No bleeding reported. Today patient is NPO and pharmacy is being consulted to transition to IV heparin.  aPTT  73, subtherapeutic on 1200 units/hr HL > 1.1 as expected with apixaban on board Hgb 11.1, plts 218 No bleeding noted  Goal of Therapy:  Heparin level 0.3-0.7 units/ml aPTT 66-102 seconds Monitor platelets by anticoagulation protocol: Yes   Plan:  --increase IV heparin to 1350 units/hr  --Check  aPTT in 8 hours --Daily CBC and heparin level  --Monitor for signs and symptoms of bleeding   Arley Phenix RPh 06/18/2023, 6:08 AM

## 2023-06-18 NOTE — Evaluation (Signed)
SLP Cancellation Note  Patient Details Name: Eric Zamora MRN: [id number] DOB: May 18, 1946   Cancelled treatment:       Reason Eval/Treat Not Completed: Other (comment) (note results of CT - will hold off on eval/po until receive notification if surgery plans any procedures today. thanks.) [name], MS [facility] SLP Acute Rehab Services Office [phone]   [name] 06/18/2023, 8:01 AM

## 2023-06-18 NOTE — Assessment & Plan Note (Signed)
Aspirated with speech therapy 10/17.  Planning for MBS when able. - Consult SLP - IV fluids and NPO

## 2023-06-18 NOTE — Assessment & Plan Note (Signed)
I am uncertain of this Is perirectal abscess.  CT imaging of the liver and gallbladder are unremarkable. - Trend LFTs - [place] RUQ

## 2023-06-18 NOTE — Progress Notes (Signed)
Progress Note   Patient: Eric Zamora:[id number] DOB: [birth year] DOA: 06/15/2023     3 DOS: the patient was seen and examined on 06/18/2023 at 7:25 AM      Brief hospital course: Mr. Loach is a 77 y.o. 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, CAD, dCHF, HTN, hx DVT no longer on AC, and HLD who presented with tachypnea, fever.    Significant events: 10/15: Sent from SNF for tachpynea, found to have fever, no localizing source, admitted on antibiotics 10/17: Overnight with fever, aspiration event, new AG acidosis, transferred back to SDU for BiPAP and insulin drip 10/18: CT shows perirectal abscess, general surgery consulted    Significant studies: 10/15 CXR: no acute disease 10/15 CT head: NAICP 10/16 Echo: HOCM, normal EF, normal valves 10/17 CXR: new infiltrates c/w aspiration 10/18 CT chest abdomen and pelvis: Sick centimeter perirectal abscess   Significant microbiology data: 10/15 Blood cx x2: NGTD 10/16 COVID: Neg 10/16 RVP: Neg 10/17 Repeat blood cx: NGTD           Assessment and Plan: * Severe sepsis due to perirectal abscess(HCC) P/w fever, leukocytosis, renal failure, reported encephalopathy and hypotension >40 mmHg drop from baseline.    UTI and pneumonia ruled out.  CT chest abdomen and pelvis showed a 4 cm perirectal abscess that was not visible on clinical exam. - Continue vancomycin, Flagyl and cefepime - Consult general surgery for source control, appreciate cares - Follow blood cultures      Diabetic ketoacidosis without coma associated with type 2 diabetes mellitus (HCC) This developed overnight 10/17.  Was on insulin drip for about 24 hours, transition back to subcutaneous insulin 10/17 in the evening  -Continue glargine - Sliding scale corrections every 4   Atrial fibrillation with rapid ventricular response (HCC) New onset.  CHA2DS2-Vasc 6 (age, HTN, CHF, DM, vascular disease).   TSH normal.  Echo shows HOCM (previously known).  Normal valves. - Hold heparin perioperatively - Continue metoprolol - Stop Plavix given now on anticoagulation    Dysphagia Aspirated with speech therapy 10/17.  Planning for MBS when able. - Consult SLP - IV fluids and NPO  Acute renal failure superimposed on stage 3b chronic kidney disease (HCC) Cr 1.99 on admission from baseline ~1.3.   Creatinine back down to 1.6 - Trend Cr - Avoid nephrotoxins  CAD- Moderate LAD disease with moderate to severe ostial first diagonal disease by cath 07/2013 - not ammendable to PCI. 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  No clinical congestive heart failure, suspect this infiltrates on chest x-ray or aspiration. - Hold home furosemide/K   Essential hypertension BP normal - Continue metoprolol - Hold home furosemide  Mixed hyperlipidemia - Resume simvastatin when able to take p.o.  Bipolar disorder (manic depression) (HCC) Not on meds for this at home, no active symptoms  Transaminitis I am uncertain of this Is perirectal abscess.  CT imaging of the liver and gallbladder are unremarkable. - Trend LFTs  On antiepileptic therapy Patient not reliable historian.  Unclear reason for this med. Not listed in med list at 2022 Neuro appointment.  Maybe this is offlabel treatment for Bipolar d/o? - Resume Keppra when able to take p.o.  Uncontrolled type 2 diabetes mellitus with  hyperglycemia, without long-term current use of insulin (HCC) No prior records, not on antiglycemic medications at home.  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  No symptoms of chest pain, syncope reported.   At the moment I do not suspect CHF. - Continue metoprolol           Subjective: Patient is a poor historian, he reports no complaints.  Nursing of no concerns.  Surgery will see today.     Physical Exam: BP (!) 152/74   Pulse 96   Temp 99.1 F (37.3 C)   Resp (!) 27   Ht 5\' 5"  (1.651 m)   Wt 80.1 kg   SpO2 95%   BMI 29.39 kg/m   Adult male, lying in bed, sleepy and not very responsive or communicative Tachycardic, irregular, no murmurs, no peripheral edema Respiratory rate seems elevated, lungs diminished, no rales or wheezing Abdomen was continued distention, but no guarding, no tenderness, no rebound Rectal exam is unremarkable, I do not appreciate any fluctuance on the right or induration at all Attention somewhat diminished, affect blunted, poorly cooperative with exam, oriented to self, speech dysarthric but at baseline, face symmetric    Data Reviewed: CT of the chest abdomen and pelvis without contrast shows sick centimeter right perirectal abscess Creatinine down to 1.7 LFTs are trending up White blood cell count on CBC seems to be improving      Disposition: Status is: Inpatient         Author: Alberteen Sam, MD 06/18/2023 9:16 AM  For on call review [email or web address].

## 2023-06-18 NOTE — Progress Notes (Addendum)
Pharmacy Antibiotic Note  Eric Zamora is a 77 y.o. male admitted on 06/15/2023 with sepsis. Pharmacy has been consulted for vancomycin and cefepime dosing.  06/18/2023 10/18 CT shows perirectal abscess and pneumonia. Creatinine improvement today at 1.72 (CrCl calculated to be 34.5 ml/min). Patient currently on flagyl, cefepime, and vancomycin for broad abx coverage. Due to improved renal function, vancomycin and cefepime dosing have been recalculated.   Plan: Change cefepime dosage to 2 g IV q12h  Change vancomycin dosage to 1250 mg IV q36h (AUC 482, wt of 80.1 kg, Scr 1.72 mg/dl)  Continue Flagyl 161 mg IV q12  Monitor clinical course, renal function, cultures as available   Height: 5\' 5"  (165.1 cm) Weight: 80.1 kg (176 lb 9.4 oz) IBW/kg (Calculated) : 61.5  Temp (24hrs), Avg:99.3 F (37.4 C), Min:98.9 F (37.2 C), Max:100.2 F (37.9 C)  Recent Labs  Lab 06/15/23 1138 06/15/23 1305 06/15/23 1358 06/16/23 0300 06/16/23 0438 06/17/23 0116 06/17/23 0424 06/17/23 0646 06/17/23 0933 06/18/23 0524  WBC  --  17.1*  --  14.5*  --  22.4*  --   --   --  16.4*  CREATININE  --   --   --   --    < > 2.26* 2.40* 2.28* 2.21* 1.72*  LATICACIDVEN 2.9*  --  1.9  --   --  3.6* 5.2*  --   --   --    < > = values in this interval not displayed.    Estimated Creatinine Clearance: 34.5 mL/min (A) (by C-G formula based on SCr of 1.72 mg/dL (H)).    No Known Allergies  Antimicrobials this admission: 10/15 ceftriaxone x 1  10/15 vancomycin >>  10/15 cefepime  >>  10/15 flagyl >>  Dose adjustments this admission: Vancomycin 750 mg q24 >> 1000 mg q36 >> 1250 mg q36 Cefepime 2 q q12 >> 2 g q24 >> 2 g q12  Microbiology results: 10/15 BCx: NGTD 10/15 MRSA PCR: positive  10/16 Respiratory panel: negative  10/17 BCx: NGTD  Karlton Lemon, PharmD Candidate  06/18/2023 9:40 AM

## 2023-06-18 NOTE — Consult Note (Signed)
Reason for Consult: Perirectal abscess Referring Physician: Dr. [name] is an 77 y.o. male.  HPI: Patient is a 77 year old male, with dementia and Alzheimer's disease.  Patient is a poor historian secondary to above.  HPI is reached through the chart review.  Patient was sent to ER from SNF for tachypnea and found to have fever.  Patient was on antibiotics.  Patient found to have fever, questionable aspiration and acidosis.  Patient was started on insulin drip.  He underwent CT scan.  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  Surgery was consulted for evaluation and management.  Patient has a legal guardian.  Patient is currently on a heparin drip this morning however this been turned off. Past Medical History:  Diagnosis Date   Alzheimer disease (HCC)    Bipolar affective disorder (HCC)    Chronic renal insufficiency    Dementia (HCC)    Dyslipidemia    HTN (hypertension)    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   gradient on TEE   Syncopal episodes     Past Surgical History:  Procedure Laterality Date   LEFT HEART CATHETERIZATION WITH CORONARY ANGIOGRAM N/A 07/06/2013   Procedure: LEFT HEART CATHETERIZATION WITH CORONARY ANGIOGRAM;  Surgeon: Marykay Lex, MD;  Location: [facility] CATH LAB;  Service: Cardiovascular;  Laterality: N/A;    History reviewed. No pertinent family history.  Social History:  reports that he has never smoked. He has never used smokeless tobacco. He reports that he does not currently use drugs. He reports that he does not drink alcohol.  Allergies: No Known Allergies  Medications: I have reviewed the patient's current medications.  Results for orders placed or performed during the hospital encounter of 06/15/23 (from the past 48 hour(s))  Glucose, capillary     Status: Abnormal   Collection Time: 06/16/23 11:08 AM  Result Value Ref Range   Glucose-Capillary 294 (H) 70 - 99  mg/dL    Comment: Glucose reference range applies only to samples taken after fasting for at least 8 hours.   Comment 1 Notify RN    Comment 2 Document in Chart   SARS Coronavirus 2 by RT PCR (hospital order, performed in [facility] hospital lab) *cepheid single result test* Anterior Nasal Swab     Status: None   Collection Time: 06/16/23 12:01 PM   Specimen: Anterior Nasal Swab  Result Value Ref Range   SARS Coronavirus 2 by RT PCR NEGATIVE NEGATIVE    Comment: (NOTE) SARS-CoV-2 target nucleic acids are NOT DETECTED.  The SARS-CoV-2 RNA is generally detectable in upper and lower respiratory specimens during the acute phase of infection. The lowest concentration of SARS-CoV-2 viral copies this assay can detect is 250 copies / mL. A negative result does not preclude SARS-CoV-2 infection and should not be used as the sole basis for treatment or other patient management decisions.  A negative result may occur with improper specimen collection / handling, submission of specimen other than nasopharyngeal swab, presence of viral mutation(s) within the areas targeted by this assay, and inadequate number of viral copies (<250 copies / mL). A negative result must be combined with clinical observations, patient history, and epidemiological information.  Fact Sheet for Patients:   RoadLapTop.co.za  Fact Sheet for Healthcare Providers: http://kim-miller.com/  This test is not yet approved or  cleared by the [place] FDA and has been authorized for detection and/or diagnosis of SARS-CoV-2 by  FDA under an Emergency Use Authorization (EUA).  This EUA will remain in effect (meaning this test can be used) for the duration of the COVID-19 declaration under Section 564(b)(1) of the Act, 21 U.S.C. section 360bbb-3(b)(1), unless the authorization is terminated or revoked sooner.  Performed at [facility], [address] [place], [place] [address]   Respiratory (~20 pathogens) panel by PCR     Status: None   Collection Time: 06/16/23 12:01 PM   Specimen: Nasopharyngeal Swab; Respiratory  Result Value Ref Range   Adenovirus NOT DETECTED NOT DETECTED   Coronavirus 229E NOT DETECTED NOT DETECTED    Comment: (NOTE) The Coronavirus on the Respiratory Panel, DOES NOT test for the novel  Coronavirus (2019 nCoV)    Coronavirus HKU1 NOT DETECTED NOT DETECTED   Coronavirus NL63 NOT DETECTED NOT DETECTED   Coronavirus OC43 NOT DETECTED NOT DETECTED   Metapneumovirus NOT DETECTED NOT DETECTED   Rhinovirus / Enterovirus NOT DETECTED NOT DETECTED   Influenza A NOT DETECTED NOT DETECTED   Influenza B NOT DETECTED NOT DETECTED   Parainfluenza Virus 1 NOT DETECTED NOT DETECTED   Parainfluenza Virus 2 NOT DETECTED NOT DETECTED   Parainfluenza Virus 3 NOT DETECTED NOT DETECTED   Parainfluenza Virus 4 NOT DETECTED NOT DETECTED   Respiratory Syncytial Virus NOT DETECTED NOT DETECTED   Bordetella pertussis NOT DETECTED NOT DETECTED   Bordetella Parapertussis NOT DETECTED NOT DETECTED   Chlamydophila pneumoniae NOT DETECTED NOT DETECTED   Mycoplasma pneumoniae NOT DETECTED NOT DETECTED    Comment: Performed at [facility], [address] [place], [place] [address]  Urinalysis, w/ Reflex to Culture (Infection Suspected) -Urine, Clean Catch     Status: Abnormal   Collection Time: 06/16/23 12:21 PM  Result Value Ref Range   Specimen Source URINE, CLEAN CATCH    Color, Urine YELLOW YELLOW   APPearance CLEAR CLEAR   Specific Gravity, Urine 1.024 1.005 - 1.030   pH 5.0 5.0 - 8.0   Glucose, UA >=500 (A) NEGATIVE mg/dL   Hgb urine dipstick LARGE (A) NEGATIVE   Bilirubin Urine NEGATIVE NEGATIVE   Ketones, ur 5 (A) NEGATIVE mg/dL   Protein, ur 30 (A) NEGATIVE mg/dL   Nitrite NEGATIVE NEGATIVE   Leukocytes,Ua NEGATIVE NEGATIVE   RBC / HPF 11-20 0 - 5 RBC/hpf   WBC, UA 0-5 0 - 5 WBC/hpf    Comment:        Reflex  urine culture not performed if WBC <=10, OR if Squamous epithelial cells >5. If Squamous epithelial cells >5 suggest recollection.    Bacteria, UA NONE SEEN NONE SEEN   Squamous Epithelial / HPF 0-5 0 - 5 /HPF    Comment: Performed at [facility], [address], [place], [place] [address]  Glucose, capillary     Status: Abnormal   Collection Time: 06/16/23  5:06 PM  Result Value Ref Range   Glucose-Capillary 118 (H) 70 - 99 mg/dL    Comment: Glucose reference range applies only to samples taken after fasting for at least 8 hours.  Glucose, capillary     Status: Abnormal   Collection Time: 06/16/23  8:51 PM  Result Value Ref Range   Glucose-Capillary 179 (H) 70 - 99 mg/dL    Comment: Glucose reference range applies only to samples taken after fasting for at least 8 hours.  Blood gas, arterial     Status: Abnormal   Collection Time: 06/17/23 12:39 AM  Result Value  Ref Range   O2 Content 2.0 L/min   Delivery systems NASAL CANNULA    pH, Arterial 7.32 (L) 7.35 - 7.45   pCO2 arterial 25 (L) 32 - 48 mmHg   pO2, Arterial 76 (L) 83 - 108 mmHg   Bicarbonate 12.4 (L) 20.0 - 28.0 mmol/L   Acid-base deficit 10.9 (H) 0.0 - 2.0 mmol/L   O2 Saturation 92.8 %   Patient temperature 39.5    Collection site LEFT RADIAL    Allens test (pass/fail) PASS PASS    Comment: Performed at [facility], [address], [place], [place] [address]  CBC     Status: Abnormal   Collection Time: 06/17/23  1:16 AM  Result Value Ref Range   WBC 22.4 (H) 4.0 - 10.5 K/uL   RBC 3.95 (L) 4.22 - 5.81 MIL/uL   Hemoglobin 11.6 (L) 13.0 - 17.0 g/dL   HCT 60.4 (L) 54.0 - 98.1 %   MCV 88.9 80.0 - 100.0 fL   MCH 29.4 26.0 - 34.0 pg   MCHC 33.0 30.0 - 36.0 g/dL   RDW 19.1 47.8 - 29.5 %   Platelets 263 150 - 400 K/uL   nRBC 0.0 0.0 - 0.2 %    Comment: Performed at [facility], [address] [place], [place] [address]  Comprehensive metabolic panel      Status: Abnormal   Collection Time: 06/17/23  1:16 AM  Result Value Ref Range   Sodium 130 (L) 135 - 145 mmol/L   Potassium 5.8 (H) 3.5 - 5.1 mmol/L   Chloride 100 98 - 111 mmol/L   CO2 13 (L) 22 - 32 mmol/L   Glucose, Bld 231 (H) 70 - 99 mg/dL    Comment: Glucose reference range applies only to samples taken after fasting for at least 8 hours.   BUN 40 (H) 8 - 23 mg/dL   Creatinine, Ser 8.65 (H) 0.61 - 1.24 mg/dL   Calcium 8.0 (L) 8.9 - 10.3 mg/dL   Total Protein 7.6 6.5 - 8.1 g/dL   Albumin 3.0 (L) 3.5 - 5.0 g/dL   AST 784 (H) 15 - 41 U/L   ALT 79 (H) 0 - 44 U/L   Alkaline Phosphatase 49 38 - 126 U/L   Total Bilirubin 1.0 0.3 - 1.2 mg/dL   GFR, Estimated 29 (L) >60 mL/min    Comment: (NOTE) Calculated using the CKD-EPI Creatinine Equation (2021)    Anion gap 17 (H) 5 - 15    Comment: Performed at [facility], [address] [place], [place] [address]  Lactic acid, plasma     Status: Abnormal   Collection Time: 06/17/23  1:16 AM  Result Value Ref Range   Lactic Acid, Venous 3.6 (HH) 0.5 - 1.9 mmol/L    Comment: CRITICAL RESULT CALLED TO, READ BACK BY AND VERIFIED WITH LYON,J RN @ 0155 06/17/23 BY CHILDRESS,E Performed at [facility], [address], [place], [place] [address]   Beta-hydroxybutyric acid     Status: Abnormal   Collection Time: 06/17/23  1:16 AM  Result Value Ref Range   Beta-Hydroxybutyric Acid 3.47 (H) 0.05 - 0.27 mmol/L    Comment: Performed at [facility], [address], [place], [place] [address]  Culture, blood (Routine X 2) w Reflex to ID Panel     Status: None (Preliminary result)   Collection Time: 06/17/23  4:24 AM   Specimen: BLOOD  Result Value Ref Range  Specimen Description      BLOOD LEFT ANTECUBITAL Performed at [facility], [address], [place], [place] [address]    Special Requests      BOTTLES DRAWN AEROBIC ONLY Blood Culture adequate volume Performed  at [facility], [address], [place], [place] [address]    Culture      NO GROWTH 1 DAY Performed at [facility], [address] [place], [place] [address]    Report Status PENDING   Culture, blood (Routine X 2) w Reflex to ID Panel     Status: None (Preliminary result)   Collection Time: 06/17/23  4:24 AM   Specimen: BLOOD LEFT HAND  Result Value Ref Range   Specimen Description      BLOOD LEFT HAND Performed at [facility], [address] [place], [place] [address]    Special Requests      BOTTLES DRAWN AEROBIC ONLY Blood Culture adequate volume Performed at [facility], [address] [place], [place] [address]    Culture      NO GROWTH 1 DAY Performed at [facility], [address] [place], [place] [address]    Report Status PENDING   Lactic acid, plasma     Status: Abnormal   Collection Time: 06/17/23  4:24 AM  Result Value Ref Range   Lactic Acid, Venous 5.2 (HH) 0.5 - 1.9 mmol/L    Comment: CRITICAL VALUE NOTED. VALUE IS CONSISTENT WITH PREVIOUSLY REPORTED/CALLED VALUE Performed at [facility], [address] [place], [place] [address]   Basic metabolic panel     Status: Abnormal   Collection Time: 06/17/23  4:24 AM  Result Value Ref Range   Sodium 135 135 - 145 mmol/L   Potassium 5.8 (H) 3.5 - 5.1 mmol/L   Chloride 104 98 - 111 mmol/L   CO2 11 (L) 22 - 32 mmol/L   Glucose, Bld 264 (H) 70 - 99 mg/dL    Comment: Glucose reference range applies only to samples taken after fasting for at least 8 hours.   BUN 43 (H) 8 - 23 mg/dL   Creatinine, Ser 9.62 (H) 0.61 - 1.24 mg/dL   Calcium 8.1 (L) 8.9 - 10.3 mg/dL   GFR, Estimated 27 (L) >60 mL/min    Comment: (NOTE) Calculated using the CKD-EPI Creatinine Equation (2021)    Anion gap 20 (H) 5 - 15    Comment: Performed at [facility], [address] [place], [place] [address]  Glucose, capillary      Status: Abnormal   Collection Time: 06/17/23  4:54 AM  Result Value Ref Range   Glucose-Capillary 254 (H) 70 - 99 mg/dL    Comment: Glucose reference range applies only to samples taken after fasting for at least 8 hours.  Glucose, capillary     Status: Abnormal   Collection Time: 06/17/23  6:15 AM  Result Value Ref Range   Glucose-Capillary 233 (H) 70 - 99 mg/dL    Comment: Glucose reference range applies only to samples taken after fasting for at least 8 hours.  Basic metabolic panel     Status: Abnormal   Collection Time: 06/17/23  6:46 AM  Result Value Ref Range   Sodium 135 135 - 145 mmol/L   Potassium 4.9 3.5 - 5.1 mmol/L   Chloride 105 98 - 111 mmol/L   CO2 13 (L) 22 - 32 mmol/L   Glucose, Bld 255 (  H) 70 - 99 mg/dL    Comment: Glucose reference range applies only to samples taken after fasting for at least 8 hours.   BUN 43 (H) 8 - 23 mg/dL   Creatinine, Ser 2.13 (H) 0.61 - 1.24 mg/dL   Calcium 8.0 (L) 8.9 - 10.3 mg/dL   GFR, Estimated 29 (L) >60 mL/min    Comment: (NOTE) Calculated using the CKD-EPI Creatinine Equation (2021)    Anion gap 17 (H) 5 - 15    Comment: Performed at [facility], [address], [place], [place] [address]  Blood gas, venous     Status: Abnormal   Collection Time: 06/17/23  6:46 AM  Result Value Ref Range   pH, Ven 7.38 7.25 - 7.43   pCO2, Ven 22 (L) 44 - 60 mmHg   pO2, Ven 87 (H) 32 - 45 mmHg   Bicarbonate 13.3 (L) 20.0 - 28.0 mmol/L   Acid-base deficit 9.5 (H) 0.0 - 2.0 mmol/L   O2 Saturation 98.2 %   Patient temperature 37.5     Comment: Performed at [facility], [address], [place], [place] [address]  Glucose, capillary     Status: Abnormal   Collection Time: 06/17/23  7:23 AM  Result Value Ref Range   Glucose-Capillary 237 (H) 70 - 99 mg/dL    Comment: Glucose reference range applies only to samples taken after fasting for at least 8 hours.   Comment 1 Notify RN    Comment 2 Document in  Chart   Glucose, capillary     Status: Abnormal   Collection Time: 06/17/23  8:19 AM  Result Value Ref Range   Glucose-Capillary 226 (H) 70 - 99 mg/dL    Comment: Glucose reference range applies only to samples taken after fasting for at least 8 hours.   Comment 1 Notify RN    Comment 2 Document in Chart   Glucose, capillary     Status: Abnormal   Collection Time: 06/17/23  9:21 AM  Result Value Ref Range   Glucose-Capillary 208 (H) 70 - 99 mg/dL    Comment: Glucose reference range applies only to samples taken after fasting for at least 8 hours.  Basic metabolic panel     Status: Abnormal   Collection Time: 06/17/23  9:33 AM  Result Value Ref Range   Sodium 133 (L) 135 - 145 mmol/L   Potassium 4.3 3.5 - 5.1 mmol/L   Chloride 104 98 - 111 mmol/L   CO2 18 (L) 22 - 32 mmol/L   Glucose, Bld 241 (H) 70 - 99 mg/dL    Comment: Glucose reference range applies only to samples taken after fasting for at least 8 hours.   BUN 46 (H) 8 - 23 mg/dL   Creatinine, Ser 2.95 (H) 0.61 - 1.24 mg/dL   Calcium 7.9 (L) 8.9 - 10.3 mg/dL   GFR, Estimated 30 (L) >60 mL/min    Comment: (NOTE) Calculated using the CKD-EPI Creatinine Equation (2021)    Anion gap 11 5 - 15    Comment: Performed at [facility], [address], [place], [place] [address]  Beta-hydroxybutyric acid     Status: Abnormal   Collection Time: 06/17/23  9:33 AM  Result Value Ref Range   Beta-Hydroxybutyric Acid 0.32 (H) 0.05 - 0.27 mmol/L    Comment: Performed at [facility], [address] [place], [place] [address]  Glucose, capillary     Status: Abnormal   Collection  Time: 06/17/23 10:18 AM  Result Value Ref Range   Glucose-Capillary 212 (H) 70 - 99 mg/dL    Comment: Glucose reference range applies only to samples taken after fasting for at least 8 hours.  Glucose, capillary     Status: Abnormal   Collection Time: 06/17/23 11:18 AM  Result Value Ref Range   Glucose-Capillary 197 (H) 70  - 99 mg/dL    Comment: Glucose reference range applies only to samples taken after fasting for at least 8 hours.  Glucose, capillary     Status: Abnormal   Collection Time: 06/17/23 12:23 PM  Result Value Ref Range   Glucose-Capillary 166 (H) 70 - 99 mg/dL    Comment: Glucose reference range applies only to samples taken after fasting for at least 8 hours.  Glucose, capillary     Status: Abnormal   Collection Time: 06/17/23  1:25 PM  Result Value Ref Range   Glucose-Capillary 196 (H) 70 - 99 mg/dL    Comment: Glucose reference range applies only to samples taken after fasting for at least 8 hours.   Comment 1 Notify RN    Comment 2 Document in Chart   Glucose, capillary     Status: Abnormal   Collection Time: 06/17/23  2:38 PM  Result Value Ref Range   Glucose-Capillary 159 (H) 70 - 99 mg/dL    Comment: Glucose reference range applies only to samples taken after fasting for at least 8 hours.  Glucose, capillary     Status: Abnormal   Collection Time: 06/17/23  3:51 PM  Result Value Ref Range   Glucose-Capillary 143 (H) 70 - 99 mg/dL    Comment: Glucose reference range applies only to samples taken after fasting for at least 8 hours.   Comment 1 Notify RN    Comment 2 Document in Chart   Glucose, capillary     Status: Abnormal   Collection Time: 06/17/23  4:55 PM  Result Value Ref Range   Glucose-Capillary 160 (H) 70 - 99 mg/dL    Comment: Glucose reference range applies only to samples taken after fasting for at least 8 hours.   Comment 1 Notify RN    Comment 2 Document in Chart   Glucose, capillary     Status: Abnormal   Collection Time: 06/17/23  7:22 PM  Result Value Ref Range   Glucose-Capillary 173 (H) 70 - 99 mg/dL    Comment: Glucose reference range applies only to samples taken after fasting for at least 8 hours.  APTT     Status: Abnormal   Collection Time: 06/17/23  9:59 PM  Result Value Ref Range   aPTT 74 (H) 24 - 36 seconds    Comment:        IF BASELINE aPTT  IS ELEVATED, SUGGEST PATIENT RISK ASSESSMENT BE USED TO DETERMINE APPROPRIATE ANTICOAGULANT THERAPY. Performed at [facility], [address], [place], [place] [address]   Heparin level (unfractionated)     Status: Abnormal   Collection Time: 06/17/23  9:59 PM  Result Value Ref Range   Heparin Unfractionated >1.10 (H) 0.30 - 0.70 IU/mL    Comment: (NOTE) The clinical reportable range upper limit is being lowered to >1.10 to align with the FDA approved guidance for the current laboratory assay.  If heparin results are below expected values, and patient dosage has  been confirmed, suggest follow up testing of antithrombin III levels. Performed at [facility], [address], [place], [place] [address]  Glucose, capillary     Status: Abnormal   Collection Time: 06/17/23 11:11 PM  Result Value Ref Range   Glucose-Capillary 132 (H) 70 - 99 mg/dL    Comment: Glucose reference range applies only to samples taken after fasting for at least 8 hours.  Glucose, capillary     Status: Abnormal   Collection Time: 06/18/23  4:15 AM  Result Value Ref Range   Glucose-Capillary 129 (H) 70 - 99 mg/dL    Comment: Glucose reference range applies only to samples taken after fasting for at least 8 hours.  CBC     Status: Abnormal   Collection Time: 06/18/23  5:24 AM  Result Value Ref Range   WBC 16.4 (H) 4.0 - 10.5 K/uL   RBC 3.83 (L) 4.22 - 5.81 MIL/uL   Hemoglobin 11.1 (L) 13.0 - 17.0 g/dL   HCT 59.5 (L) 63.8 - 75.6 %   MCV 91.1 80.0 - 100.0 fL   MCH 29.0 26.0 - 34.0 pg   MCHC 31.8 30.0 - 36.0 g/dL   RDW 43.3 29.5 - 18.8 %   Platelets 218 150 - 400 K/uL   nRBC 0.0 0.0 - 0.2 %    Comment: Performed at [facility], [address], [place], [place] [address]  Comprehensive metabolic panel     Status: Abnormal   Collection Time: 06/18/23  5:24 AM  Result Value Ref Range   Sodium 138 135 - 145 mmol/L   Potassium 4.2 3.5 - 5.1 mmol/L    Chloride 110 98 - 111 mmol/L   CO2 18 (L) 22 - 32 mmol/L   Glucose, Bld 85 70 - 99 mg/dL    Comment: Glucose reference range applies only to samples taken after fasting for at least 8 hours.   BUN 37 (H) 8 - 23 mg/dL   Creatinine, Ser 6.30 (H) 0.61 - 1.24 mg/dL   Calcium 7.9 (L) 8.9 - 10.3 mg/dL   Total Protein 6.6 6.5 - 8.1 g/dL   Albumin 2.6 (L) 3.5 - 5.0 g/dL   AST 160 (H) 15 - 41 U/L   ALT 186 (H) 0 - 44 U/L   Alkaline Phosphatase 44 38 - 126 U/L   Total Bilirubin 0.8 0.3 - 1.2 mg/dL   GFR, Estimated 40 (L) >60 mL/min    Comment: (NOTE) Calculated using the CKD-EPI Creatinine Equation (2021)    Anion gap 10 5 - 15    Comment: Performed at [facility], [address], [place], [place] [address]  APTT     Status: Abnormal   Collection Time: 06/18/23  5:24 AM  Result Value Ref Range   aPTT 63 (H) 24 - 36 seconds    Comment:        IF BASELINE aPTT IS ELEVATED, SUGGEST PATIENT RISK ASSESSMENT BE USED TO DETERMINE APPROPRIATE ANTICOAGULANT THERAPY. Performed at [facility], [address], [place], [place] [address]   Heparin level (unfractionated)     Status: Abnormal   Collection Time: 06/18/23  5:24 AM  Result Value Ref Range   Heparin Unfractionated >1.10 (H) 0.30 - 0.70 IU/mL    Comment: (NOTE) The clinical reportable range upper limit is being lowered to >1.10 to align with the FDA approved guidance for the current laboratory assay.  If heparin results are below expected values, and patient dosage has  been confirmed, suggest follow up testing of antithrombin III levels. Performed at [facility], [address] [place], [place]  [address]   Glucose, capillary     Status: None   Collection Time: 06/18/23  7:56 AM  Result Value Ref Range   Glucose-Capillary 79 70 - 99 mg/dL    Comment: Glucose reference range applies only to samples taken after fasting for at least 8 hours.   Comment 1 Notify RN     Comment 2 Document in Chart     CT CHEST ABDOMEN PELVIS WO CONTRAST  Result Date: 06/17/2023 CLINICAL DATA:  Sepsis.  Patient aspirated applesauce today. EXAM: CT CHEST, ABDOMEN AND PELVIS WITHOUT CONTRAST TECHNIQUE: Multidetector CT imaging of the chest, abdomen and pelvis was performed following the standard protocol without IV contrast. RADIATION DOSE REDUCTION: This exam was performed according to the departmental dose-optimization program which includes automated exposure control, adjustment of the mA and/or kV according to patient size and/or use of iterative reconstruction technique. COMPARISON:  CT of the abdomen and pelvis without contrast 07/02/2013 FINDINGS: CT CHEST FINDINGS Cardiovascular: The heart size is normal. Coronary artery calcifications are present. Calcifications are present the aortic valve and aortic arch. No aneurysm or stenosis is present. The pulmonary arteries are of normal size. Mediastinum/Nodes: No enlarged mediastinal, hilar, or axillary lymph nodes. Thyroid gland, trachea, and esophagus demonstrate no significant findings. Lungs/Pleura: Patchy ground-glass attenuation is present the right upper lobe posteriorly. Patchy airspace consolidation is present in the lower lobes bilaterally. Is some thickening of the lower airways. Moderate sized bilateral effusions are present. Musculoskeletal: No chest wall mass or suspicious bone lesions identified. CT ABDOMEN PELVIS FINDINGS Hepatobiliary: No focal liver abnormality is seen. No gallstones, gallbladder wall thickening, or biliary dilatation. Pancreas: Unremarkable. No pancreatic ductal dilatation or surrounding inflammatory changes. Spleen: Normal in size without focal abnormality. Adrenals/Urinary Tract: A 2.4 cm water density lesion posteriorly in the left kidney presents a simple cyst. No other renal lesions are present. No stone or obstruction is present. The ureters are within normal limits bilaterally. The urinary bladder is  mildly distended, measuring to 12.4 cm. Irregular nodularity is present the bladder base associated with the prostate gland. Stomach/Bowel: The stomach and duodenum are within normal limits. The small bowel is unremarkable. The terminal ileum is within normal limits. The appendix is visualized and normal. The ascending and transverse colon are within normal limits. The descending and proximal sigmoid colon are. The distal sigmoid colon and proximal rectum are dilated to 6 cm in transverse diameter. A right perirectal collection of fluid and gas measures 6.2 x 3.2 x 3.3 cm. Vascular/Lymphatic: Atherosclerotic calcifications are present in the aorta and branch vessels. No significant adenopathy is present. Reproductive: Status post hysterectomy. No adnexal masses. Other: No abdominal wall hernia or abnormality. No abdominopelvic ascites. Musculoskeletal: Inferior endplate fracture at L3 demonstrates 30% loss of height anteriorly without focal kyphosis. No focal osseous lesions are present. Bony pelvis is within normal limits. The hips are located and normal bilaterally. IMPRESSION: 1. 6.2 x 3.2 x 3.3 cm right perirectal collection of fluid and gas consistent with a perirectal abscess and fistula. 2. The distal sigmoid colon and proximal rectum are dilated to 6 cm in transverse diameter just proximal to the suspected fistula. No significant proximal obstruction is present. 3. Patchy ground-glass attenuation in the right upper lobe posteriorly is concerning for infection. 4. Patchy airspace consolidation in the lower lobes bilaterally compatible with pneumonia. 5. Moderate sized bilateral effusions. 6. Coronary artery disease. 7. Inferior endplate fracture at L3 demonstrates 30% loss of height anteriorly without focal kyphosis. This is age indeterminate. 8.  Irregular nodularity the bladder base associated with the prostate gland. This may be related to the prostate gland. Neoplasm is not excluded. Recommend correlation  with PSA. 9. 2.4 cm simple cyst posteriorly in the left kidney. No follow-up imaging is recommended. JACR 2018 Feb; 264-273, Management of the Incidental Renal Mass on CT, [employer] 2021; 814-848, Bosniak Classification of Cystic Renal Masses, Version 2019. 10.  Aortic Atherosclerosis (ICD10-I70.0). Electronically Signed   By: Marin Roberts M.D.   On: 06/17/2023 18:33   DG CHEST PORT 1 VIEW  Result Date: 06/17/2023 CLINICAL DATA:  77 year old male with history of aspiration. EXAM: PORTABLE CHEST 1 VIEW COMPARISON:  Chest x-ray 06/15/2023. FINDINGS: Lung volumes are very low. Patchy areas of interstitial prominence an ill-defined opacities are noted throughout the right mid to lower lung in the left lung base. Small right and trace left pleural effusion. No pneumothorax. No evidence of pulmonary edema. Heart size is normal. Upper mediastinal contours are distorted by patient's rotation to the right. IMPRESSION: 1. Patchy opacities throughout the lungs bilaterally, predominantly dependent, which are concerning for aspiration pneumonia/pneumonitis given the clinical concern for aspiration. 2. Small bilateral pleural effusions (right greater than left). Electronically Signed   By: Trudie Reed M.D.   On: 06/17/2023 05:08   ECHOCARDIOGRAM COMPLETE  Result Date: 06/16/2023    ECHOCARDIOGRAM REPORT   Patient Name:   Eric Zamora Date of Exam: 06/16/2023 Medical Rec #:  [id number]        Height:       65.0 in Accession #:    [id number]       Weight:       176.6 lb Date of Birth:  04-06-1946        BSA:          1.876 m Patient Age:    85 years         BP:           126/68 mmHg Patient Gender: M                HR:           59 bpm. Exam Location:  Inpatient Procedure: 2D Echo, Color Doppler and Cardiac Doppler Indications:    Atrial fibrillation  History:        Patient has prior history of Echocardiogram examinations, most                 recent 07/03/2013. Cardiomyopathy, Previous Myocardial                  Infarction, Signs/Symptoms:Alzheimer's; Risk                 Factors:Hypertension and Dyslipidemia. Chronic renal                 insufficiency.  Sonographer:    Milda Smart Referring Phys: [id number] MIR M [facility]  Sonographer Comments: Patient is obese. IMPRESSIONS  1. Severe asymmetric hypertrophy of the basal septum up to 16 mm. There is mitral valve SAM with LVOT obstruction up to 41 mmHG. There is moderate, centrally/posterior directed mitral valve regurgitation. Findings are concerning for hypertrophic cardiomyopathy. Left ventricular ejection fraction, by estimation, is 60 to 65%. The left ventricle has normal function. Left ventricular endocardial border not optimally defined to evaluate regional wall motion. There is severe asymmetric left ventricular hypertrophy of the basal-septal segment. Left ventricular diastolic function could not be evaluated.  2. Right ventricular systolic function is normal. The right ventricular size is  normal. There is normal pulmonary artery systolic pressure. The estimated right ventricular systolic pressure is 23.1 mmHg.  3. Left atrial size was mildly dilated.  4. The mitral valve is grossly normal. Moderate mitral valve regurgitation. No evidence of mitral stenosis.  5. The aortic valve is tricuspid. There is mild calcification of the aortic valve. Aortic valve regurgitation is not visualized. Aortic valve sclerosis is present, with no evidence of aortic valve stenosis.  6. The inferior vena cava is normal in size with greater than 50% respiratory variability, suggesting right atrial pressure of 3 mmHg. FINDINGS  Left Ventricle: Severe asymmetric hypertrophy of the basal septum up to 16 mm. There is mitral valve SAM with LVOT obstruction up to 41 mmHG. There is moderate, centrally/posterior directed mitral valve regurgitation. Findings are concerning for hypertrophic cardiomyopathy. Left ventricular ejection fraction, by estimation, is 60 to 65%. The left  ventricle has normal function. Left ventricular endocardial border not optimally defined to evaluate regional wall motion. The left ventricular internal cavity size was normal in size. There is severe asymmetric left ventricular hypertrophy of the basal-septal segment. Left ventricular diastolic function could not be evaluated due to atrial fibrillation. Left ventricular diastolic function could  not be evaluated. Right Ventricle: The right ventricular size is normal. No increase in right ventricular wall thickness. Right ventricular systolic function is normal. There is normal pulmonary artery systolic pressure. The tricuspid regurgitant velocity is 2.24 m/s, and  with an assumed right atrial pressure of 3 mmHg, the estimated right ventricular systolic pressure is 23.1 mmHg. Left Atrium: Left atrial size was mildly dilated. Right Atrium: Right atrial size was normal in size. Pericardium: There is no evidence of pericardial effusion. Mitral Valve: The mitral valve is grossly normal. Mild mitral annular calcification. Moderate mitral valve regurgitation. No evidence of mitral valve stenosis. MV peak gradient, 11.3 mmHg. The mean mitral valve gradient is 3.0 mmHg. Tricuspid Valve: The tricuspid valve is grossly normal. Tricuspid valve regurgitation is mild . No evidence of tricuspid stenosis. Aortic Valve: The aortic valve is tricuspid. There is mild calcification of the aortic valve. Aortic valve regurgitation is not visualized. Aortic valve sclerosis is present, with no evidence of aortic valve stenosis. Pulmonic Valve: The pulmonic valve was grossly normal. Pulmonic valve regurgitation is mild. No evidence of pulmonic stenosis. Aorta: The aortic root and ascending aorta are structurally normal, with no evidence of dilitation. Venous: The inferior vena cava is normal in size with greater than 50% respiratory variability, suggesting right atrial pressure of 3 mmHg. IAS/Shunts: The atrial septum is grossly normal.   LEFT VENTRICLE PLAX 2D LVIDd:         3.50 cm     Diastology LVIDs:         2.70 cm     LV e' medial:    4.95 cm/s LV PW:         1.30 cm     LV E/e' medial:  34.5 LV IVS:        1.60 cm     LV e' lateral:   9.08 cm/s LVOT diam:     2.00 cm     LV E/e' lateral: 18.8 LVOT Area:     3.14 cm  LV Volumes (MOD) LV vol d, MOD A4C: 49.0 ml LV vol s, MOD A4C: 17.3 ml LV SV MOD A4C:     49.0 ml RIGHT VENTRICLE             IVC RV S prime:  14.80 cm/s  IVC diam: 1.80 cm LEFT ATRIUM             Index        RIGHT ATRIUM           Index LA diam:        4.50 cm 2.40 cm/m   RA Area:     14.10 cm LA Vol (A2C):   73.8 ml 39.34 ml/m  RA Volume:   34.30 ml  18.28 ml/m LA Vol (A4C):   73.4 ml 39.12 ml/m LA Biplane Vol: 74.0 ml 39.44 ml/m   AORTA Ao Root diam: 3.20 cm Ao Asc diam:  3.40 cm MITRAL VALVE                TRICUSPID VALVE MV Area (PHT): 4.65 cm     TR Peak grad:   20.1 mmHg MV Peak grad:  11.3 mmHg    TR Vmax:        224.00 cm/s MV Mean grad:  3.0 mmHg MV Vmax:       1.68 m/s     SHUNTS MV Vmean:      77.0 cm/s    Systemic Diam: 2.00 cm MV Decel Time: 163 msec MR Peak grad: 76.2 mmHg MR Mean grad: 58.0 mmHg MR Vmax:      436.50 cm/s MR Vmean:     362.0 cm/s MV E velocity: 171.00 cm/s Lennie Odor MD Electronically signed by Lennie Odor MD Signature Date/Time: 06/16/2023/3:32:39 PM    Final     Review of Systems  Constitutional:  Negative for chills and fever.  HENT:  Negative for ear discharge, hearing loss and sore throat.   Eyes:  Negative for discharge.  Respiratory:  Negative for cough and shortness of breath.   Cardiovascular:  Negative for chest pain and leg swelling.  Gastrointestinal:  Negative for abdominal pain, constipation, diarrhea, nausea and vomiting.  Musculoskeletal:  Negative for myalgias and neck pain.  Skin:  Negative for rash.  Allergic/Immunologic: Negative for environmental allergies.  Neurological:  Negative for dizziness and seizures.  Hematological:  Does not bruise/bleed  easily.  Psychiatric/Behavioral:  Negative for suicidal ideas.   All other systems reviewed and are negative.  Blood pressure (!) 152/74, pulse 96, temperature 99.1 F (37.3 C), resp. rate (!) 27, height 5\' 5"  (1.651 m), weight 80.1 kg, SpO2 95%. Physical Exam Constitutional:      Appearance: He is well-developed.     Comments: Conversant No acute distress  HENT:     Head: Normocephalic and atraumatic.  Eyes:     General: Lids are normal. No scleral icterus.    Pupils: Pupils are equal, round, and reactive to light.     Comments: Pupils are equal round and reactive No lid lag Moist conjunctiva  Neck:     Thyroid: No thyromegaly.     Trachea: No tracheal tenderness.     Comments: No cervical lymphadenopathy Cardiovascular:     Rate and Rhythm: Normal rate and regular rhythm.     Heart sounds: No murmur heard. Pulmonary:     Effort: Pulmonary effort is normal.     Breath sounds: Normal breath sounds. No wheezing or rales.  Abdominal:     Tenderness: There is no abdominal tenderness.     Hernia: No hernia is present.  Genitourinary:      Comments: Area of induration Musculoskeletal:     Cervical back: Normal range of motion and neck supple.  Skin:    General:  Skin is warm.     Findings: No rash.     Nails: There is no clubbing.     Comments: Normal skin turgor  Neurological:     Mental Status: He is alert.     Comments: Normal gait and station  Psychiatric:        Mood and Affect: Mood normal.        Thought Content: Thought content normal.        Judgment: Judgment normal.     Comments: Appropriate affect     Assessment/Plan: 77 year old male with a perirectal abscess and likely fistula Dementia Alzheimer's disease CAD Bipolar CHF A-fib Diabetes  1.  At this time this appears to be source of infection that was concern.  Patient with perirectal abscess.  This appears to be fairly deep.  Would like to proceed to the operating room for exam under anesthesia  and likely internal drainage versus external drainage.  This likely secondary to a fistula.  I did call and discussed with the patient's legal guardian the findings.  Will have him consented for or later today.  Heparin drip is been turned off.  I did discuss with him the risk benefits the procedure to include but not limited to: Infection, bleeding, damage to surrounding structures, possible need for the surgery.  Patiently guardian voiced understanding wished to proceed.   Axel Filler 06/18/2023, 8:45 AM

## 2023-06-19 ENCOUNTER — Inpatient Hospital Stay (HOSPITAL_COMMUNITY): Payer: Medicare (Managed Care)

## 2023-06-19 ENCOUNTER — Encounter (HOSPITAL_COMMUNITY): Payer: Self-pay | Admitting: General Surgery

## 2023-06-19 DIAGNOSIS — I4891 Unspecified atrial fibrillation: Secondary | ICD-10-CM | POA: Diagnosis not present

## 2023-06-19 DIAGNOSIS — N179 Acute kidney failure, unspecified: Secondary | ICD-10-CM | POA: Diagnosis not present

## 2023-06-19 DIAGNOSIS — I2510 Atherosclerotic heart disease of native coronary artery without angina pectoris: Secondary | ICD-10-CM | POA: Diagnosis not present

## 2023-06-19 DIAGNOSIS — A419 Sepsis, unspecified organism: Secondary | ICD-10-CM | POA: Diagnosis not present

## 2023-06-19 LAB — CBC
HCT: 34.6 % — ABNORMAL LOW (ref 39.0–52.0)
Hemoglobin: 10.8 g/dL — ABNORMAL LOW (ref 13.0–17.0)
MCH: 29 pg (ref 26.0–34.0)
MCHC: 31.2 g/dL (ref 30.0–36.0)
MCV: 92.8 fL (ref 80.0–100.0)
Platelets: 231 10*3/uL (ref 150–400)
RBC: 3.73 MIL/uL — ABNORMAL LOW (ref 4.22–5.81)
RDW: 13.7 % (ref 11.5–15.5)
WBC: 12.9 10*3/uL — ABNORMAL HIGH (ref 4.0–10.5)
nRBC: 0 % (ref 0.0–0.2)

## 2023-06-19 LAB — COMPREHENSIVE METABOLIC PANEL
ALT: 218 U/L — ABNORMAL HIGH (ref 0–44)
AST: 258 U/L — ABNORMAL HIGH (ref 15–41)
Albumin: 2.3 g/dL — ABNORMAL LOW (ref 3.5–5.0)
Alkaline Phosphatase: 36 U/L — ABNORMAL LOW (ref 38–126)
Anion gap: 8 (ref 5–15)
BUN: 30 mg/dL — ABNORMAL HIGH (ref 8–23)
CO2: 18 mmol/L — ABNORMAL LOW (ref 22–32)
Calcium: 7.6 mg/dL — ABNORMAL LOW (ref 8.9–10.3)
Chloride: 116 mmol/L — ABNORMAL HIGH (ref 98–111)
Creatinine, Ser: 1.52 mg/dL — ABNORMAL HIGH (ref 0.61–1.24)
GFR, Estimated: 47 mL/min — ABNORMAL LOW (ref >60)
Glucose, Bld: 111 mg/dL — ABNORMAL HIGH (ref 70–99)
Potassium: 3.9 mmol/L (ref 3.5–5.1)
Sodium: 142 mmol/L (ref 135–145)
Total Bilirubin: 0.7 mg/dL (ref 0.3–1.2)
Total Protein: 6.1 g/dL — ABNORMAL LOW (ref 6.5–8.1)

## 2023-06-19 LAB — GLUCOSE, CAPILLARY
Glucose-Capillary: 131 mg/dL — ABNORMAL HIGH (ref 70–99)
Glucose-Capillary: 133 mg/dL — ABNORMAL HIGH (ref 70–99)
Glucose-Capillary: 160 mg/dL — ABNORMAL HIGH (ref 70–99)
Glucose-Capillary: 163 mg/dL — ABNORMAL HIGH (ref 70–99)
Glucose-Capillary: 98 mg/dL (ref 70–99)
Glucose-Capillary: 98 mg/dL (ref 70–99)

## 2023-06-19 LAB — APTT: aPTT: 85 s — ABNORMAL HIGH (ref 24–36)

## 2023-06-19 LAB — HEPARIN LEVEL (UNFRACTIONATED): Heparin Unfractionated: 0.78 [IU]/mL — ABNORMAL HIGH (ref 0.30–0.70)

## 2023-06-19 MED ORDER — METOPROLOL TARTRATE 25 MG PO TABS
25.0000 mg | ORAL_TABLET | Freq: Two times a day (BID) | ORAL | Status: DC
  Administered 2023-06-19 – 2023-06-21 (×5): 25 mg via ORAL
  Filled 2023-06-19 (×5): qty 1

## 2023-06-19 MED ORDER — APIXABAN 5 MG PO TABS
5.0000 mg | ORAL_TABLET | Freq: Two times a day (BID) | ORAL | Status: DC
  Administered 2023-06-19 – 2023-06-24 (×10): 5 mg via ORAL
  Filled 2023-06-19 (×10): qty 1

## 2023-06-19 MED ORDER — INSULIN ASPART 100 UNIT/ML IJ SOLN
0.0000 [IU] | Freq: Three times a day (TID) | INTRAMUSCULAR | Status: DC
  Administered 2023-06-19 – 2023-06-20 (×2): 3 [IU] via SUBCUTANEOUS
  Administered 2023-06-20: 2 [IU] via SUBCUTANEOUS
  Administered 2023-06-21 (×2): 5 [IU] via SUBCUTANEOUS
  Administered 2023-06-22: 3 [IU] via SUBCUTANEOUS
  Administered 2023-06-22 (×2): 2 [IU] via SUBCUTANEOUS
  Administered 2023-06-23: 11 [IU] via SUBCUTANEOUS
  Administered 2023-06-23 – 2023-06-24 (×4): 3 [IU] via SUBCUTANEOUS
  Administered 2023-06-24: 2 [IU] via SUBCUTANEOUS
  Administered 2023-06-25 (×2): 3 [IU] via SUBCUTANEOUS
  Administered 2023-06-25: 2 [IU] via SUBCUTANEOUS
  Administered 2023-06-26 (×2): 3 [IU] via SUBCUTANEOUS
  Administered 2023-06-27: 11 [IU] via SUBCUTANEOUS
  Administered 2023-06-27: 3 [IU] via SUBCUTANEOUS
  Administered 2023-06-28 – 2023-06-30 (×4): 2 [IU] via SUBCUTANEOUS
  Administered 2023-06-30: 3 [IU] via SUBCUTANEOUS
  Administered 2023-07-01: 2 [IU] via SUBCUTANEOUS
  Administered 2023-07-01 – 2023-07-02 (×3): 3 [IU] via SUBCUTANEOUS
  Administered 2023-07-02 – 2023-07-03 (×2): 2 [IU] via SUBCUTANEOUS
  Administered 2023-07-03: 3 [IU] via SUBCUTANEOUS
  Administered 2023-07-03 – 2023-07-04 (×2): 2 [IU] via SUBCUTANEOUS
  Administered 2023-07-04: 3 [IU] via SUBCUTANEOUS
  Administered 2023-07-04 – 2023-07-05 (×2): 2 [IU] via SUBCUTANEOUS
  Administered 2023-07-05: 3 [IU] via SUBCUTANEOUS

## 2023-06-19 MED ORDER — INSULIN ASPART 100 UNIT/ML IJ SOLN
0.0000 [IU] | Freq: Every day | INTRAMUSCULAR | Status: DC
  Administered 2023-06-19: 3 [IU] via SUBCUTANEOUS
  Administered 2023-06-20: 2 [IU] via SUBCUTANEOUS
  Administered 2023-06-21: 5 [IU] via SUBCUTANEOUS
  Administered 2023-06-22: 3 [IU] via SUBCUTANEOUS

## 2023-06-19 NOTE — Progress Notes (Signed)
Progress Note   Patient: Eric Zamora:[id number] DOB: 03-16-1946 DOA: 06/15/2023     4 DOS: the patient was seen and examined on 06/19/2023 at 8:30AM      Brief hospital course: Mr. Kuperman is a 77 y.o. 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, CAD, dCHF, HTN, hx DVT no longer on AC, and HLD who presented with sepsis from rectal abscess            Assessment and Plan: * Severe sepsis due to perirectal abscess(HCC) Stable.  Unfortunately surgery sent no intraoperative cultures.   - Continue vanc, flagyl, cefepime day 5        Atrial fibrillation with rapid ventricular response (HCC) -Transition to Eliquis - Continue metoprolol    Dysphagia MBS today showed nothing focal, main limitation is poor mentation. - Consult SLP - Stop IV fluids - Transition to Dys 1, honey thick  Acute renal failure superimposed on stage 3b chronic kidney disease (HCC) Creatinine improved down to baseline postsurgery  CAD- Moderate LAD disease with moderate to severe ostial first diagonal disease by cath 07/2013 - not ammendable to PCI. 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  Starting to get some peripheral edema, respiratory status is good - Hold home furosemide for today, but monitor closely  Acute metabolic encephalopathy Dementia At baseline the patient has dementia, but is interactive, conversant, just has poor memory.  Here he has been very sluggish and poorly responsive over the last 2 to 3 days.  Suspect this is a metabolic encephalopathy from sepsis - Standard delirium precautions: blinds open and lights on during day, TV off, minimize interruptions at night, glasses/hearing aids, PT/OT, avoiding Beers list  medications   - Hold Keppra for now  Essential hypertension BP normal - Continue metoprolol - Hold home furosemide  Mixed hyperlipidemia -Hold home simvastatin for now  Bipolar disorder (manic depression) (HCC) Not on meds for this at home, no active symptoms  Transaminitis This is slightly better today  On antiepileptic therapy -Hold Keppra given decreased mentation  Uncontrolled type 2 diabetes mellitus with hyperglycemia, without long-term current use of insulin (HCC) Glucose is reasonably controlled - Continue glargine - Continue sliding scale corrections, transition to mealtime dosing  Hyponatremia Na stable   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     Physical Exam: BP (!) 163/63 (BP Location: Right Arm)   Pulse 97   Temp 98.5 F (36.9 C) (Oral)   Resp 18   Ht 5\' 5"  (1.651 m)   Wt 80.1 kg   SpO2 98%   BMI 29.39 kg/m   Adult male, lying in bed, sleepy and not very responsive or communicative Tachycardic, irregular, no murmurs, started to have some upper extremity edema and pitting Respiratory rate seems elevated, lungs diminished, no rales or wheezing Abdomen was continued distention, but no guarding, no tenderness, no rebound Rectal exam is unremarkable, I do not appreciate any fluctuance on the right or induration at all Attention somewhat diminished, affect blunted, poorly cooperative with exam, oriented to self, speech dysarthric  but at baseline, face symmetric  Data Reviewed: Glucose controlled Basic metabolic panel shows creatinine down to 1.5, AST and ALT improving CBC shows white blood cell count down to 12 hemoglobin down to 10.8      Disposition: Status is: Inpatient         Author: Alberteen Sam,  MD 06/19/2023 12:15 PM  For on call review [email or web address].

## 2023-06-19 NOTE — Procedures (Signed)
Modified Barium Swallow Study  Patient Details  Name: SNAYDER CADE MRN: [id number] Date of Birth: [birth year]  Today's Date: 06/19/2023  Modified Barium Swallow completed.  Full report located under Chart Review in the Imaging Section.  History of Present Illness 77 yo male adm with AMS, foul urine odor and dyspnea.  Pt is a resident of [address].  He experienced an aspiration episode yesterday with medications with applesauce and then experienced fever and respiratory difficulties requiring Bipap. Pt has h/o falls, dementia, Afib, CHF. CXR  06/17/2023 Patchy opacities throughout the lungs bilaterally, predominantly dependent, which are concerning for aspiration pneumonia/pneumonitis given the clinical concern for aspiration. 2. Small bilateral pleural effusions (right greater than left). 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  Swallow eval ordered.   Clinical Impression Patient presents with a mild oral and moderate pharyngeal phase dysphagia as per this modified barium swallow study. Swallow was initiated at level of pyriform sinus with thin liquids, nectar thick liquids and honey thick liquids by cup sip and initiated at level of vallecular sinus with puree solids and honey thick liquids via spoon sip. Anterior hyoid excursion, laryngeal elevation and epiglottic inversion were all partial in completion. Initial spoon sips of nectar thick liquids resulted in immediate, sensed aspiration(PAS 7) as portion of bolus entered open airway. Cause of aspiration appeared to be mistiming of laryngeal vestibule closure which led to aspiration before the swallow was initiated as well as poor opening of PES leading to collection of residuals in pyriforms that resulted in aspiration during the swallow. Subsequent swallows of nectar, thin, honey and puree transited through PES without observed difficulty or delay. Sensed aspiration occured with thin and nectar  thick liquids via spoon sips (PAS 7) and trace flash penetration occured with honey thick liquid via cup sip (PAS 2) but no penetration or aspiration observed with puree solids or honey thick liquids via spoon. Trace to no residuals remained in oral cavity or in pharynx s/p initial swallows with all tested bolus consistencies. SLP recommending initiate PO diet of Dys 1 (puree) solids and honey thick liquids via spoon sip only, meds crushed in puree. SLP will follow for diet toleration and ability to advance. Factors that may increase risk of adverse event in presence of aspiration [employer] & Clearance Coots 2021): Reduced cognitive function;Frail or deconditioned;Limited mobility  Swallow Evaluation Recommendations Recommendations: PO diet PO Diet Recommendation: Dysphagia 1 (Pureed);Moderately thick liquids (Level 3, honey thick) Liquid Administration via: Spoon Medication Administration: Crushed with puree Supervision: Full supervision/cueing for swallowing strategies;Full assist for feeding Swallowing strategies  : Small bites/sips;Slow rate Postural changes: Position pt fully upright for meals Oral care recommendations: Oral care BID (2x/day);Staff/trained caregiver to provide oral care Caregiver Recommendations: Have oral suction available;Avoid jello, ice cream, thin soups, popsicles   Angela Nevin, MA, CCC-SLP Speech Therapy

## 2023-06-19 NOTE — Consult Note (Signed)
PHARMACY - ANTICOAGULATION CONSULT NOTE  Pharmacy Consult for heparin IV Indication: atrial fibrillation  No Known Allergies  Patient Measurements: Height: 5\' 5"  (165.1 cm) Weight: 80.1 kg (176 lb 9.4 oz) IBW/kg (Calculated) : 61.5 Heparin Dosing Weight: 77.8 kg   Vital Signs: BP: 145/87 (10/19 0400)  Labs: Recent Labs    06/17/23 0116 06/17/23 0424 06/17/23 0646 06/17/23 0933 06/17/23 2159 06/18/23 0524 06/19/23 0415  HGB 11.6*  --   --   --   --  11.1* 10.8*  HCT 35.1*  --   --   --   --  34.9* 34.6*  PLT 263  --   --   --   --  218 231  APTT  --   --   --   --  74* 63* 85*  HEPARINUNFRC  --   --   --   --  >1.10* >1.10*  --   CREATININE 2.26*   < > 2.28* 2.21*  --  1.72*  --    < > = values in this interval not displayed.    Estimated Creatinine Clearance: 34.5 mL/min (A) (by C-G formula based on SCr of 1.72 mg/dL (H)).   Medical History: Past Medical History:  Diagnosis Date   Alzheimer disease (HCC)    Bipolar affective disorder (HCC)    Chronic renal insufficiency    Dementia (HCC)    Dyslipidemia    HTN (hypertension)    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   gradient on TEE   Syncopal episodes      Assessment: Patient is a 77 y.o. male admitted 06/15/2023 with sepsis. PMH includes hx of DVT, dementia, CKD IIIb, dCHF, NSTEMI, and HTN. EKG found new onset Afib and patient was started on apixaban 5 mg BID. CHA2DS2-Vasc 6 (age, HTN, CHF, DM, vascular disease). Plts 263, Hgb 11.6, INR 1.2. No bleeding reported. Today patient is NPO and pharmacy is being consulted to transition to IV heparin.  aPTT 85, therapeutic on 1350 units/hr Hgb 10.8, plts 231 No bleeding noted  Goal of Therapy:  Heparin level 0.3-0.7 units/ml aPTT 66-102 seconds Monitor platelets by anticoagulation protocol: Yes   Plan:  --continue IV heparin to 1350 units/hr  --Check  aPTT in 8 hours --Daily CBC and heparin level  --Monitor for signs and symptoms of bleeding    Arley Phenix RPh 06/19/2023, 5:02 AM

## 2023-06-19 NOTE — Progress Notes (Signed)
1 Day Post-Op   Subjective/Chief Complaint: Pt with no acute changes overnight   Objective: Vital signs in last 24 hours: Temp:  [99.1 F (37.3 C)-100 F (37.8 C)] 99.8 F (37.7 C) (10/18 1625) Pulse Rate:  [96-121] 108 (10/18 1224) Resp:  [19-32] 20 (10/19 0400) BP: (95-164)/(43-120) 145/87 (10/19 0400) SpO2:  [92 %-98 %] 92 % (10/18 1224) Weight:  [80.1 kg] 80.1 kg (10/18 0942) Last BM Date : 06/17/23  Intake/Output from previous day: 10/18 0701 - 10/19 0700 In: 2854.1 [I.V.:579.8; IV Piggyback:2274.3] Out: 430 [Urine:425; Blood:5] Intake/Output this shift: No intake/output data recorded.  General appearance: alert Incision/Wound: some min drainage to wound  Lab Results:  Recent Labs    06/18/23 0524 06/19/23 0415  WBC 16.4* 12.9*  HGB 11.1* 10.8*  HCT 34.9* 34.6*  PLT 218 231   BMET Recent Labs    06/18/23 0524 06/19/23 0415  NA 138 142  K 4.2 3.9  CL 110 116*  CO2 18* 18*  GLUCOSE 85 111*  BUN 37* 30*  CREATININE 1.72* 1.52*  CALCIUM 7.9* 7.6*   PT/INR No results for input(s): "LABPROT", "INR" in the last 72 hours. ABG Recent Labs    06/17/23 0039 06/17/23 0646  PHART 7.32*  --   HCO3 12.4* 13.3*    Studies/Results: CT CHEST ABDOMEN PELVIS WO CONTRAST  Result Date: 06/17/2023 CLINICAL DATA:  Sepsis.  Patient aspirated applesauce today. EXAM: CT CHEST, ABDOMEN AND PELVIS WITHOUT CONTRAST TECHNIQUE: Multidetector CT imaging of the chest, abdomen and pelvis was performed following the standard protocol without IV contrast. RADIATION DOSE REDUCTION: This exam was performed according to the departmental dose-optimization program which includes automated exposure control, adjustment of the mA and/or kV according to patient size and/or use of iterative reconstruction technique. COMPARISON:  CT of the abdomen and pelvis without contrast 07/02/2013 FINDINGS: CT CHEST FINDINGS Cardiovascular: The heart size is normal. Coronary artery calcifications are  present. Calcifications are present the aortic valve and aortic arch. No aneurysm or stenosis is present. The pulmonary arteries are of normal size. Mediastinum/Nodes: No enlarged mediastinal, hilar, or axillary lymph nodes. Thyroid gland, trachea, and esophagus demonstrate no significant findings. Lungs/Pleura: Patchy ground-glass attenuation is present the right upper lobe posteriorly. Patchy airspace consolidation is present in the lower lobes bilaterally. Is some thickening of the lower airways. Moderate sized bilateral effusions are present. Musculoskeletal: No chest wall mass or suspicious bone lesions identified. CT ABDOMEN PELVIS FINDINGS Hepatobiliary: No focal liver abnormality is seen. No gallstones, gallbladder wall thickening, or biliary dilatation. Pancreas: Unremarkable. No pancreatic ductal dilatation or surrounding inflammatory changes. Spleen: Normal in size without focal abnormality. Adrenals/Urinary Tract: A 2.4 cm water density lesion posteriorly in the left kidney presents a simple cyst. No other renal lesions are present. No stone or obstruction is present. The ureters are within normal limits bilaterally. The urinary bladder is mildly distended, measuring to 12.4 cm. Irregular nodularity is present the bladder base associated with the prostate gland. Stomach/Bowel: The stomach and duodenum are within normal limits. The small bowel is unremarkable. The terminal ileum is within normal limits. The appendix is visualized and normal. The ascending and transverse colon are within normal limits. The descending and proximal sigmoid colon are. The distal sigmoid colon and proximal rectum are dilated to 6 cm in transverse diameter. A right perirectal collection of fluid and gas measures 6.2 x 3.2 x 3.3 cm. Vascular/Lymphatic: Atherosclerotic calcifications are present in the aorta and branch vessels. No significant adenopathy is present. Reproductive: Status  post hysterectomy. No adnexal masses. Other:  No abdominal wall hernia or abnormality. No abdominopelvic ascites. Musculoskeletal: Inferior endplate fracture at L3 demonstrates 30% loss of height anteriorly without focal kyphosis. No focal osseous lesions are present. Bony pelvis is within normal limits. The hips are located and normal bilaterally. IMPRESSION: 1. 6.2 x 3.2 x 3.3 cm right perirectal collection of fluid and gas consistent with a perirectal abscess and fistula. 2. The distal sigmoid colon and proximal rectum are dilated to 6 cm in transverse diameter just proximal to the suspected fistula. No significant proximal obstruction is present. 3. Patchy ground-glass attenuation in the right upper lobe posteriorly is concerning for infection. 4. Patchy airspace consolidation in the lower lobes bilaterally compatible with pneumonia. 5. Moderate sized bilateral effusions. 6. Coronary artery disease. 7. Inferior endplate fracture at L3 demonstrates 30% loss of height anteriorly without focal kyphosis. This is age indeterminate. 8. Irregular nodularity the bladder base associated with the prostate gland. This may be related to the prostate gland. Neoplasm is not excluded. Recommend correlation with PSA. 9. 2.4 cm simple cyst posteriorly in the left kidney. No follow-up imaging is recommended. JACR 2018 Feb; 264-273, Management of the Incidental Renal Mass on CT, [employer] 2021; 814-848, Bosniak Classification of Cystic Renal Masses, Version 2019. 10.  Aortic Atherosclerosis (ICD10-I70.0). Electronically Signed   By: Marin Roberts M.D.   On: 06/17/2023 18:33    Anti-infectives: Anti-infectives (From admission, onward)    Start     Dose/Rate Route Frequency Ordered Stop   06/19/23 0000  vancomycin (VANCOREADY) IVPB 1250 mg/250 mL        1,250 mg 166.7 mL/hr over 90 Minutes Intravenous Every 36 hours 06/18/23 0939     06/18/23 1800  ceFEPIme (MAXIPIME) 2 g in sodium chloride 0.9 % 100 mL IVPB        2 g 200 mL/hr over 30 Minutes Intravenous  Every 12 hours 06/18/23 0939     06/18/23 0600  ceFEPIme (MAXIPIME) 2 g in sodium chloride 0.9 % 100 mL IVPB  Status:  Discontinued        2 g 200 mL/hr over 30 Minutes Intravenous Every 24 hours 06/17/23 0920 06/18/23 0939   06/17/23 1400  vancomycin (VANCOCIN) IVPB 1000 mg/200 mL premix  Status:  Discontinued        1,000 mg 200 mL/hr over 60 Minutes Intravenous Every 36 hours 06/17/23 0920 06/18/23 0939   06/16/23 1800  vancomycin (VANCOREADY) IVPB 750 mg/150 mL  Status:  Discontinued        750 mg 150 mL/hr over 60 Minutes Intravenous Every 24 hours 06/15/23 1639 06/17/23 0920   06/16/23 1100  cefTRIAXone (ROCEPHIN) 2 g in sodium chloride 0.9 % 100 mL IVPB  Status:  Discontinued        2 g 200 mL/hr over 30 Minutes Intravenous Every 24 hours 06/15/23 1453 06/15/23 1455   06/15/23 1800  ceFEPIme (MAXIPIME) 2 g in sodium chloride 0.9 % 100 mL IVPB  Status:  Discontinued        2 g 200 mL/hr over 30 Minutes Intravenous Every 12 hours 06/15/23 1558 06/17/23 0920   06/15/23 1700  metroNIDAZOLE (FLAGYL) IVPB 500 mg        500 mg 100 mL/hr over 60 Minutes Intravenous Every 12 hours 06/15/23 1455     06/15/23 1630  vancomycin (VANCOREADY) IVPB 1500 mg/300 mL        1,500 mg 150 mL/hr over 120 Minutes Intravenous  Once 06/15/23 1558 06/15/23 2058  06/15/23 1045  cefTRIAXone (ROCEPHIN) 2 g in sodium chloride 0.9 % 100 mL IVPB        2 g 200 mL/hr over 30 Minutes Intravenous  Once 06/15/23 1041 06/15/23 1155       Assessment/Plan: s/p Procedure(s): IRRIGATION AND DEBRIDEMENT PERIRECTAL ABSCESS (N/A) POD 1 -DC packing today, con't with penrose dressing -sitz baths -con't abx per primary  LOS: 4 days    Axel Filler 06/19/2023

## 2023-06-19 NOTE — Discharge Instructions (Addendum)

## 2023-06-19 NOTE — Progress Notes (Signed)
Speech Language Pathology Treatment: Dysphagia  Patient Details Name: Eric Zamora MRN: [id number] DOB: 19-May-1946 Today's Date: 06/19/2023 Time: 4098-1191 SLP Time Calculation (min) (ACUTE ONLY): 10 min  Assessment / Plan / Recommendation Clinical Impression  Patient seen by SLP for skilled treatment focused on dysphagia goals to determine readiness for MBS. Patient had eyes closed but verbally responded and interacted when cued. He was oriented to place, stating "[facility]". After elevating HOB, SLP administered spoon sips of water. First sip was approximately 3/4 spoon sip and resulted in immediate coughing. Second sip was approximately 1/4 spoon sip and resulted in immediate coughing. SLP recommending to proceed with MBS which will be planned for today.     HPI HPI: 77 yo male adm with AMS, foul urine odor and dyspnea.  Pt is a resident of [address].  He experienced an aspiration episode yesterday with medications with applesauce and then experienced fever and respiratory difficulties requiring Bipap. Pt has h/o falls, dementia, Afib, CHF. CXR  06/17/2023 Patchy opacities throughout the lungs bilaterally, predominantly dependent, which are concerning for aspiration pneumonia/pneumonitis given the clinical concern for aspiration. 2. Small bilateral pleural effusions (right greater than left). 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  Swallow eval ordered.      SLP Plan  MBS      Recommendations for follow up therapy are one component of a multi-disciplinary discharge planning process, led by the attending physician.  Recommendations may be updated based on patient status, additional functional criteria and insurance authorization.    Recommendations  Diet recommendations: NPO Medication Administration: Crushed with puree                  Oral care QID   Frequent or constant Supervision/Assistance Dysphagia, oral  phase (R13.11)     MBS    Angela Nevin, MA, CCC-SLP Speech Therapy

## 2023-06-20 DIAGNOSIS — A419 Sepsis, unspecified organism: Secondary | ICD-10-CM | POA: Diagnosis not present

## 2023-06-20 DIAGNOSIS — E1110 Type 2 diabetes mellitus with ketoacidosis without coma: Secondary | ICD-10-CM | POA: Diagnosis not present

## 2023-06-20 DIAGNOSIS — I4891 Unspecified atrial fibrillation: Secondary | ICD-10-CM | POA: Diagnosis not present

## 2023-06-20 DIAGNOSIS — N179 Acute kidney failure, unspecified: Secondary | ICD-10-CM | POA: Diagnosis not present

## 2023-06-20 LAB — GLUCOSE, CAPILLARY
Glucose-Capillary: 113 mg/dL — ABNORMAL HIGH (ref 70–99)
Glucose-Capillary: 123 mg/dL — ABNORMAL HIGH (ref 70–99)
Glucose-Capillary: 142 mg/dL — ABNORMAL HIGH (ref 70–99)
Glucose-Capillary: 187 mg/dL — ABNORMAL HIGH (ref 70–99)
Glucose-Capillary: 82 mg/dL (ref 70–99)
Glucose-Capillary: 88 mg/dL (ref 70–99)

## 2023-06-20 LAB — CULTURE, BLOOD (ROUTINE X 2)
Culture: NO GROWTH
Culture: NO GROWTH
Special Requests: ADEQUATE

## 2023-06-20 LAB — COMPREHENSIVE METABOLIC PANEL
ALT: 206 U/L — ABNORMAL HIGH (ref 0–44)
AST: 152 U/L — ABNORMAL HIGH (ref 15–41)
Albumin: 2.4 g/dL — ABNORMAL LOW (ref 3.5–5.0)
Alkaline Phosphatase: 34 U/L — ABNORMAL LOW (ref 38–126)
Anion gap: 6 (ref 5–15)
BUN: 26 mg/dL — ABNORMAL HIGH (ref 8–23)
CO2: 20 mmol/L — ABNORMAL LOW (ref 22–32)
Calcium: 7.8 mg/dL — ABNORMAL LOW (ref 8.9–10.3)
Chloride: 119 mmol/L — ABNORMAL HIGH (ref 98–111)
Creatinine, Ser: 1.44 mg/dL — ABNORMAL HIGH (ref 0.61–1.24)
GFR, Estimated: 50 mL/min — ABNORMAL LOW (ref >60)
Glucose, Bld: 91 mg/dL (ref 70–99)
Potassium: 3.7 mmol/L (ref 3.5–5.1)
Sodium: 145 mmol/L (ref 135–145)
Total Bilirubin: 0.5 mg/dL (ref 0.3–1.2)
Total Protein: 6.5 g/dL (ref 6.5–8.1)

## 2023-06-20 LAB — CBC
HCT: 36.3 % — ABNORMAL LOW (ref 39.0–52.0)
Hemoglobin: 11.5 g/dL — ABNORMAL LOW (ref 13.0–17.0)
MCH: 29.3 pg (ref 26.0–34.0)
MCHC: 31.7 g/dL (ref 30.0–36.0)
MCV: 92.4 fL (ref 80.0–100.0)
Platelets: 253 10*3/uL (ref 150–400)
RBC: 3.93 MIL/uL — ABNORMAL LOW (ref 4.22–5.81)
RDW: 13.5 % (ref 11.5–15.5)
WBC: 9.7 10*3/uL (ref 4.0–10.5)
nRBC: 0.2 % (ref 0.0–0.2)

## 2023-06-20 MED ORDER — FUROSEMIDE 40 MG PO TABS
40.0000 mg | ORAL_TABLET | Freq: Every day | ORAL | Status: DC
  Administered 2023-06-20 – 2023-06-21 (×2): 40 mg via ORAL
  Filled 2023-06-20 (×2): qty 1

## 2023-06-20 MED ORDER — POTASSIUM CHLORIDE CRYS ER 20 MEQ PO TBCR
20.0000 meq | EXTENDED_RELEASE_TABLET | Freq: Once | ORAL | Status: AC
  Administered 2023-06-20: 20 meq via ORAL
  Filled 2023-06-20: qty 1

## 2023-06-20 MED ORDER — VANCOMYCIN HCL 1500 MG/300ML IV SOLN
1500.0000 mg | INTRAVENOUS | Status: DC
  Administered 2023-06-20: 1500 mg via INTRAVENOUS
  Filled 2023-06-20 (×3): qty 300

## 2023-06-20 NOTE — Progress Notes (Signed)
Progress Note   Patient: Eric Zamora:[id number] DOB: [birth year] DOA: 06/15/2023     5 DOS: the patient was seen and examined on 06/20/2023 at 11:15 AM      Brief hospital course: Eric Zamora is a 77 y.o. 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, CAD, dCHF, HTN, hx DVT no longer on AC, and HLD who presented with sepsis from rectal abscess    Assessment and Plan: Sepsis due to rectal abscess - Continue vancomycin, Flagyl, cefepime, day 6 -Consult surgery for Penrose drain, appreciate recommendations  Atrial fibrillation New onset, rate mostly controlled - Continue Eliquis, metoprolol  Dysphagia - SLP - Continue dysphagia 1 diet, honey thick  AKI Resolved -Resume home furosemide  Arm swelling Due to fluids, ultrasound deferred given already on anticoagulation indefinitely - Elevate arm - Resume home diuretics  Coronary disease - Continue metoprolol  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  Nursing have no concerns.     Physical Exam: BP (!) 169/70   Pulse (!) 116   Temp 98.3 F (36.8 C) (Axillary)   Resp 14   Ht 5\' 5"  (1.651 m)   Wt 80.1 kg   SpO2 98%   BMI 29.39 kg/m   Elderly adult male, lying in bed, opens eyes scraggly to voice, answers some questions, then closed with again Heart rate irregular, rhythm irregular, no murmurs, upper extremity pitting edema in the left arm Respiratory rate normal, lungs diminished, no rales or  wheezing Abdomen distended, no guarding, no tenderness, no rebound Attention diminished, affect blunted, poorly cooperative with exam, oriented to self, speech dysarthric but at baseline, face symmetric    Data Reviewed: Transaminases trending down Sodium potassium normal, creatinine down to 1.4 White blood cell count down to normal Hemoglobin 11.5 glucose normal  Family Communication: None    Disposition: Status is: Inpatient         Author: Alberteen Sam, MD 06/20/2023 11:34 AM  For on call review [email or web address].

## 2023-06-20 NOTE — Plan of Care (Signed)
  Problem: Coping: Goal: Ability to adjust to condition or change in health will improve Outcome: Progressing   Problem: Fluid Volume: Goal: Ability to maintain a balanced intake and output will improve Outcome: Progressing   Problem: Metabolic: Goal: Ability to maintain appropriate glucose levels will improve Outcome: Progressing   Problem: Nutritional: Goal: Maintenance of adequate nutrition will improve Outcome: Progressing   Problem: Tissue Perfusion: Goal: Adequacy of tissue perfusion will improve Outcome: Progressing   Problem: Clinical Measurements: Goal: Will remain free from infection Outcome: Progressing Goal: Cardiovascular complication will be avoided Outcome: Progressing   Problem: Pain Managment: Goal: General experience of comfort will improve Outcome: Progressing   Problem: Safety: Goal: Ability to remain free from injury will improve Outcome: Progressing

## 2023-06-20 NOTE — Progress Notes (Signed)
Pharmacy Antibiotic Note  Eric Zamora is a 77 y.o. male admitted on 06/15/2023 with sepsis. Pharmacy has been consulted for vancomycin and cefepime dosing.  06/20/2023 Day # 6  flagyl, cefepime, and vancomycin  10/18 CT shows perirectal abscess and pneumonia. WBC down to 9.7; Afebrile s/p I&D of perirectal abscess 10/18 Creatinine improved today to 1.44 (CrCl 41.2 ml/min).   Plan: continue cefepime dosage to 2 g IV q12h  Change vancomycin dosage to 1500 mg IV q36h (AUC 496.5, wt of 80.1 kg, Scr 1.44 mg/dl)  Continue Flagyl 161 mg IV q12  Monitor clinical course, renal function, cultures as available   Height: 5\' 5"  (165.1 cm) Weight: 80.1 kg (176 lb 9.4 oz) IBW/kg (Calculated) : 61.5  Temp (24hrs), Avg:98.5 F (36.9 C), Min:97.7 F (36.5 C), Max:99 F (37.2 C)  Recent Labs  Lab 06/15/23 1138 06/15/23 1305 06/15/23 1358 06/16/23 0300 06/16/23 0438 06/17/23 0116 06/17/23 0424 06/17/23 0646 06/17/23 0933 06/18/23 0524 06/18/23 1633 06/19/23 0415 06/20/23 0302  WBC  --    < >  --  14.5*  --  22.4*  --   --   --  16.4*  --  12.9* 9.7  CREATININE  --   --   --   --    < > 2.26* 2.40* 2.28* 2.21* 1.72*  --  1.52* 1.44*  LATICACIDVEN 2.9*  --  1.9  --   --  3.6* 5.2*  --   --   --  1.9  --   --    < > = values in this interval not displayed.    Estimated Creatinine Clearance: 41.2 mL/min (A) (by C-G formula based on SCr of 1.44 mg/dL (H)).    No Known Allergies  Antimicrobials this admission: 10/15 ceftriaxone x 1  10/15 vancomycin >>  10/15 cefepime  >>  10/15 flagyl >>  Dose adjustments this admission: Vancomycin 750 mg q24 >> 1000 mg q36 >> 1250 mg q36> 1500 q36 Cefepime 2 q q12 >> 2 g q24 >> 2 g q12  Microbiology results: 10/15 BCx: NGF 10/15 MRSA PCR: positive  10/16 Respiratory panel: negative  10/17 BCx: NGTD  Herby Abraham, Pharm.D Use secure chat for questions 06/20/2023 7:24 AM

## 2023-06-20 NOTE — Plan of Care (Signed)
  Problem: Education: Goal: Ability to describe self-care measures that may prevent or decrease complications (Diabetes Survival Skills Education) will improve Outcome: Progressing Goal: Individualized Educational Video(s) Outcome: Progressing   Problem: Coping: Goal: Ability to adjust to condition or change in health will improve Outcome: Progressing   Problem: Fluid Volume: Goal: Ability to maintain a balanced intake and output will improve Outcome: Progressing   Problem: Health Behavior/Discharge Planning: Goal: Ability to identify and utilize available resources and services will improve Outcome: Progressing Goal: Ability to manage health-related needs will improve Outcome: Progressing   Problem: Metabolic: Goal: Ability to maintain appropriate glucose levels will improve Outcome: Progressing   Problem: Nutritional: Goal: Maintenance of adequate nutrition will improve Outcome: Progressing Goal: Progress toward achieving an optimal weight will improve Outcome: Progressing   Problem: Skin Integrity: Goal: Risk for impaired skin integrity will decrease Outcome: Progressing   Problem: Tissue Perfusion: Goal: Adequacy of tissue perfusion will improve Outcome: Progressing   Problem: Education: Goal: Knowledge of General Education information will improve Description: Including pain rating scale, medication(s)/side effects and non-pharmacologic comfort measures Outcome: Progressing   Problem: Health Behavior/Discharge Planning: Goal: Ability to manage health-related needs will improve Outcome: Progressing   Problem: Clinical Measurements: Goal: Ability to maintain clinical measurements within normal limits will improve Outcome: Progressing Goal: Will remain free from infection Outcome: Progressing Goal: Diagnostic test results will improve Outcome: Progressing Goal: Respiratory complications will improve Outcome: Progressing Goal: Cardiovascular complication will  be avoided Outcome: Progressing   Problem: Activity: Goal: Risk for activity intolerance will decrease Outcome: Progressing   Problem: Nutrition: Goal: Adequate nutrition will be maintained Outcome: Progressing   Problem: Coping: Goal: Level of anxiety will decrease Outcome: Progressing   Problem: Elimination: Goal: Will not experience complications related to bowel motility Outcome: Progressing Goal: Will not experience complications related to urinary retention Outcome: Progressing   Problem: Pain Managment: Goal: General experience of comfort will improve Outcome: Progressing   Problem: Safety: Goal: Ability to remain free from injury will improve Outcome: Progressing   Problem: Skin Integrity: Goal: Risk for impaired skin integrity will decrease Outcome: Progressing   Problem: Fluid Volume: Goal: Hemodynamic stability will improve Outcome: Progressing   Problem: Clinical Measurements: Goal: Diagnostic test results will improve Outcome: Progressing Goal: Signs and symptoms of infection will decrease Outcome: Progressing   Problem: Respiratory: Goal: Ability to maintain adequate ventilation will improve Outcome: Progressing   Problem: Education: Goal: Ability to describe self-care measures that may prevent or decrease complications (Diabetes Survival Skills Education) will improve Outcome: Progressing Goal: Individualized Educational Video(s) Outcome: Progressing   Problem: Cardiac: Goal: Ability to maintain an adequate cardiac output will improve Outcome: Progressing   Problem: Health Behavior/Discharge Planning: Goal: Ability to identify and utilize available resources and services will improve Outcome: Progressing Goal: Ability to manage health-related needs will improve Outcome: Progressing   Problem: Fluid Volume: Goal: Ability to achieve a balanced intake and output will improve Outcome: Progressing   Problem: Metabolic: Goal: Ability to  maintain appropriate glucose levels will improve Outcome: Progressing   Problem: Nutritional: Goal: Maintenance of adequate nutrition will improve Outcome: Progressing Goal: Maintenance of adequate weight for body size and type will improve Outcome: Progressing   Problem: Respiratory: Goal: Will regain and/or maintain adequate ventilation Outcome: Progressing   Problem: Urinary Elimination: Goal: Ability to achieve and maintain adequate renal perfusion and functioning will improve Outcome: Progressing

## 2023-06-21 DIAGNOSIS — I4891 Unspecified atrial fibrillation: Secondary | ICD-10-CM | POA: Diagnosis not present

## 2023-06-21 DIAGNOSIS — N179 Acute kidney failure, unspecified: Secondary | ICD-10-CM | POA: Diagnosis not present

## 2023-06-21 DIAGNOSIS — I2510 Atherosclerotic heart disease of native coronary artery without angina pectoris: Secondary | ICD-10-CM | POA: Diagnosis not present

## 2023-06-21 DIAGNOSIS — A419 Sepsis, unspecified organism: Secondary | ICD-10-CM | POA: Diagnosis not present

## 2023-06-21 LAB — GLUCOSE, CAPILLARY
Glucose-Capillary: 119 mg/dL — ABNORMAL HIGH (ref 70–99)
Glucose-Capillary: 217 mg/dL — ABNORMAL HIGH (ref 70–99)
Glucose-Capillary: 201 mg/dL — ABNORMAL HIGH (ref 70–99)
Glucose-Capillary: 250 mg/dL — ABNORMAL HIGH (ref 70–99)

## 2023-06-21 LAB — COMPREHENSIVE METABOLIC PANEL
ALT: 142 U/L — ABNORMAL HIGH (ref 0–44)
AST: 84 U/L — ABNORMAL HIGH (ref 15–41)
Albumin: 2.4 g/dL — ABNORMAL LOW (ref 3.5–5.0)
Alkaline Phosphatase: 34 U/L — ABNORMAL LOW (ref 38–126)
Anion gap: 11 (ref 5–15)
BUN: 22 mg/dL (ref 8–23)
CO2: 17 mmol/L — ABNORMAL LOW (ref 22–32)
Calcium: 7.8 mg/dL — ABNORMAL LOW (ref 8.9–10.3)
Chloride: 116 mmol/L — ABNORMAL HIGH (ref 98–111)
Creatinine, Ser: 1.5 mg/dL — ABNORMAL HIGH (ref 0.61–1.24)
GFR, Estimated: 47 mL/min — ABNORMAL LOW (ref >60)
Glucose, Bld: 115 mg/dL — ABNORMAL HIGH (ref 70–99)
Potassium: 3.9 mmol/L (ref 3.5–5.1)
Sodium: 144 mmol/L (ref 135–145)
Total Bilirubin: 0.7 mg/dL (ref 0.3–1.2)
Total Protein: 6.5 g/dL (ref 6.5–8.1)

## 2023-06-21 LAB — CBC
HCT: 37.9 % — ABNORMAL LOW (ref 39.0–52.0)
Hemoglobin: 12.5 g/dL — ABNORMAL LOW (ref 13.0–17.0)
MCH: 28.7 pg (ref 26.0–34.0)
MCHC: 33 g/dL (ref 30.0–36.0)
MCV: 87.1 fL (ref 80.0–100.0)
Platelets: 273 10*3/uL (ref 150–400)
RBC: 4.35 MIL/uL (ref 4.22–5.81)
RDW: 13.2 % (ref 11.5–15.5)
WBC: 10.9 10*3/uL — ABNORMAL HIGH (ref 4.0–10.5)
nRBC: 0 % (ref 0.0–0.2)

## 2023-06-21 MED ORDER — AMOXICILLIN-POT CLAVULANATE 875-125 MG PO TABS
1.0000 | ORAL_TABLET | Freq: Two times a day (BID) | ORAL | Status: DC
  Administered 2023-06-21 – 2023-06-24 (×7): 1 via ORAL
  Filled 2023-06-21 (×7): qty 1

## 2023-06-21 MED ORDER — ENSURE ENLIVE PO LIQD
237.0000 mL | Freq: Two times a day (BID) | ORAL | Status: DC
  Administered 2023-06-22 – 2023-07-03 (×16): 237 mL via ORAL

## 2023-06-21 MED ORDER — DOXYCYCLINE HYCLATE 100 MG PO TABS
100.0000 mg | ORAL_TABLET | Freq: Two times a day (BID) | ORAL | Status: DC
  Administered 2023-06-21 – 2023-07-03 (×25): 100 mg via ORAL
  Filled 2023-06-21 (×26): qty 1

## 2023-06-21 MED ORDER — METOPROLOL TARTRATE 25 MG PO TABS
25.0000 mg | ORAL_TABLET | Freq: Once | ORAL | Status: AC
  Administered 2023-06-21: 25 mg via ORAL
  Filled 2023-06-21: qty 1

## 2023-06-21 MED ORDER — METOPROLOL TARTRATE 50 MG PO TABS: 50.0000 mg | ORAL_TABLET | Freq: Two times a day (BID) | ORAL | Status: DC

## 2023-06-21 MED ORDER — FUROSEMIDE 10 MG/ML IJ SOLN
40.0000 mg | Freq: Once | INTRAMUSCULAR | Status: AC
  Administered 2023-06-21: 40 mg via INTRAVENOUS
  Filled 2023-06-21: qty 4

## 2023-06-21 MED ORDER — FUROSEMIDE 10 MG/ML IJ SOLN: 20.0000 mg | Freq: Once | INTRAMUSCULAR | Status: DC

## 2023-06-21 MED ORDER — METOPROLOL TARTRATE 50 MG PO TABS
75.0000 mg | ORAL_TABLET | Freq: Two times a day (BID) | ORAL | Status: DC
  Administered 2023-06-21 – 2023-06-22 (×2): 75 mg via ORAL
  Filled 2023-06-21: qty 1

## 2023-06-21 NOTE — Progress Notes (Signed)
Speech Language Pathology Treatment: Dysphagia  Patient Details Name: Eric Zamora MRN: [id number] DOB: August 10, 1946 Today's Date: 06/21/2023 Time: 4098-1191 SLP Time Calculation (min) (ACUTE ONLY): 18 min  Assessment / Plan / Recommendation Clinical Impression  Patient seen by SLP for skilled intervention focused on dysphagia. Patient was asleep when SLP entered the room but awoke to sound of voice. His eyes remained closed throughout the session but he was cooperative and responsive to SLP. SLP administered honey thick liquids and puree via teaspoon. Patient tolerated both consistencies well and seemed to have a good appetite. He completed a 4oz applesauce and 4oz honey-thick juice. Occasional verbal cueing was required to cue patient to swallow before a subsequent bite could be provided. Patient continues to present with diminished alertness. Continue dys 1 (puree) and honey-thick liquid at this time. SLP will continue to follow up for advanced PO trials and diet toleration monitoring as patient alertness improves.   HPI HPI: 77 yo male adm with AMS, foul urine odor and dyspnea.  Pt is a resident of [address].  He experienced an aspiration episode yesterday with medications with applesauce and then experienced fever and respiratory difficulties requiring Bipap. Pt has h/o falls, dementia, Afib, CHF. CXR  06/17/2023 Patchy opacities throughout the lungs bilaterally, predominantly dependent, which are concerning for aspiration pneumonia/pneumonitis given the clinical concern for aspiration. 2. Small bilateral pleural effusions (right greater than left). 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 MBS completed on 10/19. Patient on dys 1 (puree) and honey thick liquids.      SLP Plan  Continue with current plan of care      Recommendations for follow up therapy are one component of a multi-disciplinary discharge planning process, led by the attending  physician.  Recommendations may be updated based on patient status, additional functional criteria and insurance authorization.    Recommendations  Diet recommendations: Dysphagia 1 (puree);Honey-thick liquid Liquids provided via: Teaspoon;Cup Medication Administration: Crushed with puree Supervision: Full supervision/cueing for compensatory strategies Compensations: Small sips/bites;Slow rate;Minimize environmental distractions Postural Changes and/or Swallow Maneuvers: Seated upright 90 degrees;Upright 30-60 min after meal                  Oral care QID   Frequent or constant Supervision/Assistance Dysphagia, oropharyngeal phase (R13.12)     Continue with current plan of care     Marline Backbone, B.S., Speech Therapy Student   06/21/2023, 11:08 AM

## 2023-06-21 NOTE — Progress Notes (Signed)
3 Days Post-Op   Subjective/Chief Complaint: PT doing well    Objective: Vital signs in last 24 hours: Temp:  [98.2 F (36.8 C)-98.9 F (37.2 C)] 98.8 F (37.1 C) (10/21 0549) Pulse Rate:  [78-129] 129 (10/21 0904) Resp:  [16-20] 18 (10/21 0549) BP: (130-157)/(65-107) 133/66 (10/21 0549) SpO2:  [98 %-100 %] 100 % (10/21 0549) Last BM Date : 06/21/23  Intake/Output from previous day: 10/20 0701 - 10/21 0700 In: 555.3 [P.O.:220; IV Piggyback:335.3] Out: 1500 [Urine:1500] Intake/Output this shift: No intake/output data recorded.  General appearance: alert and cooperative Incision/Wound: penrose in place  Lab Results:  Recent Labs    06/20/23 0302 06/21/23 0544  WBC 9.7 10.9*  HGB 11.5* 12.5*  HCT 36.3* 37.9*  PLT 253 273   BMET Recent Labs    06/20/23 0302 06/21/23 0544  NA 145 144  K 3.7 3.9  CL 119* 116*  CO2 20* 17*  GLUCOSE 91 115*  BUN 26* 22  CREATININE 1.44* 1.50*  CALCIUM 7.8* 7.8*   PT/INR No results for input(s): "LABPROT", "INR" in the last 72 hours. ABG No results for input(s): "PHART", "HCO3" in the last 72 hours.  Invalid input(s): "PCO2", "PO2"  Studies/Results: No results found.  Anti-infectives: Anti-infectives (From admission, onward)    Start     Dose/Rate Route Frequency Ordered Stop   06/20/23 1200  vancomycin (VANCOREADY) IVPB 1500 mg/300 mL        1,500 mg 150 mL/hr over 120 Minutes Intravenous Every 36 hours 06/20/23 0719     06/19/23 0000  vancomycin (VANCOREADY) IVPB 1250 mg/250 mL  Status:  Discontinued        1,250 mg 166.7 mL/hr over 90 Minutes Intravenous Every 36 hours 06/18/23 0939 06/20/23 0719   06/18/23 1800  ceFEPIme (MAXIPIME) 2 g in sodium chloride 0.9 % 100 mL IVPB        2 g 200 mL/hr over 30 Minutes Intravenous Every 12 hours 06/18/23 0939     06/18/23 0600  ceFEPIme (MAXIPIME) 2 g in sodium chloride 0.9 % 100 mL IVPB  Status:  Discontinued        2 g 200 mL/hr over 30 Minutes Intravenous Every 24  hours 06/17/23 0920 06/18/23 0939   06/17/23 1400  vancomycin (VANCOCIN) IVPB 1000 mg/200 mL premix  Status:  Discontinued        1,000 mg 200 mL/hr over 60 Minutes Intravenous Every 36 hours 06/17/23 0920 06/18/23 0939   06/16/23 1800  vancomycin (VANCOREADY) IVPB 750 mg/150 mL  Status:  Discontinued        750 mg 150 mL/hr over 60 Minutes Intravenous Every 24 hours 06/15/23 1639 06/17/23 0920   06/16/23 1100  cefTRIAXone (ROCEPHIN) 2 g in sodium chloride 0.9 % 100 mL IVPB  Status:  Discontinued        2 g 200 mL/hr over 30 Minutes Intravenous Every 24 hours 06/15/23 1453 06/15/23 1455   06/15/23 1800  ceFEPIme (MAXIPIME) 2 g in sodium chloride 0.9 % 100 mL IVPB  Status:  Discontinued        2 g 200 mL/hr over 30 Minutes Intravenous Every 12 hours 06/15/23 1558 06/17/23 0920   06/15/23 1700  metroNIDAZOLE (FLAGYL) IVPB 500 mg        500 mg 100 mL/hr over 60 Minutes Intravenous Every 12 hours 06/15/23 1455     06/15/23 1630  vancomycin (VANCOREADY) IVPB 1500 mg/300 mL        1,500 mg 150 mL/hr over 120  Minutes Intravenous  Once 06/15/23 1558 06/15/23 2058   06/15/23 1045  cefTRIAXone (ROCEPHIN) 2 g in sodium chloride 0.9 % 100 mL IVPB        2 g 200 mL/hr over 30 Minutes Intravenous  Once 06/15/23 1041 06/15/23 1155       Assessment/Plan: s/p Procedure(s): IRRIGATION AND DEBRIDEMENT PERIRECTAL ABSCESS (N/A)  POD 3 - con't with penrose dressing -sitz baths -con't abx per primary  LOS: 6 days    Axel Filler 06/21/2023

## 2023-06-21 NOTE — Progress Notes (Signed)
Progress Note   Patient: Eric Zamora OZH:[id number] DOB: Dec 22, 1945 DOA: 06/15/2023     6 DOS: the patient was seen and examined on 06/21/2023        Brief hospital course: Eric Zamora is a 77 y.o. 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, CAD, dCHF, HTN, hx DVT no longer on AC, and HLD who presented with tachypnea, fever.  Found to have rectal abscess.      Assessment and Plan: * Severe sepsis due to perirectal abscess(HCC) - Continue antibiotics, transition to oral      Diabetic ketoacidosis without coma associated with type 2 diabetes mellitus (HCC) Resolved   Atrial fibrillation with rapid ventricular response (HCC) - Continue metoprolol, increase dose - Continue apixaban    Dysphagia Doing well with Dys 1 diet   Acute renal failure superimposed on stage 3b chronic kidney disease (HCC) Resolved  CAD- Moderate LAD disease with moderate to severe ostial first diagonal disease by cath 07/2013 - not ammendable to PCI. 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   Uncontrolled type 2 diabetes mellitus with hyperglycemia, without long-term current use of insulin (HCC) Controlled - Continue Glargine - Continue SS corrections          Subjective: Patient is eating well per nursing, he has no complaints, no dyspnea, no chest discomfort, no abdominal pain, no rectal pain.     Physical Exam: BP 119/85 (BP Location: Left Arm)   Pulse 95   Temp 99.3 F (37.4 C) (Oral)   Resp 18   Ht 5\' 5"  (1.651 m)   Wt 80.1 kg   SpO2 97%   BMI 29.39 kg/m   Elderly adult male, lying in bed, opens eyes scraggly to  voice, answers some questions, then closed with again Heart rate irregular, rhythm irregular, no murmurs, upper extremity pitting edema in the left arm Respiratory rate normal, lungs diminished, no rales or wheezing Abdomen distended, no guarding, no tenderness, no rebound Attention diminished, affect blunted, poorly cooperative with exam, oriented to self, speech dysarthric but at baseline, face symmetric  Data Reviewed: LFTs improving, white blood cell count down to 10 Glucose normal Creatinine 1.5, no change Blood cultures no growth      Disposition: Status is: Inpatient The patient was walking with no assistance or devices, eating reg diet chopped, feeding self and verbal with some slur only.  Here he is muhc more debilitated, unable to sit up, feed himself.  He will need acute rehab prior to returning to ALF          Author: Alberteen Sam, MD 06/21/2023 5:50 PM  For on call review [email or web address].

## 2023-06-21 NOTE — TOC Progression Note (Signed)
Transition of Care [facility]) - Progression Note    Patient Details  Name: Eric Zamora MRN: [id number] Date of Birth: [birth year]  [facility]) CM/SW Contact  Otelia Santee, LCSW Phone Number: 06/21/2023, 3:28 PM  Clinical Narrative:    [facility] consulted for SNF placement. TOC will continue to follow for PT eval as this is needed for SNF referrals.    Expected Discharge Plan: Skilled Nursing Facility Barriers to Discharge: Continued Medical Work up  Expected Discharge Plan and Services     Post Acute Care Choice: Skilled Nursing Facility Living arrangements for the past 2 months: Assisted Living Facility [facility])                                       Social Determinants of Health (SDOH) Interventions SDOH Screenings   Food Insecurity: Patient Unable To Answer (06/15/2023)  Housing: Low Risk  (06/15/2023)  Transportation Needs: Patient Unable To Answer (06/15/2023)  Utilities: Patient Unable To Answer (06/15/2023)  Tobacco Use: Low Risk  (06/18/2023)    Readmission Risk Interventions     No data to display

## 2023-06-21 NOTE — Progress Notes (Signed)
   06/21/23 1247  Assess: MEWS Score  Temp 99.6 F (37.6 C)  BP 125/70  MAP (mmHg) 85  Pulse Rate (!) 133  Level of Consciousness Alert  SpO2 100 %  O2 Device Room Air  Patient Activity (if Appropriate) In bed  Assess: MEWS Score  MEWS Temp 0  MEWS Systolic 0  MEWS Pulse 3  MEWS RR 0  MEWS LOC 0  MEWS Score 3  MEWS Score Color Yellow  Assess: if the MEWS score is Yellow or Red  Were vital signs accurate and taken at a resting state? Yes  Does the patient meet 2 or more of the SIRS criteria? No  Does the patient have a confirmed or suspected source of infection? Yes  MEWS guidelines implemented  Yes, yellow  Treat  MEWS Interventions Considered administering scheduled or prn medications/treatments as ordered  Take Vital Signs  Increase Vital Sign Frequency  Yellow: Q2hr x1, continue Q4hrs until patient remains green for 12hrs  Escalate  MEWS: Escalate Yellow: Discuss with charge nurse and consider notifying provider and/or RRT  Notify: Charge Nurse/RN  Name of Charge Nurse/RN Notified Dahlia Client, RN  Provider Notification  Provider Name/Title Ival Bible, MD  Date Provider Notified 06/21/23  Time Provider Notified 1312  Method of Notification Page  Notification Reason Change in status  Provider response See new orders  Date of Provider Response 06/21/23  Time of Provider Response 1300  Assess: SIRS CRITERIA  SIRS Temperature  0  SIRS Pulse 1  SIRS Respirations  0  SIRS WBC 0  SIRS Score Sum  1

## 2023-06-21 NOTE — Progress Notes (Signed)
PT Cancellation Note  Patient Details Name: Eric Zamora MRN: [id number] DOB: 04-03-1946   Cancelled Treatment:    Reason Eval/Treat Not Completed: Patient not medically ready PT orders received, chart reviewed. Pt noted to have elevated HR 131-140 bpm while asleep in bed. Will defer PT evaluation until HR more controlled.   Aleda Grana, PT, DPT 06/21/23, 2:54 PM   Sandi Mariscal 06/21/2023, 2:54 PM

## 2023-06-21 NOTE — Plan of Care (Signed)
  Problem: Education: Goal: Ability to describe self-care measures that may prevent or decrease complications (Diabetes Survival Skills Education) will improve Outcome: Progressing Goal: Individualized Educational Video(s) Outcome: Progressing   Problem: Coping: Goal: Ability to adjust to condition or change in health will improve Outcome: Progressing   Problem: Fluid Volume: Goal: Ability to maintain a balanced intake and output will improve Outcome: Progressing   Problem: Health Behavior/Discharge Planning: Goal: Ability to identify and utilize available resources and services will improve Outcome: Progressing Goal: Ability to manage health-related needs will improve Outcome: Progressing   Problem: Metabolic: Goal: Ability to maintain appropriate glucose levels will improve Outcome: Progressing   Problem: Nutritional: Goal: Maintenance of adequate nutrition will improve Outcome: Progressing Goal: Progress toward achieving an optimal weight will improve Outcome: Progressing   Problem: Skin Integrity: Goal: Risk for impaired skin integrity will decrease Outcome: Progressing   Problem: Tissue Perfusion: Goal: Adequacy of tissue perfusion will improve Outcome: Progressing   Problem: Education: Goal: Knowledge of General Education information will improve Description: Including pain rating scale, medication(s)/side effects and non-pharmacologic comfort measures Outcome: Progressing   Problem: Health Behavior/Discharge Planning: Goal: Ability to manage health-related needs will improve Outcome: Progressing   Problem: Clinical Measurements: Goal: Ability to maintain clinical measurements within normal limits will improve Outcome: Progressing Goal: Will remain free from infection Outcome: Progressing Goal: Diagnostic test results will improve Outcome: Progressing Goal: Respiratory complications will improve Outcome: Progressing Goal: Cardiovascular complication will  be avoided Outcome: Progressing   Problem: Activity: Goal: Risk for activity intolerance will decrease Outcome: Progressing   Problem: Nutrition: Goal: Adequate nutrition will be maintained Outcome: Progressing   Problem: Coping: Goal: Level of anxiety will decrease Outcome: Progressing   Problem: Elimination: Goal: Will not experience complications related to bowel motility Outcome: Progressing Goal: Will not experience complications related to urinary retention Outcome: Progressing   Problem: Pain Managment: Goal: General experience of comfort will improve Outcome: Progressing   Problem: Safety: Goal: Ability to remain free from injury will improve Outcome: Progressing   Problem: Skin Integrity: Goal: Risk for impaired skin integrity will decrease Outcome: Progressing   Problem: Fluid Volume: Goal: Hemodynamic stability will improve Outcome: Progressing   Problem: Clinical Measurements: Goal: Diagnostic test results will improve Outcome: Progressing Goal: Signs and symptoms of infection will decrease Outcome: Progressing   Problem: Respiratory: Goal: Ability to maintain adequate ventilation will improve Outcome: Progressing   Problem: Education: Goal: Ability to describe self-care measures that may prevent or decrease complications (Diabetes Survival Skills Education) will improve Outcome: Progressing Goal: Individualized Educational Video(s) Outcome: Progressing   Problem: Cardiac: Goal: Ability to maintain an adequate cardiac output will improve Outcome: Progressing   Problem: Health Behavior/Discharge Planning: Goal: Ability to identify and utilize available resources and services will improve Outcome: Progressing Goal: Ability to manage health-related needs will improve Outcome: Progressing   Problem: Fluid Volume: Goal: Ability to achieve a balanced intake and output will improve Outcome: Progressing   Problem: Metabolic: Goal: Ability to  maintain appropriate glucose levels will improve Outcome: Progressing   Problem: Nutritional: Goal: Maintenance of adequate nutrition will improve Outcome: Progressing Goal: Maintenance of adequate weight for body size and type will improve Outcome: Progressing   Problem: Respiratory: Goal: Will regain and/or maintain adequate ventilation Outcome: Progressing   Problem: Urinary Elimination: Goal: Ability to achieve and maintain adequate renal perfusion and functioning will improve Outcome: Progressing

## 2023-06-22 ENCOUNTER — Inpatient Hospital Stay (HOSPITAL_COMMUNITY): Payer: Medicare (Managed Care)

## 2023-06-22 DIAGNOSIS — A419 Sepsis, unspecified organism: Secondary | ICD-10-CM | POA: Diagnosis not present

## 2023-06-22 DIAGNOSIS — N179 Acute kidney failure, unspecified: Secondary | ICD-10-CM | POA: Diagnosis not present

## 2023-06-22 DIAGNOSIS — I4891 Unspecified atrial fibrillation: Secondary | ICD-10-CM | POA: Diagnosis not present

## 2023-06-22 DIAGNOSIS — I2510 Atherosclerotic heart disease of native coronary artery without angina pectoris: Secondary | ICD-10-CM | POA: Diagnosis not present

## 2023-06-22 LAB — COMPREHENSIVE METABOLIC PANEL
ALT: 127 U/L — ABNORMAL HIGH (ref 0–44)
AST: 87 U/L — ABNORMAL HIGH (ref 15–41)
Albumin: 2.5 g/dL — ABNORMAL LOW (ref 3.5–5.0)
Alkaline Phosphatase: 36 U/L — ABNORMAL LOW (ref 38–126)
Anion gap: 9 (ref 5–15)
BUN: 35 mg/dL — ABNORMAL HIGH (ref 8–23)
CO2: 22 mmol/L (ref 22–32)
Calcium: 8.3 mg/dL — ABNORMAL LOW (ref 8.9–10.3)
Chloride: 118 mmol/L — ABNORMAL HIGH (ref 98–111)
Creatinine, Ser: 1.84 mg/dL — ABNORMAL HIGH (ref 0.61–1.24)
GFR, Estimated: 37 mL/min — ABNORMAL LOW (ref >60)
Glucose, Bld: 146 mg/dL — ABNORMAL HIGH (ref 70–99)
Potassium: 3.5 mmol/L (ref 3.5–5.1)
Sodium: 149 mmol/L — ABNORMAL HIGH (ref 135–145)
Total Bilirubin: 0.6 mg/dL (ref 0.3–1.2)
Total Protein: 7.5 g/dL (ref 6.5–8.1)

## 2023-06-22 LAB — CBC
HCT: 40.4 % (ref 39.0–52.0)
Hemoglobin: 13.1 g/dL (ref 13.0–17.0)
MCH: 28.4 pg (ref 26.0–34.0)
MCHC: 32.4 g/dL (ref 30.0–36.0)
MCV: 87.4 fL (ref 80.0–100.0)
Platelets: 369 10*3/uL (ref 150–400)
RBC: 4.62 MIL/uL (ref 4.22–5.81)
RDW: 13.6 % (ref 11.5–15.5)
WBC: 15.6 10*3/uL — ABNORMAL HIGH (ref 4.0–10.5)
nRBC: 0.2 % (ref 0.0–0.2)

## 2023-06-22 LAB — CULTURE, BLOOD (ROUTINE X 2)
Culture: NO GROWTH
Culture: NO GROWTH
Special Requests: ADEQUATE
Special Requests: ADEQUATE

## 2023-06-22 LAB — GLUCOSE, CAPILLARY
Glucose-Capillary: 141 mg/dL — ABNORMAL HIGH (ref 70–99)
Glucose-Capillary: 151 mg/dL — ABNORMAL HIGH (ref 70–99)
Glucose-Capillary: 129 mg/dL — ABNORMAL HIGH (ref 70–99)
Glucose-Capillary: 261 mg/dL — ABNORMAL HIGH (ref 70–99)

## 2023-06-22 MED ORDER — METOPROLOL TARTRATE 50 MG PO TABS
100.0000 mg | ORAL_TABLET | Freq: Two times a day (BID) | ORAL | Status: DC
  Administered 2023-06-22 – 2023-06-23 (×2): 100 mg via ORAL
  Filled 2023-06-22 (×3): qty 2

## 2023-06-22 MED ORDER — METOPROLOL TARTRATE 25 MG PO TABS
25.0000 mg | ORAL_TABLET | Freq: Once | ORAL | Status: AC
  Administered 2023-06-22: 25 mg via ORAL

## 2023-06-22 NOTE — Plan of Care (Signed)
  Problem: Education: Goal: Ability to describe self-care measures that may prevent or decrease complications (Diabetes Survival Skills Education) will improve Outcome: Progressing Goal: Individualized Educational Video(s) Outcome: Progressing   Problem: Coping: Goal: Ability to adjust to condition or change in health will improve Outcome: Progressing   Problem: Fluid Volume: Goal: Ability to maintain a balanced intake and output will improve Outcome: Progressing   Problem: Health Behavior/Discharge Planning: Goal: Ability to identify and utilize available resources and services will improve Outcome: Progressing Goal: Ability to manage health-related needs will improve Outcome: Progressing   Problem: Metabolic: Goal: Ability to maintain appropriate glucose levels will improve Outcome: Progressing   Problem: Nutritional: Goal: Maintenance of adequate nutrition will improve Outcome: Progressing Goal: Progress toward achieving an optimal weight will improve Outcome: Progressing   Problem: Skin Integrity: Goal: Risk for impaired skin integrity will decrease Outcome: Progressing   Problem: Tissue Perfusion: Goal: Adequacy of tissue perfusion will improve Outcome: Progressing   Problem: Education: Goal: Knowledge of General Education information will improve Description: Including pain rating scale, medication(s)/side effects and non-pharmacologic comfort measures Outcome: Progressing   Problem: Health Behavior/Discharge Planning: Goal: Ability to manage health-related needs will improve Outcome: Progressing   Problem: Clinical Measurements: Goal: Ability to maintain clinical measurements within normal limits will improve Outcome: Progressing Goal: Will remain free from infection Outcome: Progressing Goal: Diagnostic test results will improve Outcome: Progressing Goal: Respiratory complications will improve Outcome: Progressing Goal: Cardiovascular complication will  be avoided Outcome: Progressing   Problem: Activity: Goal: Risk for activity intolerance will decrease Outcome: Progressing   Problem: Nutrition: Goal: Adequate nutrition will be maintained Outcome: Progressing   Problem: Coping: Goal: Level of anxiety will decrease Outcome: Progressing   Problem: Elimination: Goal: Will not experience complications related to bowel motility Outcome: Progressing Goal: Will not experience complications related to urinary retention Outcome: Progressing   Problem: Pain Managment: Goal: General experience of comfort will improve Outcome: Progressing   Problem: Safety: Goal: Ability to remain free from injury will improve Outcome: Progressing   Problem: Skin Integrity: Goal: Risk for impaired skin integrity will decrease Outcome: Progressing   Problem: Fluid Volume: Goal: Hemodynamic stability will improve Outcome: Progressing   Problem: Clinical Measurements: Goal: Diagnostic test results will improve Outcome: Progressing Goal: Signs and symptoms of infection will decrease Outcome: Progressing   Problem: Respiratory: Goal: Ability to maintain adequate ventilation will improve Outcome: Progressing   Problem: Education: Goal: Ability to describe self-care measures that may prevent or decrease complications (Diabetes Survival Skills Education) will improve Outcome: Progressing Goal: Individualized Educational Video(s) Outcome: Progressing   Problem: Cardiac: Goal: Ability to maintain an adequate cardiac output will improve Outcome: Progressing   Problem: Health Behavior/Discharge Planning: Goal: Ability to identify and utilize available resources and services will improve Outcome: Progressing Goal: Ability to manage health-related needs will improve Outcome: Progressing   Problem: Fluid Volume: Goal: Ability to achieve a balanced intake and output will improve Outcome: Progressing   Problem: Metabolic: Goal: Ability to  maintain appropriate glucose levels will improve Outcome: Progressing   Problem: Nutritional: Goal: Maintenance of adequate nutrition will improve Outcome: Progressing Goal: Maintenance of adequate weight for body size and type will improve Outcome: Progressing   Problem: Respiratory: Goal: Will regain and/or maintain adequate ventilation Outcome: Progressing   Problem: Urinary Elimination: Goal: Ability to achieve and maintain adequate renal perfusion and functioning will improve Outcome: Progressing

## 2023-06-22 NOTE — Progress Notes (Signed)
   06/22/23 1024  Assess: MEWS Score  Temp 99.2 F (37.3 C)  BP 123/78  MAP (mmHg) 93  Pulse Rate (!) 143  ECG Heart Rate (!) 132  Resp (!) 27  SpO2 96 %  O2 Device Room Air  Patient Activity (if Appropriate) In bed  Assess: MEWS Score  MEWS Temp 0  MEWS Systolic 0  MEWS Pulse 3  MEWS RR 2  MEWS LOC 0  MEWS Score 5  MEWS Score Color Red  Assess: if the MEWS score is Yellow or Red  Were vital signs accurate and taken at a resting state? Yes  Does the patient meet 2 or more of the SIRS criteria? No  Does the patient have a confirmed or suspected source of infection? Yes  MEWS guidelines implemented  No, previously yellow, continue vital signs every 4 hours  Assess: SIRS CRITERIA  SIRS Temperature  0  SIRS Pulse 1  SIRS Respirations  1  SIRS WBC 1  SIRS Score Sum  3

## 2023-06-22 NOTE — Evaluation (Signed)
Physical Therapy Evaluation Patient Details Name: Eric Zamora MRN: [id number] DOB: 13-May-1946 Today's Date: 06/22/2023  History of Present Illness  Eric Zamora is a 77 y.o. male s/p I&D perirectal abscess 10/18, admitted with severe sepsis due to perirectal abscess. PMH: bipolar disorder, dementia, history of NSTEMI, hypertrophic cardiomyopathy  Clinical Impression  Pt admitted with above diagnosis. Per chart review, pt from [facility] and needing increased assistance with mobility and ADLs. Pt limited by HR and lethargy on eval. Pt able to perform SLR, heel slide, ankle pump while supine in the bed; noted to be soiled with BM upon linen removal. Pt needing mod A+2 for rolling R/L to change linen, total A for pericare. Pt able to follow commands with increased time and cues, denies pain, though faical wincing noted. Pt states "yes ma'am" or garbled speech during evaluation. Patient will benefit from continued inpatient follow up therapy, <3 hours/day. Pt currently with functional limitations due to the deficits listed below (see PT Problem List). Pt will benefit from acute skilled PT to increase their independence and safety with mobility to allow discharge.           If plan is discharge home, recommend the following: Two people to help with walking and/or transfers;Two people to help with bathing/dressing/bathroom;Assistance with feeding;Assistance with cooking/housework   Can travel by private vehicle   No    Equipment Recommendations None recommended by PT  Recommendations for Other Services       Functional Status Assessment       Precautions / Restrictions Precautions Precautions: Fall Precaution Comments: penrose dressing, monitor HR Restrictions Weight Bearing Restrictions: No      Mobility  Bed Mobility Overal bed mobility: Needs Assistance Bed Mobility: Rolling Rolling: Mod assist, +2 for physical assistance         General bed mobility comments:  pt able to perform SLR and place LE into hooklying position, guided UE to bedrail and cued pt on rolling to R and L for pericare/linen change, pt ultimately needing mod A+2 to fully roll into sidelying, HR up to 118 with rolling    Transfers                        Ambulation/Gait                  Stairs            Wheelchair Mobility     Tilt Bed    Modified Rankin (Stroke Patients Only)       Balance                                             Pertinent Vitals/Pain Pain Assessment Pain Assessment: No/denies pain Pain Location: Pt denies pain but noted facial wincing with rolling and pericare    Home Living Family/patient expects to be discharged to:: Skilled nursing facility                   Additional Comments: Pt states "yes ma'am" to all questions, per chart review from [facility], pt needing increased assistance with mobility and ADLs    Prior Function Prior Level of Function : Patient poor historian/Family not available             Mobility Comments: per chart review from [facility],  pt needing increased assistance with mobility and ADLs ADLs Comments: per chart review from [facility], pt needing increased assistance with mobility and ADLs     Extremity/Trunk Assessment   Upper Extremity Assessment Upper Extremity Assessment: Generalized weakness    Lower Extremity Assessment Lower Extremity Assessment: Generalized weakness       Communication   Communication Communication: Difficulty communicating thoughts/reduced clarity of speech  Cognition Arousal: Lethargic Behavior During Therapy: Flat affect Overall Cognitive Status: No family/caregiver present to determine baseline cognitive functioning                                 General Comments: pt follows 1 step commands with increased time and cues, states "yes ma'am" to most  questions or unintelligble speech        General Comments      Exercises     Assessment/Plan    PT Assessment Patient needs continued PT services  PT Problem List Decreased strength;Decreased range of motion;Decreased activity tolerance;Decreased balance;Decreased mobility;Decreased cognition;Decreased knowledge of use of DME;Decreased safety awareness       PT Treatment Interventions DME instruction;Gait training;Functional mobility training;Therapeutic activities;Therapeutic exercise;Balance training;Patient/family education    PT Goals (Current goals can be found in the Care Plan section)  Acute Rehab PT Goals Patient Stated Goal: none stated PT Goal Formulation: Patient unable to participate in goal setting Time For Goal Achievement: 07/06/23 Potential to Achieve Goals: Fair    Frequency Min 1X/week     Co-evaluation               AM-PAC PT "6 Clicks" Mobility  Outcome Measure Help needed turning from your back to your side while in a flat bed without using bedrails?: A Lot Help needed moving from lying on your back to sitting on the side of a flat bed without using bedrails?: Total Help needed moving to and from a bed to a chair (including a wheelchair)?: Total Help needed standing up from a chair using your arms (e.g., wheelchair or bedside chair)?: Total Help needed to walk in hospital room?: Total Help needed climbing 3-5 steps with a railing? : Total 6 Click Score: 7    End of Session   Activity Tolerance: Patient limited by lethargy;Treatment limited secondary to medical complications (Comment) (HR) Patient left: in bed;with call bell/phone within reach;with nursing/sitter in room Nurse Communication: Mobility status;Other (comment) (bowel movement) PT Visit Diagnosis: Other abnormalities of gait and mobility (R26.89);Muscle weakness (generalized) (M62.81)    Time: 6962-9528 PT Time Calculation (min) (ACUTE ONLY): 33 min   Charges:   PT  Evaluation $PT Eval Moderate Complexity: 1 Mod PT Treatments $Therapeutic Activity: 8-22 mins PT General Charges $$ ACUTE PT VISIT: 1 Visit         Tori Caroleena Paolini PT, DPT 06/22/23, 1:58 PM

## 2023-06-22 NOTE — [place] FL2 (Signed)
[place] MEDICAID FL2 LEVEL OF CARE FORM     IDENTIFICATION  Patient Name: Eric Zamora Birthdate: 06-21-1946 Sex: male Admission Date (Current Location): 06/15/2023  [facility] and [place] Number:  Producer, television/film/video and Address:  [facility],  [address] [place], [place] [address]      Provider Number: [id number]  Attending Physician Name and Address:  Alberteen Sam, *  Relative Name and Phone Number:  Legal Benjamine Mola [phone]    Current Level of Care: Hospital Recommended Level of Care: Skilled Nursing Facility Prior Approval Number:    Date Approved/Denied:   PASRR Number: [id number]  Discharge Plan: SNF    Current Diagnoses: Patient Active Problem List   Diagnosis Date Noted   Transaminitis 06/18/2023   Dysphagia 06/18/2023   Diabetic ketoacidosis without coma associated with type 2 diabetes mellitus (HCC) 06/17/2023   Acute renal failure superimposed on stage 3b chronic kidney disease (HCC) 06/16/2023   Mixed hyperlipidemia 06/16/2023   Atrial fibrillation with rapid ventricular response (HCC) 06/16/2023   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 07/02/2013   Essential hypertension 07/02/2013   Bipolar disorder (manic depression) (HCC) 07/02/2013   Severe sepsis due to perirectal abscess(HCC) 07/02/2013    Orientation  RESPIRATION BLADDER Height & Weight     Self, Place  Normal Incontinent Weight: 176 lb 9.4 oz (80.1 kg) Height:  5\' 5"  (165.1 cm)  BEHAVIORAL SYMPTOMS/MOOD NEUROLOGICAL BOWEL NUTRITION STATUS      Continent Diet (See discharge summary)  AMBULATORY STATUS COMMUNICATION OF NEEDS Skin   Extensive Assist Verbally Normal                       Personal Care Assistance Level of Assistance  Bathing, Feeding, Dressing Bathing Assistance: Maximum assistance Feeding assistance: Limited assistance Dressing Assistance: Maximum assistance     Functional Limitations Info  Sight, Hearing, Speech Sight Info: Impaired Hearing Info: Adequate Speech Info: Adequate    SPECIAL CARE FACTORS FREQUENCY  PT (By licensed PT), OT (By licensed OT)     PT Frequency: 5x/wk OT Frequency: 5x/wk            Contractures Contractures Info: Not present    Additional Factors Info  Code Status, Allergies, Psychotropic Code Status Info: FULL Allergies Info: No Known Allergies Psychotropic Info: See MAR         Current Medications (06/22/2023):  This is the current hospital active medication list Current Facility-Administered Medications  Medication Dose Route Frequency Provider Last Rate Last Admin   acetaminophen (TYLENOL) tablet 650 mg  650 mg Oral Q6H PRN Maczis, Elmer Sow, PA-C       albuterol (PROVENTIL) (2.5 MG/3ML) 0.083% nebulizer solution 2.5 mg  2.5 mg Nebulization Q2H PRN [name], PA-C   2.5 mg at 06/17/23 0938   amoxicillin-clavulanate (AUGMENTIN) 875-125 MG per tablet 1 tablet  1 tablet Oral Q12H Danford, Earl Lites, MD   1  tablet at 06/21/23 2228   apixaban (ELIQUIS) tablet 5 mg  5 mg Oral BID Alberteen Sam, MD   5 mg at 06/21/23 2228   dextrose 50 % solution 0-50 mL  0-50 mL Intravenous PRN [name], PA-C   25 mL at 06/18/23 4098   doxycycline (VIBRA-TABS) tablet 100 mg  100 mg Oral Q12H Danford, Earl Lites, MD   100 mg at 06/21/23 2228   feeding  supplement (ENSURE ENLIVE / ENSURE PLUS) liquid 237 mL  237 mL Oral BID BM Danford, Earl Lites, MD       insulin aspart (novoLOG) injection 0-15 Units  0-15 Units Subcutaneous TID WC Alberteen Sam, MD   3 Units at 06/22/23 1316   insulin aspart (novoLOG) injection 0-5 Units  0-5 Units Subcutaneous QHS Alberteen Sam, MD   5 Units at 06/21/23 2228   insulin glargine-yfgn [facility]) injection 20 Units  20 Units Subcutaneous Q24H [name], PA-C   20 Units at 06/21/23 1805   metoprolol tartrate (LOPRESSOR) tablet 100 mg  100 mg Oral BID Alberteen Sam, MD       ondansetron [facility]) tablet 4 mg  4 mg Oral Q6H PRN [name], PA-C       Or   ondansetron [facility]) injection 4 mg  4 mg Intravenous Q6H PRN [name], PA-C   4 mg at 06/18/23 1123   Oral care mouth rinse  15 mL Mouth Rinse PRN [name], PA-C   15 mL at 06/16/23 1191   Oral care mouth rinse  15 mL Mouth Rinse 4 times per day [name], PA-C   15 mL at 06/21/23 2229   Oral care mouth rinse  15 mL Mouth Rinse PRN Maczis, Elmer Sow, PA-C       oxyCODONE (Oxy IR/ROXICODONE) immediate release tablet 2.5-5 mg  2.5-5 mg Oral Q4H PRN [name], PA-C       sodium chloride flush (NS) 0.9 % injection 3 mL  3 mL Intravenous Q12H [name], PA-C   3 mL at 06/21/23 2228   traZODone (DESYREL) tablet 25 mg  25 mg Oral QHS PRN [name], PA-C         Discharge Medications: Please see discharge summary for a list of discharge medications.  Relevant Imaging Results:  Relevant Lab Results:   Additional Information SSN: [id number]  Otelia Santee, LCSW

## 2023-06-22 NOTE — Progress Notes (Signed)
MEWS Progress Note  Patient Details Name: Eric Zamora MRN: [id number] DOB: 07-16-1946 Today's Date: 06/22/2023   MEWS Flowsheet Documentation:  Assess: MEWS Score Temp: 99.9 F (37.7 C) BP: 137/67 MAP (mmHg): 89 Pulse Rate: (!) 103 ECG Heart Rate: (!) 132 Resp: (!) 27 Level of Consciousness: Alert SpO2: 99 % O2 Device: Room Air Patient Activity (if Appropriate): In bed O2 Flow Rate (L/min): 4 L/min FiO2 (%): 30 % Assess: MEWS Score MEWS Temp: 0 MEWS Systolic: 0 MEWS Pulse: 1 MEWS RR: 0 MEWS LOC: 0 MEWS Score: 1 MEWS Score Color: Green Assess: SIRS CRITERIA SIRS Temperature : 0 SIRS Respirations : 0 SIRS Pulse: 1 SIRS WBC: 0 SIRS Score Sum : 1 SIRS Temperature : 0 SIRS Pulse: 1 SIRS Respirations : 1 SIRS WBC: 1 SIRS Score Sum : 3 Assess: if the MEWS score is Yellow or Red Were vital signs accurate and taken at a resting state?: Yes Does the patient meet 2 or more of the SIRS criteria?: No Does the patient have a confirmed or suspected source of infection?: Yes MEWS guidelines implemented : No, previously yellow, continue vital signs every 4 hours Treat MEWS Interventions: Considered administering scheduled or prn medications/treatments as ordered Take Vital Signs Increase Vital Sign Frequency : Yellow: Q2hr x1, continue Q4hrs until patient remains green for 12hrs Escalate MEWS: Escalate: Yellow: Discuss with charge nurse and consider notifying provider and/or RRT        Laurene Footman Kathyrn Warmuth 06/22/2023, 2:29 PM

## 2023-06-22 NOTE — Progress Notes (Signed)
Progress Note   Patient: Eric Zamora ZOX:[id number] DOB: [birth year] DOA: 06/15/2023     7 DOS: the patient was seen and examined on 06/22/2023 at 11:12AM and 6:10PM      Brief hospital course: Eric Zamora is a 77 y.o. 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    Significant events: 10/15: Sent from SNF for tachpynea, found to have fever, no localizing source, admitted on antibiotics 10/17: Overnight with fever, aspiration event, new AG acidosis, transferred back to SDU for BiPAP and insulin drip 10/18: CT shows perirectal abscess, general surgery consulted; taken to OR for drainage of abscess 10/19: SLP advanced to Dys 1 diet, honey thick 10/20 to 10/22: Titrating up metoprolol for atrial fibrillation    Significant studies: 10/15 CXR: no acute disease 10/15 CT head: NAICP 10/16 Echo: HOCM, normal EF, normal valves 10/17 CXR: new infiltrates c/w aspiration 10/18 CT chest abdomen and pelvis: 6cm perirectal abscess 10/19 MBS: Limited by cognition only   Significant microbiology data: 10/15 Blood cx x2: NGTD 10/16 COVID: Neg 10/16 RVP: Neg 10/17 Repeat blood cx: NGTD           Assessment and Plan: * Severe sepsis due to perirectal abscess [facility]) S/p rectal abscess I&D by Dr. Derrell Lolling P/w fever, leukocytosis, renal failure, reported encephalopathy and hypotension >40 mmHg drop from baseline.    UTI and pneumonia ruled out.  CT chest abdomen and pelvis showed a perirectal abscess that was not visible on clinical exam.  General surgery consulted and drained abscess. - Continue Augmentin and doxycycline for 14 days total - Penrose drain in place - Consult general surgery for source control, appreciate cares       Atrial fibrillation with rapid ventricular response (HCC) New onset.  CHA2DS2-Vasc 6  (age, HTN, CHF, DM, vascular disease).  TSH normal.  Echo shows HOCM (previously known).  Normal valves.  Rates have not been controlled - Continue new apixaban - Continue metoprolol, titrate up dose - Stop Plavix given now on anticoagulation  Transaminitis I am uncertain if this is from the perirectal abscess, but I suspect.  CT and US imaging of biliary tree are normal.  He has no RUQ localizing symptoms.   - Trend LFTs  Dysphagia Aspirated with speech therapy 10/17.  MBS showed aspiration.  Doing well now with pureed diet. - Consult SLP  Dementia Acute metabolic encephalopathy At baseline, patient is ambulatory without assistance, participates in cares, and is interactive, but has severe short term memory loss.  Here, he is sluggish, poorly responsive.  Suspect this is encephalopathy from infection, although it is slow to improve. - Standard delirium precautions - Hold Keppra and sedating medicines  Acute renal failure superimposed on stage 3b chronic kidney disease (HCC) Cr 1.99 on admission from baseline ~1.3.  Improved with fluids. 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  Treated with IV Lasix, now Na trending up.   - Hold furosemide for now  Hypernatremia Na trending up today. - Hold Lasix - Trend Na   CAD- Moderate LAD disease with moderate to severe ostial first diagonal disease by cath 07/2013 - not ammendable to PCI. Medical therapy Mixed hyperlipidemia - Continue metoprolol - Resume simvastatin at discharge - Stop Plavix and aspirin given  new start Eliquis  Essential hypertension BP elevated  - Continue metoprolol - Hold furosemide  Bipolar disorder (manic depression) (HCC) Not on meds for this at home, no active symptoms  On antiepileptic therapy Patient not reliable historian.  Unclear reason for this med. Not listed in med list at 2022 Neuro appointment.   Maybe this is offlabel treatment for Bipolar d/o? - Resume Keppra when mentation improves   Uncontrolled type 2 diabetes mellitus with hyperglycemia, without long-term current use of insulin (HCC) No prior records, not on antiglycemic medications at home.  A1c 9.4% - Continue new glargine - Continue SS corrections  Diabetic ketoacidosis without coma associated with type 2 diabetes mellitus (HCC) This developed overnight 10/17.  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  No symptoms of chest pain, syncope reported.  At the moment I do not suspect CHF. - Continue metoprolol           Subjective: Patient volunteers nothing.  Nursing of no concerns.  Patient eats all of his food, but has to be fed.  He does not participate in self-cares at all.  He has had no fever although his temp is 46F.  He said no respiratory symptoms     Physical Exam: BP 137/67 (BP Location: Left Arm)   Pulse (!) 103   Temp 99.9 F (37.7 C) (Oral)   Resp (!) 27   Ht 5\' 5"  (1.651 m)   Wt 80.1 kg   SpO2 99%   BMI 29.39 kg/m   Overweight adult male, lying in bed, answers some questions, then closes his eyes again Heart rate irregular, tachycardic, no murmurs, pitting edema is resolved Respiratory rate normal, lungs diminished, no rales or wheezes appreciated Abdomen distended but no tenderness, no guarding, no rebound, no ascites Attention diminished, affect blunted, poorly cooperative with exam, oriented to self only, speech dysarthric but at baseline, does not cooperate exam unable to see if he has focal weakness    Data Reviewed: Basic metabolic panel shows sodium up to 149, creatinine up to 1.8 Her blood cell count up to 15 AST and ALT no  change     Disposition: Status is: Inpatient The patient was admitted with sepsis and new A-fib  Source of sepsis with perirectal abscess which has been drained  However the patient still has persistent encephalopathy.  At baseline he walks with no assistive devices, eats a regular chopped diet, feeds self, and is verbal with only mild slur.  Here he is much more debilitated, unable to sit up, unable to feed himself, and will need acute rehab once electrolytes and renal function stabilized.        Author: Alberteen Sam, MD 06/22/2023 7:43 PM  For on call review [email or web address].

## 2023-06-22 NOTE — TOC Progression Note (Addendum)
Transition of Care [facility]) - Progression Note    Patient Details  Name: Eric Zamora MRN: [id number] Date of Birth: [birth year]  Transition of Care [facility]) CM/SW Contact  Otelia Santee, LCSW Phone Number: 06/22/2023, 2:09 PM  Clinical Narrative:    Pt recommended for SNF placement.  CSW left voicemail for pt's legal guardian through DSS, [name] [phone]) to discuss recommendation.  Pt has been worked up for SNF and currently awaiting bed offers.  Barriers to SNF include pt's payor source.   ADDENDUM: Spoke with pt's guardian who is agreeable to SNF placement for this pt.    Expected Discharge Plan: Skilled Nursing Facility Barriers to Discharge: Continued Medical Work up  Expected Discharge Plan and Services     Post Acute Care Choice: Skilled Nursing Facility Living arrangements for the past 2 months: Assisted Living Facility [facility])                                       Social Determinants of Health (SDOH) Interventions SDOH Screenings   Food Insecurity: Patient Unable To Answer (06/15/2023)  Housing: Low Risk  (06/15/2023)  Transportation Needs: Patient Unable To Answer (06/15/2023)  Utilities: Patient Unable To Answer (06/15/2023)  Tobacco Use: Low Risk  (06/18/2023)    Readmission Risk Interventions     No data to display

## 2023-06-23 DIAGNOSIS — I421 Obstructive hypertrophic cardiomyopathy: Secondary | ICD-10-CM | POA: Diagnosis not present

## 2023-06-23 DIAGNOSIS — D6869 Other thrombophilia: Secondary | ICD-10-CM

## 2023-06-23 DIAGNOSIS — N170 Acute kidney failure with tubular necrosis: Secondary | ICD-10-CM | POA: Diagnosis not present

## 2023-06-23 DIAGNOSIS — A419 Sepsis, unspecified organism: Secondary | ICD-10-CM | POA: Diagnosis not present

## 2023-06-23 DIAGNOSIS — R4182 Altered mental status, unspecified: Secondary | ICD-10-CM | POA: Diagnosis not present

## 2023-06-23 DIAGNOSIS — I2510 Atherosclerotic heart disease of native coronary artery without angina pectoris: Secondary | ICD-10-CM | POA: Diagnosis not present

## 2023-06-23 DIAGNOSIS — I4819 Other persistent atrial fibrillation: Secondary | ICD-10-CM

## 2023-06-23 DIAGNOSIS — E1110 Type 2 diabetes mellitus with ketoacidosis without coma: Secondary | ICD-10-CM | POA: Diagnosis not present

## 2023-06-23 LAB — CBC
HCT: 42.9 % (ref 39.0–52.0)
Hemoglobin: 14 g/dL (ref 13.0–17.0)
MCH: 29.3 pg (ref 26.0–34.0)
MCHC: 32.6 g/dL (ref 30.0–36.0)
MCV: 89.7 fL (ref 80.0–100.0)
Platelets: 364 10*3/uL (ref 150–400)
RBC: 4.78 MIL/uL (ref 4.22–5.81)
RDW: 14.1 % (ref 11.5–15.5)
WBC: 15.6 10*3/uL — ABNORMAL HIGH (ref 4.0–10.5)
nRBC: 0.1 % (ref 0.0–0.2)

## 2023-06-23 LAB — GLUCOSE, CAPILLARY
Glucose-Capillary: 159 mg/dL — ABNORMAL HIGH (ref 70–99)
Glucose-Capillary: 301 mg/dL — ABNORMAL HIGH (ref 70–99)
Glucose-Capillary: 174 mg/dL — ABNORMAL HIGH (ref 70–99)
Glucose-Capillary: 137 mg/dL — ABNORMAL HIGH (ref 70–99)

## 2023-06-23 LAB — COMPREHENSIVE METABOLIC PANEL
ALT: 102 U/L — ABNORMAL HIGH (ref 0–44)
AST: 69 U/L — ABNORMAL HIGH (ref 15–41)
Albumin: 2.6 g/dL — ABNORMAL LOW (ref 3.5–5.0)
Alkaline Phosphatase: 34 U/L — ABNORMAL LOW (ref 38–126)
Anion gap: 8 (ref 5–15)
BUN: 41 mg/dL — ABNORMAL HIGH (ref 8–23)
CO2: 20 mmol/L — ABNORMAL LOW (ref 22–32)
Calcium: 8.7 mg/dL — ABNORMAL LOW (ref 8.9–10.3)
Chloride: 123 mmol/L — ABNORMAL HIGH (ref 98–111)
Creatinine, Ser: 1.99 mg/dL — ABNORMAL HIGH (ref 0.61–1.24)
GFR, Estimated: 34 mL/min — ABNORMAL LOW (ref >60)
Glucose, Bld: 162 mg/dL — ABNORMAL HIGH (ref 70–99)
Potassium: 3.8 mmol/L (ref 3.5–5.1)
Sodium: 151 mmol/L — ABNORMAL HIGH (ref 135–145)
Total Bilirubin: 0.7 mg/dL (ref 0.3–1.2)
Total Protein: 7.7 g/dL (ref 6.5–8.1)

## 2023-06-23 MED ORDER — DILTIAZEM HCL 25 MG/5ML IV SOLN
10.0000 mg | Freq: Once | INTRAVENOUS | Status: AC
  Administered 2023-06-23: 10 mg via INTRAVENOUS
  Filled 2023-06-23: qty 5

## 2023-06-23 MED ORDER — INSULIN ASPART 100 UNIT/ML IJ SOLN
3.0000 [IU] | Freq: Three times a day (TID) | INTRAMUSCULAR | Status: DC
  Administered 2023-06-23 – 2023-06-29 (×12): 3 [IU] via SUBCUTANEOUS

## 2023-06-23 MED ORDER — INSULIN GLARGINE-YFGN 100 UNIT/ML ~~LOC~~ SOLN
22.0000 [IU] | SUBCUTANEOUS | Status: DC
  Administered 2023-06-23 – 2023-06-27 (×5): 22 [IU] via SUBCUTANEOUS
  Filled 2023-06-23 (×6): qty 0.22

## 2023-06-23 MED ORDER — METOPROLOL TARTRATE 50 MG PO TABS
100.0000 mg | ORAL_TABLET | Freq: Three times a day (TID) | ORAL | Status: DC
  Administered 2023-06-23 – 2023-06-26 (×10): 100 mg via ORAL
  Filled 2023-06-23 (×12): qty 2

## 2023-06-23 MED ORDER — DILTIAZEM HCL 30 MG PO TABS
30.0000 mg | ORAL_TABLET | Freq: Three times a day (TID) | ORAL | Status: DC
  Administered 2023-06-23: 30 mg via ORAL
  Filled 2023-06-23: qty 1

## 2023-06-23 MED ORDER — DEXTROSE 5 % IV SOLN: INTRAVENOUS | Status: AC

## 2023-06-23 NOTE — Progress Notes (Signed)
Patient continues with fever, cool rags placed and blanket removed, will continue to monitor.

## 2023-06-23 NOTE — Progress Notes (Signed)
[facility]                                                                              [name], is a 77 y.o. male, DOB - 07-Aug-1946, WUJ:[id number] Admit date - 06/15/2023    Outpatient Primary MD for the patient is Kurth-Bowen, Cornelia, PA-C  LOS - 8  days  Chief Complaint  Patient presents with   Altered Mental Status       Brief summary   Mr. Eric Zamora is a 77 y.o. 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  Significant events: 10/15: Sent from SNF for tachpynea, found to have fever, no localizing source, admitted on antibiotics 10/17: Overnight with fever, aspiration event, new AG acidosis, transferred back to SDU for BiPAP and insulin drip 10/18: CT shows perirectal abscess, general surgery consulted; taken to OR for drainage of abscess 10/19: SLP advanced to Dys 1 diet, honey thick 10/20 to 10/22: Titrating up metoprolol for atrial fibrillation  Assessment & Plan    Principal Problem:   Severe sepsis due to perirectal abscess(HCC) -Presented with fever, leukocytosis, AKI, reported encephalopathy and hypotension.  UTI, pneumonia ruled out. -CT chest abdomen pelvis showed perirectal abscess  -General Surgery was consulted, status post I&D by Dr. Derrell Lolling on 06/18/23 -Continue Augmentin, doxycycline for 14 days - Penrose drain removed, surgery following, needs local wound care, sitz bath   Active problems Atrial fibrillation with RVR, new onset -New onset.  CHA2DS2-Vasc 6 (age, HTN, CHF, DM, vascular disease).  TSH normal.  Echo shows HOCM (previously known).  Normal valves. -Rate still not controlled, in 140s today, continue metoprolol, give Cardizem 10 mg IV x 1 -Continue apixaban, Plavix discontinued as now on anticoagulation -Cardiology consulted   Transaminitis -Possibly due  to shock liver, from severe sepsis, hypotension.  LFTs improving - CT and US imaging of biliary tree are normal, no RUQ localizing symptoms.     Dysphagia Aspirated with speech therapy 10/17.  MBS showed aspiration.   -SLP consulted, continue dysphagia 1 diet    Dementia Acute metabolic encephalopathy At baseline, patient is ambulatory without assistance, participates in cares, and is interactive, but has severe short term memory loss.  Suspect encephalopathy from infection, although it is slow to improve. - Standard delirium precautions - Hold Keppra and sedating medicines, slowly improving   Acute renal failure superimposed on stage 3b chronic kidney disease (HCC) Cr 1.99 on admission from baseline ~1.3.  Improved with fluids. 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  Treated with IV Lasix, now Na trending up.   - Hold furosemide for now   Hypernatremia -Sodium continues to trend up, 151 with known anion gap metabolic acidosis -Lasix on hold, placed on D5 water at 75 cc an hour   CAD- Moderate LAD disease with moderate to severe ostial first diagonal disease by cath 07/2013 - not  ammendable to PCI. Medical therapy Mixed hyperlipidemia - Continue metoprolol - Simvastatin on hold due to transaminitis - Aspirin, Plavix discontinued as patient is now on anticoagulation with Eliquis    Essential hypertension -Continue beta-blocker, received Cardizem 10 mg IV x 1 this morning due to A-fib with RVR   Bipolar disorder (manic depression) (HCC) Not on meds for this at home, no active symptoms   On antiepileptic therapy Patient not reliable historian.  Unclear reason for this med. Not listed in med list at 2022 Neuro appointment.  Maybe this is offlabel treatment for Bipolar d/o? - Resume Keppra when mentation improves    Uncontrolled type 2 diabetes  mellitus with hyperglycemia, without long-term current use of insulin (HCC) No prior records, not on antiglycemic medications at home.  A1c 9.4%  CBG (last 3)  Recent Labs    06/22/23 2040 06/23/23 0819 06/23/23 1128  GLUCAP 261* 159* 301*   -CBGs expected to rise with D5 - Patient started on Semglee this admission, increase to 22 units daily, added NovoLog meal coverage 3 units 3 times daily AC, continue correction factor moderate   Diabetic ketoacidosis without coma associated with type 2 diabetes mellitus (HCC) This developed overnight 10/17.  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  No symptoms of chest pain, syncope reported.  At the moment I do not suspect CHF. - Continue metoprolol    Estimated body mass index is 29.39 kg/m as calculated from the following:   Height as of this encounter: 5\' 5"  (1.651 m).   Weight as of this encounter: 80.1 kg.  Code Status: full  DVT Prophylaxis:  SCDs Start: 06/15/23 1459 apixaban (ELIQUIS) tablet 5 mg   Level of Care: Level of care: Telemetry Family Communication: Updated patient Disposition Plan:      Remains inpatient appropriate:      Procedures:    Consultants:   Cardiology General Surgery  Antimicrobials:   Anti-infectives (From admission, onward)    Start     Dose/Rate Route Frequency Ordered Stop   06/21/23 1215  doxycycline (VIBRA-TABS) tablet 100 mg        100 mg Oral Every 12 hours 06/21/23 1128 07/05/23 0959   06/21/23 1215  amoxicillin-clavulanate (AUGMENTIN) 875-125 MG per tablet 1 tablet        1 tablet Oral Every 12 hours 06/21/23 1128 07/05/23 0959   06/20/23 1200  vancomycin (VANCOREADY) IVPB 1500 mg/300 mL  Status:  Discontinued        1,500 mg 150 mL/hr over 120 Minutes Intravenous Every 36 hours 06/20/23 0719 06/21/23 1128   06/19/23 0000   vancomycin (VANCOREADY) IVPB 1250 mg/250 mL  Status:  Discontinued        1,250 mg 166.7 mL/hr over 90 Minutes Intravenous Every 36 hours 06/18/23 0939 06/20/23 0719   06/18/23 1800  ceFEPIme (MAXIPIME) 2 g in sodium chloride 0.9 % 100 mL IVPB  Status:  Discontinued        2 g 200 mL/hr over 30 Minutes Intravenous Every 12 hours 06/18/23 0939 06/21/23 1128   06/18/23 0600  ceFEPIme (MAXIPIME) 2 g in sodium chloride 0.9 % 100 mL IVPB  Status:  Discontinued        2 g 200 mL/hr over 30 Minutes Intravenous Every 24 hours 06/17/23 0920 06/18/23 0939   06/17/23 1400  vancomycin (VANCOCIN)  IVPB 1000 mg/200 mL premix  Status:  Discontinued        1,000 mg 200 mL/hr over 60 Minutes Intravenous Every 36 hours 06/17/23 0920 06/18/23 0939   06/16/23 1800  vancomycin (VANCOREADY) IVPB 750 mg/150 mL  Status:  Discontinued        750 mg 150 mL/hr over 60 Minutes Intravenous Every 24 hours 06/15/23 1639 06/17/23 0920   06/16/23 1100  cefTRIAXone (ROCEPHIN) 2 g in sodium chloride 0.9 % 100 mL IVPB  Status:  Discontinued        2 g 200 mL/hr over 30 Minutes Intravenous Every 24 hours 06/15/23 1453 06/15/23 1455   06/15/23 1800  ceFEPIme (MAXIPIME) 2 g in sodium chloride 0.9 % 100 mL IVPB  Status:  Discontinued        2 g 200 mL/hr over 30 Minutes Intravenous Every 12 hours 06/15/23 1558 06/17/23 0920   06/15/23 1700  metroNIDAZOLE (FLAGYL) IVPB 500 mg  Status:  Discontinued        500 mg 100 mL/hr over 60 Minutes Intravenous Every 12 hours 06/15/23 1455 06/21/23 1128   06/15/23 1630  vancomycin (VANCOREADY) IVPB 1500 mg/300 mL        1,500 mg 150 mL/hr over 120 Minutes Intravenous  Once 06/15/23 1558 06/15/23 2058   06/15/23 1045  cefTRIAXone (ROCEPHIN) 2 g in sodium chloride 0.9 % 100 mL IVPB        2 g 200 mL/hr over 30 Minutes Intravenous  Once 06/15/23 1041 06/15/23 1155          Medications  amoxicillin-clavulanate  1 tablet Oral Q12H   apixaban  5 mg Oral BID   diltiazem  30 mg Oral Q8H    doxycycline  100 mg Oral Q12H   feeding supplement  237 mL Oral BID BM   insulin aspart  0-15 Units Subcutaneous TID WC   insulin aspart  0-5 Units Subcutaneous QHS   insulin glargine-yfgn  20 Units Subcutaneous Q24H   metoprolol tartrate  100 mg Oral BID   mouth rinse  15 mL Mouth Rinse 4 times per day   sodium chloride flush  3 mL Intravenous Q12H      Subjective:   Eric Zamora was seen and examined today.  Heart rate uncontrolled, in atrial fibrillation with RVR, HR in 140s.  No acute chest pain, shortness of breath, nausea or vomiting.   Over night spiked fevers 101.1 F. Objective:   Vitals:   06/23/23 0330 06/23/23 0425 06/23/23 0835 06/23/23 1135  BP: (!) 127/91  117/81 96/67  Pulse: (!) 121  (!) 146 99  Resp: (!) 28     Temp: (!) 101.1 F (38.4 C) 98.5 F (36.9 C) 98.9 F (37.2 C) 99.5 F (37.5 C)  TempSrc:   Oral   SpO2:   99% 100%  Weight:      Height:        Intake/Output Summary (Last 24 hours) at 06/23/2023 1317 Last data filed at 06/23/2023 0900 Gross per 24 hour  Intake 118 ml  Output 1150 ml  Net -1032 ml     Wt Readings from Last 3 Encounters:  06/18/23 80.1 kg  04/15/23 75 kg  11/05/21 75 kg     Exam General: Alert and awake, NAD, oriented to self and place, month Cardiovascular: Irregularly irregular, tachycardia Respiratory: Clear to auscultation bilaterally, no wheezing Gastrointestinal: Soft, nontender, nondistended, + bowel sounds Ext: no pedal edema bilaterally Neuro: no new deficits Skin: No rashes Psych: cooperative  Data Reviewed:  I have personally reviewed following labs    CBC Lab Results  Component Value Date   WBC 15.6 (H) 06/23/2023   RBC 4.78 06/23/2023   HGB 14.0 06/23/2023   HCT 42.9 06/23/2023   MCV 89.7 06/23/2023   MCH 29.3 06/23/2023   PLT 364 06/23/2023   MCHC 32.6 06/23/2023   RDW 14.1 06/23/2023   LYMPHSABS 1.5 06/15/2023   MONOABS 3.2 (H) 06/15/2023   EOSABS 0.0 06/15/2023   BASOSABS 0.1  06/15/2023     Last metabolic panel Lab Results  Component Value Date   NA 151 (H) 06/23/2023   K 3.8 06/23/2023   CL 123 (H) 06/23/2023   CO2 20 (L) 06/23/2023   BUN 41 (H) 06/23/2023   CREATININE 1.99 (H) 06/23/2023   GLUCOSE 162 (H) 06/23/2023   GFRNONAA 34 (L) 06/23/2023   GFRAA 50 (L) 04/12/2020   CALCIUM 8.7 (L) 06/23/2023   PHOS 3.3 08/03/2008   PROT 7.7 06/23/2023   ALBUMIN 2.6 (L) 06/23/2023   BILITOT 0.7 06/23/2023   ALKPHOS 34 (L) 06/23/2023   AST 69 (H) 06/23/2023   ALT 102 (H) 06/23/2023   ANIONGAP 8 06/23/2023    CBG (last 3)  Recent Labs    06/22/23 2040 06/23/23 0819 06/23/23 1128  GLUCAP 261* 159* 301*      Coagulation Profile: No results for input(s): "INR", "PROTIME" in the last 168 hours.   Radiology Studies: I have personally reviewed the imaging studies  US Abdomen Limited RUQ (LIVER/GB)  Result Date: 06/22/2023 CLINICAL DATA:  Elevated LFTs EXAM: ULTRASOUND ABDOMEN LIMITED RIGHT UPPER QUADRANT COMPARISON:  06/17/2023 CT FINDINGS: Gallbladder: No gallstones or wall thickening visualized. No sonographic Murphy sign noted by sonographer. Common bile duct: Not well visualized Liver: No focal lesion identified. Within normal limits in parenchymal echogenicity. Portal vein is patent on color Doppler imaging with normal direction of blood flow towards the liver. Other: None. IMPRESSION: No acute abnormality noted. Electronically Signed   By: Alcide Clever M.D.   On: 06/22/2023 19:28       Griffith Santilli M.D. [facility] 06/23/2023, 1:17 PM  Available via Epic secure chat 7am-7pm After 7 pm, please refer to night coverage provider listed on amion.

## 2023-06-23 NOTE — Consult Note (Addendum)
Cardiology Consultation   Patient ID: Eric Zamora MRN: [id number]; DOB: 1946-04-19  Admit date: 06/15/2023 Date of Consult: 06/23/2023  PCP:  Eric Bristle, PA-C   [facility] Providers Cardiologist:  None   {  Patient Profile:   Eric Zamora is a 77 y.o. male with a hx of hypertrophic cardiomyopathy since 2010, dementia, CKD IIIb, CAD, HTN, hx DVT no longer on xarelto, HLD who is being seen 06/23/2023 for the evaluation of afib RVR at the request of Dr. Isidoro Donning.  History of Present Illness:   Patient is generally nonverbal but is able to articulate yes or no and some other simple words.  He was deemed incompetent by the state and has a government guardian.  He is coming from a memory center.  I called his guardian on his chart for further details of his medical history however this person was new and did not have any information to provide.  I was given previous guardians number [phone], but did not get any answer.  He has details hypertrophic cardiomyopathy listed on his chart but I have no details of this nor my able to view any records of this. He does have history of CAD, NSTEMI in 2014. Cath showed moderate LAD disease with moderate to severe ostial first diagonal disease - not ammendable to PCI, medical therapy instead.  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  Mr. Saelee was initially admitted from his SNF facility on 06/15/2023 due to tachypnea and fever with elevated lactic acid of 2.9 hought initially related to UTI.  He had a CT on the 18th that showed a perirectal abscess and was taken to the OR for drainage.  Cardiology has been asked to help assist with A-fib RVR HR 120s.  He was noted to be in A-fib at the time of admission.  Additionally, an echocardiogram was obtained during this admission that noted  hypertrophic obstructive cardiomyopathy with severe asymmetric hypertrophy of the basal septum up to 60 mm and an LVOT obstruction of up to 41 mmHg.  Throughout his course his metoprolol was uptitrated and Cardizem 30 mg has also now been added.  Throughout this admission he was also developed an AKI, hypernatremia possibly related to his Lasix and mild transaminitis thought related to shock liver secondary to sepsis and hypotension.  Again interview limited by patients deficits.  He he declined any complaints and he any shortness of breath, palpitations, chest pain, peripheral edema, dizziness.   Sodium 151, BUN 41, creatinine 1.99, alk phosphatase 34.  WBC 15.6.  CT chest on 1017 showed perirectal abscess and fistula, chronic class opacities concerning for infection with pneumonia.  Moderately sized bilateral effusions.  Coronary artery calcifications.   Past Medical History:  Diagnosis Date   Alzheimer disease (HCC)    Bipolar affective disorder (HCC)    Chronic renal insufficiency    Dementia (HCC)    Dyslipidemia    HTN (hypertension)    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   gradient on TEE   Syncopal episodes     Past Surgical History:  Procedure Laterality Date   INCISION AND DRAINAGE PERIRECTAL ABSCESS N/A 06/18/2023   Procedure: IRRIGATION AND DEBRIDEMENT PERIRECTAL ABSCESS;  Surgeon: Axel Filler, MD;  Location: [facility];  Service: General;  Laterality: N/A;   LEFT HEART CATHETERIZATION WITH CORONARY ANGIOGRAM N/A 07/06/2013  Procedure: LEFT HEART CATHETERIZATION WITH CORONARY ANGIOGRAM;  Surgeon: Marykay Lex, MD;  Location: [facility] CATH LAB;  Service: Cardiovascular;  Laterality: N/A;    Inpatient Medications: Scheduled Meds:  amoxicillin-clavulanate  1 tablet Oral Q12H   apixaban  5 mg Oral BID   doxycycline  100 mg Oral Q12H   feeding supplement  237 mL Oral BID BM   insulin aspart  0-15 Units Subcutaneous TID WC   insulin aspart  0-5 Units Subcutaneous  QHS   insulin aspart  3 Units Subcutaneous TID WC   insulin glargine-yfgn  22 Units Subcutaneous Q24H   metoprolol tartrate  100 mg Oral TID   mouth rinse  15 mL Mouth Rinse 4 times per day   sodium chloride flush  3 mL Intravenous Q12H   Continuous Infusions:  dextrose     PRN Meds: acetaminophen, albuterol, dextrose, ondansetron **OR** ondansetron (ZOFRAN) IV, mouth rinse, mouth rinse, oxyCODONE, traZODone  Allergies:   No Known Allergies  Social History:   Social History   Socioeconomic History   Marital status: Divorced    Spouse name: Not on file   Number of children: 5   Years of education: some college   Highest education level: Not on file  Occupational History   Occupation: Retired  Tobacco Use   Smoking status: Never   Smokeless tobacco: Never  Vaping Use   Vaping status: Never Used  Substance and Sexual Activity   Alcohol use: No   Drug use: Not Currently   Sexual activity: Not on file  Other Topics Concern   Not on file  Social History Narrative   Lives at [facility].   Right-handed.   No daily caffeine use.       Social Determinants of Health   Financial Resource Strain: Not on file  Food Insecurity: Patient Unable To Answer (06/15/2023)   Hunger Vital Sign    Worried About Running Out of Food in the Last Year: Patient unable to answer    Ran Out of Food in the Last Year: Patient unable to answer  Transportation Needs: Patient Unable To Answer (06/15/2023)   PRAPARE - Transportation    Lack of Transportation (Medical): Patient unable to answer    Lack of Transportation (Non-Medical): Patient unable to answer  Physical Activity: Not on file  Stress: Not on file  Social Connections: Not on file  Intimate Partner Violence: Patient Unable To Answer (06/15/2023)   Humiliation, Afraid, Rape, and Kick questionnaire    Fear of Current or Ex-Partner: Patient unable to answer    Emotionally Abused: Patient unable to answer    Physically Abused:  Patient unable to answer    Sexually Abused: Patient unable to answer    Family History:   History reviewed. No pertinent family history.   ROS:  Please see the history of present illness.  All other ROS reviewed and negative.     Physical Exam/Data:   Vitals:   06/23/23 0330 06/23/23 0425 06/23/23 0835 06/23/23 1135  BP: (!) 127/91  117/81 96/67  Pulse: (!) 121  (!) 146 99  Resp: (!) 28     Temp: (!) 101.1 F (38.4 C) 98.5 F (36.9 C) 98.9 F (37.2 C) 99.5 F (37.5 C)  TempSrc:   Oral   SpO2:   99% 100%  Weight:      Height:        Intake/Output Summary (Last 24 hours) at 06/23/2023 1500 Last data filed at 06/23/2023 0900 Gross per 24  hour  Intake 118 ml  Output 1150 ml  Net -1032 ml      06/18/2023    9:42 AM 06/15/2023    3:56 PM 04/15/2023    8:40 PM  Last 3 Weights  Weight (lbs) 176 lb 9.4 oz 176 lb 9.4 oz 165 lb 5.5 oz  Weight (kg) 80.1 kg 80.1 kg 75 kg     Body mass index is 29.39 kg/m.  General: Essentially nonverbal, speaks in one-word sentences yes or no HEENT: normal Neck: no JVD Vascular: No carotid bruits; Distal pulses 2+ bilaterally Cardiac: Irregularly irregular, + 2/6 murmur Lungs:  clear to auscultation bilaterally, no wheezing, rhonchi or rales  Abd: soft, nontender, no hepatomegaly  Ext: no edema Musculoskeletal:  No deformities, BUE and BLE strength normal and equal Skin: warm and dry  Neuro:  CNs 2-12 intact, no focal abnormalities noted Psych:  Normal affect   EKG:  The EKG was personally reviewed and demonstrates: Last EKG was on 1016/2024 and showed sinus tachycardia with heart rate 116.  Some ST depressions.  Will get another EKG. Telemetry:  Telemetry was personally reviewed and demonstrates: Atrial fibrillation at heart rates in the 120s but since about 10:00 he has had heart rates between 90-110  Relevant CV Studies: Echocardiogram 06/16/2023  1. Severe asymmetric hypertrophy of the basal septum up to 16 mm. There  is mitral  valve SAM with LVOT obstruction up to 41 mmHG. There is  moderate, centrally/posterior directed mitral valve regurgitation.  Findings are concerning for hypertrophic  cardiomyopathy. Left ventricular ejection fraction, by estimation, is 60  to 65%. The left ventricle has normal function. Left ventricular  endocardial border not optimally defined to evaluate regional wall motion.  There is severe asymmetric left  ventricular hypertrophy of the basal-septal segment. Left ventricular  diastolic function could not be evaluated.   2. Right ventricular systolic function is normal. The right ventricular  size is normal. There is normal pulmonary artery systolic pressure. The  estimated right ventricular systolic pressure is 23.1 mmHg.   3. Left atrial size was mildly dilated.   4. The mitral valve is grossly normal. Moderate mitral valve  regurgitation. No evidence of mitral stenosis.   5. The aortic valve is tricuspid. There is mild calcification of the  aortic valve. Aortic valve regurgitation is not visualized. Aortic valve  sclerosis is present, with no evidence of aortic valve stenosis.   6. The inferior vena cava is normal in size with greater than 50%  respiratory variability, suggesting right atrial pressure of 3 mmHg.   Laboratory Data:  High Sensitivity Troponin:  No results for input(s): "TROPONINIHS" in the last 720 hours.   Chemistry Recent Labs  Lab 06/21/23 0544 06/22/23 0613 06/23/23 0544  NA 144 149* 151*  K 3.9 3.5 3.8  CL 116* 118* 123*  CO2 17* 22 20*  GLUCOSE 115* 146* 162*  BUN 22 35* 41*  CREATININE 1.50* 1.84* 1.99*  CALCIUM 7.8* 8.3* 8.7*  GFRNONAA 47* 37* 34*  ANIONGAP 11 9 8     Recent Labs  Lab 06/21/23 0544 06/22/23 0613 06/23/23 0544  PROT 6.5 7.5 7.7  ALBUMIN 2.4* 2.5* 2.6*  AST 84* 87* 69*  ALT 142* 127* 102*  ALKPHOS 34* 36* 34*  BILITOT 0.7 0.6 0.7   Lipids No results for input(s): "CHOL", "TRIG", "HDL", "LABVLDL", "LDLCALC", "CHOLHDL" in  the last 168 hours.  Hematology Recent Labs  Lab 06/21/23 0544 06/22/23 0613 06/23/23 0544  WBC 10.9* 15.6* 15.6*  RBC 4.35 4.62 4.78  HGB 12.5* 13.1 14.0  HCT 37.9* 40.4 42.9  MCV 87.1 87.4 89.7  MCH 28.7 28.4 29.3  MCHC 33.0 32.4 32.6  RDW 13.2 13.6 14.1  PLT 273 369 364   Thyroid No results for input(s): "TSH", "FREET4" in the last 168 hours.  BNPNo results for input(s): "BNP", "PROBNP" in the last 168 hours.  DDimer No results for input(s): "DDIMER" in the last 168 hours.   Radiology/Studies:  US Abdomen Limited RUQ (LIVER/GB)  Result Date: 06/22/2023 CLINICAL DATA:  Elevated LFTs EXAM: ULTRASOUND ABDOMEN LIMITED RIGHT UPPER QUADRANT COMPARISON:  06/17/2023 CT FINDINGS: Gallbladder: No gallstones or wall thickening visualized. No sonographic Murphy sign noted by sonographer. Common bile duct: Not well visualized Liver: No focal lesion identified. Within normal limits in parenchymal echogenicity. Portal vein is patent on color Doppler imaging with normal direction of blood flow towards the liver. Other: None. IMPRESSION: No acute abnormality noted. Electronically Signed   By: Alcide Clever M.D.   On: 06/22/2023 19:28     Assessment and Plan:   New onset atrial fibrillation? Unclear of chronicity of this.  Looks to have went into atrial fibrillation on 1016 around 10-2 o'clock.  LA was mildly dilated.  He has no previous EKG showing atrial fibrillation however he did not that he was familiar with this diagnosis in the past.  Appears to be asymptomatic.  He is also likely tachycardic due to underlying infection as well as possible dehydration given hypernatremia, elevated BUN and recent diuresis.  His rates are reasonably controlled now since about 10:00 to 90-110. Increasing Lopressor 100 mg twice daily to 3 times daily.  Continue Eliquis 5 mg twice daily.  HR goal less than 110.  Can plan for outpatient cardioversion in 3 weeks if patient does not convert on his own.  Normal  TSH.  Hypertrophic cardiomyopathy since 2010 Moderate MR Does not appear this has been actively managed.  He has severe asymptomatic hypertrophy of the basal septum up to 16 mm.  Mitral valve SAM with LVOT 41 mmHg.  Normal RV function.  Appears to be euvolemic.  Was treated with IV Lasix however now sodium is trending up, diuretics have been stopped.  Continue beta-blocker as above.   CAD Per records has LAD disease with moderate to severe first diagonal disease. No aspirin with eliquis. His plavix has also been discontinued. Statin being held for transaminitis   CKD Dementia Hypernatremia, gently hydrate Sepsis secondary to perirectal abscess Per primary team.  On antibiotics    Risk Assessment/Risk Scores:   CHA2DS2-VASc Score = 4   This indicates a 4.8% annual risk of stroke. The patient's score is based upon: CHF History: 0 HTN History: 1 Diabetes History: 0 Stroke History: 0 Vascular Disease History: 1 Age Score: 2 Gender Score: 0   For questions or updates, please contact [facility] Please consult [email or web address] for contact info under    Signed, Abagail Kitchens, PA-C  06/23/2023 3:00 PM   I have seen and examined the patient along with Abagail Kitchens, PA-C .  I have reviewed the chart, notes and new data.  I agree with PA/NP's note.  Key new complaints: No complaints.  Lying comfortably flat in bed without respiratory difficulty. Key examination changes: Rapid irregular rhythm, systolic murmur varying intensity.  No evidence of jugular venous distention or peripheral edema. Key new findings / data: Elevated WBC ECG and telemetry reviewed.  He presented in sinus rhythm/sinus tachycardia and converted  to atrial fibrillation sometime between 10 AM and 2 PM on 06/16/2023 (there is a gap on telemetry).  He has been in atrial fibrillation with varying degrees of RVR ever since  PLAN: In the setting of hypertrophic obstructive cardiomyopathy it is important to  achieve better rate control, preferentially using beta-1 selective blockers, avoiding agents with vasodilator properties if possible (in other words prefer a maximum dose of metoprolol rather than the use of carvedilol and diltiazem).  Will add diltiazem only if we cannot achieve good rate control with metoprolol monotherapy.  On the other hand, a small degree of tachycardia is likely appropriate in the setting of infection, recovery from surgery, possibly some hypovolemia (note hyperchloremic hypernatremia and some degree of superimposed prerenal azotemia) etc.  Would shoot for a ventricular rate around 100 bpm or so.  He has a history of CAD but has not had any acute coronary events in many years.  I do not think the risk of bleeding with combined antiplatelet therapy plus anticoagulation is justified at this point.  Continue with anticoagulation with Eliquis alone.  Thurmon Fair, MD, [facility] [phone] 06/23/2023, 3:23 PM

## 2023-06-23 NOTE — Progress Notes (Signed)
   06/23/23 0212  Assess: MEWS Score  Temp (!) 101.5 F (38.6 C)  BP 125/81  MAP (mmHg) 94  Pulse Rate (!) 126  Resp 15  SpO2 97 %  O2 Device Room Air  Assess: MEWS Score  MEWS Temp 2  MEWS Systolic 0  MEWS Pulse 2  MEWS RR 0  MEWS LOC 0  MEWS Score 4  MEWS Score Color Red  Assess: if the MEWS score is Yellow or Red  Were vital signs accurate and taken at a resting state? Yes  Does the patient meet 2 or more of the SIRS criteria? Yes  Does the patient have a confirmed or suspected source of infection? Yes  MEWS guidelines implemented  Yes, red  Treat  MEWS Interventions Considered administering scheduled or prn medications/treatments as ordered  Take Vital Signs  Increase Vital Sign Frequency  Red: Q1hr x2, continue Q4hrs until patient remains green for 12hrs  Escalate  MEWS: Escalate Red: Discuss with charge nurse and notify provider. Consider notifying RRT. If remains red for 2 hours consider need for higher level of care  Notify: Charge Nurse/RN  Name of Charge Nurse/RN Notified tom  Provider Notification  Provider Name/Title [email or web address]  Date Provider Notified 06/23/23  Time Provider Notified [phone]  Method of Notification Page (secure chat)  Notification Reason Other (Comment) (mews yellow to red)  Provider response No new orders  Date of Provider Response 06/23/23  Time of Provider Response 0245  Notify: Rapid Response  Name of Rapid Response RN Notified janelle  Date Rapid Response Notified 06/23/23  Time Rapid Response Notified 0243  Assess: SIRS CRITERIA  SIRS Temperature  1  SIRS Pulse 1  SIRS Respirations  0  SIRS WBC 0  SIRS Score Sum  2   PRN tylenol given per orders for fever, will continue to monitor

## 2023-06-23 NOTE — Progress Notes (Signed)
MD and Rapid team notified on mews increase from 4v to 6, NP came to bedside to see patient, verbally ordered to place ice packs on patient, patient with ice pack in place to bilateral arm pits and bilateral torso, will continue to monitor.

## 2023-06-23 NOTE — Progress Notes (Signed)
   06/23/23 0835  Vitals  Temp 98.9 F (37.2 C)  Temp Source Oral  BP 117/81  MAP (mmHg) 93  BP Location Right Arm  BP Method Automatic  Patient Position (if appropriate) Lying  Pulse Rate (!) 146  Pulse Rate Source Monitor  MEWS COLOR  MEWS Score Color Red  Oxygen Therapy  SpO2 99 %  O2 Device Room Air  MEWS Score  MEWS Temp 0  MEWS Systolic 0  MEWS Pulse 3  MEWS RR 2  MEWS LOC 1  MEWS Score 6   Patient had been running in 140's on A.fib RVR. MD at bedside ordered Cardizem 10mg  IV, EKG done. Pt. Placed on scheduled metoprolol p.o. Pt. Is closely monitored.

## 2023-06-23 NOTE — TOC Progression Note (Signed)
Transition of Care [facility]) - Progression Note    Patient Details  Name: Eric Zamora MRN: [id number] Date of Birth: [birth year]  Transition of Care [facility]) CM/SW Contact  Otelia Santee, LCSW Phone Number: 06/23/2023, 12:38 PM  Clinical Narrative:    Left voicemail for pt's legal guardian to review bed offer for SNF.    Expected Discharge Plan: Skilled Nursing Facility Barriers to Discharge: SNF Pending bed offer, Inadequate or no insurance  Expected Discharge Plan and Services     Post Acute Care Choice: Skilled Nursing Facility Living arrangements for the past 2 months: Assisted Living Facility [facility])                                       Social Determinants of Health (SDOH) Interventions SDOH Screenings   Food Insecurity: Patient Unable To Answer (06/15/2023)  Housing: Low Risk  (06/15/2023)  Transportation Needs: Patient Unable To Answer (06/15/2023)  Utilities: Patient Unable To Answer (06/15/2023)  Tobacco Use: Low Risk  (06/18/2023)    Readmission Risk Interventions     No data to display

## 2023-06-23 NOTE — Plan of Care (Signed)
  Problem: Education: Goal: Ability to describe self-care measures that may prevent or decrease complications (Diabetes Survival Skills Education) will improve Outcome: Progressing Goal: Individualized Educational Video(s) Outcome: Progressing   Problem: Coping: Goal: Ability to adjust to condition or change in health will improve Outcome: Progressing   Problem: Fluid Volume: Goal: Ability to maintain a balanced intake and output will improve Outcome: Progressing   Problem: Health Behavior/Discharge Planning: Goal: Ability to identify and utilize available resources and services will improve Outcome: Progressing Goal: Ability to manage health-related needs will improve Outcome: Progressing   Problem: Metabolic: Goal: Ability to maintain appropriate glucose levels will improve Outcome: Progressing   Problem: Nutritional: Goal: Maintenance of adequate nutrition will improve Outcome: Progressing Goal: Progress toward achieving an optimal weight will improve Outcome: Progressing   Problem: Skin Integrity: Goal: Risk for impaired skin integrity will decrease Outcome: Progressing   Problem: Tissue Perfusion: Goal: Adequacy of tissue perfusion will improve Outcome: Progressing   Problem: Education: Goal: Knowledge of General Education information will improve Description: Including pain rating scale, medication(s)/side effects and non-pharmacologic comfort measures Outcome: Progressing   Problem: Health Behavior/Discharge Planning: Goal: Ability to manage health-related needs will improve Outcome: Progressing   Problem: Clinical Measurements: Goal: Ability to maintain clinical measurements within normal limits will improve Outcome: Progressing Goal: Will remain free from infection Outcome: Progressing Goal: Diagnostic test results will improve Outcome: Progressing Goal: Respiratory complications will improve Outcome: Progressing Goal: Cardiovascular complication will  be avoided Outcome: Progressing   Problem: Activity: Goal: Risk for activity intolerance will decrease Outcome: Progressing   Problem: Nutrition: Goal: Adequate nutrition will be maintained Outcome: Progressing   Problem: Coping: Goal: Level of anxiety will decrease Outcome: Progressing   Problem: Elimination: Goal: Will not experience complications related to bowel motility Outcome: Progressing Goal: Will not experience complications related to urinary retention Outcome: Progressing   Problem: Pain Managment: Goal: General experience of comfort will improve Outcome: Progressing   Problem: Safety: Goal: Ability to remain free from injury will improve Outcome: Progressing   Problem: Skin Integrity: Goal: Risk for impaired skin integrity will decrease Outcome: Progressing   Problem: Fluid Volume: Goal: Hemodynamic stability will improve Outcome: Progressing   Problem: Clinical Measurements: Goal: Diagnostic test results will improve Outcome: Progressing Goal: Signs and symptoms of infection will decrease Outcome: Progressing   Problem: Respiratory: Goal: Ability to maintain adequate ventilation will improve Outcome: Progressing   Problem: Education: Goal: Ability to describe self-care measures that may prevent or decrease complications (Diabetes Survival Skills Education) will improve Outcome: Progressing Goal: Individualized Educational Video(s) Outcome: Progressing   Problem: Cardiac: Goal: Ability to maintain an adequate cardiac output will improve Outcome: Progressing   Problem: Health Behavior/Discharge Planning: Goal: Ability to identify and utilize available resources and services will improve Outcome: Progressing Goal: Ability to manage health-related needs will improve Outcome: Progressing   Problem: Fluid Volume: Goal: Ability to achieve a balanced intake and output will improve Outcome: Progressing   Problem: Metabolic: Goal: Ability to  maintain appropriate glucose levels will improve Outcome: Progressing   Problem: Nutritional: Goal: Maintenance of adequate nutrition will improve Outcome: Progressing Goal: Maintenance of adequate weight for body size and type will improve Outcome: Progressing   Problem: Respiratory: Goal: Will regain and/or maintain adequate ventilation Outcome: Progressing   Problem: Urinary Elimination: Goal: Ability to achieve and maintain adequate renal perfusion and functioning will improve Outcome: Progressing

## 2023-06-23 NOTE — Progress Notes (Signed)
5 Days Post-Op   Subjective/Chief Complaint: PT with no acute changes     Objective: Vital signs in last 24 hours: Temp:  [97.6 F (36.4 C)-101.5 F (38.6 C)] 98.5 F (36.9 C) (10/23 0425) Pulse Rate:  [103-143] 121 (10/23 0330) Resp:  [15-28] 28 (10/23 0330) BP: (110-137)/(67-91) 127/91 (10/23 0330) SpO2:  [96 %-99 %] 97 % (10/23 0212) Last BM Date : 06/22/23  Intake/Output from previous day: 10/22 0701 - 10/23 0700 In: -  Out: 1150 [Urine:1150] Intake/Output this shift: No intake/output data recorded.  General appearance: alert and cooperative Incision/Wound: Pt with no induration near wound, penrose in place with min output  Lab Results:  Recent Labs    06/22/23 0613 06/23/23 0544  WBC 15.6* 15.6*  HGB 13.1 14.0  HCT 40.4 42.9  PLT 369 364   BMET Recent Labs    06/22/23 0613 06/23/23 0544  NA 149* 151*  K 3.5 3.8  CL 118* 123*  CO2 22 20*  GLUCOSE 146* 162*  BUN 35* 41*  CREATININE 1.84* 1.99*  CALCIUM 8.3* 8.7*   PT/INR No results for input(s): "LABPROT", "INR" in the last 72 hours. ABG No results for input(s): "PHART", "HCO3" in the last 72 hours.  Invalid input(s): "PCO2", "PO2"  Studies/Results: US Abdomen Limited RUQ (LIVER/GB)  Result Date: 06/22/2023 CLINICAL DATA:  Elevated LFTs EXAM: ULTRASOUND ABDOMEN LIMITED RIGHT UPPER QUADRANT COMPARISON:  06/17/2023 CT FINDINGS: Gallbladder: No gallstones or wall thickening visualized. No sonographic Murphy sign noted by sonographer. Common bile duct: Not well visualized Liver: No focal lesion identified. Within normal limits in parenchymal echogenicity. Portal vein is patent on color Doppler imaging with normal direction of blood flow towards the liver. Other: None. IMPRESSION: No acute abnormality noted. Electronically Signed   By: Alcide Clever M.D.   On: 06/22/2023 19:28    Anti-infectives: Anti-infectives (From admission, onward)    Start     Dose/Rate Route Frequency Ordered Stop   06/21/23  1215  doxycycline (VIBRA-TABS) tablet 100 mg        100 mg Oral Every 12 hours 06/21/23 1128 07/05/23 0959   06/21/23 1215  amoxicillin-clavulanate (AUGMENTIN) 875-125 MG per tablet 1 tablet        1 tablet Oral Every 12 hours 06/21/23 1128 07/05/23 0959   06/20/23 1200  vancomycin (VANCOREADY) IVPB 1500 mg/300 mL  Status:  Discontinued        1,500 mg 150 mL/hr over 120 Minutes Intravenous Every 36 hours 06/20/23 0719 06/21/23 1128   06/19/23 0000  vancomycin (VANCOREADY) IVPB 1250 mg/250 mL  Status:  Discontinued        1,250 mg 166.7 mL/hr over 90 Minutes Intravenous Every 36 hours 06/18/23 0939 06/20/23 0719   06/18/23 1800  ceFEPIme (MAXIPIME) 2 g in sodium chloride 0.9 % 100 mL IVPB  Status:  Discontinued        2 g 200 mL/hr over 30 Minutes Intravenous Every 12 hours 06/18/23 0939 06/21/23 1128   06/18/23 0600  ceFEPIme (MAXIPIME) 2 g in sodium chloride 0.9 % 100 mL IVPB  Status:  Discontinued        2 g 200 mL/hr over 30 Minutes Intravenous Every 24 hours 06/17/23 0920 06/18/23 0939   06/17/23 1400  vancomycin (VANCOCIN) IVPB 1000 mg/200 mL premix  Status:  Discontinued        1,000 mg 200 mL/hr over 60 Minutes Intravenous Every 36 hours 06/17/23 0920 06/18/23 0939   06/16/23 1800  vancomycin (VANCOREADY) IVPB 750 mg/150  mL  Status:  Discontinued        750 mg 150 mL/hr over 60 Minutes Intravenous Every 24 hours 06/15/23 1639 06/17/23 0920   06/16/23 1100  cefTRIAXone (ROCEPHIN) 2 g in sodium chloride 0.9 % 100 mL IVPB  Status:  Discontinued        2 g 200 mL/hr over 30 Minutes Intravenous Every 24 hours 06/15/23 1453 06/15/23 1455   06/15/23 1800  ceFEPIme (MAXIPIME) 2 g in sodium chloride 0.9 % 100 mL IVPB  Status:  Discontinued        2 g 200 mL/hr over 30 Minutes Intravenous Every 12 hours 06/15/23 1558 06/17/23 0920   06/15/23 1700  metroNIDAZOLE (FLAGYL) IVPB 500 mg  Status:  Discontinued        500 mg 100 mL/hr over 60 Minutes Intravenous Every 12 hours 06/15/23 1455  06/21/23 1128   06/15/23 1630  vancomycin (VANCOREADY) IVPB 1500 mg/300 mL        1,500 mg 150 mL/hr over 120 Minutes Intravenous  Once 06/15/23 1558 06/15/23 2058   06/15/23 1045  cefTRIAXone (ROCEPHIN) 2 g in sodium chloride 0.9 % 100 mL IVPB        2 g 200 mL/hr over 30 Minutes Intravenous  Once 06/15/23 1041 06/15/23 1155       Assessment/Plan: s/p Procedure(s): IRRIGATION AND DEBRIDEMENT PERIRECTAL ABSCESS (N/A) -Penrose removed, just needs local wound care -and per primary -cont' sitz baths  LOS: 8 days    Axel Filler 06/23/2023

## 2023-06-24 ENCOUNTER — Inpatient Hospital Stay (HOSPITAL_COMMUNITY): Payer: Medicare (Managed Care)

## 2023-06-24 DIAGNOSIS — R1310 Dysphagia, unspecified: Secondary | ICD-10-CM | POA: Diagnosis not present

## 2023-06-24 DIAGNOSIS — A419 Sepsis, unspecified organism: Secondary | ICD-10-CM | POA: Diagnosis not present

## 2023-06-24 DIAGNOSIS — I63411 Cerebral infarction due to embolism of right middle cerebral artery: Secondary | ICD-10-CM

## 2023-06-24 DIAGNOSIS — R4182 Altered mental status, unspecified: Secondary | ICD-10-CM | POA: Diagnosis not present

## 2023-06-24 DIAGNOSIS — R6520 Severe sepsis without septic shock: Secondary | ICD-10-CM | POA: Diagnosis not present

## 2023-06-24 DIAGNOSIS — E1110 Type 2 diabetes mellitus with ketoacidosis without coma: Secondary | ICD-10-CM | POA: Diagnosis not present

## 2023-06-24 LAB — COMPREHENSIVE METABOLIC PANEL
ALT: 85 U/L — ABNORMAL HIGH (ref 0–44)
AST: 73 U/L — ABNORMAL HIGH (ref 15–41)
Albumin: 2.7 g/dL — ABNORMAL LOW (ref 3.5–5.0)
Alkaline Phosphatase: 32 U/L — ABNORMAL LOW (ref 38–126)
Anion gap: 9 (ref 5–15)
BUN: 42 mg/dL — ABNORMAL HIGH (ref 8–23)
CO2: 20 mmol/L — ABNORMAL LOW (ref 22–32)
Calcium: 8.6 mg/dL — ABNORMAL LOW (ref 8.9–10.3)
Chloride: 123 mmol/L — ABNORMAL HIGH (ref 98–111)
Creatinine, Ser: 2.06 mg/dL — ABNORMAL HIGH (ref 0.61–1.24)
GFR, Estimated: 32 mL/min — ABNORMAL LOW (ref >60)
Glucose, Bld: 217 mg/dL — ABNORMAL HIGH (ref 70–99)
Potassium: 3.8 mmol/L (ref 3.5–5.1)
Sodium: 152 mmol/L — ABNORMAL HIGH (ref 135–145)
Total Bilirubin: 0.6 mg/dL (ref 0.3–1.2)
Total Protein: 7.4 g/dL (ref 6.5–8.1)

## 2023-06-24 LAB — GLUCOSE, CAPILLARY
Glucose-Capillary: 121 mg/dL — ABNORMAL HIGH (ref 70–99)
Glucose-Capillary: 134 mg/dL — ABNORMAL HIGH (ref 70–99)
Glucose-Capillary: 144 mg/dL — ABNORMAL HIGH (ref 70–99)
Glucose-Capillary: 170 mg/dL — ABNORMAL HIGH (ref 70–99)
Glucose-Capillary: 177 mg/dL — ABNORMAL HIGH (ref 70–99)
Glucose-Capillary: 189 mg/dL — ABNORMAL HIGH (ref 70–99)
Glucose-Capillary: 42 mg/dL — CL (ref 70–99)

## 2023-06-24 MED ORDER — AMOXICILLIN-POT CLAVULANATE 500-125 MG PO TABS
1.0000 | ORAL_TABLET | Freq: Two times a day (BID) | ORAL | Status: DC
  Administered 2023-06-24 – 2023-07-03 (×18): 1 via ORAL
  Filled 2023-06-24 (×19): qty 1

## 2023-06-24 MED ORDER — DEXTROSE 50 % IV SOLN
25.0000 g | INTRAVENOUS | Status: AC
  Administered 2023-06-24: 25 g via INTRAVENOUS
  Filled 2023-06-24: qty 50

## 2023-06-24 MED ORDER — AMOXICILLIN-POT CLAVULANATE 500-125 MG PO TABS: 1.0000 | ORAL_TABLET | Freq: Two times a day (BID) | ORAL | Status: DC

## 2023-06-24 MED ORDER — NYSTATIN 100000 UNIT/ML MT SUSP
5.0000 mL | Freq: Four times a day (QID) | OROMUCOSAL | Status: DC
  Administered 2023-06-24 – 2023-07-01 (×27): 500000 [IU] via ORAL
  Filled 2023-06-24 (×27): qty 5

## 2023-06-24 MED ORDER — STROKE: EARLY STAGES OF RECOVERY BOOK
Freq: Once | Status: AC
  Filled 2023-06-24: qty 1

## 2023-06-24 MED ORDER — DEXTROSE 5 % IV SOLN: INTRAVENOUS | Status: DC

## 2023-06-24 MED ORDER — LACTATED RINGERS IV BOLUS
1000.0000 mL | Freq: Once | INTRAVENOUS | Status: AC
  Administered 2023-06-24: 1000 mL via INTRAVENOUS

## 2023-06-24 NOTE — TOC Progression Note (Signed)
Transition of Care [facility]) - Progression Note    Patient Details  Name: SHOHEI FOGLIA MRN: [id number] Date of Birth: 1945-11-23  Transition of Care [facility]) CM/SW Contact  Otelia Santee, LCSW Phone Number: 06/24/2023, 12:04 PM  Clinical Narrative:    Spoke with pt's guardian who has accepted bed offer for SNF at [facility]. Have contacted [facility] to accept offer and currently awaiting confirmation.   Expected Discharge Plan: Skilled Nursing Facility Barriers to Discharge: SNF Pending bed offer, Inadequate or no insurance  Expected Discharge Plan and Services     Post Acute Care Choice: Skilled Nursing Facility Living arrangements for the past 2 months: Assisted Living Facility [facility])                                       Social Determinants of Health (SDOH) Interventions SDOH Screenings   Food Insecurity: Patient Unable To Answer (06/15/2023)  Housing: Low Risk  (06/15/2023)  Transportation Needs: Patient Unable To Answer (06/15/2023)  Utilities: Patient Unable To Answer (06/15/2023)  Tobacco Use: Low Risk  (06/18/2023)    Readmission Risk Interventions     No data to display

## 2023-06-24 NOTE — Consult Note (Signed)
Neurology Consultation Reason for Consult: Altered mental status, stroke on Head CT Requesting Physician: Ripudeep Rai  CC: Altered mental status, fever, weakness  History is obtained from: Chart review, nurse at bedside  HPI: Eric Zamora is a 77 y.o. male with a past medical history significant for bipolar disorder previously on Depakote, hyperlipidemia, hypertrophic cardiomyopathy, hypertension,  Per nursing, visitors at bedside earlier today reported that normally he is ambulatory and conversant as well as continent but does have some disorientation to year.  I am unable to reach his legal guardian listed in the chart or this visitor at the time of my evaluation  On initial arrival 06/15/2023 he was oriented to self, place but not time.  He was found to have a perirectal abscess which underwent incision and drainage.  He developed new onset atrial fibrillation.  He was additionally noted to be on Keppra 500 twice daily for unclear indication and did receive 2 doses of this medication on 10/16 but this was subsequently discontinued.  Hospital course has been complicated by new diagnosis of diabetes, new onset atrial fibrillation 10/17, ongoing concerns for aspiration, worsening hypernatremia the last several days (attributed to lasix), transaminitis attributed to shock liver, and difficulty obtaining collateral information  He did miss a dose of Eliquis on 10/22 morning, charted reason "n.p.o."  Regarding antibiotic course, he was initially treated with vancomycin, metronidazole and cefepime from 10/15 through 10/20.  Subsequently he was narrowed to doxycycline and amoxicillin, planned for 14-day course.  He has continued to be intermittently febrile, last documented fever 10/23 at 2-3 AM (101.5)  Seizure history is very unclear.  There are some ED notes (08/19/2020 and 02/18/2020 noting presentation for seizure and fall per nursing facility staff as well as possible left arm focal seizure  respectively).  In the past appears he has been on Depakote for mood stabilization (noted to be on this medication for many years and notes from 2009 at which time his level was 64.6; most recent level was 23 04/12/2020).  No specific details of seizure history and has outpatient neurology note from 12/10/2020, at which time he was being evaluated for frequent falls.  Mini-Mental status examination was noted to be 20 out of 30 and he was noted to spend most of his time in a wheelchair.  Specifically he lost 4 points on orientation to place but had full points on orientation to time; he also lost 5 points on attention/calculation and 1 point for being unable to write a sentence.  Neurological examination was notable for mild fixation of the left upper extremity on rapid rotating movements and a quick/rushed ambulation pace as well as hyperreflexia at the bilateral patellae.  Labs and MRI brain/cervical and thoracic spine were ordered.  MRI cervical spine showed a chronic left C1 fracture with normal spinal cord.  MRI brain showed moderate generalized cortical atrophy and small lacunar infarctions that were all chronic in nature (left cerebellum, right caudate head, right centrum semiovale in particular).  MRI thoracic spine was normal.  Per telephone notes that subsequent to that visit, RPR resulted positive at 1:2 titer, family was noted to not be involved in patient's care and facility was pursuing a legal guardian for the patient.  There was a plan to perform lumbar puncture but I cannot see any results of this procedure nor can I confirm it was completed at this time.  The remainder of his labs (HIV, B12, CK) were reassuring at the time  LKW: Unclear Thrombolytic given?:  No, out of the window IA performed?: No, out of the window Premorbid modified rankin scale: 3-4     3 - Moderate disability. Requires some help, but able to walk unassisted.     4 - Moderately severe disability. Unable to attend to own  bodily needs without assistance, and unable to walk unassisted.   ROS: Unable to obtain due to altered mental status.   Past Medical History:  Diagnosis Date   Alzheimer disease (HCC)    Bipolar affective disorder (HCC)    Chronic renal insufficiency    Dementia (HCC)    Dyslipidemia    HTN (hypertension)    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   gradient on TEE   Syncopal episodes    Past Surgical History:  Procedure Laterality Date   INCISION AND DRAINAGE PERIRECTAL ABSCESS N/A 06/18/2023   Procedure: IRRIGATION AND DEBRIDEMENT PERIRECTAL ABSCESS;  Surgeon: Axel Filler, MD;  Location: [facility];  Service: General;  Laterality: N/A;   LEFT HEART CATHETERIZATION WITH CORONARY ANGIOGRAM N/A 07/06/2013   Procedure: LEFT HEART CATHETERIZATION WITH CORONARY ANGIOGRAM;  Surgeon: Marykay Lex, MD;  Location: [facility] CATH LAB;  Service: Cardiovascular;  Laterality: N/A;   Current Outpatient Medications  Medication Instructions   clopidogrel (PLAVIX) 75 mg, Oral, Daily   ergocalciferol (VITAMIN D2) 50,000 Units, Oral, Every 30 days, On the 7 th of every month.    furosemide (LASIX) 40 mg, Oral, Daily   latanoprost (XALATAN) 0.005 % ophthalmic solution 1 drop, Both Eyes, Daily at bedtime   levETIRAcetam (KEPPRA) 500 mg, Oral, 2 times daily   metoprolol tartrate (LOPRESSOR) 25 mg, Oral, 2 times daily   PARoxetine (PAXIL) 10 mg, Oral, Daily   potassium chloride SA (K-DUR,KLOR-CON) 20 MEQ tablet 40 mEq, Oral, Daily   simvastatin (ZOCOR) 20 mg, Oral, Daily at bedtime     History reviewed. No pertinent family history.   Social History:  reports that he has never smoked. He has never used smokeless tobacco. He reports that he does not currently use drugs. He reports that he does not drink alcohol.   Exam: Current vital signs: BP (!) 101/56 (BP Location: Left Arm)   Pulse 79   Temp 98 F (36.7 C)   Resp 16   Ht 5\' 5"  (1.651 m)   Wt 80.1 kg   SpO2 98%   BMI 29.39 kg/m   Vital signs in last 24 hours: Temp:  [98 F (36.7 C)-99.1 F (37.3 C)] 98 F (36.7 C) (10/24 1204) Pulse Rate:  [79-104] 79 (10/24 1204) Resp:  [16-20] 16 (10/24 1204) BP: (92-109)/(56-68) 101/56 (10/24 1204) SpO2:  [97 %-100 %] 98 % (10/24 1204)   Physical Exam  Constitutional: Appears chronically ill, laying in his stool but does not appear aware of this Psych: Affect calm, intermittently follows some commands Eyes: No scleral injection HENT: No oropharyngeal obstruction.  MSK: no major joint deformities.  Cardiovascular: Perfusing extremities well Respiratory: Effort normal, non-labored breathing GI: Soft.  No distension. There is no tenderness.   Neuro: Mental Status: Patient appears to prefer to keep his eyes closed but is awake, oriented to self only, perseverates and demonstrates echolalia, highly limiting language testing.  Unable to give any significant history.  Frequently his words are mumbling and difficult to understand but at times he does speak quickly.  Is able to repeat "today is a sunny day" after multiple requests.  Does appear to have a right gaze preference, difficult to fully assess for neglect given his  baseline participation in exam Cranial Nerves: II: Visual Fields are full to orienting to fingers in all quadrants, but does not accurately report number of fingers due to perseveration. Pupils are equal, round, and reactive to light.   III,IV, VI: Right gaze preference, difficult to assess how much she will cross over the left due to preference of keeping his eyes closed V: Facial sensation is symmetric to light eyelash brush  VII: Facial movement is symmetric on grimace to light noxious stimulation VIII: hearing is intact to voice X: Uvula unable to assess secondary to patient's mental status  XI: Shoulder shrug is symmetric. XII: tongue is midline without atrophy or fasciculations.  Motor: Does appear to be 5/5 in the bilateral upper and lower extremities  although slightly slower to respond on the left and possibly just slightly weaker on the left --he did actually participate in confrontational strength testing with some encouragement.  Appears to have significant asterixis with movement Sensory: Equally reactive to touch in all 4 extremities, does not clearly report if sensation is symmetric bilaterally Deep Tendon Reflexes: 2+ and symmetric in the brachioradialis; difficulty to elicit in the patellar's due to patient's lack of relaxation Cerebellar: Finger-to-nose intact bilaterally within limits of asterixis.  Heel-to-shin command difficult for patient to follow Gait:  Deferred for patient's safety  NIHSS total 6 Score breakdown: 2 points for incorrectly answering month and age, 1 point for intermittently not following commands, 1 point for right gaze preference, 1 point for mild to moderate aphasia (versus delirium), 1 point for dysarthria    I have reviewed labs in epic and the results pertinent to this consultation are:  Basic Metabolic Panel: Recent Labs  Lab 06/20/23 0302 06/21/23 0544 06/22/23 0613 06/23/23 0544 06/24/23 1245  NA 145 144 149* 151* 152*  K 3.7 3.9 3.5 3.8 3.8  CL 119* 116* 118* 123* 123*  CO2 20* 17* 22 20* 20*  GLUCOSE 91 115* 146* 162* 217*  BUN 26* 22 35* 41* 42*  CREATININE 1.44* 1.50* 1.84* 1.99* 2.06*  CALCIUM 7.8* 7.8* 8.3* 8.7* 8.6*    CBC: Recent Labs  Lab 06/19/23 0415 06/20/23 0302 06/21/23 0544 06/22/23 0613 06/23/23 0544  WBC 12.9* 9.7 10.9* 15.6* 15.6*  HGB 10.8* 11.5* 12.5* 13.1 14.0  HCT 34.6* 36.3* 37.9* 40.4 42.9  MCV 92.8 92.4 87.1 87.4 89.7  PLT 231 253 273 369 364    Coagulation Studies: No results for input(s): "LABPROT", "INR" in the last 72 hours.   Lab Results  Component Value Date   VITAMINB12 575 12/10/2020    Lab Results  Component Value Date   TSH 1.976 06/15/2023    I have reviewed the images obtained:  Head CT 1. Acute/subacute cortical and  subcortical right MCA territory infarct centered within the right frontal operculum/right insula, measuring 5.2 x 3.2 cm in transaxial dimensions. Curvilinear and ill-defined hyperdensity within portions of the infarction territory likely reflecting petechial hemorrhage. Local mass effect without midline shift or significant ventricular effacement. 2. Chronic lacunar infarct within the right caudate nucleus. 3. Cerebral atrophy.  ECHO  1. Severe asymmetric hypertrophy of the basal septum up to 16 mm. There  is mitral valve SAM with LVOT obstruction up to 41 mmHG. There is  moderate, centrally/posterior directed mitral valve regurgitation.  Findings are concerning for hypertrophic  cardiomyopathy. Left ventricular ejection fraction, by estimation, is 60  to 65%. The left ventricle has normal function. Left ventricular  endocardial border not optimally defined to evaluate regional wall motion.  There is severe asymmetric left  ventricular hypertrophy of the basal-septal segment. Left ventricular  diastolic function could not be evaluated.   2. Right ventricular systolic function is normal. The right ventricular  size is normal. There is normal pulmonary artery systolic pressure. The  estimated right ventricular systolic pressure is 23.1 mmHg.   3. Left atrial size was mildly dilated.   4. The mitral valve is grossly normal. Moderate mitral valve  regurgitation. No evidence of mitral stenosis.   5. The aortic valve is tricuspid. There is mild calcification of the  aortic valve. Aortic valve regurgitation is not visualized. Aortic valve  sclerosis is present, with no evidence of aortic valve stenosis.   6. The inferior vena cava is normal in size with greater than 50%  respiratory variability, suggesting right atrial pressure of 3 mmHg.    Assessment: 77 year old gentleman presenting with sepsis in the setting of perirectal abscess s/p incision and drainage with hospital course  complicated by aspiration events, new onset A-fib with RVR, new diagnosis of diabetes, unclear history of seizures  Recommendations:  # Right MCA embolic appearing stroke - Stroke labs fasting lipid panel, goal LDL < 70 - A1c not meeting goal of less than 7%, appreciate management by primary team - MRI brain without contrast (considered with contrast due to concern for syphilis but will hold off due to renal function) - MRA of the brain without contrast and Carotid duplex   - Frequent neuro checks - Holding Eliquis for now, will determine start date pending further imaging.  When anticoagulation is restarted would use heparin drip if there is still consideration for needing lumbar puncture to rule out neurosyphilis - Please avoid hypotonic solutions due to risk for fluid shift - Risk factor modification - Blood pressure goal Normotension given unclear last known well and last dose of Eliquis this morning as well as patient tolerating normotension without worsening - PT consult, OT consult, Speech already on board - Needs further clarification of syphilis history and whether LP was ever completed outpatient.  In the interim I will repeat RPR/HIV  # Unclear seizure history - Continue to hold Depakote due to hepatic function - Hold Keppra for now - Routine EEG  # Transaminitis, asterixis # AMS # History of elevated RPR titer with plan for LP  - CK - Ammonia level - B1, B12 - EEG  Neurology will follow workup above.   Brooke Dare MD-PhD [facility] [phone] Available 7 AM to 7 PM, outside these hours please contact Neurologist on call listed on AMION   Discussed with primary team via phone and secure chat

## 2023-06-24 NOTE — Plan of Care (Signed)
  Problem: Coping: Goal: Ability to adjust to condition or change in health will improve Outcome: Progressing   Problem: Fluid Volume: Goal: Ability to maintain a balanced intake and output will improve Outcome: Progressing   Problem: Education: Goal: Ability to describe self-care measures that may prevent or decrease complications (Diabetes Survival Skills Education) will improve Outcome: Not Progressing   Problem: Metabolic: Goal: Ability to maintain appropriate glucose levels will improve Outcome: Not Progressing   Problem: Nutritional: Goal: Maintenance of adequate nutrition will improve Outcome: Not Progressing Goal: Progress toward achieving an optimal weight will improve Outcome: Not Progressing

## 2023-06-24 NOTE — TOC Progression Note (Signed)
Transition of Care [facility]) - Progression Note    Patient Details  Name: Eric Zamora MRN: [id number] Date of Birth: 1946/08/25  [facility] of [facility]) CM/SW Contact  Otelia Santee, LCSW Phone Number: 06/24/2023, 2:04 PM  Clinical Narrative:    Spoke with pt's guardian via t/c to review bed offer for SNF. Pt's guardian has accepted bed offer.  Current plan for pt to transfer to [facility] tomorrow.    Expected Discharge Plan: Skilled Nursing Facility Barriers to Discharge: SNF Pending bed offer, Inadequate or no insurance  Expected Discharge Plan and Services     Post Acute Care Choice: Skilled Nursing Facility Living arrangements for the past 2 months: Assisted Living Facility [facility])                                       Social Determinants of Health (SDOH) Interventions SDOH Screenings   Food Insecurity: Patient Unable To Answer (06/15/2023)  Housing: Low Risk  (06/15/2023)  Transportation Needs: Patient Unable To Answer (06/15/2023)  Utilities: Patient Unable To Answer (06/15/2023)  Tobacco Use: Low Risk  (06/18/2023)    Readmission Risk Interventions     No data to display

## 2023-06-24 NOTE — Progress Notes (Addendum)
Patient Name: NORMON RADFORD Date of Encounter: 06/24/2023 [facility] Cardiologist: None   Interval Summary  .    No complaints, better rate control   Vital Signs .    Vitals:   06/23/23 1135 06/23/23 1522 06/23/23 1931 06/23/23 2338  BP: 96/67 (!) 125/106 109/68 92/62  Pulse: 99 (!) 113 (!) 104 82  Resp:   19 20  Temp: 99.5 F (37.5 C) 99.8 F (37.7 C) 99.1 F (37.3 C) 98.4 F (36.9 C)  TempSrc:   Oral Oral  SpO2: 100% 99% 97% 100%  Weight:      Height:        Intake/Output Summary (Last 24 hours) at 06/24/2023 1156 Last data filed at 06/24/2023 1141 Gross per 24 hour  Intake 746.26 ml  Output 300 ml  Net 446.26 ml      06/18/2023    9:42 AM 06/15/2023    3:56 PM 04/15/2023    8:40 PM  Last 3 Weights  Weight (lbs) 176 lb 9.4 oz 176 lb 9.4 oz 165 lb 5.5 oz  Weight (kg) 80.1 kg 80.1 kg 75 kg      Telemetry/ECG    Afib 90-100 - Personally Reviewed  CV Studies    Echocardiogram 06/16/2023  1. Severe asymmetric hypertrophy of the basal septum up to 16 mm. There  is mitral valve SAM with LVOT obstruction up to 41 mmHG. There is  moderate, centrally/posterior directed mitral valve regurgitation.  Findings are concerning for hypertrophic  cardiomyopathy. Left ventricular ejection fraction, by estimation, is 60  to 65%. The left ventricle has normal function. Left ventricular  endocardial border not optimally defined to evaluate regional wall motion.  There is severe asymmetric left  ventricular hypertrophy of the basal-septal segment. Left ventricular  diastolic function could not be evaluated.   2. Right ventricular systolic function is normal. The right ventricular  size is normal. There is normal pulmonary artery systolic pressure. The  estimated right ventricular systolic pressure is 23.1 mmHg.   3. Left atrial size was mildly dilated.   4. The mitral valve is grossly normal. Moderate mitral valve  regurgitation. No evidence of mitral  stenosis.   5. The aortic valve is tricuspid. There is mild calcification of the  aortic valve. Aortic valve regurgitation is not visualized. Aortic valve  sclerosis is present, with no evidence of aortic valve stenosis.   6. The inferior vena cava is normal in size with greater than 50%  respiratory variability, suggesting right atrial pressure of 3 mmHg. E  Physical Exam .   GEN: No acute distress.   Neck: No JVD Cardiac: Irregularly irregular Respiratory: Clear to auscultation bilaterally. GI: Soft, nontender, non-distended  MS: No edema  Assessment & Plan .     New onset atrial fibrillation Looks to have went into atrial fibrillation on 10/16 around 10-2 o'clock.  LA was mildly dilated.  Appears to be asymptomatic.  He is also likely tachycardic due to underlying infection as well as possible dehydration given hypernatremia, elevated BUN and recent diuresis.  His rates are reasonably controlled now since increasing his metop 90-100.  Continue Lopressor 100 mg twice daily to 3 times daily.  As he improves from his infection may require lower dose.  Can continue this for now.   Continue Eliquis 5 mg twice daily.  HR goal less than 110.  Can plan for outpatient cardioversion in 3 weeks if patient does not convert on his own, but may prefer rate control  strategies given cognitive deficits.  Normal TSH.   Hypertrophic cardiomyopathy since 2010 Moderate MR Does not appear this has been actively managed.  He has severe asymptomatic hypertrophy of the basal septum up to 16 mm.  Mitral valve SAM with LVOT 41 mmHg.  Normal RV function.  Appears to be euvolemic.  Was treated with IV Lasix however now sodium is trending up, diuretics have been stopped.  Continue beta-blocker as above.    CAD Per records has LAD disease with moderate to severe first diagonal disease. No aspirin with eliquis. His plavix has also been discontinued. Statin being held for transaminitis     CKD Dementia Hypernatremia, gently hydrate Sepsis secondary to perirectal abscess Per primary team.  On antibiotics  No labs today, will order BMP.  For questions or updates, please contact [facility] Please consult [email or web address] for contact info under        Signed, Abagail Kitchens, PA-C    I have seen and examined the patient along with Abagail Kitchens, PA-C  .  I have reviewed the chart, notes and new data.  I agree with PA/NP's note.  Key new complaints: no dyspnea/angina/dizziness Key examination changes: irregular rhythm, murmur a little less prominent Key new findings / data: no labs today (BUN and creat had been worsening for preceding 3 days)  PLAN: [facility] will sign off.   Medication Recommendations:   Continue metoprolol 100 mg 3 times daily (or 150 mg twice daily if more convenient) and eliquis 5 mg twice daily Other recommendations (labs, testing, etc):  recheck BMET Follow up as an outpatient:  will arrange follow up in 3-4 weeks.   Thurmon Fair, MD, [facility] [phone] 06/24/2023, 12:30 PM

## 2023-06-24 NOTE — Plan of Care (Signed)
Problem: Education: Goal: Ability to describe self-care measures that may prevent or decrease complications (Diabetes Survival Skills Education) will improve 06/24/2023 1833 by Charmayne Sheer, LPN Outcome: Progressing 06/24/2023 1401 by Charmayne Sheer, LPN Outcome: Not Progressing Goal: Individualized Educational Video(s) 06/24/2023 1833 by Charmayne Sheer, LPN Outcome: Progressing 06/24/2023 1401 by Charmayne Sheer, LPN Outcome: Not Progressing   Problem: Coping: Goal: Ability to adjust to condition or change in health will improve 06/24/2023 1833 by Charmayne Sheer, LPN Outcome: Progressing 06/24/2023 1401 by Charmayne Sheer, LPN Outcome: Not Progressing   Problem: Fluid Volume: Goal: Ability to maintain a balanced intake and output will improve 06/24/2023 1833 by Charmayne Sheer, LPN Outcome: Progressing 06/24/2023 1401 by Charmayne Sheer, LPN Outcome: Not Progressing   Problem: Health Behavior/Discharge Planning: Goal: Ability to identify and utilize available resources and services will improve 06/24/2023 1833 by Charmayne Sheer, LPN Outcome: Progressing 06/24/2023 1401 by Charmayne Sheer, LPN Outcome: Not Progressing Goal: Ability to manage health-related needs will improve 06/24/2023 1833 by Charmayne Sheer, LPN Outcome: Progressing 06/24/2023 1401 by Charmayne Sheer, LPN Outcome: Not Progressing   Problem: Metabolic: Goal: Ability to maintain appropriate glucose levels will improve 06/24/2023 1833 by Charmayne Sheer, LPN Outcome: Progressing 06/24/2023 1401 by Charmayne Sheer, LPN Outcome: Not Progressing   Problem: Nutritional: Goal: Maintenance of adequate nutrition will improve 06/24/2023 1833 by Charmayne Sheer, LPN Outcome: Progressing 06/24/2023 1401 by Charmayne Sheer, LPN Outcome: Not Progressing Goal: Progress toward achieving an optimal weight will improve 06/24/2023 1833 by Charmayne Sheer, LPN Outcome: Progressing 06/24/2023 1401 by Charmayne Sheer, LPN Outcome: Not  Progressing   Problem: Skin Integrity: Goal: Risk for impaired skin integrity will decrease 06/24/2023 1833 by Charmayne Sheer, LPN Outcome: Progressing 06/24/2023 1401 by Charmayne Sheer, LPN Outcome: Not Progressing   Problem: Tissue Perfusion: Goal: Adequacy of tissue perfusion will improve 06/24/2023 1833 by Charmayne Sheer, LPN Outcome: Progressing 06/24/2023 1401 by Charmayne Sheer, LPN Outcome: Not Progressing   Problem: Education: Goal: Knowledge of General Education information will improve Description: Including pain rating scale, medication(s)/side effects and non-pharmacologic comfort measures 06/24/2023 1833 by Charmayne Sheer, LPN Outcome: Progressing 06/24/2023 1401 by Charmayne Sheer, LPN Outcome: Not Progressing   Problem: Health Behavior/Discharge Planning: Goal: Ability to manage health-related needs will improve 06/24/2023 1833 by Charmayne Sheer, LPN Outcome: Progressing 06/24/2023 1401 by Charmayne Sheer, LPN Outcome: Not Progressing   Problem: Clinical Measurements: Goal: Ability to maintain clinical measurements within normal limits will improve 06/24/2023 1833 by Charmayne Sheer, LPN Outcome: Progressing 06/24/2023 1401 by Charmayne Sheer, LPN Outcome: Not Progressing Goal: Will remain free from infection 06/24/2023 1833 by Charmayne Sheer, LPN Outcome: Progressing 06/24/2023 1401 by Charmayne Sheer, LPN Outcome: Not Progressing Goal: Diagnostic test results will improve 06/24/2023 1833 by Charmayne Sheer, LPN Outcome: Progressing 06/24/2023 1401 by Charmayne Sheer, LPN Outcome: Not Progressing Goal: Respiratory complications will improve 06/24/2023 1833 by Charmayne Sheer, LPN Outcome: Progressing 06/24/2023 1401 by Charmayne Sheer, LPN Outcome: Not Progressing Goal: Cardiovascular complication will be avoided 06/24/2023 1833 by Charmayne Sheer, LPN Outcome: Progressing 06/24/2023 1401 by Charmayne Sheer, LPN Outcome: Not Progressing   Problem: Activity: Goal: Risk  for activity intolerance will decrease 06/24/2023 1833 by Charmayne Sheer, LPN Outcome: Progressing 06/24/2023 1401 by Charmayne Sheer, LPN Outcome: Not Progressing   Problem: Nutrition: Goal: Adequate nutrition will be maintained 06/24/2023 1833 by Charmayne Sheer, LPN Outcome: Progressing 06/24/2023 1401 by Charmayne Sheer, LPN Outcome: Not Progressing   Problem: Coping: Goal: Level of anxiety will decrease 06/24/2023 1833 by Charmayne Sheer, LPN Outcome: Progressing 06/24/2023 1401 by  Charmayne Sheer, LPN Outcome: Not Progressing   Problem: Elimination: Goal: Will not experience complications related to bowel motility 06/24/2023 1833 by Charmayne Sheer, LPN Outcome: Progressing 06/24/2023 1401 by Charmayne Sheer, LPN Outcome: Not Progressing Goal: Will not experience complications related to urinary retention 06/24/2023 1833 by Charmayne Sheer, LPN Outcome: Progressing 06/24/2023 1401 by Charmayne Sheer, LPN Outcome: Not Progressing   Problem: Pain Managment: Goal: General experience of comfort will improve 06/24/2023 1833 by Charmayne Sheer, LPN Outcome: Progressing 06/24/2023 1401 by Charmayne Sheer, LPN Outcome: Not Progressing   Problem: Safety: Goal: Ability to remain free from injury will improve 06/24/2023 1833 by Charmayne Sheer, LPN Outcome: Progressing 06/24/2023 1401 by Charmayne Sheer, LPN Outcome: Not Progressing   Problem: Skin Integrity: Goal: Risk for impaired skin integrity will decrease 06/24/2023 1833 by Charmayne Sheer, LPN Outcome: Progressing 06/24/2023 1401 by Charmayne Sheer, LPN Outcome: Not Progressing   Problem: Fluid Volume: Goal: Hemodynamic stability will improve 06/24/2023 1833 by Charmayne Sheer, LPN Outcome: Progressing 06/24/2023 1401 by Charmayne Sheer, LPN Outcome: Not Progressing   Problem: Clinical Measurements: Goal: Diagnostic test results will improve 06/24/2023 1833 by Charmayne Sheer, LPN Outcome: Progressing 06/24/2023 1401 by Charmayne Sheer,  LPN Outcome: Not Progressing Goal: Signs and symptoms of infection will decrease 06/24/2023 1833 by Charmayne Sheer, LPN Outcome: Progressing 06/24/2023 1401 by Charmayne Sheer, LPN Outcome: Not Progressing   Problem: Respiratory: Goal: Ability to maintain adequate ventilation will improve 06/24/2023 1833 by Charmayne Sheer, LPN Outcome: Progressing 06/24/2023 1401 by Charmayne Sheer, LPN Outcome: Not Progressing   Problem: Education: Goal: Ability to describe self-care measures that may prevent or decrease complications (Diabetes Survival Skills Education) will improve 06/24/2023 1833 by Charmayne Sheer, LPN Outcome: Progressing 06/24/2023 1401 by Charmayne Sheer, LPN Outcome: Not Progressing Goal: Individualized Educational Video(s) 06/24/2023 1833 by Charmayne Sheer, LPN Outcome: Progressing 06/24/2023 1401 by Charmayne Sheer, LPN Outcome: Not Progressing   Problem: Cardiac: Goal: Ability to maintain an adequate cardiac output will improve 06/24/2023 1833 by Charmayne Sheer, LPN Outcome: Progressing 06/24/2023 1401 by Charmayne Sheer, LPN Outcome: Not Progressing   Problem: Health Behavior/Discharge Planning: Goal: Ability to identify and utilize available resources and services will improve 06/24/2023 1833 by Charmayne Sheer, LPN Outcome: Progressing 06/24/2023 1401 by Charmayne Sheer, LPN Outcome: Not Progressing Goal: Ability to manage health-related needs will improve 06/24/2023 1833 by Charmayne Sheer, LPN Outcome: Progressing 06/24/2023 1401 by Charmayne Sheer, LPN Outcome: Not Progressing   Problem: Fluid Volume: Goal: Ability to achieve a balanced intake and output will improve 06/24/2023 1833 by Charmayne Sheer, LPN Outcome: Progressing 06/24/2023 1401 by Charmayne Sheer, LPN Outcome: Not Progressing   Problem: Metabolic: Goal: Ability to maintain appropriate glucose levels will improve 06/24/2023 1833 by Charmayne Sheer, LPN Outcome: Progressing 06/24/2023 1401 by Charmayne Sheer,  LPN Outcome: Not Progressing   Problem: Nutritional: Goal: Maintenance of adequate nutrition will improve 06/24/2023 1833 by Charmayne Sheer, LPN Outcome: Progressing 06/24/2023 1401 by Charmayne Sheer, LPN Outcome: Not Progressing Goal: Maintenance of adequate weight for body size and type will improve 06/24/2023 1833 by Charmayne Sheer, LPN Outcome: Progressing 06/24/2023 1401 by Charmayne Sheer, LPN Outcome: Not Progressing   Problem: Respiratory: Goal: Will regain and/or maintain adequate ventilation 06/24/2023 1833 by Charmayne Sheer, LPN Outcome: Progressing 06/24/2023 1401 by Charmayne Sheer, LPN Outcome: Not Progressing   Problem: Urinary Elimination: Goal: Ability to achieve and maintain adequate renal perfusion and functioning will improve 06/24/2023 1833 by Charmayne Sheer, LPN Outcome: Progressing 06/24/2023 1401 by Charmayne Sheer, LPN Outcome: Not Progressing   Problem: Education:  Goal: Knowledge of disease or condition will improve Outcome: Progressing Goal: Knowledge of secondary prevention will improve (MUST DOCUMENT ALL) Outcome: Progressing Goal: Knowledge of patient specific risk factors will improve Loraine Leriche N/A or DELETE if not current risk factor) Outcome: Progressing   Problem: Ischemic Stroke/TIA Tissue Perfusion: Goal: Complications of ischemic stroke/TIA will be minimized Outcome: Progressing

## 2023-06-24 NOTE — Progress Notes (Signed)
Speech Language Pathology Treatment: (P) Dysphagia  Patient Details Name: Eric Zamora MRN: [id number] DOB: [birth year] Today's Date: 06/24/2023 Time: (P) 0830-(P) 0849 SLP Time Calculation (min) (ACUTE ONLY): (P) 19 min  Assessment / Plan / Recommendation Clinical Impression  Patient seen to address dysphagia goals.  This morning he is sleeping but did awaken adequately for some p.o. intake and advised that he was thirsty.  Upon review of the MBS study found that he did audibly aspirate which allows clinical evaluation for dietary advancement.  SLP provided patient with ice cream, and Ensure that is approximately nectar thick with patient demonstrating delayed swallow but no clinical indication of aspiration.  Anticipate patient will tolerate nectar thick liquids with consistently improved mentation and thus with improved hydration.  Will advance to nectar thick liquid with strict precautions.  Patient does appear with slight coating on his tongue that did not clear but he did not indicate discomfort.  He is oriented to hospital approximately 10 minutes after being informed and he is able to name his nurse from the facility as well as facility name.  HPI HPI: 77 yo male adm with AMS, foul urine odor and dyspnea.  Pt is a resident of [address].  He experienced an aspiration episode yesterday with medications with applesauce and then experienced fever and respiratory difficulties requiring Bipap. Pt has h/o falls, dementia, Afib, CHF. CXR  06/17/2023 Patchy opacities throughout the lungs bilaterally, predominantly dependent, which are concerning for aspiration pneumonia/pneumonitis given the clinical concern for aspiration. 2. Small bilateral pleural effusions (right greater than left). 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 MBS completed on 10/19. Patient on dys 1 (puree) and honey thick liquids.      SLP Plan  (P) Continue with current plan of  care      Recommendations for follow up therapy are one component of a multi-disciplinary discharge planning process, led by the attending physician.  Recommendations may be updated based on patient status, additional functional criteria and insurance authorization.    Recommendations  Diet recommendations: (P) Dysphagia 1 (puree);Nectar-thick liquid Liquids provided via: (P) Teaspoon;Cup;No straw Medication Administration: (P)  (May try whole in pure, but crush if patient cannot transit) Supervision: (P) Full supervision/cueing for compensatory strategies Compensations: (P) Small sips/bites;Slow rate;Minimize environmental distractions Postural Changes and/or Swallow Maneuvers: (P) Seated upright 90 degrees;Upright 30-60 min after meal                  (P) Oral care BID   (P) Frequent or constant Supervision/Assistance (P) Dysphagia, oropharyngeal phase (R13.12)     (P) Continue with current plan of care    [name], MS [facility] SLP Acute Rehab Services Office [phone]  [name]  06/24/2023, 10:55 AM

## 2023-06-24 NOTE — Plan of Care (Signed)
  Problem: Fluid Volume: Goal: Ability to maintain a balanced intake and output will improve Outcome: Progressing   Problem: Health Behavior/Discharge Planning: Goal: Ability to identify and utilize available resources and services will improve Outcome: Progressing   Problem: Nutritional: Goal: Maintenance of adequate nutrition will improve Outcome: Progressing   Problem: Tissue Perfusion: Goal: Adequacy of tissue perfusion will improve Outcome: Progressing   Problem: Clinical Measurements: Goal: Respiratory complications will improve Outcome: Progressing   Problem: Coping: Goal: Level of anxiety will decrease Outcome: Progressing

## 2023-06-24 NOTE — Progress Notes (Signed)
Pt seen for dysphagia management - he audibly aspirated on the MBS - and today he is managing nectar liquids and icecream without cough but ongoing delay.  At this time, will advance diet to nectar liquids to maximize hydration.  Full report to follow.    [name], MS [facility] SLP Acute [employer] [phone]

## 2023-06-24 NOTE — Plan of Care (Signed)
Problem: Education: Goal: Ability to describe self-care measures that may prevent or decrease complications (Diabetes Survival Skills Education) will improve Outcome: Not Progressing Goal: Individualized Educational Video(s) Outcome: Not Progressing   Problem: Coping: Goal: Ability to adjust to condition or change in health will improve Outcome: Not Progressing   Problem: Fluid Volume: Goal: Ability to maintain a balanced intake and output will improve Outcome: Not Progressing   Problem: Health Behavior/Discharge Planning: Goal: Ability to identify and utilize available resources and services will improve Outcome: Not Progressing Goal: Ability to manage health-related needs will improve Outcome: Not Progressing   Problem: Metabolic: Goal: Ability to maintain appropriate glucose levels will improve Outcome: Not Progressing   Problem: Nutritional: Goal: Maintenance of adequate nutrition will improve Outcome: Not Progressing Goal: Progress toward achieving an optimal weight will improve Outcome: Not Progressing   Problem: Skin Integrity: Goal: Risk for impaired skin integrity will decrease Outcome: Not Progressing   Problem: Tissue Perfusion: Goal: Adequacy of tissue perfusion will improve Outcome: Not Progressing   Problem: Education: Goal: Knowledge of General Education information will improve Description: Including pain rating scale, medication(s)/side effects and non-pharmacologic comfort measures Outcome: Not Progressing   Problem: Health Behavior/Discharge Planning: Goal: Ability to manage health-related needs will improve Outcome: Not Progressing   Problem: Clinical Measurements: Goal: Ability to maintain clinical measurements within normal limits will improve Outcome: Not Progressing Goal: Will remain free from infection Outcome: Not Progressing Goal: Diagnostic test results will improve Outcome: Not Progressing Goal: Respiratory complications will  improve Outcome: Not Progressing Goal: Cardiovascular complication will be avoided Outcome: Not Progressing   Problem: Activity: Goal: Risk for activity intolerance will decrease Outcome: Not Progressing   Problem: Nutrition: Goal: Adequate nutrition will be maintained Outcome: Not Progressing   Problem: Coping: Goal: Level of anxiety will decrease Outcome: Not Progressing   Problem: Elimination: Goal: Will not experience complications related to bowel motility Outcome: Not Progressing Goal: Will not experience complications related to urinary retention Outcome: Not Progressing   Problem: Pain Managment: Goal: General experience of comfort will improve Outcome: Not Progressing   Problem: Safety: Goal: Ability to remain free from injury will improve Outcome: Not Progressing   Problem: Skin Integrity: Goal: Risk for impaired skin integrity will decrease Outcome: Not Progressing   Problem: Fluid Volume: Goal: Hemodynamic stability will improve Outcome: Not Progressing   Problem: Clinical Measurements: Goal: Diagnostic test results will improve Outcome: Not Progressing Goal: Signs and symptoms of infection will decrease Outcome: Not Progressing   Problem: Respiratory: Goal: Ability to maintain adequate ventilation will improve Outcome: Not Progressing   Problem: Education: Goal: Ability to describe self-care measures that may prevent or decrease complications (Diabetes Survival Skills Education) will improve Outcome: Not Progressing Goal: Individualized Educational Video(s) Outcome: Not Progressing   Problem: Cardiac: Goal: Ability to maintain an adequate cardiac output will improve Outcome: Not Progressing   Problem: Health Behavior/Discharge Planning: Goal: Ability to identify and utilize available resources and services will improve Outcome: Not Progressing Goal: Ability to manage health-related needs will improve Outcome: Not Progressing   Problem:  Fluid Volume: Goal: Ability to achieve a balanced intake and output will improve Outcome: Not Progressing   Problem: Metabolic: Goal: Ability to maintain appropriate glucose levels will improve Outcome: Not Progressing   Problem: Nutritional: Goal: Maintenance of adequate nutrition will improve Outcome: Not Progressing Goal: Maintenance of adequate weight for body size and type will improve Outcome: Not Progressing   Problem: Respiratory: Goal: Will regain and/or maintain adequate ventilation Outcome: Not Progressing  Problem: Urinary Elimination: Goal: Ability to achieve and maintain adequate renal perfusion and functioning will improve Outcome: Not Progressing

## 2023-06-24 NOTE — Consult Note (Addendum)
[facility] Nephrology Consultation Note  Requesting MD: Dr. Isidoro Donning, Ripudeep Reason for consult: AKI and hypernatremia  HPI:  Eric Zamora is a 77 y.o. male with past medical history significant for hypertension, HLD, bipolar disorder, dementia, DVT, CAD, CHF, CKD 3B with baseline creatinine level around 1.5 was initially presented from SNF with tachypnea, fever found to have sepsis due to perirectal abscess, seen as a consultation for the evaluation and management of AKI on CKD and hyponatremia. The CT scan showed perirectal abscess therefore general surgery was consulted and underwent I&D of the abscess.  Patient required insulin drip, medication adjustment for A-fib with RVR, BiPAP use etc. during hospitalization.  The initial lab showed serum sodium level of 142, CO2 18, creatinine level 1.52.  Patient was treated with IV diuretics/Lasix with uptrending of creatinine level to 2.06 and sodium level of 152.  The sodium was discontinued and treated with LR bolus and then dextrose fluid.  He remains confused.  The CT scan done today showed acute stroke.  Unable to obtain review of system from the patient.  He is clearly not able to eat or swallow. He is currently on Augmentin, Eliquis, doxycycline, metoprolol.  In the process of completing LR bolus. Unable to obtain review of system from the patient.  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 No pertinent family history.  Social History:  reports that he has never smoked. He has never used smokeless tobacco. He reports that he does not currently use drugs. He reports that he does not drink alcohol.  Allergies: No Known Allergies  Medications: Prior to Admission medications   Medication Sig Start Date End Date Taking? Authorizing Provider  clopidogrel (PLAVIX) 75 MG tablet Take 75 mg by mouth daily. 05/01/21  Yes [provider]  ergocalciferol (VITAMIN D2) 1.25 MG (50000 UT) capsule Take 50,000 Units by mouth every 30 (thirty) days. On the 7 th of every month.   Yes [provider]  furosemide (LASIX) 40 MG tablet Take 1 tablet (40 mg total) by mouth daily. 07/10/13  Yes Rai, Ripudeep K, MD  latanoprost (XALATAN) 0.005 % ophthalmic solution Place 1 drop into both eyes at bedtime. 06/03/23  Yes [provider]  levETIRAcetam (KEPPRA) 500 MG tablet Take 500 mg by mouth 2 (two) times daily. 05/01/21  Yes [provider]  metoprolol tartrate (LOPRESSOR) 25 MG tablet Take 1 tablet (25 mg total) by mouth 2 (two) times daily. 07/10/13  Yes Rai, Ripudeep K, MD  PARoxetine (PAXIL) 10 MG tablet Take 10 mg by mouth daily. 05/01/21  Yes [provider]  potassium chloride SA (K-DUR,KLOR-CON) 20 MEQ tablet  Take 2 tablets (40 mEq total) by mouth daily. 07/10/13  Yes Rai, Ripudeep K, MD  simvastatin (ZOCOR) 20 MG tablet Take 20 mg by mouth at bedtime.   Yes [provider]    I have reviewed the patient's current medications.  Labs: Renal Panel: Recent Labs  Lab 06/20/23 0302 06/21/23 0544 06/22/23 0613 06/23/23 0544 06/24/23 1245  NA 145 144 149* 151* 152*  K  3.7 3.9 3.5 3.8 3.8  CL 119* 116* 118* 123* 123*  CO2 20* 17* 22 20* 20*  GLUCOSE 91 115* 146* 162* 217*  BUN 26* 22 35* 41* 42*  CREATININE 1.44* 1.50* 1.84* 1.99* 2.06*  CALCIUM 7.8* 7.8* 8.3* 8.7* 8.6*     CBC:    Latest Ref Rng & Units 06/23/2023    5:44 AM 06/22/2023    6:13 AM 06/21/2023    5:44 AM  CBC  WBC 4.0 - 10.5 K/uL 15.6  15.6  10.9   Hemoglobin 13.0 - 17.0 g/dL 13.0  86.5  78.4   Hematocrit 39.0 - 52.0 % 42.9  40.4  37.9   Platelets 150 - 400 K/uL 364  369  273      Anemia Panel:  Recent Labs    06/19/23 0415 06/20/23 0302 06/21/23 0544 06/22/23 0613 06/23/23 0544  HGB 10.8* 11.5* 12.5* 13.1 14.0  MCV 92.8 92.4 87.1 87.4 89.7    Recent Labs  Lab 06/18/23 0524 06/19/23 0415 06/20/23 0302 06/21/23 0544 06/22/23 0613 06/23/23 0544 06/24/23 1245  AST 299* 258* 152* 84* 87* 69* 73*  ALT 186* 218* 206* 142* 127* 102* 85*  ALKPHOS 44 36* 34* 34* 36* 34* 32*  BILITOT 0.8 0.7 0.5 0.7 0.6 0.7 0.6  PROT 6.6 6.1* 6.5 6.5 7.5 7.7 7.4  ALBUMIN 2.6* 2.3* 2.4* 2.4* 2.5* 2.6* 2.7*    Lab Results  Component Value Date   HGBA1C 9.4 (H) 06/15/2023    ROS: Patient is confused and having encephalopathy therefore unable to obtain review of system.  Physical Exam: Vitals:   06/23/23 2338 06/24/23 1204  BP: 92/62 (!) 101/56  Pulse: 82 79  Resp: 20 16  Temp: 98.4 F (36.9 C) 98 F (36.7 C)  SpO2: 100% 98%     General exam: Chronically ill looking male, confused and altered. Respiratory system: Clear to auscultation. Respiratory effort normal. No wheezing or crackle Cardiovascular system: S1 & S2 heard, RRR.  No pedal edema. Gastrointestinal system: Abdomen is nondistended, soft and nontender. Normal bowel sounds heard. Central nervous system: Confused male.   Extremities: No leg edema.  No cyanosis Skin: No rashes, lesions or ulcers Psychiatry: Unable to assess because of confusion.  Assessment/Plan:  # Acute kidney injury on CKD stage IIIb  likely hemodynamically mediated due recent use of diuretics/ intravascular volume depletion and sepsis.  Recent CT scan ruled out hydronephrosis.  I agree with IV fluid.  Changing to hypotonic fluid because of hypernatremia. Strict ins and out and lab in the morning. Avoid nephrotoxins.  # Hypernatremia, hypovolemic: Hypernatremia worsening because of Lasix use.  He is off of Lasix.  Currently receiving LR bolus, switching fluid to D5W.  Monitor lab.  # Acute stroke/acute metabolic encephalopathy: CT head reviewed.  Correcting metabolic abnormalities, hydration.  Looks overall poor prognosis.  # Severe sepsis due to perirectal abscess status post I&D by general surgery.  Currently on Augmentin and doxycycline.  # A-fib with RVR: Seen by cardiologist.  On metoprolol.  Monitor BP and heart rate.  Thank you  for the consult, we will continue to follow.   Kevyn Wengert Jaynie Collins 06/24/2023, 2:47 PM  [facility].

## 2023-06-24 NOTE — Progress Notes (Addendum)
[facility]                                                                              [place], is a 77 y.o. male, DOB - 06-24-1946, WNU:[id number] Admit date - 06/15/2023    Outpatient Primary MD for the patient is Kurth-Bowen, Cornelia, PA-C  LOS - 9  days  Chief Complaint  Patient presents with   Altered Mental Status       Brief summary   Mr. Paratore is a 77 y.o. 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  Significant events: 10/15: Sent from SNF for tachpynea, found to have fever, no localizing source, admitted on antibiotics 10/17: Overnight with fever, aspiration event, new AG acidosis, transferred back to SDU for BiPAP and insulin drip 10/18: CT shows perirectal abscess, general surgery consulted; taken to OR for drainage of abscess 10/19: SLP advanced to Dys 1 diet, honey thick 10/20 to 10/22: Titrating up metoprolol for atrial fibrillation  Assessment & Plan    Principal Problem:   Severe sepsis due to perirectal abscess(HCC) -Presented with fever, leukocytosis, AKI, reported encephalopathy and hypotension.  UTI, pneumonia ruled out. -CT chest abdomen pelvis showed perirectal abscess  -General Surgery was consulted, status post I&D by Dr. Derrell Lolling on 06/18/23 -Continue Augmentin, doxycycline for 14 days - Penrose drain removed, surgery following, needs local wound care, sitz bath   Active problems Atrial fibrillation with RVR, new onset -New onset.  CHA2DS2-Vasc 6 (age, HTN, CHF, DM, vascular disease).  TSH normal.  Echo shows HOCM (previously known).  Normal valves. -Continue apixaban, Plavix discontinued as now on anticoagulation -Cardiology following, recommended Lopressor 100 mg 3 times daily.  Outpatient cardioversion in 3 weeks if does not convert to NSR on his  own. -TSH normal  Hypertrophic cardiomyopathy, moderate MR -Initially treated with IV Lasix however now sodium has been trending up, diuretics on hold -Continue beta-blocker   Transaminitis -Possibly due to shock liver, from severe sepsis, hypotension.   - CT and US imaging of biliary tree are normal, no RUQ localizing symptoms.   -Follow LFTs   Dysphagia Aspirated with speech therapy 10/17.  MBS showed aspiration.   -SLP consulted, continue dysphagia 1 diet  -Started on nystatin S&S for oral thrush   Dementia Acute metabolic encephalopathy At baseline, patient is ambulatory without assistance, participates in cares, and is interactive, but has severe short term memory loss.  Suspect encephalopathy from infection, although it is slow to improve. - Standard delirium precautions - Hold Keppra and sedating medicines, slowly improving Addendum:  CT haed done for worsening lethargy showed acute/subacute cortical and subcortical right MCA territory infarct centered within the right frontal operculum/right insula,measuring 5.2 x 3.2 cm, petechial hemorrhage.  - neurology consulted, d/w Dr Rollene Fare, will await recommendations    Acute renal failure superimposed on stage 3b chronic kidney disease (HCC) Cr 1.99 on admission from baseline ~1.3.  Improved with fluids. - Creatinine trending up, await bmet today -Lasix  discontinued Addendum - Na 152, Cr 2.06, discussed with nephrology, recommended Ringer lactate and increase D5, will see today   Acute on chronic diastolic CHF Developed peripheral edema, hypoxia.  Treated with IV Lasix, now Na trending up.   - Hold furosemide for now   Hypernatremia -Sodium continues to trend up, 151 with known anion gap metabolic acidosis -Lasix on hold, placed on D5 water at 75 cc an hour -Recheck BMET today   CAD- Moderate LAD disease with moderate to severe ostial first diagonal disease by cath 07/2013 - not ammendable to PCI. Medical therapy Mixed  hyperlipidemia - Continue metoprolol - Simvastatin on hold due to transaminitis - Aspirin, Plavix discontinued as patient is now on anticoagulation with Eliquis    Essential hypertension -Continue metoprolol   Bipolar disorder (manic depression) (HCC) Not on meds for this at home, no active symptoms   On antiepileptic therapy Patient not reliable historian.  Unclear reason for this med. Not listed in med list at 2022 Neuro appointment.  Maybe this is offlabel treatment for Bipolar d/o? - Resume Keppra when mentation improves    Uncontrolled type 2 diabetes mellitus with hyperglycemia, without long-term current use of insulin (HCC) No prior records, not on antiglycemic medications at home.  A1c 9.4%  CBG (last 3)  Recent Labs    06/24/23 0400 06/24/23 0720 06/24/23 1124  GLUCAP 121* 144* 170*   -Continue Semglee, 22 units daily, NovoLog meal coverage 3 units 3 times daily AC, moderate SSI   Diabetic ketoacidosis without coma associated with type 2 diabetes mellitus (HCC) This developed overnight 10/17.  Was on insulin drip for about 24 hours, transition back to subcutaneous insulin 10/17 in the evening      Estimated body mass index is 29.39 kg/m as calculated from the following:   Height as of this encounter: 5\' 5"  (1.651 m).   Weight as of this encounter: 80.1 kg.  Code Status: full  DVT Prophylaxis:  SCDs Start: 06/15/23 1459 apixaban (ELIQUIS) tablet 5 mg   Level of Care: Level of care: Telemetry Family Communication: Updated patient Disposition Plan:      Remains inpatient appropriate:   Awaiting SNF.   Procedures:    Consultants:   Cardiology General Surgery  Antimicrobials:   Anti-infectives (From admission, onward)    Start     Dose/Rate Route Frequency Ordered Stop   06/21/23 1215  doxycycline (VIBRA-TABS) tablet 100 mg        100 mg Oral Every 12 hours 06/21/23 1128 07/05/23 0959   06/21/23 1215  amoxicillin-clavulanate (AUGMENTIN) 875-125 MG per  tablet 1 tablet        1 tablet Oral Every 12 hours 06/21/23 1128 07/05/23 0959   06/20/23 1200  vancomycin (VANCOREADY) IVPB 1500 mg/300 mL  Status:  Discontinued        1,500 mg 150 mL/hr over 120 Minutes Intravenous Every 36 hours 06/20/23 0719 06/21/23 1128   06/19/23 0000  vancomycin (VANCOREADY) IVPB 1250 mg/250 mL  Status:  Discontinued        1,250 mg 166.7 mL/hr over 90 Minutes Intravenous Every 36 hours 06/18/23 0939 06/20/23 0719   06/18/23 1800  ceFEPIme (MAXIPIME) 2 g in sodium chloride 0.9 % 100 mL IVPB  Status:  Discontinued        2 g 200 mL/hr over 30 Minutes Intravenous Every 12 hours 06/18/23 0939 06/21/23 1128   06/18/23 0600  ceFEPIme (MAXIPIME) 2 g in sodium chloride 0.9 % 100 mL IVPB  Status:  Discontinued        2 g 200 mL/hr over 30 Minutes Intravenous Every 24 hours 06/17/23 0920 06/18/23 0939   06/17/23 1400  vancomycin (VANCOCIN) IVPB 1000 mg/200 mL premix  Status:  Discontinued        1,000 mg 200 mL/hr over 60 Minutes Intravenous Every 36 hours 06/17/23 0920 06/18/23 0939   06/16/23 1800  vancomycin (VANCOREADY) IVPB 750 mg/150 mL  Status:  Discontinued        750 mg 150 mL/hr over 60 Minutes Intravenous Every 24 hours 06/15/23 1639 06/17/23 0920   06/16/23 1100  cefTRIAXone (ROCEPHIN) 2 g in sodium chloride 0.9 % 100 mL IVPB  Status:  Discontinued        2 g 200 mL/hr over 30 Minutes Intravenous Every 24 hours 06/15/23 1453 06/15/23 1455   06/15/23 1800  ceFEPIme (MAXIPIME) 2 g in sodium chloride 0.9 % 100 mL IVPB  Status:  Discontinued        2 g 200 mL/hr over 30 Minutes Intravenous Every 12 hours 06/15/23 1558 06/17/23 0920   06/15/23 1700  metroNIDAZOLE (FLAGYL) IVPB 500 mg  Status:  Discontinued        500 mg 100 mL/hr over 60 Minutes Intravenous Every 12 hours 06/15/23 1455 06/21/23 1128   06/15/23 1630  vancomycin (VANCOREADY) IVPB 1500 mg/300 mL        1,500 mg 150 mL/hr over 120 Minutes Intravenous  Once 06/15/23 1558 06/15/23 2058   06/15/23  1045  cefTRIAXone (ROCEPHIN) 2 g in sodium chloride 0.9 % 100 mL IVPB        2 g 200 mL/hr over 30 Minutes Intravenous  Once 06/15/23 1041 06/15/23 1155          Medications  amoxicillin-clavulanate  1 tablet Oral Q12H   apixaban  5 mg Oral BID   doxycycline  100 mg Oral Q12H   feeding supplement  237 mL Oral BID BM   insulin aspart  0-15 Units Subcutaneous TID WC   insulin aspart  0-5 Units Subcutaneous QHS   insulin aspart  3 Units Subcutaneous TID WC   insulin glargine-yfgn  22 Units Subcutaneous Q24H   metoprolol tartrate  100 mg Oral TID   nystatin  5 mL Oral QID   mouth rinse  15 mL Mouth Rinse 4 times per day   sodium chloride flush  3 mL Intravenous Q12H      Subjective:   Benajmin Dunham was seen and examined today.  Heart rate improving, no acute chest pain, shortness of breath, nausea or vomiting.  No fevers . Objective:   Vitals:   06/23/23 1522 06/23/23 1931 06/23/23 2338 06/24/23 1204  BP: (!) 125/106 109/68 92/62 (!) 101/56  Pulse: (!) 113 (!) 104 82 79  Resp:  19 20 16   Temp: 99.8 F (37.7 C) 99.1 F (37.3 C) 98.4 F (36.9 C) 98 F (36.7 C)  TempSrc:  Oral Oral   SpO2: 99% 97% 100% 98%  Weight:      Height:        Intake/Output Summary (Last 24 hours) at 06/24/2023 1218 Last data filed at 06/24/2023 1141 Gross per 24 hour  Intake 746.26 ml  Output 300 ml  Net 446.26 ml     Wt Readings from Last 3 Encounters:  06/18/23 80.1 kg  04/15/23 75 kg  11/05/21 75 kg    Physical Exam General: Alert and oriented x 3, NAD Cardiovascular: Irregular Respiratory: CTAB, no wheezing Gastrointestinal: Soft, nontender, nondistended,  NBS Ext: no pedal edema bilaterally Neuro: no new deficits Psych: Normal affect     Data Reviewed:  I have personally reviewed following labs    CBC Lab Results  Component Value Date   WBC 15.6 (H) 06/23/2023   RBC 4.78 06/23/2023   HGB 14.0 06/23/2023   HCT 42.9 06/23/2023   MCV 89.7 06/23/2023   MCH 29.3  06/23/2023   PLT 364 06/23/2023   MCHC 32.6 06/23/2023   RDW 14.1 06/23/2023   LYMPHSABS 1.5 06/15/2023   MONOABS 3.2 (H) 06/15/2023   EOSABS 0.0 06/15/2023   BASOSABS 0.1 06/15/2023     Last metabolic panel Lab Results  Component Value Date   NA 151 (H) 06/23/2023   K 3.8 06/23/2023   CL 123 (H) 06/23/2023   CO2 20 (L) 06/23/2023   BUN 41 (H) 06/23/2023   CREATININE 1.99 (H) 06/23/2023   GLUCOSE 162 (H) 06/23/2023   GFRNONAA 34 (L) 06/23/2023   GFRAA 50 (L) 04/12/2020   CALCIUM 8.7 (L) 06/23/2023   PHOS 3.3 08/03/2008   PROT 7.7 06/23/2023   ALBUMIN 2.6 (L) 06/23/2023   BILITOT 0.7 06/23/2023   ALKPHOS 34 (L) 06/23/2023   AST 69 (H) 06/23/2023   ALT 102 (H) 06/23/2023   ANIONGAP 8 06/23/2023    CBG (last 3)  Recent Labs    06/24/23 0400 06/24/23 0720 06/24/23 1124  GLUCAP 121* 144* 170*      Coagulation Profile: No results for input(s): "INR", "PROTIME" in the last 168 hours.   Radiology Studies: I have personally reviewed the imaging studies  US Abdomen Limited RUQ (LIVER/GB)  Result Date: 06/22/2023 CLINICAL DATA:  Elevated LFTs EXAM: ULTRASOUND ABDOMEN LIMITED RIGHT UPPER QUADRANT COMPARISON:  06/17/2023 CT FINDINGS: Gallbladder: No gallstones or wall thickening visualized. No sonographic Murphy sign noted by sonographer. Common bile duct: Not well visualized Liver: No focal lesion identified. Within normal limits in parenchymal echogenicity. Portal vein is patent on color Doppler imaging with normal direction of blood flow towards the liver. Other: None. IMPRESSION: No acute abnormality noted. Electronically Signed   By: Alcide Clever M.D.   On: 06/22/2023 19:28       Fauna Neuner M.D. [facility] 06/24/2023, 12:18 PM  Available via Epic secure chat 7am-7pm After 7 pm, please refer to night coverage provider listed on amion.

## 2023-06-25 ENCOUNTER — Inpatient Hospital Stay (HOSPITAL_COMMUNITY): Payer: Medicare (Managed Care)

## 2023-06-25 ENCOUNTER — Inpatient Hospital Stay (HOSPITAL_COMMUNITY)
Admit: 2023-06-25 | Discharge: 2023-06-25 | Disposition: A | Discharge status: Home / Self Care | Payer: Medicare (Managed Care) | Attending: Neurology | Admitting: Neurology

## 2023-06-25 ENCOUNTER — Other Ambulatory Visit (HOSPITAL_COMMUNITY): Payer: 59

## 2023-06-25 DIAGNOSIS — N179 Acute kidney failure, unspecified: Secondary | ICD-10-CM | POA: Diagnosis not present

## 2023-06-25 DIAGNOSIS — I4891 Unspecified atrial fibrillation: Secondary | ICD-10-CM | POA: Diagnosis not present

## 2023-06-25 DIAGNOSIS — I63511 Cerebral infarction due to unspecified occlusion or stenosis of right middle cerebral artery: Secondary | ICD-10-CM

## 2023-06-25 DIAGNOSIS — F319 Bipolar disorder, unspecified: Secondary | ICD-10-CM | POA: Diagnosis not present

## 2023-06-25 DIAGNOSIS — I639 Cerebral infarction, unspecified: Secondary | ICD-10-CM

## 2023-06-25 DIAGNOSIS — Z515 Encounter for palliative care: Secondary | ICD-10-CM

## 2023-06-25 DIAGNOSIS — R569 Unspecified convulsions: Secondary | ICD-10-CM | POA: Diagnosis not present

## 2023-06-25 DIAGNOSIS — R4182 Altered mental status, unspecified: Secondary | ICD-10-CM | POA: Diagnosis not present

## 2023-06-25 DIAGNOSIS — E1110 Type 2 diabetes mellitus with ketoacidosis without coma: Secondary | ICD-10-CM | POA: Diagnosis not present

## 2023-06-25 DIAGNOSIS — Z7189 Other specified counseling: Secondary | ICD-10-CM

## 2023-06-25 DIAGNOSIS — A419 Sepsis, unspecified organism: Secondary | ICD-10-CM | POA: Diagnosis not present

## 2023-06-25 DIAGNOSIS — R7401 Elevation of levels of liver transaminase levels: Secondary | ICD-10-CM

## 2023-06-25 LAB — CBC
HCT: 44.5 % (ref 39.0–52.0)
Hemoglobin: 13.8 g/dL (ref 13.0–17.0)
MCH: 29 pg (ref 26.0–34.0)
MCHC: 31 g/dL (ref 30.0–36.0)
MCV: 93.5 fL (ref 80.0–100.0)
Platelets: 311 10*3/uL (ref 150–400)
RBC: 4.76 MIL/uL (ref 4.22–5.81)
RDW: 14.5 % (ref 11.5–15.5)
WBC: 10.8 10*3/uL — ABNORMAL HIGH (ref 4.0–10.5)
nRBC: 0 % (ref 0.0–0.2)

## 2023-06-25 LAB — BASIC METABOLIC PANEL
Anion gap: 9 (ref 5–15)
BUN: 36 mg/dL — ABNORMAL HIGH (ref 8–23)
CO2: 19 mmol/L — ABNORMAL LOW (ref 22–32)
Calcium: 8.6 mg/dL — ABNORMAL LOW (ref 8.9–10.3)
Chloride: 123 mmol/L — ABNORMAL HIGH (ref 98–111)
Creatinine, Ser: 1.71 mg/dL — ABNORMAL HIGH (ref 0.61–1.24)
GFR, Estimated: 40 mL/min — ABNORMAL LOW (ref >60)
Glucose, Bld: 104 mg/dL — ABNORMAL HIGH (ref 70–99)
Potassium: 3.8 mmol/L (ref 3.5–5.1)
Sodium: 151 mmol/L — ABNORMAL HIGH (ref 135–145)

## 2023-06-25 LAB — VITAMIN B12: Vitamin B-12: 1043 pg/mL — ABNORMAL HIGH (ref 180–914)

## 2023-06-25 LAB — RPR
RPR Ser Ql: REACTIVE — AB
RPR Titer: 1:2 {titer}

## 2023-06-25 LAB — GLUCOSE, CAPILLARY
Glucose-Capillary: 171 mg/dL — ABNORMAL HIGH (ref 70–99)
Glucose-Capillary: 154 mg/dL — ABNORMAL HIGH (ref 70–99)
Glucose-Capillary: 134 mg/dL — ABNORMAL HIGH (ref 70–99)
Glucose-Capillary: 157 mg/dL — ABNORMAL HIGH (ref 70–99)

## 2023-06-25 LAB — LIPID PANEL
Cholesterol: 125 mg/dL (ref 0–200)
HDL: 24 mg/dL — ABNORMAL LOW (ref >40)
LDL Cholesterol: 70 mg/dL (ref 0–99)
Total CHOL/HDL Ratio: 5.2 {ratio}
Triglycerides: 153 mg/dL — ABNORMAL HIGH (ref <150)
VLDL: 31 mg/dL (ref 0–40)

## 2023-06-25 LAB — AMMONIA: Ammonia: 23 umol/L (ref 9–35)

## 2023-06-25 LAB — HIV ANTIBODY (ROUTINE TESTING W REFLEX): HIV Screen 4th Generation wRfx: NONREACTIVE

## 2023-06-25 MED ORDER — DEXTROSE-SODIUM CHLORIDE 5-0.45 % IV SOLN: INTRAVENOUS | Status: DC

## 2023-06-25 NOTE — Progress Notes (Signed)
EEG complete - results pending 

## 2023-06-25 NOTE — Progress Notes (Signed)
C)  TempSrc:  Oral Oral   SpO2: 98% 98% 99% 99%  Weight:      Height:        Intake/Output Summary (Last 24 hours) at 06/25/2023 1258 Last data filed at 06/25/2023 1004 Gross per 24  hour  Intake 1652.61 ml  Output 425 ml  Net 1227.61 ml     Wt Readings from Last 3 Encounters:  06/18/23 80.1 kg  04/15/23 75 kg  11/05/21 75 kg   Physical Exam General: Alert and, pleasant, following commands Cardiovascular: Irregular Respiratory: CTAB, no wheezing Gastrointestinal: Soft, nontender, nondistended, NBS Ext: no pedal edema bilaterally Neuro: bilateral LE  5/5, subtle weakness on the left upper extremity 4/5.  RUE 5/5 Psych: Normal affect     Data Reviewed:  I have personally reviewed following labs    CBC Lab Results  Component Value Date   WBC 10.8 (H) 06/25/2023   RBC 4.76 06/25/2023   HGB 13.8 06/25/2023   HCT 44.5 06/25/2023   MCV 93.5 06/25/2023   MCH 29.0 06/25/2023   PLT 311 06/25/2023   MCHC 31.0 06/25/2023   RDW 14.5 06/25/2023   LYMPHSABS 1.5 06/15/2023   MONOABS 3.2 (H) 06/15/2023   EOSABS 0.0 06/15/2023   BASOSABS 0.1 06/15/2023     Last metabolic panel Lab Results  Component Value Date   NA 151 (H) 06/25/2023   K 3.8 06/25/2023   CL 123 (H) 06/25/2023   CO2 19 (L) 06/25/2023   BUN 36 (H) 06/25/2023   CREATININE 1.71 (H) 06/25/2023   GLUCOSE 104 (H) 06/25/2023   GFRNONAA 40 (L) 06/25/2023   GFRAA 50 (L) 04/12/2020   CALCIUM 8.6 (L) 06/25/2023   PHOS 3.3 08/03/2008   PROT 7.4 06/24/2023   ALBUMIN 2.7 (L) 06/24/2023   BILITOT 0.6 06/24/2023   ALKPHOS 32 (L) 06/24/2023   AST 73 (H) 06/24/2023   ALT 85 (H) 06/24/2023   ANIONGAP 9 06/25/2023    CBG (last 3)  Recent Labs    06/24/23 2100 06/25/23 0810 06/25/23 1230  GLUCAP 189* 171* 154*      Coagulation Profile: No results for input(s): "INR", "PROTIME" in the last 168 hours.   Radiology Studies: I have personally reviewed the imaging studies  VAS US CAROTID  Result Date: 06/25/2023 Carotid Arterial Duplex Study Patient Name:  Eric Zamora  Date of Exam:   06/25/2023 Medical Rec #: [id number]         Accession #:    [id number] Date of Birth: 1945-12-06          Patient Gender: M Patient Age:   77 years Exam Location:  [facility] Procedure:      VAS US CAROTID Referring Phys: Brooke Dare --------------------------------------------------------------------------------  Indications:       CVA. Risk Factors:      Hypertension, hyperlipidemia, Diabetes. Limitations        Today's exam was limited due to the patient's respiratory                    variation and patient positioning, patient movement. Comparison Study:  No prior studies. Performing Technologist: Chanda Busing RVT  Examination Guidelines: A complete evaluation includes B-mode imaging, spectral Doppler, color Doppler, and power Doppler as needed of all accessible portions of each vessel. Bilateral testing is considered an integral part of a complete examination. Limited examinations for reoccurring indications may be performed as noted.  Right Carotid Findings: +----------+--------+--------+--------+-----------------------+--------+  C)  TempSrc:  Oral Oral   SpO2: 98% 98% 99% 99%  Weight:      Height:        Intake/Output Summary (Last 24 hours) at 06/25/2023 1258 Last data filed at 06/25/2023 1004 Gross per 24  hour  Intake 1652.61 ml  Output 425 ml  Net 1227.61 ml     Wt Readings from Last 3 Encounters:  06/18/23 80.1 kg  04/15/23 75 kg  11/05/21 75 kg   Physical Exam General: Alert and, pleasant, following commands Cardiovascular: Irregular Respiratory: CTAB, no wheezing Gastrointestinal: Soft, nontender, nondistended, NBS Ext: no pedal edema bilaterally Neuro: bilateral LE  5/5, subtle weakness on the left upper extremity 4/5.  RUE 5/5 Psych: Normal affect     Data Reviewed:  I have personally reviewed following labs    CBC Lab Results  Component Value Date   WBC 10.8 (H) 06/25/2023   RBC 4.76 06/25/2023   HGB 13.8 06/25/2023   HCT 44.5 06/25/2023   MCV 93.5 06/25/2023   MCH 29.0 06/25/2023   PLT 311 06/25/2023   MCHC 31.0 06/25/2023   RDW 14.5 06/25/2023   LYMPHSABS 1.5 06/15/2023   MONOABS 3.2 (H) 06/15/2023   EOSABS 0.0 06/15/2023   BASOSABS 0.1 06/15/2023     Last metabolic panel Lab Results  Component Value Date   NA 151 (H) 06/25/2023   K 3.8 06/25/2023   CL 123 (H) 06/25/2023   CO2 19 (L) 06/25/2023   BUN 36 (H) 06/25/2023   CREATININE 1.71 (H) 06/25/2023   GLUCOSE 104 (H) 06/25/2023   GFRNONAA 40 (L) 06/25/2023   GFRAA 50 (L) 04/12/2020   CALCIUM 8.6 (L) 06/25/2023   PHOS 3.3 08/03/2008   PROT 7.4 06/24/2023   ALBUMIN 2.7 (L) 06/24/2023   BILITOT 0.6 06/24/2023   ALKPHOS 32 (L) 06/24/2023   AST 73 (H) 06/24/2023   ALT 85 (H) 06/24/2023   ANIONGAP 9 06/25/2023    CBG (last 3)  Recent Labs    06/24/23 2100 06/25/23 0810 06/25/23 1230  GLUCAP 189* 171* 154*      Coagulation Profile: No results for input(s): "INR", "PROTIME" in the last 168 hours.   Radiology Studies: I have personally reviewed the imaging studies  VAS US CAROTID  Result Date: 06/25/2023 Carotid Arterial Duplex Study Patient Name:  Eric Zamora  Date of Exam:   06/25/2023 Medical Rec #: [id number]         Accession #:    [id number] Date of Birth: 1945-12-06          Patient Gender: M Patient Age:   77 years Exam Location:  [facility] Procedure:      VAS US CAROTID Referring Phys: Brooke Dare --------------------------------------------------------------------------------  Indications:       CVA. Risk Factors:      Hypertension, hyperlipidemia, Diabetes. Limitations        Today's exam was limited due to the patient's respiratory                    variation and patient positioning, patient movement. Comparison Study:  No prior studies. Performing Technologist: Chanda Busing RVT  Examination Guidelines: A complete evaluation includes B-mode imaging, spectral Doppler, color Doppler, and power Doppler as needed of all accessible portions of each vessel. Bilateral testing is considered an integral part of a complete examination. Limited examinations for reoccurring indications may be performed as noted.  Right Carotid Findings: +----------+--------+--------+--------+-----------------------+--------+  [facility]                                                                              Belle[place]onte, is a 77 y.o. male, DOB - 02-Feb-1946, WUJ:[id number] Admit date - 06/15/2023    Outpatient Primary MD for the patient is Kurth-Bowen, Cornelia, PA-C  LOS - 10  days  Chief Complaint  Patient presents with   Altered Mental Status       Brief summary   Mr. Bohner is a 77 y.o. 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  Significant events: 10/15: Sent from SNF for tachpynea, found to have fever, no localizing source, admitted on antibiotics 10/17: Overnight with fever, aspiration event, new AG acidosis, transferred back to SDU for BiPAP and insulin drip 10/18: CT shows perirectal abscess, general surgery consulted; taken to OR for drainage of abscess 10/19: SLP advanced to Dys 1 diet, honey thick 10/20 to 10/22: Titrating up metoprolol for atrial fibrillation  Assessment & Plan    Principal Problem:   Severe sepsis due to perirectal abscess(HCC) -Presented with fever, leukocytosis, AKI, reported encephalopathy and hypotension.  UTI, pneumonia ruled out. -CT chest abdomen pelvis showed perirectal abscess  -General Surgery was consulted, status post I&D by Dr. Derrell Lolling on 06/18/23 -Continue Augmentin, doxycycline for 14 days - Penrose drain removed, surgery following, cont local wound care, sitz bath   Active problems Atrial fibrillation with RVR, new onset -New onset.  CHA2DS2-Vasc 6 (age, HTN, CHF, DM, vascular disease).  TSH normal.  Echo shows HOCM (previously known).  Normal valves. -Patient was placed on apixaban and Plavix was discontinued -Cardiology following, recommended Lopressor 100 mg 3 times daily.  Outpatient cardioversion in 3 weeks if does not convert to NSR on his own. -TSH  normal  Acute metabolic encephalopathy superimposed on dementia, right MCA embolic appearing stroke -At baseline, patient is ambulatory without assistance, participates in cares, and is interactive, but has severe short term memory loss.  -Keppra was held placed on standard delirium precautions -Given lethargy on 10/24, CT head was done which showed acute/subacute cortical and subcortical right MCA territory infarct centered within the right frontal operculum/right insula,measuring 5.2 x 3.2 cm, petechial hemorrhage.  -Neurology was consulted, recommended holding Eliquis for now due to consideration for needing LP to rule out neurosyphilis -MRI brain showed intermediate-sized acute infarct of the right frontal operculum with moderate associated cytotoxic edema and petechial hemorrhage - normal intracranial MRA -PT OT, SLP following.  LDL 70 at goal.  Hemoglobin A1c 9 4 on 10/15 -2D echo 10/16 showed HOCM (previously known), RV SF normal -HIV negative, follow RPR -Today much more alert oriented and responding to commands  Seizure history -Per neurology, continue to hold Depakote due to transaminitis and Keppra -Routine EEG  Hypertrophic cardiomyopathy, moderate MR -Initially treated with IV Lasix however now sodium has been trending up, diuretics on hold -Continue beta-blocker   Transaminitis -Possibly due to shock liver, from severe sepsis, hypotension.   -  C)  TempSrc:  Oral Oral   SpO2: 98% 98% 99% 99%  Weight:      Height:        Intake/Output Summary (Last 24 hours) at 06/25/2023 1258 Last data filed at 06/25/2023 1004 Gross per 24  hour  Intake 1652.61 ml  Output 425 ml  Net 1227.61 ml     Wt Readings from Last 3 Encounters:  06/18/23 80.1 kg  04/15/23 75 kg  11/05/21 75 kg   Physical Exam General: Alert and, pleasant, following commands Cardiovascular: Irregular Respiratory: CTAB, no wheezing Gastrointestinal: Soft, nontender, nondistended, NBS Ext: no pedal edema bilaterally Neuro: bilateral LE  5/5, subtle weakness on the left upper extremity 4/5.  RUE 5/5 Psych: Normal affect     Data Reviewed:  I have personally reviewed following labs    CBC Lab Results  Component Value Date   WBC 10.8 (H) 06/25/2023   RBC 4.76 06/25/2023   HGB 13.8 06/25/2023   HCT 44.5 06/25/2023   MCV 93.5 06/25/2023   MCH 29.0 06/25/2023   PLT 311 06/25/2023   MCHC 31.0 06/25/2023   RDW 14.5 06/25/2023   LYMPHSABS 1.5 06/15/2023   MONOABS 3.2 (H) 06/15/2023   EOSABS 0.0 06/15/2023   BASOSABS 0.1 06/15/2023     Last metabolic panel Lab Results  Component Value Date   NA 151 (H) 06/25/2023   K 3.8 06/25/2023   CL 123 (H) 06/25/2023   CO2 19 (L) 06/25/2023   BUN 36 (H) 06/25/2023   CREATININE 1.71 (H) 06/25/2023   GLUCOSE 104 (H) 06/25/2023   GFRNONAA 40 (L) 06/25/2023   GFRAA 50 (L) 04/12/2020   CALCIUM 8.6 (L) 06/25/2023   PHOS 3.3 08/03/2008   PROT 7.4 06/24/2023   ALBUMIN 2.7 (L) 06/24/2023   BILITOT 0.6 06/24/2023   ALKPHOS 32 (L) 06/24/2023   AST 73 (H) 06/24/2023   ALT 85 (H) 06/24/2023   ANIONGAP 9 06/25/2023    CBG (last 3)  Recent Labs    06/24/23 2100 06/25/23 0810 06/25/23 1230  GLUCAP 189* 171* 154*      Coagulation Profile: No results for input(s): "INR", "PROTIME" in the last 168 hours.   Radiology Studies: I have personally reviewed the imaging studies  VAS US CAROTID  Result Date: 06/25/2023 Carotid Arterial Duplex Study Patient Name:  Eric Zamora  Date of Exam:   06/25/2023 Medical Rec #: [id number]         Accession #:    [id number] Date of Birth: 1945-12-06          Patient Gender: M Patient Age:   77 years Exam Location:  [facility] Procedure:      VAS US CAROTID Referring Phys: Brooke Dare --------------------------------------------------------------------------------  Indications:       CVA. Risk Factors:      Hypertension, hyperlipidemia, Diabetes. Limitations        Today's exam was limited due to the patient's respiratory                    variation and patient positioning, patient movement. Comparison Study:  No prior studies. Performing Technologist: Chanda Busing RVT  Examination Guidelines: A complete evaluation includes B-mode imaging, spectral Doppler, color Doppler, and power Doppler as needed of all accessible portions of each vessel. Bilateral testing is considered an integral part of a complete examination. Limited examinations for reoccurring indications may be performed as noted.  Right Carotid Findings: +----------+--------+--------+--------+-----------------------+--------+  [facility]                                                                              Belle[place]onte, is a 77 y.o. male, DOB - 02-Feb-1946, WUJ:[id number] Admit date - 06/15/2023    Outpatient Primary MD for the patient is Kurth-Bowen, Cornelia, PA-C  LOS - 10  days  Chief Complaint  Patient presents with   Altered Mental Status       Brief summary   Mr. Bohner is a 77 y.o. 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  Significant events: 10/15: Sent from SNF for tachpynea, found to have fever, no localizing source, admitted on antibiotics 10/17: Overnight with fever, aspiration event, new AG acidosis, transferred back to SDU for BiPAP and insulin drip 10/18: CT shows perirectal abscess, general surgery consulted; taken to OR for drainage of abscess 10/19: SLP advanced to Dys 1 diet, honey thick 10/20 to 10/22: Titrating up metoprolol for atrial fibrillation  Assessment & Plan    Principal Problem:   Severe sepsis due to perirectal abscess(HCC) -Presented with fever, leukocytosis, AKI, reported encephalopathy and hypotension.  UTI, pneumonia ruled out. -CT chest abdomen pelvis showed perirectal abscess  -General Surgery was consulted, status post I&D by Dr. Derrell Lolling on 06/18/23 -Continue Augmentin, doxycycline for 14 days - Penrose drain removed, surgery following, cont local wound care, sitz bath   Active problems Atrial fibrillation with RVR, new onset -New onset.  CHA2DS2-Vasc 6 (age, HTN, CHF, DM, vascular disease).  TSH normal.  Echo shows HOCM (previously known).  Normal valves. -Patient was placed on apixaban and Plavix was discontinued -Cardiology following, recommended Lopressor 100 mg 3 times daily.  Outpatient cardioversion in 3 weeks if does not convert to NSR on his own. -TSH  normal  Acute metabolic encephalopathy superimposed on dementia, right MCA embolic appearing stroke -At baseline, patient is ambulatory without assistance, participates in cares, and is interactive, but has severe short term memory loss.  -Keppra was held placed on standard delirium precautions -Given lethargy on 10/24, CT head was done which showed acute/subacute cortical and subcortical right MCA territory infarct centered within the right frontal operculum/right insula,measuring 5.2 x 3.2 cm, petechial hemorrhage.  -Neurology was consulted, recommended holding Eliquis for now due to consideration for needing LP to rule out neurosyphilis -MRI brain showed intermediate-sized acute infarct of the right frontal operculum with moderate associated cytotoxic edema and petechial hemorrhage - normal intracranial MRA -PT OT, SLP following.  LDL 70 at goal.  Hemoglobin A1c 9 4 on 10/15 -2D echo 10/16 showed HOCM (previously known), RV SF normal -HIV negative, follow RPR -Today much more alert oriented and responding to commands  Seizure history -Per neurology, continue to hold Depakote due to transaminitis and Keppra -Routine EEG  Hypertrophic cardiomyopathy, moderate MR -Initially treated with IV Lasix however now sodium has been trending up, diuretics on hold -Continue beta-blocker   Transaminitis -Possibly due to shock liver, from severe sepsis, hypotension.   -  C)  TempSrc:  Oral Oral   SpO2: 98% 98% 99% 99%  Weight:      Height:        Intake/Output Summary (Last 24 hours) at 06/25/2023 1258 Last data filed at 06/25/2023 1004 Gross per 24  hour  Intake 1652.61 ml  Output 425 ml  Net 1227.61 ml     Wt Readings from Last 3 Encounters:  06/18/23 80.1 kg  04/15/23 75 kg  11/05/21 75 kg   Physical Exam General: Alert and, pleasant, following commands Cardiovascular: Irregular Respiratory: CTAB, no wheezing Gastrointestinal: Soft, nontender, nondistended, NBS Ext: no pedal edema bilaterally Neuro: bilateral LE  5/5, subtle weakness on the left upper extremity 4/5.  RUE 5/5 Psych: Normal affect     Data Reviewed:  I have personally reviewed following labs    CBC Lab Results  Component Value Date   WBC 10.8 (H) 06/25/2023   RBC 4.76 06/25/2023   HGB 13.8 06/25/2023   HCT 44.5 06/25/2023   MCV 93.5 06/25/2023   MCH 29.0 06/25/2023   PLT 311 06/25/2023   MCHC 31.0 06/25/2023   RDW 14.5 06/25/2023   LYMPHSABS 1.5 06/15/2023   MONOABS 3.2 (H) 06/15/2023   EOSABS 0.0 06/15/2023   BASOSABS 0.1 06/15/2023     Last metabolic panel Lab Results  Component Value Date   NA 151 (H) 06/25/2023   K 3.8 06/25/2023   CL 123 (H) 06/25/2023   CO2 19 (L) 06/25/2023   BUN 36 (H) 06/25/2023   CREATININE 1.71 (H) 06/25/2023   GLUCOSE 104 (H) 06/25/2023   GFRNONAA 40 (L) 06/25/2023   GFRAA 50 (L) 04/12/2020   CALCIUM 8.6 (L) 06/25/2023   PHOS 3.3 08/03/2008   PROT 7.4 06/24/2023   ALBUMIN 2.7 (L) 06/24/2023   BILITOT 0.6 06/24/2023   ALKPHOS 32 (L) 06/24/2023   AST 73 (H) 06/24/2023   ALT 85 (H) 06/24/2023   ANIONGAP 9 06/25/2023    CBG (last 3)  Recent Labs    06/24/23 2100 06/25/23 0810 06/25/23 1230  GLUCAP 189* 171* 154*      Coagulation Profile: No results for input(s): "INR", "PROTIME" in the last 168 hours.   Radiology Studies: I have personally reviewed the imaging studies  VAS US CAROTID  Result Date: 06/25/2023 Carotid Arterial Duplex Study Patient Name:  Eric Zamora  Date of Exam:   06/25/2023 Medical Rec #: [id number]         Accession #:    [id number] Date of Birth: 1945-12-06          Patient Gender: M Patient Age:   77 years Exam Location:  [facility] Procedure:      VAS US CAROTID Referring Phys: Brooke Dare --------------------------------------------------------------------------------  Indications:       CVA. Risk Factors:      Hypertension, hyperlipidemia, Diabetes. Limitations        Today's exam was limited due to the patient's respiratory                    variation and patient positioning, patient movement. Comparison Study:  No prior studies. Performing Technologist: Chanda Busing RVT  Examination Guidelines: A complete evaluation includes B-mode imaging, spectral Doppler, color Doppler, and power Doppler as needed of all accessible portions of each vessel. Bilateral testing is considered an integral part of a complete examination. Limited examinations for reoccurring indications may be performed as noted.  Right Carotid Findings: +----------+--------+--------+--------+-----------------------+--------+  [facility]                                                                              Belle[place]onte, is a 77 y.o. male, DOB - 02-Feb-1946, WUJ:[id number] Admit date - 06/15/2023    Outpatient Primary MD for the patient is Kurth-Bowen, Cornelia, PA-C  LOS - 10  days  Chief Complaint  Patient presents with   Altered Mental Status       Brief summary   Mr. Bohner is a 77 y.o. 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  Significant events: 10/15: Sent from SNF for tachpynea, found to have fever, no localizing source, admitted on antibiotics 10/17: Overnight with fever, aspiration event, new AG acidosis, transferred back to SDU for BiPAP and insulin drip 10/18: CT shows perirectal abscess, general surgery consulted; taken to OR for drainage of abscess 10/19: SLP advanced to Dys 1 diet, honey thick 10/20 to 10/22: Titrating up metoprolol for atrial fibrillation  Assessment & Plan    Principal Problem:   Severe sepsis due to perirectal abscess(HCC) -Presented with fever, leukocytosis, AKI, reported encephalopathy and hypotension.  UTI, pneumonia ruled out. -CT chest abdomen pelvis showed perirectal abscess  -General Surgery was consulted, status post I&D by Dr. Derrell Lolling on 06/18/23 -Continue Augmentin, doxycycline for 14 days - Penrose drain removed, surgery following, cont local wound care, sitz bath   Active problems Atrial fibrillation with RVR, new onset -New onset.  CHA2DS2-Vasc 6 (age, HTN, CHF, DM, vascular disease).  TSH normal.  Echo shows HOCM (previously known).  Normal valves. -Patient was placed on apixaban and Plavix was discontinued -Cardiology following, recommended Lopressor 100 mg 3 times daily.  Outpatient cardioversion in 3 weeks if does not convert to NSR on his own. -TSH  normal  Acute metabolic encephalopathy superimposed on dementia, right MCA embolic appearing stroke -At baseline, patient is ambulatory without assistance, participates in cares, and is interactive, but has severe short term memory loss.  -Keppra was held placed on standard delirium precautions -Given lethargy on 10/24, CT head was done which showed acute/subacute cortical and subcortical right MCA territory infarct centered within the right frontal operculum/right insula,measuring 5.2 x 3.2 cm, petechial hemorrhage.  -Neurology was consulted, recommended holding Eliquis for now due to consideration for needing LP to rule out neurosyphilis -MRI brain showed intermediate-sized acute infarct of the right frontal operculum with moderate associated cytotoxic edema and petechial hemorrhage - normal intracranial MRA -PT OT, SLP following.  LDL 70 at goal.  Hemoglobin A1c 9 4 on 10/15 -2D echo 10/16 showed HOCM (previously known), RV SF normal -HIV negative, follow RPR -Today much more alert oriented and responding to commands  Seizure history -Per neurology, continue to hold Depakote due to transaminitis and Keppra -Routine EEG  Hypertrophic cardiomyopathy, moderate MR -Initially treated with IV Lasix however now sodium has been trending up, diuretics on hold -Continue beta-blocker   Transaminitis -Possibly due to shock liver, from severe sepsis, hypotension.   -

## 2023-06-25 NOTE — Consult Note (Signed)
Consultation Note Date: 06/25/2023   Patient Name: Eric Zamora  DOB: 02-Dec-1945  MRN: [id number]  Age / Sex: 77 y.o., male   PCP: Uvaldo Bristle, PA-C Referring Physician: Cathren Harsh, MD  Reason for Consultation: Establishing goals of care     Chief Complaint/History of Present Illness:   Patient is a 77 year old male with a past medical history of bipolar disorder, dementia, CKD, CAD, HFpEF, hypertension, history of DVT, and long-term care resident at SNF who was admitted on 06/15/2023 for management of fever, tachypnea, and found to have a perirectal abscess.  During hospitalization patient has received management for severe sepsis due to perirectal abscess.  General surgery was consulted and patient is status post I&D on 06/18/2023; continues to receive antibiotics for management.  Patient also receiving management for new onset atrial fibrillation with RVR.  Patient also found to have a acute right MCA embolic appearing stroke found on imaging on 06/24/2023.  Neurology consulted for recommendations.  Nephrology also consulted due to AKI on CKD.  Palliative medicine team consulted to assist with complex medical decision making.  Extensive review of EMR prior to presenting to bedside.  As per EMR review, patient has noted legal guardian, [name].  Presented to bedside to meet with patient.  Patient seen sitting up in bed watching TV.  Introduced myself and my role as a member of the palliative medicine team.  Patient is able to state that he is in the hospital.  Inquired if patient was having any symptom concerns and he denied that he has.  Patient was able to state he ate all of his breakfast this morning so he is not hungry.  Tried to explore why patient was in the hospital and he could note he was sick though had difficulties stating it was due to an abscess and stroke.  Did take time to explain this to them which he agreed with.  Inquired what patient was hoping for at  this time and he noted he wanted to regain strength.  Inquired if physical therapy had come by yet and he noted not today.  Inquired if patient was willing to work with physical therapy and he agreed that he was.  Answered all questions as able at that time noted palliative medicine team will continue to follow with patient's medical journey.  Discussed care with RN for updates.  RN confirmed patient ate all of his breakfast this morning and has been answering questions appropriately for her even if his responses are slow.  Noted would reach out to hospitalist about reconsulting physical therapy for evaluation.  Primary Diagnoses  Present on Admission:  Severe sepsis due to perirectal abscess(HCC)  Acute renal failure superimposed on stage 3b chronic kidney disease (HCC)  Essential hypertension  CAD- Moderate LAD disease with moderate to severe ostial first diagonal disease by cath 07/2013 - not ammendable to PCI. 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   Palliative Review of Systems: Denies any symptoms of concern   Past Medical History:  Diagnosis Date   Alzheimer disease (HCC)    Bipolar affective disorder (HCC)    Chronic renal insufficiency    Dementia (HCC)    Dyslipidemia    HTN (hypertension)    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   gradient on TEE  Syncopal episodes    Social History   Socioeconomic History   Marital status: Divorced    Spouse name: Not on file   Number of children: 5   Years of education: some college   Highest education level: Not on file  Occupational History   Occupation: Retired  Tobacco Use   Smoking status: Never   Smokeless tobacco: Never  Vaping Use   Vaping status: Never Used   Substance and Sexual Activity   Alcohol use: No   Drug use: Not Currently   Sexual activity: Not on file  Other Topics Concern   Not on file  Social History Narrative   Lives at [place].   Right-handed.   No daily caffeine use.       Social Determinants of Health   Financial Resource Strain: Not on file  Food Insecurity: Patient Unable To Answer (06/15/2023)   Hunger Vital Sign    Worried About Running Out of Food in the Last Year: Patient unable to answer    Ran Out of Food in the Last Year: Patient unable to answer  Transportation Needs: Patient Unable To Answer (06/15/2023)   PRAPARE - Administrator, Civil Service (Medical): Patient unable to answer    Lack of Transportation (Non-Medical): Patient unable to answer  Physical Activity: Not on file  Stress: Not on file  Social Connections: Not on file   History reviewed. No pertinent family history. Scheduled Meds:   stroke: early stages of recovery book   Does not apply Once   amoxicillin-clavulanate  1 tablet Oral Q12H   doxycycline  100 mg Oral Q12H   feeding supplement  237 mL Oral BID BM   insulin aspart  0-15 Units Subcutaneous TID WC   insulin aspart  0-5 Units Subcutaneous QHS   insulin aspart  3 Units Subcutaneous TID WC   insulin glargine-yfgn  22 Units Subcutaneous Q24H   metoprolol tartrate  100 mg Oral TID   nystatin  5 mL Oral QID   mouth rinse  15 mL Mouth Rinse 4 times per day   sodium chloride flush  3 mL Intravenous Q12H   Continuous Infusions: PRN Meds:.acetaminophen, albuterol, ondansetron **OR** ondansetron (ZOFRAN) IV, mouth rinse, mouth rinse, oxyCODONE, traZODone No Known Allergies CBC:    Component Value Date/Time   WBC 10.8 (H) 06/25/2023 0559   HGB 13.8 06/25/2023 0559   HCT 44.5 06/25/2023 0559   PLT 311 06/25/2023 0559   MCV 93.5 06/25/2023 0559   NEUTROABS 12.1 (H) 06/15/2023 1305   LYMPHSABS 1.5 06/15/2023 1305   MONOABS 3.2 (H) 06/15/2023 1305   EOSABS 0.0  06/15/2023 1305   BASOSABS 0.1 06/15/2023 1305   Comprehensive Metabolic Panel:    Component Value Date/Time   NA 151 (H) 06/25/2023 0559   K 3.8 06/25/2023 0559   CL 123 (H) 06/25/2023 0559   CO2 19 (L) 06/25/2023 0559   BUN 36 (H) 06/25/2023 0559   CREATININE 1.71 (H) 06/25/2023 0559   GLUCOSE 104 (H) 06/25/2023 0559   CALCIUM 8.6 (L) 06/25/2023 0559   AST 73 (H) 06/24/2023 1245   ALT 85 (H) 06/24/2023 1245   ALKPHOS 32 (L) 06/24/2023 1245   BILITOT 0.6 06/24/2023 1245   PROT 7.4 06/24/2023 1245   ALBUMIN 2.7 (L) 06/24/2023 1245    Physical Exam: Vital Signs: BP 132/73 (BP Location: Left Arm)   Pulse 92   Temp 97.8 F (36.6 C) (Oral)   Resp 18   Ht 5'  5" (1.651 m)   Wt 80.1 kg   SpO2 99%   BMI 29.39 kg/m  SpO2: SpO2: 99 % O2 Device: O2 Device: Room Air O2 Flow Rate: O2 Flow Rate (L/min): 0 L/min Intake/output summary:  Intake/Output Summary (Last 24 hours) at 06/25/2023 0907 Last data filed at 06/25/2023 1610 Gross per 24 hour  Intake 1534.61 ml  Output 725 ml  Net 809.61 ml   LBM: Last BM Date : 06/23/23 Baseline Weight: Weight: 80.1 kg Most recent weight: Weight: 80.1 kg  General: NAD, sitting up in bed, awake, interactive HENT: moist mucous membranes Cardiovascular: RRR Respiratory: no increased work of breathing noted, not in respiratory distress Neuro: awake, interactive, orientated to place, appropraite answering simple questions with slowed speech  Psych: calm          Palliative Performance Scale: 40%              Additional Data Reviewed: Recent Labs    06/23/23 0544 06/24/23 1245 06/25/23 0559  WBC 15.6*  --  10.8*  HGB 14.0  --  13.8  PLT 364  --  311  NA 151* 152* 151*  BUN 41* 42* 36*  CREATININE 1.99* 2.06* 1.71*    Imaging: MR ANGIO HEAD WO CONTRAST CLINICAL DATA:  Stroke/TIA  EXAM: MRI HEAD WITHOUT CONTRAST  MRA HEAD WITHOUT CONTRAST  TECHNIQUE: Multiplanar, multi-echo pulse sequences of the brain and  surrounding structures were acquired without intravenous contrast. Angiographic images of the Circle of Willis were acquired using MRA technique without intravenous contrast.  COMPARISON:  None Available.  FINDINGS: MRI HEAD FINDINGS  Brain: Intermediate sized region abnormal diffusion restriction in the right frontal operculum. Moderate associated cytotoxic edema and petechial hemorrhage. Mild generalized volume loss. The midline structures are normal. 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  Vascular: Normal flow voids  Skull and upper cervical spine: Normal calvarium and skull base. Visualized upper cervical spine and soft tissues are normal.  Sinuses/Orbits:No paranasal sinus fluid levels or advanced mucosal thickening. No mastoid or middle ear effusion. Normal orbits.  MRA HEAD FINDINGS  POSTERIOR CIRCULATION:  --Vertebral arteries: Normal  --Inferior cerebellar arteries: Normal.  --Basilar artery: Normal.  --Superior cerebellar arteries: Normal.  --Posterior cerebral arteries: Normal.  ANTERIOR CIRCULATION:  --Intracranial internal carotid arteries: Normal.  --Anterior cerebral arteries (ACA): Normal.  --Middle cerebral arteries (MCA): Normal.  Anatomic variants: Fetal origin of the right PCA  IMPRESSION: 1. Intermediate sized acute infarct of the right frontal operculum with moderate associated cytotoxic edema and petechial hemorrhage. 2. Normal intracranial MRA.  Electronically Signed   By: Deatra Robinson M.D.   On: 06/25/2023 01:31 MR BRAIN WO CONTRAST CLINICAL DATA:  Stroke/TIA  EXAM: MRI HEAD WITHOUT CONTRAST  MRA HEAD WITHOUT CONTRAST  TECHNIQUE: Multiplanar, multi-echo pulse sequences of the brain and surrounding structures were acquired without intravenous contrast. Angiographic images of the Circle of Willis were acquired using MRA technique without intravenous contrast.  COMPARISON:  None Available.  FINDINGS: MRI HEAD  FINDINGS  Brain: Intermediate sized region abnormal diffusion restriction in the right frontal operculum. Moderate associated cytotoxic edema and petechial hemorrhage. Mild generalized volume loss. The midline structures are normal. 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  Vascular: Normal flow voids  Skull and upper cervical spine: Normal calvarium and skull base. Visualized upper cervical spine and soft tissues are normal.  Sinuses/Orbits:No paranasal sinus fluid levels or advanced mucosal thickening. No mastoid or middle ear effusion. Normal orbits.  MRA HEAD FINDINGS  POSTERIOR CIRCULATION:  --Vertebral  arteries: Normal  --Inferior cerebellar arteries: Normal.  --Basilar artery: Normal.  --Superior cerebellar arteries: Normal.  --Posterior cerebral arteries: Normal.  ANTERIOR CIRCULATION:  --Intracranial internal carotid arteries: Normal.  --Anterior cerebral arteries (ACA): Normal.  --Middle cerebral arteries (MCA): Normal.  Anatomic variants: Fetal origin of the right PCA  IMPRESSION: 1. Intermediate sized acute infarct of the right frontal operculum with moderate associated cytotoxic edema and petechial hemorrhage. 2. Normal intracranial MRA.  Electronically Signed   By: Deatra Robinson M.D.   On: 06/25/2023 01:31    I personally reviewed recent imaging.   Palliative Care Assessment and Plan Summary of Established Goals of Care and Medical Treatment Preferences   Patient is a 77 year old male with a past medical history of bipolar disorder, dementia, CKD, CAD, HFpEF, hypertension, history of DVT, and long-term care resident at SNF who was admitted on 06/15/2023 for management of fever, tachypnea, and found to have a perirectal abscess.  During hospitalization patient has received management for severe sepsis due to perirectal abscess.  General surgery was consulted and patient is status post I&D on 06/18/2023; continues to receive antibiotics for  management.  Patient also receiving management for new onset atrial fibrillation with RVR.  Patient also found to have a acute right MCA embolic appearing stroke found on imaging on 06/24/2023.  Neurology consulted for recommendations.  Nephrology also consulted due to AKI on CKD.  Palliative medicine team consulted to assist with complex medical decision making.  # Complex medical decision making/goals of care  -As per EMR review, patient does have a legal guardian- Caryn Bee Mbunia. When seeing patient this morning, he was able to eat all of his breakfast, answered simple questions appropriately though speech was slowed overall, and noted he wanted to work with PT for regain strength. Have asked [facility]ptialsit to reengage PT to evaluate potential for rehab. Patient was a LTC resident before admission as per EMR review.   -  Code Status: Full Code   # Symptom management  -Patient denied symptoms of concern when seen.   # Psycho-social/Spiritual Support:  - Support System: noted to have legal guardian as per EMR review   # Discharge Planning:  To Be Determined  Thank you for allowing the palliative care team to participate in the care [facility].  Alvester Morin, DO Palliative Care Provider PMT # [phone]  If patient remains symptomatic despite maximum doses, please call PMT at [phone] between 0700 and 1900. Outside of these hours, please call attending, as PMT does not have night coverage.  Spent 60 minutes in patient care including extensive chart review (labs, imaging, progress/consult notes, vital signs), medically appropraite exam, discussed with treatment team, education to patient, family, and staff, documenting clinical information, medication review and management, coordination of care, and available advanced directive documents.   *Please note that this is a verbal dictation therefore any spelling or grammatical errors are due to the "[employer]agon [employer] One" system interpretation.

## 2023-06-25 NOTE — TOC Progression Note (Signed)
Transition of Care [facility]) - Progression Note    Patient Details  Name: Eric Zamora MRN: [id number] Date of Birth: 1945/12/02  Transition of Care [facility] CM/SW Contact  Otelia Santee, LCSW Phone Number: 06/25/2023, 2:18 PM  Clinical Narrative:    TOC following for medical readiness for SNF placement.    Expected Discharge Plan: Skilled Nursing Facility Barriers to Discharge: SNF Pending bed offer, Inadequate or no insurance  Expected Discharge Plan and Services     Post Acute Care Choice: Skilled Nursing Facility Living arrangements for the past 2 months: Assisted Living Facility [facility])                                       Social Determinants of Health (SDOH) Interventions SDOH Screenings   Food Insecurity: Patient Unable To Answer (06/15/2023)  Housing: Low Risk  (06/15/2023)  Transportation Needs: Patient Unable To Answer (06/15/2023)  Utilities: Patient Unable To Answer (06/15/2023)  Tobacco Use: Low Risk  (06/18/2023)    Readmission Risk Interventions    06/25/2023    2:17 PM  Readmission Risk Prevention Plan  Transportation Screening Complete  PCP or Specialist Appt within 5-7 Days Complete  Home Care Screening Complete  Medication Review (RN CM) Complete

## 2023-06-25 NOTE — Progress Notes (Signed)
Carotid artery duplex has been completed. Preliminary results can be found in CV Proc through chart review.   06/25/23 12:07 PM Olen Cordial RVT

## 2023-06-25 NOTE — Procedures (Addendum)
Patient Name: Eric Zamora  MRN: [id number]  Epilepsy Attending: Charlsie Quest  Referring Physician/Provider: Gordy Councilman, MD  Date: 06/25/2023 Duration: 24.42 mins  Patient history:  77 year old gentleman presenting with sepsis in the setting of perirectal abscess s/p incision and drainage with hospital course complicated by aspiration events, new onset A-fib with RVR, new diagnosis of diabetes, unclear history of seizures. EEG to evaluate for seizure.  Level of alertness: Awake  AEDs during EEG study: None  Technical aspects: This EEG study was done with scalp electrodes positioned according to the 10-20 International system of electrode placement. Electrical activity was reviewed with band pass filter of 1-70Hz , sensitivity of 7 uV/mm, display speed of 68mm/sec with a 60Hz  notched filter applied as appropriate. EEG data were recorded continuously and digitally stored.  Video monitoring was available and reviewed as appropriate.  Description: The posterior dominant rhythm consists of 8 Hz activity of moderate voltage (25-35 uV) seen predominantly in posterior head regions, symmetric and reactive to eye opening and eye closing. Physiologic photic driving was not seen during photic stimulation.  Hyperventilation was not performed.     IMPRESSION: This study is within normal limits. No seizures or epileptiform discharges were seen throughout the recording.  A normal interictal EEG does not exclude the diagnosis of epilepsy.  [name] Annabelle Harman

## 2023-06-25 NOTE — Progress Notes (Signed)
[facility] Progress Note    Assessment/ Plan:   Acute kidney injury on CKD stage IIIb likely hemodynamically mediated due recent use of diuretics/ intravascular volume depletion and sepsis.  Recent CT scan ruled out hydronephrosis. -Baseline Cr around 1.5. Cr improved-down to 1.7 today -c/w IVF--will restart hypotonic saline: d5 1/2NS 75cc/hr x 1 day and then reassess -Avoid nephrotoxic medications including NSAIDs and iodinated intravenous contrast exposure unless the latter is absolutely indicated.  Preferred narcotic agents for pain control are hydromorphone, fentanyl, and methadone. Morphine should not be used. Avoid Baclofen and avoid oral sodium phosphate and magnesium citrate based laxatives / bowel preps. Continue strict Input and Output monitoring. Will monitor the patient closely with you and intervene or adjust therapy as indicated by changes in clinical status/labs    Hypernatremia, hypovolemic: Hypernatremia worsening because of Lasix use.  He is off of Lasix.   -Na down to 151. Hypotonic fluids as above   Acute stroke/acute metabolic encephalopathy -per primary   Severe sepsis due to perirectal abscess status post I&D  -Currently on Augmentin and doxycycline.surgery following   A-fib with RVR: Seen by cardiologist.  On metoprolol and eliquis.  Monitor BP and heart rate.  HTN: BP currently acceptable  Uncontrolled DM2 -per primary service  Subjective:   Patient seen and examined bedside. UOP charted: ~0.7L. Discussed with RN. Patient did eat breakfast When prompted he denies any pain, SOB, swelling.   Objective:   BP 106/69 (BP Location: Left Arm)   Pulse (!) 106   Temp 98.1 F (36.7 C)   Resp 15   Ht 5\' 5"  (1.651 m)   Wt 80.1 kg   SpO2 99%   BMI 29.39 kg/m   Intake/Output Summary (Last 24 hours) at 06/25/2023 1326 Last data filed at 06/25/2023 1004 Gross per 24 hour  Intake 1652.61 ml  Output 425 ml  Net 1227.61 ml   Weight change:    Physical Exam: Gen: ill appearing, laying flat in bed, NAD CVS: RRR Resp: CTA B/L Abd: soft Ext: no edema Neuro: awake, following commands  Imaging: VAS US CAROTID  Result Date: 06/25/2023 Carotid Arterial Duplex Study Patient Name:  NIKKOLAI HARTL  Date of Exam:   06/25/2023 Medical Rec #: [id number]         Accession #:    [id number] Date of Birth: 1945-10-07         Patient Gender: M Patient Age:   77 years Exam Location:  [facility] Procedure:      VAS US CAROTID Referring Phys: Brooke Dare --------------------------------------------------------------------------------  Indications:       CVA. Risk Factors:      Hypertension, hyperlipidemia, Diabetes. Limitations        Today's exam was limited due to the patient's respiratory                    variation and patient positioning, patient movement. Comparison Study:  No prior studies. Performing Technologist: Chanda Busing RVT  Examination Guidelines: A complete evaluation includes B-mode imaging, spectral Doppler, color Doppler, and power Doppler as needed of all accessible portions of each vessel. Bilateral testing is considered an integral part of a complete examination. Limited examinations for reoccurring indications may be performed as noted.  Right Carotid Findings: +----------+--------+--------+--------+-----------------------+--------+           PSV cm/sEDV cm/sStenosisPlaque Description     Comments +----------+--------+--------+--------+-----------------------+--------+ CCA Prox  90      15  smooth and heterogenous         +----------+--------+--------+--------+-----------------------+--------+ CCA Distal65      12              smooth and heterogenous         +----------+--------+--------+--------+-----------------------+--------+ ICA Prox  65      22              smooth and heterogenous         +----------+--------+--------+--------+-----------------------+--------+ ICA Mid   93       29                                     tortuous +----------+--------+--------+--------+-----------------------+--------+ ICA Distal89      26                                     tortuous +----------+--------+--------+--------+-----------------------+--------+ ECA       75      7                                               +----------+--------+--------+--------+-----------------------+--------+ +----------+--------+-------+--------+-------------------+           PSV cm/sEDV cmsDescribeArm Pressure (mmHG) +----------+--------+-------+--------+-------------------+ [id number]                                         +----------+--------+-------+--------+-------------------+ +---------+--------+--+--------+-+---------+ VertebralPSV cm/s41EDV cm/s3Antegrade +---------+--------+--+--------+-+---------+  Left Carotid Findings: +----------+--------+--------+--------+-----------------------+--------+           PSV cm/sEDV cm/sStenosisPlaque Description     Comments +----------+--------+--------+--------+-----------------------+--------+ CCA Prox  83      18              smooth and heterogenous         +----------+--------+--------+--------+-----------------------+--------+ CCA Distal80      17              smooth and heterogenous         +----------+--------+--------+--------+-----------------------+--------+ ICA Prox  55      18              smooth and heterogenous         +----------+--------+--------+--------+-----------------------+--------+ ICA Mid   78      24                                     tortuous +----------+--------+--------+--------+-----------------------+--------+ ICA Distal51      20                                     tortuous +----------+--------+--------+--------+-----------------------+--------+ ECA       58      7                                                +----------+--------+--------+--------+-----------------------+--------+ +----------+--------+--------+--------+-------------------+           PSV cm/sEDV cm/sDescribeArm  Pressure (mmHG) +----------+--------+--------+--------+-------------------+ Subclavian111                                         +----------+--------+--------+--------+-------------------+ +---------+--------+--+--------+-+---------+ VertebralPSV cm/s31EDV cm/s6Antegrade +---------+--------+--+--------+-+---------+   Summary: Right Carotid: Velocities in the right ICA are consistent with a 1-39% stenosis. Left Carotid: Velocities in the left ICA are consistent with a 1-39% stenosis. Vertebrals: Bilateral vertebral arteries demonstrate antegrade flow. *See table(s) above for measurements and observations.     Preliminary    MR BRAIN WO CONTRAST  Result Date: 06/25/2023 CLINICAL DATA:  Stroke/TIA EXAM: MRI HEAD WITHOUT CONTRAST MRA HEAD WITHOUT CONTRAST TECHNIQUE: Multiplanar, multi-echo pulse sequences of the brain and surrounding structures were acquired without intravenous contrast. Angiographic images of the Circle of Willis were acquired using MRA technique without intravenous contrast. COMPARISON:  None Available. FINDINGS: MRI HEAD FINDINGS Brain: Intermediate sized region abnormal diffusion restriction in the right frontal operculum. Moderate associated cytotoxic edema and petechial hemorrhage. Mild generalized volume loss. The midline structures are normal. 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 Vascular: Normal flow voids Skull and upper cervical spine: Normal calvarium and skull base. Visualized upper cervical spine and soft tissues are normal. Sinuses/Orbits:No paranasal sinus fluid levels or advanced mucosal thickening. No mastoid or middle ear effusion. Normal orbits. MRA HEAD FINDINGS POSTERIOR CIRCULATION: --Vertebral arteries: Normal --Inferior cerebellar arteries: Normal. --Basilar artery: Normal.  --Superior cerebellar arteries: Normal. --Posterior cerebral arteries: Normal. ANTERIOR CIRCULATION: --Intracranial internal carotid arteries: Normal. --Anterior cerebral arteries (ACA): Normal. --Middle cerebral arteries (MCA): Normal. Anatomic variants: Fetal origin of the right PCA IMPRESSION: 1. Intermediate sized acute infarct of the right frontal operculum with moderate associated cytotoxic edema and petechial hemorrhage. 2. Normal intracranial MRA. Electronically Signed   By: Deatra Robinson M.D.   On: 06/25/2023 01:31   MR ANGIO HEAD WO CONTRAST  Result Date: 06/25/2023 CLINICAL DATA:  Stroke/TIA EXAM: MRI HEAD WITHOUT CONTRAST MRA HEAD WITHOUT CONTRAST TECHNIQUE: Multiplanar, multi-echo pulse sequences of the brain and surrounding structures were acquired without intravenous contrast. Angiographic images of the Circle of Willis were acquired using MRA technique without intravenous contrast. COMPARISON:  None Available. FINDINGS: MRI HEAD FINDINGS Brain: Intermediate sized region abnormal diffusion restriction in the right frontal operculum. Moderate associated cytotoxic edema and petechial hemorrhage. Mild generalized volume loss. The midline structures are normal. 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 Vascular: Normal flow voids Skull and upper cervical spine: Normal calvarium and skull base. Visualized upper cervical spine and soft tissues are normal. Sinuses/Orbits:No paranasal sinus fluid levels or advanced mucosal thickening. No mastoid or middle ear effusion. Normal orbits. MRA HEAD FINDINGS POSTERIOR CIRCULATION: --Vertebral arteries: Normal --Inferior cerebellar arteries: Normal. --Basilar artery: Normal. --Superior cerebellar arteries: Normal. --Posterior cerebral arteries: Normal. ANTERIOR CIRCULATION: --Intracranial internal carotid arteries: Normal. --Anterior cerebral arteries (ACA): Normal. --Middle cerebral arteries (MCA): Normal. Anatomic variants: Fetal origin of the right PCA  IMPRESSION: 1. Intermediate sized acute infarct of the right frontal operculum with moderate associated cytotoxic edema and petechial hemorrhage. 2. Normal intracranial MRA. Electronically Signed   By: Deatra Robinson M.D.   On: 06/25/2023 01:31   CT HEAD WO CONTRAST ( )  Addendum Date: 06/24/2023   ADDENDUM REPORT: 06/24/2023 20:44 ADDENDUM: Impression #1 called by telephone at the time of interpretation on 06/24/2023 at 2:57 pm to provider RIPUDEEP RAI , who verbally acknowledged these results. Electronically Signed   By: Jackey Loge D.O.   On: 06/24/2023  20:44   Result Date: 06/24/2023 CLINICAL DATA:  Provided history: Mental status change, unknown cause. EXAM: CT HEAD WITHOUT CONTRAST TECHNIQUE: Contiguous axial images were obtained from the base of the skull through the vertex without intravenous contrast. RADIATION DOSE REDUCTION: This exam was performed according to the departmental dose-optimization program which includes automated exposure control, adjustment of the mA and/or kV according to patient size and/or use of iterative reconstruction technique. COMPARISON:  Prior head CT examinations 06/15/2023 and earlier. FINDINGS: Brain: Generalized cerebral atrophy. Commensurate prominence of the ventricles and sulci. Acute/subacute cortical and subcortical right MCA territory infarct centered within the right frontal operculum/right insula, measuring 5.2 x 3.2 cm in transaxial dimensions. Curvilinear and ill-defined hyperdensity within portions of the infarction territory, likely reflecting petechial hemorrhage. Local mass effect without midline shift or significant ventricular effacement. Chronic lacunar infarct again noted within the right caudate nucleus. No extra-axial fluid collection. No evidence of an intracranial mass. Vascular: No definite hyperdense vessel. Atherosclerotic calcifications. Skull: No calvarial fracture or aggressive osseous lesion. Sinuses/Orbits: No mass or acute finding  within the imaged orbits. Opacification of a single left ethmoid air cell at the imaged levels. Attempts are being made through the ordering provider at this time. IMPRESSION: 1. Acute/subacute cortical and subcortical right MCA territory infarct centered within the right frontal operculum/right insula, measuring 5.2 x 3.2 cm in transaxial dimensions. Curvilinear and ill-defined hyperdensity within portions of the infarction territory likely reflecting petechial hemorrhage. Local mass effect without midline shift or significant ventricular effacement. 2. Chronic lacunar infarct within the right caudate nucleus. 3. Cerebral atrophy. Electronically Signed: By: Jackey Loge D.O. On: 06/24/2023 14:55    Labs: BMET Recent Labs  Lab 06/19/23 0415 06/20/23 0302 06/21/23 0544 06/22/23 0613 06/23/23 0544 06/24/23 1245 06/25/23 0559  NA 142 145 144 149* 151* 152* 151*  K 3.9 3.7 3.9 3.5 3.8 3.8 3.8  CL 116* 119* 116* 118* 123* 123* 123*  CO2 18* 20* 17* 22 20* 20* 19*  GLUCOSE 111* 91 115* 146* 162* 217* 104*  BUN 30* 26* 22 35* 41* 42* 36*  CREATININE 1.52* 1.44* 1.50* 1.84* 1.99* 2.06* 1.71*  CALCIUM 7.6* 7.8* 7.8* 8.3* 8.7* 8.6* 8.6*   CBC Recent Labs  Lab 06/21/23 0544 06/22/23 0613 06/23/23 0544 06/25/23 0559  WBC 10.9* 15.6* 15.6* 10.8*  HGB 12.5* 13.1 14.0 13.8  HCT 37.9* 40.4 42.9 44.5  MCV 87.1 87.4 89.7 93.5  PLT 273 369 364 311    Medications:     amoxicillin-clavulanate  1 tablet Oral Q12H   doxycycline  100 mg Oral Q12H   feeding supplement  237 mL Oral BID BM   insulin aspart  0-15 Units Subcutaneous TID WC   insulin aspart  0-5 Units Subcutaneous QHS   insulin aspart  3 Units Subcutaneous TID WC   insulin glargine-yfgn  22 Units Subcutaneous Q24H   metoprolol tartrate  100 mg Oral TID   nystatin  5 mL Oral QID   mouth rinse  15 mL Mouth Rinse 4 times per day   sodium chloride flush  3 mL Intravenous Q12H      Anthony Sar, MD [facility] 06/25/2023, 1:26 PM

## 2023-06-26 DIAGNOSIS — Z515 Encounter for palliative care: Secondary | ICD-10-CM | POA: Diagnosis not present

## 2023-06-26 DIAGNOSIS — R4182 Altered mental status, unspecified: Secondary | ICD-10-CM | POA: Diagnosis not present

## 2023-06-26 DIAGNOSIS — A419 Sepsis, unspecified organism: Secondary | ICD-10-CM | POA: Diagnosis not present

## 2023-06-26 DIAGNOSIS — N179 Acute kidney failure, unspecified: Secondary | ICD-10-CM | POA: Diagnosis not present

## 2023-06-26 DIAGNOSIS — E1110 Type 2 diabetes mellitus with ketoacidosis without coma: Secondary | ICD-10-CM | POA: Diagnosis not present

## 2023-06-26 DIAGNOSIS — I4891 Unspecified atrial fibrillation: Secondary | ICD-10-CM | POA: Diagnosis not present

## 2023-06-26 DIAGNOSIS — F319 Bipolar disorder, unspecified: Secondary | ICD-10-CM | POA: Diagnosis not present

## 2023-06-26 LAB — CBC
HCT: 49.4 % (ref 39.0–52.0)
Hemoglobin: 15 g/dL (ref 13.0–17.0)
MCH: 28.5 pg (ref 26.0–34.0)
MCHC: 30.4 g/dL (ref 30.0–36.0)
MCV: 93.9 fL (ref 80.0–100.0)
Platelets: 298 10*3/uL (ref 150–400)
RBC: 5.26 MIL/uL (ref 4.22–5.81)
RDW: 14.2 % (ref 11.5–15.5)
WBC: 9.2 10*3/uL (ref 4.0–10.5)
nRBC: 0 % (ref 0.0–0.2)

## 2023-06-26 LAB — GLUCOSE, CAPILLARY
Glucose-Capillary: 109 mg/dL — ABNORMAL HIGH (ref 70–99)
Glucose-Capillary: 168 mg/dL — ABNORMAL HIGH (ref 70–99)
Glucose-Capillary: 195 mg/dL — ABNORMAL HIGH (ref 70–99)
Glucose-Capillary: 102 mg/dL — ABNORMAL HIGH (ref 70–99)

## 2023-06-26 LAB — BASIC METABOLIC PANEL
Anion gap: 8 (ref 5–15)
BUN: 33 mg/dL — ABNORMAL HIGH (ref 8–23)
CO2: 20 mmol/L — ABNORMAL LOW (ref 22–32)
Calcium: 8.6 mg/dL — ABNORMAL LOW (ref 8.9–10.3)
Chloride: 123 mmol/L — ABNORMAL HIGH (ref 98–111)
Creatinine, Ser: 1.58 mg/dL — ABNORMAL HIGH (ref 0.61–1.24)
GFR, Estimated: 44 mL/min — ABNORMAL LOW (ref >60)
Glucose, Bld: 159 mg/dL — ABNORMAL HIGH (ref 70–99)
Potassium: 3.9 mmol/L (ref 3.5–5.1)
Sodium: 151 mmol/L — ABNORMAL HIGH (ref 135–145)

## 2023-06-26 LAB — T.PALLIDUM AB, TOTAL

## 2023-06-26 MED ORDER — DEXTROSE-SODIUM CHLORIDE 5-0.45 % IV SOLN: INTRAVENOUS | Status: DC

## 2023-06-26 MED ORDER — PENICILLIN G BENZATHINE 1200000 UNIT/2ML IM SUSY
2.4000 10*6.[IU] | PREFILLED_SYRINGE | Freq: Once | INTRAMUSCULAR | Status: AC
  Administered 2023-06-28: 2.4 10*6.[IU] via INTRAMUSCULAR
  Filled 2023-06-26 (×2): qty 4

## 2023-06-26 NOTE — Progress Notes (Addendum)
[facility]                                                                              Sal[place], is a 77 y.o. male, DOB - February 18, 1946, GNF:[id number] Admit date - 06/15/2023    Outpatient Primary MD for the patient is Kurth-Bowen, Cornelia, PA-C  LOS - 11  days  Chief Complaint  Patient presents with   Altered Mental Status       Brief summary   Eric Zamora is a 77 y.o. 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  Significant events: 10/15: Sent from SNF for tachpynea, found to have fever, no localizing source, admitted on antibiotics 10/17: Overnight with fever, aspiration event, new AG acidosis, transferred back to SDU for BiPAP and insulin drip 10/18: CT shows perirectal abscess, general surgery consulted; taken to OR for drainage of abscess 10/19: SLP advanced to Dys 1 diet, honey thick 10/20 to 10/22: Titrating up metoprolol for atrial fibrillation  Assessment & Plan    Principal Problem:   Severe sepsis due to perirectal abscess(HCC) -Presented with fever, leukocytosis, AKI, reported encephalopathy and hypotension.  UTI, pneumonia ruled out. -CT chest abdomen pelvis showed perirectal abscess  -General Surgery was consulted, status post I&D by Dr. Derrell Lolling on 06/18/23 -Continue Augmentin, doxycycline for 14 days - Penrose drain removed, surgery recommended to continue local wound care, sitz bath   Active problems Atrial fibrillation with RVR, new onset -New onset.  CHA2DS2-Vasc 6 (age, HTN, CHF, DM, vascular disease).  TSH normal.  Echo shows HOCM (previously known).  Normal valves. -Patient was placed on apixaban and Plavix was discontinued -Cardiology following, recommended Lopressor 100 mg 3 times daily.  Outpatient cardioversion in 3 weeks if does not convert to NSR on his  own. -TSH normal  Acute metabolic encephalopathy superimposed on dementia, right MCA embolic appearing stroke -At baseline, patient is ambulatory without assistance, participates in cares, and is interactive, but has severe short term memory loss.  -Keppra was held placed on standard delirium precautions -Given lethargy on 10/24, CT head was done which showed acute/subacute cortical and subcortical right MCA territory infarct centered within the right frontal operculum/right insula,measuring 5.2 x 3.2 cm, petechial hemorrhage.  -Neurology was consulted, recommended holding Eliquis for now due to consideration for needing LP to rule out neurosyphilis -MRI brain showed intermediate-sized acute infarct of the right frontal operculum with moderate associated cytotoxic edema and petechial hemorrhage - normal intracranial MRA -PT OT, SLP following.  LDL 70 at goal.  Hemoglobin A1c 9 4 on 10/15 -2D echo 10/16 showed HOCM (previously known), RV SF normal -HIV negative, follow RPR -Much more alert and oriented, following commands, speech is somewhat dysarthric.   -Per neurology, repeat CT 1 week from initial scan, if stable, then resume apixaban  Seizure history -Per neurology, continue to hold Depakote due to transaminitis and Keppra -EEG normal, no seizures or epileptiform discharges  Hypertrophic cardiomyopathy, moderate MR -Initially treated with IV Lasix however  [facility]                                                                              Salem, is a 77 y.o. male, DOB - February 18, 1946, [id number] Admit date - 06/15/2023    Outpatient Primary MD for the patient is Kurth-Bowen, Cornelia, PA-C  LOS - 11  days  Chief Complaint  Patient presents with   Altered Mental Status       Brief summary   Eric Zamora is a 77 y.o. 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  Significant events: 10/15: Sent from SNF for tachpynea, found to have fever, no localizing source, admitted on antibiotics 10/17: Overnight with fever, aspiration event, new AG acidosis, transferred back to SDU for BiPAP and insulin drip 10/18: CT shows perirectal abscess, general surgery consulted; taken to OR for drainage of abscess 10/19: SLP advanced to Dys 1 diet, honey thick 10/20 to 10/22: Titrating up metoprolol for atrial fibrillation  Assessment & Plan    Principal Problem:   Severe sepsis due to perirectal abscess(HCC) -Presented with fever, leukocytosis, AKI, reported encephalopathy and hypotension.  UTI, pneumonia ruled out. -CT chest abdomen pelvis showed perirectal abscess  -General Surgery was consulted, status post I&D by Dr. Derrell Lolling on 06/18/23 -Continue Augmentin, doxycycline for 14 days - Penrose drain removed, surgery recommended to continue local wound care, sitz bath   Active problems Atrial fibrillation with RVR, new onset -New onset.  CHA2DS2-Vasc 6 (age, HTN, CHF, DM, vascular disease).  TSH normal.  Echo shows HOCM (previously known).  Normal valves. -Patient was placed on apixaban and Plavix was discontinued -Cardiology following, recommended Lopressor 100 mg 3 times daily.  Outpatient cardioversion in 3 weeks if does not convert to NSR on his  own. -TSH normal  Acute metabolic encephalopathy superimposed on dementia, right MCA embolic appearing stroke -At baseline, patient is ambulatory without assistance, participates in cares, and is interactive, but has severe short term memory loss.  -Keppra was held placed on standard delirium precautions -Given lethargy on 10/24, CT head was done which showed acute/subacute cortical and subcortical right MCA territory infarct centered within the right frontal operculum/right insula,measuring 5.2 x 3.2 cm, petechial hemorrhage.  -Neurology was consulted, recommended holding Eliquis for now due to consideration for needing LP to rule out neurosyphilis -MRI brain showed intermediate-sized acute infarct of the right frontal operculum with moderate associated cytotoxic edema and petechial hemorrhage - normal intracranial MRA -PT OT, SLP following.  LDL 70 at goal.  Hemoglobin A1c 9 4 on 10/15 -2D echo 10/16 showed HOCM (previously known), RV SF normal -HIV negative, follow RPR -Much more alert and oriented, following commands, speech is somewhat dysarthric.   -Per neurology, repeat CT 1 week from initial scan, if stable, then resume apixaban  Seizure history -Per neurology, continue to hold Depakote due to transaminitis and Keppra -EEG normal, no seizures or epileptiform discharges  Hypertrophic cardiomyopathy, moderate MR -Initially treated with IV Lasix however  was available and reviewed as appropriate. Description: The posterior dominant rhythm consists of 8 Hz activity of moderate voltage (25-35 uV) seen predominantly in posterior head regions, symmetric and reactive to eye opening and eye closing. Physiologic photic driving was not seen during photic stimulation.  Hyperventilation was not performed.   IMPRESSION: This study is within normal limits. No seizures or epileptiform discharges were seen throughout the recording. A normal interictal EEG does not exclude the diagnosis of epilepsy. [name]   VAS US CAROTID  Result Date: 06/25/2023 Carotid Arterial Duplex Study Patient Name:  QUATEZ HILT  Date of Exam:   06/25/2023 Medical Rec #: [id number]         Accession #:    [id number] Date of  Birth: December 21, 1945         Patient Gender: M Patient Age:   64 years Exam Location:  [facility] [place] Procedure:      VAS US CAROTID Referring Phys: Brooke Dare --------------------------------------------------------------------------------  Indications:       CVA. Risk Factors:      Hypertension, hyperlipidemia, Diabetes. Limitations        Today's exam was limited due to the patient's respiratory                    variation and patient positioning, patient movement. Comparison Study:  No prior studies. Performing Technologist: Chanda Busing RVT  Examination Guidelines: A complete evaluation includes B-mode imaging, spectral Doppler, color Doppler, and power Doppler as needed of all accessible portions of each vessel. Bilateral testing is considered an integral part of a complete examination. Limited examinations for reoccurring indications may be performed as noted.  Right Carotid Findings: +----------+--------+--------+--------+-----------------------+--------+           PSV cm/sEDV cm/sStenosisPlaque Description     Comments +----------+--------+--------+--------+-----------------------+--------+ CCA Prox  90      15              smooth and heterogenous         +----------+--------+--------+--------+-----------------------+--------+ CCA Distal65      12              smooth and heterogenous         +----------+--------+--------+--------+-----------------------+--------+ ICA Prox  65      22              smooth and heterogenous         +----------+--------+--------+--------+-----------------------+--------+ ICA Mid   93      29                                     tortuous +----------+--------+--------+--------+-----------------------+--------+ ICA Distal89      26                                     tortuous +----------+--------+--------+--------+-----------------------+--------+ ECA       75      7                                                +----------+--------+--------+--------+-----------------------+--------+ +----------+--------+-------+--------+-------------------+           PSV cm/sEDV cmsDescribeArm Pressure (mmHG) +----------+--------+-------+--------+-------------------+ [id number]                                         +----------+--------+-------+--------+-------------------+ +---------+--------+--+--------+-+---------+  was available and reviewed as appropriate. Description: The posterior dominant rhythm consists of 8 Hz activity of moderate voltage (25-35 uV) seen predominantly in posterior head regions, symmetric and reactive to eye opening and eye closing. Physiologic photic driving was not seen during photic stimulation.  Hyperventilation was not performed.   IMPRESSION: This study is within normal limits. No seizures or epileptiform discharges were seen throughout the recording. A normal interictal EEG does not exclude the diagnosis of epilepsy. [name]   VAS US CAROTID  Result Date: 06/25/2023 Carotid Arterial Duplex Study Patient Name:  QUATEZ HILT  Date of Exam:   06/25/2023 Medical Rec #: [id number]         Accession #:    [id number] Date of  Birth: December 21, 1945         Patient Gender: M Patient Age:   64 years Exam Location:  [facility] [place] Procedure:      VAS US CAROTID Referring Phys: Brooke Dare --------------------------------------------------------------------------------  Indications:       CVA. Risk Factors:      Hypertension, hyperlipidemia, Diabetes. Limitations        Today's exam was limited due to the patient's respiratory                    variation and patient positioning, patient movement. Comparison Study:  No prior studies. Performing Technologist: Chanda Busing RVT  Examination Guidelines: A complete evaluation includes B-mode imaging, spectral Doppler, color Doppler, and power Doppler as needed of all accessible portions of each vessel. Bilateral testing is considered an integral part of a complete examination. Limited examinations for reoccurring indications may be performed as noted.  Right Carotid Findings: +----------+--------+--------+--------+-----------------------+--------+           PSV cm/sEDV cm/sStenosisPlaque Description     Comments +----------+--------+--------+--------+-----------------------+--------+ CCA Prox  90      15              smooth and heterogenous         +----------+--------+--------+--------+-----------------------+--------+ CCA Distal65      12              smooth and heterogenous         +----------+--------+--------+--------+-----------------------+--------+ ICA Prox  65      22              smooth and heterogenous         +----------+--------+--------+--------+-----------------------+--------+ ICA Mid   93      29                                     tortuous +----------+--------+--------+--------+-----------------------+--------+ ICA Distal89      26                                     tortuous +----------+--------+--------+--------+-----------------------+--------+ ECA       75      7                                                +----------+--------+--------+--------+-----------------------+--------+ +----------+--------+-------+--------+-------------------+           PSV cm/sEDV cmsDescribeArm Pressure (mmHG) +----------+--------+-------+--------+-------------------+ [id number]                                         +----------+--------+-------+--------+-------------------+ +---------+--------+--+--------+-+---------+  was available and reviewed as appropriate. Description: The posterior dominant rhythm consists of 8 Hz activity of moderate voltage (25-35 uV) seen predominantly in posterior head regions, symmetric and reactive to eye opening and eye closing. Physiologic photic driving was not seen during photic stimulation.  Hyperventilation was not performed.   IMPRESSION: This study is within normal limits. No seizures or epileptiform discharges were seen throughout the recording. A normal interictal EEG does not exclude the diagnosis of epilepsy. [name]   VAS US CAROTID  Result Date: 06/25/2023 Carotid Arterial Duplex Study Patient Name:  QUATEZ HILT  Date of Exam:   06/25/2023 Medical Rec #: [id number]         Accession #:    [id number] Date of  Birth: December 21, 1945         Patient Gender: M Patient Age:   64 years Exam Location:  [facility] [place] Procedure:      VAS US CAROTID Referring Phys: Brooke Dare --------------------------------------------------------------------------------  Indications:       CVA. Risk Factors:      Hypertension, hyperlipidemia, Diabetes. Limitations        Today's exam was limited due to the patient's respiratory                    variation and patient positioning, patient movement. Comparison Study:  No prior studies. Performing Technologist: Chanda Busing RVT  Examination Guidelines: A complete evaluation includes B-mode imaging, spectral Doppler, color Doppler, and power Doppler as needed of all accessible portions of each vessel. Bilateral testing is considered an integral part of a complete examination. Limited examinations for reoccurring indications may be performed as noted.  Right Carotid Findings: +----------+--------+--------+--------+-----------------------+--------+           PSV cm/sEDV cm/sStenosisPlaque Description     Comments +----------+--------+--------+--------+-----------------------+--------+ CCA Prox  90      15              smooth and heterogenous         +----------+--------+--------+--------+-----------------------+--------+ CCA Distal65      12              smooth and heterogenous         +----------+--------+--------+--------+-----------------------+--------+ ICA Prox  65      22              smooth and heterogenous         +----------+--------+--------+--------+-----------------------+--------+ ICA Mid   93      29                                     tortuous +----------+--------+--------+--------+-----------------------+--------+ ICA Distal89      26                                     tortuous +----------+--------+--------+--------+-----------------------+--------+ ECA       75      7                                                +----------+--------+--------+--------+-----------------------+--------+ +----------+--------+-------+--------+-------------------+           PSV cm/sEDV cmsDescribeArm Pressure (mmHG) +----------+--------+-------+--------+-------------------+ [id number]                                         +----------+--------+-------+--------+-------------------+ +---------+--------+--+--------+-+---------+  [facility]                                                                              Sal[place], is a 77 y.o. male, DOB - February 18, 1946, [id number] Admit date - 06/15/2023    Outpatient Primary MD for the patient is Kurth-Bowen, Cornelia, PA-C  LOS - 11  days  Chief Complaint  Patient presents with   Altered Mental Status       Brief summary   Eric Zamora is a 77 y.o. 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  Significant events: 10/15: Sent from SNF for tachpynea, found to have fever, no localizing source, admitted on antibiotics 10/17: Overnight with fever, aspiration event, new AG acidosis, transferred back to SDU for BiPAP and insulin drip 10/18: CT shows perirectal abscess, general surgery consulted; taken to OR for drainage of abscess 10/19: SLP advanced to Dys 1 diet, honey thick 10/20 to 10/22: Titrating up metoprolol for atrial fibrillation  Assessment & Plan    Principal Problem:   Severe sepsis due to perirectal abscess(HCC) -Presented with fever, leukocytosis, AKI, reported encephalopathy and hypotension.  UTI, pneumonia ruled out. -CT chest abdomen pelvis showed perirectal abscess  -General Surgery was consulted, status post I&D by Dr. Derrell Lolling on 06/18/23 -Continue Augmentin, doxycycline for 14 days - Penrose drain removed, surgery recommended to continue local wound care, sitz bath   Active problems Atrial fibrillation with RVR, new onset -New onset.  CHA2DS2-Vasc 6 (age, HTN, CHF, DM, vascular disease).  TSH normal.  Echo shows HOCM (previously known).  Normal valves. -Patient was placed on apixaban and Plavix was discontinued -Cardiology following, recommended Lopressor 100 mg 3 times daily.  Outpatient cardioversion in 3 weeks if does not convert to NSR on his  own. -TSH normal  Acute metabolic encephalopathy superimposed on dementia, right MCA embolic appearing stroke -At baseline, patient is ambulatory without assistance, participates in cares, and is interactive, but has severe short term memory loss.  -Keppra was held placed on standard delirium precautions -Given lethargy on 10/24, CT head was done which showed acute/subacute cortical and subcortical right MCA territory infarct centered within the right frontal operculum/right insula,measuring 5.2 x 3.2 cm, petechial hemorrhage.  -Neurology was consulted, recommended holding Eliquis for now due to consideration for needing LP to rule out neurosyphilis -MRI brain showed intermediate-sized acute infarct of the right frontal operculum with moderate associated cytotoxic edema and petechial hemorrhage - normal intracranial MRA -PT OT, SLP following.  LDL 70 at goal.  Hemoglobin A1c 9 4 on 10/15 -2D echo 10/16 showed HOCM (previously known), RV SF normal -HIV negative, follow RPR -Much more alert and oriented, following commands, speech is somewhat dysarthric.   -Per neurology, repeat CT 1 week from initial scan, if stable, then resume apixaban  Seizure history -Per neurology, continue to hold Depakote due to transaminitis and Keppra -EEG normal, no seizures or epileptiform discharges  Hypertrophic cardiomyopathy, moderate MR -Initially treated with IV Lasix however  was available and reviewed as appropriate. Description: The posterior dominant rhythm consists of 8 Hz activity of moderate voltage (25-35 uV) seen predominantly in posterior head regions, symmetric and reactive to eye opening and eye closing. Physiologic photic driving was not seen during photic stimulation.  Hyperventilation was not performed.   IMPRESSION: This study is within normal limits. No seizures or epileptiform discharges were seen throughout the recording. A normal interictal EEG does not exclude the diagnosis of epilepsy. [name]   VAS US CAROTID  Result Date: 06/25/2023 Carotid Arterial Duplex Study Patient Name:  QUATEZ HILT  Date of Exam:   06/25/2023 Medical Rec #: [id number]         Accession #:    [id number] Date of  Birth: December 21, 1945         Patient Gender: M Patient Age:   64 years Exam Location:  [facility] [place] Procedure:      VAS US CAROTID Referring Phys: Brooke Dare --------------------------------------------------------------------------------  Indications:       CVA. Risk Factors:      Hypertension, hyperlipidemia, Diabetes. Limitations        Today's exam was limited due to the patient's respiratory                    variation and patient positioning, patient movement. Comparison Study:  No prior studies. Performing Technologist: Chanda Busing RVT  Examination Guidelines: A complete evaluation includes B-mode imaging, spectral Doppler, color Doppler, and power Doppler as needed of all accessible portions of each vessel. Bilateral testing is considered an integral part of a complete examination. Limited examinations for reoccurring indications may be performed as noted.  Right Carotid Findings: +----------+--------+--------+--------+-----------------------+--------+           PSV cm/sEDV cm/sStenosisPlaque Description     Comments +----------+--------+--------+--------+-----------------------+--------+ CCA Prox  90      15              smooth and heterogenous         +----------+--------+--------+--------+-----------------------+--------+ CCA Distal65      12              smooth and heterogenous         +----------+--------+--------+--------+-----------------------+--------+ ICA Prox  65      22              smooth and heterogenous         +----------+--------+--------+--------+-----------------------+--------+ ICA Mid   93      29                                     tortuous +----------+--------+--------+--------+-----------------------+--------+ ICA Distal89      26                                     tortuous +----------+--------+--------+--------+-----------------------+--------+ ECA       75      7                                                +----------+--------+--------+--------+-----------------------+--------+ +----------+--------+-------+--------+-------------------+           PSV cm/sEDV cmsDescribeArm Pressure (mmHG) +----------+--------+-------+--------+-------------------+ [id number]                                         +----------+--------+-------+--------+-------------------+ +---------+--------+--+--------+-+---------+  was available and reviewed as appropriate. Description: The posterior dominant rhythm consists of 8 Hz activity of moderate voltage (25-35 uV) seen predominantly in posterior head regions, symmetric and reactive to eye opening and eye closing. Physiologic photic driving was not seen during photic stimulation.  Hyperventilation was not performed.   IMPRESSION: This study is within normal limits. No seizures or epileptiform discharges were seen throughout the recording. A normal interictal EEG does not exclude the diagnosis of epilepsy. [name]   VAS US CAROTID  Result Date: 06/25/2023 Carotid Arterial Duplex Study Patient Name:  QUATEZ HILT  Date of Exam:   06/25/2023 Medical Rec #: [id number]         Accession #:    [id number] Date of  Birth: December 21, 1945         Patient Gender: M Patient Age:   64 years Exam Location:  [facility] [place] Procedure:      VAS US CAROTID Referring Phys: Brooke Dare --------------------------------------------------------------------------------  Indications:       CVA. Risk Factors:      Hypertension, hyperlipidemia, Diabetes. Limitations        Today's exam was limited due to the patient's respiratory                    variation and patient positioning, patient movement. Comparison Study:  No prior studies. Performing Technologist: Chanda Busing RVT  Examination Guidelines: A complete evaluation includes B-mode imaging, spectral Doppler, color Doppler, and power Doppler as needed of all accessible portions of each vessel. Bilateral testing is considered an integral part of a complete examination. Limited examinations for reoccurring indications may be performed as noted.  Right Carotid Findings: +----------+--------+--------+--------+-----------------------+--------+           PSV cm/sEDV cm/sStenosisPlaque Description     Comments +----------+--------+--------+--------+-----------------------+--------+ CCA Prox  90      15              smooth and heterogenous         +----------+--------+--------+--------+-----------------------+--------+ CCA Distal65      12              smooth and heterogenous         +----------+--------+--------+--------+-----------------------+--------+ ICA Prox  65      22              smooth and heterogenous         +----------+--------+--------+--------+-----------------------+--------+ ICA Mid   93      29                                     tortuous +----------+--------+--------+--------+-----------------------+--------+ ICA Distal89      26                                     tortuous +----------+--------+--------+--------+-----------------------+--------+ ECA       75      7                                                +----------+--------+--------+--------+-----------------------+--------+ +----------+--------+-------+--------+-------------------+           PSV cm/sEDV cmsDescribeArm Pressure (mmHG) +----------+--------+-------+--------+-------------------+ [id number]                                         +----------+--------+-------+--------+-------------------+ +---------+--------+--+--------+-+---------+

## 2023-06-26 NOTE — Plan of Care (Signed)
  Problem: Coping: Goal: Ability to adjust to condition or change in health will improve Outcome: Progressing   Problem: Fluid Volume: Goal: Ability to maintain a balanced intake and output will improve Outcome: Progressing   Problem: Education: Goal: Knowledge of General Education information will improve Description: Including pain rating scale, medication(s)/side effects and non-pharmacologic comfort measures Outcome: Progressing   Problem: Education: Goal: Knowledge of disease or condition will improve Outcome: Progressing Goal: Knowledge of secondary prevention will improve (MUST DOCUMENT ALL) Outcome: Progressing Goal: Knowledge of patient specific risk factors will improve Loraine Leriche N/A or DELETE if not current risk factor) Outcome: Progressing

## 2023-06-26 NOTE — Plan of Care (Signed)
Problem: Education: Goal: Ability to describe self-care measures that may prevent or decrease complications (Diabetes Survival Skills Education) will improve Outcome: Progressing Goal: Individualized Educational Video(s) Outcome: Progressing   Problem: Coping: Goal: Ability to adjust to condition or change in health will improve Outcome: Progressing   Problem: Fluid Volume: Goal: Ability to maintain a balanced intake and output will improve Outcome: Progressing   Problem: Health Behavior/Discharge Planning: Goal: Ability to identify and utilize available resources and services will improve Outcome: Progressing Goal: Ability to manage health-related needs will improve Outcome: Progressing   Problem: Metabolic: Goal: Ability to maintain appropriate glucose levels will improve Outcome: Progressing   Problem: Nutritional: Goal: Maintenance of adequate nutrition will improve Outcome: Progressing Goal: Progress toward achieving an optimal weight will improve Outcome: Progressing   Problem: Skin Integrity: Goal: Risk for impaired skin integrity will decrease Outcome: Progressing   Problem: Tissue Perfusion: Goal: Adequacy of tissue perfusion will improve Outcome: Progressing   Problem: Education: Goal: Knowledge of General Education information will improve Description: Including pain rating scale, medication(s)/side effects and non-pharmacologic comfort measures Outcome: Progressing   Problem: Health Behavior/Discharge Planning: Goal: Ability to manage health-related needs will improve Outcome: Progressing   Problem: Clinical Measurements: Goal: Ability to maintain clinical measurements within normal limits will improve Outcome: Progressing Goal: Will remain free from infection Outcome: Progressing Goal: Diagnostic test results will improve Outcome: Progressing Goal: Respiratory complications will improve Outcome: Progressing Goal: Cardiovascular complication will  be avoided Outcome: Progressing   Problem: Activity: Goal: Risk for activity intolerance will decrease Outcome: Progressing   Problem: Nutrition: Goal: Adequate nutrition will be maintained Outcome: Progressing   Problem: Coping: Goal: Level of anxiety will decrease Outcome: Progressing   Problem: Elimination: Goal: Will not experience complications related to bowel motility Outcome: Progressing Goal: Will not experience complications related to urinary retention Outcome: Progressing   Problem: Pain Managment: Goal: General experience of comfort will improve Outcome: Progressing   Problem: Safety: Goal: Ability to remain free from injury will improve Outcome: Progressing   Problem: Skin Integrity: Goal: Risk for impaired skin integrity will decrease Outcome: Progressing   Problem: Fluid Volume: Goal: Hemodynamic stability will improve Outcome: Progressing   Problem: Clinical Measurements: Goal: Diagnostic test results will improve Outcome: Progressing Goal: Signs and symptoms of infection will decrease Outcome: Progressing   Problem: Respiratory: Goal: Ability to maintain adequate ventilation will improve Outcome: Progressing   Problem: Education: Goal: Ability to describe self-care measures that may prevent or decrease complications (Diabetes Survival Skills Education) will improve Outcome: Progressing Goal: Individualized Educational Video(s) Outcome: Progressing   Problem: Cardiac: Goal: Ability to maintain an adequate cardiac output will improve Outcome: Progressing   Problem: Health Behavior/Discharge Planning: Goal: Ability to identify and utilize available resources and services will improve Outcome: Progressing Goal: Ability to manage health-related needs will improve Outcome: Progressing   Problem: Fluid Volume: Goal: Ability to achieve a balanced intake and output will improve Outcome: Progressing   Problem: Metabolic: Goal: Ability to  maintain appropriate glucose levels will improve Outcome: Progressing   Problem: Nutritional: Goal: Maintenance of adequate nutrition will improve Outcome: Progressing Goal: Maintenance of adequate weight for body size and type will improve Outcome: Progressing   Problem: Respiratory: Goal: Will regain and/or maintain adequate ventilation Outcome: Progressing   Problem: Urinary Elimination: Goal: Ability to achieve and maintain adequate renal perfusion and functioning will improve Outcome: Progressing   Problem: Education: Goal: Knowledge of disease or condition will improve Outcome: Progressing Goal: Knowledge of secondary prevention will improve (  MUST DOCUMENT ALL) Outcome: Progressing Goal: Knowledge of patient specific risk factors will improve Loraine Leriche N/A or DELETE if not current risk factor) Outcome: Progressing   Problem: Ischemic Stroke/TIA Tissue Perfusion: Goal: Complications of ischemic stroke/TIA will be minimized Outcome: Progressing

## 2023-06-26 NOTE — Progress Notes (Signed)
[facility] Progress Note    Assessment/ Plan:   Acute kidney injury on CKD stage IIIb likely hemodynamically mediated due recent use of diuretics/ intravascular volume depletion and sepsis.  Recent CT scan ruled out hydronephrosis. -Baseline Cr around 1.5. Cr improved-down to 1.58 today-close to baseline -c/w hypotonic fluids especially in light of hypernatremia -Avoid nephrotoxic medications including NSAIDs and iodinated intravenous contrast exposure unless the latter is absolutely indicated.  Preferred narcotic agents for pain control are hydromorphone, fentanyl, and methadone. Morphine should not be used. Avoid Baclofen and avoid oral sodium phosphate and magnesium citrate based laxatives / bowel preps. Continue strict Input and Output monitoring. Will monitor the patient closely with you and intervene or adjust therapy as indicated by changes in clinical status/labs    Hypernatremia, hypovolemic: Hypernatremia worsening because of Lasix use.  He is off of Lasix.   -Na stable 151. Hypotonic fluids: increasing rate to 125cc/hr x 1 day -free water deficit today is around 3.1L. May need a bolus but holding off as I did encourage him to stay hydrated with water   Acute stroke/acute metabolic encephalopathy -per primary   Severe sepsis due to perirectal abscess status post I&D  -Currently on Augmentin and doxycycline.surgery following   A-fib with RVR: Seen by cardiologist.  On metoprolol and eliquis.  Monitor BP and heart rate.  HTN: BP currently acceptable  Uncontrolled DM2 -per primary service  Subjective:   Patient seen and examined bedside. UOP charted: ~0.7L. Dysarthric today but following commands. Denies chest pain, SOB when prompted   Objective:   BP 96/69 (BP Location: Right Arm)   Pulse 99   Temp 98.2 F (36.8 C) (Oral)   Resp 20   Ht 5\' 5"  (1.651 m)   Wt 80.1 kg   SpO2 100%   BMI 29.39 kg/m   Intake/Output Summary (Last 24 hours) at 06/26/2023  1248 Last data filed at 06/26/2023 1244 Gross per 24 hour  Intake 1258.77 ml  Output 1050 ml  Net 208.77 ml   Weight change:   Physical Exam: Gen: ill appearing, laying flat in bed, NAD HEENT: dry MM CVS: RRR Resp: CTA B/L Abd: soft Ext: no edema Neuro: awake, following commands  Imaging: EEG adult  Result Date: 06/25/2023 Charlsie Quest, MD     06/25/2023  5:20 PM Patient Name: MAX SWEENY MRN: [id number] Epilepsy Attending: Charlsie Quest Referring Physician/Provider: Gordy Councilman, MD Date: 06/25/2023 Duration: 24.42 mins Patient history:  77 year old gentleman presenting with sepsis in the setting of perirectal abscess s/p incision and drainage with hospital course complicated by aspiration events, new onset A-fib with RVR, new diagnosis of diabetes, unclear history of seizures. EEG to evaluate for seizure. Level of alertness: Awake AEDs during EEG study: None Technical aspects: This EEG study was done with scalp electrodes positioned according to the 10-20 International system of electrode placement. Electrical activity was reviewed with band pass filter of 1-70Hz , sensitivity of 7 uV/mm, display speed of 71mm/sec with a 60Hz  notched filter applied as appropriate. EEG data were recorded continuously and digitally stored.  Video monitoring was available and reviewed as appropriate. Description: The posterior dominant rhythm consists of 8 Hz activity of moderate voltage (25-35 uV) seen predominantly in posterior head regions, symmetric and reactive to eye opening and eye closing. Physiologic photic driving was not seen during photic stimulation.  Hyperventilation was not performed.   IMPRESSION: This study is within normal limits. No seizures or epileptiform discharges were seen throughout the recording.  A normal interictal EEG does not exclude the diagnosis of epilepsy. [name]   VAS US CAROTID  Result Date: 06/25/2023 Carotid Arterial Duplex Study Patient Name:   DANYA ONEILL  Date of Exam:   06/25/2023 Medical Rec #: [id number]         Accession #:    [id number] Date of Birth: [birth year]         Patient Gender: M Patient Age:   77 years Exam Location:  [facility] Procedure:      VAS US CAROTID Referring Phys: Brooke Dare --------------------------------------------------------------------------------  Indications:       CVA. Risk Factors:      Hypertension, hyperlipidemia, Diabetes. Limitations        Today's exam was limited due to the patient's respiratory                    variation and patient positioning, patient movement. Comparison Study:  No prior studies. Performing Technologist: Chanda Busing RVT  Examination Guidelines: A complete evaluation includes B-mode imaging, spectral Doppler, color Doppler, and power Doppler as needed of all accessible portions of each vessel. Bilateral testing is considered an integral part of a complete examination. Limited examinations for reoccurring indications may be performed as noted.  Right Carotid Findings: +----------+--------+--------+--------+-----------------------+--------+           PSV cm/sEDV cm/sStenosisPlaque Description     Comments +----------+--------+--------+--------+-----------------------+--------+ CCA Prox  90      15              smooth and heterogenous         +----------+--------+--------+--------+-----------------------+--------+ CCA Distal65      12              smooth and heterogenous         +----------+--------+--------+--------+-----------------------+--------+ ICA Prox  65      22              smooth and heterogenous         +----------+--------+--------+--------+-----------------------+--------+ ICA Mid   93      29                                     tortuous +----------+--------+--------+--------+-----------------------+--------+ ICA Distal89      26                                     tortuous  +----------+--------+--------+--------+-----------------------+--------+ ECA       75      7                                               +----------+--------+--------+--------+-----------------------+--------+ +----------+--------+-------+--------+-------------------+           PSV cm/sEDV cmsDescribeArm Pressure (mmHG) +----------+--------+-------+--------+-------------------+ [id number]                                         +----------+--------+-------+--------+-------------------+ +---------+--------+--+--------+-+---------+ VertebralPSV cm/s41EDV cm/s3Antegrade +---------+--------+--+--------+-+---------+  Left Carotid Findings: +----------+--------+--------+--------+-----------------------+--------+           PSV cm/sEDV cm/sStenosisPlaque Description     Comments +----------+--------+--------+--------+-----------------------+--------+ CCA Prox  83  18              smooth and heterogenous         +----------+--------+--------+--------+-----------------------+--------+ CCA Distal80      17              smooth and heterogenous         +----------+--------+--------+--------+-----------------------+--------+ ICA Prox  55      18              smooth and heterogenous         +----------+--------+--------+--------+-----------------------+--------+ ICA Mid   78      24                                     tortuous +----------+--------+--------+--------+-----------------------+--------+ ICA Distal51      20                                     tortuous +----------+--------+--------+--------+-----------------------+--------+ ECA       58      7                                               +----------+--------+--------+--------+-----------------------+--------+ +----------+--------+--------+--------+-------------------+           PSV cm/sEDV cm/sDescribeArm Pressure (mmHG) +----------+--------+--------+--------+-------------------+  [id number]                                         +----------+--------+--------+--------+-------------------+ +---------+--------+--+--------+-+---------+ VertebralPSV cm/s31EDV cm/s6Antegrade +---------+--------+--+--------+-+---------+   Summary: Right Carotid: Velocities in the right ICA are consistent with a 1-39% stenosis. Left Carotid: Velocities in the left ICA are consistent with a 1-39% stenosis. Vertebrals: Bilateral vertebral arteries demonstrate antegrade flow. *See table(s) above for measurements and observations.  Electronically signed by Delia Heady MD on 06/25/2023 at 4:49:43 PM.    Final    MR BRAIN WO CONTRAST  Result Date: 06/25/2023 CLINICAL DATA:  Stroke/TIA EXAM: MRI HEAD WITHOUT CONTRAST MRA HEAD WITHOUT CONTRAST TECHNIQUE: Multiplanar, multi-echo pulse sequences of the brain and surrounding structures were acquired without intravenous contrast. Angiographic images of the Circle of Willis were acquired using MRA technique without intravenous contrast. COMPARISON:  None Available. FINDINGS: MRI HEAD FINDINGS Brain: Intermediate sized region abnormal diffusion restriction in the right frontal operculum. Moderate associated cytotoxic edema and petechial hemorrhage. Mild generalized volume loss. The midline structures are normal. 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 Vascular: Normal flow voids Skull and upper cervical spine: Normal calvarium and skull base. Visualized upper cervical spine and soft tissues are normal. Sinuses/Orbits:No paranasal sinus fluid levels or advanced mucosal thickening. No mastoid or middle ear effusion. Normal orbits. MRA HEAD FINDINGS POSTERIOR CIRCULATION: --Vertebral arteries: Normal --Inferior cerebellar arteries: Normal. --Basilar artery: Normal. --Superior cerebellar arteries: Normal. --Posterior cerebral arteries: Normal. ANTERIOR CIRCULATION: --Intracranial internal carotid arteries: Normal. --Anterior cerebral arteries (ACA):  Normal. --Middle cerebral arteries (MCA): Normal. Anatomic variants: Fetal origin of the right PCA IMPRESSION: 1. Intermediate sized acute infarct of the right frontal operculum with moderate associated cytotoxic edema and petechial hemorrhage. 2. Normal intracranial MRA. Electronically Signed   By: Deatra Robinson M.D.   On: 06/25/2023 01:31   MR  ANGIO HEAD WO CONTRAST  Result Date: 06/25/2023 CLINICAL DATA:  Stroke/TIA EXAM: MRI HEAD WITHOUT CONTRAST MRA HEAD WITHOUT CONTRAST TECHNIQUE: Multiplanar, multi-echo pulse sequences of the brain and surrounding structures were acquired without intravenous contrast. Angiographic images of the Circle of Willis were acquired using MRA technique without intravenous contrast. COMPARISON:  None Available. FINDINGS: MRI HEAD FINDINGS Brain: Intermediate sized region abnormal diffusion restriction in the right frontal operculum. Moderate associated cytotoxic edema and petechial hemorrhage. Mild generalized volume loss. The midline structures are normal. 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 Vascular: Normal flow voids Skull and upper cervical spine: Normal calvarium and skull base. Visualized upper cervical spine and soft tissues are normal. Sinuses/Orbits:No paranasal sinus fluid levels or advanced mucosal thickening. No mastoid or middle ear effusion. Normal orbits. MRA HEAD FINDINGS POSTERIOR CIRCULATION: --Vertebral arteries: Normal --Inferior cerebellar arteries: Normal. --Basilar artery: Normal. --Superior cerebellar arteries: Normal. --Posterior cerebral arteries: Normal. ANTERIOR CIRCULATION: --Intracranial internal carotid arteries: Normal. --Anterior cerebral arteries (ACA): Normal. --Middle cerebral arteries (MCA): Normal. Anatomic variants: Fetal origin of the right PCA IMPRESSION: 1. Intermediate sized acute infarct of the right frontal operculum with moderate associated cytotoxic edema and petechial hemorrhage. 2. Normal intracranial MRA.  Electronically Signed   By: Deatra Robinson M.D.   On: 06/25/2023 01:31   CT HEAD WO CONTRAST ( )  Addendum Date: 06/24/2023   ADDENDUM REPORT: 06/24/2023 20:44 ADDENDUM: Impression #1 called by telephone at the time of interpretation on 06/24/2023 at 2:57 pm to provider RIPUDEEP RAI , who verbally acknowledged these results. Electronically Signed   By: Jackey Loge D.O.   On: 06/24/2023 20:44   Result Date: 06/24/2023 CLINICAL DATA:  Provided history: Mental status change, unknown cause. EXAM: CT HEAD WITHOUT CONTRAST TECHNIQUE: Contiguous axial images were obtained from the base of the skull through the vertex without intravenous contrast. RADIATION DOSE REDUCTION: This exam was performed according to the departmental dose-optimization program which includes automated exposure control, adjustment of the mA and/or kV according to patient size and/or use of iterative reconstruction technique. COMPARISON:  Prior head CT examinations 06/15/2023 and earlier. FINDINGS: Brain: Generalized cerebral atrophy. Commensurate prominence of the ventricles and sulci. Acute/subacute cortical and subcortical right MCA territory infarct centered within the right frontal operculum/right insula, measuring 5.2 x 3.2 cm in transaxial dimensions. Curvilinear and ill-defined hyperdensity within portions of the infarction territory, likely reflecting petechial hemorrhage. Local mass effect without midline shift or significant ventricular effacement. Chronic lacunar infarct again noted within the right caudate nucleus. No extra-axial fluid collection. No evidence of an intracranial mass. Vascular: No definite hyperdense vessel. Atherosclerotic calcifications. Skull: No calvarial fracture or aggressive osseous lesion. Sinuses/Orbits: No mass or acute finding within the imaged orbits. Opacification of a single left ethmoid air cell at the imaged levels. Attempts are being made through the ordering provider at this time. IMPRESSION: 1.  Acute/subacute cortical and subcortical right MCA territory infarct centered within the right frontal operculum/right insula, measuring 5.2 x 3.2 cm in transaxial dimensions. Curvilinear and ill-defined hyperdensity within portions of the infarction territory likely reflecting petechial hemorrhage. Local mass effect without midline shift or significant ventricular effacement. 2. Chronic lacunar infarct within the right caudate nucleus. 3. Cerebral atrophy. Electronically Signed: By: Jackey Loge D.O. On: 06/24/2023 14:55    Labs: BMET Recent Labs  Lab 06/20/23 0302 06/21/23 0544 06/22/23 0613 06/23/23 0544 06/24/23 1245 06/25/23 0559 06/26/23 0906  NA 145 144 149* 151* 152* 151* 151*  K 3.7 3.9 3.5 3.8 3.8 3.8 3.9  CL  119* 116* 118* 123* 123* 123* 123*  CO2 20* 17* 22 20* 20* 19* 20*  GLUCOSE 91 115* 146* 162* 217* 104* 159*  BUN 26* 22 35* 41* 42* 36* 33*  CREATININE 1.44* 1.50* 1.84* 1.99* 2.06* 1.71* 1.58*  CALCIUM 7.8* 7.8* 8.3* 8.7* 8.6* 8.6* 8.6*   CBC Recent Labs  Lab 06/22/23 0613 06/23/23 0544 06/25/23 0559 06/26/23 0814  WBC 15.6* 15.6* 10.8* 9.2  HGB 13.1 14.0 13.8 15.0  HCT 40.4 42.9 44.5 49.4  MCV 87.4 89.7 93.5 93.9  PLT 369 364 311 298    Medications:     amoxicillin-clavulanate  1 tablet Oral Q12H   doxycycline  100 mg Oral Q12H   feeding supplement  237 mL Oral BID BM   insulin aspart  0-15 Units Subcutaneous TID WC   insulin aspart  0-5 Units Subcutaneous QHS   insulin aspart  3 Units Subcutaneous TID WC   insulin glargine-yfgn  22 Units Subcutaneous Q24H   metoprolol tartrate  100 mg Oral TID   nystatin  5 mL Oral QID   mouth rinse  15 mL Mouth Rinse 4 times per day   sodium chloride flush  3 mL Intravenous Q12H      Anthony Sar, MD [facility] 06/26/2023, 12:48 PM

## 2023-06-26 NOTE — Evaluation (Signed)
Occupational Therapy Evaluation Patient Details Name: Eric Zamora MRN: [id number] DOB: [birth year] Today's Date: 06/26/2023   History of Present Illness Eric Zamora is a 77 y.o. male s/p I&D perirectal abscess 10/18, admitted with severe sepsis due to perirectal abscess. PMH: bipolar disorder, dementia, history of NSTEMI, hypertrophic cardiomyopathy   Clinical Impression   Per chart review, pt from [place] and needing increased assistance with mobility and ADLs. Pt with unintelligible speech and uncertain of what he's verbalizing when asked PLOF questions and what is baseline is at this time. Pt able to sit EOB with min A to participate in grooming, UB dressing and simulated bathing tasks. Pt declined OOB activity/transfers to chair. HR WNLs throughout session. Pt would benefit from acute OT services to address impairments to maximize level of function and safety      If plan is discharge home, recommend the following: A lot of help with bathing/dressing/bathroom;Direct supervision/assist for medications management;Assist for transportation    Functional Status Assessment  Patient has had a recent decline in their functional status and demonstrates the ability to make significant improvements in function in a reasonable and predictable amount of time.  Equipment Recommendations  None recommended by OT    Recommendations for Other Services       Precautions / Restrictions Precautions Precautions: Fall Precaution Comments: monitor HR Restrictions Weight Bearing Restrictions: No      Mobility Bed Mobility Overal bed mobility: Needs Assistance Bed Mobility: Rolling, Supine to Sit, Sit to Supine Rolling: Min assist   Supine to sit: Min assist Sit to supine: Min assist   General bed mobility comments: increaesed time and effort    Transfers                   General transfer comment: pt declined      Balance Overall balance assessment: Needs  assistance Sitting-balance support: Bilateral upper extremity supported Sitting balance-Leahy Scale: Fair                                     ADL either performed or assessed with clinical judgement   ADL Overall ADL's : Needs assistance/impaired     Grooming: Wash/dry hands;Wash/dry face;Contact guard assist;Sitting   Upper Body Bathing: Minimal assistance;Sitting   Lower Body Bathing: Maximal assistance   Upper Body Dressing : Minimal assistance;Sitting   Lower Body Dressing: Maximal assistance                       Vision Baseline Vision/History: 1 Wears glasses Ability to See in Adequate Light: 0 Adequate Patient Visual Report: No change from baseline       Perception         Praxis         Pertinent Vitals/Pain Pain Assessment Pain Assessment: No/denies pain Pain Score: 0-No pain     Extremity/Trunk Assessment Upper Extremity Assessment Upper Extremity Assessment: Generalized weakness   Lower Extremity Assessment Lower Extremity Assessment: Defer to PT evaluation   Cervical / Trunk Assessment Cervical / Trunk Assessment: Normal   Communication Communication Communication: Difficulty communicating thoughts/reduced clarity of speech Following commands: Follows one step commands consistently   Cognition   Behavior During Therapy: WFL for tasks assessed/performed Overall Cognitive Status: No family/caregiver present to determine baseline cognitive functioning  General Comments: pt follows 1 step commands with increased time and cues, unintelligble speech     General Comments       Exercises     Shoulder Instructions      Home Living Family/patient expects to be discharged to:: Skilled nursing facility                                 Additional Comments: Pt states "yes ma'am" to all questions, per chart review from [facility], pt needing  increased assistance with mobility and ADLs      Prior Functioning/Environment Prior Level of Function : Patient poor historian/Family not available             Mobility Comments: per chart review from [facility], pt needing increased assistance with mobility and ADLs ADLs Comments: per chart review from [facility], pt needing increased assistance with mobility and ADLs        OT Problem List: Decreased strength;Impaired balance (sitting and/or standing);Decreased cognition;Decreased activity tolerance;Decreased knowledge of use of DME or AE;Decreased safety awareness      OT Treatment/Interventions: Self-care/ADL training;DME and/or AE instruction;Therapeutic activities;Therapeutic exercise;Patient/family education    OT Goals(Current goals can be found in the care plan section) Acute Rehab OT Goals Patient Stated Goal: none stated OT Goal Formulation: With patient Time For Goal Achievement: 07/10/23 Potential to Achieve Goals: Good ADL Goals Pt Will Perform Grooming: with supervision;with set-up;sitting Pt Will Perform Upper Body Bathing: with contact guard assist;with supervision;sitting Pt Will Perform Upper Body Dressing: with contact guard assist;with supervision;sitting Pt Will Transfer to Toilet: with mod assist;stand pivot transfer  OT Frequency: Min 1X/week    Co-evaluation              AM-PAC OT "6 Clicks" Daily Activity     Outcome Measure Help from another person eating meals?: None Help from another person taking care of personal grooming?: A Little Help from another person toileting, which includes using toliet, bedpan, or urinal?: A Lot Help from another person bathing (including washing, rinsing, drying)?: A Lot Help from another person to put on and taking off regular upper body clothing?: A Little Help from another person to put on and taking off regular lower body clothing?: A Lot 6 Click Score: 16   End of  Session    Activity Tolerance: Patient limited by fatigue Patient left: in bed;with call bell/phone within reach;with bed alarm set  OT Visit Diagnosis: Other abnormalities of gait and mobility (R26.89);Muscle weakness (generalized) (M62.81);Cognitive communication deficit (R41.841)                Time: 1610-9604 OT Time Calculation (min): 19 min Charges:  OT General Charges $OT Visit: 1 Visit OT Evaluation $OT Eval Moderate Complexity: 1 Mod    Galen Manila 06/26/2023, 12:22 PM

## 2023-06-26 NOTE — Plan of Care (Signed)
Problem: Education: Goal: Ability to describe self-care measures that may prevent or decrease complications (Diabetes Survival Skills Education) will improve 06/26/2023 1027 by Charmayne Sheer, LPN Outcome: Progressing 06/26/2023 0737 by Charmayne Sheer, LPN Outcome: Progressing Goal: Individualized Educational Video(s) 06/26/2023 1027 by Charmayne Sheer, LPN Outcome: Progressing 06/26/2023 0737 by Charmayne Sheer, LPN Outcome: Progressing   Problem: Coping: Goal: Ability to adjust to condition or change in health will improve 06/26/2023 1027 by Charmayne Sheer, LPN Outcome: Progressing 06/26/2023 0737 by Charmayne Sheer, LPN Outcome: Progressing   Problem: Fluid Volume: Goal: Ability to maintain a balanced intake and output will improve 06/26/2023 1027 by Charmayne Sheer, LPN Outcome: Progressing 06/26/2023 0737 by Charmayne Sheer, LPN Outcome: Progressing   Problem: Health Behavior/Discharge Planning: Goal: Ability to identify and utilize available resources and services will improve 06/26/2023 1027 by Charmayne Sheer, LPN Outcome: Progressing 06/26/2023 0737 by Charmayne Sheer, LPN Outcome: Progressing Goal: Ability to manage health-related needs will improve 06/26/2023 1027 by Charmayne Sheer, LPN Outcome: Progressing 06/26/2023 0737 by Charmayne Sheer, LPN Outcome: Progressing   Problem: Metabolic: Goal: Ability to maintain appropriate glucose levels will improve 06/26/2023 1027 by Charmayne Sheer, LPN Outcome: Progressing 06/26/2023 0737 by Charmayne Sheer, LPN Outcome: Progressing   Problem: Nutritional: Goal: Maintenance of adequate nutrition will improve 06/26/2023 1027 by Charmayne Sheer, LPN Outcome: Progressing 06/26/2023 0737 by Charmayne Sheer, LPN Outcome: Progressing Goal: Progress toward achieving an optimal weight will improve 06/26/2023 1027 by Charmayne Sheer, LPN Outcome: Progressing 06/26/2023 0737 by Charmayne Sheer, LPN Outcome: Progressing   Problem: Skin  Integrity: Goal: Risk for impaired skin integrity will decrease 06/26/2023 1027 by Charmayne Sheer, LPN Outcome: Progressing 06/26/2023 0737 by Charmayne Sheer, LPN Outcome: Progressing   Problem: Tissue Perfusion: Goal: Adequacy of tissue perfusion will improve 06/26/2023 1027 by Charmayne Sheer, LPN Outcome: Progressing 06/26/2023 0737 by Charmayne Sheer, LPN Outcome: Progressing   Problem: Education: Goal: Knowledge of General Education information will improve Description: Including pain rating scale, medication(s)/side effects and non-pharmacologic comfort measures 06/26/2023 1027 by Charmayne Sheer, LPN Outcome: Progressing 06/26/2023 0737 by Charmayne Sheer, LPN Outcome: Progressing   Problem: Health Behavior/Discharge Planning: Goal: Ability to manage health-related needs will improve 06/26/2023 1027 by Charmayne Sheer, LPN Outcome: Progressing 06/26/2023 0737 by Charmayne Sheer, LPN Outcome: Progressing   Problem: Clinical Measurements: Goal: Ability to maintain clinical measurements within normal limits will improve 06/26/2023 1027 by Charmayne Sheer, LPN Outcome: Progressing 06/26/2023 0737 by Charmayne Sheer, LPN Outcome: Progressing Goal: Will remain free from infection 06/26/2023 1027 by Charmayne Sheer, LPN Outcome: Progressing 06/26/2023 0737 by Charmayne Sheer, LPN Outcome: Progressing Goal: Diagnostic test results will improve 06/26/2023 1027 by Charmayne Sheer, LPN Outcome: Progressing 06/26/2023 0737 by Charmayne Sheer, LPN Outcome: Progressing Goal: Respiratory complications will improve 06/26/2023 1027 by Charmayne Sheer, LPN Outcome: Progressing 06/26/2023 0737 by Charmayne Sheer, LPN Outcome: Progressing Goal: Cardiovascular complication will be avoided 06/26/2023 1027 by Charmayne Sheer, LPN Outcome: Progressing 06/26/2023 0737 by Charmayne Sheer, LPN Outcome: Progressing   Problem: Activity: Goal: Risk for activity intolerance will decrease 06/26/2023 1027 by Charmayne Sheer, LPN Outcome: Progressing 06/26/2023 0737 by Charmayne Sheer, LPN Outcome: Progressing   Problem: Nutrition: Goal: Adequate nutrition will be maintained 06/26/2023 1027 by Charmayne Sheer, LPN Outcome: Progressing 06/26/2023 0737 by Charmayne Sheer, LPN Outcome: Progressing   Problem: Coping: Goal: Level of anxiety will decrease 06/26/2023 1027 by Charmayne Sheer, LPN Outcome: Progressing 06/26/2023 0737 by Charmayne Sheer, LPN Outcome: Progressing   Problem: Elimination: Goal: Will not experience complications related to bowel motility 06/26/2023 1027  by Charmayne Sheer, LPN Outcome: Progressing 06/26/2023 0737 by Charmayne Sheer, LPN Outcome: Progressing Goal: Will not experience complications related to urinary retention 06/26/2023 1027 by Charmayne Sheer, LPN Outcome: Progressing 06/26/2023 0737 by Charmayne Sheer, LPN Outcome: Progressing   Problem: Pain Managment: Goal: General experience of comfort will improve 06/26/2023 1027 by Charmayne Sheer, LPN Outcome: Progressing 06/26/2023 0737 by Charmayne Sheer, LPN Outcome: Progressing   Problem: Safety: Goal: Ability to remain free from injury will improve 06/26/2023 1027 by Charmayne Sheer, LPN Outcome: Progressing 06/26/2023 0737 by Charmayne Sheer, LPN Outcome: Progressing   Problem: Skin Integrity: Goal: Risk for impaired skin integrity will decrease 06/26/2023 1027 by Charmayne Sheer, LPN Outcome: Progressing 06/26/2023 0737 by Charmayne Sheer, LPN Outcome: Progressing   Problem: Fluid Volume: Goal: Hemodynamic stability will improve 06/26/2023 1027 by Charmayne Sheer, LPN Outcome: Progressing 06/26/2023 0737 by Charmayne Sheer, LPN Outcome: Progressing   Problem: Clinical Measurements: Goal: Diagnostic test results will improve 06/26/2023 1027 by Charmayne Sheer, LPN Outcome: Progressing 06/26/2023 0737 by Charmayne Sheer, LPN Outcome: Progressing Goal: Signs and symptoms of infection will decrease 06/26/2023 1027 by  Charmayne Sheer, LPN Outcome: Progressing 06/26/2023 0737 by Charmayne Sheer, LPN Outcome: Progressing   Problem: Respiratory: Goal: Ability to maintain adequate ventilation will improve 06/26/2023 1027 by Charmayne Sheer, LPN Outcome: Progressing 06/26/2023 0737 by Charmayne Sheer, LPN Outcome: Progressing   Problem: Education: Goal: Ability to describe self-care measures that may prevent or decrease complications (Diabetes Survival Skills Education) will improve 06/26/2023 1027 by Charmayne Sheer, LPN Outcome: Progressing 06/26/2023 0737 by Charmayne Sheer, LPN Outcome: Progressing Goal: Individualized Educational Video(s) 06/26/2023 1027 by Charmayne Sheer, LPN Outcome: Progressing 06/26/2023 0737 by Charmayne Sheer, LPN Outcome: Progressing   Problem: Cardiac: Goal: Ability to maintain an adequate cardiac output will improve 06/26/2023 1027 by Charmayne Sheer, LPN Outcome: Progressing 06/26/2023 0737 by Charmayne Sheer, LPN Outcome: Progressing   Problem: Health Behavior/Discharge Planning: Goal: Ability to identify and utilize available resources and services will improve 06/26/2023 1027 by Charmayne Sheer, LPN Outcome: Progressing 06/26/2023 0737 by Charmayne Sheer, LPN Outcome: Progressing Goal: Ability to manage health-related needs will improve 06/26/2023 1027 by Charmayne Sheer, LPN Outcome: Progressing 06/26/2023 0737 by Charmayne Sheer, LPN Outcome: Progressing   Problem: Fluid Volume: Goal: Ability to achieve a balanced intake and output will improve 06/26/2023 1027 by Charmayne Sheer, LPN Outcome: Progressing 06/26/2023 0737 by Charmayne Sheer, LPN Outcome: Progressing   Problem: Metabolic: Goal: Ability to maintain appropriate glucose levels will improve 06/26/2023 1027 by Charmayne Sheer, LPN Outcome: Progressing 06/26/2023 0737 by Charmayne Sheer, LPN Outcome: Progressing   Problem: Nutritional: Goal: Maintenance of adequate nutrition will improve 06/26/2023 1027 by Charmayne Sheer, LPN Outcome: Progressing 06/26/2023 0737 by Charmayne Sheer, LPN Outcome: Progressing Goal: Maintenance of adequate weight for body size and type will improve 06/26/2023 1027 by Charmayne Sheer, LPN Outcome: Progressing 06/26/2023 0737 by Charmayne Sheer, LPN Outcome: Progressing   Problem: Respiratory: Goal: Will regain and/or maintain adequate ventilation 06/26/2023 1027 by Charmayne Sheer, LPN Outcome: Progressing 06/26/2023 0737 by Charmayne Sheer, LPN Outcome: Progressing   Problem: Urinary Elimination: Goal: Ability to achieve and maintain adequate renal perfusion and functioning will improve 06/26/2023 1027 by Charmayne Sheer, LPN Outcome: Progressing 06/26/2023 0737 by Charmayne Sheer, LPN Outcome: Progressing   Problem: Education: Goal: Knowledge of disease or condition will improve 06/26/2023 1027 by Charmayne Sheer, LPN Outcome: Progressing 06/26/2023 0737 by Charmayne Sheer, LPN Outcome: Progressing Goal: Knowledge of secondary prevention will improve (MUST DOCUMENT ALL) 06/26/2023 1027 by Charmayne Sheer, LPN Outcome:  Progressing 06/26/2023 0737 by Charmayne Sheer, LPN Outcome: Progressing Goal: Knowledge of patient specific risk factors will improve Loraine Leriche N/A or DELETE if not current risk factor) 06/26/2023 1027 by Charmayne Sheer, LPN Outcome: Progressing 06/26/2023 0737 by Charmayne Sheer, LPN Outcome: Progressing   Problem: Ischemic Stroke/TIA Tissue Perfusion: Goal: Complications of ischemic stroke/TIA will be minimized 06/26/2023 1027 by Charmayne Sheer, LPN Outcome: Progressing 06/26/2023 0737 by Charmayne Sheer, LPN Outcome: Progressing

## 2023-06-26 NOTE — Progress Notes (Signed)
NEUROLOGY CONSULT FOLLOW UP NOTE   Date of service: June 26, 2023 Patient Name: Eric Zamora MRN:  [id number] DOB:  1946/05/30  Brief HPI  Eric Zamora is a 77 y.o. male  has a past medical history of Alzheimer disease (HCC), Bipolar affective disorder (HCC), Chronic renal insufficiency, Dementia (HCC), Dyslipidemia, HTN (hypertension), Obstructive cardiomyopathy (HCC) (Jan 2010), and Syncopal episodes. who was admitted to [facility] 10/15 due to perirectal abscess which underwent incision and drainage. He developed new onset Afib. Patient was noted to be on Keppra 500mg  twice daily for unclear reason, this was discontinued. Hospital course has been complicated by new diagnosis of diabetes, new onset atrial fibrillation 10/17, ongoing concerns for aspiration, worsening hypernatremia the last several days (attributed to lasix), transaminitis attributed to shock liver, and difficulty obtaining collateral information. Neurology was consulted 10/24 for altered mental status and weakness. His LKW was unclear and he was not a candidate for mechanical or medicinal intervention. NIH of 6 on Neuro consult exam. Head CT showed acute/subacute right MCA territory infarct. Recommended stroke work-up. MRI confirmed stroke, right frontal operculum acute infarct but also showed moderate cytotoxic edema and petechial hemorrhage.     Interval Hx/subjective   Patient is sitting up in bed, phlebotomy at bedside. Awake and interactive, following commands. Speech is very dysarthric.  Pending PT/OT eval.   Vitals   Vitals:   06/25/23 1231 06/25/23 2155 06/26/23 0148 06/26/23 0549  BP: 106/69 124/75 120/71 105/73  Pulse: (!) 106 86 77 86  Resp: 15 16 18 16   Temp: 98.1 F (36.7 C) 98.3 F (36.8 C) 97.8 F (36.6 C) 98.5 F (36.9 C)  TempSrc:  Oral Oral Oral  SpO2: 99% 99% 100% 96%  Weight:      Height:         Body mass index is 29.39 kg/m.  Physical Exam   Constitutional: Appears well-developed and  well-nourished.  Psych: Affect appropriate to situation.  Eyes: No scleral injection.  HENT: No OP obstrucion.  Head: Normocephalic.  Cardiovascular: Normal rate and regular rhythm.  Respiratory: Effort normal, non-labored breathing.  GI: Soft.  No distension. There is no tenderness.   Neurologic Examination   Neuro: Mental Status: Awake and alert, follows commands. Oriented to self, place, time. Gives limited history. Speech is very dysarthric.  Cranial Nerves: II: Visual Fields are full. Pupils are equal, round, and reactive to light.   III,IV, VI: EOMI. Control and instrumentation engineer.  V: Facial sensation is symmetric to light touch VII: Facial movement is symmetric VIII: hearing is intact to voice X: Palate elevates symmetrically XI: Shoulder shrug is symmetric. XII: tongue is midline without atrophy or fasciculations.  Motor: 5/5 BUE 4+/5 BLE  Sensory: Symmetric bilaterally to light touch  Cerebellar: Finger-to-nose intact bilaterally within limits of asterixis.  Heel-to-shin intact.  Gait:  Deferred for patient's safety  Labs and Diagnostic Imaging   CBC:  Recent Labs  Lab 06/23/23 0544 06/25/23 0559  WBC 15.6* 10.8*  HGB 14.0 13.8  HCT 42.9 44.5  MCV 89.7 93.5  PLT 364 311    Basic Metabolic Panel:  Lab Results  Component Value Date   NA 151 (H) 06/25/2023   K 3.8 06/25/2023   CO2 19 (L) 06/25/2023   GLUCOSE 104 (H) 06/25/2023   BUN 36 (H) 06/25/2023   CREATININE 1.71 (H) 06/25/2023   CALCIUM 8.6 (L) 06/25/2023   GFRNONAA 40 (L) 06/25/2023   GFRAA 50 (L) 04/12/2020   Lipid Panel:  Lab Results  Component  Value Date   LDLCALC 70 06/25/2023   HgbA1c:  Lab Results  Component Value Date   HGBA1C 9.4 (H) 06/15/2023   Urine Drug Screen:     Component Value Date/Time   LABOPIA NEGATIVE 08/04/2008 1728   COCAINSCRNUR NEGATIVE 08/04/2008 1728   LABBENZ NEGATIVE 08/04/2008 1728   AMPHETMU NEGATIVE 08/04/2008 1728    Alcohol Level No results found for:  "ETH" INR  Lab Results  Component Value Date   INR 1.2 06/15/2023   APTT  Lab Results  Component Value Date   APTT 85 (H) 06/19/2023   AED levels: No results found for: "PHENYTOIN", "ZONISAMIDE", "LAMOTRIGINE", "LEVETIRACETA"  RPR: Positive  CT Head without contrast(Personally reviewed): Acute/subacute cortical and subcortical right MCA territory infarct centered within the right frontal operculum/right insula, measuring 5.2 x 3.2 cm in transaxial dimensions. Chronic lacunar infarct within the right caudate nucleus.   MR Angio head without contrast and Carotid Duplex BL(Personally reviewed): Normal intracranial MRA  MRI Brain(Personally reviewed): Intermediate sized acute infarct of the right frontal operculum with moderate associated cytotoxic edema and petechial hemorrhage  EEG 10/25: This study is within normal limits. No seizures or epileptiform discharges were seen throughout the recording.     Impression   Eric Zamora is a 77 year old gentleman presenting with sepsis in the setting of perirectal abscess s/p incision and drainage with hospital course complicated by aspiration events, new onset A-fib with RVR, new diagnosis of diabetes, unclear history of seizures, and acute right MCA infarct. Stroke workup complete, likely cardio-embolic due to new onset of atrial fibrillation.  RPR positive. Would not recommend LP at this time due to recent acute infarct and petechial hemorrhages seen on CT.  With negative EEG and no definitive seizure history, would recommend continuing to hold off on Keppra. If patient has a seizure in the future, will need to be placed on AEDs.  Recommendations  - Repeat CT 10/31 (1 week after previous CT) to evaluate petechial hemorrhage before restarting Eliquis.  - Continue to hold Depakote  - Continue to hold Keppra  - Follow-up with outpatient neurology for stroke and for LP study    ______________________________________________________________________  Pt seen by Neuro NP/APP and later by MD. Note/plan to be edited by MD as needed.   Thank you for the opportunity to take part in the care of this patient. If you have any further questions, please contact the neurology consultation team on call. Updated oncall schedule is listed on AMION.  Signed,   Lynnae January, DNP, AGACNP-BC [facility] Please use AMION for contact information & EPIC for messaging.

## 2023-06-27 DIAGNOSIS — I4891 Unspecified atrial fibrillation: Secondary | ICD-10-CM | POA: Diagnosis not present

## 2023-06-27 DIAGNOSIS — A419 Sepsis, unspecified organism: Secondary | ICD-10-CM | POA: Diagnosis not present

## 2023-06-27 DIAGNOSIS — R4182 Altered mental status, unspecified: Secondary | ICD-10-CM | POA: Diagnosis not present

## 2023-06-27 DIAGNOSIS — E1110 Type 2 diabetes mellitus with ketoacidosis without coma: Secondary | ICD-10-CM | POA: Diagnosis not present

## 2023-06-27 LAB — BASIC METABOLIC PANEL
Anion gap: 8 (ref 5–15)
BUN: 28 mg/dL — ABNORMAL HIGH (ref 8–23)
CO2: 22 mmol/L (ref 22–32)
Calcium: 8.4 mg/dL — ABNORMAL LOW (ref 8.9–10.3)
Chloride: 121 mmol/L — ABNORMAL HIGH (ref 98–111)
Creatinine, Ser: 1.41 mg/dL — ABNORMAL HIGH (ref 0.61–1.24)
GFR, Estimated: 51 mL/min — ABNORMAL LOW (ref >60)
Glucose, Bld: 90 mg/dL (ref 70–99)
Potassium: 3.4 mmol/L — ABNORMAL LOW (ref 3.5–5.1)
Sodium: 151 mmol/L — ABNORMAL HIGH (ref 135–145)

## 2023-06-27 LAB — GLUCOSE, CAPILLARY
Glucose-Capillary: 86 mg/dL (ref 70–99)
Glucose-Capillary: 210 mg/dL — ABNORMAL HIGH (ref 70–99)
Glucose-Capillary: 322 mg/dL — ABNORMAL HIGH (ref 70–99)
Glucose-Capillary: 265 mg/dL — ABNORMAL HIGH (ref 70–99)
Glucose-Capillary: 166 mg/dL — ABNORMAL HIGH (ref 70–99)
Glucose-Capillary: 115 mg/dL — ABNORMAL HIGH (ref 70–99)

## 2023-06-27 MED ORDER — DEXTROSE-SODIUM CHLORIDE 5-0.45 % IV SOLN: INTRAVENOUS | Status: DC

## 2023-06-27 MED ORDER — LOPERAMIDE HCL 2 MG PO CAPS
2.0000 mg | ORAL_CAPSULE | Freq: Once | ORAL | Status: DC
  Filled 2023-06-27: qty 1

## 2023-06-27 MED ORDER — METOPROLOL TARTRATE 50 MG PO TABS
50.0000 mg | ORAL_TABLET | Freq: Two times a day (BID) | ORAL | Status: DC
  Administered 2023-06-27 – 2023-07-02 (×10): 50 mg via ORAL
  Filled 2023-06-27 (×11): qty 1

## 2023-06-27 MED ORDER — LACTATED RINGERS IV BOLUS
500.0000 mL | Freq: Once | INTRAVENOUS | Status: AC
  Administered 2023-06-27: 500 mL via INTRAVENOUS

## 2023-06-27 NOTE — Progress Notes (Signed)
of seizures. EEG to evaluate for seizure. Level of alertness: Awake AEDs during EEG study: None Technical aspects: This EEG study was done with scalp electrodes positioned according to the 10-20 International system of electrode placement. Electrical activity was reviewed with band pass filter of 1-70Hz , sensitivity of 7 uV/mm, display speed of 17mm/sec with a 60Hz  notched filter applied as appropriate. EEG data were recorded continuously and digitally stored.  Video monitoring was available and reviewed as appropriate. Description: The posterior dominant rhythm consists of 8 Hz activity of moderate voltage (25-35 uV) seen predominantly in posterior head regions, symmetric and reactive to eye opening and eye closing. Physiologic photic driving was not seen during photic stimulation.  Hyperventilation was not performed.   IMPRESSION: This study is within normal limits. No seizures or epileptiform discharges were seen throughout the  recording. A normal interictal EEG does not exclude the diagnosis of epilepsy. Eric Zamora   VAS US CAROTID  Result Date: 06/25/2023 Carotid Arterial Duplex Study Patient Name:  Eric Zamora  Date of Exam:   06/25/2023 Medical Rec #: [id number]         Accession #:    [id number] Date of Birth: [birth year]         Patient Gender: M Patient Age:   77 years Exam Location:  [facility] Procedure:      VAS US CAROTID Referring Phys: Brooke Dare --------------------------------------------------------------------------------  Indications:       CVA. Risk Factors:      Hypertension, hyperlipidemia, Diabetes. Limitations        Today's exam was limited due to the patient's respiratory                    variation and patient positioning, patient movement. Comparison Study:  No prior studies. Performing Technologist: Chanda Busing RVT  Examination Guidelines: A complete evaluation includes B-mode imaging, spectral Doppler, color Doppler, and power Doppler as needed of all accessible portions of each vessel. Bilateral testing is considered an integral part of a complete examination. Limited examinations for reoccurring indications may be performed as noted.  Right Carotid Findings: +----------+--------+--------+--------+-----------------------+--------+           PSV cm/sEDV cm/sStenosisPlaque Description     Comments +----------+--------+--------+--------+-----------------------+--------+ CCA Prox  90      15              smooth and heterogenous         +----------+--------+--------+--------+-----------------------+--------+ CCA Distal65      12              smooth and heterogenous         +----------+--------+--------+--------+-----------------------+--------+ ICA Prox  65      22              smooth and heterogenous         +----------+--------+--------+--------+-----------------------+--------+ ICA Mid   93      29                                     tortuous  +----------+--------+--------+--------+-----------------------+--------+ ICA Distal89      26                                     tortuous +----------+--------+--------+--------+-----------------------+--------+ ECA       75      7                                               +----------+--------+--------+--------+-----------------------+--------+ +----------+--------+-------+--------+-------------------+  of seizures. EEG to evaluate for seizure. Level of alertness: Awake AEDs during EEG study: None Technical aspects: This EEG study was done with scalp electrodes positioned according to the 10-20 International system of electrode placement. Electrical activity was reviewed with band pass filter of 1-70Hz , sensitivity of 7 uV/mm, display speed of 17mm/sec with a 60Hz  notched filter applied as appropriate. EEG data were recorded continuously and digitally stored.  Video monitoring was available and reviewed as appropriate. Description: The posterior dominant rhythm consists of 8 Hz activity of moderate voltage (25-35 uV) seen predominantly in posterior head regions, symmetric and reactive to eye opening and eye closing. Physiologic photic driving was not seen during photic stimulation.  Hyperventilation was not performed.   IMPRESSION: This study is within normal limits. No seizures or epileptiform discharges were seen throughout the  recording. A normal interictal EEG does not exclude the diagnosis of epilepsy. Eric Zamora   VAS US CAROTID  Result Date: 06/25/2023 Carotid Arterial Duplex Study Patient Name:  Eric Zamora  Date of Exam:   06/25/2023 Medical Rec #: [id number]         Accession #:    [id number] Date of Birth: [birth year]         Patient Gender: M Patient Age:   77 years Exam Location:  [facility] Procedure:      VAS US CAROTID Referring Phys: Brooke Dare --------------------------------------------------------------------------------  Indications:       CVA. Risk Factors:      Hypertension, hyperlipidemia, Diabetes. Limitations        Today's exam was limited due to the patient's respiratory                    variation and patient positioning, patient movement. Comparison Study:  No prior studies. Performing Technologist: Chanda Busing RVT  Examination Guidelines: A complete evaluation includes B-mode imaging, spectral Doppler, color Doppler, and power Doppler as needed of all accessible portions of each vessel. Bilateral testing is considered an integral part of a complete examination. Limited examinations for reoccurring indications may be performed as noted.  Right Carotid Findings: +----------+--------+--------+--------+-----------------------+--------+           PSV cm/sEDV cm/sStenosisPlaque Description     Comments +----------+--------+--------+--------+-----------------------+--------+ CCA Prox  90      15              smooth and heterogenous         +----------+--------+--------+--------+-----------------------+--------+ CCA Distal65      12              smooth and heterogenous         +----------+--------+--------+--------+-----------------------+--------+ ICA Prox  65      22              smooth and heterogenous         +----------+--------+--------+--------+-----------------------+--------+ ICA Mid   93      29                                     tortuous  +----------+--------+--------+--------+-----------------------+--------+ ICA Distal89      26                                     tortuous +----------+--------+--------+--------+-----------------------+--------+ ECA       75      7                                               +----------+--------+--------+--------+-----------------------+--------+ +----------+--------+-------+--------+-------------------+  [facility]                                                                              [employer], is a 77 y.o. male, DOB - Oct 16, 1945, ZOX:[id number] Admit date - 06/15/2023    Outpatient Primary MD for the patient is Kurth-Bowen, Cornelia, PA-C  LOS - 12  days  Chief Complaint  Patient presents with   Altered Mental Status       Brief summary   Mr. Pinkhasov is a 77 y.o. 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  Significant events: 10/15: Sent from SNF for tachpynea, found to have fever, no localizing source, admitted on antibiotics 10/17: Overnight with fever, aspiration event, new AG acidosis, transferred back to SDU for BiPAP and insulin drip 10/18: CT shows perirectal abscess, general surgery consulted; taken to OR for drainage of abscess 10/19: SLP advanced to Dys 1 diet, honey thick 10/20 to 10/22: Titrating up metoprolol for atrial fibrillation  Assessment & Plan    Principal Problem:   Severe sepsis due to perirectal abscess(HCC) -Presented with fever, leukocytosis, AKI, reported encephalopathy and hypotension.  UTI, pneumonia ruled out. -CT chest abdomen pelvis showed perirectal abscess  -General Surgery was consulted, status post I&D by Dr. Derrell Lolling on 06/18/23 -Continue Augmentin, doxycycline for 14 days - Penrose drain removed, surgery recommended to continue local wound care, sitz bath   Active problems Atrial fibrillation with RVR, new onset -New onset.  CHA2DS2-Vasc 6 (age, HTN, CHF, DM, vascular disease).  TSH normal.  Echo shows HOCM (previously known).  Normal valves. -Patient was placed on apixaban and Plavix was discontinued.  Eliquis currently on hold per neurology recommendations. -Cardiology was following, patient was placed on Lopressor 100 mg 3 times daily due to  difficulty in rate control. Outpatient cardioversion in 3 weeks if does not convert to NSR on his own. -TSH normal -However BP now soft, in 70's, will give LR bolus and reduce metoprolol to 50 mg twice daily.  If difficult to control HR, hypotension, may need amiodarone.  Acute metabolic encephalopathy superimposed on dementia, right MCA embolic appearing stroke -At baseline, patient is ambulatory without assistance, participates in cares, and is interactive, but has severe short term memory loss.  -Keppra was held placed on standard delirium precautions -Given lethargy on 10/24, CT head was done which showed acute/subacute cortical and subcortical right MCA territory infarct centered within the right frontal operculum/right insula,measuring 5.2 x 3.2 cm, petechial hemorrhage.  -Neurology was consulted, recommended holding Eliquis for now due to consideration for needing LP to rule out neurosyphilis -MRI brain showed intermediate-sized acute infarct of the right frontal operculum with moderate associated cytotoxic edema and petechial hemorrhage - normal intracranial MRA -PT OT, SLP following.  LDL 70 at goal.  Hemoglobin A1c 9 4 on 10/15 -2D echo 10/16 showed HOCM (previously known), RV SF normal -HIV negative, follow RPR -Per neurology, repeat CT 1 week from initial scan, if stable, then resume apixaban.  of seizures. EEG to evaluate for seizure. Level of alertness: Awake AEDs during EEG study: None Technical aspects: This EEG study was done with scalp electrodes positioned according to the 10-20 International system of electrode placement. Electrical activity was reviewed with band pass filter of 1-70Hz , sensitivity of 7 uV/mm, display speed of 17mm/sec with a 60Hz  notched filter applied as appropriate. EEG data were recorded continuously and digitally stored.  Video monitoring was available and reviewed as appropriate. Description: The posterior dominant rhythm consists of 8 Hz activity of moderate voltage (25-35 uV) seen predominantly in posterior head regions, symmetric and reactive to eye opening and eye closing. Physiologic photic driving was not seen during photic stimulation.  Hyperventilation was not performed.   IMPRESSION: This study is within normal limits. No seizures or epileptiform discharges were seen throughout the  recording. A normal interictal EEG does not exclude the diagnosis of epilepsy. Eric Zamora   VAS US CAROTID  Result Date: 06/25/2023 Carotid Arterial Duplex Study Patient Name:  Eric Zamora  Date of Exam:   06/25/2023 Medical Rec #: [id number]         Accession #:    [id number] Date of Birth: [birth year]         Patient Gender: M Patient Age:   77 years Exam Location:  [facility] Procedure:      VAS US CAROTID Referring Phys: Brooke Dare --------------------------------------------------------------------------------  Indications:       CVA. Risk Factors:      Hypertension, hyperlipidemia, Diabetes. Limitations        Today's exam was limited due to the patient's respiratory                    variation and patient positioning, patient movement. Comparison Study:  No prior studies. Performing Technologist: Chanda Busing RVT  Examination Guidelines: A complete evaluation includes B-mode imaging, spectral Doppler, color Doppler, and power Doppler as needed of all accessible portions of each vessel. Bilateral testing is considered an integral part of a complete examination. Limited examinations for reoccurring indications may be performed as noted.  Right Carotid Findings: +----------+--------+--------+--------+-----------------------+--------+           PSV cm/sEDV cm/sStenosisPlaque Description     Comments +----------+--------+--------+--------+-----------------------+--------+ CCA Prox  90      15              smooth and heterogenous         +----------+--------+--------+--------+-----------------------+--------+ CCA Distal65      12              smooth and heterogenous         +----------+--------+--------+--------+-----------------------+--------+ ICA Prox  65      22              smooth and heterogenous         +----------+--------+--------+--------+-----------------------+--------+ ICA Mid   93      29                                     tortuous  +----------+--------+--------+--------+-----------------------+--------+ ICA Distal89      26                                     tortuous +----------+--------+--------+--------+-----------------------+--------+ ECA       75      7                                               +----------+--------+--------+--------+-----------------------+--------+ +----------+--------+-------+--------+-------------------+  [facility]                                                                              [employer], is a 77 y.o. male, DOB - Oct 16, 1945, ZOX:[id number] Admit date - 06/15/2023    Outpatient Primary MD for the patient is Kurth-Bowen, Cornelia, PA-C  LOS - 12  days  Chief Complaint  Patient presents with   Altered Mental Status       Brief summary   Mr. Pinkhasov is a 77 y.o. 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  Significant events: 10/15: Sent from SNF for tachpynea, found to have fever, no localizing source, admitted on antibiotics 10/17: Overnight with fever, aspiration event, new AG acidosis, transferred back to SDU for BiPAP and insulin drip 10/18: CT shows perirectal abscess, general surgery consulted; taken to OR for drainage of abscess 10/19: SLP advanced to Dys 1 diet, honey thick 10/20 to 10/22: Titrating up metoprolol for atrial fibrillation  Assessment & Plan    Principal Problem:   Severe sepsis due to perirectal abscess(HCC) -Presented with fever, leukocytosis, AKI, reported encephalopathy and hypotension.  UTI, pneumonia ruled out. -CT chest abdomen pelvis showed perirectal abscess  -General Surgery was consulted, status post I&D by Dr. Derrell Lolling on 06/18/23 -Continue Augmentin, doxycycline for 14 days - Penrose drain removed, surgery recommended to continue local wound care, sitz bath   Active problems Atrial fibrillation with RVR, new onset -New onset.  CHA2DS2-Vasc 6 (age, HTN, CHF, DM, vascular disease).  TSH normal.  Echo shows HOCM (previously known).  Normal valves. -Patient was placed on apixaban and Plavix was discontinued.  Eliquis currently on hold per neurology recommendations. -Cardiology was following, patient was placed on Lopressor 100 mg 3 times daily due to  difficulty in rate control. Outpatient cardioversion in 3 weeks if does not convert to NSR on his own. -TSH normal -However BP now soft, in 70's, will give LR bolus and reduce metoprolol to 50 mg twice daily.  If difficult to control HR, hypotension, may need amiodarone.  Acute metabolic encephalopathy superimposed on dementia, right MCA embolic appearing stroke -At baseline, patient is ambulatory without assistance, participates in cares, and is interactive, but has severe short term memory loss.  -Keppra was held placed on standard delirium precautions -Given lethargy on 10/24, CT head was done which showed acute/subacute cortical and subcortical right MCA territory infarct centered within the right frontal operculum/right insula,measuring 5.2 x 3.2 cm, petechial hemorrhage.  -Neurology was consulted, recommended holding Eliquis for now due to consideration for needing LP to rule out neurosyphilis -MRI brain showed intermediate-sized acute infarct of the right frontal operculum with moderate associated cytotoxic edema and petechial hemorrhage - normal intracranial MRA -PT OT, SLP following.  LDL 70 at goal.  Hemoglobin A1c 9 4 on 10/15 -2D echo 10/16 showed HOCM (previously known), RV SF normal -HIV negative, follow RPR -Per neurology, repeat CT 1 week from initial scan, if stable, then resume apixaban.  of seizures. EEG to evaluate for seizure. Level of alertness: Awake AEDs during EEG study: None Technical aspects: This EEG study was done with scalp electrodes positioned according to the 10-20 International system of electrode placement. Electrical activity was reviewed with band pass filter of 1-70Hz , sensitivity of 7 uV/mm, display speed of 17mm/sec with a 60Hz  notched filter applied as appropriate. EEG data were recorded continuously and digitally stored.  Video monitoring was available and reviewed as appropriate. Description: The posterior dominant rhythm consists of 8 Hz activity of moderate voltage (25-35 uV) seen predominantly in posterior head regions, symmetric and reactive to eye opening and eye closing. Physiologic photic driving was not seen during photic stimulation.  Hyperventilation was not performed.   IMPRESSION: This study is within normal limits. No seizures or epileptiform discharges were seen throughout the  recording. A normal interictal EEG does not exclude the diagnosis of epilepsy. Eric Zamora   VAS US CAROTID  Result Date: 06/25/2023 Carotid Arterial Duplex Study Patient Name:  Eric Zamora  Date of Exam:   06/25/2023 Medical Rec #: [id number]         Accession #:    [id number] Date of Birth: [birth year]         Patient Gender: M Patient Age:   77 years Exam Location:  [facility] Procedure:      VAS US CAROTID Referring Phys: Brooke Dare --------------------------------------------------------------------------------  Indications:       CVA. Risk Factors:      Hypertension, hyperlipidemia, Diabetes. Limitations        Today's exam was limited due to the patient's respiratory                    variation and patient positioning, patient movement. Comparison Study:  No prior studies. Performing Technologist: Chanda Busing RVT  Examination Guidelines: A complete evaluation includes B-mode imaging, spectral Doppler, color Doppler, and power Doppler as needed of all accessible portions of each vessel. Bilateral testing is considered an integral part of a complete examination. Limited examinations for reoccurring indications may be performed as noted.  Right Carotid Findings: +----------+--------+--------+--------+-----------------------+--------+           PSV cm/sEDV cm/sStenosisPlaque Description     Comments +----------+--------+--------+--------+-----------------------+--------+ CCA Prox  90      15              smooth and heterogenous         +----------+--------+--------+--------+-----------------------+--------+ CCA Distal65      12              smooth and heterogenous         +----------+--------+--------+--------+-----------------------+--------+ ICA Prox  65      22              smooth and heterogenous         +----------+--------+--------+--------+-----------------------+--------+ ICA Mid   93      29                                     tortuous  +----------+--------+--------+--------+-----------------------+--------+ ICA Distal89      26                                     tortuous +----------+--------+--------+--------+-----------------------+--------+ ECA       75      7                                               +----------+--------+--------+--------+-----------------------+--------+ +----------+--------+-------+--------+-------------------+

## 2023-06-27 NOTE — Progress Notes (Incomplete)
Speech Language Pathology Treatment: Dysphagia  Patient Details Name: Eric Zamora MRN: [id number] DOB: 08-08-1946 Today's Date: 06/27/2023 Time: 4403-4742 SLP Time Calculation (min) (ACUTE ONLY): 21 min  Assessment / Plan / Recommendation Clinical Impression  Patient seen to address dysphagia goals - Pt awake in bed, polite and willing to consume breakfast - - SLP obtained food   HPI HPI: 77 yo male adm with AMS, foul urine odor and dyspnea.  Pt is a resident of [address].  He experienced an aspiration episode yesterday with medications with applesauce and then experienced fever and respiratory difficulties requiring Bipap. Pt has h/o falls, dementia, Afib, CHF. CXR  06/17/2023 Patchy opacities throughout the lungs bilaterally, predominantly dependent, which are concerning for aspiration pneumonia/pneumonitis given the clinical concern for aspiration. 2. Small bilateral pleural effusions (right greater than left). 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 MBS completed on 10/19. Patient on dys 1 (puree) and honey thick liquids.      SLP Plan  Continue with current plan of care      Recommendations for follow up therapy are one component of a multi-disciplinary discharge planning process, led by the attending physician.  Recommendations may be updated based on patient status, additional functional criteria and insurance authorization.    Recommendations  Diet recommendations: Thin liquid;Nectar-thick liquid;Dysphagia 1 (puree) Liquids provided via: Cup;Straw Medication Administration: Whole meds with puree Supervision: Patient able to self feed Compensations: Slow rate;Small sips/bites;Other (Comment) (start intake with liquids) Postural Changes and/or Swallow Maneuvers: Seated upright 90 degrees;Upright 30-60 min after meal                  Oral care BID   None Dysphagia, oropharyngeal phase (R13.12)     Continue with  current plan of care     Eric Zamora  06/27/2023, 12:15 PM

## 2023-06-27 NOTE — Plan of Care (Signed)
No acute events overnight. Problem: Education: Goal: Ability to describe self-care measures that may prevent or decrease complications (Diabetes Survival Skills Education) will improve Outcome: Progressing Goal: Individualized Educational Video(s) Outcome: Progressing   Problem: Coping: Goal: Ability to adjust to condition or change in health will improve Outcome: Progressing   Problem: Fluid Volume: Goal: Ability to maintain a balanced intake and output will improve Outcome: Progressing   Problem: Health Behavior/Discharge Planning: Goal: Ability to identify and utilize available resources and services will improve Outcome: Progressing Goal: Ability to manage health-related needs will improve Outcome: Progressing   Problem: Metabolic: Goal: Ability to maintain appropriate glucose levels will improve Outcome: Progressing   Problem: Nutritional: Goal: Maintenance of adequate nutrition will improve Outcome: Progressing Goal: Progress toward achieving an optimal weight will improve Outcome: Progressing   Problem: Skin Integrity: Goal: Risk for impaired skin integrity will decrease Outcome: Progressing   Problem: Tissue Perfusion: Goal: Adequacy of tissue perfusion will improve Outcome: Progressing   Problem: Education: Goal: Knowledge of General Education information will improve Description: Including pain rating scale, medication(s)/side effects and non-pharmacologic comfort measures Outcome: Progressing   Problem: Health Behavior/Discharge Planning: Goal: Ability to manage health-related needs will improve Outcome: Progressing   Problem: Clinical Measurements: Goal: Ability to maintain clinical measurements within normal limits will improve Outcome: Progressing Goal: Will remain free from infection Outcome: Progressing Goal: Diagnostic test results will improve Outcome: Progressing Goal: Respiratory complications will improve Outcome: Progressing Goal:  Cardiovascular complication will be avoided Outcome: Progressing   Problem: Activity: Goal: Risk for activity intolerance will decrease Outcome: Progressing   Problem: Nutrition: Goal: Adequate nutrition will be maintained Outcome: Progressing   Problem: Coping: Goal: Level of anxiety will decrease Outcome: Progressing   Problem: Elimination: Goal: Will not experience complications related to bowel motility Outcome: Progressing Goal: Will not experience complications related to urinary retention Outcome: Progressing   Problem: Pain Managment: Goal: General experience of comfort will improve Outcome: Progressing   Problem: Safety: Goal: Ability to remain free from injury will improve Outcome: Progressing   Problem: Skin Integrity: Goal: Risk for impaired skin integrity will decrease Outcome: Progressing   Problem: Fluid Volume: Goal: Hemodynamic stability will improve Outcome: Progressing   Problem: Clinical Measurements: Goal: Diagnostic test results will improve Outcome: Progressing Goal: Signs and symptoms of infection will decrease Outcome: Progressing   Problem: Respiratory: Goal: Ability to maintain adequate ventilation will improve Outcome: Progressing   Problem: Education: Goal: Ability to describe self-care measures that may prevent or decrease complications (Diabetes Survival Skills Education) will improve Outcome: Progressing Goal: Individualized Educational Video(s) Outcome: Progressing   Problem: Cardiac: Goal: Ability to maintain an adequate cardiac output will improve Outcome: Progressing   Problem: Health Behavior/Discharge Planning: Goal: Ability to identify and utilize available resources and services will improve Outcome: Progressing Goal: Ability to manage health-related needs will improve Outcome: Progressing   Problem: Fluid Volume: Goal: Ability to achieve a balanced intake and output will improve Outcome: Progressing   Problem:  Metabolic: Goal: Ability to maintain appropriate glucose levels will improve Outcome: Progressing   Problem: Nutritional: Goal: Maintenance of adequate nutrition will improve Outcome: Progressing Goal: Maintenance of adequate weight for body size and type will improve Outcome: Progressing   Problem: Respiratory: Goal: Will regain and/or maintain adequate ventilation Outcome: Progressing   Problem: Urinary Elimination: Goal: Ability to achieve and maintain adequate renal perfusion and functioning will improve Outcome: Progressing   Problem: Education: Goal: Knowledge of disease or condition will improve Outcome: Progressing Goal: Knowledge of  secondary prevention will improve (MUST DOCUMENT ALL) Outcome: Progressing Goal: Knowledge of patient specific risk factors will improve Loraine Leriche N/A or DELETE if not current risk factor) Outcome: Progressing

## 2023-06-27 NOTE — Progress Notes (Signed)
[facility] Progress Note    Assessment/ Plan:   Acute kidney injury on CKD stage IIIb likely hemodynamically mediated due recent use of diuretics/ intravascular volume depletion and sepsis.  Recent CT scan ruled out hydronephrosis. AKI resolved -Baseline Cr around 1.5. Cr improved-down to 1.4 today-at baseline now -c/w hypotonic fluids especially in light of hypernatremia -Avoid nephrotoxic medications including NSAIDs and iodinated intravenous contrast exposure unless the latter is absolutely indicated.  Preferred narcotic agents for pain control are hydromorphone, fentanyl, and methadone. Morphine should not be used. Avoid Baclofen and avoid oral sodium phosphate and magnesium citrate based laxatives / bowel preps. Continue strict Input and Output monitoring. Will monitor the patient closely with you and intervene or adjust therapy as indicated by changes in clinical status/labs    Hypernatremia, hypovolemic: Hypernatremia worsening because of Lasix use.  He is off of Lasix.   -Na unchanged 151. Hypotonic fluids: c/w d5 1/2ns @ 125cc/hr. Holding off on hypotonic bolus, is going to be receiving LR bolus now -free water deficit today is around 3.1L. If Na is not any better tomorrow then would recommend hypotonic bolus provided volume status is not worsening   Acute stroke/acute metabolic encephalopathy H/o dementia -per primary   Severe sepsis due to perirectal abscess status post I&D  -Currently on Augmentin and doxycycline.surgery following   A-fib with RVR: Seen by cardiologist.  On metoprolol and eliquis.  Monitor BP and heart rate.  HTN: now on the hypotensive side. W/u and mgmt per primary service  Uncontrolled DM2 -per primary service  Subjective:   Patient seen and examined bedside. Discussed with RN at the bedside. Was hypotensive down to the 70's systolic, up to 90's. 500cc LR bolus ordered. LUE IV access infiltrated therefore left arm is swollen. No other  acute events No complaints Is eating adequately, not sure about water intake.   Objective:   BP (!) 99/52 (BP Location: Right Arm)   Pulse 91   Temp 98.4 F (36.9 C) (Oral)   Resp 18   Ht 5\' 5"  (1.651 m)   Wt 80.1 kg   SpO2 100%   BMI 29.39 kg/m   Intake/Output Summary (Last 24 hours) at 06/27/2023 1100 Last data filed at 06/27/2023 1610 Gross per 24 hour  Intake 2029.89 ml  Output --  Net 2029.89 ml   Weight change:   Physical Exam: Gen: ill appearing, NAD CVS: RRR Resp: CTA B/L Abd: soft Ext: LUE swelling Neuro: awake, following commands, dysarthric  Imaging: EEG adult  Result Date: 06/25/2023 Charlsie Quest, MD     06/25/2023  5:20 PM Patient Name: Eric [facility]ora MRN: [id number] Epilepsy Attending: Charlsie Quest Referring Physician/Provider: Gordy Councilman, MD Date: 06/25/2023 Duration: 24.42 mins Patient history:  77 year old gentleman presenting with sepsis in the setting of perirectal abscess s/p incision and drainage with hospital course complicated by aspiration events, new onset A-fib with RVR, new diagnosis of diabetes, unclear history of seizures. EEG to evaluate for seizure. Level of alertness: Awake AEDs during EEG study: None Technical aspects: This EEG study was done with scalp electrodes positioned according to the 10-20 International system of electrode placement. Electrical activity was reviewed with band pass filter of 1-70Hz , sensitivity of 7 uV/mm, display speed of 67mm/sec with a 60Hz  notched filter applied as appropriate. EEG data were recorded continuously and digitally stored.  Video monitoring was available and reviewed as appropriate. Description: The posterior dominant rhythm consists of 8 Hz activity of moderate voltage (25-35 uV) seen  predominantly in posterior head regions, symmetric and reactive to eye opening and eye closing. Physiologic photic driving was not seen during photic stimulation.  Hyperventilation was not performed.    IMPRESSION: This study is within normal limits. No seizures or epileptiform discharges were seen throughout the recording. A normal interictal EEG does not exclude the diagnosis of epilepsy. [name]   VAS US CAROTID  Result Date: 06/25/2023 Carotid Arterial Duplex Study Patient Name:  Eric Zamora  Date of Exam:   06/25/2023 Medical Rec #: [id number]         Accession #:    [id number] Date of Birth: 08/16/1946         Patient Gender: M Patient Age:   37 years Exam Location:  [facility] Procedure:      VAS US CAROTID Referring Phys: Brooke Dare --------------------------------------------------------------------------------  Indications:       CVA. Risk Factors:      Hypertension, hyperlipidemia, Diabetes. Limitations        Today's exam was limited due to the patient's respiratory                    variation and patient positioning, patient movement. Comparison Study:  No prior studies. Performing Technologist: Chanda Busing RVT  Examination Guidelines: A complete evaluation includes B-mode imaging, spectral Doppler, color Doppler, and power Doppler as needed of all accessible portions of each vessel. Bilateral testing is considered an integral part of a complete examination. Limited examinations for reoccurring indications may be performed as noted.  Right Carotid Findings: +----------+--------+--------+--------+-----------------------+--------+           PSV cm/sEDV cm/sStenosisPlaque Description     Comments +----------+--------+--------+--------+-----------------------+--------+ CCA Prox  90      15              smooth and heterogenous         +----------+--------+--------+--------+-----------------------+--------+ CCA Distal65      12              smooth and heterogenous         +----------+--------+--------+--------+-----------------------+--------+ ICA Prox  65      22              smooth and heterogenous          +----------+--------+--------+--------+-----------------------+--------+ ICA Mid   93      29                                     tortuous +----------+--------+--------+--------+-----------------------+--------+ ICA Distal89      26                                     tortuous +----------+--------+--------+--------+-----------------------+--------+ ECA       75      7                                               +----------+--------+--------+--------+-----------------------+--------+ +----------+--------+-------+--------+-------------------+           PSV cm/sEDV cmsDescribeArm Pressure (mmHG) +----------+--------+-------+--------+-------------------+ [id number]                                         +----------+--------+-------+--------+-------------------+ +---------+--------+--+--------+-+---------+  VertebralPSV cm/s41EDV cm/s3Antegrade +---------+--------+--+--------+-+---------+  Left Carotid Findings: +----------+--------+--------+--------+-----------------------+--------+           PSV cm/sEDV cm/sStenosisPlaque Description     Comments +----------+--------+--------+--------+-----------------------+--------+ CCA Prox  83      18              smooth and heterogenous         +----------+--------+--------+--------+-----------------------+--------+ CCA Distal80      17              smooth and heterogenous         +----------+--------+--------+--------+-----------------------+--------+ ICA Prox  55      18              smooth and heterogenous         +----------+--------+--------+--------+-----------------------+--------+ ICA Mid   78      24                                     tortuous +----------+--------+--------+--------+-----------------------+--------+ ICA Distal51      20                                     tortuous +----------+--------+--------+--------+-----------------------+--------+ ECA       58      7                                                +----------+--------+--------+--------+-----------------------+--------+ +----------+--------+--------+--------+-------------------+           PSV cm/sEDV cm/sDescribeArm Pressure (mmHG) +----------+--------+--------+--------+-------------------+ [id number]                                         +----------+--------+--------+--------+-------------------+ +---------+--------+--+--------+-+---------+ VertebralPSV cm/s31EDV cm/s6Antegrade +---------+--------+--+--------+-+---------+   Summary: Right Carotid: Velocities in the right ICA are consistent with a 1-39% stenosis. Left Carotid: Velocities in the left ICA are consistent with a 1-39% stenosis. Vertebrals: Bilateral vertebral arteries demonstrate antegrade flow. *See table(s) above for measurements and observations.  Electronically signed by Delia Heady MD on 06/25/2023 at 4:49:43 PM.    Final     Labs: BMET Recent Labs  Lab 06/21/23 0544 06/22/23 1191 06/23/23 0544 06/24/23 1245 06/25/23 0559 06/26/23 0906 06/27/23 0724  NA 144 149* 151* 152* 151* 151* 151*  K 3.9 3.5 3.8 3.8 3.8 3.9 3.4*  CL 116* 118* 123* 123* 123* 123* 121*  CO2 17* 22 20* 20* 19* 20* 22  GLUCOSE 115* 146* 162* 217* 104* 159* 90  BUN 22 35* 41* 42* 36* 33* 28*  CREATININE 1.50* 1.84* 1.99* 2.06* 1.71* 1.58* 1.41*  CALCIUM 7.8* 8.3* 8.7* 8.6* 8.6* 8.6* 8.4*   CBC Recent Labs  Lab 06/22/23 0613 06/23/23 0544 06/25/23 0559 06/26/23 0814  WBC 15.6* 15.6* 10.8* 9.2  HGB 13.1 14.0 13.8 15.0  HCT 40.4 42.9 44.5 49.4  MCV 87.4 89.7 93.5 93.9  PLT 369 364 311 298    Medications:     amoxicillin-clavulanate  1 tablet Oral Q12H   doxycycline  100 mg Oral Q12H   feeding supplement  237 mL Oral BID BM   insulin aspart  0-15 Units Subcutaneous TID WC   insulin aspart  0-5 Units Subcutaneous QHS   insulin aspart  3 Units Subcutaneous TID WC   insulin glargine-yfgn  22 Units Subcutaneous Q24H    loperamide  2 mg Oral Once   metoprolol tartrate  100 mg Oral TID   nystatin  5 mL Oral QID   mouth rinse  15 mL Mouth Rinse 4 times per day   penicillin g benzathine (BICILLIN-LA) IM  2.4 Million Units Intramuscular Once   sodium chloride flush  3 mL Intravenous Q12H      Anthony Sar, MD [facility] 06/27/2023, 11:00 AM

## 2023-06-27 NOTE — Progress Notes (Signed)
Speech Language Pathology Treatment: Dysphagia  Patient Details Name: Eric [facility] MRN: [id number] DOB: Sep 03, 1945 Today's Date: 06/27/2023 Time: 8295-6213 SLP Time Calculation (min) (ACUTE ONLY): 21 min  Assessment / Plan / Recommendation Clinical Impression  Patient seen to address dysphagia goals, With assist he was set upright and fed himself his breakfast.  Advanced textures trialed including thin water via straw.  Pt audibly aspirated on MBS - and produced strong cough x1/3 thin water boluses.  He then advised, "that's enough" admitting to discomfort with water aspiration.  However tsps of thin water were tolerated well without clinical indication of aspiration.  Pt benefited from minimal cues to take small sips - given his dementia - he likely will be unable to generalize and intermittent supervision advised.    Pt self fed his breakfast which included purees foods :  waffles, cream of wheat, raspberries, nectar thickened orange juice and milk.  He fed himself via straw which was more efficient for feeding.  He remains severely dysarthric and at this time, recommend continue puree/nectar and allow tsps of thin liquids any time. Will provide Provale cup during hospital coarse to allow self feeding with thin to aid with hydration and comfort.  Updated orders and signs.    HPI HPI: 77 yo male adm with AMS, foul urine odor and dyspnea.  Pt is a resident of [address].  He experienced an aspiration episode yesterday with medications with applesauce and then experienced fever and respiratory difficulties requiring Bipap. Pt has h/o falls, dementia, Afib, CHF. CXR  06/17/2023 Patchy opacities throughout the lungs bilaterally, predominantly dependent, which are concerning for aspiration pneumonia/pneumonitis given the clinical concern for aspiration. 2. Small bilateral pleural effusions (right greater than left). 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  MBS completed on 10/19. Patient on dys 1 (puree) and honey thick liquids.      SLP Plan  Continue with current plan of care      Recommendations for follow up therapy are one component of a multi-disciplinary discharge planning process, led by the attending physician.  Recommendations may be updated based on patient status, additional functional criteria and insurance authorization.    Recommendations  Diet recommendations: Thin liquid;Nectar-thick liquid;Dysphagia 1 (puree) Liquids provided via: Cup;Straw Medication Administration: Whole meds with puree Supervision: Patient able to self feed Compensations: Slow rate;Small sips/bites;Other (Comment) (start intake with liquids) Postural Changes and/or Swallow Maneuvers: Seated upright 90 degrees;Upright 30-60 min after meal                  Oral care BID   None Dysphagia, oropharyngeal phase (R13.12)     Continue with current plan of care   [name], MS [facility] SLP Acute Rehab Services Office [phone]   [name]  06/27/2023, 12:49 PM

## 2023-06-28 DIAGNOSIS — A419 Sepsis, unspecified organism: Secondary | ICD-10-CM | POA: Diagnosis not present

## 2023-06-28 DIAGNOSIS — E1110 Type 2 diabetes mellitus with ketoacidosis without coma: Secondary | ICD-10-CM | POA: Diagnosis not present

## 2023-06-28 DIAGNOSIS — I4891 Unspecified atrial fibrillation: Secondary | ICD-10-CM | POA: Diagnosis not present

## 2023-06-28 DIAGNOSIS — R4182 Altered mental status, unspecified: Secondary | ICD-10-CM | POA: Diagnosis not present

## 2023-06-28 LAB — BASIC METABOLIC PANEL
Anion gap: 5 (ref 5–15)
BUN: 25 mg/dL — ABNORMAL HIGH (ref 8–23)
CO2: 16 mmol/L — ABNORMAL LOW (ref 22–32)
Calcium: 7.7 mg/dL — ABNORMAL LOW (ref 8.9–10.3)
Chloride: 125 mmol/L — ABNORMAL HIGH (ref 98–111)
Creatinine, Ser: 1.46 mg/dL — ABNORMAL HIGH (ref 0.61–1.24)
GFR, Estimated: 49 mL/min — ABNORMAL LOW (ref >60)
Glucose, Bld: 76 mg/dL (ref 70–99)
Potassium: 4.5 mmol/L (ref 3.5–5.1)
Sodium: 146 mmol/L — ABNORMAL HIGH (ref 135–145)

## 2023-06-28 LAB — GLUCOSE, CAPILLARY
Glucose-Capillary: 66 mg/dL — ABNORMAL LOW (ref 70–99)
Glucose-Capillary: 122 mg/dL — ABNORMAL HIGH (ref 70–99)
Glucose-Capillary: 148 mg/dL — ABNORMAL HIGH (ref 70–99)
Glucose-Capillary: 143 mg/dL — ABNORMAL HIGH (ref 70–99)
Glucose-Capillary: 131 mg/dL — ABNORMAL HIGH (ref 70–99)

## 2023-06-28 MED ORDER — POTASSIUM CHLORIDE 2 MEQ/ML IV SOLN: INTRAVENOUS | Status: DC

## 2023-06-28 MED ORDER — POTASSIUM CHLORIDE 2 MEQ/ML IV SOLN
INTRAVENOUS | Status: AC
  Filled 2023-06-28 (×4): qty 1000

## 2023-06-28 MED ORDER — INSULIN GLARGINE-YFGN 100 UNIT/ML ~~LOC~~ SOLN
19.0000 [IU] | SUBCUTANEOUS | Status: DC
  Administered 2023-06-28: 19 [IU] via SUBCUTANEOUS
  Filled 2023-06-28 (×2): qty 0.19

## 2023-06-28 NOTE — Progress Notes (Signed)
[facility]                                                                              [place], is a 77 y.o. male, DOB - 04-14-1946, WUJ:[id number] Admit date - 06/15/2023    Outpatient Primary MD for the patient is Kurth-Bowen, Cornelia, PA-C  LOS - 13  days  Chief Complaint  Patient presents with   Altered Mental Status       Brief summary   Eric Zamora is a 77 y.o. 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  Significant events: 10/15: Sent from SNF for tachpynea, found to have fever, no localizing source, admitted on antibiotics 10/17: Overnight with fever, aspiration event, new AG acidosis, transferred back to SDU for BiPAP and insulin drip 10/18: CT shows perirectal abscess, general surgery consulted; taken to OR for drainage of abscess 10/19: SLP advanced to Dys 1 diet, honey thick 10/20 to 10/22: Titrating up metoprolol for atrial fibrillation  Assessment & Plan    Principal Problem:   Severe sepsis due to perirectal abscess(HCC) -Presented with fever, leukocytosis, AKI, reported encephalopathy and hypotension.  UTI, pneumonia ruled out. -CT chest abdomen pelvis showed perirectal abscess  -General Surgery was consulted, status post I&D by Dr. Derrell Lolling on 06/18/23 -Continue Augmentin, doxycycline for 14 days - Penrose drain removed, surgery recommended to continue local wound care, sitz bath   Active problems Atrial fibrillation with RVR, new onset -New onset.  CHA2DS2-Vasc 6 (age, HTN, CHF, DM, vascular disease).  TSH normal.  Echo shows HOCM (previously known).  Normal valves. -Patient was placed on apixaban and Plavix was discontinued.  Eliquis currently on hold per neurology recommendations. -Cardiology was following, patient was placed on Lopressor 100 mg 3 times daily due to  difficulty in rate control. Outpatient cardioversion in 3 weeks if does not convert to NSR on his own. -TSH normal -Metoprolol reduced to 50 mg twice daily due to hypotension (outpatient was on 25 mg BID).     Acute metabolic encephalopathy superimposed on dementia, right MCA embolic appearing stroke -At baseline, patient is ambulatory without assistance, participates in cares, and is interactive, but has severe short term memory loss.  -Keppra was held, delirium precautions -Given lethargy on 10/24, CT head was done which showed acute/subacute cortical and subcortical right MCA territory infarct centered within the right frontal operculum/right insula,measuring 5.2 x 3.2 cm, petechial hemorrhage.  -Neurology was consulted, recommended holding Eliquis for now  -MRI brain showed intermediate-sized acute infarct of the right frontal operculum with moderate associated cytotoxic edema and petechial hemorrhage - normal intracranial MRA -PT OT, SLP following.  LDL 70 at goal.  Hemoglobin A1c 9 4 on 10/15 -2D echo 10/16 showed HOCM (previously known), RV SF normal -HIV negative, RPR reactive -Per neurology, repeat CT 1 week from initial scan, if stable, then resume apixaban.  Next CT scan on 07/01/2023.  Seizure history -Per neurology, continue to hold Depakote due to transaminitis and Keppra -EEG  normal, no seizures or epileptiform discharges  Hypertrophic cardiomyopathy, moderate MR -Initially treated with IV Lasix however now sodium has been trending up, diuretics on hold -Continue beta-blocker   Transaminitis -Possibly due to shock liver, from severe sepsis, hypotension.   - CT and US imaging of biliary tree are normal, no RUQ localizing symptoms.   -Follow LFTs   Dysphagia Aspirated with speech therapy 10/17.  MBS showed aspiration.   -SLP consulted, continue dysphagia 1 diet  -Started on nystatin S&S for oral thrush   History of ?syphilis -RPR reactive 2 years ago, rechecked and  still reactive, titer 1:2. unclear if he was ever treated. -Neurology, Dr. Amada Jupiter does not recommend inpatient LP.   -d/w ID, Dr Luciana Axe, recommended Bicillin 2,400,000 units IM x 1 while inpatient (received 06/26/2023), 2 more doses weekly for total of 3 doses, next dose on 07/03/2023   Acute renal failure superimposed on stage 3b chronic kidney disease (HCC) Cr 1.99 on admission from baseline ~1.3.  . - Lasix discontinued, received IV fluids -Cr continues to improve, 1.4    Hypernatremia -Sodium trended up to 152 on 10/24, started on D5, Lasix held -Sodium improving 146, nephrology following   Acute on chronic diastolic CHF Developed peripheral edema, hypoxia.  Treated with IV Lasix, held after sodium started trending up - Hold furosemide for now    CAD- Moderate LAD disease with moderate to severe ostial first diagonal disease by cath 07/2013 - not ammendable to PCI. Medical therapy Mixed hyperlipidemia - Continue metoprolol - Simvastatin on hold due to transaminitis - Aspirin, Plavix discontinued once patient was placed on eliquis   Essential hypertension -Continue metoprolol   Bipolar disorder (manic depression) (HCC) Not on meds for this at home, no active symptoms    Uncontrolled type 2 diabetes mellitus with hyperglycemia, without long-term current use of insulin (HCC) No prior records, not on antiglycemic medications at home.  A1c 9.4%  CBG (last 3)  Recent Labs    06/28/23 0748 06/28/23 0928 06/28/23 1150  GLUCAP 66* 122* 148*   -Hypoglycemia this morning 66,  -Decreased Semglee to 19 units daily, continue NovoLog meal coverage 3 units TID AC, moderate SSI   Diabetic ketoacidosis without coma associated with type 2 diabetes mellitus (HCC) This developed overnight 10/17.  Was on insulin drip for about 24 hours, transition back to subcutaneous insulin 10/17 in the evening   GOC -Overall poor prognosis, palliative consulted for GOC    Estimated body mass  index is 29.39 kg/m as calculated from the following:   Height as of this encounter: 5\' 5"  (1.651 m).   Weight as of this encounter: 80.1 kg.  Code Status: full  DVT Prophylaxis:  SCDs Start: 06/15/23 1459   Level of Care: Level of care: Telemetry Family Communication: Updated patient Disposition Plan:      Remains inpatient appropriate:   Awaiting SNF.   Procedures:    Consultants:   Cardiology General Surgery Neurology  Antimicrobials:   Anti-infectives (From admission, onward)    Start     Dose/Rate Route Frequency Ordered Stop   06/26/23 1500  penicillin g benzathine (BICILLIN LA) 1200000 UNIT/2ML injection 2.4 Million Units        2.4 Million Units Intramuscular  Once 06/26/23 1351 06/28/23 0018   06/24/23 2200  amoxicillin-clavulanate (AUGMENTIN) 500-125 MG per tablet 1 tablet        1 tablet Oral Every 12 hours 06/24/23 1450 07/05/23 0959   06/24/23 2200  amoxicillin-clavulanate (AUGMENTIN) 500-125 MG per  tablet 1 tablet  Status:  Discontinued        1 tablet Oral 2 times daily 06/24/23 1451 06/24/23 1451   06/21/23 1215  doxycycline (VIBRA-TABS) tablet 100 mg        100 mg Oral Every 12 hours 06/21/23 1128 07/05/23 0959   06/21/23 1215  amoxicillin-clavulanate (AUGMENTIN) 875-125 MG per tablet 1 tablet  Status:  Discontinued        1 tablet Oral Every 12 hours 06/21/23 1128 06/24/23 1450   06/20/23 1200  vancomycin (VANCOREADY) IVPB 1500 mg/300 mL  Status:  Discontinued        1,500 mg 150 mL/hr over 120 Minutes Intravenous Every 36 hours 06/20/23 0719 06/21/23 1128   06/19/23 0000  vancomycin (VANCOREADY) IVPB 1250 mg/250 mL  Status:  Discontinued        1,250 mg 166.7 mL/hr over 90 Minutes Intravenous Every 36 hours 06/18/23 0939 06/20/23 0719   06/18/23 1800  ceFEPIme (MAXIPIME) 2 g in sodium chloride 0.9 % 100 mL IVPB  Status:  Discontinued        2 g 200 mL/hr over 30 Minutes Intravenous Every 12 hours 06/18/23 0939 06/21/23 1128   06/18/23 0600  ceFEPIme  (MAXIPIME) 2 g in sodium chloride 0.9 % 100 mL IVPB  Status:  Discontinued        2 g 200 mL/hr over 30 Minutes Intravenous Every 24 hours 06/17/23 0920 06/18/23 0939   06/17/23 1400  vancomycin (VANCOCIN) IVPB 1000 mg/200 mL premix  Status:  Discontinued        1,000 mg 200 mL/hr over 60 Minutes Intravenous Every 36 hours 06/17/23 0920 06/18/23 0939   06/16/23 1800  vancomycin (VANCOREADY) IVPB 750 mg/150 mL  Status:  Discontinued        750 mg 150 mL/hr over 60 Minutes Intravenous Every 24 hours 06/15/23 1639 06/17/23 0920   06/16/23 1100  cefTRIAXone (ROCEPHIN) 2 g in sodium chloride 0.9 % 100 mL IVPB  Status:  Discontinued        2 g 200 mL/hr over 30 Minutes Intravenous Every 24 hours 06/15/23 1453 06/15/23 1455   06/15/23 1800  ceFEPIme (MAXIPIME) 2 g in sodium chloride 0.9 % 100 mL IVPB  Status:  Discontinued        2 g 200 mL/hr over 30 Minutes Intravenous Every 12 hours 06/15/23 1558 06/17/23 0920   06/15/23 1700  metroNIDAZOLE (FLAGYL) IVPB 500 mg  Status:  Discontinued        500 mg 100 mL/hr over 60 Minutes Intravenous Every 12 hours 06/15/23 1455 06/21/23 1128   06/15/23 1630  vancomycin (VANCOREADY) IVPB 1500 mg/300 mL        1,500 mg 150 mL/hr over 120 Minutes Intravenous  Once 06/15/23 1558 06/15/23 2058   06/15/23 1045  cefTRIAXone (ROCEPHIN) 2 g in sodium chloride 0.9 % 100 mL IVPB        2 g 200 mL/hr over 30 Minutes Intravenous  Once 06/15/23 1041 06/15/23 1155          Medications  amoxicillin-clavulanate  1 tablet Oral Q12H   doxycycline  100 mg Oral Q12H   feeding supplement  237 mL Oral BID BM   insulin aspart  0-15 Units Subcutaneous TID WC   insulin aspart  0-5 Units Subcutaneous QHS   insulin aspart  3 Units Subcutaneous TID WC   insulin glargine-yfgn  22 Units Subcutaneous Q24H   loperamide  2 mg Oral Once   metoprolol tartrate  50  mg Oral BID   nystatin  5 mL Oral QID   mouth rinse  15 mL Mouth Rinse 4 times per day   sodium chloride flush  3 mL  Intravenous Q12H      Subjective:   Eric Zamora was seen and examined today.  Alert and awake, has dysarthria but responds to verbal commands.  Tolerating diet.  BP better today.  Heart rate in low 100s, asymptomatic.  Objective:   Vitals:   06/27/23 1734 06/27/23 2001 06/28/23 0130 06/28/23 0613  BP: 95/61 129/82 (!) 101/59 103/68  Pulse: (!) 103 (!) 105 96 (!) 104  Resp: 19 18 18 18   Temp: 98.7 F (37.1 C) 99.9 F (37.7 C) 98.2 F (36.8 C) 98 F (36.7 C)  TempSrc:   Oral Oral  SpO2: 100% 99% 99% 100%  Weight:      Height:        Intake/Output Summary (Last 24 hours) at 06/28/2023 1229 Last data filed at 06/28/2023 0600 Gross per 24 hour  Intake 2106.55 ml  Output 400 ml  Net 1706.55 ml     Wt Readings from Last 3 Encounters:  06/18/23 80.1 kg  04/15/23 75 kg  11/05/21 75 kg   Physical Exam General: Alert and oriented x 3, NAD, ill-appearing Cardiovascular: S1 S2 clear, RRR.  Respiratory: Diminished breath sound at the bases Gastrointestinal: Soft, nontender, nondistended, NBS Ext: no pedal edema b/l Neuro: no new deficits, has mild dysarthria Psych: Normal affect   Data Reviewed:  I have personally reviewed following labs    CBC Lab Results  Component Value Date   WBC 9.2 06/26/2023   RBC 5.26 06/26/2023   HGB 15.0 06/26/2023   HCT 49.4 06/26/2023   MCV 93.9 06/26/2023   MCH 28.5 06/26/2023   PLT 298 06/26/2023   MCHC 30.4 06/26/2023   RDW 14.2 06/26/2023   LYMPHSABS 1.5 06/15/2023   MONOABS 3.2 (H) 06/15/2023   EOSABS 0.0 06/15/2023   BASOSABS 0.1 06/15/2023     Last metabolic panel Lab Results  Component Value Date   NA 146 (H) 06/28/2023   K 4.5 06/28/2023   CL 125 (H) 06/28/2023   CO2 16 (L) 06/28/2023   BUN 25 (H) 06/28/2023   CREATININE 1.46 (H) 06/28/2023   GLUCOSE 76 06/28/2023   GFRNONAA 49 (L) 06/28/2023   GFRAA 50 (L) 04/12/2020   CALCIUM 7.7 (L) 06/28/2023   PHOS 3.3 08/03/2008   PROT 7.4 06/24/2023   ALBUMIN 2.7  (L) 06/24/2023   BILITOT 0.6 06/24/2023   ALKPHOS 32 (L) 06/24/2023   AST 73 (H) 06/24/2023   ALT 85 (H) 06/24/2023   ANIONGAP 5 06/28/2023    CBG (last 3)  Recent Labs    06/28/23 0748 06/28/23 0928 06/28/23 1150  GLUCAP 66* 122* 148*      Coagulation Profile: No results for input(s): "INR", "PROTIME" in the last 168 hours.   Radiology Studies: I have personally reviewed the imaging studies  No results found.     Thad Ranger M.D. [facility] 06/28/2023, 12:29 PM  Available via Epic secure chat 7am-7pm After 7 pm, please refer to night coverage provider listed on amion.

## 2023-06-28 NOTE — Progress Notes (Signed)
  [facility] Progress Note   Subjective:   Patient seen and examined bedside. Seen in room. Follows simple commands. Difficult To understand his speaking.    Objective:   BP 107/76 (BP Location: Left Arm)   Pulse (!) 121   Temp 97.7 F (36.5 C) (Oral)   Resp 18   Ht 5\' 5"  (1.651 m)   Wt 80.1 kg   SpO2 93%   BMI 29.39 kg/m   Physical Exam: Gen: ill appearing, NAD CVS: RRR Resp: CTA B/L Abd: soft Ext: LUE swelling, no other edema Neuro: awake, following commands, dysarthric  Assessment/ Plan:   1) Acute kidney injury on CKD stage IIIb likely hemodynamically mediated due recent use of diuretics/ intravascular volume depletion and sepsis.  Recent CT scan ruled out hydronephrosis. AKI resolved. Baseline Cr around 1.5. No further suggestions. Will sign off.    2) Hypernatremia, hypovolemic: Na+ down to 146 today. Will change IVF's to D5W w/ 30 KCl/L at 100 cc/hr.  This can be dc'd when Na+ is under 140.   3) Acute stroke/acute metabolic encephalopathy H/o dementia -per primary   4) Severe sepsis due to perirectal abscess status post I&D  -Currently on Augmentin and doxycycline. Surgery following   5) A-fib with RVR: Seen by cardiologist.  On metoprolol and eliquis.  Monitor BP and heart rate.   Vinson Moselle  MD  [facility] 06/28/2023, 3:22 PM  Recent Labs  Lab 06/23/23 0544 06/24/23 1245 06/25/23 0559 06/26/23 0814 06/26/23 0906 06/27/23 0724 06/28/23 0705  HGB 14.0  --  13.8 15.0  --   --   --   ALBUMIN 2.6* 2.7*  --   --   --   --   --   CALCIUM 8.7* 8.6* 8.6*  --    < > 8.4* 7.7*  CREATININE 1.99* 2.06* 1.71*  --    < > 1.41* 1.46*  K 3.8 3.8 3.8  --    < > 3.4* 4.5   < > = values in this interval not displayed.    Inpatient medications:  amoxicillin-clavulanate  1 tablet Oral Q12H   doxycycline  100 mg Oral Q12H   feeding supplement  237 mL Oral BID BM   insulin aspart  0-15 Units Subcutaneous TID WC   insulin aspart  0-5 Units Subcutaneous QHS    insulin aspart  3 Units Subcutaneous TID WC   insulin glargine-yfgn  19 Units Subcutaneous Q24H   loperamide  2 mg Oral Once   metoprolol tartrate  50 mg Oral BID   nystatin  5 mL Oral QID   mouth rinse  15 mL Mouth Rinse 4 times per day   sodium chloride flush  3 mL Intravenous Q12H    dextrose 5 % 1,000 mL with potassium chloride 30 mEq infusion 125 mL/hr at 06/28/23 1358   acetaminophen, albuterol, ondansetron **OR** ondansetron (ZOFRAN) IV, mouth rinse, mouth rinse, oxyCODONE, traZODone

## 2023-06-28 NOTE — Progress Notes (Signed)
Physical Therapy Treatment Patient Details Name: Eric Zamora MRN: [id number] DOB: 01-Jan-1946 Today's Date: 06/28/2023   History of Present Illness Pt is 77 yo admitted with severe sepsis due to perirectal abscess.  Pt to OR on 10/18 for drainage of abscess.  Pt with increased lethargy on 10/24 , MRI Head performed and pt found to have acute infarct  right frontal operculum with moderate associated cytotoxic edema and petechial hemorrhage. PMH: bipolar disorder, dementia, history of NSTEMI, hypertrophic cardiomyopathy    PT Comments  Spoke with staff at [facility] who report pt was completely independent with mobility prior to admission.  Today, pt is able to move L side but weaker compared to R.  He leans to the right with sitting and standing - some improvement after leaning onto L elbow but little carryover.  Pt requiring mod A of 2 for standing and taking minimal steps toward HOB with max cues.  Pt not able to transfer to chair - would need lift.  Continue to recommend Patient will benefit from continued inpatient follow up therapy, <3 hours/day at d/c    If plan is discharge home, recommend the following: Two people to help with walking and/or transfers;Two people to help with bathing/dressing/bathroom;Assistance with feeding;Assistance with cooking/housework   Can travel by private vehicle     No  Equipment Recommendations  None recommended by PT    Recommendations for Other Services       Precautions / Restrictions Precautions Precautions: Fall     Mobility  Bed Mobility Overal bed mobility: Needs Assistance Bed Mobility: Rolling, Supine to Sit, Sit to Supine Rolling: Min assist   Supine to sit: Mod assist Sit to supine: Mod assist   General bed mobility comments: increased time, effort, with assist to lift trunk and scoot forward    Transfers Overall transfer level: Needs assistance Equipment used: Rolling walker (2 wheels) Transfers: Sit to/from Stand Sit  to Stand: Mod assist, +2 physical assistance           General transfer comment: Performed x 3 with heavy mod A of 2 to stand and increased time.  Pt leaning R and difficulty tucking buttock to stand upright.    Ambulation/Gait Ambulation/Gait assistance: Mod assist, +2 physical assistance Gait Distance (Feet): 1 Feet (1'x3) Assistive device: Rolling walker (2 wheels) Gait Pattern/deviations: Step-to pattern, Decreased stride length Gait velocity: decreased     General Gait Details: 3 attempts at side stepping up toward HOB, mod A for balance, RW management, weight shift, and cues for sequencing; heavy R lean   Stairs             Wheelchair Mobility     Tilt Bed    Modified Rankin (Stroke Patients Only)       Balance Overall balance assessment: Needs assistance Sitting-balance support: Bilateral upper extremity supported Sitting balance-Leahy Scale: Poor Sitting balance - Comments: Pt initially requiring mod A progressed to close supervision with min A 50% of time. Pt tending to lean to the R -worked on leaning onto L elbow multiple times during session.  Pt could briefly improve upright posture after L lean but then reverted back to R lean     Standing balance-Leahy Scale: Zero Standing balance comment: mod A x 2 R lean                            Cognition Arousal: Alert Behavior During Therapy: Flat affect Overall Cognitive Status: No  family/caregiver present to determine baseline cognitive functioning                                 General Comments: pt follows 1 step commands with increased time and cues, unintelligble speech        Exercises General Exercises - Lower Extremity Ankle Circles/Pumps: AROM, Both, 10 reps, Seated Long Arc Quad: AROM, Both, 10 reps, Seated Heel Slides: AROM, Both, 5 reps, Supine Other Exercises Other Exercises: multimodal cues    General Comments General comments (skin integrity, edema,  etc.): L UE edema      Pertinent Vitals/Pain Pain Assessment Pain Assessment: No/denies pain    Home Living                          Prior Function            PT Goals (current goals can now be found in the care plan section) Progress towards PT goals: Progressing toward goals    Frequency    Min 1X/week      PT Plan      Co-evaluation              AM-PAC PT "6 Clicks" Mobility   Outcome Measure  Help needed turning from your back to your side while in a flat bed without using bedrails?: A Lot Help needed moving from lying on your back to sitting on the side of a flat bed without using bedrails?: A Lot Help needed moving to and from a bed to a chair (including a wheelchair)?: Total Help needed standing up from a chair using your arms (e.g., wheelchair or bedside chair)?: Total Help needed to walk in hospital room?: Total Help needed climbing 3-5 steps with a railing? : Total 6 Click Score: 8    End of Session Equipment Utilized During Treatment: Gait belt Activity Tolerance: Patient tolerated treatment well Patient left: in bed;with call bell/phone within reach;with bed alarm set;with SCD's reapplied Nurse Communication: Mobility status;Need for lift equipment PT Visit Diagnosis: Other abnormalities of gait and mobility (R26.89);Muscle weakness (generalized) (M62.81)     Time: 4627-0350 PT Time Calculation (min) (ACUTE ONLY): 25 min  Charges:    $Therapeutic Activity: 8-22 mins $Neuromuscular Re-education: 8-22 mins PT General Charges $$ ACUTE PT VISIT: 1 Visit                     Anise Salvo, PT Acute Rehab [facility] [phone]    Rayetta Humphrey 06/28/2023, 2:04 PM

## 2023-06-28 NOTE — Progress Notes (Signed)
Daily Progress Note   Patient Name: Eric Zamora       Date: 06/28/2023 DOB: 09/02/1945  Age: 77 y.o. MRN#: [id number] Attending Physician: Cathren Harsh, MD Primary Care Physician: Uvaldo Bristle, PA-C Admit Date: 06/15/2023 Length of Stay: 13 days  EMR review today. Patient has been eating/drinking as able and working with therapies. Nephrology making adjustments as needed for AKI and hypernatremia. Repeating CT scan per neurology on 10/31 to see if apixaban can be resumed. PMT will continue to follow along with patient's medical journey. Please reach out if acute PMT needs arise. Thank you.     Alvester Morin, DO Palliative Care Provider PMT # [phone]

## 2023-06-29 DIAGNOSIS — E1110 Type 2 diabetes mellitus with ketoacidosis without coma: Secondary | ICD-10-CM | POA: Diagnosis not present

## 2023-06-29 DIAGNOSIS — I4891 Unspecified atrial fibrillation: Secondary | ICD-10-CM | POA: Diagnosis not present

## 2023-06-29 DIAGNOSIS — A419 Sepsis, unspecified organism: Secondary | ICD-10-CM | POA: Diagnosis not present

## 2023-06-29 DIAGNOSIS — R4182 Altered mental status, unspecified: Secondary | ICD-10-CM | POA: Diagnosis not present

## 2023-06-29 LAB — HEPATIC FUNCTION PANEL
ALT: 41 U/L (ref 0–44)
AST: 39 U/L (ref 15–41)
Albumin: 2.4 g/dL — ABNORMAL LOW (ref 3.5–5.0)
Alkaline Phosphatase: 30 U/L — ABNORMAL LOW (ref 38–126)
Bilirubin, Direct: 0.1 mg/dL (ref 0.0–0.2)
Indirect Bilirubin: 0.7 mg/dL (ref 0.3–0.9)
Total Bilirubin: 0.8 mg/dL (ref 0.3–1.2)
Total Protein: 6.5 g/dL (ref 6.5–8.1)

## 2023-06-29 LAB — BASIC METABOLIC PANEL
Anion gap: 6 (ref 5–15)
BUN: 20 mg/dL (ref 8–23)
CO2: 19 mmol/L — ABNORMAL LOW (ref 22–32)
Calcium: 7.9 mg/dL — ABNORMAL LOW (ref 8.9–10.3)
Chloride: 116 mmol/L — ABNORMAL HIGH (ref 98–111)
Creatinine, Ser: 1.21 mg/dL (ref 0.61–1.24)
GFR, Estimated: >60 mL/min (ref >60)
Glucose, Bld: 103 mg/dL — ABNORMAL HIGH (ref 70–99)
Potassium: 4 mmol/L (ref 3.5–5.1)
Sodium: 141 mmol/L (ref 135–145)

## 2023-06-29 LAB — GLUCOSE, CAPILLARY
Glucose-Capillary: 86 mg/dL (ref 70–99)
Glucose-Capillary: 127 mg/dL — ABNORMAL HIGH (ref 70–99)
Glucose-Capillary: 95 mg/dL (ref 70–99)
Glucose-Capillary: 148 mg/dL — ABNORMAL HIGH (ref 70–99)

## 2023-06-29 MED ORDER — INSULIN GLARGINE-YFGN 100 UNIT/ML ~~LOC~~ SOLN
15.0000 [IU] | SUBCUTANEOUS | Status: DC
  Administered 2023-06-29 – 2023-06-30 (×2): 15 [IU] via SUBCUTANEOUS
  Filled 2023-06-29 (×3): qty 0.15

## 2023-06-29 NOTE — Plan of Care (Signed)
Problem: Education: Goal: Ability to describe self-care measures that may prevent or decrease complications (Diabetes Survival Skills Education) will improve Outcome: Progressing Goal: Individualized Educational Video(s) Outcome: Progressing   Problem: Coping: Goal: Ability to adjust to condition or change in health will improve Outcome: Progressing   Problem: Fluid Volume: Goal: Ability to maintain a balanced intake and output will improve Outcome: Progressing   Problem: Health Behavior/Discharge Planning: Goal: Ability to identify and utilize available resources and services will improve Outcome: Progressing Goal: Ability to manage health-related needs will improve Outcome: Progressing   Problem: Metabolic: Goal: Ability to maintain appropriate glucose levels will improve Outcome: Progressing   Problem: Nutritional: Goal: Maintenance of adequate nutrition will improve Outcome: Progressing Goal: Progress toward achieving an optimal weight will improve Outcome: Progressing   Problem: Skin Integrity: Goal: Risk for impaired skin integrity will decrease Outcome: Progressing   Problem: Tissue Perfusion: Goal: Adequacy of tissue perfusion will improve Outcome: Progressing   Problem: Education: Goal: Knowledge of General Education information will improve Description: Including pain rating scale, medication(s)/side effects and non-pharmacologic comfort measures Outcome: Progressing   Problem: Health Behavior/Discharge Planning: Goal: Ability to manage health-related needs will improve Outcome: Progressing   Problem: Clinical Measurements: Goal: Ability to maintain clinical measurements within normal limits will improve Outcome: Progressing Goal: Will remain free from infection Outcome: Progressing Goal: Diagnostic test results will improve Outcome: Progressing Goal: Respiratory complications will improve Outcome: Progressing Goal: Cardiovascular complication will  be avoided Outcome: Progressing   Problem: Activity: Goal: Risk for activity intolerance will decrease Outcome: Progressing   Problem: Nutrition: Goal: Adequate nutrition will be maintained Outcome: Progressing   Problem: Coping: Goal: Level of anxiety will decrease Outcome: Progressing   Problem: Elimination: Goal: Will not experience complications related to bowel motility Outcome: Progressing Goal: Will not experience complications related to urinary retention Outcome: Progressing   Problem: Pain Managment: Goal: General experience of comfort will improve Outcome: Progressing   Problem: Safety: Goal: Ability to remain free from injury will improve Outcome: Progressing   Problem: Skin Integrity: Goal: Risk for impaired skin integrity will decrease Outcome: Progressing   Problem: Fluid Volume: Goal: Hemodynamic stability will improve Outcome: Progressing   Problem: Clinical Measurements: Goal: Diagnostic test results will improve Outcome: Progressing Goal: Signs and symptoms of infection will decrease Outcome: Progressing   Problem: Respiratory: Goal: Ability to maintain adequate ventilation will improve Outcome: Progressing   Problem: Education: Goal: Ability to describe self-care measures that may prevent or decrease complications (Diabetes Survival Skills Education) will improve Outcome: Progressing Goal: Individualized Educational Video(s) Outcome: Progressing   Problem: Cardiac: Goal: Ability to maintain an adequate cardiac output will improve Outcome: Progressing   Problem: Health Behavior/Discharge Planning: Goal: Ability to identify and utilize available resources and services will improve Outcome: Progressing Goal: Ability to manage health-related needs will improve Outcome: Progressing   Problem: Fluid Volume: Goal: Ability to achieve a balanced intake and output will improve Outcome: Progressing   Problem: Metabolic: Goal: Ability to  maintain appropriate glucose levels will improve Outcome: Progressing   Problem: Nutritional: Goal: Maintenance of adequate nutrition will improve Outcome: Progressing Goal: Maintenance of adequate weight for body size and type will improve Outcome: Progressing   Problem: Respiratory: Goal: Will regain and/or maintain adequate ventilation Outcome: Progressing   Problem: Urinary Elimination: Goal: Ability to achieve and maintain adequate renal perfusion and functioning will improve Outcome: Progressing   Problem: Education: Goal: Knowledge of disease or condition will improve Outcome: Progressing Goal: Knowledge of secondary prevention will improve (  MUST DOCUMENT ALL) Outcome: Progressing Goal: Knowledge of patient specific risk factors will improve Loraine Leriche N/A or DELETE if not current risk factor) Outcome: Progressing   Problem: Ischemic Stroke/TIA Tissue Perfusion: Goal: Complications of ischemic stroke/TIA will be minimized Outcome: Progressing

## 2023-06-29 NOTE — Progress Notes (Signed)
OT Cancellation Note  Patient Details Name: Eric Zamora MRN: [id number] DOB: 03-17-1946   Cancelled Treatment:    Reason Eval/Treat Not Completed: Other (comment) Patient sleeping in bed upon entrance in room. Patient reported that he is still eating lunch and declined therapy at this time. Made no movement towards tray and declined help for self feeding. OT to continue to follow and check back as schedule will allow.  Rosalio Loud, MS Acute Rehabilitation Department Office# [phone]  06/29/2023, 1:54 PM

## 2023-06-29 NOTE — Plan of Care (Signed)
Problem: Education: Goal: Ability to describe self-care measures that may prevent or decrease complications (Diabetes Survival Skills Education) will improve Outcome: Progressing Goal: Individualized Educational Video(s) Outcome: Progressing   Problem: Coping: Goal: Ability to adjust to condition or change in health will improve Outcome: Progressing   Problem: Fluid Volume: Goal: Ability to maintain a balanced intake and output will improve Outcome: Progressing   Problem: Health Behavior/Discharge Planning: Goal: Ability to identify and utilize available resources and services will improve Outcome: Progressing Goal: Ability to manage health-related needs will improve Outcome: Progressing   Problem: Metabolic: Goal: Ability to maintain appropriate glucose levels will improve Outcome: Progressing   Problem: Nutritional: Goal: Maintenance of adequate nutrition will improve Outcome: Progressing Goal: Progress toward achieving an optimal weight will improve Outcome: Progressing   Problem: Skin Integrity: Goal: Risk for impaired skin integrity will decrease Outcome: Progressing   Problem: Tissue Perfusion: Goal: Adequacy of tissue perfusion will improve Outcome: Progressing   Problem: Education: Goal: Knowledge of General Education information will improve Description: Including pain rating scale, medication(s)/side effects and non-pharmacologic comfort measures Outcome: Progressing   Problem: Health Behavior/Discharge Planning: Goal: Ability to manage health-related needs will improve Outcome: Progressing   Problem: Clinical Measurements: Goal: Ability to maintain clinical measurements within normal limits will improve Outcome: Progressing Goal: Will remain free from infection Outcome: Progressing Goal: Diagnostic test results will improve Outcome: Progressing Goal: Respiratory complications will improve Outcome: Progressing Goal: Cardiovascular complication will  be avoided Outcome: Progressing   Problem: Activity: Goal: Risk for activity intolerance will decrease Outcome: Progressing   Problem: Nutrition: Goal: Adequate nutrition will be maintained Outcome: Progressing   Problem: Coping: Goal: Level of anxiety will decrease Outcome: Progressing   Problem: Elimination: Goal: Will not experience complications related to bowel motility Outcome: Progressing Goal: Will not experience complications related to urinary retention Outcome: Progressing   Problem: Pain Managment: Goal: General experience of comfort will improve Outcome: Progressing   Problem: Safety: Goal: Ability to remain free from injury will improve Outcome: Progressing   Problem: Skin Integrity: Goal: Risk for impaired skin integrity will decrease Outcome: Progressing   Problem: Fluid Volume: Goal: Hemodynamic stability will improve Outcome: Progressing   Problem: Clinical Measurements: Goal: Diagnostic test results will improve Outcome: Progressing Goal: Signs and symptoms of infection will decrease Outcome: Progressing   Problem: Respiratory: Goal: Ability to maintain adequate ventilation will improve Outcome: Progressing   Problem: Education: Goal: Ability to describe self-care measures that may prevent or decrease complications (Diabetes Survival Skills Education) will improve Outcome: Progressing Goal: Individualized Educational Video(s) Outcome: Progressing   Problem: Cardiac: Goal: Ability to maintain an adequate cardiac output will improve Outcome: Progressing   Problem: Health Behavior/Discharge Planning: Goal: Ability to identify and utilize available resources and services will improve Outcome: Progressing Goal: Ability to manage health-related needs will improve Outcome: Progressing   Problem: Fluid Volume: Goal: Ability to achieve a balanced intake and output will improve Outcome: Progressing   Problem: Metabolic: Goal: Ability to  maintain appropriate glucose levels will improve Outcome: Progressing   Problem: Nutritional: Goal: Maintenance of adequate nutrition will improve Outcome: Progressing Goal: Maintenance of adequate weight for body size and type will improve Outcome: Progressing   Problem: Respiratory: Goal: Will regain and/or maintain adequate ventilation Outcome: Progressing   Problem: Urinary Elimination: Goal: Ability to achieve and maintain adequate renal perfusion and functioning will improve Outcome: Progressing   Problem: Education: Goal: Knowledge of disease or condition will improve 06/29/2023 0434 by Crawford Givens, RN Outcome: Progressing 06/29/2023  0045 by Crawford Givens, RN Outcome: Progressing Goal: Knowledge of secondary prevention will improve (MUST DOCUMENT ALL) 06/29/2023 0434 by Crawford Givens, RN Outcome: Progressing 06/29/2023 0045 by Crawford Givens, RN Outcome: Progressing Goal: Knowledge of patient specific risk factors will improve Loraine Leriche N/A or DELETE if not current risk factor) 06/29/2023 0434 by Crawford Givens, RN Outcome: Progressing 06/29/2023 0045 by Crawford Givens, RN Outcome: Progressing   Problem: Ischemic Stroke/TIA Tissue Perfusion: Goal: Complications of ischemic stroke/TIA will be minimized 06/29/2023 0434 by Crawford Givens, RN Outcome: Progressing 06/29/2023 0045 by Crawford Givens, RN Outcome: Progressing

## 2023-06-29 NOTE — Progress Notes (Signed)
[facility]                                                                              [place], is a 77 y.o. male, DOB - Aug 14, 1946, ZOX:[id number] Admit date - 06/15/2023    Outpatient Primary MD for the patient is Kurth-Bowen, Cornelia, PA-C  LOS - 14  days  Chief Complaint  Patient presents with   Altered Mental Status       Brief summary   Eric Zamora is a 77 y.o. 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  Significant events: 10/15: Sent from SNF for tachpynea, found to have fever, no localizing source, admitted on antibiotics 10/17: Overnight with fever, aspiration event, new AG acidosis, transferred back to SDU for BiPAP and insulin drip 10/18: CT shows perirectal abscess, general surgery consulted; taken to OR for drainage of abscess 10/19: SLP advanced to Dys 1 diet, honey thick 10/20 to 10/22: Titrating up metoprolol for atrial fibrillation  Assessment & Plan    Principal Problem:   Severe sepsis due to perirectal abscess(HCC) -Presented with fever, leukocytosis, AKI, reported encephalopathy and hypotension.  UTI, pneumonia ruled out. -CT chest abdomen pelvis showed perirectal abscess  -General Surgery was consulted, status post I&D by Dr. Derrell Lolling on 06/18/23 -Continue Augmentin, doxycycline for 14 days - Penrose drain removed, surgery recommended to continue local wound care, sitz bath   Active problems Atrial fibrillation with RVR, new onset -New onset.  CHA2DS2-Vasc 6 (age, HTN, CHF, DM, vascular disease).  TSH normal.  Echo shows HOCM (previously known).  Normal valves. -Patient was placed on apixaban and Plavix was discontinued.  Eliquis currently on hold per neurology recommendations. -Cardiology was consulted, patient was placed on metoprolol 100 mg 3 times daily due  to difficulty in rate control. Outpatient cardioversion in 3 weeks if does not convert to NSR on his own. -TSH normal -Metoprolol now reduced to 50 mg twice daily due to hypotension (outpatient was on 25 mg BID).     Acute metabolic encephalopathy superimposed on dementia, right MCA embolic appearing stroke -At baseline, patient is ambulatory without assistance, participates in cares, and is interactive, but has severe short term memory loss.  -Keppra was held, delirium precautions -Given lethargy on 10/24, CT head was done which showed acute/subacute cortical and subcortical right MCA territory infarct centered within the right frontal operculum/right insula,measuring 5.2 x 3.2 cm, petechial hemorrhage.  -Neurology was consulted, recommended holding Eliquis for now  -MRI brain showed intermediate-sized acute infarct of the right frontal operculum with moderate associated cytotoxic edema and petechial hemorrhage - normal intracranial MRA -PT OT, SLP following.  LDL 70 at goal.  Hemoglobin A1c 9 4 on 10/15 -2D echo 10/16 showed HOCM (previously known), RV SF normal -HIV negative, RPR reactive -Per neurology, repeat CT 1 week from initial scan, if stable, then resume apixaban.  Next CT scan on 07/01/2023.  Seizure history -Per neurology, continue to hold Depakote due to transaminitis and Keppra -  EEG normal, no seizures or epileptiform discharges  Hypertrophic cardiomyopathy, moderate MR -Initially treated with IV Lasix however due to hypernatremia, lasix dc'ed.  -Continue beta-blocker   Transaminitis -Possibly due to shock liver, from severe sepsis, hypotension.   - CT and US imaging of biliary tree are normal, no RUQ localizing symptoms.   -improved    Dysphagia Aspirated with speech therapy 10/17.  MBS showed aspiration.   -SLP consulted, continue dysphagia 1 diet  -Started on nystatin S&S for oral thrush   History of syphilis -RPR reactive 2 years ago, rechecked and still  reactive, titer 1:2. unclear if he was ever treated. -Neurology, Dr. Amada Jupiter did not recommend inpatient LP.   -d/w ID, Dr Luciana Axe, recommended Bicillin 2,400,000 units IM x 1 while inpatient (received 06/26/2023), 2 more doses weekly for total of 3 doses, next dose on 07/03/2023   Acute renal failure superimposed on stage 3b chronic kidney disease (HCC) Cr 1.99 on admission from baseline ~1.3.  . - Lasix discontinued, received IV fluids -Cr improved 1.2    Hypernatremia -Sodium trended up to 152 on 10/24, started on D5, Lasix held -Sodium now improving 141, renal following   Acute on chronic diastolic CHF Developed peripheral edema, hypoxia.  Treated with IV Lasix, held after sodium started trending up - Hold furosemide for now    CAD- Moderate LAD disease with moderate to severe ostial first diagonal disease by cath 07/2013 - not ammendable to PCI. Medical therapy Mixed hyperlipidemia - Continue metoprolol - Simvastatin on hold due to transaminitis - Aspirin, Plavix discontinued once patient was placed on eliquis   Essential hypertension -Continue metoprolol   Bipolar disorder (manic depression) (HCC) Not on meds for this at home, no active symptoms    Uncontrolled type 2 diabetes mellitus with hyperglycemia, without long-term current use of insulin (HCC) No prior records, not on antiglycemic medications at home.  A1c 9.4%  CBG (last 3)  Recent Labs    06/29/23 0757 06/29/23 1126 06/29/23 1809  GLUCAP 86 127* 95   -CBG on lower side now,  -Decreased Semglee to 15 units daily, continue moderate SSI, dc'ed meal coverage    Diabetic ketoacidosis without coma associated with type 2 diabetes mellitus (HCC) This developed overnight 10/17.  Was on insulin drip for about 24 hours, transition back to subcutaneous insulin 10/17 in the evening   GOC -Overall poor prognosis, palliative consulted for GOC    Estimated body mass index is 29.42 kg/m as calculated from the  following:   Height as of this encounter: 5\' 5"  (1.651 m).   Weight as of this encounter: 80.2 kg.  Code Status: full  DVT Prophylaxis:  SCDs Start: 06/15/23 1459   Level of Care: Level of care: Telemetry Family Communication: Updated patient Disposition Plan:      Remains inpatient appropriate:   Awaiting SNF.    Procedures:    Consultants:   Cardiology General Surgery Neurology  Antimicrobials:   Anti-infectives (From admission, onward)    Start     Dose/Rate Route Frequency Ordered Stop   06/26/23 1500  penicillin g benzathine (BICILLIN LA) 1200000 UNIT/2ML injection 2.4 Million Units        2.4 Million Units Intramuscular  Once 06/26/23 1351 06/28/23 0018   06/24/23 2200  amoxicillin-clavulanate (AUGMENTIN) 500-125 MG per tablet 1 tablet        1 tablet Oral Every 12 hours 06/24/23 1450 07/05/23 0959   06/24/23 2200  amoxicillin-clavulanate (AUGMENTIN) 500-125 MG per tablet 1 tablet  Status:  Discontinued        1 tablet Oral 2 times daily 06/24/23 1451 06/24/23 1451   06/21/23 1215  doxycycline (VIBRA-TABS) tablet 100 mg        100 mg Oral Every 12 hours 06/21/23 1128 07/05/23 0959   06/21/23 1215  amoxicillin-clavulanate (AUGMENTIN) 875-125 MG per tablet 1 tablet  Status:  Discontinued        1 tablet Oral Every 12 hours 06/21/23 1128 06/24/23 1450   06/20/23 1200  vancomycin (VANCOREADY) IVPB 1500 mg/300 mL  Status:  Discontinued        1,500 mg 150 mL/hr over 120 Minutes Intravenous Every 36 hours 06/20/23 0719 06/21/23 1128   06/19/23 0000  vancomycin (VANCOREADY) IVPB 1250 mg/250 mL  Status:  Discontinued        1,250 mg 166.7 mL/hr over 90 Minutes Intravenous Every 36 hours 06/18/23 0939 06/20/23 0719   06/18/23 1800  ceFEPIme (MAXIPIME) 2 g in sodium chloride 0.9 % 100 mL IVPB  Status:  Discontinued        2 g 200 mL/hr over 30 Minutes Intravenous Every 12 hours 06/18/23 0939 06/21/23 1128   06/18/23 0600  ceFEPIme (MAXIPIME) 2 g in sodium chloride 0.9 % 100  mL IVPB  Status:  Discontinued        2 g 200 mL/hr over 30 Minutes Intravenous Every 24 hours 06/17/23 0920 06/18/23 0939   06/17/23 1400  vancomycin (VANCOCIN) IVPB 1000 mg/200 mL premix  Status:  Discontinued        1,000 mg 200 mL/hr over 60 Minutes Intravenous Every 36 hours 06/17/23 0920 06/18/23 0939   06/16/23 1800  vancomycin (VANCOREADY) IVPB 750 mg/150 mL  Status:  Discontinued        750 mg 150 mL/hr over 60 Minutes Intravenous Every 24 hours 06/15/23 1639 06/17/23 0920   06/16/23 1100  cefTRIAXone (ROCEPHIN) 2 g in sodium chloride 0.9 % 100 mL IVPB  Status:  Discontinued        2 g 200 mL/hr over 30 Minutes Intravenous Every 24 hours 06/15/23 1453 06/15/23 1455   06/15/23 1800  ceFEPIme (MAXIPIME) 2 g in sodium chloride 0.9 % 100 mL IVPB  Status:  Discontinued        2 g 200 mL/hr over 30 Minutes Intravenous Every 12 hours 06/15/23 1558 06/17/23 0920   06/15/23 1700  metroNIDAZOLE (FLAGYL) IVPB 500 mg  Status:  Discontinued        500 mg 100 mL/hr over 60 Minutes Intravenous Every 12 hours 06/15/23 1455 06/21/23 1128   06/15/23 1630  vancomycin (VANCOREADY) IVPB 1500 mg/300 mL        1,500 mg 150 mL/hr over 120 Minutes Intravenous  Once 06/15/23 1558 06/15/23 2058   06/15/23 1045  cefTRIAXone (ROCEPHIN) 2 g in sodium chloride 0.9 % 100 mL IVPB        2 g 200 mL/hr over 30 Minutes Intravenous  Once 06/15/23 1041 06/15/23 1155          Medications  amoxicillin-clavulanate  1 tablet Oral Q12H   doxycycline  100 mg Oral Q12H   feeding supplement  237 mL Oral BID BM   insulin aspart  0-15 Units Subcutaneous TID WC   insulin aspart  0-5 Units Subcutaneous QHS   insulin aspart  3 Units Subcutaneous TID WC   insulin glargine-yfgn  19 Units Subcutaneous Q24H   loperamide  2 mg Oral Once   metoprolol tartrate  50 mg Oral BID  nystatin  5 mL Oral QID   mouth rinse  15 mL Mouth Rinse 4 times per day   sodium chloride flush  3 mL Intravenous Q12H      Subjective:    Eric Zamora was seen and examined today.  + dysarthria with left sided weakness, tolerating dysphagia 1 diet. Responds to verbal commands.  BP, HR stable now.   Objective:   Vitals:   06/29/23 0900 06/29/23 0902 06/29/23 1128 06/29/23 1711  BP: 91/60  (!) 136/125 107/67  Pulse: 87 87 79 85  Resp: 17     Temp:   98.2 F (36.8 C)   TempSrc:   Oral   SpO2: 100%  100% 93%  Weight:      Height:        Intake/Output Summary (Last 24 hours) at 06/29/2023 1817 Last data filed at 06/29/2023 0351 Gross per 24 hour  Intake 958.9 ml  Output 600 ml  Net 358.9 ml     Wt Readings from Last 3 Encounters:  06/29/23 80.2 kg  04/15/23 75 kg  11/05/21 75 kg    Physical Exam General: Alert and oriented, ill appearing, dysarthria Cardiovascular: S1 S2 clear, RRR.  Respiratory: dec BS at bases  Gastrointestinal: Soft, nontender, nondistended, NBS Ext: no pedal edema bilaterally Neuro: RUE, RLE 5/5, LUE and LLE 4/5     Data Reviewed:  I have personally reviewed following labs    CBC Lab Results  Component Value Date   WBC 9.2 06/26/2023   RBC 5.26 06/26/2023   HGB 15.0 06/26/2023   HCT 49.4 06/26/2023   MCV 93.9 06/26/2023   MCH 28.5 06/26/2023   PLT 298 06/26/2023   MCHC 30.4 06/26/2023   RDW 14.2 06/26/2023   LYMPHSABS 1.5 06/15/2023   MONOABS 3.2 (H) 06/15/2023   EOSABS 0.0 06/15/2023   BASOSABS 0.1 06/15/2023     Last metabolic panel Lab Results  Component Value Date   NA 141 06/29/2023   K 4.0 06/29/2023   CL 116 (H) 06/29/2023   CO2 19 (L) 06/29/2023   BUN 20 06/29/2023   CREATININE 1.21 06/29/2023   GLUCOSE 103 (H) 06/29/2023   GFRNONAA >60 06/29/2023   GFRAA 50 (L) 04/12/2020   CALCIUM 7.9 (L) 06/29/2023   PHOS 3.3 08/03/2008   PROT 6.5 06/29/2023   ALBUMIN 2.4 (L) 06/29/2023   BILITOT 0.8 06/29/2023   ALKPHOS 30 (L) 06/29/2023   AST 39 06/29/2023   ALT 41 06/29/2023   ANIONGAP 6 06/29/2023    CBG (last 3)  Recent Labs    06/29/23 0757  06/29/23 1126 06/29/23 1809  GLUCAP 86 127* 95      Coagulation Profile: No results for input(s): "INR", "PROTIME" in the last 168 hours.   Radiology Studies: I have personally reviewed the imaging studies  No results found.     Thad Ranger M.D. [facility] 06/29/2023, 6:17 PM  Available via Epic secure chat 7am-7pm After 7 pm, please refer to night coverage provider listed on amion.

## 2023-06-29 NOTE — Plan of Care (Signed)
  Problem: Fluid Volume: Goal: Ability to maintain a balanced intake and output will improve Outcome: Progressing   Problem: Metabolic: Goal: Ability to maintain appropriate glucose levels will improve Outcome: Progressing   Problem: Skin Integrity: Goal: Risk for impaired skin integrity will decrease Outcome: Progressing   Problem: Tissue Perfusion: Goal: Adequacy of tissue perfusion will improve Outcome: Progressing   Problem: Clinical Measurements: Goal: Will remain free from infection Outcome: Progressing Goal: Cardiovascular complication will be avoided Outcome: Progressing   Problem: Elimination: Goal: Will not experience complications related to bowel motility Outcome: Progressing Goal: Will not experience complications related to urinary retention Outcome: Progressing   Problem: Safety: Goal: Ability to remain free from injury will improve Outcome: Progressing   Problem: Skin Integrity: Goal: Risk for impaired skin integrity will decrease Outcome: Progressing   Problem: Respiratory: Goal: Ability to maintain adequate ventilation will improve Outcome: Progressing   Problem: Cardiac: Goal: Ability to maintain an adequate cardiac output will improve Outcome: Progressing

## 2023-06-30 DIAGNOSIS — A419 Sepsis, unspecified organism: Secondary | ICD-10-CM | POA: Diagnosis not present

## 2023-06-30 DIAGNOSIS — R6520 Severe sepsis without septic shock: Secondary | ICD-10-CM | POA: Diagnosis not present

## 2023-06-30 LAB — BASIC METABOLIC PANEL
Anion gap: 8 (ref 5–15)
BUN: 20 mg/dL (ref 8–23)
CO2: 19 mmol/L — ABNORMAL LOW (ref 22–32)
Calcium: 8.2 mg/dL — ABNORMAL LOW (ref 8.9–10.3)
Chloride: 110 mmol/L (ref 98–111)
Creatinine, Ser: 1.32 mg/dL — ABNORMAL HIGH (ref 0.61–1.24)
GFR, Estimated: 55 mL/min — ABNORMAL LOW (ref >60)
Glucose, Bld: 102 mg/dL — ABNORMAL HIGH (ref 70–99)
Potassium: 4.3 mmol/L (ref 3.5–5.1)
Sodium: 137 mmol/L (ref 135–145)

## 2023-06-30 LAB — CBC
HCT: 37.6 % — ABNORMAL LOW (ref 39.0–52.0)
Hemoglobin: 12.1 g/dL — ABNORMAL LOW (ref 13.0–17.0)
MCH: 28.9 pg (ref 26.0–34.0)
MCHC: 32.2 g/dL (ref 30.0–36.0)
MCV: 89.7 fL (ref 80.0–100.0)
Platelets: 172 10*3/uL (ref 150–400)
RBC: 4.19 MIL/uL — ABNORMAL LOW (ref 4.22–5.81)
RDW: 14 % (ref 11.5–15.5)
WBC: 7.8 10*3/uL (ref 4.0–10.5)
nRBC: 0 % (ref 0.0–0.2)

## 2023-06-30 LAB — GLUCOSE, CAPILLARY
Glucose-Capillary: 103 mg/dL — ABNORMAL HIGH (ref 70–99)
Glucose-Capillary: 121 mg/dL — ABNORMAL HIGH (ref 70–99)
Glucose-Capillary: 161 mg/dL — ABNORMAL HIGH (ref 70–99)
Glucose-Capillary: 118 mg/dL — ABNORMAL HIGH (ref 70–99)

## 2023-06-30 LAB — VITAMIN B1: Vitamin B1 (Thiamine): 118.6 nmol/L (ref 66.5–200.0)

## 2023-06-30 NOTE — Plan of Care (Signed)
  Problem: Nutritional: Goal: Maintenance of adequate nutrition will improve Outcome: Progressing   

## 2023-06-30 NOTE — Progress Notes (Signed)
[facility] Progress Note Patient: Eric Zamora VHQ:[id number] DOB: [birth year] DOA: 06/15/2023  DOS: the patient was seen and examined on 06/30/2023  Eric Zamora is a 77 y.o. 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    Significant events: 10/15: Sent from SNF for tachpynea, found to have fever, no localizing source, admitted on antibiotics 10/17: Overnight with fever, aspiration event, new AG acidosis, transferred back to SDU for BiPAP and insulin drip 10/18: CT shows perirectal abscess, general surgery consulted; taken to OR for drainage of abscess 10/19: SLP advanced to Dys 1 diet, honey thick 10/20 to 10/22: Titrating up metoprolol for atrial fibrillation    Significant studies: 10/15 CXR: no acute disease 10/15 CT head: NAICP 10/16 Echo: HOCM, normal EF, normal valves 10/17 CXR: new infiltrates c/w aspiration 10/18 CT chest abdomen and pelvis: 6cm perirectal abscess 10/19 MBS: Limited by cognition only   Significant microbiology data: 10/15 Blood cx x2: NGTD 10/16 COVID: Neg 10/16 RVP: Neg 10/17 Repeat blood cx: NGTD         Assessment and plan. Severe sepsis due to perirectal abscess per On oral antibiotic now. Underwent IND on 10/18. Monitor for now.  New onset A-fib with RVR. [place]. Cardiology was consulted. Currently on Lopressor Will monitor.  Acute metabolic encephalopathy. History of dementia. Right MCA stroke. Mentation significantly improving. Initially had neurology consultation. CT head showed evidence of acute/subacute cortical and subcortical right MCA territory infarct. MRI brain confirmed the infarct. Neurology recommended to repeat another CT scan on 10/31 and if stable recommended to resume Eliquis. PT OT recommended SNF.  History of seizures. Depakote on hold. On  Keppra. EEG unremarkable. Monitor.  Moderate MR. Patient the patient at high risk for heart failure. For now Keller status adequate. Monitor.  Dysphagia. Speech therapy following. Underwent MBS. Currently on dysphagia 1 diet. Renal function mildly worsening at present.  If this is placing the patient at a high risk for dehydration. Monitor.  History of syphilis. RPR reactive for 2 years. Per ID patient currently being treated with Bicillin on a weekly basis. Next dose 11/2. 2 more doses remaining.  AKI on CKD 3B. Baseline around 1.3. Worsened to 1.99. Improved significantly to normal values and now back to 1.3. For now monitoring.  Hypernatremia. Likely from poor p.o. intake. Sodium improving. Monitor.  CAD Patient not on any DAPT or antiplatelet therapy secondary to being on Eliquis.  Bipolar disorder. Mentation stable.  Not on any medication.  Monitor.  Type II DM, uncontrolled with hyperglycemia. Currently on sliding scale insulin.  Subjective: No nausea no vomiting no fever no chills were no other acute complaints.  Physical Exam: General: in Mild distress, No Rash Cardiovascular: S1 and S2 Present, No Murmur Respiratory: Good respiratory effort, Bilateral Air entry present. No Crackles, No wheezes Abdomen: Bowel Sound present, No tenderness Extremities: No edema Neuro: Alert and oriented to self only, no new focal deficit  Data Reviewed: I have Reviewed nursing notes, Vitals, and Lab results. Since last encounter, pertinent lab results CBC and BMP   . I have ordered test including CBC and BMP  .   Disposition: Status is: Inpatient Remains inpatient appropriate because: Need further workup  SCDs Start: 06/15/23 1459   Family Communication: No one at bedside Level of care: Telemetry   Vitals:   06/30/23 0500  06/30/23 0556 06/30/23 1051 06/30/23 1430  BP:  98/67 100/66 (!) 128/116  Pulse:  93  99  Resp:  20 18   Temp:  98.4 F (36.9 C) (!) 97.4  F (36.3 C) 98.8 F (37.1 C)  TempSrc:   Oral   SpO2:  100% 99% 100%  Weight: 87 kg     Height:         Author: Lynden Oxford, MD 06/30/2023 7:18 PM  Please look on [email or web address] to find out who is on call.

## 2023-06-30 NOTE — Progress Notes (Signed)
Physical Therapy Treatment Patient Details Name: Eric Zamora MRN: [id number] DOB: 11/28/1945 Today's Date: 06/30/2023   History of Present Illness Pt is 77 yo admitted with severe sepsis due to perirectal abscess.  Pt to OR on 10/18 for drainage of abscess.  Pt with increased lethargy on 10/24 , MRI Head performed and pt found to have acute infarct  right frontal operculum with moderate associated cytotoxic edema and petechial hemorrhage. PMH: bipolar disorder, dementia, history of NSTEMI, hypertrophic cardiomyopathy    PT Comments  Pt tolerating therapy well with good participation, but was fatigued and falling asleep once positioned in chair. Follows basic commands with increased time. Some improvement in R lean but does still need mod A of 2 for OOB activities.  STEDY for transfer to chair.  Cont POC.  Recommend Patient will benefit from continued inpatient follow up therapy, <3 hours/day at d/c.    If plan is discharge home, recommend the following: Two people to help with walking and/or transfers;Two people to help with bathing/dressing/bathroom   Can travel by private vehicle     No  Equipment Recommendations  None recommended by PT (defer to SNF)    Recommendations for Other Services       Precautions / Restrictions Precautions Precautions: Fall     Mobility  Bed Mobility Overal bed mobility: Needs Assistance Bed Mobility: Rolling, Supine to Sit, Sit to Supine Rolling: Mod assist   Supine to sit: Mod assist, +2 for safety/equipment     General bed mobility comments: increased time, effort, with assist to lift trunk and scoot forward    Transfers Overall transfer level: Needs assistance Equipment used: Ambulation equipment used Transfers: Sit to/from Stand, Bed to chair/wheelchair/BSC Sit to Stand: Mod assist, +2 physical assistance           General transfer comment: Performed STS x 2 with heavy mod A, increased time, cues to tuck buttock/stand straight;  STEDY to chair Transfer via Lift Equipment: Stedy  Ambulation/Gait               General Gait Details: deferred   Stairs             Wheelchair Mobility     Tilt Bed    Modified Rankin (Stroke Patients Only) Modified Rankin (Stroke Patients Only) Pre-Morbid Rankin Score: Moderate disability Modified Rankin: Severe disability     Balance Overall balance assessment: Needs assistance Sitting-balance support: Bilateral upper extremity supported Sitting balance-Leahy Scale: Poor Sitting balance - Comments: EOB for at least 10 mins; Pt with posterior pelvic tilt and posterior lean.  Not leaning to the R as much today. Worked on holding head up to visual targets, reaching for items, and leaning onto L elbow to promote improved posture.  Required min - mod A at all times today     Standing balance-Leahy Scale: Zero Standing balance comment: mod A x 2 for safety, does still lean R but some improvement; standing for 5-10 sec at a time; performed x 2                            Cognition Arousal: Lethargic Behavior During Therapy: Flat affect Overall Cognitive Status: No family/caregiver present to determine baseline cognitive functioning                                 General Comments: pt follows 1 step commands with  increased time and cues, unintelligble speech        Exercises General Exercises - Lower Extremity Ankle Circles/Pumps: AROM, Both, 20 reps, Seated Long Arc Quad: AROM, Both, Seated, 20 reps (multimodal cues for full ROM) Pt with forward head.  Once in chair worked on stretching to neutral position x 3 for 30 sec   General Comments        Pertinent Vitals/Pain Pain Assessment Pain Assessment: No/denies pain    Home Living                          Prior Function            PT Goals (current goals can now be found in the care plan section) Progress towards PT goals: Progressing toward goals     Frequency    Min 1X/week      PT Plan      Co-evaluation PT/OT/SLP Co-Evaluation/Treatment: Yes Reason for Co-Treatment: Complexity of the patient's impairments (multi-system involvement);For patient/therapist safety          AM-PAC PT "6 Clicks" Mobility   Outcome Measure  Help needed turning from your back to your side while in a flat bed without using bedrails?: A Lot Help needed moving from lying on your back to sitting on the side of a flat bed without using bedrails?: A Lot Help needed moving to and from a bed to a chair (including a wheelchair)?: Total Help needed standing up from a chair using your arms (e.g., wheelchair or bedside chair)?: Total Help needed to walk in hospital room?: Total Help needed climbing 3-5 steps with a railing? : Total 6 Click Score: 8    End of Session Equipment Utilized During Treatment: Gait belt Activity Tolerance: Patient tolerated treatment well Patient left: with call bell/phone within reach;with chair alarm set;in chair Nurse Communication: Mobility status;Need for lift equipment (likely maxi-move, could do STEDY but heavy assist of 2) PT Visit Diagnosis: Other abnormalities of gait and mobility (R26.89);Muscle weakness (generalized) (M62.81)     Time: 7829-5621 PT Time Calculation (min) (ACUTE ONLY): 30 min  Charges:    $Neuromuscular Re-education: 8-22 mins PT General Charges $$ ACUTE PT VISIT: 1 Visit                     Anise Salvo, PT Acute Rehab Services [facility] [phone]    Rayetta Humphrey 06/30/2023, 11:25 AM

## 2023-06-30 NOTE — Progress Notes (Signed)
SLP follow up for dysphagia management including trial of bolus flow cup for thin liquids. Full note to follow.  Provale cup *brown lid* given to pt with water - with him demonstrating adequate usage. Cough x1 of 5 boluses - which may have been due to aspiration - however hydration is paramount and it did not appear to cause him discomfort *he did not report discomfort.  At this time; recommend allowing thin liquids via Provale cup.    [name], MS [facility] SLP Acute [employer] [phone]

## 2023-06-30 NOTE — Progress Notes (Signed)
Occupational Therapy Treatment Patient Details Name: Eric Zamora MRN: [id number] DOB: Jan 27, 1946 Today's Date: 06/30/2023   History of present illness Pt is 77 yo admitted with severe sepsis due to perirectal abscess.  Pt to OR on 10/18 for drainage of abscess.  Pt with increased lethargy on 10/24 , MRI Head performed and pt found to have acute infarct  right frontal operculum with moderate associated cytotoxic edema and petechial hemorrhage. PMH: bipolar disorder, dementia, history of NSTEMI, hypertrophic cardiomyopathy   OT comments  Pt progressing towards OT goals this session. Working on core and sitting balance EOB while engaging in grooming tasks. Requiring up to mod A for unsupported seated balance. Set up for grooming tasks. Mod A +2 for sit<>stand transfer with the stedy. Pt initiating with multimodal cues. Max A for peri care in standing. Pt pleasant and motivated throughout session. Fatigued and demonstrating decreased activity tolerance as he was alseep in chair already as therapists left. RN staff to use maxi sky (which is working) or stedy. OT will continue to follow acutely POC remains appropriate at this time.       If plan is discharge home, recommend the following:  A lot of help with bathing/dressing/bathroom;Direct supervision/assist for medications management;Assist for transportation   Equipment Recommendations  None recommended by OT    Recommendations for Other Services      Precautions / Restrictions Precautions Precautions: Fall Restrictions Weight Bearing Restrictions: No       Mobility Bed Mobility Overal bed mobility: Needs Assistance Bed Mobility: Rolling, Supine to Sit, Sit to Supine Rolling: Mod assist   Supine to sit: Mod assist, +2 for safety/equipment     General bed mobility comments: increased time, effort, with assist to lift trunk and scoot forward    Transfers Overall transfer level: Needs assistance Equipment used: Ambulation  equipment used Transfers: Sit to/from Stand, Bed to chair/wheelchair/BSC Sit to Stand: Mod assist, +2 physical assistance           General transfer comment: Performed STS x 2 with heavy mod A, increased time, cues to tuck buttock/stand straight; STEDY to chair Transfer via Lift Equipment: Stedy   Balance Overall balance assessment: Needs assistance Sitting-balance support: Bilateral upper extremity supported Sitting balance-Leahy Scale: Poor Sitting balance - Comments: EOB for at least 10 mins; Pt with posterior pelvic tilt and posterior lean.  Not leaning to the R as much today. Worked on holding head up to visual targets, reaching for items, and leaning onto L elbow to promote improved posture.  Required min - mod A at all times today     Standing balance-Leahy Scale: Zero Standing balance comment: mod A x 2 for safety, does still lean R but some improvement; standing for 5-10 sec at a time; performed x 2                           ADL either performed or assessed with clinical judgement   ADL Overall ADL's : Needs assistance/impaired     Grooming: Wash/dry face;Oral care;Set up;Sitting Grooming Details (indicate cue type and reason): mod A for seated balance, set up for grooming task             Lower Body Dressing: Maximal assistance Lower Body Dressing Details (indicate cue type and reason): donning socks Toilet Transfer: Moderate assistance;+2 for physical assistance;+2 for safety/equipment (stedy)   Toileting- Clothing Manipulation and Hygiene: Moderate assistance;+2 for physical assistance;+2 for safety/equipment;Sit to/from stand Toileting -  Clothing Manipulation Details (indicate cue type and reason): rear peri care in standing utilizing stedy     Functional mobility during ADLs: Moderate assistance;+2 for physical assistance;+2 for safety/equipment (stedy)      Extremity/Trunk Assessment Upper Extremity Assessment Upper Extremity Assessment:  Generalized weakness (L weaker than R)   Lower Extremity Assessment Lower Extremity Assessment: Defer to PT evaluation        Vision       Perception     Praxis      Cognition Arousal: Lethargic Behavior During Therapy: Flat affect Overall Cognitive Status: No family/caregiver present to determine baseline cognitive functioning                                 General Comments: pt follows 1 step commands with increased time and cues, unintelligble speech        Exercises      Shoulder Instructions       General Comments LUE edema    Pertinent Vitals/ Pain       Pain Assessment Pain Assessment: No/denies pain  Home Living                                          Prior Functioning/Environment              Frequency  Min 1X/week        Progress Toward Goals  OT Goals(current goals can now be found in the care plan section)  Progress towards OT goals: Progressing toward goals  Acute Rehab OT Goals Patient Stated Goal: none stated OT Goal Formulation: Patient unable to participate in goal setting Time For Goal Achievement: 07/10/23 Potential to Achieve Goals: Good  Plan      Co-evaluation    PT/OT/SLP Co-Evaluation/Treatment: Yes Reason for Co-Treatment: Complexity of the patient's impairments (multi-system involvement);For patient/therapist safety PT goals addressed during session: Mobility/safety with mobility;Balance;Proper use of DME;Strengthening/ROM OT goals addressed during session: ADL's and self-care;Strengthening/ROM;Proper use of Adaptive equipment and DME      AM-PAC OT "6 Clicks" Daily Activity     Outcome Measure   Help from another person eating meals?: A Little Help from another person taking care of personal grooming?: A Little Help from another person toileting, which includes using toliet, bedpan, or urinal?: A Lot Help from another person bathing (including washing, rinsing, drying)?: A  Lot Help from another person to put on and taking off regular upper body clothing?: A Little Help from another person to put on and taking off regular lower body clothing?: A Lot 6 Click Score: 15    End of Session Equipment Utilized During Treatment: Gait belt (stedy)  OT Visit Diagnosis: Other abnormalities of gait and mobility (R26.89);Muscle weakness (generalized) (M62.81);Cognitive communication deficit (R41.841)   Activity Tolerance Patient tolerated treatment well   Patient Left in chair;with call bell/phone within reach;with chair alarm set;Other (comment) (pillows placed to combat R lat lean)   Nurse Communication Mobility status;Need for lift equipment (maxisky working)        Time: [phone] OT Time Calculation (min): 30 min  Charges: OT General Charges $OT Visit: 1 Visit OT Treatments $Self Care/Home Management : 8-22 mins  Nyoka Cowden OTR/L Acute Rehabilitation Services Office: [phone]  Evern Bio [facility] 06/30/2023, 11:58 AM

## 2023-06-30 NOTE — Progress Notes (Signed)
Daily Progress Note   Patient Name: Eric Zamora       Date: 06/30/2023 DOB: [birth year]  Age: 77 y.o. MRN#: [id number] Attending Physician: Rolly Salter, MD Primary Care Physician: Uvaldo Bristle, PA-C Admit Date: 06/15/2023  Reason for Consultation/Follow-up: Establishing goals of care  Subjective: Awake alert Sitting in bed Able to feed himself.   Length of Stay: 15  Current Medications: Scheduled Meds:   amoxicillin-clavulanate  1 tablet Oral Q12H   doxycycline  100 mg Oral Q12H   feeding supplement  237 mL Oral BID BM   insulin aspart  0-15 Units Subcutaneous TID WC   insulin aspart  0-5 Units Subcutaneous QHS   insulin glargine-yfgn  15 Units Subcutaneous Q24H   metoprolol tartrate  50 mg Oral BID   nystatin  5 mL Oral QID   mouth rinse  15 mL Mouth Rinse 4 times per day   sodium chloride flush  3 mL Intravenous Q12H    Continuous Infusions:   PRN Meds: acetaminophen, albuterol, ondansetron **OR** ondansetron (ZOFRAN) IV, mouth rinse, mouth rinse, oxyCODONE, traZODone  Physical Exam         Awake alert Sitting up in bed Able to feed himself No acute distress Has dysarthria Has left-sided weakness  Vital Signs: BP 100/66 (BP Location: Right Arm)   Pulse 93   Temp (!) 97.4 F (36.3 C) (Oral)   Resp 18   Ht 5\' 5"  (1.651 m)   Wt 87 kg   SpO2 99%   BMI 31.92 kg/m  SpO2: SpO2: 99 % O2 Device: O2 Device: Room Air O2 Flow Rate: O2 Flow Rate (L/min): 0 L/min  Intake/output summary:  Intake/Output Summary (Last 24 hours) at 06/30/2023 1426 Last data filed at 06/30/2023 0600 Gross per 24 hour  Intake --  Output 900 ml  Net -900 ml   LBM: Last BM Date : 06/28/23 Baseline Weight: Weight: 80.1 kg Most recent weight: Weight: 87 kg        Palliative Assessment/Data:      Patient Active Problem List   Diagnosis Date Noted   Palliative care encounter 06/25/2023   Acute right MCA stroke (HCC) 06/25/2023   Goals of care, counseling/discussion 06/25/2023   Transaminitis 06/18/2023   Dysphagia 06/18/2023   Diabetic ketoacidosis without coma associated with type 2 diabetes mellitus (  HCC) 06/17/2023   Acute renal failure superimposed on stage 3b chronic kidney disease (HCC) 06/16/2023   Mixed hyperlipidemia 06/16/2023   Atrial fibrillation with rapid ventricular response (HCC) 06/16/2023   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 07/02/2013   Essential hypertension 07/02/2013   Bipolar disorder (manic depression) (HCC) 07/02/2013   Severe sepsis due to perirectal abscess(HCC) 07/02/2013    Palliative Care Assessment & Plan   Patient Profile:    Assessment: 77 year old gentleman who lives at [facility] memory care assisted living facility, history of bipolar disorder dementia, stage IIIb chronic kidney disease coronary artery disease diastolic heart failure hypertension Admitted with fever tachypnea found to have perirectal abscess Hospital course complicated by severe sepsis deemed secondary to perirectal abscess, new onset of atrial fibrillation with rapid ventricular response, acute metabolic encephalopathy superimposed  on dementia, right-sided MCA embolic appearing stroke. Has underlying history of seizure disorder, moderate mitral regurgitation. Has dysphagia, recurrent aspirations, modified barium swallow done and SLP consulted and patient on dysphagia 1 diet. Palliative care following for goals of care discussions. Chart reviewed, patient seen and examined, call placed and was able to reach her legal guardian Caryn Bee at [phone].   Recommendations/Plan: Updated legal guardian with regards to scope of current hospitalization.  Patient remains full code.  Likely to undergo SNF rehab with palliative on discharge.  Continue scope of current hospitalization.    Code Status:    Code Status Orders  (From admission, onward)           Start     Ordered   06/15/23 1526  Full code  (Code Status)  Continuous       Question:  By:  Answer:  Default: patient does not have capacity for decision making, no surrogate or prior directive available   06/15/23 1525           Code Status History     Date Active Date Inactive Code Status Order ID Comments User Context   06/15/2023 1500 06/15/2023 1525 Limited: Do not attempt resuscitation (DNR) -DNR-LIMITED -Do Not Intubate/DNI  [id number]  Maryln Gottron, MD ED   07/02/2013 1417 07/10/2013 1845 Partial Code [id number]  Lupita Leash, MD ED       Prognosis:  Unable to determine  Discharge Planning: Skilled Nursing Facility for rehab with Palliative care service follow-up  Care plan was discussed with patient, legal guardian [name] also notified [phone].   Thank you for allowing the Palliative Medicine Team to assist in the care of this patient. Mod MDM Rosalin Hawking MD.      Greater than 50%  of this time was spent counseling and coordinating care related to the above assessment and plan.  Rosalin Hawking, MD  Please contact Palliative Medicine Team phone at [phone] for questions and concerns.

## 2023-06-30 NOTE — Progress Notes (Addendum)
Speech Language Pathology Treatment: Dysphagia  Patient Details Name: Eric Zamora MRN: [id number] DOB: 1946/01/30 Today's Date: 06/30/2023 Time: 0850-0901 SLP Time Calculation (min) (ACUTE ONLY): 11 min  Assessment / Plan / Recommendation Clinical Impression  Pt seen for skilled SLP to determine futility of using Provale cup (bolus flow cup) to help pt with hydration/self feeding thin liquids.  Pt readily accepted thin water and self fed via Provale cup.  He demonstrated adequate ability to self feed and demonstrated subtle cough x1 of 5 boluses.  He may have experienced a trace amount of aspiration - however hydration is paramount and it did not appear to cause him discomfort as when he overtly aspirates thin water via straw.  He remains significantly dysarthric - raising concerns for potentially advancing diet but will follow up briefly to assess for advancement readiness.      At this time; recommend allowing thin liquids via Provale cup.  Pt agreeable to plan - and RN informed - sign posted.     HPI HPI: 77 yo male adm with AMS, foul urine odor and dyspnea.  Pt is a resident of [address].  He experienced an aspiration episode yesterday with medications with applesauce and then experienced fever and respiratory difficulties requiring Bipap. Pt has h/o falls, dementia, Afib, CHF. CXR  06/17/2023 Patchy opacities throughout the lungs bilaterally, predominantly dependent, which are concerning for aspiration pneumonia/pneumonitis given the clinical concern for aspiration. 2. Small bilateral pleural effusions (right greater than left). 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 MBS completed on 10/19. Patient on dys 1 (puree) and honey thick liquids.   Reeval completed clinically and pt is now on dys1/nectar liquids.      SLP Plan   Continue with current plan of care      Recommendations for follow up therapy are one component of a  multi-disciplinary discharge planning process, led by the attending physician.  Recommendations may be updated based on patient status, additional functional criteria and insurance authorization.    Recommendations  Diet recommendations: Dysphagia 1 (puree);Nectar-thick liquid (thin ok via provale cup) Liquids provided via: Cup;Straw Medication Administration: Whole meds with puree Supervision: Patient able to self feed Compensations: Slow rate;Small sips/bites Postural Changes and/or Swallow Maneuvers: Seated upright 90 degrees;Upright 30-60 min after meal                  Oral care BID     Dysphagia, oropharyngeal phase (R13.12)     Continue with current plan of care    [name], MS [facility] SLP Acute Rehab Services Office [phone]  [name]  06/30/2023, 12:55 PM

## 2023-06-30 NOTE — Plan of Care (Signed)
  Problem: Health Behavior/Discharge Planning: Goal: Ability to identify and utilize available resources and services will improve Outcome: Not Progressing Goal: Ability to manage health-related needs will improve Outcome: Not Progressing

## 2023-06-30 NOTE — Progress Notes (Signed)
   06/29/23 2026  Assess: MEWS Score  Temp 99.2 F (37.3 C)  BP (!) 95/59  MAP (mmHg) 71  Pulse Rate (!) 128  Resp 20  Level of Consciousness Alert  SpO2 100 %  O2 Device Room Air  Assess: MEWS Score  MEWS Temp 0  MEWS Systolic 1  MEWS Pulse 2  MEWS RR 0  MEWS LOC 0  MEWS Score 3  MEWS Score Color Yellow  Assess: if the MEWS score is Yellow or Red  Were vital signs accurate and taken at a resting state? Yes  Does the patient meet 2 or more of the SIRS criteria? No  Does the patient have a confirmed or suspected source of infection? No  MEWS guidelines implemented  Yes, yellow  Treat  MEWS Interventions Considered administering scheduled or prn medications/treatments as ordered  Take Vital Signs  Increase Vital Sign Frequency  Yellow: Q2hr x1, continue Q4hrs until patient remains green for 12hrs  Escalate  MEWS: Escalate Yellow: Discuss with charge nurse and consider notifying provider and/or RRT  Provider Notification  Provider Name/Title J. Garner Nash, NP/  Date Provider Notified 06/29/23  Time Provider Notified 0000  Method of Notification Page  Notification Reason Other (Comment) (yELLOW mews)  Provider response At bedside  Date of Provider Response 06/29/23  Time of Provider Response 0020  Notify: Rapid Response  Name of Rapid Response RN Notified na  Assess: SIRS CRITERIA  SIRS Temperature  0  SIRS Pulse 1  SIRS Respirations  0  SIRS WBC 0  SIRS Score Sum  1   Repeat V/S green

## 2023-07-01 ENCOUNTER — Inpatient Hospital Stay (HOSPITAL_COMMUNITY): Payer: Medicare (Managed Care)

## 2023-07-01 DIAGNOSIS — R6520 Severe sepsis without septic shock: Secondary | ICD-10-CM | POA: Diagnosis not present

## 2023-07-01 DIAGNOSIS — A419 Sepsis, unspecified organism: Secondary | ICD-10-CM | POA: Diagnosis not present

## 2023-07-01 LAB — CBC WITH DIFFERENTIAL/PLATELET
Abs Immature Granulocytes: 0.03 10*3/uL (ref 0.00–0.07)
Basophils Absolute: 0.1 10*3/uL (ref 0.0–0.1)
Basophils Relative: 1 %
Eosinophils Absolute: 0.3 10*3/uL (ref 0.0–0.5)
Eosinophils Relative: 4 %
HCT: 40.3 % (ref 39.0–52.0)
Hemoglobin: 12.9 g/dL — ABNORMAL LOW (ref 13.0–17.0)
Immature Granulocytes: 0 %
Lymphocytes Relative: 23 %
Lymphs Abs: 1.7 10*3/uL (ref 0.7–4.0)
MCH: 28.9 pg (ref 26.0–34.0)
MCHC: 32 g/dL (ref 30.0–36.0)
MCV: 90.2 fL (ref 80.0–100.0)
Monocytes Absolute: 1 10*3/uL (ref 0.1–1.0)
Monocytes Relative: 13 %
Neutro Abs: 4.2 10*3/uL (ref 1.7–7.7)
Neutrophils Relative %: 59 %
Platelets: 183 10*3/uL (ref 150–400)
RBC: 4.47 MIL/uL (ref 4.22–5.81)
RDW: 14.4 % (ref 11.5–15.5)
WBC: 7.3 10*3/uL (ref 4.0–10.5)
nRBC: 0 % (ref 0.0–0.2)

## 2023-07-01 LAB — COMPREHENSIVE METABOLIC PANEL
ALT: 34 U/L (ref 0–44)
AST: 41 U/L (ref 15–41)
Albumin: 2.7 g/dL — ABNORMAL LOW (ref 3.5–5.0)
Alkaline Phosphatase: 32 U/L — ABNORMAL LOW (ref 38–126)
Anion gap: 6 (ref 5–15)
BUN: 26 mg/dL — ABNORMAL HIGH (ref 8–23)
CO2: 20 mmol/L — ABNORMAL LOW (ref 22–32)
Calcium: 8.3 mg/dL — ABNORMAL LOW (ref 8.9–10.3)
Chloride: 111 mmol/L (ref 98–111)
Creatinine, Ser: 1.25 mg/dL — ABNORMAL HIGH (ref 0.61–1.24)
GFR, Estimated: 59 mL/min — ABNORMAL LOW (ref >60)
Glucose, Bld: 116 mg/dL — ABNORMAL HIGH (ref 70–99)
Potassium: 4 mmol/L (ref 3.5–5.1)
Sodium: 137 mmol/L (ref 135–145)
Total Bilirubin: 1.1 mg/dL (ref 0.3–1.2)
Total Protein: 7.1 g/dL (ref 6.5–8.1)

## 2023-07-01 LAB — GLUCOSE, CAPILLARY
Glucose-Capillary: 113 mg/dL — ABNORMAL HIGH (ref 70–99)
Glucose-Capillary: 136 mg/dL — ABNORMAL HIGH (ref 70–99)
Glucose-Capillary: 179 mg/dL — ABNORMAL HIGH (ref 70–99)
Glucose-Capillary: 170 mg/dL — ABNORMAL HIGH (ref 70–99)

## 2023-07-01 MED ORDER — INSULIN GLARGINE-YFGN 100 UNIT/ML ~~LOC~~ SOLN
15.0000 [IU] | Freq: Every day | SUBCUTANEOUS | Status: DC
  Administered 2023-07-01 – 2023-07-02 (×2): 15 [IU] via SUBCUTANEOUS
  Filled 2023-07-01 (×2): qty 0.15

## 2023-07-01 MED ORDER — APIXABAN 5 MG PO TABS
5.0000 mg | ORAL_TABLET | Freq: Two times a day (BID) | ORAL | Status: DC
  Administered 2023-07-01 – 2023-07-05 (×8): 5 mg via ORAL
  Filled 2023-07-01 (×8): qty 1

## 2023-07-01 MED ORDER — PENICILLIN G BENZATHINE 1200000 UNIT/2ML IM SUSY
2.4000 10*6.[IU] | PREFILLED_SYRINGE | INTRAMUSCULAR | Status: AC
  Administered 2023-07-02 – 2023-07-09 (×2): 2.4 10*6.[IU] via INTRAMUSCULAR
  Filled 2023-07-01 (×2): qty 4

## 2023-07-01 NOTE — Progress Notes (Signed)
PHARMACY - ANTICOAGULATION CONSULT NOTE  Pharmacy Consult for Eliquis Indication: atrial fibrillation  No Known Allergies  Patient Measurements: Height: 5\' 5"  (165.1 cm) Weight: 87 kg (191 lb 12.8 oz) IBW/kg (Calculated) : 61.5 Heparin Dosing Weight:   Vital Signs: Temp: 99.1 F (37.3 C) (10/31 1218) Temp Source: Oral (10/31 1218) BP: 119/70 (10/31 1218) Pulse Rate: 83 (10/31 1218)  Labs: Recent Labs    06/29/23 0530 06/30/23 0548 07/01/23 0520  HGB  --  12.1* 12.9*  HCT  --  37.6* 40.3  PLT  --  172 183  CREATININE 1.21 1.32* 1.25*    Estimated Creatinine Clearance: 49.4 mL/min (A) (by C-G formula based on SCr of 1.25 mg/dL (H)).   Medical History: Past Medical History:  Diagnosis Date   Alzheimer disease (HCC)    Bipolar affective disorder (HCC)    Chronic renal insufficiency    Dementia (HCC)    Dyslipidemia    HTN (hypertension)    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   gradient on TEE   Syncopal episodes      Assessment: Patient is a 77 y.o M known to pharmacy from heparin consult for afib this admission. Anticoag. changed to Eliquis on 10/16, Eliquis d/ced from 10/17 - 10/18, and resumed back from 10/29 -10/24. Neurologist d/ced Eliquis on 10/24 due to acute stroke  and petechial hemorrhage noted on  head MRI. Pharmacy has been consulted on 10/31 to resume Eliquis back.    Plan:  - Eliquis 5 mg bid  - monitor for s/sx bleeding  Audiel Scheiber P 07/01/2023,3:34 PM

## 2023-07-01 NOTE — Plan of Care (Signed)
Head CT personally reviewed, agree with radiology report 1. Evolving now subacute infarct in the right frontal lobe, with slight increase in associated cytotoxic edema and unchanged petechial hemorrhage. Minimal mass effect on the right frontal horn but no midline shift. No frank hemorrhagic transformation. 2. No evidence of additional acute infarct or hemorrhage.   Given lesion is now greater than 23 days old, agree with Dr. Amada Jupiter that this stable head CT indicates risks of anticoagulation are outweighed by benefit of preventing further stroke  Neurology will sign off at this time, please do call with any questions or concerns.  Thank you for involving [place] in the care of this patient  Discussed with Dr. Allena Katz via secure chat  Brooke Dare MD-PhD [facility] [phone]

## 2023-07-01 NOTE — Plan of Care (Signed)
  Problem: Coping: Goal: Ability to adjust to condition or change in health will improve Outcome: Progressing   Problem: Fluid Volume: Goal: Ability to maintain a balanced intake and output will improve Outcome: Progressing   Problem: Health Behavior/Discharge Planning: Goal: Ability to identify and utilize available resources and services will improve Outcome: Progressing Goal: Ability to manage health-related needs will improve Outcome: Progressing   Problem: Skin Integrity: Goal: Risk for impaired skin integrity will decrease Outcome: Progressing   Problem: Education: Goal: Knowledge of General Education information will improve Description: Including pain rating scale, medication(s)/side effects and non-pharmacologic comfort measures Outcome: Progressing   Problem: Clinical Measurements: Goal: Will remain free from infection Outcome: Progressing   Problem: Nutrition: Goal: Adequate nutrition will be maintained Outcome: Progressing   Problem: Coping: Goal: Level of anxiety will decrease Outcome: Progressing   Problem: Pain Managment: Goal: General experience of comfort will improve Outcome: Progressing   Problem: Safety: Goal: Ability to remain free from injury will improve Outcome: Progressing   Problem: Skin Integrity: Goal: Risk for impaired skin integrity will decrease Outcome: Progressing

## 2023-07-01 NOTE — Progress Notes (Signed)
[facility] Progress Note Patient: Eric Zamora HYQ:[id number] DOB: 1945-12-13 DOA: 06/15/2023  DOS: the patient was seen and examined on 07/01/2023  Brief hospital course: Eric Zamora is a 77 y.o. 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 10/15 admitted to stepdown unit. 10/17: Overnight with fever, aspiration event, new AG acidosis, transferred back to SDU for BiPAP and insulin drip 10/18: taken to OR for drainage of abscess 10/23 cardiology was consulted for new onset A-fib with RVR. 10/24 nephrology was consulted for AKI and hyponatremia.  CT head was obtained for ongoing AMS and was found to have acute/subacute right MCA territory infarct.  Neurology was also consulted. 10/25.  Palliative care was consulted.  Assessment and Plan: Severe sepsis due to perirectal abscess  On oral antibiotic now. Underwent IND on 10/18. Monitor for now.   New onset A-fib with RVR. HOCM. Cardiology was consulted. Currently on Lopressor Will monitor.   Acute metabolic encephalopathy. History of dementia. Acute right MCA stroke. Mentation significantly improving. Neurology was consulted. CT head showed evidence of acute/subacute cortical and subcortical right MCA territory infarct. MRI brain confirmed the infarct. As per neurology recommendation a CT scan was repeated on 10/31.  Given that there was no hemorrhagic conversion neurology recommended to resume anticoagulation with Eliquis. PT OT recommended SNF.   History of seizures. Depakote on hold. On Keppra. EEG unremarkable. Monitor.   Moderate MR. Patient the patient at high risk for heart failure. For now volume status adequate. Monitor.   Dysphagia. Speech therapy following. Underwent MBS. Currently on dysphagia 1 diet. Renal function mildly worsening at  present.  If this is placing the patient at a high risk for dehydration. Repeat MBS tomorrow.   History of syphilis. RPR reactive for 2 years. Per ID patient currently being treated with Bicillin on a weekly basis. Next dose 11/2. 2 more doses remaining.   AKI on CKD 3B. Baseline around 1.3. Worsened to 1.99. Improved significantly to normal values and now back to 1.3. For now monitoring.   Hypernatremia. Likely from poor p.o. intake. Sodium normal. Monitor.   CAD Patient not on any DAPT or antiplatelet therapy secondary to being on Eliquis.   Bipolar disorder. Mentation stable.  Not on any medication.  Monitor.   Type II DM, uncontrolled with hyperglycemia. Currently on sliding scale insulin.   Subjective: No nausea no vomiting no fever no chills.  No other acute complaint.  Physical Exam: General: in Mild distress, No Rash Cardiovascular: S1 and S2 Present, No Murmur Respiratory: Good respiratory effort, Bilateral Air entry present. No Crackles, No wheezes Abdomen: Bowel Sound present, No tenderness Extremities: No edema Neuro: Alert and oriented x3, no new focal deficit significant dysarthria.  Generalized left-sided weakness.  Data Reviewed: I have Reviewed nursing notes, Vitals, and Lab results. Since last encounter, pertinent lab results CBC and BMP   . I have ordered test including CBC and BMP  . I have discussed pt's care plan and test results with neurology  . I have ordered imaging CT head  .   Disposition: Status is: Inpatient Remains inpatient appropriate because: Monitor for improvement in mentation  SCDs Start: 06/15/23 1459 apixaban (ELIQUIS) tablet 5 mg   Family Communication: No one at bedside Level of care: Telemetry   Vitals:   06/30/23 1430 06/30/23 2031 07/01/23 0531  07/01/23 1218  BP: (!) 128/116 98/62 109/72 119/70  Pulse: 99 97 69 83  Resp:  18 18   Temp: 98.8 F (37.1 C) 98.4 F (36.9 C) 98.1 F (36.7 C) 99.1 F (37.3 C)   TempSrc:  Oral Oral Oral  SpO2: 100% 95% 100% 100%  Weight:      Height:         Author: Lynden Oxford, MD 07/01/2023 6:40 PM  Please look on [email or web address] to find out who is on call.

## 2023-07-01 NOTE — Plan of Care (Signed)
Problem: Education: Goal: Ability to describe self-care measures that may prevent or decrease complications (Diabetes Survival Skills Education) will improve Outcome: Progressing Goal: Individualized Educational Video(s) Outcome: Progressing   Problem: Coping: Goal: Ability to adjust to condition or change in health will improve Outcome: Progressing   Problem: Fluid Volume: Goal: Ability to maintain a balanced intake and output will improve Outcome: Progressing   Problem: Health Behavior/Discharge Planning: Goal: Ability to identify and utilize available resources and services will improve Outcome: Progressing Goal: Ability to manage health-related needs will improve Outcome: Progressing   Problem: Metabolic: Goal: Ability to maintain appropriate glucose levels will improve Outcome: Progressing   Problem: Nutritional: Goal: Maintenance of adequate nutrition will improve Outcome: Progressing Goal: Progress toward achieving an optimal weight will improve Outcome: Progressing   Problem: Tissue Perfusion: Goal: Adequacy of tissue perfusion will improve Outcome: Progressing   Problem: Skin Integrity: Goal: Risk for impaired skin integrity will decrease Outcome: Progressing   Problem: Education: Goal: Knowledge of General Education information will improve Description: Including pain rating scale, medication(s)/side effects and non-pharmacologic comfort measures Outcome: Progressing   Problem: Health Behavior/Discharge Planning: Goal: Ability to manage health-related needs will improve Outcome: Progressing   Problem: Activity: Goal: Risk for activity intolerance will decrease Outcome: Progressing   Problem: Coping: Goal: Level of anxiety will decrease Outcome: Progressing   Problem: Elimination: Goal: Will not experience complications related to bowel motility Outcome: Progressing Goal: Will not experience complications related to urinary retention Outcome:  Progressing   Problem: Pain Managment: Goal: General experience of comfort will improve Outcome: Progressing   Problem: Skin Integrity: Goal: Risk for impaired skin integrity will decrease Outcome: Progressing   Problem: Fluid Volume: Goal: Hemodynamic stability will improve Outcome: Progressing   Problem: Clinical Measurements: Goal: Diagnostic test results will improve Outcome: Progressing Goal: Signs and symptoms of infection will decrease Outcome: Progressing   Problem: Education: Goal: Ability to describe self-care measures that may prevent or decrease complications (Diabetes Survival Skills Education) will improve Outcome: Progressing Goal: Individualized Educational Video(s) Outcome: Progressing   Problem: Cardiac: Goal: Ability to maintain an adequate cardiac output will improve Outcome: Progressing   Problem: Health Behavior/Discharge Planning: Goal: Ability to identify and utilize available resources and services will improve Outcome: Progressing Goal: Ability to manage health-related needs will improve Outcome: Progressing   Problem: Health Behavior/Discharge Planning: Goal: Ability to identify and utilize available resources and services will improve Outcome: Progressing Goal: Ability to manage health-related needs will improve Outcome: Progressing   Problem: Fluid Volume: Goal: Ability to achieve a balanced intake and output will improve Outcome: Progressing   Problem: Metabolic: Goal: Ability to maintain appropriate glucose levels will improve Outcome: Progressing   Problem: Nutritional: Goal: Maintenance of adequate nutrition will improve Outcome: Progressing Goal: Maintenance of adequate weight for body size and type will improve Outcome: Progressing   Problem: Urinary Elimination: Goal: Ability to achieve and maintain adequate renal perfusion and functioning will improve Outcome: Progressing   Problem: Education: Goal: Knowledge of disease  or condition will improve Outcome: Progressing Goal: Knowledge of secondary prevention will improve (MUST DOCUMENT ALL) Outcome: Progressing Goal: Knowledge of patient specific risk factors will improve Loraine Leriche N/A or DELETE if not current risk factor) Outcome: Progressing   Problem: Education: Goal: Knowledge of disease or condition will improve Outcome: Progressing Goal: Knowledge of secondary prevention will improve (MUST DOCUMENT ALL) Outcome: Progressing Goal: Knowledge of patient specific risk factors will improve Loraine Leriche N/A or DELETE if not current risk factor) Outcome: Progressing

## 2023-07-01 NOTE — Progress Notes (Addendum)
Speech Language Pathology Treatment: Dysphagia  Patient Details Name: Eric Zamora MRN: [id number] DOB: 11-Apr-1946 Today's Date: 07/01/2023 Time: 4098-1191 SLP Time Calculation (min) (ACUTE ONLY): 35 min  Assessment / Plan / Recommendation Clinical Impression  Patient seen to assess p.o. tolerance as well as use of bolus flow prevail cup.  Patient greeted sitting upright with his eyes closed with frequent wincing and it appears that he has sleep in his eyes.  He denies discomfort however.  Note MD documentation concerning for patient potentially having dehydration.  Per review of prior MBS on October 19, patient experienced silent aspiration of thin and nectar thick liquids.  At that time it was recommended he have dysphagia 1 and honey thick liquid.  Since that time his diet has been able to be advanced to allow nectar thick liquids as well as thin water via Provale cup.  Provale cup releases 10 cc of thin liquid with each elevation allowing patient to better manage thin liquid consumption.  SLP facilitated p.o. intake during session by using compensation strategies including following solids with liquids, using very moist texture like ice cream to moisten solids, counting to help initiate swallow, and verbal cues to swallow.  Most effective strategy was following solids with liquids.  Use of straw with nectar thick liquids revealed no indication of airway compromise.  However with patient drinking thin soda via cup, he did cough x 1 of approximately 10 boluses likely due to impaired timing of swallow.  After subtle cough, patient stated "that is enough" which SLP has noted is a pattern with this patient Patient observed to decline further intake as soon as he coughs with thin liquid.  Counting 1, 2, 3. To help elicit swallow did not result in improved oral transiting of partially masticated cracker.  Patient states he drinks "what they give me" and denies displeasure or dysgeusia with nectar  with nectar consistencies.   Also noted white and tan coating on patient's tongue, worrisome for possible oral candidiasis.  Patient denies discomfort with this however it could impact his desire to consume intake.   ? Oral candidiasis?   Messaged MD and send him picture as well as obtain permission to repeat MBS study tomorrow and x-ray to assure patient is on the least restrictive diet and or implement helpful dysphagia mitigation strategies.  Patient agreeable to proceed with MBS tomorrow.  Nurse reports he has provided patient with excellent oral care today, SLP expressed gratitude to him for his care.       HPI HPI: 77 yo male adm with AMS, foul urine odor and dyspnea.  Pt is a resident of [address].  He experienced an aspiration episode yesterday with medications with applesauce and then experienced fever and respiratory difficulties requiring Bipap. Pt has h/o falls, dementia, Afib, CHF. CXR  06/17/2023 Patchy opacities throughout the lungs bilaterally, predominantly dependent, which are concerning for aspiration pneumonia/pneumonitis given the clinical concern for aspiration. 2. Small bilateral pleural effusions (right greater than left). 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 MBS completed on 10/19. Patient on dys 1 (puree) and honey thick liquids.   Reeval completed clinically and pt is now on dys1/nectar liquids.      SLP Plan  MBS      Recommendations for follow up therapy are one component of a multi-disciplinary discharge planning process, led by the attending physician.  Recommendations may be updated based on patient status, additional functional criteria and insurance authorization.  Recommendations  Diet recommendations: Dysphagia 1 (puree);Nectar-thick liquid (thin water via provale cup) Liquids provided via: Cup;Straw Medication Administration: Whole meds with puree Supervision: Patient able to self feed (intermittent  supervision to encourage po intake) Compensations: Slow rate;Small sips/bites Postural Changes and/or Swallow Maneuvers: Seated upright 90 degrees;Upright 30-60 min after meal                  Oral care BID;Other (Comment) (concern for oral candidiasis)     Dysphagia, oropharyngeal phase (R13.12)     MBS   Eric Infante, MS [facility] SLP Acute Rehab Services Office [phone]   Eric Zamora  07/01/2023, 5:04 PM

## 2023-07-02 DIAGNOSIS — R6520 Severe sepsis without septic shock: Secondary | ICD-10-CM

## 2023-07-02 DIAGNOSIS — A419 Sepsis, unspecified organism: Secondary | ICD-10-CM | POA: Diagnosis not present

## 2023-07-02 LAB — GLUCOSE, CAPILLARY
Glucose-Capillary: 150 mg/dL — ABNORMAL HIGH (ref 70–99)
Glucose-Capillary: 166 mg/dL — ABNORMAL HIGH (ref 70–99)
Glucose-Capillary: 162 mg/dL — ABNORMAL HIGH (ref 70–99)
Glucose-Capillary: 161 mg/dL — ABNORMAL HIGH (ref 70–99)
Glucose-Capillary: 146 mg/dL — ABNORMAL HIGH (ref 70–99)

## 2023-07-02 LAB — CBC WITH DIFFERENTIAL/PLATELET
Abs Immature Granulocytes: 0.03 10*3/uL (ref 0.00–0.07)
Basophils Absolute: 0.1 10*3/uL (ref 0.0–0.1)
Basophils Relative: 1 %
Eosinophils Absolute: 0.2 10*3/uL (ref 0.0–0.5)
Eosinophils Relative: 2 %
HCT: 38.5 % — ABNORMAL LOW (ref 39.0–52.0)
Hemoglobin: 12.3 g/dL — ABNORMAL LOW (ref 13.0–17.0)
Immature Granulocytes: 0 %
Lymphocytes Relative: 19 %
Lymphs Abs: 1.5 10*3/uL (ref 0.7–4.0)
MCH: 29.2 pg (ref 26.0–34.0)
MCHC: 31.9 g/dL (ref 30.0–36.0)
MCV: 91.4 fL (ref 80.0–100.0)
Monocytes Absolute: 1.1 10*3/uL — ABNORMAL HIGH (ref 0.1–1.0)
Monocytes Relative: 13 %
Neutro Abs: 5.1 10*3/uL (ref 1.7–7.7)
Neutrophils Relative %: 65 %
Platelets: 184 10*3/uL (ref 150–400)
RBC: 4.21 MIL/uL — ABNORMAL LOW (ref 4.22–5.81)
RDW: 14.6 % (ref 11.5–15.5)
WBC: 7.9 10*3/uL (ref 4.0–10.5)
nRBC: 0 % (ref 0.0–0.2)

## 2023-07-02 LAB — COMPREHENSIVE METABOLIC PANEL
ALT: 28 U/L (ref 0–44)
AST: 33 U/L (ref 15–41)
Albumin: 2.6 g/dL — ABNORMAL LOW (ref 3.5–5.0)
Alkaline Phosphatase: 31 U/L — ABNORMAL LOW (ref 38–126)
Anion gap: 11 (ref 5–15)
BUN: 24 mg/dL — ABNORMAL HIGH (ref 8–23)
CO2: 18 mmol/L — ABNORMAL LOW (ref 22–32)
Calcium: 8.4 mg/dL — ABNORMAL LOW (ref 8.9–10.3)
Chloride: 108 mmol/L (ref 98–111)
Creatinine, Ser: 1.2 mg/dL (ref 0.61–1.24)
GFR, Estimated: >60 mL/min (ref >60)
Glucose, Bld: 149 mg/dL — ABNORMAL HIGH (ref 70–99)
Potassium: 4.2 mmol/L (ref 3.5–5.1)
Sodium: 137 mmol/L (ref 135–145)
Total Bilirubin: 1 mg/dL (ref 0.3–1.2)
Total Protein: 6.9 g/dL (ref 6.5–8.1)

## 2023-07-02 MED ORDER — METOPROLOL TARTRATE 50 MG PO TABS: 75.0000 mg | ORAL_TABLET | Freq: Two times a day (BID) | ORAL | Status: DC

## 2023-07-02 MED ORDER — METOPROLOL TARTRATE 5 MG/5ML IV SOLN
2.5000 mg | INTRAVENOUS | Status: DC | PRN
  Administered 2023-07-02: 2.5 mg via INTRAVENOUS
  Filled 2023-07-02: qty 5

## 2023-07-02 MED ORDER — METOPROLOL SUCCINATE ER 50 MG PO TB24
75.0000 mg | ORAL_TABLET | Freq: Two times a day (BID) | ORAL | Status: DC
  Administered 2023-07-03: 75 mg via ORAL
  Filled 2023-07-02: qty 1

## 2023-07-02 MED ORDER — FLUCONAZOLE 100 MG PO TABS
100.0000 mg | ORAL_TABLET | Freq: Every day | ORAL | Status: DC
  Administered 2023-07-02 – 2023-07-05 (×4): 100 mg via ORAL
  Filled 2023-07-02 (×4): qty 1

## 2023-07-02 NOTE — Progress Notes (Signed)
SLP Cancellation Note  Patient Details Name: Eric Zamora MRN: [id number] DOB: Nov 21, 1945   Cancelled treatment:       Reason Eval/Treat Not Completed: (P)  (pt has been tachycardic all night while sleeping, not stable to leave the floor; will continue efforts) [name], MS [facility] SLP Acute Rehab Services Office [phone]   [name] 07/02/2023, 8:35 AM

## 2023-07-02 NOTE — Progress Notes (Signed)
Physical Therapy Treatment Patient Details Name: Eric Zamora MRN: [id number] DOB: [birth year] Today's Date: 07/02/2023   History of Present Illness Pt is 77 yo admitted with severe sepsis due to perirectal abscess.  Pt to OR on 10/18 for drainage of abscess.  Pt with increased lethargy on 10/24 , MRI Head performed and pt found to have acute infarct  right frontal operculum with moderate associated cytotoxic edema and petechial hemorrhage. PMH: bipolar disorder, dementia, history of NSTEMI, hypertrophic cardiomyopathy    PT Comments  Pt with gradual progress. He demonstrated some improvement in sitting balance and ability to stand into STEDY; but does still need assist of 2.  Tolerated pre-gait in STEDY but fatigues easily.  Pt participating well and follows simple commands, needs multimodal cues with more complex commands.  Will continue POC with recommendation Patient will benefit from continued inpatient follow up therapy, <3 hours/day at d/c.    If plan is discharge home, recommend the following: Two people to help with walking and/or transfers;Two people to help with bathing/dressing/bathroom   Can travel by private vehicle     No  Equipment Recommendations  None recommended by PT    Recommendations for Other Services       Precautions / Restrictions Precautions Precautions: Fall Precaution Comments: monitor HR     Mobility  Bed Mobility Overal bed mobility: Needs Assistance Bed Mobility: Rolling, Supine to Sit Rolling: Min assist   Supine to sit: Mod assist, +2 for safety/equipment     General bed mobility comments: increased time, effort, with min A to lift trunk but mod A to scoot forward    Transfers Overall transfer level: Needs assistance Equipment used: Ambulation equipment used Transfers: Sit to/from Stand, Bed to chair/wheelchair/BSC Sit to Stand: Mod assist, +2 physical assistance           General transfer comment: Performed STS x 1 from bed and x  4 from STEDY with mod A of 2.  Cues to pull up on STEDY bar and tuck buttock.  STEDY for pivot to chair Transfer via Lift Equipment: Stedy  Ambulation/Gait             Pre-gait activities: Weight shifting and stepping in place in STEDY.  Pt able to take 4 steps x 2 in STEDY with mod A x 2 and tactile cues     Stairs             Wheelchair Mobility     Tilt Bed    Modified Rankin (Stroke Patients Only) Modified Rankin (Stroke Patients Only) Pre-Morbid Rankin Score: Moderate disability Modified Rankin: Severe disability     Balance Overall balance assessment: Needs assistance Sitting-balance support: Bilateral upper extremity supported Sitting balance-Leahy Scale: Poor Sitting balance - Comments: EOB for 8 mins.  Pt with posterior pelvic tilt and rounded shoulder tending to posterior lean.  Worked on decreasing lean by having pt reach forward to targets or reach down toward feet.  Needing CGA to min A at all times     Standing balance-Leahy Scale: Poor Standing balance comment: Stood in STEDY with assist of 2 for safety; mod A dynamic, min A static until he fatigued then mod A; performed 10-15 sec x 3                            Cognition Arousal: Alert Behavior During Therapy: Flat affect Overall Cognitive Status: No family/caregiver present to determine baseline cognitive functioning  General Comments: Pt follows 1 step commands; Speech often unintelligble        Exercises General Exercises - Lower Extremity Ankle Circles/Pumps: AROM, Both, 20 reps, Seated Long Arc Quad: AROM, Both, Seated, 20 reps Heel Slides: AROM, Both, 5 reps, Supine Hip ABduction/ADduction: AROM, Both, 5 reps, Supine Other Exercises Other Exercises: multimodal cues  Pt with forward head and rounded shoulders.  Suspect baseline, but did work on stretching when in supine.  Laid flat , removed pillow, and had pt lay head and  shoulder back to bed.  30 sec x 2.    General Comments General comments (skin integrity, edema, etc.): Pt in afib with HR 90's-110's rest and 120's-130's activity      Pertinent Vitals/Pain Pain Assessment Pain Assessment: No/denies pain Pain Location: Pt denies pain but noted facial wincing/grunting with transfers    Home Living                          Prior Function            PT Goals (current goals can now be found in the care plan section) Progress towards PT goals: Progressing toward goals    Frequency    Min 1X/week      PT Plan      Co-evaluation              AM-PAC PT "6 Clicks" Mobility   Outcome Measure  Help needed turning from your back to your side while in a flat bed without using bedrails?: A Lot Help needed moving from lying on your back to sitting on the side of a flat bed without using bedrails?: A Lot Help needed moving to and from a bed to a chair (including a wheelchair)?: Total Help needed standing up from a chair using your arms (e.g., wheelchair or bedside chair)?: Total Help needed to walk in hospital room?: Total Help needed climbing 3-5 steps with a railing? : Total 6 Click Score: 8    End of Session Equipment Utilized During Treatment: Gait belt Activity Tolerance: Patient tolerated treatment well Patient left: with call bell/phone within reach;with chair alarm set;in chair Nurse Communication: Mobility status;Need for lift equipment (STEDY vs Maximove (maximove pad in place)) PT Visit Diagnosis: Other abnormalities of gait and mobility (R26.89);Muscle weakness (generalized) (M62.81)     Time: 4540-9811 PT Time Calculation (min) (ACUTE ONLY): 24 min  Charges:    $Neuromuscular Re-education: 23-37 mins PT General Charges $$ ACUTE PT VISIT: 1 Visit                     Anise Salvo, PT Acute Rehab [facility] Re[facility] [phone]    Rayetta Humphrey 07/02/2023, 3:50 PM

## 2023-07-02 NOTE — Progress Notes (Signed)
Speech Language Pathology Treatment: Dysphagia  Patient Details Name: Eric Zamora MRN: [id number] DOB: December 23, 1945 Today's Date: 07/02/2023 Time: 7829-5621 SLP Time Calculation (min) (ACUTE ONLY): 30 min  Assessment / Plan / Recommendation Clinical Impression  Patient seen to assure tolerance of p.o. diet and assess for solid food advancement. Per RN he is awake and asking for water therefore SLP followed up with patient. Observed patient with few small bites of moist honey bun and pured raspberries to aid in orally transiting masticated honey bun. In addition patient consumed approximately 4 ounces of thickened water, 4 ounces tea, 1 ounce ginger ale, 1 ounce Ensure and then subsequently declined to consume any more intake. At this time he appeared to gag when attempting to masticate small bites of pastry - which is concerning for solid aspiration. He does not follow directions to follow solids with puree consistently - and thus rehab potential may be compromised. Overt cough x1 noted with Ensure - when pt consuming at rapid rate. Will continue to follow for mbs readiness. RN informed. Requested to order extra drinks with pt's meals.    HPI HPI: 77 yo male adm with AMS, foul urine odor and dyspnea.  Pt is a resident of [address].  He experienced an aspiration episode yesterday with medications with applesauce and then experienced fever and respiratory difficulties requiring Bipap. Pt has h/o falls, dementia, Afib, CHF. CXR  06/17/2023 Patchy opacities throughout the lungs bilaterally, predominantly dependent, which are concerning for aspiration pneumonia/pneumonitis given the clinical concern for aspiration. 2. Small bilateral pleural effusions (right greater than left). 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 MBS completed on 10/19. Patient on dys 1 (puree) and honey thick liquids.   Reeval completed clinically and pt is now on dys1/nectar liquids.       SLP Plan  MBS      Recommendations for follow up therapy are one component of a multi-disciplinary discharge planning process, led by the attending physician.  Recommendations may be updated based on patient status, additional functional criteria and insurance authorization.    Recommendations  Diet recommendations: Dysphagia 1 (puree);Nectar-thick liquid Liquids provided via: Cup;Straw Medication Administration: Whole meds with puree Supervision: Patient able to self feed (intermittent supervision to encourage po intake) Compensations: Slow rate;Small sips/bites Postural Changes and/or Swallow Maneuvers: Seated upright 90 degrees;Upright 30-60 min after meal                  Oral care BID;Other (Comment) (concern for oral candidiasis)     Dysphagia, oropharyngeal phase (R13.12)     MBS    [name], MS [facility] SLP Acute Rehab Services Office [phone]  [name]  07/02/2023, 2:13 PM

## 2023-07-02 NOTE — Progress Notes (Signed)
[facility] Progress Note Patient: Eric Zamora MWU:[id number] DOB: [birth year] DOA: 06/15/2023  DOS: the patient was seen and examined on 07/02/2023  Brief hospital course: Mr. Eric Zamora is a 77 y.o. 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, and HLD who admitted on 10/15 for severe sepsis due to perirectal abscess. 10/15 admitted to stepdown unit. 10/17: Overnight with fever, aspiration event, new AG acidosis, transferred back to SDU for BiPAP and insulin drip 10/18: taken to OR for drainage of abscess 10/23 cardiology was consulted for new onset A-fib with RVR. 10/24 nephrology was consulted for AKI and hyponatremia.  CT head was obtained for ongoing AMS and was found to have acute/subacute right MCA territory infarct.  Neurology was also consulted. 10/25.  Palliative care was consulted.  Assessment and Plan: Severe sepsis due to perirectal abscess  On oral antibiotic now. Underwent IND on 10/18. Monitor for now.   New onset A-fib with RVR. HOCM. Cardiology was consulted. Currently on Lopressor due to ongoing elevated heart rate as well as blood pressure issues.  I will change the regimen to Toprol-XL 75 mg.  Also use IV Lopressor as needed.. Will monitor.   Acute metabolic encephalopathy. History of dementia. Acute right MCA stroke. Mentation significantly improving. Neurology was consulted. CT head showed evidence of acute/subacute cortical and subcortical right MCA territory infarct. MRI brain confirmed the infarct. As per neurology recommendation a CT scan was repeated on 10/31.  Given that there was no hemorrhagic conversion neurology recommended to resume anticoagulation with Eliquis. PT OT recommended SNF.   History of seizures. Depakote on hold. On Keppra. EEG unremarkable. Monitor.   Moderate MR. Patient the patient at high risk for heart failure. For now volume status  adequate. Monitor.   Dysphagia. Speech therapy following. Underwent MBS. Currently on dysphagia 1 diet. Renal function mildly worsening at present.  If this is placing the patient at a high risk for dehydration. Repeat MBS tomorrow.   History of syphilis. RPR reactive for 2 years. Per ID patient currently being treated with Bicillin on a weekly basis. Next dose 11/8.  Last dose.   AKI on CKD 3B. Baseline around 1.3. Worsened to 1.99. Improved significantly to normal values and now back to 1.3. For now monitoring.   Hypernatremia. Likely from poor p.o. intake. Sodium normal. Monitor.   CAD Patient not on any DAPT or antiplatelet therapy secondary to being on Eliquis.   Bipolar disorder. Mentation stable.  Not on any medication.  Monitor.   Type II DM, uncontrolled with hyperglycemia. Currently on sliding scale insulin.  Loose BM. For now we will monitor for No further BM reported by the patient RN.   Subjective: No nausea or vomiting.  Heart rate remains elevated.  No other acute complaint.  Had an accident with large volume of watery stool today.  Physical Exam: General: in Mild distress, No Rash Cardiovascular: S1 and S2 Present, No Murmur Respiratory: Good respiratory effort, Bilateral Air entry present. No Crackles, No wheezes Abdomen: Bowel Sound present, No tenderness Extremities: No edema Neuro: Alert and oriented x3, no new focal deficit significant dysarthria with left-sided weakness.  Data Reviewed: I have Reviewed nursing notes, Vitals, and Lab results. Since last encounter, pertinent lab results CBC and CMP   . I have ordered test including CBC and CMP  .   Disposition: Status is: Inpatient Remains inpatient appropriate because: Need further workup  with improvement in mentation.  SCDs Start: 06/15/23 1459 apixaban (ELIQUIS) tablet 5 mg   Family Communication: No one at bedside Level of care: Telemetry   Vitals:   07/02/23 0455 07/02/23 0948  07/02/23 1212 07/02/23 1700  BP: 139/86 114/66 122/64 (!) 126/95  Pulse: 82 82 95 90  Resp: 18     Temp: 98.2 F (36.8 C)  98.4 F (36.9 C) 98.3 F (36.8 C)  TempSrc: Oral     SpO2: 98%  100% 100%  Weight:      Height:         Author: Lynden Oxford, MD 07/02/2023 6:37 PM  Please look on [email or web address] to find out who is on call.

## 2023-07-02 NOTE — Plan of Care (Signed)
Pt remains tachy throughout shift. Mouth droop has improved. However pt refusing dinner. Problem: Nutritional: Goal: Maintenance of adequate nutrition will improve Outcome: Not Progressing Goal: Progress toward achieving an optimal weight will improve Outcome: Not Progressing   Problem: Health Behavior/Discharge Planning: Goal: Ability to manage health-related needs will improve Outcome: Not Progressing   Problem: Activity: Goal: Risk for activity intolerance will decrease Outcome: Not Progressing

## 2023-07-02 NOTE — TOC Progression Note (Signed)
Transition of Care [facility]) - Progression Note    Patient Details  Name: Eric Zamora MRN: [id number] Date of Birth: 1946-05-23  Transition of Care [facility]) CM/SW Contact  Otelia Santee, LCSW Phone Number: 07/02/2023, 9:14 AM  Clinical Narrative:    Pt currently not medically stable for discharge, Per [facility] pt will not be able to transfer to their facility now until next week. TOC will continue to follow.   Expected Discharge Plan: Skilled Nursing Facility Barriers to Discharge: SNF Pending bed offer, Inadequate or no insurance  Expected Discharge Plan and Services     Post Acute Care Choice: Skilled Nursing Facility Living arrangements for the past 2 months: Assisted Living Facility [facility])                                       Social Determinants of Health (SDOH) Interventions SDOH Screenings   Food Insecurity: Patient Unable To Answer (06/15/2023)  Housing: Low Risk  (06/15/2023)  Transportation Needs: Patient Unable To Answer (06/15/2023)  Utilities: Patient Unable To Answer (06/15/2023)  Tobacco Use: Low Risk  (06/18/2023)    Readmission Risk Interventions    06/25/2023    2:17 PM  Readmission Risk Prevention Plan  Transportation Screening Complete  PCP or Specialist Appt within 5-7 Days Complete  Home Care Screening Complete  Medication Review (RN CM) Complete

## 2023-07-03 DIAGNOSIS — A419 Sepsis, unspecified organism: Secondary | ICD-10-CM | POA: Diagnosis not present

## 2023-07-03 DIAGNOSIS — R6520 Severe sepsis without septic shock: Secondary | ICD-10-CM | POA: Diagnosis not present

## 2023-07-03 LAB — COMPREHENSIVE METABOLIC PANEL
ALT: 24 U/L (ref 0–44)
AST: 30 U/L (ref 15–41)
Albumin: 2.6 g/dL — ABNORMAL LOW (ref 3.5–5.0)
Alkaline Phosphatase: 31 U/L — ABNORMAL LOW (ref 38–126)
Anion gap: 11 (ref 5–15)
BUN: 24 mg/dL — ABNORMAL HIGH (ref 8–23)
CO2: 17 mmol/L — ABNORMAL LOW (ref 22–32)
Calcium: 8.2 mg/dL — ABNORMAL LOW (ref 8.9–10.3)
Chloride: 108 mmol/L (ref 98–111)
Creatinine, Ser: 1.3 mg/dL — ABNORMAL HIGH (ref 0.61–1.24)
GFR, Estimated: 56 mL/min — ABNORMAL LOW (ref >60)
Glucose, Bld: 143 mg/dL — ABNORMAL HIGH (ref 70–99)
Potassium: 3.1 mmol/L — ABNORMAL LOW (ref 3.5–5.1)
Sodium: 136 mmol/L (ref 135–145)
Total Bilirubin: 0.7 mg/dL (ref 0.3–1.2)
Total Protein: 6.8 g/dL (ref 6.5–8.1)

## 2023-07-03 LAB — CBC WITH DIFFERENTIAL/PLATELET
Abs Immature Granulocytes: 0.02 10*3/uL (ref 0.00–0.07)
Basophils Absolute: 0 10*3/uL (ref 0.0–0.1)
Basophils Relative: 1 %
Eosinophils Absolute: 0.1 10*3/uL (ref 0.0–0.5)
Eosinophils Relative: 1 %
HCT: 37.5 % — ABNORMAL LOW (ref 39.0–52.0)
Hemoglobin: 11.9 g/dL — ABNORMAL LOW (ref 13.0–17.0)
Immature Granulocytes: 0 %
Lymphocytes Relative: 17 %
Lymphs Abs: 1.3 10*3/uL (ref 0.7–4.0)
MCH: 28.6 pg (ref 26.0–34.0)
MCHC: 31.7 g/dL (ref 30.0–36.0)
MCV: 90.1 fL (ref 80.0–100.0)
Monocytes Absolute: 1.1 10*3/uL — ABNORMAL HIGH (ref 0.1–1.0)
Monocytes Relative: 14 %
Neutro Abs: 5.3 10*3/uL (ref 1.7–7.7)
Neutrophils Relative %: 67 %
Platelets: 201 10*3/uL (ref 150–400)
RBC: 4.16 MIL/uL — ABNORMAL LOW (ref 4.22–5.81)
RDW: 14.6 % (ref 11.5–15.5)
WBC: 7.8 10*3/uL (ref 4.0–10.5)
nRBC: 0 % (ref 0.0–0.2)

## 2023-07-03 LAB — GLUCOSE, CAPILLARY
Glucose-Capillary: 143 mg/dL — ABNORMAL HIGH (ref 70–99)
Glucose-Capillary: 138 mg/dL — ABNORMAL HIGH (ref 70–99)
Glucose-Capillary: 182 mg/dL — ABNORMAL HIGH (ref 70–99)
Glucose-Capillary: 148 mg/dL — ABNORMAL HIGH (ref 70–99)

## 2023-07-03 MED ORDER — MAGIC MOUTHWASH
10.0000 mL | Freq: Four times a day (QID) | ORAL | Status: DC
  Administered 2023-07-03 – 2023-08-02 (×108): 10 mL via ORAL
  Filled 2023-07-03 (×121): qty 10

## 2023-07-03 MED ORDER — PANTOPRAZOLE SODIUM 40 MG PO TBEC
40.0000 mg | DELAYED_RELEASE_TABLET | Freq: Every day | ORAL | Status: DC
  Administered 2023-07-03 – 2023-07-05 (×3): 40 mg via ORAL
  Filled 2023-07-03 (×3): qty 1

## 2023-07-03 MED ORDER — INSULIN GLARGINE-YFGN 100 UNIT/ML ~~LOC~~ SOLN
15.0000 [IU] | Freq: Every day | SUBCUTANEOUS | Status: DC
  Administered 2023-07-03: 15 [IU] via SUBCUTANEOUS
  Filled 2023-07-03 (×3): qty 0.15

## 2023-07-03 MED ORDER — METOPROLOL SUCCINATE ER 25 MG PO TB24
25.0000 mg | ORAL_TABLET | Freq: Once | ORAL | Status: AC
  Administered 2023-07-03: 25 mg via ORAL
  Filled 2023-07-03: qty 1

## 2023-07-03 MED ORDER — POTASSIUM CHLORIDE 20 MEQ PO PACK
40.0000 meq | PACK | Freq: Once | ORAL | Status: AC
  Administered 2023-07-03: 40 meq via ORAL
  Filled 2023-07-03: qty 2

## 2023-07-03 MED ORDER — METOPROLOL SUCCINATE ER 50 MG PO TB24
100.0000 mg | ORAL_TABLET | Freq: Two times a day (BID) | ORAL | Status: DC
  Administered 2023-07-03 – 2023-07-05 (×4): 100 mg via ORAL
  Filled 2023-07-03 (×4): qty 2

## 2023-07-03 MED ORDER — DRONABINOL 2.5 MG PO CAPS
2.5000 mg | ORAL_CAPSULE | Freq: Two times a day (BID) | ORAL | Status: DC
  Administered 2023-07-03 – 2023-07-05 (×4): 2.5 mg via ORAL
  Filled 2023-07-03 (×4): qty 1

## 2023-07-03 NOTE — Plan of Care (Signed)
No acute events this shift. The patient has rested comfortably throughout the shift. He continues to have poor appetite and has eaten very small portions of his meals. He has been turned throughout the shift. His HR was elevated and he was given metoprolol orally. VS have otherwise remained stable. Fall precautions in place. Will continue to monitor.  Problem: Education: Goal: Ability to describe self-care measures that may prevent or decrease complications (Diabetes Survival Skills Education) will improve Outcome: Progressing Goal: Individualized Educational Video(s) Outcome: Progressing   Problem: Coping: Goal: Ability to adjust to condition or change in health will improve Outcome: Progressing   Problem: Fluid Volume: Goal: Ability to maintain a balanced intake and output will improve Outcome: Progressing   Problem: Health Behavior/Discharge Planning: Goal: Ability to identify and utilize available resources and services will improve Outcome: Progressing Goal: Ability to manage health-related needs will improve Outcome: Progressing   Problem: Metabolic: Goal: Ability to maintain appropriate glucose levels will improve Outcome: Progressing   Problem: Nutritional: Goal: Maintenance of adequate nutrition will improve Outcome: Progressing Goal: Progress toward achieving an optimal weight will improve Outcome: Progressing   Problem: Skin Integrity: Goal: Risk for impaired skin integrity will decrease Outcome: Progressing   Problem: Tissue Perfusion: Goal: Adequacy of tissue perfusion will improve Outcome: Progressing   Problem: Education: Goal: Knowledge of General Education information will improve Description: Including pain rating scale, medication(s)/side effects and non-pharmacologic comfort measures Outcome: Progressing   Problem: Health Behavior/Discharge Planning: Goal: Ability to manage health-related needs will improve Outcome: Progressing   Problem: Clinical  Measurements: Goal: Ability to maintain clinical measurements within normal limits will improve Outcome: Progressing Goal: Will remain free from infection Outcome: Progressing Goal: Diagnostic test results will improve Outcome: Progressing Goal: Respiratory complications will improve Outcome: Progressing Goal: Cardiovascular complication will be avoided Outcome: Progressing   Problem: Activity: Goal: Risk for activity intolerance will decrease Outcome: Progressing   Problem: Nutrition: Goal: Adequate nutrition will be maintained Outcome: Progressing   Problem: Coping: Goal: Level of anxiety will decrease Outcome: Progressing   Problem: Elimination: Goal: Will not experience complications related to bowel motility Outcome: Progressing Goal: Will not experience complications related to urinary retention Outcome: Progressing   Problem: Pain Managment: Goal: General experience of comfort will improve Outcome: Progressing   Problem: Safety: Goal: Ability to remain free from injury will improve Outcome: Progressing   Problem: Skin Integrity: Goal: Risk for impaired skin integrity will decrease Outcome: Progressing   Problem: Fluid Volume: Goal: Hemodynamic stability will improve Outcome: Progressing   Problem: Clinical Measurements: Goal: Diagnostic test results will improve Outcome: Progressing Goal: Signs and symptoms of infection will decrease Outcome: Progressing   Problem: Respiratory: Goal: Ability to maintain adequate ventilation will improve Outcome: Progressing   Problem: Education: Goal: Ability to describe self-care measures that may prevent or decrease complications (Diabetes Survival Skills Education) will improve Outcome: Progressing Goal: Individualized Educational Video(s) Outcome: Progressing   Problem: Cardiac: Goal: Ability to maintain an adequate cardiac output will improve Outcome: Progressing   Problem: Health Behavior/Discharge  Planning: Goal: Ability to identify and utilize available resources and services will improve Outcome: Progressing Goal: Ability to manage health-related needs will improve Outcome: Progressing   Problem: Fluid Volume: Goal: Ability to achieve a balanced intake and output will improve Outcome: Progressing   Problem: Metabolic: Goal: Ability to maintain appropriate glucose levels will improve Outcome: Progressing   Problem: Nutritional: Goal: Maintenance of adequate nutrition will improve Outcome: Progressing Goal: Maintenance of adequate weight for body size and  type will improve Outcome: Progressing   Problem: Respiratory: Goal: Will regain and/or maintain adequate ventilation Outcome: Progressing   Problem: Urinary Elimination: Goal: Ability to achieve and maintain adequate renal perfusion and functioning will improve Outcome: Progressing   Problem: Education: Goal: Knowledge of disease or condition will improve Outcome: Progressing Goal: Knowledge of secondary prevention will improve (MUST DOCUMENT ALL) Outcome: Progressing Goal: Knowledge of patient specific risk factors will improve Loraine Leriche N/A or DELETE if not current risk factor) Outcome: Progressing   Problem: Ischemic Stroke/TIA Tissue Perfusion: Goal: Complications of ischemic stroke/TIA will be minimized Outcome: Progressing

## 2023-07-03 NOTE — Progress Notes (Signed)
[facility] Progress Note Patient: Eric Zamora DOB: 01-19-1946 DOA: 06/15/2023  DOS: the patient was seen and examined on 07/03/2023  Brief hospital course: Mr. Rise is a 77 y.o. 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 10/15 admitted to stepdown unit. 10/17: Overnight with fever, aspiration event, new AG acidosis, transferred back to SDU for BiPAP and insulin drip 10/18: taken to OR for drainage of abscess 10/23 cardiology was consulted for new onset A-fib with RVR. 10/24 nephrology was consulted for AKI and hyponatremia.  CT head was obtained for ongoing AMS and was found to have acute/subacute right MCA territory infarct.  Neurology was also consulted. 10/25.  Palliative care was consulted.  Assessment and Plan: Severe sepsis due to perirectal abscess present on admission. On oral antibiotic now. Underwent I and D on 10/18.  Penrose drain was placed and later on removed after a few days. No cultures were sent. Started on IV vancomycin, Flagyl, ceftriaxone, later on was switched to cefepime and has been on Augmentin and doxycycline.  Total antibiotic duration from 10/15 to 11/2.  2 weeks from initial I&D.  Will discontinue antibiotic at present. Monitor   New onset A-fib with RVR. HOCM. Cardiology was consulted. Heart rate remains elevated.  Room and blood pressure to go up on Lopressor. Will continue Toprol-XL.  Increase to 100 mg twice daily.   Acute metabolic encephalopathy. History of dementia. Acute right MCA stroke. Progressively worsening mentation and early hospital stay. Neurology was consulted. 10/24 CT head showed evidence of acute/subacute cortical and subcortical right MCA territory infarct. MRI brain confirmed the infarct. As per neurology recommendation a CT scan was repeated  on 10/31.  Given that there was no hemorrhagic conversion neurology recommended to resume anticoagulation with Eliquis. PT OT recommended SNF. SLP following.  On dysphagia 1 diet.   History of seizures. Depakote on hold. On Keppra. EEG unremarkable. Monitor.   Moderate MR. Monitor volume status.  Dysphagia.  Due to stroke. Speech therapy following. Underwent MBS. Currently on dysphagia 1 diet.  Possible thrush. Possible pill induced gastritis. Antibiotic discontinued. Will be on oral fluconazole.  Until 11/7. Already has completed therapy with nystatin.  History of syphilis. RPR reactive for 2 years. Per ID patient currently being treated with Bicillin on a weekly basis. Next dose 11/8.  Last dose.   AKI on CKD 3B. Baseline around 1.3. Worsened to 1.99. Improved significantly to normal values and now back to 1.3. For now monitoring.   Hypernatremia. Likely from poor p.o. intake.  Now resolved.   CAD Patient not on any DAPT or antiplatelet therapy secondary to being on Eliquis.   Bipolar disorder. Mentation stable.  Not on any medication.  Monitor.   Type II DM, uncontrolled with hyperglycemia. Currently on sliding scale insulin.  Poor p.o. intake. Patient has had poor p.o. intake for last few days. No abdominal symptoms reported by the patient. No further diarrhea seen as well. No nausea no vomiting. At present we will initiate Marinol and monitor for improvement in appetite.  Hypokalemia. Corrected.  Metabolic acidosis. Unclear etiology but possibly related to renal function. Will monitor for now.  Anion gap normal.   Subjective: Does not want to eat.  No nausea no vomiting.  No diarrhea reported.  No abdominal pain.  No chest pain.  Physical  Exam: General: in Mild distress, No Rash Cardiovascular: S1 and S2 Present, No Murmur Respiratory: Good respiratory effort, Bilateral Air entry present. No Crackles, No wheezes Abdomen: Bowel Sound present, No  tenderness Extremities: No edema Neuro: Alert and oriented x3, no new focal deficit dysarthria and left-sided weakness seen.  Data Reviewed: I have Reviewed nursing notes, Vitals, and Lab results. Since last encounter, pertinent lab results CBC and BMP   . I have ordered test including CBC and BMP  .   Disposition: Status is: Inpatient Remains inpatient appropriate because: Need for improvement in oral intake and stabilization of renal function.  SCDs Start: 06/15/23 1459 apixaban (ELIQUIS) tablet 5 mg   Family Communication: No one at bedside.  Unable to reach the legal guardian to update them.  Will call again on Monday. Level of care: Telemetry continue for now due to RVR. Vitals:   07/03/23 0402 07/03/23 0826 07/03/23 0944 07/03/23 1227  BP: 117/85 113/83 107/65 (!) 144/82  Pulse: 97 80 97 68  Resp: 16  17 18   Temp: 98.2 F (36.8 C) 98.5 F (36.9 C) 98.8 F (37.1 C) 97.6 F (36.4 C)  TempSrc:  Oral    SpO2: 100% 98% 100% 100%  Weight: 80.7 kg     Height:         Author: Lynden Oxford, MD 07/03/2023 6:33 PM  Please look on [email or web address] to find out who is on call.

## 2023-07-03 NOTE — Plan of Care (Signed)
Problem: Education: Goal: Ability to describe self-care measures that may prevent or decrease complications (Diabetes Survival Skills Education) will improve Outcome: Progressing Goal: Individualized Educational Video(s) Outcome: Progressing   Problem: Coping: Goal: Ability to adjust to condition or change in health will improve Outcome: Progressing   Problem: Fluid Volume: Goal: Ability to maintain a balanced intake and output will improve Outcome: Progressing   Problem: Health Behavior/Discharge Planning: Goal: Ability to identify and utilize available resources and services will improve Outcome: Progressing Goal: Ability to manage health-related needs will improve Outcome: Progressing   Problem: Metabolic: Goal: Ability to maintain appropriate glucose levels will improve Outcome: Progressing   Problem: Nutritional: Goal: Maintenance of adequate nutrition will improve Outcome: Progressing Goal: Progress toward achieving an optimal weight will improve Outcome: Progressing   Problem: Skin Integrity: Goal: Risk for impaired skin integrity will decrease Outcome: Progressing   Problem: Tissue Perfusion: Goal: Adequacy of tissue perfusion will improve Outcome: Progressing   Problem: Education: Goal: Knowledge of General Education information will improve Description: Including pain rating scale, medication(s)/side effects and non-pharmacologic comfort measures Outcome: Progressing   Problem: Health Behavior/Discharge Planning: Goal: Ability to manage health-related needs will improve Outcome: Progressing   Problem: Clinical Measurements: Goal: Ability to maintain clinical measurements within normal limits will improve Outcome: Progressing Goal: Will remain free from infection Outcome: Progressing Goal: Diagnostic test results will improve Outcome: Progressing Goal: Respiratory complications will improve Outcome: Progressing Goal: Cardiovascular complication will  be avoided Outcome: Progressing   Problem: Activity: Goal: Risk for activity intolerance will decrease Outcome: Progressing   Problem: Nutrition: Goal: Adequate nutrition will be maintained Outcome: Progressing   Problem: Coping: Goal: Level of anxiety will decrease Outcome: Progressing   Problem: Elimination: Goal: Will not experience complications related to bowel motility Outcome: Progressing Goal: Will not experience complications related to urinary retention Outcome: Progressing   Problem: Pain Managment: Goal: General experience of comfort will improve Outcome: Progressing   Problem: Safety: Goal: Ability to remain free from injury will improve Outcome: Progressing   Problem: Skin Integrity: Goal: Risk for impaired skin integrity will decrease Outcome: Progressing   Problem: Fluid Volume: Goal: Hemodynamic stability will improve Outcome: Progressing   Problem: Clinical Measurements: Goal: Diagnostic test results will improve Outcome: Progressing Goal: Signs and symptoms of infection will decrease Outcome: Progressing   Problem: Respiratory: Goal: Ability to maintain adequate ventilation will improve Outcome: Progressing   Problem: Education: Goal: Ability to describe self-care measures that may prevent or decrease complications (Diabetes Survival Skills Education) will improve Outcome: Progressing Goal: Individualized Educational Video(s) Outcome: Progressing   Problem: Cardiac: Goal: Ability to maintain an adequate cardiac output will improve Outcome: Progressing   Problem: Health Behavior/Discharge Planning: Goal: Ability to identify and utilize available resources and services will improve Outcome: Progressing Goal: Ability to manage health-related needs will improve Outcome: Progressing   Problem: Fluid Volume: Goal: Ability to achieve a balanced intake and output will improve Outcome: Progressing   Problem: Metabolic: Goal: Ability to  maintain appropriate glucose levels will improve Outcome: Progressing   Problem: Nutritional: Goal: Maintenance of adequate nutrition will improve Outcome: Progressing Goal: Maintenance of adequate weight for body size and type will improve Outcome: Progressing   Problem: Respiratory: Goal: Will regain and/or maintain adequate ventilation Outcome: Progressing   Problem: Urinary Elimination: Goal: Ability to achieve and maintain adequate renal perfusion and functioning will improve Outcome: Progressing   Problem: Education: Goal: Knowledge of disease or condition will improve Outcome: Progressing Goal: Knowledge of secondary prevention will improve (  MUST DOCUMENT ALL) Outcome: Progressing Goal: Knowledge of patient specific risk factors will improve Loraine Leriche N/A or DELETE if not current risk factor) Outcome: Progressing   Problem: Ischemic Stroke/TIA Tissue Perfusion: Goal: Complications of ischemic stroke/TIA will be minimized Outcome: Progressing

## 2023-07-04 DIAGNOSIS — R6520 Severe sepsis without septic shock: Secondary | ICD-10-CM | POA: Diagnosis not present

## 2023-07-04 DIAGNOSIS — A419 Sepsis, unspecified organism: Secondary | ICD-10-CM | POA: Diagnosis not present

## 2023-07-04 LAB — GLUCOSE, CAPILLARY
Glucose-Capillary: 153 mg/dL — ABNORMAL HIGH (ref 70–99)
Glucose-Capillary: 130 mg/dL — ABNORMAL HIGH (ref 70–99)
Glucose-Capillary: 145 mg/dL — ABNORMAL HIGH (ref 70–99)
Glucose-Capillary: 115 mg/dL — ABNORMAL HIGH (ref 70–99)

## 2023-07-04 LAB — BASIC METABOLIC PANEL
Anion gap: 11 (ref 5–15)
Anion gap: 8 (ref 5–15)
BUN: 27 mg/dL — ABNORMAL HIGH (ref 8–23)
BUN: 30 mg/dL — ABNORMAL HIGH (ref 8–23)
CO2: 19 mmol/L — ABNORMAL LOW (ref 22–32)
CO2: 19 mmol/L — ABNORMAL LOW (ref 22–32)
Calcium: 8.8 mg/dL — ABNORMAL LOW (ref 8.9–10.3)
Calcium: 8.8 mg/dL — ABNORMAL LOW (ref 8.9–10.3)
Chloride: 114 mmol/L — ABNORMAL HIGH (ref 98–111)
Chloride: 114 mmol/L — ABNORMAL HIGH (ref 98–111)
Creatinine, Ser: 1.27 mg/dL — ABNORMAL HIGH (ref 0.61–1.24)
Creatinine, Ser: 1.44 mg/dL — ABNORMAL HIGH (ref 0.61–1.24)
GFR, Estimated: 58 mL/min — ABNORMAL LOW (ref >60)
GFR, Estimated: 50 mL/min — ABNORMAL LOW (ref >60)
Glucose, Bld: 158 mg/dL — ABNORMAL HIGH (ref 70–99)
Glucose, Bld: 159 mg/dL — ABNORMAL HIGH (ref 70–99)
Potassium: 2.7 mmol/L — CL (ref 3.5–5.1)
Potassium: 2.8 mmol/L — ABNORMAL LOW (ref 3.5–5.1)
Sodium: 144 mmol/L (ref 135–145)
Sodium: 141 mmol/L (ref 135–145)

## 2023-07-04 LAB — MAGNESIUM: Magnesium: 2.5 mg/dL — ABNORMAL HIGH (ref 1.7–2.4)

## 2023-07-04 MED ORDER — POTASSIUM CHLORIDE CRYS ER 20 MEQ PO TBCR
40.0000 meq | EXTENDED_RELEASE_TABLET | ORAL | Status: DC
Start: 2023-07-04 — End: 2023-07-04

## 2023-07-04 MED ORDER — POTASSIUM CHLORIDE 20 MEQ PO PACK
80.0000 meq | PACK | Freq: Once | ORAL | Status: AC
  Administered 2023-07-04: 80 meq via ORAL
  Filled 2023-07-04: qty 4

## 2023-07-04 MED ORDER — ENSURE ENLIVE PO LIQD
237.0000 mL | Freq: Four times a day (QID) | ORAL | Status: DC
Start: 2023-07-04 — End: 2023-07-05
  Administered 2023-07-04 (×2): 237 mL via ORAL

## 2023-07-04 MED ORDER — METOPROLOL SUCCINATE ER 50 MG PO TB24
50.0000 mg | ORAL_TABLET | Freq: Every day | ORAL | Status: DC
  Administered 2023-07-04: 50 mg via ORAL
  Filled 2023-07-04: qty 1

## 2023-07-04 NOTE — Plan of Care (Signed)
Problem: Education: Goal: Ability to describe self-care measures that may prevent or decrease complications (Diabetes Survival Skills Education) will improve Outcome: Progressing Goal: Individualized Educational Video(s) Outcome: Progressing   Problem: Coping: Goal: Ability to adjust to condition or change in health will improve Outcome: Progressing   Problem: Fluid Volume: Goal: Ability to maintain a balanced intake and output will improve Outcome: Progressing   Problem: Health Behavior/Discharge Planning: Goal: Ability to identify and utilize available resources and services will improve Outcome: Progressing Goal: Ability to manage health-related needs will improve Outcome: Progressing   Problem: Metabolic: Goal: Ability to maintain appropriate glucose levels will improve Outcome: Progressing   Problem: Nutritional: Goal: Maintenance of adequate nutrition will improve Outcome: Progressing Goal: Progress toward achieving an optimal weight will improve Outcome: Progressing   Problem: Skin Integrity: Goal: Risk for impaired skin integrity will decrease Outcome: Progressing   Problem: Tissue Perfusion: Goal: Adequacy of tissue perfusion will improve Outcome: Progressing   Problem: Education: Goal: Knowledge of General Education information will improve Description: Including pain rating scale, medication(s)/side effects and non-pharmacologic comfort measures Outcome: Progressing   Problem: Health Behavior/Discharge Planning: Goal: Ability to manage health-related needs will improve Outcome: Progressing   Problem: Clinical Measurements: Goal: Ability to maintain clinical measurements within normal limits will improve Outcome: Progressing Goal: Will remain free from infection Outcome: Progressing Goal: Diagnostic test results will improve Outcome: Progressing Goal: Respiratory complications will improve Outcome: Progressing Goal: Cardiovascular complication will  be avoided Outcome: Progressing   Problem: Activity: Goal: Risk for activity intolerance will decrease Outcome: Progressing   Problem: Nutrition: Goal: Adequate nutrition will be maintained Outcome: Progressing   Problem: Coping: Goal: Level of anxiety will decrease Outcome: Progressing   Problem: Elimination: Goal: Will not experience complications related to bowel motility Outcome: Progressing Goal: Will not experience complications related to urinary retention Outcome: Progressing   Problem: Pain Managment: Goal: General experience of comfort will improve Outcome: Progressing   Problem: Safety: Goal: Ability to remain free from injury will improve Outcome: Progressing   Problem: Skin Integrity: Goal: Risk for impaired skin integrity will decrease Outcome: Progressing   Problem: Fluid Volume: Goal: Hemodynamic stability will improve Outcome: Progressing   Problem: Clinical Measurements: Goal: Diagnostic test results will improve Outcome: Progressing Goal: Signs and symptoms of infection will decrease Outcome: Progressing   Problem: Respiratory: Goal: Ability to maintain adequate ventilation will improve Outcome: Progressing   Problem: Education: Goal: Ability to describe self-care measures that may prevent or decrease complications (Diabetes Survival Skills Education) will improve Outcome: Progressing Goal: Individualized Educational Video(s) Outcome: Progressing   Problem: Cardiac: Goal: Ability to maintain an adequate cardiac output will improve Outcome: Progressing   Problem: Health Behavior/Discharge Planning: Goal: Ability to identify and utilize available resources and services will improve Outcome: Progressing Goal: Ability to manage health-related needs will improve Outcome: Progressing   Problem: Fluid Volume: Goal: Ability to achieve a balanced intake and output will improve Outcome: Progressing   Problem: Metabolic: Goal: Ability to  maintain appropriate glucose levels will improve Outcome: Progressing   Problem: Nutritional: Goal: Maintenance of adequate nutrition will improve Outcome: Progressing Goal: Maintenance of adequate weight for body size and type will improve Outcome: Progressing   Problem: Respiratory: Goal: Will regain and/or maintain adequate ventilation Outcome: Progressing   Problem: Urinary Elimination: Goal: Ability to achieve and maintain adequate renal perfusion and functioning will improve Outcome: Progressing   Problem: Education: Goal: Knowledge of disease or condition will improve Outcome: Progressing Goal: Knowledge of secondary prevention will improve (  MUST DOCUMENT ALL) Outcome: Progressing Goal: Knowledge of patient specific risk factors will improve Loraine Leriche N/A or DELETE if not current risk factor) Outcome: Progressing   Problem: Ischemic Stroke/TIA Tissue Perfusion: Goal: Complications of ischemic stroke/TIA will be minimized Outcome: Progressing

## 2023-07-04 NOTE — Plan of Care (Signed)
  Problem: Metabolic: Goal: Ability to maintain appropriate glucose levels will improve Outcome: Progressing   Problem: Skin Integrity: Goal: Risk for impaired skin integrity will decrease Outcome: Progressing   Problem: Education: Goal: Knowledge of General Education information will improve Description: Including pain rating scale, medication(s)/side effects and non-pharmacologic comfort measures Outcome: Progressing   Problem: Health Behavior/Discharge Planning: Goal: Ability to manage health-related needs will improve Outcome: Progressing   Problem: Nutritional: Goal: Maintenance of adequate nutrition will improve Outcome: Not Progressing Goal: Progress toward achieving an optimal weight will improve Outcome: Not Progressing   Problem: Nutrition: Goal: Adequate nutrition will be maintained Outcome: Not Progressing

## 2023-07-04 NOTE — Progress Notes (Signed)
[facility] Progress Note Patient: Eric Zamora PJA:[id number] DOB: [birth year] DOA: 06/15/2023  DOS: the patient was seen and examined on 07/04/2023  Brief hospital course: Mr. Sayegh is a 77 y.o. 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 10/15 admitted to stepdown unit. 10/17: Overnight with fever, aspiration event, new AG acidosis, transferred back to SDU for BiPAP and insulin drip 10/18: taken to OR for drainage of abscess 10/23 cardiology was consulted for new onset A-fib with RVR. 10/24 nephrology was consulted for AKI and hyponatremia.  CT head was obtained for ongoing AMS and was found to have acute/subacute right MCA territory infarct.  Neurology was also consulted. 10/25.  Palliative care was consulted. 10/31.  Anticoagulation started again. 11/1.  Second dose of Bicillin given.  Underwent MBS. 11/2.  Marinol started for poor p.o. intake.  Assessment and Plan: Severe sepsis due to perirectal abscess present on admission. On oral antibiotic now. Underwent I and D on 10/18.  Penrose drain was placed and later on removed after a few days. No cultures were sent. Started on IV vancomycin, Flagyl, ceftriaxone, later on was switched to cefepime and has been on Augmentin and doxycycline.  Total antibiotic duration from 10/15 to 11/2.  2 weeks from initial I&D.  Will discontinue antibiotic at present. Monitor   New onset A-fib with RVR. HOCM. Cardiology was consulted.  Was on 100 4 times daily before for metoprolol. Heart rate remains elevated.  Will continue Toprol-XL.  Currently on 100 - 50 - 100 mg regimen.   Acute metabolic encephalopathy. History of dementia. Acute right MCA stroke. Progressively worsening mentation and early hospital stay. Neurology was consulted. 10/24 CT head showed evidence of  acute/subacute cortical and subcortical right MCA territory infarct. MRI brain confirmed the infarct. As per neurology recommendation a CT scan was repeated on 10/31.  Given that there was no hemorrhagic conversion neurology recommended to resume anticoagulation with Eliquis. PT OT recommended SNF. SLP following.  On dysphagia 1 diet.   History of seizures. Depakote on hold. On Keppra. EEG unremarkable. Monitor.   Moderate MR. Monitor volume status.  Dysphagia.  Due to stroke. Speech therapy following. Underwent MBS. Currently on dysphagia 1 diet.  Possible thrush. Possible pill induced gastritis. Antibiotic discontinued. Will be on oral fluconazole.  Until 11/7. Already has completed therapy with nystatin.  History of syphilis. RPR reactive for 2 years. Per ID patient currently being treated with Bicillin on a weekly basis. Next dose 11/8.  Last dose.   AKI on CKD 3B. Baseline around 1.3. Worsened to 1.99. Improved significantly to normal values and now stable at 1.3. For now monitoring.   Hypernatremia. Likely from poor p.o. intake.  Now resolved.   CAD Patient not on any DAPT or antiplatelet therapy secondary to being on Eliquis.   Bipolar disorder. Mentation stable.  Not on any medication.  Monitor.   Type II DM, uncontrolled with hyperglycemia. Currently on sliding scale insulin.  Poor p.o. intake. Patient has had poor p.o. intake for last few days. No abdominal symptoms reported by the patient. No further diarrhea seen as well. No nausea no vomiting. At present we will initiate Marinol and monitor for improvement in appetite.  Hypokalemia. Corrected.  Potassium 2.7.  Severe.  Will replace. Magnesium normal.  Metabolic acidosis. Unclear etiology but possibly related to  renal function. Will monitor for now.  Anion gap normal.   Subjective: No nausea no vomiting no fever no chills.  RN reports 1 loose BM.  No abdominal pain reported by the  patient.  Physical Exam: General: in Mild distress, No Rash Cardiovascular: S1 and S2 Present, No Murmur Respiratory: Good respiratory effort, Bilateral Air entry present. No Crackles, No wheezes Abdomen: Bowel Sound present, No tenderness Extremities: Chronic left upper and bilateral lower extremity trace edema Neuro: Alert and oriented x3, no new focal deficit, left-sided weakness.  Data Reviewed: I have Reviewed nursing notes, Vitals, and Lab results. Since last encounter, pertinent lab results CBC and CMP   . I have ordered test including CBC and CMP  .   Disposition: Status is: Inpatient Remains inpatient appropriate because: Improvement in oral intake.  Need clarity on goals of care as well.  Need rate control as well.  SCDs Start: 06/15/23 1459 apixaban (ELIQUIS) tablet 5 mg   Family Communication: No one at bedside Level of care: Telemetry continue due to tachycardia. Vitals:   07/04/23 0141 07/04/23 0421 07/04/23 1012 07/04/23 1241  BP:  131/73 (!) 148/96 (!) 148/87  Pulse:  (!) 57 93 (!) 107  Resp:  18  16  Temp:  (!) 97.4 F (36.3 C)  97.6 F (36.4 C)  TempSrc:  Oral  Oral  SpO2:  100%  100%  Weight: 80.7 kg     Height:         Author: Lynden Oxford, MD 07/04/2023 2:55 PM  Please look on [email or web address] to find out who is on call.

## 2023-07-04 NOTE — Progress Notes (Signed)
PHARMACY - ANTICOAGULATION CONSULT NOTE  Pharmacy Consult for Eliquis Indication: atrial fibrillation  No Known Allergies  Patient Measurements: Height: 5\' 5"  (165.1 cm) Weight: 80.7 kg (177 lb 14.6 oz) IBW/kg (Calculated) : 61.5  Vital Signs: Temp: 97.4 F (36.3 C) (11/03 0421) Temp Source: Oral (11/03 0421) BP: 131/73 (11/03 0421) Pulse Rate: 57 (11/03 0421)  Labs: Recent Labs    07/02/23 0539 07/03/23 0615  HGB 12.3* 11.9*  HCT 38.5* 37.5*  PLT 184 201  CREATININE 1.20 1.30*    Estimated Creatinine Clearance: 45.8 mL/min (A) (by C-G formula based on SCr of 1.3 mg/dL (H)).   Medical History: Past Medical History:  Diagnosis Date   Alzheimer disease (HCC)    Bipolar affective disorder (HCC)    Chronic renal insufficiency    Dementia (HCC)    Dyslipidemia    HTN (hypertension)    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   gradient on TEE   Syncopal episodes      Assessment: Patient is a 77 y.o M known to pharmacy from heparin consult for afib this admission. Anticoag. changed to Eliquis on 10/16, Eliquis d/ced from 10/17 - 10/18, and resumed back from 10/29 -10/24. Neurologist d/ced Eliquis on 10/24 due to acute stroke  and petechial hemorrhage noted on  head MRI.   Pharmacy consulted on 10/31 to resume Eliquis back.    Plan:  - Continue Eliquis 5 mg bid  - With no dose adjustments anticipated, Pharmacy will sign off consult and continue to monitor CBC as ordered by provider and signs/symptoms of bleeding   Thank you for allowing pharmacy to be a part of this patient's care.  Selinda Eon, PharmD, BCPS Clinical Pharmacist [facility] 07/04/2023 8:54 AM

## 2023-07-05 ENCOUNTER — Inpatient Hospital Stay (HOSPITAL_COMMUNITY): Payer: Medicare (Managed Care)

## 2023-07-05 ENCOUNTER — Encounter (HOSPITAL_COMMUNITY): Payer: Self-pay | Admitting: Internal Medicine

## 2023-07-05 DIAGNOSIS — A419 Sepsis, unspecified organism: Secondary | ICD-10-CM | POA: Diagnosis not present

## 2023-07-05 DIAGNOSIS — R6520 Severe sepsis without septic shock: Secondary | ICD-10-CM | POA: Diagnosis not present

## 2023-07-05 LAB — CBC
HCT: 42.7 % (ref 39.0–52.0)
Hemoglobin: 13.4 g/dL (ref 13.0–17.0)
MCH: 28.9 pg (ref 26.0–34.0)
MCHC: 31.4 g/dL (ref 30.0–36.0)
MCV: 92.2 fL (ref 80.0–100.0)
Platelets: 282 10*3/uL (ref 150–400)
RBC: 4.63 MIL/uL (ref 4.22–5.81)
RDW: 15 % (ref 11.5–15.5)
WBC: 8.7 10*3/uL (ref 4.0–10.5)
nRBC: 0 % (ref 0.0–0.2)

## 2023-07-05 LAB — BASIC METABOLIC PANEL
Anion gap: 11 (ref 5–15)
Anion gap: 7 (ref 5–15)
Anion gap: 7 (ref 5–15)
Anion gap: 9 (ref 5–15)
BUN: 30 mg/dL — ABNORMAL HIGH (ref 8–23)
BUN: 31 mg/dL — ABNORMAL HIGH (ref 8–23)
BUN: 29 mg/dL — ABNORMAL HIGH (ref 8–23)
BUN: 30 mg/dL — ABNORMAL HIGH (ref 8–23)
CO2: 18 mmol/L — ABNORMAL LOW (ref 22–32)
CO2: 18 mmol/L — ABNORMAL LOW (ref 22–32)
CO2: 16 mmol/L — ABNORMAL LOW (ref 22–32)
CO2: 16 mmol/L — ABNORMAL LOW (ref 22–32)
Calcium: 9 mg/dL (ref 8.9–10.3)
Calcium: 8.7 mg/dL — ABNORMAL LOW (ref 8.9–10.3)
Calcium: 8.7 mg/dL — ABNORMAL LOW (ref 8.9–10.3)
Calcium: 8.8 mg/dL — ABNORMAL LOW (ref 8.9–10.3)
Chloride: 116 mmol/L — ABNORMAL HIGH (ref 98–111)
Chloride: 125 mmol/L — ABNORMAL HIGH (ref 98–111)
Chloride: 126 mmol/L — ABNORMAL HIGH (ref 98–111)
Chloride: 124 mmol/L — ABNORMAL HIGH (ref 98–111)
Creatinine, Ser: 1.35 mg/dL — ABNORMAL HIGH (ref 0.61–1.24)
Creatinine, Ser: 1.48 mg/dL — ABNORMAL HIGH (ref 0.61–1.24)
Creatinine, Ser: 1.49 mg/dL — ABNORMAL HIGH (ref 0.61–1.24)
Creatinine, Ser: 1.46 mg/dL — ABNORMAL HIGH (ref 0.61–1.24)
GFR, Estimated: 54 mL/min — ABNORMAL LOW (ref >60)
GFR, Estimated: 48 mL/min — ABNORMAL LOW (ref >60)
GFR, Estimated: 48 mL/min — ABNORMAL LOW (ref >60)
GFR, Estimated: 49 mL/min — ABNORMAL LOW (ref >60)
Glucose, Bld: 203 mg/dL — ABNORMAL HIGH (ref 70–99)
Glucose, Bld: 126 mg/dL — ABNORMAL HIGH (ref 70–99)
Glucose, Bld: 130 mg/dL — ABNORMAL HIGH (ref 70–99)
Glucose, Bld: 122 mg/dL — ABNORMAL HIGH (ref 70–99)
Potassium: 2.9 mmol/L — ABNORMAL LOW (ref 3.5–5.1)
Potassium: 3.7 mmol/L (ref 3.5–5.1)
Potassium: 4.8 mmol/L (ref 3.5–5.1)
Potassium: 3.1 mmol/L — ABNORMAL LOW (ref 3.5–5.1)
Sodium: 145 mmol/L (ref 135–145)
Sodium: 150 mmol/L — ABNORMAL HIGH (ref 135–145)
Sodium: 149 mmol/L — ABNORMAL HIGH (ref 135–145)
Sodium: 149 mmol/L — ABNORMAL HIGH (ref 135–145)

## 2023-07-05 LAB — GLUCOSE, CAPILLARY
Glucose-Capillary: 106 mg/dL — ABNORMAL HIGH (ref 70–99)
Glucose-Capillary: 110 mg/dL — ABNORMAL HIGH (ref 70–99)
Glucose-Capillary: 114 mg/dL — ABNORMAL HIGH (ref 70–99)
Glucose-Capillary: 121 mg/dL — ABNORMAL HIGH (ref 70–99)
Glucose-Capillary: 146 mg/dL — ABNORMAL HIGH (ref 70–99)
Glucose-Capillary: 159 mg/dL — ABNORMAL HIGH (ref 70–99)

## 2023-07-05 LAB — MAGNESIUM: Magnesium: 2.6 mg/dL — ABNORMAL HIGH (ref 1.7–2.4)

## 2023-07-05 MED ORDER — PANTOPRAZOLE SODIUM 40 MG IV SOLR
40.0000 mg | INTRAVENOUS | Status: DC
  Administered 2023-07-06 – 2023-07-15 (×10): 40 mg via INTRAVENOUS
  Filled 2023-07-05 (×11): qty 10

## 2023-07-05 MED ORDER — METOPROLOL TARTRATE 5 MG/5ML IV SOLN
5.0000 mg | Freq: Four times a day (QID) | INTRAVENOUS | Status: DC
  Administered 2023-07-05 – 2023-07-16 (×44): 5 mg via INTRAVENOUS
  Filled 2023-07-05 (×45): qty 5

## 2023-07-05 MED ORDER — HEPARIN (PORCINE) 25000 UT/250ML-% IV SOLN
1350.0000 [IU]/h | INTRAVENOUS | Status: DC
  Administered 2023-07-05: 1200 [IU]/h via INTRAVENOUS
  Administered 2023-07-06: 1350 [IU]/h via INTRAVENOUS
  Filled 2023-07-05 (×2): qty 250

## 2023-07-05 MED ORDER — FLUCONAZOLE IN SODIUM CHLORIDE 200-0.9 MG/100ML-% IV SOLN
200.0000 mg | INTRAVENOUS | Status: AC
  Administered 2023-07-05 – 2023-07-08 (×4): 200 mg via INTRAVENOUS
  Filled 2023-07-05 (×4): qty 100

## 2023-07-05 MED ORDER — INSULIN ASPART 100 UNIT/ML IJ SOLN
0.0000 [IU] | INTRAMUSCULAR | Status: DC
  Administered 2023-07-05 – 2023-07-07 (×4): 2 [IU] via SUBCUTANEOUS
  Administered 2023-07-07: 3 [IU] via SUBCUTANEOUS
  Administered 2023-07-07 – 2023-07-09 (×9): 2 [IU] via SUBCUTANEOUS
  Administered 2023-07-10: 3 [IU] via SUBCUTANEOUS
  Administered 2023-07-10: 2 [IU] via SUBCUTANEOUS
  Administered 2023-07-10: 3 [IU] via SUBCUTANEOUS
  Administered 2023-07-11 (×2): 2 [IU] via SUBCUTANEOUS
  Administered 2023-07-11: 3 [IU] via SUBCUTANEOUS
  Administered 2023-07-12 – 2023-07-15 (×5): 2 [IU] via SUBCUTANEOUS

## 2023-07-05 MED ORDER — METOPROLOL SUCCINATE ER 50 MG PO TB24: 150.0000 mg | ORAL_TABLET | Freq: Two times a day (BID) | ORAL | Status: DC

## 2023-07-05 MED ORDER — POTASSIUM CHLORIDE 10 MEQ/100ML IV SOLN
10.0000 meq | INTRAVENOUS | Status: AC
  Administered 2023-07-05 (×5): 10 meq via INTRAVENOUS
  Filled 2023-07-05 (×5): qty 100

## 2023-07-05 MED ORDER — HYDROMORPHONE HCL 1 MG/ML IJ SOLN: 0.5000 mg | INTRAMUSCULAR | Status: DC | PRN

## 2023-07-05 MED ORDER — POLYETHYLENE GLYCOL 3350 17 G PO PACK
17.0000 g | PACK | Freq: Two times a day (BID) | ORAL | Status: DC
  Administered 2023-07-05 – 2023-07-10 (×8): 17 g via ORAL
  Filled 2023-07-05 (×11): qty 1

## 2023-07-05 MED ORDER — LOPERAMIDE HCL 2 MG PO CAPS
2.0000 mg | ORAL_CAPSULE | Freq: Once | ORAL | Status: AC
  Administered 2023-07-05: 2 mg via ORAL
  Filled 2023-07-05: qty 1

## 2023-07-05 MED ORDER — INSULIN GLARGINE-YFGN 100 UNIT/ML ~~LOC~~ SOLN
8.0000 [IU] | Freq: Every day | SUBCUTANEOUS | Status: DC
  Administered 2023-07-05 – 2023-07-24 (×19): 8 [IU] via SUBCUTANEOUS
  Filled 2023-07-05 (×20): qty 0.08

## 2023-07-05 NOTE — Progress Notes (Signed)
PHARMACY - ANTICOAGULATION CONSULT NOTE  Pharmacy Consult for IV heparin Indication: atrial fibrillation  No Known Allergies  Patient Measurements: Height: 5\' 5"  (165.1 cm) Weight: 80.7 kg (177 lb 14.6 oz) IBW/kg (Calculated) : 61.5 Heparin Dosing Weight: 80.7 kg   Vital Signs: Temp: 98.2 F (36.8 C) (11/04 0521) BP: 129/76 (11/04 0905) Pulse Rate: 81 (11/04 0905)  Labs: Recent Labs    07/03/23 0615 07/04/23 0808 07/04/23 1644 07/05/23 0503  HGB 11.9*  --   --  13.4  HCT 37.5*  --   --  42.7  PLT 201  --   --  282  CREATININE 1.30* 1.27* 1.44* 1.35*    Estimated Creatinine Clearance: 44.1 mL/min (A) (by C-G formula based on SCr of 1.35 mg/dL (H)).   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   06/15/2023 at am   ergocalciferol (VITAMIN D2) 1.25 MG (50000 UT) capsule Take 50,000 Units by mouth every 30 (thirty) days. On the 7 th of every month.   06/07/2023 at am   furosemide (LASIX) 40 MG tablet Take 1 tablet (40 mg total) by mouth daily. 30 tablet  06/15/2023 at am   latanoprost (XALATAN) 0.005 % ophthalmic solution Place 1 drop into both eyes at bedtime.   06/15/2023 at pm   levETIRAcetam (KEPPRA) 500 MG tablet Take 500 mg by mouth 2 (two) times daily.   06/15/2023 at pm   metoprolol tartrate (LOPRESSOR) 25 MG tablet Take 1 tablet (25 mg total) by mouth 2 (two) times daily.   06/15/2023 at pm   PARoxetine (PAXIL) 10 MG tablet Take 10 mg by mouth daily.   06/15/2023 at am   potassium chloride SA (K-DUR,KLOR-CON) 20 MEQ tablet Take 2 tablets (40 mEq total) by  mouth daily.   06/15/2023 at am   simvastatin (ZOCOR) 20 MG tablet Take 20 mg by mouth at bedtime.   06/15/2023 at pm    Assessment: Pharmacy is consulted to start hepairn drip on 77 yo male diagnosed with atrial fibrillation. Pt has been changed to heparin drip. Last dose of apixaban at 0904 on 11/4.   Today, 07/05/23  SCr 1.35 mg/dl, Crcl 44 ml/min  Hgb 91.4, plt 282   Goal of Therapy:  Heparin level 0.3-0.7 units/ml aPTT 66-102 sec  Monitor platelets by anticoagulation protocol: Yes   Plan:  Starting at 2100, start heparin 1200 units Obtain aptt/HL 8 hours after start of infusion Daily CBC  Monitor for signs and symptoms of bleeding   Adalberto Cole, PharmD, BCPS 07/05/2023 2:35 PM

## 2023-07-05 NOTE — Plan of Care (Signed)
Problem: Education: Goal: Ability to describe self-care measures that may prevent or decrease complications (Diabetes Survival Skills Education) will improve Outcome: Progressing Goal: Individualized Educational Video(s) Outcome: Progressing   Problem: Coping: Goal: Ability to adjust to condition or change in health will improve Outcome: Progressing   Problem: Fluid Volume: Goal: Ability to maintain a balanced intake and output will improve Outcome: Progressing   Problem: Health Behavior/Discharge Planning: Goal: Ability to identify and utilize available resources and services will improve Outcome: Progressing Goal: Ability to manage health-related needs will improve Outcome: Progressing   Problem: Metabolic: Goal: Ability to maintain appropriate glucose levels will improve Outcome: Progressing   Problem: Nutritional: Goal: Maintenance of adequate nutrition will improve Outcome: Progressing Goal: Progress toward achieving an optimal weight will improve Outcome: Progressing   Problem: Skin Integrity: Goal: Risk for impaired skin integrity will decrease Outcome: Progressing   Problem: Tissue Perfusion: Goal: Adequacy of tissue perfusion will improve Outcome: Progressing   Problem: Education: Goal: Knowledge of General Education information will improve Description: Including pain rating scale, medication(s)/side effects and non-pharmacologic comfort measures Outcome: Progressing   Problem: Health Behavior/Discharge Planning: Goal: Ability to manage health-related needs will improve Outcome: Progressing   Problem: Clinical Measurements: Goal: Ability to maintain clinical measurements within normal limits will improve Outcome: Progressing Goal: Will remain free from infection Outcome: Progressing Goal: Diagnostic test results will improve Outcome: Progressing Goal: Respiratory complications will improve Outcome: Progressing Goal: Cardiovascular complication will  be avoided Outcome: Progressing   Problem: Activity: Goal: Risk for activity intolerance will decrease Outcome: Progressing   Problem: Nutrition: Goal: Adequate nutrition will be maintained Outcome: Progressing   Problem: Coping: Goal: Level of anxiety will decrease Outcome: Progressing   Problem: Elimination: Goal: Will not experience complications related to bowel motility Outcome: Progressing Goal: Will not experience complications related to urinary retention Outcome: Progressing   Problem: Pain Managment: Goal: General experience of comfort will improve Outcome: Progressing   Problem: Safety: Goal: Ability to remain free from injury will improve Outcome: Progressing   Problem: Skin Integrity: Goal: Risk for impaired skin integrity will decrease Outcome: Progressing   Problem: Fluid Volume: Goal: Hemodynamic stability will improve Outcome: Progressing   Problem: Clinical Measurements: Goal: Diagnostic test results will improve Outcome: Progressing Goal: Signs and symptoms of infection will decrease Outcome: Progressing   Problem: Respiratory: Goal: Ability to maintain adequate ventilation will improve Outcome: Progressing   Problem: Education: Goal: Ability to describe self-care measures that may prevent or decrease complications (Diabetes Survival Skills Education) will improve Outcome: Progressing Goal: Individualized Educational Video(s) Outcome: Progressing   Problem: Cardiac: Goal: Ability to maintain an adequate cardiac output will improve Outcome: Progressing   Problem: Health Behavior/Discharge Planning: Goal: Ability to identify and utilize available resources and services will improve Outcome: Progressing Goal: Ability to manage health-related needs will improve Outcome: Progressing   Problem: Fluid Volume: Goal: Ability to achieve a balanced intake and output will improve Outcome: Progressing   Problem: Metabolic: Goal: Ability to  maintain appropriate glucose levels will improve Outcome: Progressing   Problem: Nutritional: Goal: Maintenance of adequate nutrition will improve Outcome: Progressing Goal: Maintenance of adequate weight for body size and type will improve Outcome: Progressing   Problem: Respiratory: Goal: Will regain and/or maintain adequate ventilation Outcome: Progressing   Problem: Urinary Elimination: Goal: Ability to achieve and maintain adequate renal perfusion and functioning will improve Outcome: Progressing   Problem: Education: Goal: Knowledge of disease or condition will improve Outcome: Progressing Goal: Knowledge of secondary prevention will improve (  MUST DOCUMENT ALL) Outcome: Progressing Goal: Knowledge of patient specific risk factors will improve Loraine Leriche N/A or DELETE if not current risk factor) Outcome: Progressing   Problem: Ischemic Stroke/TIA Tissue Perfusion: Goal: Complications of ischemic stroke/TIA will be minimized Outcome: Progressing

## 2023-07-05 NOTE — Progress Notes (Signed)
[facility] Progress Note Patient: Eric Zamora:[id number] DOB: February 18, 1946 DOA: 06/15/2023  DOS: the patient was seen and examined on 07/05/2023  Brief hospital course: Eric Zamora is a 77 y.o. 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 10/15 admitted to stepdown unit. 10/17: Overnight with fever, aspiration event, new AG acidosis, transferred back to SDU for BiPAP and insulin drip 10/18: taken to OR for drainage of abscess 10/23 cardiology was consulted for new onset A-fib with RVR. 10/24 nephrology was consulted for AKI and hyponatremia.  CT head was obtained for ongoing AMS and was found to have acute/subacute right MCA territory infarct.  Neurology was also consulted. 10/25.  Palliative care was consulted. 10/31.  Anticoagulation started again. 11/1.  Second dose of Bicillin given.  Underwent MBS. 11/2.  Marinol started for poor p.o. intake. 11/4.  Found to have ileus.  GI consulted.  Recommended DNR to legal guardian.  Assessment and Plan: Severe sepsis due to perirectal abscess present on admission. On oral antibiotic now. Underwent I and D on 10/18.  Penrose drain was placed and later on removed after a few days. No cultures were sent. Started on IV vancomycin, Flagyl, ceftriaxone, later on was switched to cefepime and has been on Augmentin and doxycycline.  Total antibiotic duration from 10/15 to 11/2.  2 weeks from initial I&D.  Will discontinue antibiotic at present. Monitor.   Ileus. Found to have ileus in 11/4 x-ray. CT scan pending. [facility] consulted. Maintain K more than 4, mag more than 2. N.p.o. for now. IV hydration. Switch medications to IV. Palliative care consulted again.  Recommend DNR to legal guardian.   New onset A-fib with RVR. HOCM. Cardiology was consulted.  Was on 100 4 times  daily before for metoprolol. Was on Toprol-XL 100-50/100 mg. Now due to ileus on IV Lopressor only. Was on Eliquis later on was on IV heparin and then transition back to Eliquis.  Now back on IV heparin due to ileus.   Acute metabolic encephalopathy. History of dementia. Acute right MCA stroke. Progressively worsening mentation and early hospital stay. Neurology was consulted. 10/24 CT head showed evidence of acute/subacute cortical and subcortical right MCA territory infarct. MRI brain confirmed the infarct. As per neurology recommendation a CT scan was repeated on 10/31.  Given that there was no hemorrhagic conversion neurology recommended to resume anticoagulation with Eliquis. PT OT recommended SNF. SLP following.  On dysphagia 1 diet.   History of seizures. Depakote on hold. On Keppra. EEG unremarkable. Monitor.   Moderate MR. Monitor volume status.  Dysphagia.  Due to stroke. Speech therapy following. Underwent MBS. Currently on dysphagia 1 diet.  Possible thrush. Possible pill induced gastritis. Antibiotic discontinued. Will be on oral fluconazole.  Until 11/7. Already has completed therapy with nystatin.  History of syphilis. RPR reactive for 2 years. Per ID patient currently being treated with Bicillin on a weekly basis. Next dose 11/8.  Last dose.   AKI on CKD 3B. Baseline around 1.3. Worsened to 1.99. Improved significantly to normal values and now stable at 1.3. For now monitoring.   Hypernatremia.  Recurrent. Likely from poor p.o. intake.   Will recheck to verify.  Initiate D5 half-normal saline if sodium level truly elevated.   CAD Patient not on any DAPT or antiplatelet therapy secondary to being  on Eliquis.   Bipolar disorder. Mentation stable.  Not on any medication.  Monitor.   Type II DM, uncontrolled with hyperglycemia. Currently on sliding scale insulin.  Poor p.o. intake. Now appears to have ileus.  Initially had no  findings. Initiated on Marinol.  Will hold.  Hypokalemia. Aggressively corrected. Will use IV potassium for now.  Metabolic acidosis. Unclear etiology but possibly related to renal function. Will monitor for now.  Anion gap normal.  Goals of care conversation. Discussed with legal guardian on phone. Patient already had poor prognosis given his large right MCA infarct with deficits and dysphagia. On top of that he had poor p.o. intake without any significant abnormality. Now appears to have severe ileus for which treatment has been initiated. This will not result in patient returning to his prior to admission status in the best case scenario. And in an event patient has a cardiac arrest conditions prognosis remains very poor to return to his prior to arrest status. Quality of life is not great and recommend legal guardian to consider DNR for the patient. Will have palliative care evaluate the patient as well.   Subjective: Abdomen distended.  Denies any acute complaint.  No vomiting.  No diarrhea reported.  Physical Exam: General: in Mild distress, No Rash Cardiovascular: S1 and S2 Present, No Murmur Respiratory: Good respiratory effort, Bilateral Air entry present. No Crackles, No wheezes Abdomen: Bowel Sound present, severely distended, no tenderness Extremities: Trace edema Neuro: Alert and oriented x 2 , no new focal deficit, dysarthria and left-sided weakness  Data Reviewed: I have Reviewed nursing notes, Vitals, and Lab results. Since last encounter, pertinent lab results CBC and CMP   . I have ordered test including CBC and CMP  . I have discussed pt's care plan and test results with palliative care in [facility] as well as radiology  . I have ordered imaging x-ray abdomen and CT abdomen  .   Disposition: Status is: Inpatient Remains inpatient appropriate because: Critically ill.  Has severe ileus.  SCDs Start: 06/15/23 1459   Family Communication: Discussed with legal  guardian on the phone on 11/4. Level of care: Telemetry continue for now. Vitals:   07/04/23 1742 07/04/23 2015 07/05/23 0521 07/05/23 0905  BP: (!) 148/87 129/68 129/76 129/76  Pulse: (!) 107 86 81 81  Resp:      Temp:  98.6 F (37 C) 98.2 F (36.8 C)   TempSrc:  Oral    SpO2:  100% 100%   Weight:      Height:        The patient is critically ill with multiple organ systems failure and requires high complexity decision making for assessment and support, frequent evaluation and titration of therapies. Critical Care Time devoted to patient care services described in this note is 35 minutes  Author: Lynden Oxford, MD 07/05/2023 5:42 PM  Please look on [email or web address] to find out who is on call.

## 2023-07-05 NOTE — Consult Note (Signed)
Reason for Consult: Questionable ileus Referring Physician: Hospital team  Eric Zamora is an 77 y.o. male.  HPI: Patient seen and examined in his hospital computer chart reviewed and case discussed with the hospital team and patient currently is not complaining of any obvious pain and he has multiple medical problems and appears to be bed ridden and he is a set up for chronic ileus and chronic problems and his CT scan was reviewed and it is even hard to know if he is answering yes/no questions properly  Past Medical History:  Diagnosis Date   Alzheimer disease (HCC)    Bipolar affective disorder (HCC)    Chronic renal insufficiency    Dementia (HCC)    Dyslipidemia    HTN (hypertension)    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   gradient on TEE   Syncopal episodes     Past Surgical History:  Procedure Laterality Date   INCISION AND DRAINAGE PERIRECTAL ABSCESS N/A 06/18/2023   Procedure: IRRIGATION AND DEBRIDEMENT PERIRECTAL ABSCESS;  Surgeon: Axel Filler, MD;  Location: [facility];  Service: General;  Laterality: N/A;   LEFT HEART CATHETERIZATION WITH CORONARY ANGIOGRAM N/A 07/06/2013   Procedure: LEFT HEART CATHETERIZATION WITH CORONARY ANGIOGRAM;  Surgeon: Marykay Lex, MD;  Location: [facility] CATH LAB;  Service: Cardiovascular;  Laterality: N/A;    History reviewed. No pertinent family history.  Social History:  reports that he has never smoked. He has never used smokeless tobacco. He reports that he does not currently use drugs. He reports that he does not drink alcohol.  Allergies: No Known Allergies  Medications: I have reviewed the patient's current medications.  Results for orders placed or performed during the hospital encounter of 06/15/23 (from the past 48 hour(s))  Glucose, capillary     Status: Abnormal   Collection Time: 07/03/23  9:28 PM  Result Value Ref Range   Glucose-Capillary 148 (H) 70 - 99 mg/dL    Comment: Glucose reference range applies  only to samples taken after fasting for at least 8 hours.   Comment 1 Notify RN   Basic metabolic panel     Status: Abnormal   Collection Time: 07/04/23  8:08 AM  Result Value Ref Range   Sodium 144 135 - 145 mmol/L    Comment: DELTA CHECK NOTED   Potassium 2.7 (LL) 3.5 - 5.1 mmol/L    Comment: CRITICAL RESULT CALLED TO, READ BACK BY AND VERIFIED WITH CLAPP,B. RN AT 4098 07/04/23 MULLINS,T    Chloride 114 (H) 98 - 111 mmol/L   CO2 19 (L) 22 - 32 mmol/L   Glucose, Bld 158 (H) 70 - 99 mg/dL    Comment: Glucose reference range applies only to samples taken after fasting for at least 8 hours.   BUN 27 (H) 8 - 23 mg/dL   Creatinine, Ser 1.19 (H) 0.61 - 1.24 mg/dL   Calcium 8.8 (L) 8.9 - 10.3 mg/dL   GFR, Estimated 58 (L) >60 mL/min    Comment: (NOTE) Calculated using the CKD-EPI Creatinine Equation (2021)    Anion gap 11 5 - 15    Comment: Performed at [facility], [address] [place], [place] [address]  Magnesium     Status: Abnormal   Collection Time: 07/04/23  8:08 AM  Result Value Ref Range   Magnesium 2.5 (H) 1.7 - 2.4 mg/dL    Comment: Performed at [facility], [address] [place], [place] [address]  Glucose, capillary  Status: Abnormal   Collection Time: 07/04/23  8:11 AM  Result Value Ref Range   Glucose-Capillary 153 (H) 70 - 99 mg/dL    Comment: Glucose reference range applies only to samples taken after fasting for at least 8 hours.  Glucose, capillary     Status: Abnormal   Collection Time: 07/04/23 12:14 PM  Result Value Ref Range   Glucose-Capillary 130 (H) 70 - 99 mg/dL    Comment: Glucose reference range applies only to samples taken after fasting for at least 8 hours.  Basic metabolic panel     Status: Abnormal   Collection Time: 07/04/23  4:44 PM  Result Value Ref Range   Sodium 141 135 - 145 mmol/L   Potassium 2.8 (L) 3.5 - 5.1 mmol/L   Chloride 114 (H) 98 - 111 mmol/L   CO2 19 (L) 22 - 32 mmol/L   Glucose,  Bld 159 (H) 70 - 99 mg/dL    Comment: Glucose reference range applies only to samples taken after fasting for at least 8 hours.   BUN 30 (H) 8 - 23 mg/dL   Creatinine, Ser 1.61 (H) 0.61 - 1.24 mg/dL   Calcium 8.8 (L) 8.9 - 10.3 mg/dL   GFR, Estimated 50 (L) >60 mL/min    Comment: (NOTE) Calculated using the CKD-EPI Creatinine Equation (2021)    Anion gap 8 5 - 15    Comment: Performed at [facility], [address], [place], [place] [address]  Glucose, capillary     Status: Abnormal   Collection Time: 07/04/23  5:31 PM  Result Value Ref Range   Glucose-Capillary 145 (H) 70 - 99 mg/dL    Comment: Glucose reference range applies only to samples taken after fasting for at least 8 hours.  Glucose, capillary     Status: Abnormal   Collection Time: 07/04/23  8:29 PM  Result Value Ref Range   Glucose-Capillary 115 (H) 70 - 99 mg/dL    Comment: Glucose reference range applies only to samples taken after fasting for at least 8 hours.   Comment 1 Notify RN    Comment 2 Document in Chart   Glucose, capillary     Status: Abnormal   Collection Time: 07/05/23 12:14 AM  Result Value Ref Range   Glucose-Capillary 106 (H) 70 - 99 mg/dL    Comment: Glucose reference range applies only to samples taken after fasting for at least 8 hours.   Comment 1 Notify RN    Comment 2 Document in Chart   Basic metabolic panel     Status: Abnormal   Collection Time: 07/05/23  5:03 AM  Result Value Ref Range   Sodium 145 135 - 145 mmol/L   Potassium 2.9 (L) 3.5 - 5.1 mmol/L   Chloride 116 (H) 98 - 111 mmol/L   CO2 18 (L) 22 - 32 mmol/L   Glucose, Bld 203 (H) 70 - 99 mg/dL    Comment: Glucose reference range applies only to samples taken after fasting for at least 8 hours.   BUN 30 (H) 8 - 23 mg/dL   Creatinine, Ser 5.40 (H) 0.61 - 1.24 mg/dL   Calcium 9.0 8.9 - 98.1 mg/dL   GFR, Estimated 54 (L) >60 mL/min    Comment: (NOTE) Calculated using the CKD-EPI Creatinine Equation (2021)     Anion gap 11 5 - 15    Comment: Performed at [facility], [address] [place], [place] [address]  Magnesium  Status: Abnormal   Collection Time: 07/05/23  5:03 AM  Result Value Ref Range   Magnesium 2.6 (H) 1.7 - 2.4 mg/dL    Comment: Performed at [facility], [address] [place], [place] [address]  CBC     Status: None   Collection Time: 07/05/23  5:03 AM  Result Value Ref Range   WBC 8.7 4.0 - 10.5 K/uL   RBC 4.63 4.22 - 5.81 MIL/uL   Hemoglobin 13.4 13.0 - 17.0 g/dL   HCT 40.9 81.1 - 91.4 %   MCV 92.2 80.0 - 100.0 fL   MCH 28.9 26.0 - 34.0 pg   MCHC 31.4 30.0 - 36.0 g/dL   RDW 78.2 95.6 - 21.3 %   Platelets 282 150 - 400 K/uL   nRBC 0.0 0.0 - 0.2 %    Comment: Performed at [facility], [address], [place], [place] [address]  Glucose, capillary     Status: Abnormal   Collection Time: 07/05/23  8:47 AM  Result Value Ref Range   Glucose-Capillary 146 (H) 70 - 99 mg/dL    Comment: Glucose reference range applies only to samples taken after fasting for at least 8 hours.  Glucose, capillary     Status: Abnormal   Collection Time: 07/05/23 12:26 PM  Result Value Ref Range   Glucose-Capillary 159 (H) 70 - 99 mg/dL    Comment: Glucose reference range applies only to samples taken after fasting for at least 8 hours.  Basic metabolic panel     Status: Abnormal   Collection Time: 07/05/23  3:04 PM  Result Value Ref Range   Sodium 150 (H) 135 - 145 mmol/L   Potassium 3.7 3.5 - 5.1 mmol/L   Chloride 125 (H) 98 - 111 mmol/L   CO2 18 (L) 22 - 32 mmol/L   Glucose, Bld 126 (H) 70 - 99 mg/dL    Comment: Glucose reference range applies only to samples taken after fasting for at least 8 hours.   BUN 31 (H) 8 - 23 mg/dL   Creatinine, Ser 8.46 (H) 0.61 - 1.24 mg/dL   Calcium 8.7 (L) 8.9 - 10.3 mg/dL   GFR, Estimated 48 (L) >60 mL/min    Comment: (NOTE) Calculated using the CKD-EPI Creatinine Equation (2021)    Anion  gap 7 5 - 15    Comment: Performed at [facility], [address], [place], [place] [address]  Glucose, capillary     Status: Abnormal   Collection Time: 07/05/23  4:13 PM  Result Value Ref Range   Glucose-Capillary 121 (H) 70 - 99 mg/dL    Comment: Glucose reference range applies only to samples taken after fasting for at least 8 hours.    DG Abd Portable 1V  Result Date: 07/05/2023 CLINICAL DATA:  Abdominal discomfort. EXAM: PORTABLE ABDOMEN - 1 VIEW COMPARISON:  July 04, 2013. FINDINGS: Dilated and air-filled colon is noted concerning for ileus. No significant small bowel dilatation is noted. IMPRESSION: Dilated and air-filled colon is noted concerning for ileus or less likely distal colonic obstruction. Electronically Signed   By: Lupita Raider M.D.   On: 07/05/2023 14:58    ROS negative except above Blood pressure 129/76, pulse 81, temperature 98.2 F (36.8 C), resp. rate 16, height 5\' 5"  (1.651 m), weight 80.7 kg, SpO2 100%. Physical Exam vital signs stable afebrile no acute distress lying comfortably in the bed abdomen is soft nontender some high-pitched bowel sounds and some tympany  potassium up to 3.7 from 2.9 and even lower yesterday white count has been normal final CT report of today pending but appears to be ileus  Assessment/Plan: Ileus in patient with multiple medical problems Plan: Might consider palliative care if family is willing otherwise prefer potassium to be greater than 4 minimize and stop all pain medicine try to encourage physical therapy to get him out of bed and increase activity use MiraLAX to ensure bowel movements and avoid constipation and consider a motility agent like Reglan or erythromycin and possibly even Motegrity as an outpatient which is oral only as far as I know and I am not sure if the hospital can get that and I will check on final CT report later today but if no significant other finding please call me back if I can be of any  further assistance with this hospital stay and when doing better may readvance diet  Eric Zamora 07/05/2023, 4:44 PM

## 2023-07-05 NOTE — Progress Notes (Signed)
Physical Therapy Treatment Patient Details Name: Eric Zamora MRN: [id number] DOB: [birth year] Today's Date: 07/05/2023   History of Present Illness Pt is 77 yo admitted with severe sepsis due to perirectal abscess.  Pt to OR on 10/18 for drainage of abscess.  Pt with increased lethargy on 10/24 , MRI Head performed and pt found to have acute infarct  right frontal operculum with moderate associated cytotoxic edema and petechial hemorrhage. PMH: bipolar disorder, dementia, history of NSTEMI, hypertrophic cardiomyopathy    PT Comments  Pt with increased lethargy today - required increased assist for bed mobility and sitting EOB.  At EOB, leaning to R , improved after having him lean on L elbow 30 sec.  Attempting to lie back down and lethargic - unable to get to chair today.  Do still recommend Patient will benefit from continued inpatient follow up therapy, <3 hours/day at d/c    If plan is discharge home, recommend the following: Two people to help with walking and/or transfers;Two people to help with bathing/dressing/bathroom   Can travel by private vehicle     No  Equipment Recommendations  None recommended by PT    Recommendations for Other Services       Precautions / Restrictions Precautions Precautions: Fall     Mobility  Bed Mobility Overal bed mobility: Needs Assistance Bed Mobility: Rolling Rolling: Mod assist   Supine to sit: Mod assist, +2 for safety/equipment Sit to supine: Mod assist, +2 for physical assistance   General bed mobility comments: increased time, effort, with max cues , heavy mod A x 2 today to transfer to sitting; heavy mod to roll with assist to position extremities (rolled both direction x 2 for cleaning)    Transfers                   General transfer comment: not able today - too lethargic, wanting to lie down    Ambulation/Gait                   Stairs             Wheelchair Mobility     Tilt Bed     Modified Rankin (Stroke Patients Only) Modified Rankin (Stroke Patients Only) Pre-Morbid Rankin Score: Moderate disability Modified Rankin: Severe disability     Balance Overall balance assessment: Needs assistance Sitting-balance support: Bilateral upper extremity supported Sitting balance-Leahy Scale: Poor Sitting balance - Comments: Pt tending to lean R and mildly posterior.  Pt with difficulty correcting initially requiring at least mod A.  Had pt lean onto L elbow (max A to get there), held 30 sec, return to sitting and able to sit with supervision and  UE support.  Then able to reach forward to target 5 x bil UE and across body and forward 5 x bil UE. Pt then leaning back and wanting to lie down.  Only EOB ~5 mins today.                                    Cognition Arousal: Lethargic Behavior During Therapy: Flat affect Overall Cognitive Status: No family/caregiver present to determine baseline cognitive functioning                                 General Comments: Pt follows 1 step commands; Speech often unintelligble; more lethargic today  and often keeping eyes closed        Exercises General Exercises - Upper Extremity Shoulder Flexion: AROM, Both, 15 reps, Supine (cues full ROM, controlled speed) General Exercises - Lower Extremity Ankle Circles/Pumps: AROM, Both, 20 reps, Supine Heel Slides: AROM, Both, 5 reps, Supine Straight Leg Raises: AROM, Both, 5 reps, Supine Other Exercises Other Exercises: Cues for full controlled ROM    General Comments General comments (skin integrity, edema, etc.): HR 90's -110's today (rest and activity)      Pertinent Vitals/Pain Pain Assessment Pain Assessment: Faces Pain Location: Pt denies pain but noted facial wincing/grunting with transfers    Home Living                          Prior Function            PT Goals (current goals can now be found in the care plan section) Progress  towards PT goals: Not progressing toward goals - comment (lethargic today)    Frequency    Min 1X/week      PT Plan      Co-evaluation              AM-PAC PT "6 Clicks" Mobility   Outcome Measure  Help needed turning from your back to your side while in a flat bed without using bedrails?: A Lot Help needed moving from lying on your back to sitting on the side of a flat bed without using bedrails?: Total Help needed moving to and from a bed to a chair (including a wheelchair)?: Total Help needed standing up from a chair using your arms (e.g., wheelchair or bedside chair)?: Total Help needed to walk in hospital room?: Total Help needed climbing 3-5 steps with a railing? : Total 6 Click Score: 7    End of Session Equipment Utilized During Treatment: Gait belt Activity Tolerance: Patient limited by lethargy Patient left: with call bell/phone within reach;in bed;with bed alarm set;with SCD's reapplied Nurse Communication: Mobility status PT Visit Diagnosis: Other abnormalities of gait and mobility (R26.89);Muscle weakness (generalized) (M62.81)     Time: 8295-6213 PT Time Calculation (min) (ACUTE ONLY): 30 min  Charges:    $Therapeutic Activity: 8-22 mins $Neuromuscular Re-education: 8-22 mins PT General Charges $$ ACUTE PT VISIT: 1 Visit                     Anise Salvo, PT Acute Rehab [facility] [phone]    Rayetta Humphrey 07/05/2023, 1:31 PM

## 2023-07-05 NOTE — Plan of Care (Signed)
Pt continues to have loose stools this shift.  Problem: Coping: Goal: Ability to adjust to condition or change in health will improve Outcome: Progressing   Problem: Fluid Volume: Goal: Ability to maintain a balanced intake and output will improve Outcome: Progressing   Problem: Metabolic: Goal: Ability to maintain appropriate glucose levels will improve Outcome: Progressing   Problem: Skin Integrity: Goal: Risk for impaired skin integrity will decrease Outcome: Progressing   Problem: Tissue Perfusion: Goal: Adequacy of tissue perfusion will improve Outcome: Progressing   Problem: Education: Goal: Knowledge of General Education information will improve Description: Including pain rating scale, medication(s)/side effects and non-pharmacologic comfort measures Outcome: Progressing   Problem: Clinical Measurements: Goal: Ability to maintain clinical measurements within normal limits will improve Outcome: Progressing Goal: Will remain free from infection Outcome: Progressing Goal: Diagnostic test results will improve Outcome: Progressing Goal: Respiratory complications will improve Outcome: Progressing Goal: Cardiovascular complication will be avoided Outcome: Progressing   Problem: Coping: Goal: Level of anxiety will decrease Outcome: Progressing   Problem: Elimination: Goal: Will not experience complications related to bowel motility Outcome: Progressing Goal: Will not experience complications related to urinary retention Outcome: Progressing   Problem: Pain Managment: Goal: General experience of comfort will improve Outcome: Progressing   Problem: Safety: Goal: Ability to remain free from injury will improve Outcome: Progressing   Problem: Skin Integrity: Goal: Risk for impaired skin integrity will decrease Outcome: Progressing   Problem: Fluid Volume: Goal: Hemodynamic stability will improve Outcome: Progressing   Problem: Clinical Measurements: Goal:  Diagnostic test results will improve Outcome: Progressing Goal: Signs and symptoms of infection will decrease Outcome: Progressing   Problem: Respiratory: Goal: Ability to maintain adequate ventilation will improve Outcome: Progressing   Problem: Cardiac: Goal: Ability to maintain an adequate cardiac output will improve Outcome: Progressing   Problem: Health Behavior/Discharge Planning: Goal: Ability to manage health-related needs will improve Outcome: Progressing   Problem: Metabolic: Goal: Ability to maintain appropriate glucose levels will improve Outcome: Progressing   Problem: Nutritional: Goal: Maintenance of adequate nutrition will improve Outcome: Progressing Goal: Maintenance of adequate weight for body size and type will improve Outcome: Progressing   Problem: Respiratory: Goal: Will regain and/or maintain adequate ventilation Outcome: Progressing   Problem: Urinary Elimination: Goal: Ability to achieve and maintain adequate renal perfusion and functioning will improve Outcome: Progressing

## 2023-07-06 ENCOUNTER — Inpatient Hospital Stay (HOSPITAL_COMMUNITY): Payer: Medicare (Managed Care)

## 2023-07-06 DIAGNOSIS — F319 Bipolar disorder, unspecified: Secondary | ICD-10-CM | POA: Diagnosis not present

## 2023-07-06 DIAGNOSIS — Z515 Encounter for palliative care: Secondary | ICD-10-CM | POA: Diagnosis not present

## 2023-07-06 DIAGNOSIS — I4891 Unspecified atrial fibrillation: Secondary | ICD-10-CM | POA: Diagnosis not present

## 2023-07-06 DIAGNOSIS — I63511 Cerebral infarction due to unspecified occlusion or stenosis of right middle cerebral artery: Secondary | ICD-10-CM | POA: Diagnosis not present

## 2023-07-06 DIAGNOSIS — R6520 Severe sepsis without septic shock: Secondary | ICD-10-CM | POA: Diagnosis not present

## 2023-07-06 DIAGNOSIS — A419 Sepsis, unspecified organism: Secondary | ICD-10-CM | POA: Diagnosis not present

## 2023-07-06 DIAGNOSIS — Z66 Do not resuscitate: Secondary | ICD-10-CM

## 2023-07-06 LAB — GLUCOSE, CAPILLARY
Glucose-Capillary: 121 mg/dL — ABNORMAL HIGH (ref 70–99)
Glucose-Capillary: 105 mg/dL — ABNORMAL HIGH (ref 70–99)
Glucose-Capillary: 144 mg/dL — ABNORMAL HIGH (ref 70–99)
Glucose-Capillary: 105 mg/dL — ABNORMAL HIGH (ref 70–99)
Glucose-Capillary: 88 mg/dL (ref 70–99)
Glucose-Capillary: 139 mg/dL — ABNORMAL HIGH (ref 70–99)

## 2023-07-06 LAB — BASIC METABOLIC PANEL
Anion gap: 7 (ref 5–15)
Anion gap: 13 (ref 5–15)
Anion gap: 8 (ref 5–15)
Anion gap: 8 (ref 5–15)
BUN: 31 mg/dL — ABNORMAL HIGH (ref 8–23)
BUN: 32 mg/dL — ABNORMAL HIGH (ref 8–23)
BUN: 31 mg/dL — ABNORMAL HIGH (ref 8–23)
BUN: 27 mg/dL — ABNORMAL HIGH (ref 8–23)
CO2: 13 mmol/L — ABNORMAL LOW (ref 22–32)
CO2: 17 mmol/L — ABNORMAL LOW (ref 22–32)
CO2: 16 mmol/L — ABNORMAL LOW (ref 22–32)
CO2: 15 mmol/L — ABNORMAL LOW (ref 22–32)
Calcium: 8.1 mg/dL — ABNORMAL LOW (ref 8.9–10.3)
Calcium: 8.9 mg/dL (ref 8.9–10.3)
Calcium: 8.2 mg/dL — ABNORMAL LOW (ref 8.9–10.3)
Calcium: 7.9 mg/dL — ABNORMAL LOW (ref 8.9–10.3)
Chloride: 129 mmol/L — ABNORMAL HIGH (ref 98–111)
Chloride: 119 mmol/L — ABNORMAL HIGH (ref 98–111)
Chloride: 124 mmol/L — ABNORMAL HIGH (ref 98–111)
Chloride: 128 mmol/L — ABNORMAL HIGH (ref 98–111)
Creatinine, Ser: 1.57 mg/dL — ABNORMAL HIGH (ref 0.61–1.24)
Creatinine, Ser: 1.62 mg/dL — ABNORMAL HIGH (ref 0.61–1.24)
Creatinine, Ser: 1.57 mg/dL — ABNORMAL HIGH (ref 0.61–1.24)
Creatinine, Ser: 1.45 mg/dL — ABNORMAL HIGH (ref 0.61–1.24)
GFR, Estimated: 45 mL/min — ABNORMAL LOW (ref >60)
GFR, Estimated: 43 mL/min — ABNORMAL LOW (ref >60)
GFR, Estimated: 45 mL/min — ABNORMAL LOW (ref >60)
GFR, Estimated: 49 mL/min — ABNORMAL LOW (ref >60)
Glucose, Bld: 136 mg/dL — ABNORMAL HIGH (ref 70–99)
Glucose, Bld: 124 mg/dL — ABNORMAL HIGH (ref 70–99)
Glucose, Bld: 131 mg/dL — ABNORMAL HIGH (ref 70–99)
Glucose, Bld: 130 mg/dL — ABNORMAL HIGH (ref 70–99)
Potassium: 3.1 mmol/L — ABNORMAL LOW (ref 3.5–5.1)
Potassium: 2.6 mmol/L — CL (ref 3.5–5.1)
Potassium: 4 mmol/L (ref 3.5–5.1)
Potassium: 2.8 mmol/L — ABNORMAL LOW (ref 3.5–5.1)
Sodium: 149 mmol/L — ABNORMAL HIGH (ref 135–145)
Sodium: 149 mmol/L — ABNORMAL HIGH (ref 135–145)
Sodium: 148 mmol/L — ABNORMAL HIGH (ref 135–145)
Sodium: 151 mmol/L — ABNORMAL HIGH (ref 135–145)

## 2023-07-06 LAB — CBC
HCT: 42.8 % (ref 39.0–52.0)
Hemoglobin: 13.5 g/dL (ref 13.0–17.0)
MCH: 28.9 pg (ref 26.0–34.0)
MCHC: 31.5 g/dL (ref 30.0–36.0)
MCV: 91.6 fL (ref 80.0–100.0)
Platelets: 267 10*3/uL (ref 150–400)
RBC: 4.67 MIL/uL (ref 4.22–5.81)
RDW: 15.5 % (ref 11.5–15.5)
WBC: 7.5 10*3/uL (ref 4.0–10.5)
nRBC: 0 % (ref 0.0–0.2)

## 2023-07-06 LAB — MAGNESIUM: Magnesium: 2.7 mg/dL — ABNORMAL HIGH (ref 1.7–2.4)

## 2023-07-06 LAB — APTT
aPTT: 51 s — ABNORMAL HIGH (ref 24–36)
aPTT: 140 s — ABNORMAL HIGH (ref 24–36)

## 2023-07-06 LAB — HEPARIN LEVEL (UNFRACTIONATED): Heparin Unfractionated: >1.1 [IU]/mL — ABNORMAL HIGH (ref 0.30–0.70)

## 2023-07-06 MED ORDER — KCL IN DEXTROSE-NACL 20-5-0.45 MEQ/L-%-% IV SOLN
INTRAVENOUS | Status: DC
  Filled 2023-07-06 (×2): qty 1000

## 2023-07-06 MED ORDER — POTASSIUM CHLORIDE 10 MEQ/100ML IV SOLN
10.0000 meq | INTRAVENOUS | Status: AC
  Administered 2023-07-06 (×6): 10 meq via INTRAVENOUS
  Filled 2023-07-06 (×6): qty 100

## 2023-07-06 MED ORDER — HEPARIN (PORCINE) 25000 UT/250ML-% IV SOLN
1200.0000 [IU]/h | INTRAVENOUS | Status: DC
  Administered 2023-07-07: 1200 [IU]/h via INTRAVENOUS
  Filled 2023-07-06: qty 250

## 2023-07-06 NOTE — Progress Notes (Signed)
SLP Cancellation Note  Patient Details Name: Eric Zamora MRN: [id number] DOB: Dec 07, 1945   Cancelled treatment:       Reason Eval/Treat Not Completed: Other (comment) (Note events over the last several days and pt likely with chronic ileus per GI note, current diet clears - nectar; Will continue efforts.)  [name], MS [facility] SLP Acute Rehab Services Office [phone]   [name] 07/06/2023, 8:11 AM

## 2023-07-06 NOTE — Progress Notes (Signed)
PHARMACY - ANTICOAGULATION CONSULT NOTE  Pharmacy Consult for IV heparin Indication: atrial fibrillation  No Known Allergies  Patient Measurements: Height: 5\' 5"  (165.1 cm) Weight: 76.4 kg (168 lb 6.9 oz) IBW/kg (Calculated) : 61.5 Heparin Dosing Weight: 80.7 kg   Vital Signs: Temp: 97.7 F (36.5 C) (11/05 0450) Temp Source: Oral (11/05 0450) BP: 138/72 (11/05 0450) Pulse Rate: 61 (11/05 0450)  Labs: Recent Labs    07/05/23 0503 07/05/23 1504 07/05/23 2033 07/05/23 2325 07/06/23 0518  HGB 13.4  --   --   --  13.5  HCT 42.7  --   --   --  42.8  PLT 282  --   --   --  267  APTT  --   --   --   --  51*  HEPARINUNFRC  --   --   --   --  >1.10*  CREATININE 1.35*   < > 1.46* 1.57* 1.62*   < > = values in this interval not displayed.    Estimated Creatinine Clearance: 35.9 mL/min (A) (by C-G formula based on SCr of 1.62 mg/dL (H)).   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   06/15/2023 at am   ergocalciferol (VITAMIN D2) 1.25 MG (50000 UT) capsule Take 50,000 Units by mouth every 30 (thirty) days. On the 7 th of every month.   06/07/2023 at am   furosemide (LASIX) 40 MG tablet Take 1 tablet (40 mg total) by mouth daily. 30 tablet  06/15/2023 at am   latanoprost (XALATAN) 0.005 % ophthalmic solution Place 1 drop into both eyes at bedtime.   06/15/2023 at pm   levETIRAcetam (KEPPRA) 500 MG tablet Take 500 mg by mouth 2 (two) times daily.   06/15/2023 at pm   metoprolol tartrate (LOPRESSOR) 25 MG tablet Take 1 tablet (25 mg total) by mouth 2 (two) times daily.    06/15/2023 at pm   PARoxetine (PAXIL) 10 MG tablet Take 10 mg by mouth daily.   06/15/2023 at am   potassium chloride SA (K-DUR,KLOR-CON) 20 MEQ tablet Take 2 tablets (40 mEq total) by mouth daily.   06/15/2023 at am   simvastatin (ZOCOR) 20 MG tablet Take 20 mg by mouth at bedtime.   06/15/2023 at pm    Assessment: Pharmacy is consulted to start hepairn drip on 77 yo male diagnosed with atrial fibrillation. Pt has been changed to heparin drip. Last dose of apixaban at 0904 on 11/4.   Today, 07/06/23  aPTT Is 51 sec, subtherapeutic HL >1.10, falsely high due to recent apixaban administration  SCr 1.62 mg/dl, Crcl 36 ml/min  Hgb 16.1, plt 267 No line or bleeding issues with the heparin drip    Goal of Therapy:  Heparin level 0.3-0.7 units/ml aPTT 66-102 sec  Monitor platelets by anticoagulation protocol: Yes   Plan:  Increase heparin to 1350 units/hr Obtain aptt 8 hours after start of rate change  Daily aPTT/HL  Will monitor using aPTT until HL and aPTT start to correlate Daily CBC  Monitor for signs and symptoms of bleeding   Adalberto Cole, PharmD, BCPS 07/06/2023 9:57 AM

## 2023-07-06 NOTE — Progress Notes (Signed)
  Daily Progress Note   Patient Name: Eric Zamora       Date: 07/06/2023 DOB: [birth year]  Age: 77 y.o. MRN#: [id number] Attending Physician: Rolly Salter, MD Primary Care Physician: Uvaldo Bristle, PA-C Admit Date: 06/15/2023 Length of Stay: 21 days  Reason for Consultation/Follow-up: Establishing goals of care  Subjective:   CC: Patient laying comfortably in bed. Following up regarding complex medical decision making.   Subjective:  EMR prior to presenting to bedside.  Patient laying comfortably in bed.  Patient did not voice any concerns regarding symptom management.  Cussed care with patient's primary hospitalist and TOC.  Hospitalist has been in contact with patient's legal guardian.  Discussed and agreeing with two-physician DNR at this time based on patient's multiple medical comorbidities.  Continuing appropriate medical care.   Objective:   Vital Signs:  BP 138/72 (BP Location: Left Arm)   Pulse 61   Temp 97.7 F (36.5 C) (Oral)   Resp 19   Ht 5\' 5"  (1.651 m)   Wt 76.4 kg   SpO2 98%   BMI 28.03 kg/m   Physical Exam: General: NAD, laying in bed, awake moist mucous membranes Cardiovascular: RRR Respiratory: no increased work of breathing noted, not in respiratory distress Neuro: awake, pleasantly confused at times  Imaging: I personally reviewed recent imaging.   Assessment & Plan:   Assessment: Patient is a 77 year old male with a past medical history of bipolar disorder, dementia, CKD, CAD, HFpEF, hypertension, history of DVT, and long-term care resident at SNF who was admitted on 06/15/2023 for management of fever, tachypnea, and found to have a perirectal abscess. During prolonged hospitalization patient has received medical management for perirectal abscess, right MCA,  AKI on CKD, new onset Afib with RVR, and ileus. Palliative medicine team consulted to assist with complex medical decision making.   Recommendations/Plan: # Complex medical  decision making/goals of care:  - Patient unable to participate in complex medical decision making due to medical status. Patient has legal guardian in place.  -It is my professional opinion that with a high degree of medical certainty that this patient has incurable and irreversible conditions. In the event of cardiac or respiratory arrest, an attempt at resuscitation would be unsuccessful in returning this patient to an already fragile and seriously ill state of health and cause unnecessary suffering at the end of life.  I support placing and maintaining a DNR order and support the care plan as outlined by the primary service. Completed two physician DNR form today with hospitalist.   # Symptom management:  -As per hospitalist   # Psychosocial Support:  -Legal Guardian- Eric Zamora  # Discharge Planning: To Be Determined  Discussed with: TOC, RN, hospitalist  Thank you for allowing the palliative care team to participate in the care [facility].  Alvester Morin, DO Palliative Care Provider PMT # [phone]  If patient remains symptomatic despite maximum doses, please call PMT at [phone] between 0700 and 1900. Outside of these hours, please call attending, as PMT does not have night coverage.  Personally spent 36 minutes in patient care including extensive chart review (labs, imaging, progress/consult notes, vital signs), medically appropraite exam, discussed with treatment team, education to patient, family, and staff, documenting clinical information, medication review and management, coordination of care, and available advanced directive documents.    *Please note that this is a verbal dictation therefore any spelling or grammatical errors are due to the "[employer]" system interpretation.

## 2023-07-06 NOTE — Progress Notes (Signed)
Occupational Therapy Treatment Patient Details Name: Eric Zamora MRN: [id number] DOB: 1946/04/21 Today's Date: 07/06/2023   History of present illness Pt is 77 yo admitted with severe sepsis due to perirectal abscess.  Pt to OR on 10/18 for drainage of abscess.  Pt with increased lethargy on 10/24 , MRI Head performed and pt found to have acute infarct  right frontal operculum with moderate associated cytotoxic edema and petechial hemorrhage. PMH: bipolar disorder, dementia, history of NSTEMI, hypertrophic cardiomyopathy   OT comments  Session limited due to lethargy. Pt in bed upon therapy arrival with eyes closed. Pt would awaken briefly with verbal stimuli although unable to maintain alertness. Session focused on bed mobility, repositioning, and positioning technique. Pt required max A to complete bed mobility with VC and physical assist to complete.          Equipment Recommendations  None recommended by OT       Precautions / Restrictions Precautions Precautions: Fall Precaution Comments: monitor HR Restrictions Weight Bearing Restrictions: No       Mobility Bed Mobility Overal bed mobility: Needs Assistance Bed Mobility: Rolling Rolling: Max assist, Used rails         General bed mobility comments: Increased time, effort, and max VC required to completing rolling right and left using rails. Physical assist provided to UE's to initiate reaching across body and grasping rail. Unable to maintain sidelying position for an extended amount of time. Boost bed feature used to provided total assist to boost towards HOB.    Transfers Overall transfer level:  (deferred this date due to decreased alertness)            ADL either performed or assessed with clinical judgement    Extremity/Trunk Assessment Upper Extremity Assessment Upper Extremity Assessment: RUE deficits/detail;LUE deficits/detail RUE Deficits / Details: prefers to keep right hand in a fisted position  while resting. Able to extend all fingers. Rolled 2 wash clothes together and placed in pt's hand. LUE Deficits / Details: noticeably decrease in edema. Nursing verbalizes use of edema management strategies while in bed to reduce edema.             Cognition Arousal: Obtunded Behavior During Therapy: Flat affect Overall Cognitive Status: No family/caregiver present to determine baseline cognitive functioning       General Comments: Pt followed 1 step commands inconsistently and with increased time                   Pertinent Vitals/ Pain       Pain Assessment Pain Assessment: Faces Faces Pain Scale: Hurts little more Pain Location: back pain verbalized when HOB was lowered. Pain Descriptors / Indicators: Grimacing Pain Intervention(s): Repositioned, Monitored during session, Limited activity within patient's tolerance         Frequency  Min 1X/week        Progress Toward Goals  OT Goals(current goals can now be found in the care plan section)  Progress towards OT goals: Not progressing toward goals - comment (level of arousal effecting ability to participate fully in OT session)            AM-PAC OT "6 Clicks" Daily Activity     Outcome Measure   Help from another person eating meals?: Total Help from another person taking care of personal grooming?: Total Help from another person toileting, which includes using toliet, bedpan, or urinal?: Total Help from another person bathing (including washing, rinsing, drying)?: Total Help from another person to put  on and taking off regular upper body clothing?: Total Help from another person to put on and taking off regular lower body clothing?: Total 6 Click Score: 6    End of Session    OT Visit Diagnosis: Other abnormalities of gait and mobility (R26.89);Muscle weakness (generalized) (M62.81);Cognitive communication deficit (R41.841)   Activity Tolerance Patient limited by lethargy   Patient Left in bed;with  call bell/phone within reach;with bed alarm set   Nurse Communication Mobility status        Time: 0160-1093 OT Time Calculation (min): 18 min  Charges: OT General Charges $OT Visit: 1 Visit OT Treatments $Therapeutic Activity: 8-22 mins  Limmie Patricia, OTR/L,CBIS  Supplemental OT - [facility] and [facility] Secure Chat Preferred    Ayumi Wangerin, Charisse March 07/06/2023, 3:53 PM

## 2023-07-06 NOTE — TOC Progression Note (Addendum)
Transition of Care [facility]) - Progression Note    Patient Details  Name: Eric Zamora MRN: [id number] Date of Birth: Aug 07, 1946  [facility]) CM/SW Contact  Otelia Santee, LCSW Phone Number: 07/06/2023, 9:25 AM  Clinical Narrative:    CSW left voicemail for pt's legal guardian to request 2 physician DNR form be emailed to CSW.   ADDENDUM: Received DNR form. DNR form signed by attending and palliative care provider Dr. Patterson Hammersmith. Form has been notarized and emailed back to pt's legal guardian, [name].   Expected Discharge Plan: Skilled Nursing Facility Barriers to Discharge: SNF Pending bed offer, Inadequate or no insurance  Expected Discharge Plan and Services     Post Acute Care Choice: Skilled Nursing Facility Living arrangements for the past 2 months: Assisted Living Facility [facility])                                       Social Determinants of Health (SDOH) Interventions SDOH Screenings   Food Insecurity: Patient Unable To Answer (06/15/2023)  Housing: Low Risk  (06/15/2023)  Transportation Needs: Patient Unable To Answer (06/15/2023)  Utilities: Patient Unable To Answer (06/15/2023)  Tobacco Use: Low Risk  (07/05/2023)    Readmission Risk Interventions    06/25/2023    2:17 PM  Readmission Risk Prevention Plan  Transportation Screening Complete  PCP or Specialist Appt within 5-7 Days Complete  Home Care Screening Complete  Medication Review (RN CM) Complete

## 2023-07-06 NOTE — Plan of Care (Signed)
  Problem: Health Behavior/Discharge Planning: Goal: Ability to manage health-related needs will improve Outcome: Progressing   Problem: Skin Integrity: Goal: Risk for impaired skin integrity will decrease Outcome: Progressing   Problem: Tissue Perfusion: Goal: Adequacy of tissue perfusion will improve Outcome: Progressing   Problem: Activity: Goal: Risk for activity intolerance will decrease Outcome: Progressing   Problem: Coping: Goal: Level of anxiety will decrease Outcome: Progressing

## 2023-07-06 NOTE — Plan of Care (Signed)
Problem: Education: Goal: Ability to describe self-care measures that may prevent or decrease complications (Diabetes Survival Skills Education) will improve Outcome: Progressing Goal: Individualized Educational Video(s) Outcome: Progressing   Problem: Coping: Goal: Ability to adjust to condition or change in health will improve Outcome: Progressing   Problem: Fluid Volume: Goal: Ability to maintain a balanced intake and output will improve Outcome: Progressing   Problem: Health Behavior/Discharge Planning: Goal: Ability to identify and utilize available resources and services will improve Outcome: Progressing Goal: Ability to manage health-related needs will improve Outcome: Progressing   Problem: Metabolic: Goal: Ability to maintain appropriate glucose levels will improve Outcome: Progressing   Problem: Nutritional: Goal: Maintenance of adequate nutrition will improve Outcome: Progressing Goal: Progress toward achieving an optimal weight will improve Outcome: Progressing   Problem: Skin Integrity: Goal: Risk for impaired skin integrity will decrease Outcome: Progressing   Problem: Tissue Perfusion: Goal: Adequacy of tissue perfusion will improve Outcome: Progressing   Problem: Education: Goal: Knowledge of General Education information will improve Description: Including pain rating scale, medication(s)/side effects and non-pharmacologic comfort measures Outcome: Progressing   Problem: Health Behavior/Discharge Planning: Goal: Ability to manage health-related needs will improve Outcome: Progressing   Problem: Clinical Measurements: Goal: Ability to maintain clinical measurements within normal limits will improve Outcome: Progressing Goal: Will remain free from infection Outcome: Progressing Goal: Diagnostic test results will improve Outcome: Progressing Goal: Respiratory complications will improve Outcome: Progressing Goal: Cardiovascular complication will  be avoided Outcome: Progressing   Problem: Activity: Goal: Risk for activity intolerance will decrease Outcome: Progressing   Problem: Nutrition: Goal: Adequate nutrition will be maintained Outcome: Progressing   Problem: Coping: Goal: Level of anxiety will decrease Outcome: Progressing   Problem: Elimination: Goal: Will not experience complications related to bowel motility Outcome: Progressing Goal: Will not experience complications related to urinary retention Outcome: Progressing   Problem: Pain Managment: Goal: General experience of comfort will improve Outcome: Progressing   Problem: Safety: Goal: Ability to remain free from injury will improve Outcome: Progressing   Problem: Skin Integrity: Goal: Risk for impaired skin integrity will decrease Outcome: Progressing   Problem: Fluid Volume: Goal: Hemodynamic stability will improve Outcome: Progressing   Problem: Clinical Measurements: Goal: Diagnostic test results will improve Outcome: Progressing Goal: Signs and symptoms of infection will decrease Outcome: Progressing   Problem: Respiratory: Goal: Ability to maintain adequate ventilation will improve Outcome: Progressing   Problem: Education: Goal: Ability to describe self-care measures that may prevent or decrease complications (Diabetes Survival Skills Education) will improve Outcome: Progressing Goal: Individualized Educational Video(s) Outcome: Progressing   Problem: Cardiac: Goal: Ability to maintain an adequate cardiac output will improve Outcome: Progressing   Problem: Health Behavior/Discharge Planning: Goal: Ability to identify and utilize available resources and services will improve Outcome: Progressing Goal: Ability to manage health-related needs will improve Outcome: Progressing   Problem: Fluid Volume: Goal: Ability to achieve a balanced intake and output will improve Outcome: Progressing   Problem: Metabolic: Goal: Ability to  maintain appropriate glucose levels will improve Outcome: Progressing   Problem: Nutritional: Goal: Maintenance of adequate nutrition will improve Outcome: Progressing Goal: Maintenance of adequate weight for body size and type will improve Outcome: Progressing   Problem: Respiratory: Goal: Will regain and/or maintain adequate ventilation Outcome: Progressing   Problem: Urinary Elimination: Goal: Ability to achieve and maintain adequate renal perfusion and functioning will improve Outcome: Progressing   Problem: Education: Goal: Knowledge of disease or condition will improve Outcome: Progressing Goal: Knowledge of secondary prevention will improve (  MUST DOCUMENT ALL) Outcome: Progressing Goal: Knowledge of patient specific risk factors will improve Loraine Leriche N/A or DELETE if not current risk factor) Outcome: Progressing   Problem: Ischemic Stroke/TIA Tissue Perfusion: Goal: Complications of ischemic stroke/TIA will be minimized Outcome: Progressing   Problem: Coping: Goal: Will verbalize positive feelings about self Outcome: Progressing Goal: Will identify appropriate support needs Outcome: Progressing   Problem: Health Behavior/Discharge Planning: Goal: Ability to manage health-related needs will improve Outcome: Progressing Goal: Goals will be collaboratively established with patient/family Outcome: Progressing   Problem: Self-Care: Goal: Ability to participate in self-care as condition permits will improve Outcome: Progressing Goal: Verbalization of feelings and concerns over difficulty with self-care will improve Outcome: Progressing Goal: Ability to communicate needs accurately will improve Outcome: Progressing   Problem: Nutrition: Goal: Risk of aspiration will decrease Outcome: Progressing Goal: Dietary intake will improve Outcome: Progressing

## 2023-07-06 NOTE — Progress Notes (Signed)
PHARMACY - ANTICOAGULATION CONSULT NOTE  Pharmacy Consult for IV heparin Indication: atrial fibrillation  No Known Allergies  Patient Measurements: Height: 5\' 5"  (165.1 cm) Weight: 76.4 kg (168 lb 6.9 oz) IBW/kg (Calculated) : 61.5 Heparin Dosing Weight: 80.7 kg   Vital Signs: Temp: 97.8 F (36.6 C) (11/05 1302) BP: 129/73 (11/05 1302) Pulse Rate: 68 (11/05 1302)  Labs: Recent Labs    07/05/23 0503 07/05/23 1504 07/05/23 2325 07/06/23 0518 07/06/23 1452 07/06/23 1652  HGB 13.4  --   --  13.5  --   --   HCT 42.7  --   --  42.8  --   --   PLT 282  --   --  267  --   --   APTT  --   --   --  51*  --  140*  HEPARINUNFRC  --   --   --  >1.10*  --   --   CREATININE 1.35*   < > 1.57* 1.62* 1.57*  --    < > = values in this interval not displayed.    Estimated Creatinine Clearance: 37 mL/min (A) (by C-G formula based on SCr of 1.57 mg/dL (H)).   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   06/15/2023 at am   ergocalciferol (VITAMIN D2) 1.25 MG (50000 UT) capsule Take 50,000 Units by mouth every 30 (thirty) days. On the 7 th of every month.   06/07/2023 at am   furosemide (LASIX) 40 MG tablet Take 1 tablet (40 mg total) by mouth daily. 30 tablet  06/15/2023 at am   latanoprost (XALATAN) 0.005 % ophthalmic solution Place 1 drop into both eyes at bedtime.   06/15/2023 at pm   levETIRAcetam (KEPPRA) 500 MG tablet Take 500 mg by mouth 2 (two) times daily.   06/15/2023 at pm   metoprolol tartrate (LOPRESSOR) 25 MG tablet Take 1 tablet (25 mg total) by mouth 2 (two) times  daily.   06/15/2023 at pm   PARoxetine (PAXIL) 10 MG tablet Take 10 mg by mouth daily.   06/15/2023 at am   potassium chloride SA (K-DUR,KLOR-CON) 20 MEQ tablet Take 2 tablets (40 mEq total) by mouth daily.   06/15/2023 at am   simvastatin (ZOCOR) 20 MG tablet Take 20 mg by mouth at bedtime.   06/15/2023 at pm    Assessment: Pharmacy is consulted to start hepairn drip on 77 yo male diagnosed with atrial fibrillation. Pt has been changed to heparin drip. Last dose of apixaban at 0904 on 11/4.   Today, 07/06/23  aPTT Is 140 sec, SUPRAtherapeutic SCr 1.62 mg/dl, Crcl 36 ml/min Hgb 16.1, plt 267 No line or bleeding issues with the heparin drip, per RN. Heparin level drawn from patient's left arm and heparin gtt running in patient's right arm, per RN.   Goal of Therapy:  Heparin level 0.3-0.7 units/ml aPTT 66-102 sec  Monitor platelets by anticoagulation protocol: Yes   Plan:  Hold infusion x1 hour Decrease heparin rate to 1200 units/hr Obtain aptt  8 hours after start of rate change  Daily aPTT/HL  Will monitor using aPTT until HL and aPTT start to correlate Daily CBC  Monitor for signs and symptoms of bleeding   Cherylin Mylar, PharmD Clinical Pharmacist  11/5/20245:45 PM

## 2023-07-06 NOTE — Progress Notes (Signed)
[facility] Progress Note Patient: Eric Zamora ZOX:[id number] DOB: 01/05/1946 DOA: 06/15/2023  DOS: the patient was seen and examined on 07/06/2023  Brief hospital course: [name] is a 77 y.o. 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, CAD, dCHF, HTN, hx DVT no longer on [facility], and HLD who admitted on 10/15 for severe sepsis due to perirectal abscess. 10/15 admitted to stepdown unit. 10/17: Overnight with fever, aspiration event, new AG acidosis, transferred back to SDU for BiPAP and insulin drip 10/18: taken to OR for drainage of abscess 10/23 cardiology was consulted for new onset A-fib with RVR. 10/24 nephrology was consulted for AKI and hyponatremia.  CT head was obtained for ongoing AMS and was found to have acute/subacute right MCA territory infarct.  Neurology was also consulted. 10/25.  Palliative care was consulted. 10/31.  Anticoagulation started again. 11/1.  Second dose of Bicillin given.  Underwent MBS. 11/2.  Marinol started for poor p.o. intake. 11/4.  Found to have ileus.  GI consulted.  Recommended DNR to legal guardian. 11/5.  Two-physician DNR form was signed and sent to guardian.  Will await decision.  Assessment and Plan: Severe sepsis due to perirectal abscess present on admission. Underwent I and D on 10/18.  Penrose drain was placed and later on removed after a few days. No cultures were sent. Started on IV vancomycin, Flagyl, ceftriaxone, later on was switched to cefepime and has been on Augmentin and doxycycline.  Total antibiotic duration from 10/15 to 11/2.  2 weeks from initial I&D.  Will discontinue antibiotic at present. Monitor.   Ileus. Found to have ileus in 11/4 x-ray. CT scan confirms colonic ileus. Currently on MiraLAX. [facility] was consulted recommend conservative measures for now. Maintain K more than 4, mag more than 2. IV hydration. Switch medications to IV. Palliative care consulted  again.  Recommend DNR to legal guardian.   New onset A-fib with RVR. HOCM. Cardiology was consulted.  Was on 100 4 times daily before for metoprolol. Was on Toprol-XL 100-50/100 mg. Now due to ileus on IV Lopressor only. Was on Eliquis later on was on IV heparin and then transition back to Eliquis.  Now back on IV heparin due to ileus.   Acute metabolic encephalopathy. History of dementia. Acute right MCA stroke. Progressively worsening mentation and early hospital stay. Neurology was consulted. 10/24 CT head showed evidence of acute/subacute cortical and subcortical right MCA territory infarct. MRI brain confirmed the infarct. As per neurology recommendation a CT scan was repeated on 10/31.  Given that there was no hemorrhagic conversion neurology recommended to resume anticoagulation with Eliquis. PT OT recommended SNF. SLP following.  On dysphagia 1 diet.   History of seizures. Depakote on hold. On Keppra. EEG unremarkable. Monitor.   Moderate MR. Monitor volume status.  Dysphagia.  Due to stroke. Speech therapy following. Underwent MBS. Currently on clear liquid diet.   Was on dysphagia 1 diet.  Possible thrush. Possible pill induced gastritis. Antibiotic discontinued. Will be on fluconazole.  Until 11/7. Already has completed therapy with nystatin.  History of syphilis. RPR reactive for 2 years. Per ID patient currently being treated with Bicillin on a weekly basis. Next dose 11/8.  Last dose.   AKI on CKD 3B. Baseline around 1.3. Worsened to 1.99. Improved significantly to normal values and now stable at 1.3. For now monitoring.   Hypernatremia.  Recurrent. Likely from poor p.o. intake.   Initiated on D5  half-normal saline with potassium.   CAD Patient not on any DAPT or antiplatelet therapy secondary to being on Eliquis.   Bipolar disorder. Mentation stable.  Not on any medication.  Monitor.   Type II DM, uncontrolled with hyperglycemia. Currently  on sliding scale insulin.  Poor p.o. intake. Now appears to have ileus.  Initially had no findings. Initiated on Marinol.  Will hold.  Hypokalemia. Aggressively corrected. Will use IV potassium for now.  Metabolic acidosis. Unclear etiology but possibly related to renal function. Will monitor for now.  Anion gap normal.  Goals of care conversation. Discussed with legal guardian on phone. Patient already had poor prognosis given his large right MCA infarct with deficits and dysphagia. On top of that he had poor p.o. intake without any significant abnormality. Now appears to have severe ileus for which treatment has been initiated. This will not result in patient returning to his prior to admission status in the best case scenario. And in an event patient has a cardiac arrest conditions prognosis remains very poor to return to his prior to arrest status. Quality of life is not great and recommend legal guardian to consider DNR for the patient. Appreciate reevaluation from palliative care.   Subjective: More tired and lethargic although once awake no significant change in interaction.  No nausea no vomiting so far.  Had a BM.  Physical Exam: General: in Mild distress, No Rash Cardiovascular: S1 and S2 Present, No Murmur Respiratory: Good respiratory effort, Bilateral Air entry present. No Crackles, No wheezes Abdomen: Bowel Sound present, No tenderness, abdomen distention unchanged although not as firm as yesterday. Extremities: Left-sided edema Neuro: Alert and oriented x3, no new focal deficit, unchanged dysarthria and left-sided weakness  Data Reviewed: I have Reviewed nursing notes, Vitals, and Lab results. Since last encounter, pertinent lab results CBC and BMP   . I have ordered test including CBC and BMP  . I have discussed pt's care plan and test results with palliative care  .  Order x-ray abdomen.  Disposition: Status is: Inpatient Remains inpatient appropriate  because: Need to monitor for improvement in oral intake and progression  SCDs Start: 06/15/23 1459   Family Communication: No one at bedside Level of care: Telemetry   Vitals:   07/05/23 2042 07/06/23 0450 07/06/23 0500 07/06/23 1302  BP: (!) 143/73 138/72  129/73  Pulse: 94 61  68  Resp: 20 19  20   Temp: 97.7 F (36.5 C) 97.7 F (36.5 C)  97.8 F (36.6 C)  TempSrc: Oral Oral    SpO2: 98% 98%  96%  Weight:   76.4 kg   Height:         Author: Lynden Oxford, MD 07/06/2023 7:24 PM  Please look on [email or web address] to find out who is on call.

## 2023-07-07 ENCOUNTER — Other Ambulatory Visit: Payer: Self-pay

## 2023-07-07 DIAGNOSIS — I421 Obstructive hypertrophic cardiomyopathy: Secondary | ICD-10-CM

## 2023-07-07 DIAGNOSIS — I4891 Unspecified atrial fibrillation: Secondary | ICD-10-CM | POA: Diagnosis not present

## 2023-07-07 DIAGNOSIS — A419 Sepsis, unspecified organism: Secondary | ICD-10-CM | POA: Diagnosis not present

## 2023-07-07 DIAGNOSIS — E870 Hyperosmolality and hypernatremia: Secondary | ICD-10-CM

## 2023-07-07 DIAGNOSIS — E876 Hypokalemia: Secondary | ICD-10-CM

## 2023-07-07 DIAGNOSIS — R4182 Altered mental status, unspecified: Secondary | ICD-10-CM | POA: Diagnosis not present

## 2023-07-07 DIAGNOSIS — R1310 Dysphagia, unspecified: Secondary | ICD-10-CM | POA: Diagnosis not present

## 2023-07-07 LAB — BASIC METABOLIC PANEL
Anion gap: 8 (ref 5–15)
BUN: 25 mg/dL — ABNORMAL HIGH (ref 8–23)
CO2: 15 mmol/L — ABNORMAL LOW (ref 22–32)
Calcium: 7.9 mg/dL — ABNORMAL LOW (ref 8.9–10.3)
Chloride: 127 mmol/L — ABNORMAL HIGH (ref 98–111)
Creatinine, Ser: 1.51 mg/dL — ABNORMAL HIGH (ref 0.61–1.24)
GFR, Estimated: 47 mL/min — ABNORMAL LOW (ref >60)
Glucose, Bld: 109 mg/dL — ABNORMAL HIGH (ref 70–99)
Potassium: 2.4 mmol/L — CL (ref 3.5–5.1)
Sodium: 150 mmol/L — ABNORMAL HIGH (ref 135–145)

## 2023-07-07 LAB — CBC
HCT: 41.4 % (ref 39.0–52.0)
Hemoglobin: 12.7 g/dL — ABNORMAL LOW (ref 13.0–17.0)
MCH: 29 pg (ref 26.0–34.0)
MCHC: 30.7 g/dL (ref 30.0–36.0)
MCV: 94.5 fL (ref 80.0–100.0)
Platelets: 277 10*3/uL (ref 150–400)
RBC: 4.38 MIL/uL (ref 4.22–5.81)
RDW: 15.4 % (ref 11.5–15.5)
WBC: 7.6 10*3/uL (ref 4.0–10.5)
nRBC: 0 % (ref 0.0–0.2)

## 2023-07-07 LAB — GLUCOSE, CAPILLARY
Glucose-Capillary: 121 mg/dL — ABNORMAL HIGH (ref 70–99)
Glucose-Capillary: 127 mg/dL — ABNORMAL HIGH (ref 70–99)
Glucose-Capillary: 136 mg/dL — ABNORMAL HIGH (ref 70–99)
Glucose-Capillary: 142 mg/dL — ABNORMAL HIGH (ref 70–99)
Glucose-Capillary: 176 mg/dL — ABNORMAL HIGH (ref 70–99)
Glucose-Capillary: 96 mg/dL (ref 70–99)

## 2023-07-07 LAB — MAGNESIUM: Magnesium: 2.3 mg/dL (ref 1.7–2.4)

## 2023-07-07 LAB — APTT
aPTT: 90 s — ABNORMAL HIGH (ref 24–36)
aPTT: 71 s — ABNORMAL HIGH (ref 24–36)

## 2023-07-07 LAB — HEPARIN LEVEL (UNFRACTIONATED): Heparin Unfractionated: >1.1 [IU]/mL — ABNORMAL HIGH (ref 0.30–0.70)

## 2023-07-07 MED ORDER — METOCLOPRAMIDE HCL 5 MG/ML IJ SOLN
5.0000 mg | Freq: Three times a day (TID) | INTRAMUSCULAR | Status: DC
  Administered 2023-07-07 – 2023-07-13 (×19): 5 mg via INTRAVENOUS
  Filled 2023-07-07 (×19): qty 2

## 2023-07-07 MED ORDER — POTASSIUM CHLORIDE 2 MEQ/ML IV SOLN
INTRAVENOUS | Status: DC
  Filled 2023-07-07 (×5): qty 1000

## 2023-07-07 MED ORDER — POTASSIUM CHLORIDE 10 MEQ/100ML IV SOLN
10.0000 meq | INTRAVENOUS | Status: AC
Start: 2023-07-07 — End: 2023-07-07
  Administered 2023-07-07 (×6): 10 meq via INTRAVENOUS
  Filled 2023-07-07 (×6): qty 100

## 2023-07-07 NOTE — Progress Notes (Signed)
Physical Therapy Treatment Patient Details Name: Eric Zamora MRN: [id number] DOB: 22-Feb-1946 Today's Date: 07/07/2023   History of Present Illness Pt is 77 yo admitted with severe sepsis due to perirectal abscess.  Pt to OR on 10/18 for drainage of abscess.  Pt with increased lethargy on 10/24 , MRI Head performed and pt found to have acute infarct  right frontal operculum with moderate associated cytotoxic edema and petechial hemorrhage. Pt now found to have ileus 07/06/23.PMH: bipolar disorder, dementia, history of NSTEMI, hypertrophic cardiomyopathy    PT Comments  Pt more alert today and transferring to EOB with mod A but then still with heavy R lean.  Pt can correct briefly but then transitions back to R lean. Transferred to chair with STEDY but heavy max x 2 for STS with heavy R lean.   Continue POC.  Patient will benefit from continued inpatient follow up therapy, <3 hours/day at d/c   If plan is discharge home, recommend the following: Two people to help with walking and/or transfers;Two people to help with bathing/dressing/bathroom   Can travel by private vehicle     No  Equipment Recommendations  None recommended by PT    Recommendations for Other Services       Precautions / Restrictions Precautions Precautions: Fall Precaution Comments: monitor HR     Mobility  Bed Mobility Overal bed mobility: Needs Assistance Bed Mobility: Supine to Sit     Supine to sit: Mod assist     General bed mobility comments: Increased time, pt able to move legs toward EOB with min A, lifts trunk with min A pulling on therapist hand, but then mod A to scoot forward    Transfers Overall transfer level: Needs assistance Equipment used: Ambulation equipment used, Rolling walker (2 wheels)   Sit to Stand: Max assist, +2 physical assistance           General transfer comment: Pt iniailly more alert so attempted STS with RW but heavy max x 2 with R lean.  Switched to STEDY:  required assist positioning feet then when he stood moved feet to L side of STEDY and leaning trunk hard R.  Very difficult to get buttock tucked to get STEDY pads in place.  ONce pads in place repositioned feet and pt requiring min A to maintain balance.  In STEDY for 2-3 mins with some weight shift.  When stood from [facility] mod A x 2. Transfer via Lift Equipment: Stedy  Ambulation/Gait               General Gait Details: deferred   Stairs             Wheelchair Mobility     Tilt Bed    Modified Rankin (Stroke Patients Only) Modified Rankin (Stroke Patients Only) Pre-Morbid Rankin Score: Moderate disability Modified Rankin: Severe disability     Balance Overall balance assessment: Needs assistance Sitting-balance support: Bilateral upper extremity supported Sitting balance-Leahy Scale: Poor Sitting balance - Comments: Pt tending to lean R and mildly posterior.  Had pt reach to targets on L with R hand.  Pt able to get targets and balance while reaching but once hands returned back to bed leans to R.  Requiring mod A.  At EOB 5 mins.     Standing balance-Leahy Scale: Poor Standing balance comment: Stood in STEDY for 2-3 mins with min A  Cognition Arousal: Alert Behavior During Therapy: WFL for tasks assessed/performed Overall Cognitive Status: No family/caregiver present to determine baseline cognitive functioning                                 General Comments: Pt more alert today.  Speech still dysarthric but somewhat clearer today.  Follows 1-2 step commands.        Exercises      General Comments General comments (skin integrity, edema, etc.): HR 110's-120's      Pertinent Vitals/Pain Pain Assessment Pain Assessment: Faces Faces Pain Scale: Hurts a little bit    Home Living                          Prior Function            PT Goals (current goals can now be found in the care  plan section) Progress towards PT goals: Progressing toward goals    Frequency    Min 1X/week      PT Plan      Co-evaluation              AM-PAC PT "6 Clicks" Mobility   Outcome Measure  Help needed turning from your back to your side while in a flat bed without using bedrails?: A Lot Help needed moving from lying on your back to sitting on the side of a flat bed without using bedrails?: A Lot Help needed moving to and from a bed to a chair (including a wheelchair)?: Total Help needed standing up from a chair using your arms (e.g., wheelchair or bedside chair)?: Total Help needed to walk in hospital room?: Total Help needed climbing 3-5 steps with a railing? : Total 6 Click Score: 8    End of Session Equipment Utilized During Treatment: Gait belt Activity Tolerance: Patient tolerated treatment well Patient left: with call bell/phone within reach;with chair alarm set;in chair Nurse Communication: Mobility status;Need for lift equipment (maxi move) PT Visit Diagnosis: Other abnormalities of gait and mobility (R26.89);Muscle weakness (generalized) (M62.81)     Time: 0454-0981 PT Time Calculation (min) (ACUTE ONLY): 23 min  Charges:    $Neuromuscular Re-education: 8-22 mins PT General Charges $$ ACUTE PT VISIT: 1 Visit                     Anise Salvo, PT Acute Rehab [facility] [phone]    Rayetta Humphrey 07/07/2023, 12:45 PM

## 2023-07-07 NOTE — Progress Notes (Signed)
PROGRESS NOTE    Eric Zamora  XBM:[id number] DOB: February 13, 1946 DOA: 06/15/2023 PCP: Uvaldo Bristle, PA-C    Chief Complaint  Patient presents with   Altered Mental Status    Brief Narrative:  Eric Zamora is a 77 y.o. 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 10/15 admitted to stepdown unit. 10/17: Overnight with fever, aspiration event, new AG acidosis, transferred back to SDU for BiPAP and insulin drip 10/18: taken to OR for drainage of abscess 10/23 cardiology was consulted for new onset A-fib with RVR. 10/24 nephrology was consulted for AKI and hyponatremia.  CT head was obtained for ongoing AMS and was found to have acute/subacute right MCA territory infarct.  Neurology was also consulted. 10/25.  Palliative care was consulted. 10/31.  Anticoagulation started again. 11/1.  Second dose of Bicillin given.  Underwent MBS. 11/2.  Marinol started for poor p.o. intake. 11/4.  Found to have ileus.  GI consulted.  Recommended DNR to legal guardian. 11/5.  Two-physician DNR form was signed and sent to guardian.  Will await decision.   Assessment & Plan:   Principal Problem:   Severe sepsis due to perirectal abscess(HCC) Active Problems:   Atrial fibrillation with rapid ventricular response (HCC)   Diabetic ketoacidosis without coma associated with type 2 diabetes mellitus (HCC)   Acute renal failure superimposed on stage 3b chronic kidney disease (HCC)   Dysphagia   CAD- Moderate LAD disease with moderate to severe ostial first diagonal disease by cath 07/2013 - not ammendable to PCI. 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 -Penrose drain placed and later removed.  No cultures were sent. -Patient noted to remain on IV vancomycin, Flagyl, Rocephin subsequently transition to cefepime and has been transition to Augmentin and doxycycline and recommending a total of 2 weeks of antibiotic treatment from initial I&D. -Antibiotic course completed. -Supportive care.  2.  Colonic ileus -Noted on x-ray 07/05/2023 and subsequently confirmed on CT scans. -Patient seen in consultation by GI who recommended conservative measures for now with repletion of electrolytes to keep potassium approximately 4, magnesium approximately 2. -Patient currently on MiraLAX. -Placed on Reglan 5 mg IV every 8 hours. -IV fluids. -Medications changed to IV. -Palliative care consulted and DNR recommended to legal guardian. -Frequent turns.  3.  New onset A-fib with RVR/HOCM -Patient during the hospitalization to have new onset A-fib with RVR with CHADS2Vasc score of 4. -Patient seen in consultation by cardiology was on metoprolol 100 mg 4 times daily subsequently changed to Toprol-XL. -Patient placed back on IV Lopressor due to colonic ileus. -Was on Eliquis for anticoagulation now on IV heparin due to ileus. -Likely resume  Eliquis once ileus has resolved.  4.  Acute metabolic encephalopathy/acute right MCA stroke/history of dementia -Patient noted to have a progressive worsening mentation early on in the hospitalization. -CT head  done 06/24/2023 with acute/subacute cortical and subcortical right MCA territory infarct. -MRI brain done consistent with acute infarct. -MRA brain done with no acute abnormalities. -Carotid Dopplers done with no significant ICA stenosis. -EEG done negative for seizures. -Repeat head CT done on 07/01/2023 per neurology recommendations showed no hemorrhagic conversion and as such neurology recommended resumption of anticoagulation with Eliquis. -Patient being followed by SLP was on dysphagia 1 diet however currently on clear liquids due to colonic ileus. -PT/OT following and recommending SNF placement.  5.  History of seizures -EEG done negative for seizures/epileptiform activity. -Depakote on hold due to hepatic function. -Was on Keppra which is currently on hold. -Continue Keppra.  6.  Moderate MR -Monitor volume status.  7.  Dysphagia -Secondary to acute CVA. -Being followed by SLP, underwent MBS was on a dysphagia 1 diet and currently on clear liquids due to colonic ileus.   -Once ileus resolved will resume dysphagia 1 diet.  8.  Hypokalemia -Potassium at 2.4 this morning, magnesium at 2.3. -Change IV fluids to D5W with KCl 40 mEq at 125 cc an hour. -KCl 10 mEq IV every hour x 6 rounds. -Repeat labs in the AM.  9.  Hypernatremia -Likely secondary to hypovolemic hypernatremia secondary to dehydration. -Change IV fluids to D5W. -Repeat labs in the AM.  10.  Possible thrush/possible pill induced gastritis -Patient completed course of antibiotics. -Currently on Diflucan through 07/08/2023. -Status post completion of nystatin swish and swallow.  11.  History of syphilis -RPR reactive for 2 years. -Dr. Allena Katz discussed with ID and patient currently being treated with Bicillin on a weekly basis.  With next dose 07/09/2023.  12.  AKI on CKD 3A -Baseline creatinine approximately 1.3 and noted to have worsening to 1.99. -Improved with hydration creatinine currently at 1.51. -Being  hydrated IV fluids on D5W.  13.  None anion gap metabolic acidosis -Likely secondary to CKD. -Repeat labs in the a.m. and if still acidotic may need to consider placing on a bicarb drip.  14.  CAD -Patient not on DAPT or antiplatelets secondary to being on Eliquis. -Outpatient follow-up with cardiology.  15.  Bipolar disorder -Not on any medications prior to admission. -Stable.  16.  Poor oral intake -Likely multifactorial secondary to acute CVA and colonic ileus. -Was initially placed on Marinol which is currently on hold.  17.  Goals of care conversation -Dr. Allena Katz discussed with legal guardian on the phone. -Patient noted with poor prognosis due to large right MCA infarct with deficits and dysphagia with dysarthria. -Patient noted with poor oral intake and now with colonic ileus currently under conservative treatment. -It is felt with acute CVA patient likely will not return to his prior status prior to admission. -It is felt that the patient has a cardiac arrest prognosis remains very poor to return to his prior status prior to arrest. -Patient with poor quality of life and Dr. Allena Katz recommended legal guardian to consider DNR for patient. -Palliative care following.     DVT prophylaxis: Heparin Code Status: Full Family Communication: Updated patient.  No family at bedside. Disposition: SNF once ileus has resolved and clinically improved.  Status is: Inpatient Remains inpatient appropriate because: Severity of illness   Consultants:  General surgery: Dr. Derrell Lolling 06/18/2023 Gastroenterology: Dr. Ewing Schlein 07/05/2023 Cardiology: Dr. Royann Shivers 06/23/2023 Nephrology: Dr.  Bhandari 06/24/2023 Neurology: Dr.Bhagat 06/24/2023 Palliative care: Dr. Patterson Hammersmith 06/25/2023  Procedures:  CT abdomen and pelvis 07/05/2023 CT head 06/15/2023, 06/24/2023, 07/01/2023 Chest x-ray 06/15/2023, 06/17/2023 Modified barium swallow 06/19/2023 Abdominal films 07/05/2023, 07/06/2023 MRI brain  06/25/2023 MRA head 06/25/2023 Right upper quadrant ultrasound 06/22/2023 2D echo 06/16/2023 Carotid Dopplers 06/25/2023 CT chest abdomen and pelvis 06/17/2023 EEG 06/25/2023 Irrigation and debridement perirectal abscess per general surgery: Dr. Derrell Lolling 06/18/2023  Antimicrobials:  Anti-infectives (From admission, onward)    Start     Dose/Rate Route Frequency Ordered Stop   07/05/23 1600  fluconazole (DIFLUCAN) IVPB 200 mg        200 mg 100 mL/hr over 60 Minutes Intravenous Every 24 hours 07/05/23 1320 07/09/23 1559   07/02/23 1000  fluconazole (DIFLUCAN) tablet 100 mg  Status:  Discontinued        100 mg Oral Daily 07/02/23 0802 07/05/23 1320   07/02/23 0800  penicillin g benzathine (BICILLIN LA) 1200000 UNIT/2ML injection 2.4 Million Units        2.4 Million Units Intramuscular Weekly 07/01/23 1209 07/16/23 0759   06/26/23 1500  penicillin g benzathine (BICILLIN LA) 1200000 UNIT/2ML injection 2.4 Million Units        2.4 Million Units Intramuscular  Once 06/26/23 1351 06/28/23 0018   06/24/23 2200  amoxicillin-clavulanate (AUGMENTIN) 500-125 MG per tablet 1 tablet  Status:  Discontinued        1 tablet Oral Every 12 hours 06/24/23 1450 07/03/23 1202   06/24/23 2200  amoxicillin-clavulanate (AUGMENTIN) 500-125 MG per tablet 1 tablet  Status:  Discontinued        1 tablet Oral 2 times daily 06/24/23 1451 06/24/23 1451   06/21/23 1215  doxycycline (VIBRA-TABS) tablet 100 mg  Status:  Discontinued        100 mg Oral Every 12 hours 06/21/23 1128 07/03/23 1202   06/21/23 1215  amoxicillin-clavulanate (AUGMENTIN) 875-125 MG per tablet 1 tablet  Status:  Discontinued        1 tablet Oral Every 12 hours 06/21/23 1128 06/24/23 1450   06/20/23 1200  vancomycin (VANCOREADY) IVPB 1500 mg/300 mL  Status:  Discontinued        1,500 mg 150 mL/hr over 120 Minutes Intravenous Every 36 hours 06/20/23 0719 06/21/23 1128   06/19/23 0000  vancomycin (VANCOREADY) IVPB 1250 mg/250 mL  Status:   Discontinued        1,250 mg 166.7 mL/hr over 90 Minutes Intravenous Every 36 hours 06/18/23 0939 06/20/23 0719   06/18/23 1800  ceFEPIme (MAXIPIME) 2 g in sodium chloride 0.9 % 100 mL IVPB  Status:  Discontinued        2 g 200 mL/hr over 30 Minutes Intravenous Every 12 hours 06/18/23 0939 06/21/23 1128   06/18/23 0600  ceFEPIme (MAXIPIME) 2 g in sodium chloride 0.9 % 100 mL IVPB  Status:  Discontinued        2 g 200 mL/hr over 30 Minutes Intravenous Every 24 hours 06/17/23 0920 06/18/23 0939   06/17/23 1400  vancomycin (VANCOCIN) IVPB 1000 mg/200 mL premix  Status:  Discontinued        1,000 mg 200 mL/hr over 60 Minutes Intravenous Every 36 hours 06/17/23 0920 06/18/23 0939   06/16/23 1800  vancomycin (VANCOREADY) IVPB 750 mg/150 mL  Status:  Discontinued        750 mg 150 mL/hr over 60 Minutes Intravenous Every 24 hours 06/15/23 1639 06/17/23 0920   06/16/23 1100  cefTRIAXone (ROCEPHIN) 2 g in sodium chloride 0.9 %  100 mL IVPB  Status:  Discontinued        2 g 200 mL/hr over 30 Minutes Intravenous Every 24 hours 06/15/23 1453 06/15/23 1455   06/15/23 1800  ceFEPIme (MAXIPIME) 2 g in sodium chloride 0.9 % 100 mL IVPB  Status:  Discontinued        2 g 200 mL/hr over 30 Minutes Intravenous Every 12 hours 06/15/23 1558 06/17/23 0920   06/15/23 1700  metroNIDAZOLE (FLAGYL) IVPB 500 mg  Status:  Discontinued        500 mg 100 mL/hr over 60 Minutes Intravenous Every 12 hours 06/15/23 1455 06/21/23 1128   06/15/23 1630  vancomycin (VANCOREADY) IVPB 1500 mg/300 mL        1,500 mg 150 mL/hr over 120 Minutes Intravenous  Once 06/15/23 1558 06/15/23 2058   06/15/23 1045  cefTRIAXone (ROCEPHIN) 2 g in sodium chloride 0.9 % 100 mL IVPB        2 g 200 mL/hr over 30 Minutes Intravenous  Once 06/15/23 1041 06/15/23 1155         Subjective: Patient laying in bed sleeping but easily arousable.  Following commands appropriately.  Some dysarthric speech.  Denies any chest pain or shortness of  breath.  Patient passing gas.  Patient per RN with some liquid stools mainly mucus in nature.  Objective: Vitals:   07/07/23 0049 07/07/23 0451 07/07/23 0707 07/07/23 1230  BP: 127/63 128/82  135/88  Pulse: (!) 52 (!) 54  77  Resp: 18 18  17   Temp:  97.7 F (36.5 C)  98.4 F (36.9 C)  TempSrc:  Oral  Oral  SpO2: 100% 99%  100%  Weight:   80.1 kg   Height:        Intake/Output Summary (Last 24 hours) at 07/07/2023 1801 Last data filed at 07/07/2023 1506 Gross per 24 hour  Intake 3105.72 ml  Output 650 ml  Net 2455.72 ml   Filed Weights   07/04/23 0141 07/06/23 0500 07/07/23 0707  Weight: 80.7 kg 76.4 kg 80.1 kg    Examination:  General exam: Appears calm and comfortable  Respiratory system: Clear to auscultation anterior lung fields.  No wheezes, no crackles, normal rhonchi.  Fair air movement.Marland Kitchen Respiratory effort normal. Cardiovascular system: S1 & S2 heard, RRR. No JVD, murmurs, rubs, gallops or clicks. No pedal edema. Gastrointestinal system: Abdomen is obese, mildly distended, soft, positive bowel sounds.  No significant tenderness to palpation.  No rebound.  No guarding.   Central nervous system: Alert and oriented.  Dysarthric speech.  Some left facial weakness.  4/5 bilateral upper extremity strength.  4/5 bilateral lower extremity strength.   Extremities: Symmetric 5 x 5 power. Skin: No rashes, lesions or ulcers Psychiatry: Judgement and insight appear normal. Mood & affect appropriate.     Data Reviewed: I have personally reviewed following labs and imaging studies  CBC: Recent Labs  Lab 07/01/23 0520 07/02/23 0539 07/03/23 0615 07/05/23 0503 07/06/23 0518 07/07/23 0700  WBC 7.3 7.9 7.8 8.7 7.5 7.6  NEUTROABS 4.2 5.1 5.3  --   --   --   HGB 12.9* 12.3* 11.9* 13.4 13.5 12.7*  HCT 40.3 38.5* 37.5* 42.7 42.8 41.4  MCV 90.2 91.4 90.1 92.2 91.6 94.5  PLT 183 184 201 282 267 277    Basic Metabolic Panel: Recent Labs  Lab 07/04/23 0808 07/04/23 1644  07/05/23 0503 07/05/23 1504 07/05/23 2325 07/06/23 0518 07/06/23 1452 07/06/23 2240 07/07/23 0523 07/07/23 0700  NA 144   < >  145   < > 149* 149* 148* 151*  --  150*  K 2.7*   < > 2.9*   < > 3.1* 2.6* 4.0 2.8*  --  2.4*  CL 114*   < > 116*   < > 129* 119* 124* 128*  --  127*  CO2 19*   < > 18*   < > 13* 17* 16* 15*  --  15*  GLUCOSE 158*   < > 203*   < > 136* 124* 131* 130*  --  109*  BUN 27*   < > 30*   < > 31* 32* 31* 27*  --  25*  CREATININE 1.27*   < > 1.35*   < > 1.57* 1.62* 1.57* 1.45*  --  1.51*  CALCIUM 8.8*   < > 9.0   < > 8.1* 8.9 8.2* 7.9*  --  7.9*  MG 2.5*  --  2.6*  --   --  2.7*  --   --  2.3  --    < > = values in this interval not displayed.    GFR: Estimated Creatinine Clearance: 39.3 mL/min (A) (by C-G formula based on SCr of 1.51 mg/dL (H)).  Liver Function Tests: Recent Labs  Lab 07/01/23 0520 07/02/23 0539 07/03/23 0615  AST 41 33 30  ALT 34 28 24  ALKPHOS 32* 31* 31*  BILITOT 1.1 1.0 0.7  PROT 7.1 6.9 6.8  ALBUMIN 2.7* 2.6* 2.6*    CBG: Recent Labs  Lab 07/07/23 0037 07/07/23 0450 07/07/23 0735 07/07/23 1231 07/07/23 1622  GLUCAP 121* 127* 96 136* 176*     No results found for this or any previous visit (from the past 240 hour(s)).       Radiology Studies: [place] EKG SITE RITE  Result Date: 07/07/2023 If Site Rite image not attached, placement could not be confirmed due to current cardiac rhythm.  DG Abd Portable 1V  Result Date: 07/06/2023 CLINICAL DATA:  Ileus. EXAM: PORTABLE ABDOMEN - 1 VIEW COMPARISON:  Abdominal x-ray and CT abdomen pelvis from yesterday. FINDINGS: Continued but mildly improved diffuse colonic distention. No dilated small bowel loops. No acute osseous abnormality. IMPRESSION: 1. Continued but mildly improved colonic ileus. Electronically Signed   By: Obie Dredge M.D.   On: 07/06/2023 12:47        Scheduled Meds:  insulin aspart  0-15 Units Subcutaneous Q4H   insulin glargine-yfgn  8 Units Subcutaneous  QHS   magic mouthwash  10 mL Oral QID   metoCLOPramide (REGLAN) injection  5 mg Intravenous Q8H   metoprolol tartrate  5 mg Intravenous Q6H   mouth rinse  15 mL Mouth Rinse 4 times per day   pantoprazole (PROTONIX) IV  40 mg Intravenous Q24H   penicillin g benzathine (BICILLIN-LA) IM  2.4 Million Units Intramuscular Weekly   polyethylene glycol  17 g Oral BID   sodium chloride flush  3 mL Intravenous Q12H   Continuous Infusions:  dextrose 5 % 1,000 mL with potassium chloride 40 mEq infusion 125 mL/hr at 07/07/23 1506   fluconazole (DIFLUCAN) IV 200 mg (07/07/23 1656)   heparin 1,200 Units/hr (07/07/23 1506)     LOS: 22 days    Time spent: 40 minutes    Ramiro Harvest, MD [facility]   To contact the attending provider between 7A-7P or the covering provider during after hours 7P-7A, please log into the web site [email or web address] and access using universal [place] password for that web site. If  you do not have the password, please call the hospital operator.  07/07/2023, 6:01 PM

## 2023-07-07 NOTE — Plan of Care (Signed)
Problem: Education: Goal: Ability to describe self-care measures that may prevent or decrease complications (Diabetes Survival Skills Education) will improve Outcome: Progressing Goal: Individualized Educational Video(s) Outcome: Progressing   Problem: Coping: Goal: Ability to adjust to condition or change in health will improve Outcome: Progressing   Problem: Fluid Volume: Goal: Ability to maintain a balanced intake and output will improve Outcome: Progressing   Problem: Health Behavior/Discharge Planning: Goal: Ability to identify and utilize available resources and services will improve Outcome: Progressing Goal: Ability to manage health-related needs will improve Outcome: Progressing   Problem: Metabolic: Goal: Ability to maintain appropriate glucose levels will improve Outcome: Progressing   Problem: Nutritional: Goal: Maintenance of adequate nutrition will improve Outcome: Progressing Goal: Progress toward achieving an optimal weight will improve Outcome: Progressing   Problem: Skin Integrity: Goal: Risk for impaired skin integrity will decrease Outcome: Progressing   Problem: Tissue Perfusion: Goal: Adequacy of tissue perfusion will improve Outcome: Progressing   Problem: Education: Goal: Knowledge of General Education information will improve Description: Including pain rating scale, medication(s)/side effects and non-pharmacologic comfort measures Outcome: Progressing   Problem: Health Behavior/Discharge Planning: Goal: Ability to manage health-related needs will improve Outcome: Progressing   Problem: Clinical Measurements: Goal: Ability to maintain clinical measurements within normal limits will improve Outcome: Progressing Goal: Will remain free from infection Outcome: Progressing Goal: Diagnostic test results will improve Outcome: Progressing Goal: Respiratory complications will improve Outcome: Progressing Goal: Cardiovascular complication will  be avoided Outcome: Progressing   Problem: Activity: Goal: Risk for activity intolerance will decrease Outcome: Progressing   Problem: Nutrition: Goal: Adequate nutrition will be maintained Outcome: Progressing   Problem: Coping: Goal: Level of anxiety will decrease Outcome: Progressing   Problem: Elimination: Goal: Will not experience complications related to bowel motility Outcome: Progressing Goal: Will not experience complications related to urinary retention Outcome: Progressing   Problem: Pain Managment: Goal: General experience of comfort will improve Outcome: Progressing   Problem: Safety: Goal: Ability to remain free from injury will improve Outcome: Progressing   Problem: Skin Integrity: Goal: Risk for impaired skin integrity will decrease Outcome: Progressing   Problem: Fluid Volume: Goal: Hemodynamic stability will improve Outcome: Progressing   Problem: Clinical Measurements: Goal: Diagnostic test results will improve Outcome: Progressing Goal: Signs and symptoms of infection will decrease Outcome: Progressing   Problem: Respiratory: Goal: Ability to maintain adequate ventilation will improve Outcome: Progressing   Problem: Education: Goal: Ability to describe self-care measures that may prevent or decrease complications (Diabetes Survival Skills Education) will improve Outcome: Progressing Goal: Individualized Educational Video(s) Outcome: Progressing   Problem: Cardiac: Goal: Ability to maintain an adequate cardiac output will improve Outcome: Progressing   Problem: Health Behavior/Discharge Planning: Goal: Ability to identify and utilize available resources and services will improve Outcome: Progressing Goal: Ability to manage health-related needs will improve Outcome: Progressing   Problem: Fluid Volume: Goal: Ability to achieve a balanced intake and output will improve Outcome: Progressing   Problem: Metabolic: Goal: Ability to  maintain appropriate glucose levels will improve Outcome: Progressing   Problem: Nutritional: Goal: Maintenance of adequate nutrition will improve Outcome: Progressing Goal: Maintenance of adequate weight for body size and type will improve Outcome: Progressing   Problem: Respiratory: Goal: Will regain and/or maintain adequate ventilation Outcome: Progressing   Problem: Urinary Elimination: Goal: Ability to achieve and maintain adequate renal perfusion and functioning will improve Outcome: Progressing   Problem: Education: Goal: Knowledge of disease or condition will improve Outcome: Progressing Goal: Knowledge of secondary prevention will improve (  MUST DOCUMENT ALL) Outcome: Progressing Goal: Knowledge of patient specific risk factors will improve Loraine Leriche N/A or DELETE if not current risk factor) Outcome: Progressing   Problem: Ischemic Stroke/TIA Tissue Perfusion: Goal: Complications of ischemic stroke/TIA will be minimized Outcome: Progressing

## 2023-07-07 NOTE — Progress Notes (Addendum)
Pharmacy Brief Note - Evening Anticoagulation Follow Up:  Patient is a 10 yoM who is currently on heparin for atrial fibrillation due to inability to take PO medications with ileus. At this time, still monitoring with aPTT. For full history, see note by Terrilee Files, PharmD from earlier today.   There have been numerous unsuccessful attempts to draw the confirmatory aPTT this afternoon. PICC line ordered with plans for aPTT/heparin level to be drawn from the PICC.  Goal: Heparin level 0.3 - 0.7, aPTT 66 - 102 seconds  Plan: PICC not yet placed. Ordering STAT APTT to retry peripheral stick. IV team consult entered for STAT aPTT once PICC placed. Confirmed with RN - pt not having any signs of bleeding at this time.   Confirmed with Dr. Janee Morn that plan will be to continue UFH even if PICC is unable to be placed today for lab monitoring.   Cindi Carbon, PharmD 07/07/23 3:58 PM  Addendum: Confirmatory aPTT drawn @ 20:37 = 71 seconds remains therapeutic on heparin infusion of 1200 units/hr  Plan:  -Continue heparin at 1200 units/hr -Check CBC, heparin level, aPTT with AM labs tomorrow -Monitor for signs of bleeding  Cindi Carbon, PharmD 07/07/23 9:04 PM

## 2023-07-07 NOTE — Progress Notes (Signed)
PHARMACY - ANTICOAGULATION CONSULT NOTE  Pharmacy Consult for IV heparin Indication: atrial fibrillation  No Known Allergies  Patient Measurements: Height: 5\' 5"  (165.1 cm) Weight: 76.4 kg (168 lb 6.9 oz) IBW/kg (Calculated) : 61.5 Heparin Dosing Weight: 80.7 kg   Vital Signs: Temp: 97.7 F (36.5 C) (11/06 0451) Temp Source: Oral (11/06 0451) BP: 128/82 (11/06 0451) Pulse Rate: 54 (11/06 0451)  Labs: Recent Labs    07/05/23 0503 07/05/23 1504 07/06/23 0518 07/06/23 1452 07/06/23 1652 07/06/23 2240 07/07/23 0523  HGB 13.4  --  13.5  --   --   --   --   HCT 42.7  --  42.8  --   --   --   --   PLT 282  --  267  --   --   --   --   APTT  --   --  51*  --  140*  --  90*  HEPARINUNFRC  --   --  >1.10*  --   --   --  >1.10*  CREATININE 1.35*   < > 1.62* 1.57*  --  1.45*  --    < > = values in this interval not displayed.    Estimated Creatinine Clearance: 40.1 mL/min (A) (by C-G formula based on SCr of 1.45 mg/dL (H)).   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   06/15/2023 at am   ergocalciferol (VITAMIN D2) 1.25 MG (50000 UT) capsule Take 50,000 Units by mouth every 30 (thirty) days. On the 7 th of every month.   06/07/2023 at am   furosemide (LASIX) 40 MG tablet Take 1 tablet (40 mg total) by mouth daily. 30 tablet  06/15/2023 at am   latanoprost (XALATAN) 0.005 % ophthalmic solution Place 1 drop into both eyes at bedtime.   06/15/2023 at pm   levETIRAcetam (KEPPRA) 500 MG tablet Take 500 mg by mouth 2 (two) times daily.   06/15/2023 at pm   metoprolol  tartrate (LOPRESSOR) 25 MG tablet Take 1 tablet (25 mg total) by mouth 2 (two) times daily.   06/15/2023 at pm   PARoxetine (PAXIL) 10 MG tablet Take 10 mg by mouth daily.   06/15/2023 at am   potassium chloride SA (K-DUR,KLOR-CON) 20 MEQ tablet Take 2 tablets (40 mEq total) by mouth daily.   06/15/2023 at am   simvastatin (ZOCOR) 20 MG tablet Take 20 mg by mouth at bedtime.   06/15/2023 at pm    Assessment: Pharmacy is consulted to start hepairn drip on 77 yo male diagnosed with atrial fibrillation. Pt has been changed to heparin drip. Last dose of apixaban at 0904 on 11/4.   Today, 07/07/23  aPTT Is 90 sec (therapeutic) with heparin gtt @ 1200 units/hr Heparin level > 1.1, remains falsely elevated due to recent DOAC use CBC not yet resulted No complications of therapy noted  Goal of Therapy:  Heparin level 0.3-0.7 units/ml aPTT 66-102 sec  Monitor platelets by anticoagulation protocol: Yes   Plan:  Continue heparin rate @  1200 units/hr Repeat aptt in 8 hours to confirm therapeutic dose Daily aPTT/HL  Will monitor using aPTT until HL and aPTT start to correlate Daily CBC  Monitor for signs and symptoms of bleeding  Terrilee Files, PharmD 11/6/20246:25 AM

## 2023-07-08 ENCOUNTER — Inpatient Hospital Stay (HOSPITAL_COMMUNITY): Payer: Medicare (Managed Care)

## 2023-07-08 DIAGNOSIS — K567 Ileus, unspecified: Secondary | ICD-10-CM

## 2023-07-08 DIAGNOSIS — A419 Sepsis, unspecified organism: Secondary | ICD-10-CM | POA: Diagnosis not present

## 2023-07-08 DIAGNOSIS — R1310 Dysphagia, unspecified: Secondary | ICD-10-CM | POA: Diagnosis not present

## 2023-07-08 DIAGNOSIS — I4891 Unspecified atrial fibrillation: Secondary | ICD-10-CM | POA: Diagnosis not present

## 2023-07-08 DIAGNOSIS — R4182 Altered mental status, unspecified: Secondary | ICD-10-CM | POA: Diagnosis not present

## 2023-07-08 LAB — MAGNESIUM: Magnesium: 2.4 mg/dL (ref 1.7–2.4)

## 2023-07-08 LAB — CBC
HCT: 37.3 % — ABNORMAL LOW (ref 39.0–52.0)
Hemoglobin: 11.6 g/dL — ABNORMAL LOW (ref 13.0–17.0)
MCH: 28.9 pg (ref 26.0–34.0)
MCHC: 31.1 g/dL (ref 30.0–36.0)
MCV: 92.8 fL (ref 80.0–100.0)
Platelets: 231 10*3/uL (ref 150–400)
RBC: 4.02 MIL/uL — ABNORMAL LOW (ref 4.22–5.81)
RDW: 15.6 % — ABNORMAL HIGH (ref 11.5–15.5)
WBC: 7.3 10*3/uL (ref 4.0–10.5)
nRBC: 0 % (ref 0.0–0.2)

## 2023-07-08 LAB — COMPREHENSIVE METABOLIC PANEL
ALT: 24 U/L (ref 0–44)
AST: 25 U/L (ref 15–41)
Albumin: 2.5 g/dL — ABNORMAL LOW (ref 3.5–5.0)
Alkaline Phosphatase: 35 U/L — ABNORMAL LOW (ref 38–126)
Anion gap: 10 (ref 5–15)
BUN: 17 mg/dL (ref 8–23)
CO2: 14 mmol/L — ABNORMAL LOW (ref 22–32)
Calcium: 7.6 mg/dL — ABNORMAL LOW (ref 8.9–10.3)
Chloride: 119 mmol/L — ABNORMAL HIGH (ref 98–111)
Creatinine, Ser: 1.48 mg/dL — ABNORMAL HIGH (ref 0.61–1.24)
GFR, Estimated: 48 mL/min — ABNORMAL LOW (ref >60)
Glucose, Bld: 121 mg/dL — ABNORMAL HIGH (ref 70–99)
Potassium: 2.7 mmol/L — CL (ref 3.5–5.1)
Sodium: 143 mmol/L (ref 135–145)
Total Bilirubin: 0.6 mg/dL (ref <1.2)
Total Protein: 6.3 g/dL — ABNORMAL LOW (ref 6.5–8.1)

## 2023-07-08 LAB — GLUCOSE, CAPILLARY
Glucose-Capillary: 105 mg/dL — ABNORMAL HIGH (ref 70–99)
Glucose-Capillary: 118 mg/dL — ABNORMAL HIGH (ref 70–99)
Glucose-Capillary: 121 mg/dL — ABNORMAL HIGH (ref 70–99)
Glucose-Capillary: 134 mg/dL — ABNORMAL HIGH (ref 70–99)
Glucose-Capillary: 142 mg/dL — ABNORMAL HIGH (ref 70–99)
Glucose-Capillary: 99 mg/dL (ref 70–99)

## 2023-07-08 LAB — APTT
aPTT: 184 s (ref 24–36)
aPTT: 188 s (ref 24–36)

## 2023-07-08 LAB — HEPARIN LEVEL (UNFRACTIONATED): Heparin Unfractionated: >1.1 [IU]/mL — ABNORMAL HIGH (ref 0.30–0.70)

## 2023-07-08 MED ORDER — POTASSIUM CHLORIDE 10 MEQ/100ML IV SOLN
10.0000 meq | INTRAVENOUS | Status: AC
  Administered 2023-07-08 (×6): 10 meq via INTRAVENOUS
  Filled 2023-07-08 (×6): qty 100

## 2023-07-08 MED ORDER — SODIUM CHLORIDE 0.9% FLUSH
10.0000 mL | INTRAVENOUS | Status: DC | PRN
  Administered 2023-07-23: 20 mL

## 2023-07-08 MED ORDER — CHLORHEXIDINE GLUCONATE CLOTH 2 % EX PADS
6.0000 | MEDICATED_PAD | Freq: Every day | CUTANEOUS | Status: DC
  Administered 2023-07-08 – 2023-08-02 (×26): 6 via TOPICAL

## 2023-07-08 MED ORDER — SODIUM BICARBONATE 8.4 % IV SOLN
INTRAVENOUS | Status: DC
  Filled 2023-07-08 (×2): qty 1000
  Filled 2023-07-08: qty 150
  Filled 2023-07-08: qty 1000
  Filled 2023-07-08: qty 150
  Filled 2023-07-08 (×2): qty 1000

## 2023-07-08 MED ORDER — POTASSIUM CHLORIDE CRYS ER 20 MEQ PO TBCR
40.0000 meq | EXTENDED_RELEASE_TABLET | Freq: Two times a day (BID) | ORAL | Status: AC
  Administered 2023-07-08 (×2): 40 meq via ORAL
  Filled 2023-07-08 (×2): qty 2

## 2023-07-08 MED ORDER — HEPARIN (PORCINE) 25000 UT/250ML-% IV SOLN
1100.0000 [IU]/h | INTRAVENOUS | Status: DC
  Administered 2023-07-08: 1100 [IU]/h via INTRAVENOUS
  Filled 2023-07-08: qty 250

## 2023-07-08 NOTE — Progress Notes (Signed)
PHARMACY - ANTICOAGULATION CONSULT NOTE  Pharmacy Consult for IV heparin Indication: atrial fibrillation  No Known Allergies  Patient Measurements: Height: 5\' 5"  (165.1 cm) Weight: 81.8 kg (180 lb 5.4 oz) IBW/kg (Calculated) : 61.5 Heparin Dosing Weight: 80.7 kg   Vital Signs: Temp: 97.8 F (36.6 C) (11/07 0523) Temp Source: Oral (11/07 0523) BP: 103/76 (11/07 0523) Pulse Rate: 91 (11/07 0523)  Labs: Recent Labs    07/06/23 0518 07/06/23 1452 07/06/23 2240 07/07/23 0523 07/07/23 0700 07/07/23 2037 07/08/23 0548 07/08/23 0933  HGB 13.5  --   --   --  12.7*  --  11.6*  --   HCT 42.8  --   --   --  41.4  --  37.3*  --   PLT 267  --   --   --  277  --  231  --   APTT 51*   < >  --  90*  --  71* 184* 188*  HEPARINUNFRC >1.10*  --   --  >1.10*  --   --  >1.10*  --   CREATININE 1.62*   < > 1.45*  --  1.51*  --  1.48*  --    < > = values in this interval not displayed.    Estimated Creatinine Clearance: 40.5 mL/min (A) (by C-G formula based on SCr of 1.48 mg/dL (H)).   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   06/15/2023 at am   ergocalciferol (VITAMIN D2) 1.25 MG (50000 UT) capsule Take 50,000 Units by mouth every 30 (thirty) days. On the 7 th of every month.   06/07/2023 at am   furosemide (LASIX) 40 MG tablet Take 1 tablet (40 mg total) by mouth daily. 30 tablet  06/15/2023 at am   latanoprost (XALATAN) 0.005 % ophthalmic solution Place 1 drop into both eyes at bedtime.   06/15/2023 at pm   levETIRAcetam (KEPPRA) 500 MG tablet Take 500 mg by mouth 2 (two)  times daily.   06/15/2023 at pm   metoprolol tartrate (LOPRESSOR) 25 MG tablet Take 1 tablet (25 mg total) by mouth 2 (two) times daily.   06/15/2023 at pm   PARoxetine (PAXIL) 10 MG tablet Take 10 mg by mouth daily.   06/15/2023 at am   potassium chloride SA (K-DUR,KLOR-CON) 20 MEQ tablet Take 2 tablets (40 mEq total) by mouth daily.   06/15/2023 at am   simvastatin (ZOCOR) 20 MG tablet Take 20 mg by mouth at bedtime.   06/15/2023 at pm    Assessment: Pharmacy is consulted to start hepairn drip on 77 yo male diagnosed with atrial fibrillation. Pt has been changed to heparin drip. Last dose of apixaban at 0904 on 11/4.   Today, 07/08/23  aPTT Is 184 sec (supratherapeutic) and repeat level drawn is also high at 188  with heparin gtt @ 1200 units/hr Heparin level > 1.1, remains falsely elevated due to recent DOAC use CBC not yet resulted No complications of therapy noted  Goal of Therapy:  Heparin level  0.3-0.7 units/ml aPTT 66-102 sec  Monitor platelets by anticoagulation protocol: Yes   Plan:  Stop heparin for one hour and resume at decreased heparin rate of 1100 units/hr Obtain  aptt in 8 hours  Daily aPTT/HL  Will monitor using aPTT until HL and aPTT start to correlate Daily CBC  Monitor for signs and symptoms of bleeding    Adalberto Cole, PharmD, BCPS 07/08/2023 11:10 AM

## 2023-07-08 NOTE — Plan of Care (Signed)
No acute events this shift. The patient has rested comfortably throughout the shift. He has had no complaints of pain. He continues to have multiple very large and loose BMs. He has been turned frequently throughout the shift. He remains in Afib. VS otherwise stable. Fall precautions in place. Will continue to monitor.  Problem: Education: Goal: Ability to describe self-care measures that may prevent or decrease complications (Diabetes Survival Skills Education) will improve Outcome: Progressing Goal: Individualized Educational Video(s) Outcome: Progressing   Problem: Coping: Goal: Ability to adjust to condition or change in health will improve Outcome: Progressing   Problem: Fluid Volume: Goal: Ability to maintain a balanced intake and output will improve Outcome: Progressing   Problem: Health Behavior/Discharge Planning: Goal: Ability to identify and utilize available resources and services will improve Outcome: Progressing Goal: Ability to manage health-related needs will improve Outcome: Progressing   Problem: Metabolic: Goal: Ability to maintain appropriate glucose levels will improve Outcome: Progressing   Problem: Nutritional: Goal: Maintenance of adequate nutrition will improve Outcome: Progressing Goal: Progress toward achieving an optimal weight will improve Outcome: Progressing   Problem: Skin Integrity: Goal: Risk for impaired skin integrity will decrease Outcome: Progressing   Problem: Tissue Perfusion: Goal: Adequacy of tissue perfusion will improve Outcome: Progressing   Problem: Education: Goal: Knowledge of General Education information will improve Description: Including pain rating scale, medication(s)/side effects and non-pharmacologic comfort measures Outcome: Progressing   Problem: Health Behavior/Discharge Planning: Goal: Ability to manage health-related needs will improve Outcome: Progressing   Problem: Clinical Measurements: Goal: Ability to  maintain clinical measurements within normal limits will improve Outcome: Progressing Goal: Will remain free from infection Outcome: Progressing Goal: Diagnostic test results will improve Outcome: Progressing Goal: Respiratory complications will improve Outcome: Progressing Goal: Cardiovascular complication will be avoided Outcome: Progressing   Problem: Activity: Goal: Risk for activity intolerance will decrease Outcome: Progressing   Problem: Nutrition: Goal: Adequate nutrition will be maintained Outcome: Progressing   Problem: Coping: Goal: Level of anxiety will decrease Outcome: Progressing   Problem: Elimination: Goal: Will not experience complications related to bowel motility Outcome: Progressing Goal: Will not experience complications related to urinary retention Outcome: Progressing   Problem: Pain Managment: Goal: General experience of comfort will improve Outcome: Progressing   Problem: Safety: Goal: Ability to remain free from injury will improve Outcome: Progressing   Problem: Skin Integrity: Goal: Risk for impaired skin integrity will decrease Outcome: Progressing   Problem: Fluid Volume: Goal: Hemodynamic stability will improve Outcome: Progressing   Problem: Clinical Measurements: Goal: Diagnostic test results will improve Outcome: Progressing Goal: Signs and symptoms of infection will decrease Outcome: Progressing   Problem: Respiratory: Goal: Ability to maintain adequate ventilation will improve Outcome: Progressing   Problem: Education: Goal: Ability to describe self-care measures that may prevent or decrease complications (Diabetes Survival Skills Education) will improve Outcome: Progressing Goal: Individualized Educational Video(s) Outcome: Progressing   Problem: Cardiac: Goal: Ability to maintain an adequate cardiac output will improve Outcome: Progressing   Problem: Health Behavior/Discharge Planning: Goal: Ability to identify  and utilize available resources and services will improve Outcome: Progressing Goal: Ability to manage health-related needs will improve Outcome: Progressing   Problem: Fluid Volume: Goal: Ability to achieve a balanced intake and output will improve Outcome: Progressing   Problem: Metabolic: Goal: Ability to maintain appropriate glucose levels will improve Outcome: Progressing   Problem: Nutritional: Goal: Maintenance of adequate nutrition will improve Outcome: Progressing Goal: Maintenance of adequate weight for body size and type will improve Outcome: Progressing  Problem: Respiratory: Goal: Will regain and/or maintain adequate ventilation Outcome: Progressing   Problem: Urinary Elimination: Goal: Ability to achieve and maintain adequate renal perfusion and functioning will improve Outcome: Progressing   Problem: Education: Goal: Knowledge of disease or condition will improve Outcome: Progressing Goal: Knowledge of secondary prevention will improve (MUST DOCUMENT ALL) Outcome: Progressing Goal: Knowledge of patient specific risk factors will improve Loraine Leriche N/A or DELETE if not current risk factor) Outcome: Progressing   Problem: Ischemic Stroke/TIA Tissue Perfusion: Goal: Complications of ischemic stroke/TIA will be minimized Outcome: Progressing   Problem: Coping: Goal: Will verbalize positive feelings about self Outcome: Progressing Goal: Will identify appropriate support needs Outcome: Progressing   Problem: Health Behavior/Discharge Planning: Goal: Ability to manage health-related needs will improve Outcome: Progressing Goal: Goals will be collaboratively established with patient/family Outcome: Progressing   Problem: Self-Care: Goal: Ability to participate in self-care as condition permits will improve Outcome: Progressing Goal: Verbalization of feelings and concerns over difficulty with self-care will improve Outcome: Progressing Goal: Ability to  communicate needs accurately will improve Outcome: Progressing   Problem: Nutrition: Goal: Risk of aspiration will decrease Outcome: Progressing Goal: Dietary intake will improve Outcome: Progressing

## 2023-07-08 NOTE — Progress Notes (Signed)
Occupational Therapy Treatment Patient Details Name: Eric Zamora MRN: [id number] DOB: [birth year] Today's Date: 07/08/2023   History of present illness Pt is 77 yr old admitted with severe sepsis due to perirectal abscess.  Pt to OR on 10/18 for drainage of abscess.  Pt with increased lethargy on 10/24 , MRI Head performed and pt found to have acute infarct  right frontal operculum with moderate associated cytotoxic edema and petechial hemorrhage. Pt now found to have ileus 07/06/23.PMH: bipolar disorder, dementia, history of NSTEMI, hypertrophic cardiomyopathy   OT comments  The pt required increased assist for toileting management in bed, as well as to achieve sitting EOB. Once seated EOB, he required min-mod assist for sitting balance, given intermittent R sided and posterior leaning. He participated in B UE functional reaching tasks to the left, right, and front while seated EOB. He subsequently performed simple grooming/face washing seated EOB. He presented with good effort and participation. Able to answer yes/no to simple questions, as well as follow 1 step commands consistently. Pt assisted back to semi-fowler's at end of session. Continue OT plan of care. Patient will benefit from continued inpatient follow up therapy, <3 hours/day.       If plan is discharge home, recommend the following:  Direct supervision/assist for medications management;A lot of help with walking and/or transfers;A lot of help with bathing/dressing/bathroom   Equipment Recommendations  Other (comment) (defer to next level of care)    Recommendations for Other Services      Precautions / Restrictions Precautions Precautions: Fall Restrictions Weight Bearing Restrictions: No       Mobility Bed Mobility Overal bed mobility: Needs Assistance Bed Mobility: Supine to Sit, Sit to Supine, Rolling Rolling: Used rails, Max assist   Supine to sit: Mod assist, Used rails, HOB elevated Sit to supine: Mod  assist (required assist for BLE)   General bed mobility comments: Pt required cues for best transfer technique, to perform supine to sit and scooting to EOB, including reaching for bed rail, pulling on bed rail, trunk shifting, and advancing BLE           ADL either performed or assessed with clinical judgement   ADL Overall ADL's : Needs assistance/impaired Eating/Feeding: Bed level Eating/Feeding Details (indicate cue type and reason): Per pt's nurse tech, the pt has been able to grasp a cup and drink from it in bed after set-up assist was provided Grooming: Minimal assistance;Moderate assistance;Sitting Grooming Details (indicate cue type and reason): Pt required min to mod assist for sitting balance EOB, given intermittent R and posterior leaning. Once washclothe was placed in his R hand , he was able to perform face washing with SBA             Lower Body Dressing: Total assistance;Bed level Lower Body Dressing Details (indicate cue type and reason): for donning socks in bed     Toileting- Clothing Manipulation and Hygiene: Maximal assistance;Bed level Toileting - Clothing Manipulation Details (indicate cue type and reason): Pt noted to be soiled of loose bowel in bed, subsequently requiring increased assist for peri-hygiene, clothing management, and turning left and right in bed, in order to manage toileting needs                       Cognition Arousal: Alert Behavior During Therapy: [facility] for tasks assessed/performed Overall Cognitive Status: No family/caregiver present to determine baseline cognitive functioning        General Comments: Alert, able to answer  with yes/no responses to simple questions,follows 1 step commands with occasional repetition, cooperative, garbled speech                    Pertinent Vitals/ Pain       Pain Assessment Pain Assessment: No/denies pain         Frequency  Min 1X/week        Progress Toward Goals  OT  Goals(current goals can now be found in the care plan section)     Acute Rehab OT Goals Patient Stated Goal: he did not specifically state OT Goal Formulation: Patient unable to participate in goal setting Time For Goal Achievement: 07/10/23 Potential to Achieve Goals: Good  Plan         AM-PAC OT "6 Clicks" Daily Activity     Outcome Measure   Help from another person eating meals?: A Little Help from another person taking care of personal grooming?: A Lot Help from another person toileting, which includes using toliet, bedpan, or urinal?: A Lot Help from another person bathing (including washing, rinsing, drying)?: A Lot Help from another person to put on and taking off regular upper body clothing?: A Lot Help from another person to put on and taking off regular lower body clothing?: Total 6 Click Score: 12    End of Session Equipment Utilized During Treatment: Other (comment) (N/A)  OT Visit Diagnosis: Other abnormalities of gait and mobility (R26.89);Muscle weakness (generalized) (M62.81);Cognitive communication deficit (R41.841)   Activity Tolerance Patient tolerated treatment well   Patient Left in bed;with call bell/phone within reach;with bed alarm set   Nurse Communication Mobility status        Time: 1191-4782 OT Time Calculation (min): 25 min  Charges: OT General Charges $OT Visit: 1 Visit OT Treatments $Therapeutic Activity: 23-37 mins     Reuben Likes, OTR/L 07/08/2023, 4:44 PM

## 2023-07-08 NOTE — Progress Notes (Signed)
PROGRESS NOTE    Eric Zamora  ZHY:[id number] DOB: July 29, 1946 DOA: 06/15/2023 PCP: Uvaldo Bristle, PA-C    Chief Complaint  Patient presents with   Altered Mental Status    Brief Narrative:  Eric Zamora is a 77 y.o. 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 10/15 admitted to stepdown unit. 10/17: Overnight with fever, aspiration event, new AG acidosis, transferred back to SDU for BiPAP and insulin drip 10/18: taken to OR for drainage of abscess 10/23 cardiology was consulted for new onset A-fib with RVR. 10/24 nephrology was consulted for AKI and hyponatremia.  CT head was obtained for ongoing AMS and was found to have acute/subacute right MCA territory infarct.  Neurology was also consulted. 10/25.  Palliative care was consulted. 10/31.  Anticoagulation started again. 11/1.  Second dose of Bicillin given.  Underwent MBS. 11/2.  Marinol started for poor p.o. intake. 11/4.  Found to have ileus.  GI consulted.  Recommended DNR to legal guardian. 11/5.  Two-physician DNR form was signed and sent to guardian.  Will await decision.   Assessment & Plan:   Principal Problem:   Severe sepsis due to perirectal abscess(HCC) Active Problems:   Atrial fibrillation with rapid ventricular response (HCC)   Diabetic ketoacidosis without coma associated with type 2 diabetes mellitus (HCC)   Acute renal failure superimposed on stage 3b chronic kidney disease (HCC)   Dysphagia   CAD- Moderate LAD disease with moderate to severe ostial first diagonal disease by cath 07/2013 - not ammendable to PCI. 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 -Penrose drain placed and later removed.  No cultures were sent. -Patient noted to remain on IV vancomycin, Flagyl, Rocephin subsequently transitioned to cefepime and has been transition to Augmentin and doxycycline and recommending a total of 2 weeks of antibiotic treatment from initial I&D. -Antibiotic course completed. -Supportive care.  2.  Colonic ileus -Noted on x-ray 07/05/2023 and subsequently confirmed on CT scans. -Patient seen in consultation by GI who recommended conservative measures for now with repletion of electrolytes to keep potassium approximately 4, magnesium approximately 2. -Patient noted to have large watery bowel movement this morning per RN. -Patient currently on MiraLAX. -Continue Reglan 5 mg IV every 8 hours. -IV fluids. -Palliative care consulted and DNR recommended to legal guardian. -Frequent turns.  3.  New onset A-fib with RVR/HOCM -Patient during the hospitalization to have new onset A-fib with RVR with CHADS2Vasc score of 4. -Patient seen in consultation by cardiology was on metoprolol 100 mg 4 times daily subsequently changed to Toprol-XL. -Patient placed back on IV Lopressor due to colonic ileus. -Was on Eliquis for anticoagulation now on  IV heparin due to ileus. -Likely resume Eliquis once ileus has resolved.  4.  Acute metabolic encephalopathy/acute right MCA stroke/history of dementia -Patient noted to have a progressive worsening  mentation early on in the hospitalization. -CT head done 06/24/2023 with acute/subacute cortical and subcortical right MCA territory infarct. -MRI brain done consistent with acute infarct. -MRA brain done with no acute abnormalities. -Carotid Dopplers done with no significant ICA stenosis. -EEG done negative for seizures. -Repeat head CT done on 07/01/2023 per neurology recommendations showed no hemorrhagic conversion and as such neurology recommended resumption of anticoagulation with Eliquis. -Patient being followed by SLP was on dysphagia 1 diet however currently on clear liquids due to colonic ileus. -PT/OT following and recommending SNF placement.  5.  History of seizures -EEG done negative for seizures/epileptiform activity. -Depakote on hold due to hepatic function. -Was on Keppra which is currently on hold.  6.  Moderate MR -Monitor volume status.  7.  Dysphagia -Secondary to acute CVA. -Being followed by SLP, underwent MBS was on a dysphagia 1 diet and currently on clear liquids due to colonic ileus.   -Once ileus resolved will resume dysphagia 1 diet. -SLP following.  8.  Hypokalemia -Potassium at 2.7 this morning, magnesium at 2.4. -Continue D5W with KCl 40 mEq at 75 cc an hour. -KCl 10 mEq IV every hour x 6 rounds. -Potassium packet 40 mEq p.o. twice daily x 2 doses. -Repeat labs in the AM.  9.  Hypernatremia -Likely secondary to hypovolemic hypernatremia secondary to dehydration. -Improved with hydration and change of IV fluids to D5W. -Continue D5W for another 24 hours. -Repeat labs in the AM.  10.  Possible thrush/possible pill induced gastritis -Patient completed course of antibiotics. -Was on Diflucan through 07/08/2023. -Status post completion of nystatin swish and swallow.  11.  History of syphilis -RPR reactive for 2 years. -Dr. Allena Katz discussed with ID and patient currently being treated with Bicillin on a weekly basis.  With next dose 07/09/2023.  12.   AKI on CKD 3A -Baseline creatinine approximately 1.3 and noted to have worsening to 1.99. -Improved with hydration creatinine currently at 1.48. -Continue IV fluids.    13.  None anion gap metabolic acidosis -Likely secondary to CKD. -Patient with a worsening acidosis and as such we will place on a bicarb drip.  14.  CAD -Patient not on DAPT or antiplatelets secondary to being on Eliquis. -Outpatient follow-up with cardiology.  15.  Bipolar disorder -Not on any medications prior to admission. -Stable.  16.  Poor oral intake -Likely multifactorial secondary to acute CVA and colonic ileus. -Was initially placed on Marinol which is currently on hold.  17.  Goals of care conversation -Dr. Allena Katz discussed with legal guardian on the phone. -Patient noted with poor prognosis due to large right MCA infarct with deficits and dysphagia with dysarthria. -Patient noted with poor oral intake and now with colonic ileus currently under conservative treatment. -It is felt with acute CVA patient likely will not return to his prior status prior to admission. -It is felt that the patient has a cardiac arrest prognosis remains very poor to return to his prior status prior to arrest. -Patient with poor quality of life and Dr. Allena Katz recommended legal guardian to consider DNR for patient. -Palliative care following.     DVT prophylaxis: Heparin Code Status: Full Family Communication: Updated patient.  No family at bedside. Disposition: SNF once ileus has resolved and clinically improved.  Status is: Inpatient Remains inpatient appropriate because: Severity  of illness   Consultants:  General surgery: Dr. Derrell Lolling 06/18/2023 Gastroenterology: Dr. Ewing Schlein 07/05/2023 Cardiology: Dr. Royann Shivers 06/23/2023 Nephrology: Dr. Ronalee Belts 06/24/2023 Neurology: Dr.Bhagat 06/24/2023 Palliative care: Dr. Patterson Hammersmith 06/25/2023  Procedures:  CT abdomen and pelvis 07/05/2023 CT head 06/15/2023, 06/24/2023,  07/01/2023 Chest x-ray 06/15/2023, 06/17/2023 Modified barium swallow 06/19/2023 Abdominal films 07/05/2023, 07/06/2023 MRI brain 06/25/2023 MRA head 06/25/2023 Right upper quadrant ultrasound 06/22/2023 2D echo 06/16/2023 Carotid Dopplers 06/25/2023 CT chest abdomen and pelvis 06/17/2023 EEG 06/25/2023 Irrigation and debridement perirectal abscess per general surgery: Dr. Derrell Lolling 06/18/2023 PICC line placement 07/08/2023  Antimicrobials:  Anti-infectives (From admission, onward)    Start     Dose/Rate Route Frequency Ordered Stop   07/05/23 1600  fluconazole (DIFLUCAN) IVPB 200 mg        200 mg 100 mL/hr over 60 Minutes Intravenous Every 24 hours 07/05/23 1320 07/09/23 1559   07/02/23 1000  fluconazole (DIFLUCAN) tablet 100 mg  Status:  Discontinued        100 mg Oral Daily 07/02/23 0802 07/05/23 1320   07/02/23 0800  penicillin g benzathine (BICILLIN LA) 1200000 UNIT/2ML injection 2.4 Million Units        2.4 Million Units Intramuscular Weekly 07/01/23 1209 07/16/23 0759   06/26/23 1500  penicillin g benzathine (BICILLIN LA) 1200000 UNIT/2ML injection 2.4 Million Units        2.4 Million Units Intramuscular  Once 06/26/23 1351 06/28/23 0018   06/24/23 2200  amoxicillin-clavulanate (AUGMENTIN) 500-125 MG per tablet 1 tablet  Status:  Discontinued        1 tablet Oral Every 12 hours 06/24/23 1450 07/03/23 1202   06/24/23 2200  amoxicillin-clavulanate (AUGMENTIN) 500-125 MG per tablet 1 tablet  Status:  Discontinued        1 tablet Oral 2 times daily 06/24/23 1451 06/24/23 1451   06/21/23 1215  doxycycline (VIBRA-TABS) tablet 100 mg  Status:  Discontinued        100 mg Oral Every 12 hours 06/21/23 1128 07/03/23 1202   06/21/23 1215  amoxicillin-clavulanate (AUGMENTIN) 875-125 MG per tablet 1 tablet  Status:  Discontinued        1 tablet Oral Every 12 hours 06/21/23 1128 06/24/23 1450   06/20/23 1200  vancomycin (VANCOREADY) IVPB 1500 mg/300 mL  Status:  Discontinued        1,500  mg 150 mL/hr over 120 Minutes Intravenous Every 36 hours 06/20/23 0719 06/21/23 1128   06/19/23 0000  vancomycin (VANCOREADY) IVPB 1250 mg/250 mL  Status:  Discontinued        1,250 mg 166.7 mL/hr over 90 Minutes Intravenous Every 36 hours 06/18/23 0939 06/20/23 0719   06/18/23 1800  ceFEPIme (MAXIPIME) 2 g in sodium chloride 0.9 % 100 mL IVPB  Status:  Discontinued        2 g 200 mL/hr over 30 Minutes Intravenous Every 12 hours 06/18/23 0939 06/21/23 1128   06/18/23 0600  ceFEPIme (MAXIPIME) 2 g in sodium chloride 0.9 % 100 mL IVPB  Status:  Discontinued        2 g 200 mL/hr over 30 Minutes Intravenous Every 24 hours 06/17/23 0920 06/18/23 0939   06/17/23 1400  vancomycin (VANCOCIN) IVPB 1000 mg/200 mL premix  Status:  Discontinued        1,000 mg 200 mL/hr over 60 Minutes Intravenous Every 36 hours 06/17/23 0920 06/18/23 0939   06/16/23 1800  vancomycin (VANCOREADY) IVPB 750 mg/150 mL  Status:  Discontinued        750 mg 150  mL/hr over 60 Minutes Intravenous Every 24 hours 06/15/23 1639 06/17/23 0920   06/16/23 1100  cefTRIAXone (ROCEPHIN) 2 g in sodium chloride 0.9 % 100 mL IVPB  Status:  Discontinued        2 g 200 mL/hr over 30 Minutes Intravenous Every 24 hours 06/15/23 1453 06/15/23 1455   06/15/23 1800  ceFEPIme (MAXIPIME) 2 g in sodium chloride 0.9 % 100 mL IVPB  Status:  Discontinued        2 g 200 mL/hr over 30 Minutes Intravenous Every 12 hours 06/15/23 1558 06/17/23 0920   06/15/23 1700  metroNIDAZOLE (FLAGYL) IVPB 500 mg  Status:  Discontinued        500 mg 100 mL/hr over 60 Minutes Intravenous Every 12 hours 06/15/23 1455 06/21/23 1128   06/15/23 1630  vancomycin (VANCOREADY) IVPB 1500 mg/300 mL        1,500 mg 150 mL/hr over 120 Minutes Intravenous  Once 06/15/23 1558 06/15/23 2058   06/15/23 1045  cefTRIAXone (ROCEPHIN) 2 g in sodium chloride 0.9 % 100 mL IVPB        2 g 200 mL/hr over 30 Minutes Intravenous  Once 06/15/23 1041 06/15/23 1155          Subjective: Patient laying in bed.  Denies any chest pain or shortness of breath.  Denies any significant abdominal pain.  Passing flatus.  No bowel movement per patient however per RN patient with large bowel movement earlier this morning.  Objective: Vitals:   07/07/23 0707 07/07/23 1230 07/07/23 1934 07/08/23 0523  BP:  135/88 126/73 103/76  Pulse:  77 78 91  Resp:  17 18 19   Temp:  98.4 F (36.9 C) 98.7 F (37.1 C) 97.8 F (36.6 C)  TempSrc:  Oral Oral Oral  SpO2:  100% 100% 100%  Weight: 80.1 kg   81.8 kg  Height:        Intake/Output Summary (Last 24 hours) at 07/08/2023 1149 Last data filed at 07/08/2023 0100 Gross per 24 hour  Intake 2280.2 ml  Output 600 ml  Net 1680.2 ml   Filed Weights   07/06/23 0500 07/07/23 0707 07/08/23 0523  Weight: 76.4 kg 80.1 kg 81.8 kg    Examination:  General exam: NAD. Respiratory system: CTAB anterior lung fields.  No wheezes, no crackles, no rhonchi.  Normal respiratory effort.  Fair air movement.   Cardiovascular system: Regular rate rhythm no murmurs rubs or gallops.  No JVD.  No lower extremity edema.  Gastrointestinal system: Abdomen is obese, distended, positive bowel sounds.  Nontender to palpation.  No rebound.  No guarding.  Central nervous system: Alert and oriented.  Dysarthric speech.  Some left facial weakness.  4/5 bilateral upper extremity strength.  4/5 bilateral lower extremity strength.   Extremities: Symmetric 5 x 5 power. Skin: No rashes, lesions or ulcers Psychiatry: Judgement and insight appear fair. Mood & affect appropriate.     Data Reviewed: I have personally reviewed following labs and imaging studies  CBC: Recent Labs  Lab 07/02/23 0539 07/03/23 0615 07/05/23 0503 07/06/23 0518 07/07/23 0700 07/08/23 0548  WBC 7.9 7.8 8.7 7.5 7.6 7.3  NEUTROABS 5.1 5.3  --   --   --   --   HGB 12.3* 11.9* 13.4 13.5 12.7* 11.6*  HCT 38.5* 37.5* 42.7 42.8 41.4 37.3*  MCV 91.4 90.1 92.2 91.6 94.5 92.8  PLT  184 201 282 267 277 231    Basic Metabolic Panel: Recent Labs  Lab 07/04/23 0808  07/04/23 1644 07/05/23 0503 07/05/23 1504 07/06/23 0518 07/06/23 1452 07/06/23 2240 07/07/23 0523 07/07/23 0700 07/08/23 0548  NA 144   < > 145   < > 149* 148* 151*  --  150* 143  K 2.7*   < > 2.9*   < > 2.6* 4.0 2.8*  --  2.4* 2.7*  CL 114*   < > 116*   < > 119* 124* 128*  --  127* 119*  CO2 19*   < > 18*   < > 17* 16* 15*  --  15* 14*  GLUCOSE 158*   < > 203*   < > 124* 131* 130*  --  109* 121*  BUN 27*   < > 30*   < > 32* 31* 27*  --  25* 17  CREATININE 1.27*   < > 1.35*   < > 1.62* 1.57* 1.45*  --  1.51* 1.48*  CALCIUM 8.8*   < > 9.0   < > 8.9 8.2* 7.9*  --  7.9* 7.6*  MG 2.5*  --  2.6*  --  2.7*  --   --  2.3  --  2.4   < > = values in this interval not displayed.    GFR: Estimated Creatinine Clearance: 40.5 mL/min (A) (by C-G formula based on SCr of 1.48 mg/dL (H)).  Liver Function Tests: Recent Labs  Lab 07/02/23 0539 07/03/23 0615 07/08/23 0548  AST 33 30 25  ALT 28 24 24   ALKPHOS 31* 31* 35*  BILITOT 1.0 0.7 0.6  PROT 6.9 6.8 6.3*  ALBUMIN 2.6* 2.6* 2.5*    CBG: Recent Labs  Lab 07/07/23 2000 07/08/23 0002 07/08/23 0402 07/08/23 0743 07/08/23 1120  GLUCAP 142* 105* 99 121* 134*     No results found for this or any previous visit (from the past 240 hour(s)).       Radiology Studies: [place] EKG SITE RITE  Result Date: 07/07/2023 If Site Rite image not attached, placement could not be confirmed due to current cardiac rhythm.       Scheduled Meds:  Chlorhexidine Gluconate Cloth  6 each Topical Daily   insulin aspart  0-15 Units Subcutaneous Q4H   insulin glargine-yfgn  8 Units Subcutaneous QHS   magic mouthwash  10 mL Oral QID   metoCLOPramide (REGLAN) injection  5 mg Intravenous Q8H   metoprolol tartrate  5 mg Intravenous Q6H   mouth rinse  15 mL Mouth Rinse 4 times per day   pantoprazole (PROTONIX) IV  40 mg Intravenous Q24H   penicillin g benzathine  (BICILLIN-LA) IM  2.4 Million Units Intramuscular Weekly   polyethylene glycol  17 g Oral BID   potassium chloride  40 mEq Oral BID   sodium chloride flush  3 mL Intravenous Q12H   Continuous Infusions:  dextrose 5 % 1,000 mL with potassium chloride 40 mEq infusion 75 mL/hr at 07/08/23 0840   fluconazole (DIFLUCAN) IV 200 mg (07/07/23 1656)   heparin     sodium bicarbonate 150 mEq in dextrose 5 % 1,150 mL infusion 125 mL/hr at 07/08/23 1116     LOS: 23 days    Time spent: 40 minutes    Ramiro Harvest, MD [facility]   To contact the attending provider between 7A-7P or the covering provider during after hours 7P-7A, please log into the web site [email or web address] and access using universal [place] password for that web site. If you do not have the password, please call the hospital operator.  07/08/2023, 11:49 AM

## 2023-07-08 NOTE — Progress Notes (Signed)
   07/08/23 0646  Provider Notification  Provider Name/Title A.Virgel Manifold, NP  Date Provider Notified 07/08/23  Time Provider Notified [phone]  Method of Notification Page  Notification Reason Critical Result  Test performed and critical result Potassium 2.7  Date Critical Result Received 07/08/23  Time Critical Result Received 0644  Provider response See new orders  Date of Provider Response 07/08/23  Time of Provider Response [phone]

## 2023-07-08 NOTE — Progress Notes (Signed)
Peripherally Inserted Central Catheter Placement  The IV Nurse has discussed with the patient and/or persons authorized to consent for the patient, the purpose of this procedure and the potential benefits and risks involved with this procedure.  The benefits include less needle sticks, lab draws from the catheter, and the patient may be discharged home with the catheter. Risks include, but not limited to, infection, bleeding, blood clot (thrombus formation), and puncture of an artery; nerve damage and irregular heartbeat and possibility to perform a PICC exchange if needed/ordered by physician.  Alternatives to this procedure were also discussed.  Bard Power PICC patient education guide, fact sheet on infection prevention and patient information card has been provided to patient /or left at bedside.    PICC Placement Documentation  PICC Double Lumen 07/08/23 Right Brachial 39 cm 0 cm (Active)  Indication for Insertion or Continuance of Line Limited venous access - need for IV therapy >5 days (PICC only) 07/08/23 0905  Exposed Catheter (cm) 0 cm 07/08/23 0905  Site Assessment Clean, Dry, Intact 07/08/23 0905  Lumen #1 Status Flushed;Saline locked;Blood return noted 07/08/23 0905  Lumen #2 Status Flushed;Saline locked;Blood return noted 07/08/23 0905  Dressing Type Transparent;Securing device 07/08/23 0905  Dressing Status Antimicrobial disc in place;Clean, Dry, Intact 07/08/23 0905  Line Care Connections checked and tightened 07/08/23 0905  Line Adjustment (NICU/IV Team Only) No 07/08/23 0905  Dressing Intervention New dressing;Adhesive placed at insertion site (IV team only) 07/08/23 0905  Dressing Change Due 07/15/23 07/08/23 0905   Telephone consent by legal guardian Armen Pickup [place] 07/08/2023, 9:20 AM

## 2023-07-09 ENCOUNTER — Inpatient Hospital Stay (HOSPITAL_COMMUNITY): Payer: Medicare (Managed Care)

## 2023-07-09 DIAGNOSIS — K567 Ileus, unspecified: Secondary | ICD-10-CM

## 2023-07-09 DIAGNOSIS — I639 Cerebral infarction, unspecified: Secondary | ICD-10-CM

## 2023-07-09 DIAGNOSIS — R4182 Altered mental status, unspecified: Secondary | ICD-10-CM | POA: Diagnosis not present

## 2023-07-09 DIAGNOSIS — I4891 Unspecified atrial fibrillation: Secondary | ICD-10-CM | POA: Diagnosis not present

## 2023-07-09 DIAGNOSIS — A419 Sepsis, unspecified organism: Secondary | ICD-10-CM | POA: Diagnosis not present

## 2023-07-09 DIAGNOSIS — R1310 Dysphagia, unspecified: Secondary | ICD-10-CM | POA: Diagnosis not present

## 2023-07-09 LAB — CBC
HCT: 31 % — ABNORMAL LOW (ref 39.0–52.0)
Hemoglobin: 10.2 g/dL — ABNORMAL LOW (ref 13.0–17.0)
MCH: 29.6 pg (ref 26.0–34.0)
MCHC: 32.9 g/dL (ref 30.0–36.0)
MCV: 89.9 fL (ref 80.0–100.0)
Platelets: 202 10*3/uL (ref 150–400)
RBC: 3.45 MIL/uL — ABNORMAL LOW (ref 4.22–5.81)
RDW: 15.3 % (ref 11.5–15.5)
WBC: 7.1 10*3/uL (ref 4.0–10.5)
nRBC: 0 % (ref 0.0–0.2)

## 2023-07-09 LAB — BASIC METABOLIC PANEL
Anion gap: 6 (ref 5–15)
Anion gap: 8 (ref 5–15)
BUN: 11 mg/dL (ref 8–23)
BUN: 7 mg/dL — ABNORMAL LOW (ref 8–23)
CO2: 20 mmol/L — ABNORMAL LOW (ref 22–32)
CO2: 22 mmol/L (ref 22–32)
Calcium: 6.8 mg/dL — ABNORMAL LOW (ref 8.9–10.3)
Calcium: 7.2 mg/dL — ABNORMAL LOW (ref 8.9–10.3)
Chloride: 112 mmol/L — ABNORMAL HIGH (ref 98–111)
Chloride: 106 mmol/L (ref 98–111)
Creatinine, Ser: 1.24 mg/dL (ref 0.61–1.24)
Creatinine, Ser: 1.23 mg/dL (ref 0.61–1.24)
GFR, Estimated: 60 mL/min — ABNORMAL LOW (ref >60)
GFR, Estimated: >60 mL/min (ref >60)
Glucose, Bld: 123 mg/dL — ABNORMAL HIGH (ref 70–99)
Glucose, Bld: 179 mg/dL — ABNORMAL HIGH (ref 70–99)
Potassium: 2.3 mmol/L — CL (ref 3.5–5.1)
Potassium: 2.6 mmol/L — CL (ref 3.5–5.1)
Sodium: 138 mmol/L (ref 135–145)
Sodium: 136 mmol/L (ref 135–145)

## 2023-07-09 LAB — GLUCOSE, CAPILLARY
Glucose-Capillary: 108 mg/dL — ABNORMAL HIGH (ref 70–99)
Glucose-Capillary: 109 mg/dL — ABNORMAL HIGH (ref 70–99)
Glucose-Capillary: 110 mg/dL — ABNORMAL HIGH (ref 70–99)
Glucose-Capillary: 131 mg/dL — ABNORMAL HIGH (ref 70–99)
Glucose-Capillary: 135 mg/dL — ABNORMAL HIGH (ref 70–99)
Glucose-Capillary: 149 mg/dL — ABNORMAL HIGH (ref 70–99)

## 2023-07-09 LAB — APTT
aPTT: 141 s — ABNORMAL HIGH (ref 24–36)
aPTT: 78 s — ABNORMAL HIGH (ref 24–36)

## 2023-07-09 LAB — HEPARIN LEVEL (UNFRACTIONATED)
Heparin Unfractionated: 0.47 [IU]/mL (ref 0.30–0.70)
Heparin Unfractionated: 0.4 [IU]/mL (ref 0.30–0.70)

## 2023-07-09 LAB — MAGNESIUM: Magnesium: 1.9 mg/dL (ref 1.7–2.4)

## 2023-07-09 MED ORDER — POTASSIUM CHLORIDE CRYS ER 20 MEQ PO TBCR
40.0000 meq | EXTENDED_RELEASE_TABLET | ORAL | Status: AC
  Administered 2023-07-09 (×2): 40 meq via ORAL
  Filled 2023-07-09 (×2): qty 2

## 2023-07-09 MED ORDER — POTASSIUM CHLORIDE 10 MEQ/100ML IV SOLN
10.0000 meq | INTRAVENOUS | Status: AC
  Administered 2023-07-09 (×6): 10 meq via INTRAVENOUS
  Filled 2023-07-09 (×6): qty 100

## 2023-07-09 MED ORDER — POTASSIUM CHLORIDE 10 MEQ/100ML IV SOLN
10.0000 meq | INTRAVENOUS | Status: AC
  Administered 2023-07-09 – 2023-07-10 (×4): 10 meq via INTRAVENOUS
  Filled 2023-07-09 (×4): qty 100

## 2023-07-09 MED ORDER — POTASSIUM CHLORIDE 20 MEQ PO PACK
60.0000 meq | PACK | Freq: Once | ORAL | Status: AC
  Administered 2023-07-09: 60 meq via ORAL
  Filled 2023-07-09: qty 3

## 2023-07-09 MED ORDER — SIMETHICONE 40 MG/0.6ML PO SUSP
40.0000 mg | Freq: Four times a day (QID) | ORAL | Status: DC
  Administered 2023-07-09 – 2023-08-02 (×91): 40 mg via ORAL
  Filled 2023-07-09 (×98): qty 0.6

## 2023-07-09 MED ORDER — MAGNESIUM SULFATE IN D5W 1-5 GM/100ML-% IV SOLN
1.0000 g | Freq: Once | INTRAVENOUS | Status: AC
  Administered 2023-07-09: 1 g via INTRAVENOUS
  Filled 2023-07-09: qty 100

## 2023-07-09 MED ORDER — POTASSIUM CHLORIDE CRYS ER 20 MEQ PO TBCR
30.0000 meq | EXTENDED_RELEASE_TABLET | Freq: Four times a day (QID) | ORAL | Status: AC
  Administered 2023-07-09 – 2023-07-10 (×2): 30 meq via ORAL
  Filled 2023-07-09 (×2): qty 1

## 2023-07-09 MED ORDER — CALCIUM GLUCONATE-NACL 2-0.675 GM/100ML-% IV SOLN
2.0000 g | Freq: Once | INTRAVENOUS | Status: AC
  Administered 2023-07-09: 2000 mg via INTRAVENOUS
  Filled 2023-07-09: qty 100

## 2023-07-09 MED ORDER — HEPARIN (PORCINE) 25000 UT/250ML-% IV SOLN
900.0000 [IU]/h | INTRAVENOUS | Status: AC
Start: 2023-07-09 — End: 2023-07-13
  Administered 2023-07-09 – 2023-07-10 (×2): 900 [IU]/h via INTRAVENOUS
  Administered 2023-07-11 – 2023-07-12 (×2): 1050 [IU]/h via INTRAVENOUS
  Filled 2023-07-09 (×4): qty 250

## 2023-07-09 MED ORDER — SODIUM CHLORIDE 0.45 % IV SOLN
INTRAVENOUS | Status: DC
  Filled 2023-07-09 (×3): qty 1000

## 2023-07-09 NOTE — Progress Notes (Addendum)
Date and time results received: 07/09/23 0434   Test: Potassium Critical Value: 2.3  Name of Provider Notified: Anthoney Harada, NPP  Orders Received and started Potassium replacement.   Received from Lab [facility])

## 2023-07-09 NOTE — Progress Notes (Signed)
  Daily Progress Note   Patient Name: Eric Zamora       Date: 07/09/2023 DOB: [birth year]  Age: 77 y.o. MRN#: [id number] Attending Physician: Eric Bong, MD Primary Care Physician: Eric Bristle, PA-C Admit Date: 06/15/2023 Length of Stay: 24 days  Reason for Consultation/Follow-up: Establishing goals of care  Subjective:   CC: Patient denies any concerns today. Following up regarding complex medical decision making.   Subjective:  Reviewed EMR prior to presenting to bedside.  With regards to patient's ileus, patient now having multiple loose bowel movements. Confirmed with TOC and hospitalist that two-physician DNR/DNI was signed and active.  Have appropriately adjusted CODE STATUS accordingly.  Presented to bedside to see patient.  Patient laying comfortably in bed.  RN present at bedside.  Patient denies any concerns at that time.  RN noted that patient continues to have multiple loose bowel movements and is now having rectal tube placed. Noted palliative medicine team and continue following with patient's medical journey.  Objective:   Vital Signs:  BP 99/66 (BP Location: Left Arm)   Pulse 84   Temp 97.9 F (36.6 C) (Oral)   Resp 17   Ht 5\' 5"  (1.651 m)   Wt 79 kg   SpO2 99%   BMI 28.98 kg/m   Physical Exam: General: NAD, laying in bed, awake moist mucous membranes Cardiovascular: RRR Respiratory: no increased work of breathing noted, not in respiratory distress Neuro: awake, pleasantly confused at times  Imaging: I personally reviewed recent imaging.   Assessment & Plan:   Assessment: Patient is a 77 year old male with a past medical history of bipolar disorder, dementia, CKD, CAD, HFpEF, hypertension, history of DVT, and long-term care resident at SNF who was admitted on 06/15/2023 for management of fever, tachypnea, and found to have a perirectal abscess. During prolonged hospitalization patient has received medical management for perirectal  abscess, right MCA,  AKI on CKD, new onset Afib with RVR, and ileus. Palliative medicine team consulted to assist with complex medical decision making.   Recommendations/Plan: # Complex medical decision making/goals of care:  - Patient unable to participate in complex medical decision making due to medical status. Patient has legal guardian in place.    Code Status: Limited: Do not attempt resuscitation (DNR) -DNR-LIMITED -Do Not Intubate/DNI    -Completed two physician DNR/DNI form with hospitalist on 11/5.   # Symptom management:  -As per hospitalist   # Psychosocial Support:  -Legal GuardianDaune Zamora  # Discharge Planning: To Be Determined  Discussed with: TOC, RN, hospitalist  Thank you for allowing the palliative care team to participate in the care [facility].  Eric Morin, DO Palliative Care Provider PMT # [phone]  If patient remains symptomatic despite maximum doses, please call PMT at [phone] between 0700 and 1900. Outside of these hours, please call attending, as PMT does not have night coverage.  *Please note that this is a verbal dictation therefore any spelling or grammatical errors are due to the "[employer]" system interpretation.

## 2023-07-09 NOTE — Progress Notes (Deleted)
Cardiology Clinic Note   Patient Name: Eric Zamora Date of Encounter: 07/09/2023  Primary Care Provider:  Uvaldo Bristle, PA-C Primary Cardiologist:  None  Patient Profile    Eric Zamora 77 year old male presents to the clinic today for follow-up evaluation of his atrial fibrillation.  Past Medical History    Past Medical History:  Diagnosis Date   Alzheimer disease (HCC)    Bipolar affective disorder (HCC)    Chronic renal insufficiency    Dementia (HCC)    Dyslipidemia    HTN (hypertension)    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   gradient on TEE   Syncopal episodes    Past Surgical History:  Procedure Laterality Date   INCISION AND DRAINAGE PERIRECTAL ABSCESS N/A 06/18/2023   Procedure: IRRIGATION AND DEBRIDEMENT PERIRECTAL ABSCESS;  Surgeon: Axel Filler, MD;  Location: [facility];  Service: General;  Laterality: N/A;   LEFT HEART CATHETERIZATION WITH CORONARY ANGIOGRAM N/A 07/06/2013   Procedure: LEFT HEART CATHETERIZATION WITH CORONARY ANGIOGRAM;  Surgeon: Marykay Lex, MD;  Location: [facility] CATH LAB;  Service: Cardiovascular;  Laterality: N/A;    Allergies  No Known Allergies  History of Present Illness    Eric Zamora has a PMH of severe asymmetrical hypertrophy, hypertrophic cardiomyopathy, moderate mitral valve regurgitation, coronary artery disease, HLD, CKD, dementia, sepsis due to perirectal abscess, and hypernatremia.  His PMH also includes new onset atrial fibrillation.  He was admitted to the hospital on 06/15/2023.  He was diagnosed with perirectal abscess and noted to be in new onset atrial fibrillation with RVR.  His echocardiogram showed severe asymmetric hypertrophy.  His heart rate was noted to be in the 90-100 range on metoprolol 100 mg twice daily.  He was started on apixaban.  It was felt that cardioversion and 3 weeks would be necessary if he did not convert to sinus rhythm on his own.  His TSH was normal.  It  was also felt that due to his cognitive deficits that rate control may be preferred.  His statin was held due to transaminitis.  Aspirin was held due to being on apixaban.  His clopidogrel was discontinued.  He presents to the clinic today for follow-up evaluation and states***.  *** denies chest pain, shortness of breath, lower extremity edema, fatigue, palpitations, melena, hematuria, hemoptysis, diaphoresis, weakness, presyncope, syncope, orthopnea, and PND.  New onset atrial fibrillation-EKG today shows***.  Reports compliance with apixaban.  Denies bleeding issues. Continue metoprolol, apixaban Avoid triggers caffeine, chocolate, EtOH, dehydration etc.  Hypertrophic cardiomyopathy-has been present since 2010.   Continue blood pressure control, beta-blocker therapy Maintain physical activity Heart healthy low-sodium diet  Coronary artery disease-denies chest pain.  Known left anterior descending disease with moderate-severe first diagonal disease.  No aspirin due to apixaban.  His clopidogrel was discontinued.  Statin therapy was also held due to transaminitis High-fiber diet Plan for repeat LFTs in 1-2 months  Sepsis-underwent I&D of perirectal abscess. Following with PCP  Dementia-pleasant mood today.  Presents with***. Follows with PCP  Disposition: Follow-up with Dr. Royann Shivers or me in***.   Home Medications    Prior to Admission medications   Medication Sig Start Date End Date Taking? Authorizing Provider  clopidogrel (PLAVIX) 75 MG tablet Take 75 mg by mouth daily. 05/01/21   [provider]  ergocalciferol (VITAMIN D2) 1.25 MG (50000 UT) capsule Take 50,000 Units by mouth every 30 (thirty) days. On the 7 th of every month.    [provider]  furosemide (LASIX) 40 MG tablet Take 1 tablet (40 mg total) by mouth daily. 07/10/13   Rai, Ripudeep K, MD  latanoprost (XALATAN) 0.005 % ophthalmic solution Place 1 drop into both eyes at bedtime. 06/03/23   [provider]  levETIRAcetam (KEPPRA) 500 MG tablet Take 500 mg by mouth 2 (two) times daily. 05/01/21   [provider]  metoprolol tartrate (LOPRESSOR) 25 MG tablet Take 1 tablet (25 mg total) by mouth 2 (two) times daily. 07/10/13   Rai, Ripudeep K, MD  PARoxetine (PAXIL) 10 MG tablet Take 10 mg by mouth daily. 05/01/21   [provider]  potassium chloride SA (K-DUR,KLOR-CON) 20 MEQ tablet Take 2 tablets (40 mEq total) by mouth daily. 07/10/13   Rai, Delene Ruffini, MD  simvastatin (ZOCOR) 20 MG tablet Take 20 mg by mouth at bedtime.    [provider]    Family History    No family history on file. He indicated that his mother is deceased. He indicated that his father is deceased.  Social History    Social History   Socioeconomic History   Marital status: Divorced    Spouse name: Not on file   Number of children: 5   Years of education: some college   Highest education level: Not on file  Occupational History   Occupation: Retired  Tobacco Use   Smoking status: Never   Smokeless tobacco: Never  Vaping Use   Vaping status: Never Used  Substance and Sexual Activity   Alcohol use: No   Drug use: Not Currently   Sexual activity: Not on file  Other Topics Concern   Not on file  Social History Narrative   Lives at [place].   Right-handed.   No daily caffeine use.       Social Determinants of Health   Financial Resource Strain: Not on file  Food Insecurity: Patient Unable To Answer (06/15/2023)   Hunger Vital Sign    Worried About Running Out of Food in the Last Year: Patient unable to answer    Ran Out of Food in the Last Year: Patient unable to answer  Transportation Needs: Patient Unable To Answer (06/15/2023)   PRAPARE - Transportation    Lack of Transportation (Medical): Patient unable to answer    Lack of Transportation (Non-Medical): Patient unable to answer  Physical Activity: Not on file  Stress: Not on file  Social  Connections: Not on file  Intimate Partner Violence: Patient Unable To Answer (06/15/2023)   Humiliation, Afraid, Rape, and Kick questionnaire    Fear of Current or Ex-Partner: Patient unable to answer    Emotionally Abused: Patient unable to answer    Physically Abused: Patient unable to answer    Sexually Abused: Patient unable to answer     Review of Systems    General:  No chills, fever, night sweats or weight changes.  Cardiovascular:  No chest pain, dyspnea on exertion, edema, orthopnea, palpitations, paroxysmal nocturnal dyspnea. Dermatological: No rash, lesions/masses Respiratory: No cough, dyspnea Urologic: No hematuria, dysuria Abdominal:   No nausea, vomiting, diarrhea, bright red blood per rectum, melena, or hematemesis Neurologic:  No visual changes, wkns, changes in mental status. All other systems reviewed and are otherwise negative except as noted above.  Physical Exam    VS:  There were no vitals taken for this visit. , BMI There is no height or weight on file to calculate BMI. GEN: Well nourished, well developed, in no acute distress. HEENT: normal.  Neck: Supple, no JVD, carotid bruits, or masses. Cardiac: RRR, no murmurs, rubs, or gallops. No clubbing, cyanosis, edema.  Radials/DP/PT 2+ and equal bilaterally.  Respiratory:  Respirations regular and unlabored, clear to auscultation bilaterally. GI: Soft, nontender, nondistended, BS + x 4. MS: no deformity or atrophy. Skin: warm and dry, no rash. Neuro:  Strength and sensation are intact. Psych: Normal affect.  Accessory Clinical Findings    Recent Labs: 06/15/2023: B Natriuretic Peptide 1,193.2; TSH 1.976 07/08/2023: ALT 24 07/09/2023: BUN 11; Creatinine, Ser 1.24; Hemoglobin 10.2; Magnesium 1.9; Platelets 202; Potassium 2.3; Sodium 138   Recent Lipid Panel    Component Value Date/Time   CHOL 125 06/25/2023 0559   TRIG 153 (H) 06/25/2023 0559   HDL 24 (L) 06/25/2023 0559   CHOLHDL 5.2 06/25/2023 0559    VLDL 31 06/25/2023 0559   LDLCALC 70 06/25/2023 0559         ECG personally reviewed by me today- ***     Echocardiogram 06/16/2023  IMPRESSIONS     1. Severe asymmetric hypertrophy of the basal septum up to 16 mm. There  is mitral valve SAM with LVOT obstruction up to 41 mmHG. There is  moderate, centrally/posterior directed mitral valve regurgitation.  Findings are concerning for hypertrophic  cardiomyopathy. Left ventricular ejection fraction, by estimation, is 60  to 65%. The left ventricle has normal function. Left ventricular  endocardial border not optimally defined to evaluate regional wall motion.  There is severe asymmetric left  ventricular hypertrophy of the basal-septal segment. Left ventricular  diastolic function could not be evaluated.   2. Right ventricular systolic function is normal. The right ventricular  size is normal. There is normal pulmonary artery systolic pressure. The  estimated right ventricular systolic pressure is 23.1 mmHg.   3. Left atrial size was mildly dilated.   4. The mitral valve is grossly normal. Moderate mitral valve  regurgitation. No evidence of mitral stenosis.   5. The aortic valve is tricuspid. There is mild calcification of the  aortic valve. Aortic valve regurgitation is not visualized. Aortic valve  sclerosis is present, with no evidence of aortic valve stenosis.   6. The inferior vena cava is normal in size with greater than 50%  respiratory variability, suggesting right atrial pressure of 3 mmHg.   FINDINGS   Left Ventricle: Severe asymmetric hypertrophy of the basal septum up to  16 mm. There is mitral valve SAM with LVOT obstruction up to 41 mmHG.  There is moderate, centrally/posterior directed mitral valve  regurgitation. Findings are concerning for  hypertrophic cardiomyopathy. Left ventricular ejection fraction, by  estimation, is 60 to 65%. The left ventricle has normal function. Left  ventricular endocardial  border not optimally defined to evaluate regional  wall motion. The left ventricular  internal cavity size was normal in size. There is severe asymmetric left  ventricular hypertrophy of the basal-septal segment. Left ventricular  diastolic function could not be evaluated due to atrial fibrillation. Left  ventricular diastolic function could   not be evaluated.   Right Ventricle: The right ventricular size is normal. No increase in  right ventricular wall thickness. Right ventricular systolic function is  normal. There is normal pulmonary artery systolic pressure. The tricuspid  regurgitant velocity is 2.24 m/s, and   with an assumed right atrial pressure of 3 mmHg, the estimated right  ventricular systolic pressure is 23.1 mmHg.   Left Atrium: Left atrial size was mildly dilated.   Right Atrium: Right atrial size was  normal in size.   Pericardium: There is no evidence of pericardial effusion.   Mitral Valve: The mitral valve is grossly normal. Mild mitral annular  calcification. Moderate mitral valve regurgitation. No evidence of mitral  valve stenosis. MV peak gradient, 11.3 mmHg. The mean mitral valve  gradient is 3.0 mmHg.   Tricuspid Valve: The tricuspid valve is grossly normal. Tricuspid valve  regurgitation is mild . No evidence of tricuspid stenosis.   Aortic Valve: The aortic valve is tricuspid. There is mild calcification  of the aortic valve. Aortic valve regurgitation is not visualized. Aortic  valve sclerosis is present, with no evidence of aortic valve stenosis.   Pulmonic Valve: The pulmonic valve was grossly normal. Pulmonic valve  regurgitation is mild. No evidence of pulmonic stenosis.   Aorta: The aortic root and ascending aorta are structurally normal, with  no evidence of dilitation.   Venous: The inferior vena cava is normal in size with greater than 50%  respiratory variability, suggesting right atrial pressure of 3 mmHg.   IAS/Shunts: The atrial  septum is grossly normal.       Assessment & Plan   1.  ***   Thomasene Ripple. Loralyn Rachel NP-C     07/09/2023, 7:21 AM [facility] [address] Office [phone] Fax [phone]    I spent***minutes examining this patient, reviewing medications, and using patient centered shared decision making involving her cardiac care.   I spent greater than 20 minutes reviewing her past medical history,  medications, and prior cardiac tests.

## 2023-07-09 NOTE — Progress Notes (Signed)
Pharmacy Brief Note - Evening Anticoagulation Follow Up:  Pt is a 77 yoM on heparin drip for atrial fibrillation. For full history, see note by Cherylin Mylar, PharmD from earlier today.   Assessment: Confirmatory Heparin level = 0.40 remains therapeutic on heparin infusion of 900 units/hr Confirmed with RN - no signs of bleeding  Goal: Heparin level 0.3 - 0.7  Plan: Continue heparin infusion at current rate of 900 units/hr CBC, heparin level daily Monitor for signs of bleeding  Cindi Carbon, PharmD 07/09/23 2:17 PM

## 2023-07-09 NOTE — Progress Notes (Addendum)
PROGRESS NOTE    Eric Zamora  ZOX:[id number] DOB: August 17, 1946 DOA: 06/15/2023 PCP: Uvaldo Bristle, PA-C    Chief Complaint  Patient presents with   Altered Mental Status    Brief Narrative:  Eric Zamora is a 77 y.o. 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 10/15 admitted to stepdown unit. 10/17: Overnight with fever, aspiration event, new AG acidosis, transferred back to SDU for BiPAP and insulin drip 10/18: taken to OR for drainage of abscess 10/23 cardiology was consulted for new onset A-fib with RVR. 10/24 nephrology was consulted for AKI and hyponatremia.  CT head was obtained for ongoing AMS and was found to have acute/subacute right MCA territory infarct.  Neurology was also consulted. 10/25.  Palliative care was consulted. 10/31.  Anticoagulation started again. 11/1.  Second dose of Bicillin given.  Underwent MBS. 11/2.  Marinol started for poor p.o. intake. 11/4.  Found to have ileus.  GI consulted.  Recommended DNR to legal guardian. 11/5.  Two-physician DNR form was signed and sent to guardian.  Will await decision.   Assessment & Plan:   Principal Problem:   Severe sepsis due to perirectal abscess(HCC) Active Problems:   Atrial fibrillation with rapid ventricular response (HCC)   Diabetic ketoacidosis without coma associated with type 2 diabetes mellitus (HCC)   Acute renal failure superimposed on stage 3b chronic kidney disease (HCC)   Dysphagia   CAD- Moderate LAD disease with moderate to severe ostial first diagonal disease by cath 07/2013 - not ammendable to PCI. 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 -Penrose drain placed and later removed.  No cultures were sent. -Patient noted to remain on IV vancomycin, Flagyl, Rocephin subsequently transitioned to cefepime and has been transition to Augmentin and doxycycline and recommending a total of 2 weeks of antibiotic treatment from initial I&D. -Antibiotic course completed. -Supportive care.  2.  Colonic ileus -Noted on x-ray 07/05/2023 and subsequently confirmed on CT scans. -Patient seen in consultation by GI who recommended conservative measures for now with repletion of electrolytes to keep potassium approximately 4, magnesium approximately 2. -Patient noted to have large watery bowel movement over the past 2 days.  -Patient with large watery bowel movement this morning.   -Patient with clinical improvement, abdomen less distended, softer, diffusely nontender to palpation with large watery stools. -Patient currently on MiraLAX. -Continue Reglan 5 mg IV every 8 hours. -Place rectal tube. -Advance diet to full liquids. -IV fluids. -Palliative care consulted and DNR recommended to legal guardian. -Frequent turns.  3.  New onset A-fib with RVR/HOCM -Patient during the hospitalization to have new onset A-fib with RVR with CHADS2Vasc score of  4. -Patient seen in consultation by cardiology was on metoprolol 100 mg 4 times daily subsequently changed to Toprol-XL. -Patient placed back on IV Lopressor due to colonic ileus. -Was on  Eliquis for anticoagulation now on IV heparin due to ileus. -Likely resume Eliquis once ileus has resolved.  4.  Acute metabolic encephalopathy/acute right MCA stroke/history of dementia -Patient noted to have a progressive worsening mentation early on in the hospitalization. -CT head done 06/24/2023 with acute/subacute cortical and subcortical right MCA territory infarct. -MRI brain done consistent with acute infarct. -MRA brain done with no acute abnormalities. -Carotid Dopplers done with no significant ICA stenosis. -EEG done negative for seizures. -Repeat head CT done on 07/01/2023 per neurology recommendations showed no hemorrhagic conversion and as such neurology recommended resumption of anticoagulation with Eliquis. -Patient being followed by SLP was on dysphagia 1 diet however currently on clear liquids due to colonic ileus. -Advance diet to full liquid diet. -PT/OT following and recommending SNF placement.  5.  History of seizures -EEG done negative for seizures/epileptiform activity. -Depakote on hold due to hepatic function. -Was on Keppra which is currently on hold.  6.  Moderate MR -Monitor volume status.  7.  Dysphagia -Secondary to acute CVA. -Being followed by SLP, underwent MBS was on a dysphagia 1 diet and currently on clear liquids due to colonic ileus.  -Advance to full liquid diet. -Once ileus resolved will resume dysphagia 1 diet. -SLP following.  8.  Severe hypokalemia/hypocalcemia -Potassium at 2.3 this morning, magnesium at 1.9. -Corrected calcium at 8.0. -Change IV fluids to half-normal saline with 40 mEq of KCl at 100 cc/h.   -KCl 10 mEq IV every hour x 6 runs. -Potassium packet 60 mEq p.o. x 1 given earlier on this morning.   -Potassium packet 40 mEq p.o. every 4 hours x 2 doses. \ -Calcium gluconate 2 g IV x 1 -Repeat labs in the AM.  9.  Hypernatremia -Likely secondary to hypovolemic hypernatremia secondary to dehydration. -Improved with  hydration and change of IV fluids to D5W. -Change D5W to half-normal saline at 100 cc/h.   -Monitor while on bicarb drip.   -Repeat labs in the AM.  10.  Possible thrush/possible pill induced gastritis -Patient completed course of antibiotics. -Was on Diflucan through 07/08/2023. -Status post completion of nystatin swish and swallow.  11.  History of syphilis -RPR reactive for 2 years. -Dr. Allena Katz discussed with ID and patient currently being treated with Bicillin on a weekly basis.  With next dose 07/09/2023.  12.  AKI on CKD 3A -Baseline creatinine approximately 1.3 and noted to have worsening to 1.99. -Improved with hydration creatinine currently at 1.24. -Continue IV fluids.    13.  None anion gap metabolic acidosis -Likely secondary to CKD. -Patient with a worsening acidosis and as such patient started on a bicarb drip which we will continue.   -Repeat labs in the AM.   14.  CAD -Patient not on DAPT or antiplatelets secondary to being on Eliquis. -Outpatient follow-up with cardiology.  15.  Bipolar disorder -Not on any medications prior to admission. -Stable.  16.  Poor oral intake -Likely multifactorial secondary to acute CVA and colonic ileus. -Was initially placed on Marinol which is currently on hold due to ileus.  17.  Goals of care conversation -Dr. Allena Katz discussed with legal guardian on the phone. -Patient noted with poor prognosis due to large right MCA infarct with deficits and dysphagia with dysarthria. -Patient noted with poor oral intake and now with  colonic ileus currently under conservative treatment. -It is felt with acute CVA patient likely will not return to his prior status prior to admission. -It is felt that the patient has a cardiac arrest prognosis remains very poor to return to his prior status prior to arrest. -Patient with poor quality of life and Dr. Allena Katz recommended legal guardian to consider DNR for patient. -Palliative care  following.     DVT prophylaxis: Heparin Code Status: DNR Family Communication: Updated patient.  No family at bedside. Disposition: SNF once ileus has resolved and clinically improved.  Status is: Inpatient Remains inpatient appropriate because: Severity of illness   Consultants:  General surgery: Dr. Derrell Lolling 06/18/2023 Gastroenterology: Dr. Ewing Schlein 07/05/2023 Cardiology: Dr. Royann Shivers 06/23/2023 Nephrology: Dr. Ronalee Belts 06/24/2023 Neurology: Dr.Bhagat 06/24/2023 Palliative care: Dr. Patterson Hammersmith 06/25/2023  Procedures:  CT abdomen and pelvis 07/05/2023 CT head 06/15/2023, 06/24/2023, 07/01/2023 Chest x-ray 06/15/2023, 06/17/2023 Modified barium swallow 06/19/2023 Abdominal films 07/05/2023, 07/06/2023 MRI brain 06/25/2023 MRA head 06/25/2023 Right upper quadrant ultrasound 06/22/2023 2D echo 06/16/2023 Carotid Dopplers 06/25/2023 CT chest abdomen and pelvis 06/17/2023 EEG 06/25/2023 Irrigation and debridement perirectal abscess per general surgery: Dr. Derrell Lolling 06/18/2023 PICC line placement 07/08/2023  Antimicrobials:  Anti-infectives (From admission, onward)    Start     Dose/Rate Route Frequency Ordered Stop   07/05/23 1600  fluconazole (DIFLUCAN) IVPB 200 mg        200 mg 100 mL/hr over 60 Minutes Intravenous Every 24 hours 07/05/23 1320 07/08/23 1658   07/02/23 1000  fluconazole (DIFLUCAN) tablet 100 mg  Status:  Discontinued        100 mg Oral Daily 07/02/23 0802 07/05/23 1320   07/02/23 0800  penicillin g benzathine (BICILLIN LA) 1200000 UNIT/2ML injection 2.4 Million Units        2.4 Million Units Intramuscular Weekly 07/01/23 1209 07/09/23 0950   06/26/23 1500  penicillin g benzathine (BICILLIN LA) 1200000 UNIT/2ML injection 2.4 Million Units        2.4 Million Units Intramuscular  Once 06/26/23 1351 06/28/23 0018   06/24/23 2200  amoxicillin-clavulanate (AUGMENTIN) 500-125 MG per tablet 1 tablet  Status:  Discontinued        1 tablet Oral Every 12 hours 06/24/23 1450  07/03/23 1202   06/24/23 2200  amoxicillin-clavulanate (AUGMENTIN) 500-125 MG per tablet 1 tablet  Status:  Discontinued        1 tablet Oral 2 times daily 06/24/23 1451 06/24/23 1451   06/21/23 1215  doxycycline (VIBRA-TABS) tablet 100 mg  Status:  Discontinued        100 mg Oral Every 12 hours 06/21/23 1128 07/03/23 1202   06/21/23 1215  amoxicillin-clavulanate (AUGMENTIN) 875-125 MG per tablet 1 tablet  Status:  Discontinued        1 tablet Oral Every 12 hours 06/21/23 1128 06/24/23 1450   06/20/23 1200  vancomycin (VANCOREADY) IVPB 1500 mg/300 mL  Status:  Discontinued        1,500 mg 150 mL/hr over 120 Minutes Intravenous Every 36 hours 06/20/23 0719 06/21/23 1128   06/19/23 0000  vancomycin (VANCOREADY) IVPB 1250 mg/250 mL  Status:  Discontinued        1,250 mg 166.7 mL/hr over 90 Minutes Intravenous Every 36 hours 06/18/23 0939 06/20/23 0719   06/18/23 1800  ceFEPIme (MAXIPIME) 2 g in sodium chloride 0.9 % 100 mL IVPB  Status:  Discontinued        2 g 200 mL/hr over 30 Minutes Intravenous Every 12 hours 06/18/23 0939 06/21/23 1128  06/18/23 0600  ceFEPIme (MAXIPIME) 2 g in sodium chloride 0.9 % 100 mL IVPB  Status:  Discontinued        2 g 200 mL/hr over 30 Minutes Intravenous Every 24 hours 06/17/23 0920 06/18/23 0939   06/17/23 1400  vancomycin (VANCOCIN) IVPB 1000 mg/200 mL premix  Status:  Discontinued        1,000 mg 200 mL/hr over 60 Minutes Intravenous Every 36 hours 06/17/23 0920 06/18/23 0939   06/16/23 1800  vancomycin (VANCOREADY) IVPB 750 mg/150 mL  Status:  Discontinued        750 mg 150 mL/hr over 60 Minutes Intravenous Every 24 hours 06/15/23 1639 06/17/23 0920   06/16/23 1100  cefTRIAXone (ROCEPHIN) 2 g in sodium chloride 0.9 % 100 mL IVPB  Status:  Discontinued        2 g 200 mL/hr over 30 Minutes Intravenous Every 24 hours 06/15/23 1453 06/15/23 1455   06/15/23 1800  ceFEPIme (MAXIPIME) 2 g in sodium chloride 0.9 % 100 mL IVPB  Status:  Discontinued        2  g 200 mL/hr over 30 Minutes Intravenous Every 12 hours 06/15/23 1558 06/17/23 0920   06/15/23 1700  metroNIDAZOLE (FLAGYL) IVPB 500 mg  Status:  Discontinued        500 mg 100 mL/hr over 60 Minutes Intravenous Every 12 hours 06/15/23 1455 06/21/23 1128   06/15/23 1630  vancomycin (VANCOREADY) IVPB 1500 mg/300 mL        1,500 mg 150 mL/hr over 120 Minutes Intravenous  Once 06/15/23 1558 06/15/23 2058   06/15/23 1045  cefTRIAXone (ROCEPHIN) 2 g in sodium chloride 0.9 % 100 mL IVPB        2 g 200 mL/hr over 30 Minutes Intravenous  Once 06/15/23 1041 06/15/23 1155         Subjective: Patient laying in bed.  Denies any chest pain or shortness of breath.  Denies any abdominal pain.  Noted to have 3 large BMs overnight.  Noted to have had a large watery stool this morning in the bed.  Tolerating current clear liquids.  Objective: Vitals:   07/09/23 0001 07/09/23 0553 07/09/23 0600 07/09/23 0836  BP: 102/80 123/74  99/66  Pulse: 77 (!) 58 (!) 118 84  Resp:      Temp:  97.7 F (36.5 C)  97.9 F (36.6 C)  TempSrc:    Oral  SpO2:  100%  99%  Weight:   79 kg   Height:        Intake/Output Summary (Last 24 hours) at 07/09/2023 1215 Last data filed at 07/09/2023 1104 Gross per 24 hour  Intake 6417.99 ml  Output 1300 ml  Net 5117.99 ml   Filed Weights   07/07/23 0707 07/08/23 0523 07/09/23 0600  Weight: 80.1 kg 81.8 kg 79 kg    Examination:  General exam: NAD. Respiratory system: Lungs clear to auscultation bilaterally anterior lung fields.  No wheezes, no crackles, no rhonchi.  Fair air movement.  Cardiovascular system: RRR no murmurs rubs or gallops.  No JVD.  No pitting lower extremity edema.   Gastrointestinal system: Abdomen is obese, less distended, softer, positive bowel sounds.  Nontender to palpation.  No rebound.  No guarding.  Central nervous system: Alert and oriented.  Dysarthric speech.  Left facial weakness.  4/5 bilateral upper extremity strength.  4/5 bilateral  lower extremity strength.   Extremities: Symmetric 5 x 5 power. Skin: No rashes, lesions or ulcers Psychiatry: Judgement and  insight appear fair. Mood & affect appropriate.     Data Reviewed: I have personally reviewed following labs and imaging studies  CBC: Recent Labs  Lab 07/03/23 0615 07/05/23 0503 07/06/23 0518 07/07/23 0700 07/08/23 0548 07/09/23 0300  WBC 7.8 8.7 7.5 7.6 7.3 7.1  NEUTROABS 5.3  --   --   --   --   --   HGB 11.9* 13.4 13.5 12.7* 11.6* 10.2*  HCT 37.5* 42.7 42.8 41.4 37.3* 31.0*  MCV 90.1 92.2 91.6 94.5 92.8 89.9  PLT 201 282 267 277 231 202    Basic Metabolic Panel: Recent Labs  Lab 07/05/23 0503 07/05/23 1504 07/06/23 0518 07/06/23 1452 07/06/23 2240 07/07/23 0523 07/07/23 0700 07/08/23 0548 07/09/23 0300  NA 145   < > 149* 148* 151*  --  150* 143 138  K 2.9*   < > 2.6* 4.0 2.8*  --  2.4* 2.7* 2.3*  CL 116*   < > 119* 124* 128*  --  127* 119* 112*  CO2 18*   < > 17* 16* 15*  --  15* 14* 20*  GLUCOSE 203*   < > 124* 131* 130*  --  109* 121* 123*  BUN 30*   < > 32* 31* 27*  --  25* 17 11  CREATININE 1.35*   < > 1.62* 1.57* 1.45*  --  1.51* 1.48* 1.24  CALCIUM 9.0   < > 8.9 8.2* 7.9*  --  7.9* 7.6* 6.8*  MG 2.6*  --  2.7*  --   --  2.3  --  2.4 1.9   < > = values in this interval not displayed.    GFR: Estimated Creatinine Clearance: 47.6 mL/min (by C-G formula based on SCr of 1.24 mg/dL).  Liver Function Tests: Recent Labs  Lab 07/03/23 0615 07/08/23 0548  AST 30 25  ALT 24 24  ALKPHOS 31* 35*  BILITOT 0.7 0.6  PROT 6.8 6.3*  ALBUMIN 2.6* 2.5*    CBG: Recent Labs  Lab 07/08/23 1617 07/08/23 2004 07/09/23 0000 07/09/23 0354 07/09/23 0754  GLUCAP 142* 118* 109* 110* 131*     No results found for this or any previous visit (from the past 240 hour(s)).       Radiology Studies: DG Abd Portable 1V  Result Date: 07/08/2023 CLINICAL DATA:  Ileus. EXAM: PORTABLE ABDOMEN - 1 VIEW COMPARISON:  July 06, 2023.  FINDINGS: Stable diffuse colonic dilatation is noted consistent with ileus. No small bowel dilatation is noted. IMPRESSION: Stable diffuse colonic dilatation consistent with ileus. Electronically Signed   By: Lupita Raider M.D.   On: 07/08/2023 12:25   DG Chest Port 1 View  Result Date: 07/08/2023 CLINICAL DATA:  Status post PICC placement. EXAM: PORTABLE CHEST 1 VIEW COMPARISON:  June 17, 2023. FINDINGS: The heart size and mediastinal contours are within normal limits. Interval placement of right-sided PICC line with distal tip in expected position of cavoatrial junction. Both lungs are clear. The visualized skeletal structures are unremarkable. IMPRESSION: Interval placement of right-sided PICC line with distal tip in expected position of cavoatrial junction. Electronically Signed   By: Lupita Raider M.D.   On: 07/08/2023 12:24   [place] EKG SITE RITE  Result Date: 07/07/2023 If Site Rite image not attached, placement could not be confirmed due to current cardiac rhythm.       Scheduled Meds:  Chlorhexidine Gluconate Cloth  6 each Topical Daily   insulin aspart  0-15 Units Subcutaneous Q4H  insulin glargine-yfgn  8 Units Subcutaneous QHS   magic mouthwash  10 mL Oral QID   metoCLOPramide (REGLAN) injection  5 mg Intravenous Q8H   metoprolol tartrate  5 mg Intravenous Q6H   mouth rinse  15 mL Mouth Rinse 4 times per day   pantoprazole (PROTONIX) IV  40 mg Intravenous Q24H   polyethylene glycol  17 g Oral BID   potassium chloride  40 mEq Oral Q4H   simethicone  40 mg Oral QID   sodium chloride flush  3 mL Intravenous Q12H   Continuous Infusions:  heparin 900 Units/hr (07/09/23 1104)   sodium bicarbonate 150 mEq in dextrose 5 % 1,150 mL infusion 125 mL/hr at 07/09/23 0453   sodium chloride 0.45 % 1,000 mL with potassium chloride 40 mEq infusion 100 mL/hr at 07/09/23 0946     LOS: 24 days    Time spent: 40 minutes    Ramiro Harvest, MD [facility]   To contact  the attending provider between 7A-7P or the covering provider during after hours 7P-7A, please log into the web site [email or web address] and access using universal [place] password for that web site. If you do not have the password, please call the hospital operator.  07/09/2023, 12:15 PM

## 2023-07-09 NOTE — Plan of Care (Signed)
No acute events this shift. The patient has rested comfortably throughout the shift. He has been turned frequently. A fecal management system was inserted due to frequent large and watery stools. His heart rate remains elevated, but VS are otherwise stable. Fall precautions in place. Will continue to monitor.  Problem: Education: Goal: Ability to describe self-care measures that may prevent or decrease complications (Diabetes Survival Skills Education) will improve Outcome: Progressing Goal: Individualized Educational Video(s) Outcome: Progressing   Problem: Coping: Goal: Ability to adjust to condition or change in health will improve Outcome: Progressing   Problem: Fluid Volume: Goal: Ability to maintain a balanced intake and output will improve Outcome: Progressing   Problem: Health Behavior/Discharge Planning: Goal: Ability to identify and utilize available resources and services will improve Outcome: Progressing Goal: Ability to manage health-related needs will improve Outcome: Progressing   Problem: Metabolic: Goal: Ability to maintain appropriate glucose levels will improve Outcome: Progressing   Problem: Nutritional: Goal: Maintenance of adequate nutrition will improve Outcome: Progressing Goal: Progress toward achieving an optimal weight will improve Outcome: Progressing   Problem: Skin Integrity: Goal: Risk for impaired skin integrity will decrease Outcome: Progressing   Problem: Tissue Perfusion: Goal: Adequacy of tissue perfusion will improve Outcome: Progressing   Problem: Education: Goal: Knowledge of General Education information will improve Description: Including pain rating scale, medication(s)/side effects and non-pharmacologic comfort measures Outcome: Progressing   Problem: Health Behavior/Discharge Planning: Goal: Ability to manage health-related needs will improve Outcome: Progressing   Problem: Clinical Measurements: Goal: Ability to maintain  clinical measurements within normal limits will improve Outcome: Progressing Goal: Will remain free from infection Outcome: Progressing Goal: Diagnostic test results will improve Outcome: Progressing Goal: Respiratory complications will improve Outcome: Progressing Goal: Cardiovascular complication will be avoided Outcome: Progressing   Problem: Activity: Goal: Risk for activity intolerance will decrease Outcome: Progressing   Problem: Nutrition: Goal: Adequate nutrition will be maintained Outcome: Progressing   Problem: Coping: Goal: Level of anxiety will decrease Outcome: Progressing   Problem: Elimination: Goal: Will not experience complications related to bowel motility Outcome: Progressing Goal: Will not experience complications related to urinary retention Outcome: Progressing   Problem: Pain Managment: Goal: General experience of comfort will improve Outcome: Progressing   Problem: Safety: Goal: Ability to remain free from injury will improve Outcome: Progressing   Problem: Skin Integrity: Goal: Risk for impaired skin integrity will decrease Outcome: Progressing   Problem: Fluid Volume: Goal: Hemodynamic stability will improve Outcome: Progressing   Problem: Clinical Measurements: Goal: Diagnostic test results will improve Outcome: Progressing Goal: Signs and symptoms of infection will decrease Outcome: Progressing   Problem: Respiratory: Goal: Ability to maintain adequate ventilation will improve Outcome: Progressing   Problem: Education: Goal: Ability to describe self-care measures that may prevent or decrease complications (Diabetes Survival Skills Education) will improve Outcome: Progressing Goal: Individualized Educational Video(s) Outcome: Progressing   Problem: Cardiac: Goal: Ability to maintain an adequate cardiac output will improve Outcome: Progressing   Problem: Health Behavior/Discharge Planning: Goal: Ability to identify and  utilize available resources and services will improve Outcome: Progressing Goal: Ability to manage health-related needs will improve Outcome: Progressing   Problem: Fluid Volume: Goal: Ability to achieve a balanced intake and output will improve Outcome: Progressing   Problem: Metabolic: Goal: Ability to maintain appropriate glucose levels will improve Outcome: Progressing   Problem: Nutritional: Goal: Maintenance of adequate nutrition will improve Outcome: Progressing Goal: Maintenance of adequate weight for body size and type will improve Outcome: Progressing   Problem: Respiratory:  Goal: Will regain and/or maintain adequate ventilation Outcome: Progressing   Problem: Urinary Elimination: Goal: Ability to achieve and maintain adequate renal perfusion and functioning will improve Outcome: Progressing   Problem: Education: Goal: Knowledge of disease or condition will improve Outcome: Progressing Goal: Knowledge of secondary prevention will improve (MUST DOCUMENT ALL) Outcome: Progressing Goal: Knowledge of patient specific risk factors will improve Loraine Leriche N/A or DELETE if not current risk factor) Outcome: Progressing   Problem: Ischemic Stroke/TIA Tissue Perfusion: Goal: Complications of ischemic stroke/TIA will be minimized Outcome: Progressing

## 2023-07-09 NOTE — Plan of Care (Signed)
  Problem: Coping: Goal: Ability to adjust to condition or change in health will improve Outcome: Progressing   Problem: Skin Integrity: Goal: Risk for impaired skin integrity will decrease Outcome: Progressing   Problem: Fluid Volume: Goal: Ability to maintain a balanced intake and output will improve Outcome: Not Progressing

## 2023-07-09 NOTE — Plan of Care (Signed)
  Problem: Skin Integrity: Goal: Risk for impaired skin integrity will decrease Outcome: Progressing   Problem: Coping: Goal: Ability to adjust to condition or change in health will improve 07/09/2023 0212 by Jule Ser, RN Outcome: Not Progressing 07/09/2023 0116 by Jule Ser, RN Outcome: Progressing   Problem: Fluid Volume: Goal: Ability to maintain a balanced intake and output will improve 07/09/2023 0212 by Jule Ser, RN Outcome: Not Progressing 07/09/2023 0116 by Jule Ser, RN Outcome: Not Progressing

## 2023-07-09 NOTE — Progress Notes (Signed)
PHARMACY - ANTICOAGULATION CONSULT NOTE  Pharmacy Consult for IV heparin Indication: atrial fibrillation  No Known Allergies  Patient Measurements: Height: 5\' 5"  (165.1 cm) Weight: 81.8 kg (180 lb 5.4 oz) IBW/kg (Calculated) : 61.5 Heparin Dosing Weight: 80.7 kg   Vital Signs: Temp: 99.4 F (37.4 C) (11/07 2048) Temp Source: Oral (11/07 2048) BP: 102/80 (11/08 0001) Pulse Rate: 77 (11/08 0001)  Labs: Recent Labs    07/06/23 0518 07/06/23 1452 07/06/23 2240 07/07/23 0523 07/07/23 0700 07/07/23 2037 07/08/23 0548 07/08/23 0933 07/08/23 2140  HGB 13.5  --   --   --  12.7*  --  11.6*  --   --   HCT 42.8  --   --   --  41.4  --  37.3*  --   --   PLT 267  --   --   --  277  --  231  --   --   APTT 51*   < >  --  90*  --    < > 184* 188* 141*  HEPARINUNFRC >1.10*  --   --  >1.10*  --   --  >1.10*  --   --   CREATININE 1.62*   < > 1.45*  --  1.51*  --  1.48*  --   --    < > = values in this interval not displayed.    Estimated Creatinine Clearance: 40.5 mL/min (A) (by C-G formula based on SCr of 1.48 mg/dL (H)).   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   06/15/2023 at am   ergocalciferol (VITAMIN D2) 1.25 MG (50000 UT) capsule Take 50,000 Units by mouth every 30 (thirty) days. On the 7 th of every month.   06/07/2023 at am   furosemide (LASIX) 40 MG tablet Take 1 tablet (40 mg total) by mouth daily. 30 tablet  06/15/2023 at am   latanoprost (XALATAN) 0.005 % ophthalmic solution Place 1 drop into both eyes at bedtime.   06/15/2023 at pm   levETIRAcetam (KEPPRA)  500 MG tablet Take 500 mg by mouth 2 (two) times daily.   06/15/2023 at pm   metoprolol tartrate (LOPRESSOR) 25 MG tablet Take 1 tablet (25 mg total) by mouth 2 (two) times daily.   06/15/2023 at pm   PARoxetine (PAXIL) 10 MG tablet Take 10 mg by mouth daily.   06/15/2023 at am   potassium chloride SA (K-DUR,KLOR-CON) 20 MEQ tablet Take 2 tablets (40 mEq total) by mouth daily.   06/15/2023 at am   simvastatin (ZOCOR) 20 MG tablet Take 20 mg by mouth at bedtime.   06/15/2023 at pm    Assessment: Pharmacy is consulted to start hepairn drip on 77 yo male diagnosed with atrial fibrillation. Pt has been changed to heparin drip. Last dose of apixaban at 0904 on 11/4.   Today, 07/09/23  aPTT 141 sec: remains supra-therapeutic but improved on heparin gtt @ lowered rate of 1100 units/hr Verified lab drawn from PICC line by IV RN and heparin is  infusing peripherally Heparin level > 1.1, remains falsely elevated due to recent DOAC use CBC: Hg slightly low at 11.6, pltc WNL at 231 No bleeding or infusion related complications reported by RN  Goal of Therapy:  Heparin level 0.3-0.7 units/ml aPTT 66-102 sec  Monitor platelets by anticoagulation protocol: Yes   Plan:  Stop heparin for one hour and resume at decreased heparin rate of 900 units/hr Recheck aptt 8 hours after rate change Will monitor using aPTT until HL and aPTT start to correlate Daily heparin level & CBC  Monitor for signs and symptoms of bleeding  Junita Push, PharmD, BCPS 07/09/2023 1:05 AM

## 2023-07-09 NOTE — Progress Notes (Signed)
PT Cancellation Note  Patient Details Name: Eric Zamora MRN: [id number] DOB: 1945/11/08   Cancelled Treatment:    Reason Eval/Treat Not Completed: Medical issues which prohibited therapy. Pt's potassium 2.3 upon chart review and HR 110-130s while supine in bed at rest. Will continue to follow for acute PT.    Domenick Bookbinder PT, DPT 07/09/23, 3:42 PM

## 2023-07-09 NOTE — Progress Notes (Signed)
Pt has 3x big loose BM since 1900. Miralax evening dose was held. Donnal Moat, NP thru secure chat for C. Diff test. Received a response thru secure chat to hold C diff testing for now. Will continue to monitor.

## 2023-07-09 NOTE — Progress Notes (Signed)
PHARMACY - ANTICOAGULATION CONSULT NOTE  Pharmacy Consult for IV heparin Indication: atrial fibrillation  No Known Allergies  Patient Measurements: Height: 5\' 5"  (165.1 cm) Weight: 79 kg (174 lb 2.6 oz) IBW/kg (Calculated) : 61.5 Heparin Dosing Weight: 80.7 kg   Vital Signs: Temp: 97.9 F (36.6 C) (11/08 0836) Temp Source: Oral (11/08 0836) BP: 99/66 (11/08 0836) Pulse Rate: 84 (11/08 0836)  Labs: Recent Labs    07/06/23 2240 07/07/23 0523 07/07/23 0700 07/07/23 2037 07/08/23 0548 07/08/23 0933 07/08/23 2140 07/09/23 0300 07/09/23 1014  HGB   < >  --  12.7*  --  11.6*  --   --  10.2*  --   HCT  --   --  41.4  --  37.3*  --   --  31.0*  --   PLT  --   --  277  --  231  --   --  202  --   APTT  --  90*  --    < > 184* 188* 141*  --  78*  HEPARINUNFRC  --  >1.10*  --   --  >1.10*  --   --   --  0.47  CREATININE  --   --  1.51*  --  1.48*  --   --  1.24  --    < > = values in this interval not displayed.    Estimated Creatinine Clearance: 47.6 mL/min (by C-G formula based on SCr of 1.24 mg/dL).   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   06/15/2023 at am   ergocalciferol (VITAMIN D2) 1.25 MG (50000 UT) capsule Take 50,000 Units by mouth every 30 (thirty) days. On the 7 th of every month.   06/07/2023 at am   furosemide (LASIX) 40 MG tablet Take 1 tablet (40 mg total) by mouth daily. 30 tablet  06/15/2023 at am   latanoprost (XALATAN) 0.005 % ophthalmic solution Place 1 drop into both eyes at bedtime.   06/15/2023 at pm   levETIRAcetam (KEPPRA) 500 MG tablet  Take 500 mg by mouth 2 (two) times daily.   06/15/2023 at pm   metoprolol tartrate (LOPRESSOR) 25 MG tablet Take 1 tablet (25 mg total) by mouth 2 (two) times daily.   06/15/2023 at pm   PARoxetine (PAXIL) 10 MG tablet Take 10 mg by mouth daily.   06/15/2023 at am   potassium chloride SA (K-DUR,KLOR-CON) 20 MEQ tablet Take 2 tablets (40 mEq total) by mouth daily.   06/15/2023 at am   simvastatin (ZOCOR) 20 MG tablet Take 20 mg by mouth at bedtime.   06/15/2023 at pm    Assessment: Pharmacy is consulted to start hepairn drip on 77 yo male diagnosed with atrial fibrillation. Pt has been changed to heparin drip. Last dose of apixaban at 0904 on 11/4.   Today, 07/09/23  aPTT 78 sec: now therapeutic on heparin 900 units/hr Heparin level 0.47: now therapeutic and appears to be correlating with aPTT CBC: Hgb continuing to down-trend at 10.2, pltc  WNL at 202--stable and pharmacy will continue to monitor  No bleeding or infusion related complications reported  Goal of Therapy:  Heparin level 0.3-0.7 units/ml aPTT 66-102 sec  Monitor platelets by anticoagulation protocol: Yes   Plan:  Continue heparin rate of 900 units/hr Check confirmatory heparin level in 8hrs Daily heparin level & CBC  Monitor for signs and symptoms of bleeding   Cherylin Mylar, PharmD Clinical Pharmacist  11/8/202410:58 AM

## 2023-07-10 ENCOUNTER — Inpatient Hospital Stay (HOSPITAL_COMMUNITY): Payer: Medicare (Managed Care)

## 2023-07-10 DIAGNOSIS — A419 Sepsis, unspecified organism: Secondary | ICD-10-CM | POA: Diagnosis not present

## 2023-07-10 DIAGNOSIS — I4891 Unspecified atrial fibrillation: Secondary | ICD-10-CM | POA: Diagnosis not present

## 2023-07-10 DIAGNOSIS — R4182 Altered mental status, unspecified: Secondary | ICD-10-CM | POA: Diagnosis not present

## 2023-07-10 DIAGNOSIS — R1310 Dysphagia, unspecified: Secondary | ICD-10-CM | POA: Diagnosis not present

## 2023-07-10 LAB — RENAL FUNCTION PANEL
Albumin: 2.3 g/dL — ABNORMAL LOW (ref 3.5–5.0)
Anion gap: 6 (ref 5–15)
BUN: 6 mg/dL — ABNORMAL LOW (ref 8–23)
CO2: 25 mmol/L (ref 22–32)
Calcium: 7.1 mg/dL — ABNORMAL LOW (ref 8.9–10.3)
Chloride: 103 mmol/L (ref 98–111)
Creatinine, Ser: 1.2 mg/dL (ref 0.61–1.24)
GFR, Estimated: >60 mL/min (ref >60)
Glucose, Bld: 143 mg/dL — ABNORMAL HIGH (ref 70–99)
Phosphorus: 1.4 mg/dL — ABNORMAL LOW (ref 2.5–4.6)
Potassium: 2.9 mmol/L — ABNORMAL LOW (ref 3.5–5.1)
Sodium: 134 mmol/L — ABNORMAL LOW (ref 135–145)

## 2023-07-10 LAB — CBC
HCT: 31.7 % — ABNORMAL LOW (ref 39.0–52.0)
Hemoglobin: 10.4 g/dL — ABNORMAL LOW (ref 13.0–17.0)
MCH: 29.1 pg (ref 26.0–34.0)
MCHC: 32.8 g/dL (ref 30.0–36.0)
MCV: 88.8 fL (ref 80.0–100.0)
Platelets: 198 10*3/uL (ref 150–400)
RBC: 3.57 MIL/uL — ABNORMAL LOW (ref 4.22–5.81)
RDW: 15.2 % (ref 11.5–15.5)
WBC: 6.3 10*3/uL (ref 4.0–10.5)
nRBC: 0 % (ref 0.0–0.2)

## 2023-07-10 LAB — GLUCOSE, CAPILLARY
Glucose-Capillary: 114 mg/dL — ABNORMAL HIGH (ref 70–99)
Glucose-Capillary: 116 mg/dL — ABNORMAL HIGH (ref 70–99)
Glucose-Capillary: 118 mg/dL — ABNORMAL HIGH (ref 70–99)
Glucose-Capillary: 150 mg/dL — ABNORMAL HIGH (ref 70–99)
Glucose-Capillary: 153 mg/dL — ABNORMAL HIGH (ref 70–99)
Glucose-Capillary: 161 mg/dL — ABNORMAL HIGH (ref 70–99)
Glucose-Capillary: 171 mg/dL — ABNORMAL HIGH (ref 70–99)
Glucose-Capillary: 97 mg/dL (ref 70–99)

## 2023-07-10 LAB — MAGNESIUM: Magnesium: 2 mg/dL (ref 1.7–2.4)

## 2023-07-10 LAB — HEPARIN LEVEL (UNFRACTIONATED): Heparin Unfractionated: 0.31 [IU]/mL (ref 0.30–0.70)

## 2023-07-10 MED ORDER — POTASSIUM CHLORIDE 10 MEQ/100ML IV SOLN
10.0000 meq | INTRAVENOUS | Status: AC
  Administered 2023-07-10 (×6): 10 meq via INTRAVENOUS
  Filled 2023-07-10 (×6): qty 100

## 2023-07-10 MED ORDER — CALCIUM GLUCONATE-NACL 2-0.675 GM/100ML-% IV SOLN
2.0000 g | Freq: Once | INTRAVENOUS | Status: AC
  Administered 2023-07-10: 2000 mg via INTRAVENOUS
  Filled 2023-07-10: qty 100

## 2023-07-10 MED ORDER — POTASSIUM PHOSPHATES 15 MMOLE/5ML IV SOLN
30.0000 mmol | Freq: Once | INTRAVENOUS | Status: AC
  Administered 2023-07-10: 30 mmol via INTRAVENOUS
  Filled 2023-07-10: qty 10

## 2023-07-10 MED ORDER — KCL IN DEXTROSE-NACL 40-5-0.9 MEQ/L-%-% IV SOLN
INTRAVENOUS | Status: AC
  Filled 2023-07-10 (×2): qty 1000

## 2023-07-10 NOTE — Plan of Care (Signed)
Problem: Education: Goal: Ability to describe self-care measures that may prevent or decrease complications (Diabetes Survival Skills Education) will improve Outcome: Progressing Goal: Individualized Educational Video(s) Outcome: Progressing   Problem: Coping: Goal: Ability to adjust to condition or change in health will improve Outcome: Progressing   Problem: Fluid Volume: Goal: Ability to maintain a balanced intake and output will improve Outcome: Progressing   Problem: Health Behavior/Discharge Planning: Goal: Ability to identify and utilize available resources and services will improve Outcome: Progressing Goal: Ability to manage health-related needs will improve Outcome: Progressing   Problem: Metabolic: Goal: Ability to maintain appropriate glucose levels will improve Outcome: Progressing   Problem: Nutritional: Goal: Maintenance of adequate nutrition will improve Outcome: Progressing Goal: Progress toward achieving an optimal weight will improve Outcome: Progressing   Problem: Skin Integrity: Goal: Risk for impaired skin integrity will decrease Outcome: Progressing   Problem: Tissue Perfusion: Goal: Adequacy of tissue perfusion will improve Outcome: Progressing   Problem: Education: Goal: Knowledge of General Education information will improve Description: Including pain rating scale, medication(s)/side effects and non-pharmacologic comfort measures Outcome: Progressing   Problem: Health Behavior/Discharge Planning: Goal: Ability to manage health-related needs will improve Outcome: Progressing   Problem: Clinical Measurements: Goal: Ability to maintain clinical measurements within normal limits will improve Outcome: Progressing Goal: Will remain free from infection Outcome: Progressing Goal: Diagnostic test results will improve Outcome: Progressing Goal: Respiratory complications will improve Outcome: Progressing Goal: Cardiovascular complication will  be avoided Outcome: Progressing   Problem: Activity: Goal: Risk for activity intolerance will decrease Outcome: Progressing   Problem: Nutrition: Goal: Adequate nutrition will be maintained Outcome: Progressing   Problem: Coping: Goal: Level of anxiety will decrease Outcome: Progressing   Problem: Elimination: Goal: Will not experience complications related to bowel motility Outcome: Progressing Goal: Will not experience complications related to urinary retention Outcome: Progressing   Problem: Pain Managment: Goal: General experience of comfort will improve Outcome: Progressing   Problem: Safety: Goal: Ability to remain free from injury will improve Outcome: Progressing   Problem: Skin Integrity: Goal: Risk for impaired skin integrity will decrease Outcome: Progressing   Problem: Fluid Volume: Goal: Hemodynamic stability will improve Outcome: Progressing   Problem: Clinical Measurements: Goal: Diagnostic test results will improve Outcome: Progressing Goal: Signs and symptoms of infection will decrease Outcome: Progressing   Problem: Respiratory: Goal: Ability to maintain adequate ventilation will improve Outcome: Progressing   Problem: Education: Goal: Ability to describe self-care measures that may prevent or decrease complications (Diabetes Survival Skills Education) will improve Outcome: Progressing Goal: Individualized Educational Video(s) Outcome: Progressing   Problem: Cardiac: Goal: Ability to maintain an adequate cardiac output will improve Outcome: Progressing   Problem: Health Behavior/Discharge Planning: Goal: Ability to identify and utilize available resources and services will improve Outcome: Progressing Goal: Ability to manage health-related needs will improve Outcome: Progressing   Problem: Fluid Volume: Goal: Ability to achieve a balanced intake and output will improve Outcome: Progressing   Problem: Metabolic: Goal: Ability to  maintain appropriate glucose levels will improve Outcome: Progressing   Problem: Nutritional: Goal: Maintenance of adequate nutrition will improve Outcome: Progressing Goal: Maintenance of adequate weight for body size and type will improve Outcome: Progressing   Problem: Respiratory: Goal: Will regain and/or maintain adequate ventilation Outcome: Progressing   Problem: Urinary Elimination: Goal: Ability to achieve and maintain adequate renal perfusion and functioning will improve Outcome: Progressing   Problem: Education: Goal: Knowledge of disease or condition will improve Outcome: Progressing Goal: Knowledge of secondary prevention will improve (  MUST DOCUMENT ALL) Outcome: Progressing Goal: Knowledge of patient specific risk factors will improve Loraine Leriche N/A or DELETE if not current risk factor) Outcome: Progressing   Problem: Ischemic Stroke/TIA Tissue Perfusion: Goal: Complications of ischemic stroke/TIA will be minimized Outcome: Progressing

## 2023-07-10 NOTE — Progress Notes (Signed)
PROGRESS NOTE    Eric Zamora  ZOX:[id number] DOB: [birth year] DOA: 06/15/2023 PCP: Uvaldo Bristle, PA-C    Chief Complaint  Patient presents with   Altered Mental Status    Brief Narrative:  Eric Zamora is a 77 y.o. 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 10/15 admitted to stepdown unit. 10/17: Overnight with fever, aspiration event, new AG acidosis, transferred back to SDU for BiPAP and insulin drip 10/18: taken to OR for drainage of abscess 10/23 cardiology was consulted for new onset A-fib with RVR. 10/24 nephrology was consulted for AKI and hyponatremia.  CT head was obtained for ongoing AMS and was found to have acute/subacute right MCA territory infarct.  Neurology was also consulted. 10/25.  Palliative care was consulted. 10/31.  Anticoagulation started again. 11/1.  Second dose of Bicillin given.  Underwent MBS. 11/2.  Marinol started for poor p.o. intake. 11/4.  Found to have ileus.  GI consulted.  Recommended DNR to legal guardian. 11/5.  Two-physician DNR form was signed and sent to guardian.  Will await decision.   Assessment & Plan:   Principal Problem:   Severe sepsis due to perirectal abscess(HCC) Active Problems:   Atrial fibrillation with rapid ventricular response (HCC)   Diabetic ketoacidosis without coma associated with type 2 diabetes mellitus (HCC)   Acute renal failure superimposed on stage 3b chronic kidney disease (HCC)   Dysphagia   CAD- Moderate LAD disease with moderate to severe ostial first diagonal disease by cath 07/2013 - not ammendable to PCI. 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 -Penrose drain placed and later removed.  No cultures were sent. -Patient noted to remain on IV vancomycin, Flagyl, Rocephin subsequently transitioned to cefepime and has been transition to Augmentin and doxycycline and recommending a total of 2 weeks of antibiotic treatment from initial I&D. -Antibiotic course completed. -Supportive care.  2.  Colonic ileus -Noted on x-ray 07/05/2023 and subsequently confirmed on CT scans. -Patient seen in consultation by GI who recommended conservative measures for now with repletion of electrolytes to keep potassium approximately 4, magnesium approximately 2. -Patient noted to have large watery bowel movement over the past 3 days.  -Patient with large watery bowel movement this morning in the rectal tube per RN..   -Patient with clinical improvement, abdomen less distended, softer, diffusely nontender to palpation with large watery stools. -Continue MiraLAX. -Continue Reglan 5 mg IV every 8 hours. -Full liquid diet. -IV fluids. -Place rectal tube. -Palliative care consulted and DNR recommended to legal guardian. -Frequent turns.  3.  New onset A-fib with RVR/HOCM -Patient during the  hospitalization to have new onset A-fib with RVR with CHADS2Vasc score of 4. -Patient seen in consultation by cardiology was on metoprolol 100 mg 4 times daily subsequently changed to Toprol-XL. -Patient placed  back on IV Lopressor due to colonic ileus. -Was on Eliquis for anticoagulation now on IV heparin due to ileus. -Likely resume Eliquis once ileus has resolved.  4.  Acute metabolic encephalopathy/acute right MCA stroke/history of dementia -Patient noted to have a progressive worsening mentation early on in the hospitalization. -CT head done 06/24/2023 with acute/subacute cortical and subcortical right MCA territory infarct. -MRI brain done consistent with acute infarct. -MRA brain done with no acute abnormalities. -Carotid Dopplers done with no significant ICA stenosis. -EEG done negative for seizures. -Repeat head CT done on 07/01/2023 per neurology recommendations showed no hemorrhagic conversion and as such neurology recommended resumption of anticoagulation with Eliquis. -Patient being followed by SLP was on dysphagia 1 diet however currently on clear liquids due to colonic ileus. -Advanced diet to full liquid diet. -PT/OT following and recommending SNF placement.  5.  History of seizures -EEG done negative for seizures/epileptiform activity. -Depakote on hold due to hepatic function. -Was on Keppra which is currently on hold.  6.  Moderate MR -Monitor volume status.  7.  Dysphagia -Secondary to acute CVA. -Being followed by SLP, underwent MBS was on a dysphagia 1 diet and currently on clear liquids due to colonic ileus.  -Advanced diet to full liquid diet. -Once ileus resolved will resume dysphagia 1 diet. -SLP following.  8.  Severe hypokalemia/hypocalcemia/hypophosphatemia -Potassium at 2.9 this morning, magnesium at 2.0. -Phosphorus at 1.4.. -Corrected calcium at approximately 8.0. -Change IV fluids to D5 normal saline with 40 mEq of KCl at 75 cc/h.   -KCl 10 mEq IV every hour x 6 runs. -Calcium gluconate 2 g IV x 1. -K-Phos 30 mmol IV x 1. -Repeat labs in the AM.  9.  Hypernatremia -Likely secondary to hypovolemic hypernatremia secondary to dehydration. -Improved  with hydration and change of IV fluids to D5W. -Was on D5W which was changed to half-normal saline, will change to normal saline.  Changed D5W to half-normal saline at 100 cc/h.  -Change IV fluids to D5 normal saline with 40 mEq of potassium at 75 cc an hour. -Repeat labs in the AM.  10.  Possible thrush/possible pill induced gastritis -Patient completed course of antibiotics. -Was on Diflucan through 07/08/2023. -Status post completion of nystatin swish and swallow.  11.  History of syphilis -RPR reactive for 2 years. -Dr. Allena Katz discussed with ID and patient currently being treated with Bicillin on a weekly basis.  With next dose 07/09/2023.  12.  AKI on CKD 3A -Baseline creatinine approximately 1.3 and noted to have worsening to 1.99. -Improved with hydration creatinine currently at 1.20. -Continue IV fluids.    13.  Non anion gap metabolic acidosis -Likely secondary to CKD. -Patient with an initial worsening acidosis and as such patient started on a bicarb drip which we will continue.   -Repeat labs in the AM.   14.  CAD -Patient not on DAPT or antiplatelets secondary to being on Eliquis. -Outpatient follow-up with cardiology.  15.  Bipolar disorder -Not on any medications prior to admission. -Stable.  16.  Poor oral intake -Likely multifactorial secondary to acute CVA and colonic ileus. -Was initially placed on Marinol which is currently on hold due to ileus. -Tolerating clear liquids, advance to full liquid diet.  17.  Goals of care conversation -Dr. Allena Katz discussed with  legal guardian on the phone. -Patient noted with poor prognosis due to large right MCA infarct with deficits and dysphagia with dysarthria. -Patient noted with poor oral intake and now with colonic ileus currently under conservative treatment. -It is felt with acute CVA patient likely will not return to his prior status prior to admission. -It is felt that the patient has a cardiac arrest prognosis  remains very poor to return to his prior status prior to arrest. -Patient with poor quality of life and Dr. Allena Katz recommended legal guardian to consider DNR for patient. -Palliative care following.     DVT prophylaxis: Heparin Code Status: DNR Family Communication: Updated patient.  No family at bedside. Disposition: SNF once ileus has resolved and clinically improved.  Status is: Inpatient Remains inpatient appropriate because: Severity of illness   Consultants:  General surgery: Dr. Derrell Lolling 06/18/2023 Gastroenterology: Dr. Ewing Schlein 07/05/2023 Cardiology: Dr. Royann Shivers 06/23/2023 Nephrology: Dr. Ronalee Belts 06/24/2023 Neurology: Dr.Bhagat 06/24/2023 Palliative care: Dr. Patterson Hammersmith 06/25/2023  Procedures:  CT abdomen and pelvis 07/05/2023 CT head 06/15/2023, 06/24/2023, 07/01/2023 Chest x-ray 06/15/2023, 06/17/2023 Modified barium swallow 06/19/2023 Abdominal films 07/05/2023, 07/06/2023 MRI brain 06/25/2023 MRA head 06/25/2023 Right upper quadrant ultrasound 06/22/2023 2D echo 06/16/2023 Carotid Dopplers 06/25/2023 CT chest abdomen and pelvis 06/17/2023 EEG 06/25/2023 Irrigation and debridement perirectal abscess per general surgery: Dr. Derrell Lolling 06/18/2023 PICC line placement 07/08/2023  Antimicrobials:  Anti-infectives (From admission, onward)    Start     Dose/Rate Route Frequency Ordered Stop   07/05/23 1600  fluconazole (DIFLUCAN) IVPB 200 mg        200 mg 100 mL/hr over 60 Minutes Intravenous Every 24 hours 07/05/23 1320 07/08/23 1658   07/02/23 1000  fluconazole (DIFLUCAN) tablet 100 mg  Status:  Discontinued        100 mg Oral Daily 07/02/23 0802 07/05/23 1320   07/02/23 0800  penicillin g benzathine (BICILLIN LA) 1200000 UNIT/2ML injection 2.4 Million Units        2.4 Million Units Intramuscular Weekly 07/01/23 1209 07/09/23 0950   06/26/23 1500  penicillin g benzathine (BICILLIN LA) 1200000 UNIT/2ML injection 2.4 Million Units        2.4 Million Units Intramuscular  Once  06/26/23 1351 06/28/23 0018   06/24/23 2200  amoxicillin-clavulanate (AUGMENTIN) 500-125 MG per tablet 1 tablet  Status:  Discontinued        1 tablet Oral Every 12 hours 06/24/23 1450 07/03/23 1202   06/24/23 2200  amoxicillin-clavulanate (AUGMENTIN) 500-125 MG per tablet 1 tablet  Status:  Discontinued        1 tablet Oral 2 times daily 06/24/23 1451 06/24/23 1451   06/21/23 1215  doxycycline (VIBRA-TABS) tablet 100 mg  Status:  Discontinued        100 mg Oral Every 12 hours 06/21/23 1128 07/03/23 1202   06/21/23 1215  amoxicillin-clavulanate (AUGMENTIN) 875-125 MG per tablet 1 tablet  Status:  Discontinued        1 tablet Oral Every 12 hours 06/21/23 1128 06/24/23 1450   06/20/23 1200  vancomycin (VANCOREADY) IVPB 1500 mg/300 mL  Status:  Discontinued        1,500 mg 150 mL/hr over 120 Minutes Intravenous Every 36 hours 06/20/23 0719 06/21/23 1128   06/19/23 0000  vancomycin (VANCOREADY) IVPB 1250 mg/250 mL  Status:  Discontinued        1,250 mg 166.7 mL/hr over 90 Minutes Intravenous Every 36 hours 06/18/23 0939 06/20/23 0719   06/18/23 1800  ceFEPIme (MAXIPIME) 2 g in sodium chloride 0.9 %  100 mL IVPB  Status:  Discontinued        2 g 200 mL/hr over 30 Minutes Intravenous Every 12 hours 06/18/23 0939 06/21/23 1128   06/18/23 0600  ceFEPIme (MAXIPIME) 2 g in sodium chloride 0.9 % 100 mL IVPB  Status:  Discontinued        2 g 200 mL/hr over 30 Minutes Intravenous Every 24 hours 06/17/23 0920 06/18/23 0939   06/17/23 1400  vancomycin (VANCOCIN) IVPB 1000 mg/200 mL premix  Status:  Discontinued        1,000 mg 200 mL/hr over 60 Minutes Intravenous Every 36 hours 06/17/23 0920 06/18/23 0939   06/16/23 1800  vancomycin (VANCOREADY) IVPB 750 mg/150 mL  Status:  Discontinued        750 mg 150 mL/hr over 60 Minutes Intravenous Every 24 hours 06/15/23 1639 06/17/23 0920   06/16/23 1100  cefTRIAXone (ROCEPHIN) 2 g in sodium chloride 0.9 % 100 mL IVPB  Status:  Discontinued        2 g 200 mL/hr  over 30 Minutes Intravenous Every 24 hours 06/15/23 1453 06/15/23 1455   06/15/23 1800  ceFEPIme (MAXIPIME) 2 g in sodium chloride 0.9 % 100 mL IVPB  Status:  Discontinued        2 g 200 mL/hr over 30 Minutes Intravenous Every 12 hours 06/15/23 1558 06/17/23 0920   06/15/23 1700  metroNIDAZOLE (FLAGYL) IVPB 500 mg  Status:  Discontinued        500 mg 100 mL/hr over 60 Minutes Intravenous Every 12 hours 06/15/23 1455 06/21/23 1128   06/15/23 1630  vancomycin (VANCOREADY) IVPB 1500 mg/300 mL        1,500 mg 150 mL/hr over 120 Minutes Intravenous  Once 06/15/23 1558 06/15/23 2058   06/15/23 1045  cefTRIAXone (ROCEPHIN) 2 g in sodium chloride 0.9 % 100 mL IVPB        2 g 200 mL/hr over 30 Minutes Intravenous  Once 06/15/23 1041 06/15/23 1155         Subjective: Sitting up in recliner.  Denies any chest pain or shortness of breath.  No abdominal pain.  Noted to be having loose stools and rectal tube.  8 about 10 to 15% of grits this morning per RN.    Objective: Vitals:   07/09/23 1227 07/09/23 2019 07/10/23 0028 07/10/23 0430  BP: 120/74 (!) 123/58 99/82 108/76  Pulse: (!) 108 63 84 (!) 108  Resp: 20 18 18 18   Temp: 98.6 F (37 C) 99 F (37.2 C) 98.3 F (36.8 C) 99.3 F (37.4 C)  TempSrc:  Oral Oral Oral  SpO2: 100% 100% 99% 93%  Weight:    81.1 kg  Height:        Intake/Output Summary (Last 24 hours) at 07/10/2023 1203 Last data filed at 07/10/2023 1050 Gross per 24 hour  Intake 2121.76 ml  Output 3050 ml  Net -928.24 ml   Filed Weights   07/08/23 0523 07/09/23 0600 07/10/23 0430  Weight: 81.8 kg 79 kg 81.1 kg    Examination:  General exam: NAD. Respiratory system: Lungs clear to auscultation bilaterally anterior lung fields.  No wheezes, no crackles, no rhonchi.  Fair air movement.  Cardiovascular system: RRR no murmurs rubs or gallops.  No JVD.  No pitting lower extremity edema.   Gastrointestinal system: Abdomen is obese, less distended, softer, positive bowel  sounds.  Nontender to palpation.  No rebound.  No guarding.  Central nervous system: Alert and oriented.  Dysarthric  speech.  Left facial weakness.  4/5 bilateral upper extremity strength.  4/5 bilateral lower extremity strength.   Extremities: Symmetric 5 x 5 power. Skin: No rashes, lesions or ulcers Psychiatry: Judgement and insight appear fair. Mood & affect appropriate.     Data Reviewed: I have personally reviewed following labs and imaging studies  CBC: Recent Labs  Lab 07/06/23 0518 07/07/23 0700 07/08/23 0548 07/09/23 0300 07/10/23 0322  WBC 7.5 7.6 7.3 7.1 6.3  HGB 13.5 12.7* 11.6* 10.2* 10.4*  HCT 42.8 41.4 37.3* 31.0* 31.7*  MCV 91.6 94.5 92.8 89.9 88.8  PLT 267 277 231 202 198    Basic Metabolic Panel: Recent Labs  Lab 07/06/23 0518 07/06/23 1452 07/07/23 0523 07/07/23 0700 07/08/23 0548 07/09/23 0300 07/09/23 1806 07/10/23 0322  NA 149*   < >  --  150* 143 138 136 134*  K 2.6*   < >  --  2.4* 2.7* 2.3* 2.6* 2.9*  CL 119*   < >  --  127* 119* 112* 106 103  CO2 17*   < >  --  15* 14* 20* 22 25  GLUCOSE 124*   < >  --  109* 121* 123* 179* 143*  BUN 32*   < >  --  25* 17 11 7* 6*  CREATININE 1.62*   < >  --  1.51* 1.48* 1.24 1.23 1.20  CALCIUM 8.9   < >  --  7.9* 7.6* 6.8* 7.2* 7.1*  MG 2.7*  --  2.3  --  2.4 1.9  --  2.0  PHOS  --   --   --   --   --   --   --  1.4*   < > = values in this interval not displayed.    GFR: Estimated Creatinine Clearance: 49.7 mL/min (by C-G formula based on SCr of 1.2 mg/dL).  Liver Function Tests: Recent Labs  Lab 07/08/23 0548 07/10/23 0322  AST 25  --   ALT 24  --   ALKPHOS 35*  --   BILITOT 0.6  --   PROT 6.3*  --   ALBUMIN 2.5* 2.3*    CBG: Recent Labs  Lab 07/10/23 0018 07/10/23 0426 07/10/23 0746 07/10/23 1125 07/10/23 1151  GLUCAP 114* 153* 150* 171* 161*     No results found for this or any previous visit (from the past 240 hour(s)).       Radiology Studies: DG Abd Portable  1V  Result Date: 07/09/2023 CLINICAL DATA:  Follow-up ileus. EXAM: PORTABLE ABDOMEN - 1 VIEW COMPARISON:  07/08/23 FINDINGS: Again seen is diffuse colonic dilatation compatible with an ileus. The degree of bowel distension is similar to slightly improved in the interval. No signs of pneumoperitoneum or pneumatosis. IMPRESSION: Persistent ileus pattern. The degree of bowel distension is similar to slightly improved in the interval. Electronically Signed   By: Signa Kell M.D.   On: 07/09/2023 12:47        Scheduled Meds:  Chlorhexidine Gluconate Cloth  6 each Topical Daily   insulin aspart  0-15 Units Subcutaneous Q4H   insulin glargine-yfgn  8 Units Subcutaneous QHS   magic mouthwash  10 mL Oral QID   metoCLOPramide (REGLAN) injection  5 mg Intravenous Q8H   metoprolol tartrate  5 mg Intravenous Q6H   mouth rinse  15 mL Mouth Rinse 4 times per day   pantoprazole (PROTONIX) IV  40 mg Intravenous Q24H   polyethylene glycol  17 g Oral BID  simethicone  40 mg Oral QID   sodium chloride flush  3 mL Intravenous Q12H   Continuous Infusions:  dextrose 5 % and 0.9 % NaCl with KCl 40 mEq/L 75 mL/hr at 07/10/23 0945   heparin 900 Units/hr (07/09/23 1625)   potassium chloride 10 mEq (07/10/23 1159)   potassium PHOSPHATE IVPB (in mmol)     sodium bicarbonate 150 mEq in dextrose 5 % 1,150 mL infusion 125 mL/hr at 07/09/23 1625     LOS: 25 days    Time spent: 40 minutes    Ramiro Harvest, MD [facility]   To contact the attending provider between 7A-7P or the covering provider during after hours 7P-7A, please log into the web site [email or web address] and access using universal [place] password for that web site. If you do not have the password, please call the hospital operator.  07/10/2023, 12:03 PM

## 2023-07-10 NOTE — Progress Notes (Signed)
Physical Therapy Treatment Patient Details Name: Eric Zamora MRN: [id number] DOB: [birth year] Today's Date: 07/10/2023   History of Present Illness Pt is 77 yo admitted with severe sepsis due to perirectal abscess.  Pt to OR on 10/18 for drainage of abscess.  Pt with increased lethargy on 10/24 , MRI Head performed and pt found to have acute infarct  right frontal operculum with moderate associated cytotoxic edema and petechial hemorrhage. Pt now found to have ileus 07/06/23.PMH: bipolar disorder, dementia, history of NSTEMI, hypertrophic cardiomyopathy    PT Comments  Pt required max assist of 2 for supine to sit. Poor sitting balance with posterior lean requiring min/mod assist. Sit to stand with max assist of 2 using Stedy. Transfer to recliner with Stedy. Pt performed seated BUE/LE exercises in recliner. Speech is quite slurred and difficult to understand, pt is able to answer yes/no questions consistently.     If plan is discharge home, recommend the following: Two people to help with walking and/or transfers;Two people to help with bathing/dressing/bathroom;Assistance with cooking/housework;Direct supervision/assist for financial management;Assist for transportation;Help with stairs or ramp for entrance   Can travel by private vehicle     No  Equipment Recommendations  None recommended by PT    Recommendations for Other Services       Precautions / Restrictions Precautions Precautions: Fall Restrictions Weight Bearing Restrictions: No     Mobility  Bed Mobility Overal bed mobility: Needs Assistance Bed Mobility: Supine to Sit     Supine to sit: Max assist, +2 for physical assistance, HOB elevated     General bed mobility comments: assist to elevate trunk, pivot hips, support BLEs    Transfers Overall transfer level: Needs assistance     Sit to Stand: Max assist, +2 physical assistance           General transfer comment: STS with Antony Salmon, Max A to power  up Transfer via Lift Equipment: Stedy  Ambulation/Gait                   Stairs             Wheelchair Mobility     Tilt Bed    Modified Rankin (Stroke Patients Only)       Balance   Sitting-balance support: No upper extremity supported, Feet supported Sitting balance-Leahy Scale: Poor Sitting balance - Comments: posterior lean requiring min/mod assist, able to lean forward to reach bar on Stedy                                    Cognition Arousal: Alert Behavior During Therapy: [facility] for tasks assessed/performed Overall Cognitive Status: No family/caregiver present to determine baseline cognitive functioning                                 General Comments: Alert, able to answer with yes/no responses to simple questions, speech is slurred and difficult to understand, follows 1 step commands with occasional repetition, cooperative        Exercises General Exercises - Lower Extremity Ankle Circles/Pumps: AROM, 10 reps, Supine, Both Heel Slides: AAROM, Both, 10 reps, Supine Hip ABduction/ADduction: AROM, Both, Supine, 10 reps Shoulder flexion AROM both 10 seated   General Comments        Pertinent Vitals/Pain Pain Assessment Faces Pain Scale: No hurt Breathing: normal Negative Vocalization: none  Facial Expression: smiling or inexpressive Body Language: relaxed Consolability: no need to console PAINAD Score: 0    Home Living                          Prior Function            PT Goals (current goals can now be found in the care plan section) Acute Rehab PT Goals Patient Stated Goal: none stated PT Goal Formulation: Patient unable to participate in goal setting Time For Goal Achievement: 07/24/23 Potential to Achieve Goals: Fair Progress towards PT goals: Progressing toward goals    Frequency    Min 1X/week      PT Plan      Co-evaluation              AM-PAC PT "6 Clicks" Mobility    Outcome Measure  Help needed turning from your back to your side while in a flat bed without using bedrails?: A Lot Help needed moving from lying on your back to sitting on the side of a flat bed without using bedrails?: Total Help needed moving to and from a bed to a chair (including a wheelchair)?: Total Help needed standing up from a chair using your arms (e.g., wheelchair or bedside chair)?: Total Help needed to walk in hospital room?: Total Help needed climbing 3-5 steps with a railing? : Total 6 Click Score: 7    End of Session   Activity Tolerance: Patient tolerated treatment well Patient left: with call bell/phone within reach;with chair alarm set;in chair;with nursing/sitter in room Nurse Communication: Mobility status;Need for lift equipment PT Visit Diagnosis: Other abnormalities of gait and mobility (R26.89);Muscle weakness (generalized) (M62.81)     Time: 4098-1191 PT Time Calculation (min) (ACUTE ONLY): 20 min  Charges:    $Therapeutic Activity: 8-22 mins PT General Charges $$ ACUTE PT VISIT: 1 Visit                     Tamala Ser PT 07/10/2023  Acute Rehabilitation Services  Office [phone]

## 2023-07-10 NOTE — Progress Notes (Signed)
PHARMACY - ANTICOAGULATION CONSULT NOTE  Pharmacy Consult for IV heparin Indication: atrial fibrillation  No Known Allergies  Patient Measurements: Height: 5\' 5"  (165.1 cm) Weight: 81.1 kg (178 lb 12.7 oz) IBW/kg (Calculated) : 61.5 Heparin Dosing Weight: 80.7 kg   Vital Signs: Temp: 99.3 F (37.4 C) (11/09 0430) Temp Source: Oral (11/09 0430) BP: 108/76 (11/09 0430) Pulse Rate: 108 (11/09 0430)  Labs: Recent Labs    07/08/23 0548 07/08/23 0933 07/08/23 2140 07/09/23 0300 07/09/23 1014 07/09/23 1806 07/10/23 0322  HGB 11.6*  --   --  10.2*  --   --  10.4*  HCT 37.3*  --   --  31.0*  --   --  31.7*  PLT 231  --   --  202  --   --  198  APTT 184* 188* 141*  --  78*  --   --   HEPARINUNFRC >1.10*  --   --   --  0.47 0.40 0.31  CREATININE 1.48*  --   --  1.24  --  1.23 1.20    Estimated Creatinine Clearance: 49.7 mL/min (by C-G formula based on SCr of 1.2 mg/dL).   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   06/15/2023 at am   ergocalciferol (VITAMIN D2) 1.25 MG (50000 UT) capsule Take 50,000 Units by mouth every 30 (thirty) days. On the 7 th of every month.   06/07/2023 at am   furosemide (LASIX) 40 MG tablet Take 1 tablet (40 mg total) by mouth daily. 30 tablet  06/15/2023 at am   latanoprost (XALATAN) 0.005 % ophthalmic solution Place 1 drop into both eyes at bedtime.   06/15/2023 at pm   levETIRAcetam (KEPPRA) 500 MG tablet Take 500 mg by mouth 2 (two) times daily.   06/15/2023 at pm   metoprolol tartrate (LOPRESSOR) 25 MG tablet Take 1 tablet (25 mg total)  by mouth 2 (two) times daily.   06/15/2023 at pm   PARoxetine (PAXIL) 10 MG tablet Take 10 mg by mouth daily.   06/15/2023 at am   potassium chloride SA (K-DUR,KLOR-CON) 20 MEQ tablet Take 2 tablets (40 mEq total) by mouth daily.   06/15/2023 at am   simvastatin (ZOCOR) 20 MG tablet Take 20 mg by mouth at bedtime.   06/15/2023 at pm    Assessment: Pharmacy is consulted to start hepairn drip on 77 yo male diagnosed with atrial fibrillation. Pt has been changed to heparin drip. Last dose of apixaban at 0904 on 11/4.   Today, 07/10/23  03:22 heparin level therapeutic at 0.31 with heparin infusing at 900 units/hr CBC: Hgb up slightly 10.4, pltc WNL at 198--stable and pharmacy will continue to monitor  No bleeding or infusion related complications reported  Goal of Therapy:  Heparin level 0.3-0.7 units/ml aPTT 66-102 sec  Monitor platelets by anticoagulation protocol: Yes   Plan:  Continue heparin rate of 900 units/hr Daily heparin level & CBC  Monitor for signs and symptoms of bleeding   Thank you for allowing pharmacy  to be a part of this patient's care.  Selinda Eon, PharmD, BCPS Clinical Pharmacist [place] 07/10/2023 10:26 AM

## 2023-07-11 DIAGNOSIS — A419 Sepsis, unspecified organism: Secondary | ICD-10-CM | POA: Diagnosis not present

## 2023-07-11 DIAGNOSIS — R1310 Dysphagia, unspecified: Secondary | ICD-10-CM | POA: Diagnosis not present

## 2023-07-11 DIAGNOSIS — R4182 Altered mental status, unspecified: Secondary | ICD-10-CM | POA: Diagnosis not present

## 2023-07-11 DIAGNOSIS — I4891 Unspecified atrial fibrillation: Secondary | ICD-10-CM | POA: Diagnosis not present

## 2023-07-11 LAB — RENAL FUNCTION PANEL
Albumin: 2.4 g/dL — ABNORMAL LOW (ref 3.5–5.0)
Anion gap: 8 (ref 5–15)
BUN: 7 mg/dL — ABNORMAL LOW (ref 8–23)
CO2: 24 mmol/L (ref 22–32)
Calcium: 7.5 mg/dL — ABNORMAL LOW (ref 8.9–10.3)
Chloride: 103 mmol/L (ref 98–111)
Creatinine, Ser: 1.18 mg/dL (ref 0.61–1.24)
GFR, Estimated: >60 mL/min (ref >60)
Glucose, Bld: 99 mg/dL (ref 70–99)
Phosphorus: 2.1 mg/dL — ABNORMAL LOW (ref 2.5–4.6)
Potassium: 2.3 mmol/L — CL (ref 3.5–5.1)
Sodium: 135 mmol/L (ref 135–145)

## 2023-07-11 LAB — HEPARIN LEVEL (UNFRACTIONATED)
Heparin Unfractionated: 0.19 [IU]/mL — ABNORMAL LOW (ref 0.30–0.70)
Heparin Unfractionated: 0.37 [IU]/mL (ref 0.30–0.70)

## 2023-07-11 LAB — CBC
HCT: 31.1 % — ABNORMAL LOW (ref 39.0–52.0)
Hemoglobin: 10.3 g/dL — ABNORMAL LOW (ref 13.0–17.0)
MCH: 29.2 pg (ref 26.0–34.0)
MCHC: 33.1 g/dL (ref 30.0–36.0)
MCV: 88.1 fL (ref 80.0–100.0)
Platelets: 190 10*3/uL (ref 150–400)
RBC: 3.53 MIL/uL — ABNORMAL LOW (ref 4.22–5.81)
RDW: 15 % (ref 11.5–15.5)
WBC: 7.2 10*3/uL (ref 4.0–10.5)
nRBC: 0 % (ref 0.0–0.2)

## 2023-07-11 LAB — GLUCOSE, CAPILLARY
Glucose-Capillary: 107 mg/dL — ABNORMAL HIGH (ref 70–99)
Glucose-Capillary: 123 mg/dL — ABNORMAL HIGH (ref 70–99)
Glucose-Capillary: 139 mg/dL — ABNORMAL HIGH (ref 70–99)
Glucose-Capillary: 156 mg/dL — ABNORMAL HIGH (ref 70–99)
Glucose-Capillary: 95 mg/dL (ref 70–99)

## 2023-07-11 MED ORDER — POTASSIUM CHLORIDE 10 MEQ/100ML IV SOLN: 10.0000 meq | INTRAVENOUS | Status: DC

## 2023-07-11 MED ORDER — KCL IN DEXTROSE-NACL 40-5-0.9 MEQ/L-%-% IV SOLN
INTRAVENOUS | Status: DC
  Filled 2023-07-11 (×2): qty 1000

## 2023-07-11 MED ORDER — CALCIUM GLUCONATE-NACL 2-0.675 GM/100ML-% IV SOLN
2.0000 g | Freq: Once | INTRAVENOUS | Status: AC
  Administered 2023-07-11: 2000 mg via INTRAVENOUS
  Filled 2023-07-11: qty 100

## 2023-07-11 MED ORDER — POLYETHYLENE GLYCOL 3350 17 G PO PACK
17.0000 g | PACK | Freq: Every day | ORAL | Status: DC
Start: 2023-07-11 — End: 2023-07-17
  Administered 2023-07-11 – 2023-07-16 (×5): 17 g via ORAL
  Filled 2023-07-11 (×5): qty 1

## 2023-07-11 MED ORDER — POTASSIUM PHOSPHATES 15 MMOLE/5ML IV SOLN
30.0000 mmol | Freq: Once | INTRAVENOUS | Status: AC
  Administered 2023-07-11: 30 mmol via INTRAVENOUS
  Filled 2023-07-11: qty 10

## 2023-07-11 MED ORDER — HEPARIN BOLUS VIA INFUSION
2000.0000 [IU] | Freq: Once | INTRAVENOUS | Status: AC
  Administered 2023-07-11: 2000 [IU] via INTRAVENOUS
  Filled 2023-07-11: qty 2000

## 2023-07-11 MED ORDER — POTASSIUM CHLORIDE 10 MEQ/100ML IV SOLN
10.0000 meq | INTRAVENOUS | Status: AC
  Administered 2023-07-11 (×6): 10 meq via INTRAVENOUS
  Filled 2023-07-11 (×6): qty 100

## 2023-07-11 NOTE — Plan of Care (Signed)
No acute events this shift. The patient has rested comfortably throughout the shift. He has had no complaints of pain. He continues to have large amounts of loose and watery stools and the flexiseal remains in place. He has been turned throughout the shift. VSS. Fall precautions in place. Will continue to monitor.  Problem: Education: Goal: Ability to describe self-care measures that may prevent or decrease complications (Diabetes Survival Skills Education) will improve Outcome: Progressing Goal: Individualized Educational Video(s) Outcome: Progressing   Problem: Coping: Goal: Ability to adjust to condition or change in health will improve Outcome: Progressing   Problem: Fluid Volume: Goal: Ability to maintain a balanced intake and output will improve Outcome: Progressing   Problem: Health Behavior/Discharge Planning: Goal: Ability to identify and utilize available resources and services will improve Outcome: Progressing Goal: Ability to manage health-related needs will improve Outcome: Progressing   Problem: Metabolic: Goal: Ability to maintain appropriate glucose levels will improve Outcome: Progressing   Problem: Nutritional: Goal: Maintenance of adequate nutrition will improve Outcome: Progressing Goal: Progress toward achieving an optimal weight will improve Outcome: Progressing   Problem: Skin Integrity: Goal: Risk for impaired skin integrity will decrease Outcome: Progressing   Problem: Tissue Perfusion: Goal: Adequacy of tissue perfusion will improve Outcome: Progressing   Problem: Education: Goal: Knowledge of General Education information will improve Description: Including pain rating scale, medication(s)/side effects and non-pharmacologic comfort measures Outcome: Progressing   Problem: Health Behavior/Discharge Planning: Goal: Ability to manage health-related needs will improve Outcome: Progressing   Problem: Clinical Measurements: Goal: Ability to  maintain clinical measurements within normal limits will improve Outcome: Progressing Goal: Will remain free from infection Outcome: Progressing Goal: Diagnostic test results will improve Outcome: Progressing Goal: Respiratory complications will improve Outcome: Progressing Goal: Cardiovascular complication will be avoided Outcome: Progressing   Problem: Activity: Goal: Risk for activity intolerance will decrease Outcome: Progressing   Problem: Nutrition: Goal: Adequate nutrition will be maintained Outcome: Progressing   Problem: Coping: Goal: Level of anxiety will decrease Outcome: Progressing   Problem: Elimination: Goal: Will not experience complications related to bowel motility Outcome: Progressing Goal: Will not experience complications related to urinary retention Outcome: Progressing   Problem: Pain Managment: Goal: General experience of comfort will improve Outcome: Progressing   Problem: Safety: Goal: Ability to remain free from injury will improve Outcome: Progressing   Problem: Skin Integrity: Goal: Risk for impaired skin integrity will decrease Outcome: Progressing   Problem: Fluid Volume: Goal: Hemodynamic stability will improve Outcome: Progressing   Problem: Clinical Measurements: Goal: Diagnostic test results will improve Outcome: Progressing Goal: Signs and symptoms of infection will decrease Outcome: Progressing   Problem: Respiratory: Goal: Ability to maintain adequate ventilation will improve Outcome: Progressing   Problem: Education: Goal: Ability to describe self-care measures that may prevent or decrease complications (Diabetes Survival Skills Education) will improve Outcome: Progressing Goal: Individualized Educational Video(s) Outcome: Progressing   Problem: Cardiac: Goal: Ability to maintain an adequate cardiac output will improve Outcome: Progressing   Problem: Health Behavior/Discharge Planning: Goal: Ability to identify  and utilize available resources and services will improve Outcome: Progressing Goal: Ability to manage health-related needs will improve Outcome: Progressing   Problem: Fluid Volume: Goal: Ability to achieve a balanced intake and output will improve Outcome: Progressing   Problem: Metabolic: Goal: Ability to maintain appropriate glucose levels will improve Outcome: Progressing   Problem: Nutritional: Goal: Maintenance of adequate nutrition will improve Outcome: Progressing Goal: Maintenance of adequate weight for body size and type will improve Outcome: Progressing  Problem: Respiratory: Goal: Will regain and/or maintain adequate ventilation Outcome: Progressing   Problem: Urinary Elimination: Goal: Ability to achieve and maintain adequate renal perfusion and functioning will improve Outcome: Progressing   Problem: Education: Goal: Knowledge of disease or condition will improve Outcome: Progressing Goal: Knowledge of secondary prevention will improve (MUST DOCUMENT ALL) Outcome: Progressing Goal: Knowledge of patient specific risk factors will improve Loraine Leriche N/A or DELETE if not current risk factor) Outcome: Progressing   Problem: Ischemic Stroke/TIA Tissue Perfusion: Goal: Complications of ischemic stroke/TIA will be minimized Outcome: Progressing   Problem: Coping: Goal: Will verbalize positive feelings about self Outcome: Progressing Goal: Will identify appropriate support needs Outcome: Progressing   Problem: Health Behavior/Discharge Planning: Goal: Ability to manage health-related needs will improve Outcome: Progressing Goal: Goals will be collaboratively established with patient/family Outcome: Progressing   Problem: Self-Care: Goal: Ability to participate in self-care as condition permits will improve Outcome: Progressing Goal: Verbalization of feelings and concerns over difficulty with self-care will improve Outcome: Progressing Goal: Ability to  communicate needs accurately will improve Outcome: Progressing   Problem: Nutrition: Goal: Risk of aspiration will decrease Outcome: Progressing Goal: Dietary intake will improve Outcome: Progressing

## 2023-07-11 NOTE — Plan of Care (Signed)
Problem: Education: Goal: Ability to describe self-care measures that may prevent or decrease complications (Diabetes Survival Skills Education) will improve Outcome: Progressing Goal: Individualized Educational Video(s) Outcome: Progressing   Problem: Coping: Goal: Ability to adjust to condition or change in health will improve Outcome: Progressing   Problem: Fluid Volume: Goal: Ability to maintain a balanced intake and output will improve Outcome: Progressing   Problem: Health Behavior/Discharge Planning: Goal: Ability to identify and utilize available resources and services will improve Outcome: Progressing Goal: Ability to manage health-related needs will improve Outcome: Progressing   Problem: Metabolic: Goal: Ability to maintain appropriate glucose levels will improve Outcome: Progressing   Problem: Nutritional: Goal: Maintenance of adequate nutrition will improve Outcome: Progressing Goal: Progress toward achieving an optimal weight will improve Outcome: Progressing   Problem: Skin Integrity: Goal: Risk for impaired skin integrity will decrease Outcome: Progressing   Problem: Tissue Perfusion: Goal: Adequacy of tissue perfusion will improve Outcome: Progressing   Problem: Education: Goal: Knowledge of General Education information will improve Description: Including pain rating scale, medication(s)/side effects and non-pharmacologic comfort measures Outcome: Progressing   Problem: Health Behavior/Discharge Planning: Goal: Ability to manage health-related needs will improve Outcome: Progressing   Problem: Clinical Measurements: Goal: Ability to maintain clinical measurements within normal limits will improve Outcome: Progressing Goal: Will remain free from infection Outcome: Progressing Goal: Diagnostic test results will improve Outcome: Progressing Goal: Respiratory complications will improve Outcome: Progressing Goal: Cardiovascular complication will  be avoided Outcome: Progressing   Problem: Activity: Goal: Risk for activity intolerance will decrease Outcome: Progressing   Problem: Nutrition: Goal: Adequate nutrition will be maintained Outcome: Progressing   Problem: Coping: Goal: Level of anxiety will decrease Outcome: Progressing   Problem: Elimination: Goal: Will not experience complications related to bowel motility Outcome: Progressing Goal: Will not experience complications related to urinary retention Outcome: Progressing   Problem: Pain Managment: Goal: General experience of comfort will improve Outcome: Progressing   Problem: Safety: Goal: Ability to remain free from injury will improve Outcome: Progressing   Problem: Skin Integrity: Goal: Risk for impaired skin integrity will decrease Outcome: Progressing   Problem: Fluid Volume: Goal: Hemodynamic stability will improve Outcome: Progressing   Problem: Clinical Measurements: Goal: Diagnostic test results will improve Outcome: Progressing Goal: Signs and symptoms of infection will decrease Outcome: Progressing   Problem: Respiratory: Goal: Ability to maintain adequate ventilation will improve Outcome: Progressing   Problem: Education: Goal: Ability to describe self-care measures that may prevent or decrease complications (Diabetes Survival Skills Education) will improve Outcome: Progressing Goal: Individualized Educational Video(s) Outcome: Progressing   Problem: Cardiac: Goal: Ability to maintain an adequate cardiac output will improve Outcome: Progressing   Problem: Health Behavior/Discharge Planning: Goal: Ability to identify and utilize available resources and services will improve Outcome: Progressing Goal: Ability to manage health-related needs will improve Outcome: Progressing   Problem: Fluid Volume: Goal: Ability to achieve a balanced intake and output will improve Outcome: Progressing   Problem: Metabolic: Goal: Ability to  maintain appropriate glucose levels will improve Outcome: Progressing   Problem: Nutritional: Goal: Maintenance of adequate nutrition will improve Outcome: Progressing Goal: Maintenance of adequate weight for body size and type will improve Outcome: Progressing   Problem: Respiratory: Goal: Will regain and/or maintain adequate ventilation Outcome: Progressing   Problem: Urinary Elimination: Goal: Ability to achieve and maintain adequate renal perfusion and functioning will improve Outcome: Progressing   Problem: Education: Goal: Knowledge of disease or condition will improve Outcome: Progressing Goal: Knowledge of secondary prevention will improve (  MUST DOCUMENT ALL) Outcome: Progressing Goal: Knowledge of patient specific risk factors will improve Loraine Leriche N/A or DELETE if not current risk factor) Outcome: Progressing   Problem: Ischemic Stroke/TIA Tissue Perfusion: Goal: Complications of ischemic stroke/TIA will be minimized Outcome: Progressing

## 2023-07-11 NOTE — Progress Notes (Signed)
PHARMACY - ANTICOAGULATION CONSULT NOTE  Pharmacy Consult for IV heparin Indication: atrial fibrillation  No Known Allergies  Patient Measurements: Height: 5\' 5"  (165.1 cm) Weight: 81.1 kg (178 lb 12.7 oz) IBW/kg (Calculated) : 61.5 Heparin Dosing Weight: 80.7 kg   Vital Signs: Temp: 99.3 F (37.4 C) (11/10 0731) Temp Source: Oral (11/10 0731) BP: 151/64 (11/10 1142) Pulse Rate: 78 (11/10 1142)  Labs: Recent Labs     0000 07/08/23 2140 07/09/23 0300 07/09/23 1014 07/09/23 1014 07/09/23 1806 07/10/23 0322 07/11/23 0219 07/11/23 1220  HGB   < >  --  10.2*  --   --   --  10.4* 10.3*  --   HCT  --   --  31.0*  --   --   --  31.7* 31.1*  --   PLT  --   --  202  --   --   --  198 190  --   APTT  --  141*  --  78*  --   --   --   --   --   HEPARINUNFRC  --   --   --  0.47   < > 0.40 0.31 0.19* 0.37  CREATININE   < >  --  1.24  --   --  1.23 1.20 1.18  --    < > = values in this interval not displayed.    Estimated Creatinine Clearance: 50.6 mL/min (by C-G formula based on SCr of 1.18 mg/dL).   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   06/15/2023 at am   ergocalciferol (VITAMIN D2) 1.25 MG (50000 UT) capsule Take 50,000 Units by mouth every 30 (thirty) days. On the 7 th of every month.   06/07/2023 at am   furosemide (LASIX) 40 MG tablet Take 1 tablet (40 mg total) by mouth daily. 30 tablet  06/15/2023 at am   latanoprost (XALATAN) 0.005 % ophthalmic solution Place 1 drop into both eyes at bedtime.   06/15/2023 at pm   levETIRAcetam (KEPPRA) 500 MG tablet Take  500 mg by mouth 2 (two) times daily.   06/15/2023 at pm   metoprolol tartrate (LOPRESSOR) 25 MG tablet Take 1 tablet (25 mg total) by mouth 2 (two) times daily.   06/15/2023 at pm   PARoxetine (PAXIL) 10 MG tablet Take 10 mg by mouth daily.   06/15/2023 at am   potassium chloride SA (K-DUR,KLOR-CON) 20 MEQ tablet Take 2 tablets (40 mEq total) by mouth daily.   06/15/2023 at am   simvastatin (ZOCOR) 20 MG tablet Take 20 mg by mouth at bedtime.   06/15/2023 at pm    Assessment: Pharmacy is consulted to start hepairn drip on 77 yo male diagnosed with atrial fibrillation. Pt has been changed to heparin drip. Last dose of apixaban at 0904 on 11/4.   Today, 07/11/23  Heparin level therapeutic at 0.37 with heparin infusing at 1050 units/hr CBC: Hgb low/stable, pltc WNL  No bleeding or infusion related complications reported by RN Scr 1.2-  improved to baseline  Goal of Therapy:  Heparin level 0.3-0.7 units/ml Monitor platelets by anticoagulation protocol: Yes   Plan:  Continue IV heparin at 1050 units/hr Daily heparin level & CBC  Monitor for signs and symptoms of bleeding F/U ability to resume apixaban   Thank you for allowing pharmacy to be a part of this patient's care.  Selinda Eon, PharmD, BCPS Clinical Pharmacist [place] 07/11/2023 1:12 PM

## 2023-07-11 NOTE — Progress Notes (Signed)
PROGRESS NOTE    Eric Zamora  XLK:[id number] DOB: February 08, 1946 DOA: 06/15/2023 PCP: Uvaldo Bristle, PA-C    Chief Complaint  Patient presents with   Altered Mental Status    Brief Narrative:  Eric Zamora is a 77 y.o. 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 10/15 admitted to stepdown unit. 10/17: Overnight with fever, aspiration event, new AG acidosis, transferred back to SDU for BiPAP and insulin drip 10/18: taken to OR for drainage of abscess 10/23 cardiology was consulted for new onset A-fib with RVR. 10/24 nephrology was consulted for AKI and hyponatremia.  CT head was obtained for ongoing AMS and was found to have acute/subacute right MCA territory infarct.  Neurology was also consulted. 10/25.  Palliative care was consulted. 10/31.  Anticoagulation started again. 11/1.  Second dose of Bicillin given.  Underwent MBS. 11/2.  Marinol started for poor p.o. intake. 11/4.  Found to have ileus.  GI consulted.  Recommended DNR to legal guardian. 11/5.  Two-physician DNR form was signed and sent to guardian.  Will await decision.   Assessment & Plan:   Principal Problem:   Severe sepsis due to perirectal abscess(HCC) Active Problems:   Atrial fibrillation with rapid ventricular response (HCC)   Diabetic ketoacidosis without coma associated with type 2 diabetes mellitus (HCC)   Acute renal failure superimposed on stage 3b chronic kidney disease (HCC)   Dysphagia   CAD- Moderate LAD disease with moderate to severe ostial first diagonal disease by cath 07/2013 - not ammendable to PCI. 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 -Penrose drain placed and later removed.  No cultures were sent. -Patient noted to remain on IV vancomycin, Flagyl, Rocephin subsequently transitioned to cefepime and has been transition to Augmentin and doxycycline and recommending a total of 2 weeks of antibiotic treatment from initial I&D. -Antibiotic course completed. -Supportive care.  2.  Colonic ileus -Noted on x-ray 07/05/2023 and subsequently confirmed on CT scans. -Patient seen in consultation by GI who recommended conservative measures for now with repletion of electrolytes to keep potassium approximately 4, magnesium approximately 2. -Patient noted to have large watery bowel movement over the past 3 days.  -Patient with large watery bowel movement this morning in the rectal tube per RN..   -Patient with clinical improvement, abdomen less distended, softer, diffusely nontender to palpation with large watery stools. -Decrease MiraLAX to daily.  -Continue Reglan 5 mg IV every 8 hours. -Full liquid diet. -IV fluids. -Rectal tube in place. -Palliative care consulted and DNR recommended to legal guardian. -Frequent turns.  3.  New onset A-fib with  RVR/HOCM -Patient during the hospitalization to have new onset A-fib with RVR with CHADS2Vasc score of 4. -Patient seen in consultation by cardiology was on metoprolol 100 mg 4 times daily subsequently changed to  Toprol-XL. -Patient placed back on IV Lopressor due to colonic ileus. -Was on Eliquis for anticoagulation now on IV heparin due to ileus. -Likely resume Eliquis once ileus has resolved.  4.  Acute metabolic encephalopathy/acute right MCA stroke/history of dementia -Patient noted to have a progressive worsening mentation early on in the hospitalization. -CT head done 06/24/2023 with acute/subacute cortical and subcortical right MCA territory infarct. -MRI brain done consistent with acute infarct. -MRA brain done with no acute abnormalities. -Carotid Dopplers done with no significant ICA stenosis. -EEG done negative for seizures. -Repeat head CT done on 07/01/2023 per neurology recommendations showed no hemorrhagic conversion and as such neurology recommended resumption of anticoagulation with Eliquis. -Patient being followed by SLP was on dysphagia 1 diet however currently on full liquids due to colonic ileus. -PT/OT following and recommending SNF placement.  5.  History of seizures -EEG done negative for seizures/epileptiform activity. -Depakote on hold due to hepatic function. -Was on Keppra which is currently on hold.  6.  Moderate MR -Monitor volume status.  7.  Dysphagia -Secondary to acute CVA. -Being followed by SLP, underwent MBS was on a dysphagia 1 diet and subsequently downgraded to clear liquids due to colonic ileus and now has been advanced to a full liquid diet.   -Once ileus resolved will resume dysphagia 1 diet. -SLP following.  8.  Severe hypokalemia/hypocalcemia/hypophosphatemia -Potassium at 2.3 this morning, magnesium at 2.0. -Phosphorus at 2.1. -Corrected calcium at approximately 8.78. -Continue D5 normal saline with 40 mEq of KCl at 75 cc/h.   -KCl 10 mEq IV every hour x 6 runs. -Calcium gluconate 2 g IV x 1. -K-Phos 30 mmol IV x 1. -Repeat labs in the AM.  9.  Hypernatremia -Likely secondary to hypovolemic hypernatremia secondary to  dehydration. -Improved with hydration and change of IV fluids to D5W. -Was on D5W which was changed to half-normal saline, and subsequently changed to normal saline.  -Continue D5 normal saline with 40 mEq of potassium at 75 cc an hour. -Repeat labs in the AM.  10.  Possible thrush/possible pill induced gastritis -Patient completed course of antibiotics. -Was on Diflucan through 07/08/2023. -Status post completion of nystatin swish and swallow.  11.  History of syphilis -RPR reactive for 2 years. -Dr. Allena Katz discussed with ID and patient currently being treated with Bicillin on a weekly basis.  With next dose 07/09/2023.  12.  AKI on CKD 3A -Baseline creatinine approximately 1.3 and noted to have worsening to 1.99. -Improved with hydration creatinine currently at 1.18. -Continue IV fluids.    13.  Non anion gap metabolic acidosis -Likely secondary to CKD. -Patient with an initial worsening acidosis and as such patient started on a bicarb drip. -Discontinue bicarb drip. -Repeat labs in the AM.   14.  CAD -Patient not on DAPT or antiplatelets secondary to being on Eliquis. -Outpatient follow-up with cardiology.  15.  Bipolar disorder -Not on any medications prior to admission. -Stable.  16.  Poor oral intake -Likely multifactorial secondary to acute CVA and colonic ileus. -Was initially placed on Marinol which is currently on hold due to ileus. -Tolerating full liquid diet.  17.  Goals of care conversation -Dr. Allena Katz discussed with legal guardian on the phone. -Patient noted with poor prognosis due to large right MCA infarct with  deficits and dysphagia with dysarthria. -Patient noted with poor oral intake and now with colonic ileus currently under conservative treatment. -It is felt with acute CVA patient likely will not return to his prior status prior to admission. -It is felt that the patient has a cardiac arrest prognosis remains very poor to return to his prior status  prior to arrest. -Patient with poor quality of life and Dr. Allena Katz recommended legal guardian to consider DNR for patient. -Palliative care following.     DVT prophylaxis: Heparin Code Status: DNR Family Communication: Updated patient.  No family at bedside. Disposition: SNF once ileus has resolved and clinically improved and tolerating oral diet and repletion of electrolytes.  Status is: Inpatient Remains inpatient appropriate because: Severity of illness   Consultants:  General surgery: Dr. Derrell Lolling 06/18/2023 Gastroenterology: Dr. Ewing Schlein 07/05/2023 Cardiology: Dr. Royann Shivers 06/23/2023 Nephrology: Dr. Ronalee Belts 06/24/2023 Neurology: Dr.Bhagat 06/24/2023 Palliative care: Dr. Patterson Hammersmith 06/25/2023  Procedures:  CT abdomen and pelvis 07/05/2023 CT head 06/15/2023, 06/24/2023, 07/01/2023 Chest x-ray 06/15/2023, 06/17/2023 Modified barium swallow 06/19/2023 Abdominal films 07/05/2023, 07/06/2023 MRI brain 06/25/2023 MRA head 06/25/2023 Right upper quadrant ultrasound 06/22/2023 2D echo 06/16/2023 Carotid Dopplers 06/25/2023 CT chest abdomen and pelvis 06/17/2023 EEG 06/25/2023 Irrigation and debridement perirectal abscess per general surgery: Dr. Derrell Lolling 06/18/2023 PICC line placement 07/08/2023  Antimicrobials:  Anti-infectives (From admission, onward)    Start     Dose/Rate Route Frequency Ordered Stop   07/05/23 1600  fluconazole (DIFLUCAN) IVPB 200 mg        200 mg 100 mL/hr over 60 Minutes Intravenous Every 24 hours 07/05/23 1320 07/08/23 1658   07/02/23 1000  fluconazole (DIFLUCAN) tablet 100 mg  Status:  Discontinued        100 mg Oral Daily 07/02/23 0802 07/05/23 1320   07/02/23 0800  penicillin g benzathine (BICILLIN LA) 1200000 UNIT/2ML injection 2.4 Million Units        2.4 Million Units Intramuscular Weekly 07/01/23 1209 07/09/23 0950   06/26/23 1500  penicillin g benzathine (BICILLIN LA) 1200000 UNIT/2ML injection 2.4 Million Units        2.4 Million Units Intramuscular   Once 06/26/23 1351 06/28/23 0018   06/24/23 2200  amoxicillin-clavulanate (AUGMENTIN) 500-125 MG per tablet 1 tablet  Status:  Discontinued        1 tablet Oral Every 12 hours 06/24/23 1450 07/03/23 1202   06/24/23 2200  amoxicillin-clavulanate (AUGMENTIN) 500-125 MG per tablet 1 tablet  Status:  Discontinued        1 tablet Oral 2 times daily 06/24/23 1451 06/24/23 1451   06/21/23 1215  doxycycline (VIBRA-TABS) tablet 100 mg  Status:  Discontinued        100 mg Oral Every 12 hours 06/21/23 1128 07/03/23 1202   06/21/23 1215  amoxicillin-clavulanate (AUGMENTIN) 875-125 MG per tablet 1 tablet  Status:  Discontinued        1 tablet Oral Every 12 hours 06/21/23 1128 06/24/23 1450   06/20/23 1200  vancomycin (VANCOREADY) IVPB 1500 mg/300 mL  Status:  Discontinued        1,500 mg 150 mL/hr over 120 Minutes Intravenous Every 36 hours 06/20/23 0719 06/21/23 1128   06/19/23 0000  vancomycin (VANCOREADY) IVPB 1250 mg/250 mL  Status:  Discontinued        1,250 mg 166.7 mL/hr over 90 Minutes Intravenous Every 36 hours 06/18/23 0939 06/20/23 0719   06/18/23 1800  ceFEPIme (MAXIPIME) 2 g in sodium chloride 0.9 % 100 mL IVPB  Status:  Discontinued  2 g 200 mL/hr over 30 Minutes Intravenous Every 12 hours 06/18/23 0939 06/21/23 1128   06/18/23 0600  ceFEPIme (MAXIPIME) 2 g in sodium chloride 0.9 % 100 mL IVPB  Status:  Discontinued        2 g 200 mL/hr over 30 Minutes Intravenous Every 24 hours 06/17/23 0920 06/18/23 0939   06/17/23 1400  vancomycin (VANCOCIN) IVPB 1000 mg/200 mL premix  Status:  Discontinued        1,000 mg 200 mL/hr over 60 Minutes Intravenous Every 36 hours 06/17/23 0920 06/18/23 0939   06/16/23 1800  vancomycin (VANCOREADY) IVPB 750 mg/150 mL  Status:  Discontinued        750 mg 150 mL/hr over 60 Minutes Intravenous Every 24 hours 06/15/23 1639 06/17/23 0920   06/16/23 1100  cefTRIAXone (ROCEPHIN) 2 g in sodium chloride 0.9 % 100 mL IVPB  Status:  Discontinued        2 g 200  mL/hr over 30 Minutes Intravenous Every 24 hours 06/15/23 1453 06/15/23 1455   06/15/23 1800  ceFEPIme (MAXIPIME) 2 g in sodium chloride 0.9 % 100 mL IVPB  Status:  Discontinued        2 g 200 mL/hr over 30 Minutes Intravenous Every 12 hours 06/15/23 1558 06/17/23 0920   06/15/23 1700  metroNIDAZOLE (FLAGYL) IVPB 500 mg  Status:  Discontinued        500 mg 100 mL/hr over 60 Minutes Intravenous Every 12 hours 06/15/23 1455 06/21/23 1128   06/15/23 1630  vancomycin (VANCOREADY) IVPB 1500 mg/300 mL        1,500 mg 150 mL/hr over 120 Minutes Intravenous  Once 06/15/23 1558 06/15/23 2058   06/15/23 1045  cefTRIAXone (ROCEPHIN) 2 g in sodium chloride 0.9 % 100 mL IVPB        2 g 200 mL/hr over 30 Minutes Intravenous  Once 06/15/23 1041 06/15/23 1155         Subjective: Patient asleep.   Objective: Vitals:   07/10/23 2039 07/11/23 0500 07/11/23 0731 07/11/23 1142  BP: 107/73  (!) 132/98 (!) 151/64  Pulse: 79  62 78  Resp: 18  18   Temp: (!) 100.7 F (38.2 C)  99.3 F (37.4 C)   TempSrc: Oral  Oral   SpO2: 95%  99% 100%  Weight:  81.1 kg    Height:        Intake/Output Summary (Last 24 hours) at 07/11/2023 1312 Last data filed at 07/11/2023 1205 Gross per 24 hour  Intake 4264.97 ml  Output 1035 ml  Net 3229.97 ml   Filed Weights   07/09/23 0600 07/10/23 0430 07/11/23 0500  Weight: 79 kg 81.1 kg 81.1 kg    Examination:  General exam: NAD. Respiratory system: CTAB anterior lung fields..  No wheezes, no crackles, no rhonchi.  Fair air movement.  Speaking in full sentences.  Cardiovascular system: Regular rate rhythm no murmurs rubs or gallops.   Gastrointestinal system: Abdomen is obese, less distended, softer, positive bowel sounds.  Nontender to palpation.  No rebound.  No guarding.  Central nervous system: Alert and oriented.  Left facial weakness.  4/5 bilateral upper extremity strength.  4/5 bilateral lower extremity strength.   Extremities: Symmetric 5 x 5  power. Skin: No rashes, lesions or ulcers Psychiatry: Judgement and insight appear fair. Mood & affect appropriate.     Data Reviewed: I have personally reviewed following labs and imaging studies  CBC: Recent Labs  Lab 07/07/23 0700 07/08/23 0548 07/09/23  0300 07/10/23 0322 07/11/23 0219  WBC 7.6 7.3 7.1 6.3 7.2  HGB 12.7* 11.6* 10.2* 10.4* 10.3*  HCT 41.4 37.3* 31.0* 31.7* 31.1*  MCV 94.5 92.8 89.9 88.8 88.1  PLT 277 231 202 198 190    Basic Metabolic Panel: Recent Labs  Lab 07/06/23 0518 07/06/23 1452 07/07/23 0523 07/07/23 0700 07/08/23 0548 07/09/23 0300 07/09/23 1806 07/10/23 0322 07/11/23 0219  NA 149*   < >  --    < > 143 138 136 134* 135  K 2.6*   < >  --    < > 2.7* 2.3* 2.6* 2.9* 2.3*  CL 119*   < >  --    < > 119* 112* 106 103 103  CO2 17*   < >  --    < > 14* 20* 22 25 24   GLUCOSE 124*   < >  --    < > 121* 123* 179* 143* 99  BUN 32*   < >  --    < > 17 11 7* 6* 7*  CREATININE 1.62*   < >  --    < > 1.48* 1.24 1.23 1.20 1.18  CALCIUM 8.9   < >  --    < > 7.6* 6.8* 7.2* 7.1* 7.5*  MG 2.7*  --  2.3  --  2.4 1.9  --  2.0  --   PHOS  --   --   --   --   --   --   --  1.4* 2.1*   < > = values in this interval not displayed.    GFR: Estimated Creatinine Clearance: 50.6 mL/min (by C-G formula based on SCr of 1.18 mg/dL).  Liver Function Tests: Recent Labs  Lab 07/08/23 0548 07/10/23 0322 07/11/23 0219  AST 25  --   --   ALT 24  --   --   ALKPHOS 35*  --   --   BILITOT 0.6  --   --   PROT 6.3*  --   --   ALBUMIN 2.5* 2.3* 2.4*    CBG: Recent Labs  Lab 07/10/23 1621 07/10/23 2041 07/11/23 0013 07/11/23 0732 07/11/23 1206  GLUCAP 97 116* 123* 95 107*     No results found for this or any previous visit (from the past 240 hour(s)).       Radiology Studies: DG Abd Portable 1V  Result Date: 07/10/2023 CLINICAL DATA:  Ileus EXAM: PORTABLE ABDOMEN - 1 VIEW COMPARISON:  07/09/2023 FINDINGS: No significant change in diffusely distended  loops of bowel throughout the central abdomen, measuring up to 10.8 cm in caliber. No obvious free air in the abdomen. IMPRESSION: No significant change in diffusely distended loops of bowel throughout the central abdomen, measuring up to 10.8 cm in caliber. Findings are consistent with ileus. No obvious free air in the abdomen. Electronically Signed   By: Jearld Lesch M.D.   On: 07/10/2023 13:22        Scheduled Meds:  Chlorhexidine Gluconate Cloth  6 each Topical Daily   insulin aspart  0-15 Units Subcutaneous Q4H   insulin glargine-yfgn  8 Units Subcutaneous QHS   magic mouthwash  10 mL Oral QID   metoCLOPramide (REGLAN) injection  5 mg Intravenous Q8H   metoprolol tartrate  5 mg Intravenous Q6H   mouth rinse  15 mL Mouth Rinse 4 times per day   pantoprazole (PROTONIX) IV  40 mg Intravenous Q24H   polyethylene glycol  17 g Oral  Daily   simethicone  40 mg Oral QID   sodium chloride flush  3 mL Intravenous Q12H   Continuous Infusions:  dextrose 5 % and 0.9 % NaCl with KCl 40 mEq/L 75 mL/hr at 07/11/23 1241   heparin 1,050 Units/hr (07/11/23 1205)   potassium chloride 10 mEq (07/11/23 1242)   potassium PHOSPHATE IVPB (in mmol) 30 mmol (07/11/23 1125)     LOS: 26 days    Time spent: 40 minutes    Ramiro Harvest, MD [facility]   To contact the attending provider between 7A-7P or the covering provider during after hours 7P-7A, please log into the web site [email or web address] and access using universal  password for that web site. If you do not have the password, please call the hospital operator.  07/11/2023, 1:12 PM

## 2023-07-11 NOTE — Progress Notes (Signed)
PHARMACY - ANTICOAGULATION CONSULT NOTE  Pharmacy Consult for IV heparin Indication: atrial fibrillation  No Known Allergies  Patient Measurements: Height: 5\' 5"  (165.1 cm) Weight: 81.1 kg (178 lb 12.7 oz) IBW/kg (Calculated) : 61.5 Heparin Dosing Weight: 80.7 kg   Vital Signs: Temp: 100.7 F (38.2 C) (11/09 2039) Temp Source: Oral (11/09 2039) BP: 107/73 (11/09 2039) Pulse Rate: 79 (11/09 2039)  Labs: Recent Labs    07/08/23 0933 07/08/23 2140 07/09/23 0300 07/09/23 1014 07/09/23 1806 07/10/23 0322 07/11/23 0219  HGB  --   --  10.2*  --   --  10.4* 10.3*  HCT  --   --  31.0*  --   --  31.7* 31.1*  PLT  --   --  202  --   --  198 190  APTT 188* 141*  --  78*  --   --   --   HEPARINUNFRC  --   --   --  0.47 0.40 0.31 0.19*  CREATININE  --   --  1.24  --  1.23 1.20  --     Estimated Creatinine Clearance: 49.7 mL/min (by C-G formula based on SCr of 1.2 mg/dL).   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   06/15/2023 at am   ergocalciferol (VITAMIN D2) 1.25 MG (50000 UT) capsule Take 50,000 Units by mouth every 30 (thirty) days. On the 7 th of every month.   06/07/2023 at am   furosemide (LASIX) 40 MG tablet Take 1 tablet (40 mg total) by mouth daily. 30 tablet  06/15/2023 at am   latanoprost (XALATAN) 0.005 % ophthalmic solution Place 1 drop into both eyes at bedtime.   06/15/2023 at pm   levETIRAcetam (KEPPRA) 500 MG tablet Take 500 mg by mouth 2 (two) times daily.   06/15/2023 at pm   metoprolol tartrate (LOPRESSOR) 25 MG tablet Take 1 tablet (25 mg total)  by mouth 2 (two) times daily.   06/15/2023 at pm   PARoxetine (PAXIL) 10 MG tablet Take 10 mg by mouth daily.   06/15/2023 at am   potassium chloride SA (K-DUR,KLOR-CON) 20 MEQ tablet Take 2 tablets (40 mEq total) by mouth daily.   06/15/2023 at am   simvastatin (ZOCOR) 20 MG tablet Take 20 mg by mouth at bedtime.   06/15/2023 at pm    Assessment: Pharmacy is consulted to start hepairn drip on 77 yo male diagnosed with atrial fibrillation. Pt has been changed to heparin drip. Last dose of apixaban at 0904 on 11/4.   Today, 07/11/23  Heparin level 0.19- now sub-therapeutic on heparin 900 units/hr CBC: Hgb low/stable, pltc WNL  No bleeding or infusion related complications reported by RN Scr 1.2- improved to baseline  Goal of Therapy:  Heparin level 0.3-0.7 units/ml Monitor platelets by anticoagulation protocol: Yes   Plan:  Heparin 2000 unit IV bolus x1  Increase heparin rate to 1050 units/hr Daily heparin level & CBC  Monitor for signs and symptoms of bleeding F/U ability to resume apixaban  Thank you for allowing pharmacy to be a part of this patient's care.  Junita Push, PharmD, BCPS 07/11/2023 3:34 AM

## 2023-07-12 ENCOUNTER — Ambulatory Visit: Payer: 59 | Attending: General Practice | Admitting: General Practice

## 2023-07-12 ENCOUNTER — Inpatient Hospital Stay (HOSPITAL_COMMUNITY): Payer: Medicare (Managed Care)

## 2023-07-12 DIAGNOSIS — R4182 Altered mental status, unspecified: Secondary | ICD-10-CM | POA: Diagnosis not present

## 2023-07-12 DIAGNOSIS — R1310 Dysphagia, unspecified: Secondary | ICD-10-CM | POA: Diagnosis not present

## 2023-07-12 DIAGNOSIS — A419 Sepsis, unspecified organism: Secondary | ICD-10-CM | POA: Diagnosis not present

## 2023-07-12 DIAGNOSIS — I4891 Unspecified atrial fibrillation: Secondary | ICD-10-CM | POA: Diagnosis not present

## 2023-07-12 LAB — GLUCOSE, CAPILLARY
Glucose-Capillary: 101 mg/dL — ABNORMAL HIGH (ref 70–99)
Glucose-Capillary: 137 mg/dL — ABNORMAL HIGH (ref 70–99)
Glucose-Capillary: 85 mg/dL (ref 70–99)
Glucose-Capillary: 90 mg/dL (ref 70–99)
Glucose-Capillary: 92 mg/dL (ref 70–99)
Glucose-Capillary: 96 mg/dL (ref 70–99)

## 2023-07-12 LAB — BASIC METABOLIC PANEL
Anion gap: 8 (ref 5–15)
Anion gap: 8 (ref 5–15)
BUN: 7 mg/dL — ABNORMAL LOW (ref 8–23)
BUN: 6 mg/dL — ABNORMAL LOW (ref 8–23)
CO2: 22 mmol/L (ref 22–32)
CO2: 22 mmol/L (ref 22–32)
Calcium: 7.6 mg/dL — ABNORMAL LOW (ref 8.9–10.3)
Calcium: 7.9 mg/dL — ABNORMAL LOW (ref 8.9–10.3)
Chloride: 107 mmol/L (ref 98–111)
Chloride: 107 mmol/L (ref 98–111)
Creatinine, Ser: 1.16 mg/dL (ref 0.61–1.24)
Creatinine, Ser: 1.21 mg/dL (ref 0.61–1.24)
GFR, Estimated: >60 mL/min (ref >60)
GFR, Estimated: >60 mL/min (ref >60)
Glucose, Bld: 109 mg/dL — ABNORMAL HIGH (ref 70–99)
Glucose, Bld: 180 mg/dL — ABNORMAL HIGH (ref 70–99)
Potassium: 2.6 mmol/L — CL (ref 3.5–5.1)
Potassium: 2.5 mmol/L — CL (ref 3.5–5.1)
Sodium: 137 mmol/L (ref 135–145)
Sodium: 137 mmol/L (ref 135–145)

## 2023-07-12 LAB — CBC
HCT: 31.1 % — ABNORMAL LOW (ref 39.0–52.0)
Hemoglobin: 10 g/dL — ABNORMAL LOW (ref 13.0–17.0)
MCH: 29.1 pg (ref 26.0–34.0)
MCHC: 32.2 g/dL (ref 30.0–36.0)
MCV: 90.4 fL (ref 80.0–100.0)
Platelets: 182 10*3/uL (ref 150–400)
RBC: 3.44 MIL/uL — ABNORMAL LOW (ref 4.22–5.81)
RDW: 15.4 % (ref 11.5–15.5)
WBC: 5.9 10*3/uL (ref 4.0–10.5)
nRBC: 0 % (ref 0.0–0.2)

## 2023-07-12 LAB — RENAL FUNCTION PANEL
Albumin: 2.2 g/dL — ABNORMAL LOW (ref 3.5–5.0)
Anion gap: 5 (ref 5–15)
BUN: 7 mg/dL — ABNORMAL LOW (ref 8–23)
CO2: 24 mmol/L (ref 22–32)
Calcium: 7.3 mg/dL — ABNORMAL LOW (ref 8.9–10.3)
Chloride: 105 mmol/L (ref 98–111)
Creatinine, Ser: 1.14 mg/dL (ref 0.61–1.24)
GFR, Estimated: >60 mL/min (ref >60)
Glucose, Bld: 88 mg/dL (ref 70–99)
Phosphorus: 2.7 mg/dL (ref 2.5–4.6)
Potassium: 2.3 mmol/L — CL (ref 3.5–5.1)
Sodium: 134 mmol/L — ABNORMAL LOW (ref 135–145)

## 2023-07-12 LAB — HEPARIN LEVEL (UNFRACTIONATED): Heparin Unfractionated: 0.31 [IU]/mL (ref 0.30–0.70)

## 2023-07-12 LAB — MAGNESIUM: Magnesium: 1.9 mg/dL (ref 1.7–2.4)

## 2023-07-12 MED ORDER — POTASSIUM CHLORIDE 10 MEQ/100ML IV SOLN
10.0000 meq | INTRAVENOUS | Status: AC
  Administered 2023-07-12 (×4): 10 meq via INTRAVENOUS
  Filled 2023-07-12 (×4): qty 100

## 2023-07-12 MED ORDER — KCL IN DEXTROSE-NACL 40-5-0.9 MEQ/L-%-% IV SOLN
INTRAVENOUS | Status: AC
  Filled 2023-07-12 (×2): qty 1000

## 2023-07-12 MED ORDER — ALBUMIN HUMAN 25 % IV SOLN
25.0000 g | Freq: Two times a day (BID) | INTRAVENOUS | Status: AC
  Administered 2023-07-12 – 2023-07-13 (×4): 25 g via INTRAVENOUS
  Filled 2023-07-12 (×4): qty 100

## 2023-07-12 MED ORDER — CALCIUM GLUCONATE-NACL 2-0.675 GM/100ML-% IV SOLN
2.0000 g | Freq: Once | INTRAVENOUS | Status: AC
  Administered 2023-07-12: 2000 mg via INTRAVENOUS
  Filled 2023-07-12: qty 100

## 2023-07-12 MED ORDER — POTASSIUM CHLORIDE 10 MEQ/100ML IV SOLN
10.0000 meq | INTRAVENOUS | Status: AC
  Administered 2023-07-12 (×6): 10 meq via INTRAVENOUS
  Filled 2023-07-12 (×6): qty 100

## 2023-07-12 NOTE — Plan of Care (Signed)
Problem: Education: Goal: Ability to describe self-care measures that may prevent or decrease complications (Diabetes Survival Skills Education) will improve 07/12/2023 1645 by Aileen Pilot, LPN Outcome: Progressing 07/12/2023 1644 by Aileen Pilot, LPN Outcome: Progressing 07/12/2023 1643 by Aileen Pilot, LPN Outcome: Progressing 07/12/2023 1642 by Aileen Pilot, LPN Outcome: Progressing Goal: Individualized Educational Video(s) 07/12/2023 1645 by Aileen Pilot, LPN Outcome: Progressing 07/12/2023 1644 by Aileen Pilot, LPN Outcome: Progressing 07/12/2023 1643 by Aileen Pilot, LPN Outcome: Progressing 07/12/2023 1642 by Aileen Pilot, LPN Outcome: Progressing   Problem: Coping: Goal: Ability to adjust to condition or change in health will improve 07/12/2023 1645 by Aileen Pilot, LPN Outcome: Progressing 07/12/2023 1644 by Aileen Pilot, LPN Outcome: Progressing 07/12/2023 1643 by Aileen Pilot, LPN Outcome: Progressing 07/12/2023 1642 by Aileen Pilot, LPN Outcome: Progressing   Problem: Fluid Volume: Goal: Ability to maintain a balanced intake and output will improve 07/12/2023 1645 by Aileen Pilot, LPN Outcome: Progressing 07/12/2023 1644 by Aileen Pilot, LPN Outcome: Progressing 07/12/2023 1643 by Aileen Pilot, LPN Outcome: Progressing 07/12/2023 1642 by Aileen Pilot, LPN Outcome: Progressing   Problem: Health Behavior/Discharge Planning: Goal: Ability to identify and utilize available resources and services will improve 07/12/2023 1645 by Aileen Pilot, LPN Outcome: Progressing 07/12/2023 1644 by Aileen Pilot, LPN Outcome: Progressing 07/12/2023 1643 by Aileen Pilot, LPN Outcome: Progressing 07/12/2023 1642 by Aileen Pilot, LPN Outcome: Progressing Goal: Ability to manage health-related needs will improve 07/12/2023 1645 by Aileen Pilot, LPN Outcome: Progressing 07/12/2023 1644 by Aileen Pilot, LPN Outcome: Progressing 07/12/2023 1643 by  Aileen Pilot, LPN Outcome: Progressing 07/12/2023 1642 by Aileen Pilot, LPN Outcome: Progressing   Problem: Metabolic: Goal: Ability to maintain appropriate glucose levels will improve 07/12/2023 1645 by Aileen Pilot, LPN Outcome: Progressing 07/12/2023 1644 by Aileen Pilot, LPN Outcome: Progressing 07/12/2023 1643 by Aileen Pilot, LPN Outcome: Progressing 07/12/2023 1642 by Aileen Pilot, LPN Outcome: Progressing   Problem: Nutritional: Goal: Maintenance of adequate nutrition will improve 07/12/2023 1645 by Aileen Pilot, LPN Outcome: Progressing 07/12/2023 1644 by Aileen Pilot, LPN Outcome: Progressing 07/12/2023 1643 by Aileen Pilot, LPN Outcome: Progressing 07/12/2023 1642 by Aileen Pilot, LPN Outcome: Progressing Goal: Progress toward achieving an optimal weight will improve 07/12/2023 1645 by Aileen Pilot, LPN Outcome: Progressing 07/12/2023 1644 by Aileen Pilot, LPN Outcome: Progressing 07/12/2023 1643 by Aileen Pilot, LPN Outcome: Progressing 07/12/2023 1642 by Aileen Pilot, LPN Outcome: Progressing   Problem: Tissue Perfusion: Goal: Adequacy of tissue perfusion will improve 07/12/2023 1645 by Aileen Pilot, LPN Outcome: Progressing 07/12/2023 1644 by Aileen Pilot, LPN Outcome: Progressing 07/12/2023 1643 by Aileen Pilot, LPN Outcome: Progressing 07/12/2023 1642 by Aileen Pilot, LPN Outcome: Progressing   Problem: Education: Goal: Knowledge of General Education information will improve Description: Including pain rating scale, medication(s)/side effects and non-pharmacologic comfort measures 07/12/2023 1645 by Aileen Pilot, LPN Outcome: Progressing 07/12/2023 1644 by Aileen Pilot, LPN Outcome: Progressing 07/12/2023 1643 by Aileen Pilot, LPN Outcome: Progressing 07/12/2023 1642 by Aileen Pilot, LPN Outcome: Progressing   Problem: Health Behavior/Discharge Planning: Goal: Ability to manage health-related needs will  improve 07/12/2023 1645 by Aileen Pilot, LPN Outcome: Progressing 07/12/2023 1644 by Aileen Pilot, LPN Outcome: Progressing 07/12/2023 1643 by Aileen Pilot, LPN Outcome: Progressing 07/12/2023 1642 by Aileen Pilot, LPN Outcome: Progressing   Problem: Clinical Measurements: Goal: Ability to maintain clinical measurements within normal limits will improve 07/12/2023 1645 by Aileen Pilot, LPN Outcome: Progressing 07/12/2023 1644 by Aileen Pilot, LPN Outcome: Progressing 07/12/2023 1643 by Aileen Pilot, LPN Outcome: Progressing 07/12/2023 1642 by Aileen Pilot, LPN Outcome: Progressing Goal: Will  remain free from infection 07/12/2023 1645 by Aileen Pilot, LPN Outcome: Progressing 07/12/2023 1644 by Aileen Pilot, LPN Outcome: Progressing 07/12/2023 1643 by Aileen Pilot, LPN Outcome: Progressing 07/12/2023 1642 by Aileen Pilot, LPN Outcome: Progressing Goal: Diagnostic test results will improve 07/12/2023 1645 by Aileen Pilot, LPN Outcome: Progressing 07/12/2023 1644 by Aileen Pilot, LPN Outcome: Progressing 07/12/2023 1643 by Aileen Pilot, LPN Outcome: Progressing 07/12/2023 1642 by Aileen Pilot, LPN Outcome: Progressing Goal: Respiratory complications will improve 07/12/2023 1645 by Aileen Pilot, LPN Outcome: Progressing 07/12/2023 1644 by Aileen Pilot, LPN Outcome: Progressing 07/12/2023 1643 by Aileen Pilot, LPN Outcome: Progressing 07/12/2023 1642 by Aileen Pilot, LPN Outcome: Progressing Goal: Cardiovascular complication will be avoided 07/12/2023 1645 by Aileen Pilot, LPN Outcome: Progressing 07/12/2023 1644 by Aileen Pilot, LPN Outcome: Progressing 07/12/2023 1643 by Aileen Pilot, LPN Outcome: Progressing 07/12/2023 1642 by Aileen Pilot, LPN Outcome: Progressing   Problem: Activity: Goal: Risk for activity intolerance will decrease 07/12/2023 1645 by Aileen Pilot, LPN Outcome: Progressing 07/12/2023 1644 by Aileen Pilot,  LPN Outcome: Progressing 07/12/2023 1643 by Aileen Pilot, LPN Outcome: Progressing 07/12/2023 1642 by Aileen Pilot, LPN Outcome: Progressing   Problem: Coping: Goal: Level of anxiety will decrease 07/12/2023 1645 by Aileen Pilot, LPN Outcome: Progressing 07/12/2023 1644 by Aileen Pilot, LPN Outcome: Progressing 07/12/2023 1643 by Aileen Pilot, LPN Outcome: Progressing 07/12/2023 1642 by Aileen Pilot, LPN Outcome: Progressing   Problem: Elimination: Goal: Will not experience complications related to bowel motility 07/12/2023 1645 by Aileen Pilot, LPN Outcome: Progressing 07/12/2023 1644 by Aileen Pilot, LPN Outcome: Progressing 07/12/2023 1643 by Aileen Pilot, LPN Outcome: Progressing 07/12/2023 1642 by Aileen Pilot, LPN Outcome: Progressing Goal: Will not experience complications related to urinary retention 07/12/2023 1645 by Aileen Pilot, LPN Outcome: Progressing 07/12/2023 1644 by Aileen Pilot, LPN Outcome: Progressing 07/12/2023 1643 by Aileen Pilot, LPN Outcome: Progressing 07/12/2023 1642 by Aileen Pilot, LPN Outcome: Progressing   Problem: Pain Managment: Goal: General experience of comfort will improve 07/12/2023 1645 by Aileen Pilot, LPN Outcome: Progressing 07/12/2023 1644 by Aileen Pilot, LPN Outcome: Progressing 07/12/2023 1643 by Aileen Pilot, LPN Outcome: Progressing 07/12/2023 1642 by Aileen Pilot, LPN Outcome: Progressing   Problem: Skin Integrity: Goal: Risk for impaired skin integrity will decrease 07/12/2023 1645 by Aileen Pilot, LPN Outcome: Progressing 07/12/2023 1644 by Aileen Pilot, LPN Outcome: Progressing 07/12/2023 1643 by Aileen Pilot, LPN Outcome: Progressing 07/12/2023 1642 by Aileen Pilot, LPN Outcome: Progressing   Problem: Clinical Measurements: Goal: Diagnostic test results will improve 07/12/2023 1645 by Aileen Pilot, LPN Outcome: Progressing 07/12/2023 1644 by Aileen Pilot, LPN Outcome:  Progressing 07/12/2023 1643 by Aileen Pilot, LPN Outcome: Progressing 07/12/2023 1642 by Aileen Pilot, LPN Outcome: Progressing Goal: Signs and symptoms of infection will decrease 07/12/2023 1645 by Aileen Pilot, LPN Outcome: Progressing 07/12/2023 1644 by Aileen Pilot, LPN Outcome: Progressing 07/12/2023 1643 by Aileen Pilot, LPN Outcome: Progressing 07/12/2023 1642 by Aileen Pilot, LPN Outcome: Progressing   Problem: Respiratory: Goal: Ability to maintain adequate ventilation will improve 07/12/2023 1645 by Aileen Pilot, LPN Outcome: Progressing 07/12/2023 1644 by Aileen Pilot, LPN Outcome: Progressing 07/12/2023 1643 by Aileen Pilot, LPN Outcome: Progressing 07/12/2023 1642 by Aileen Pilot, LPN Outcome: Progressing   Problem: Education: Goal: Ability to describe self-care measures that may prevent or decrease complications (Diabetes Survival Skills Education) will improve 07/12/2023 1645 by Aileen Pilot, LPN Outcome: Progressing 07/12/2023 1644 by Aileen Pilot, LPN Outcome: Progressing 07/12/2023 1643 by Aileen Pilot, LPN Outcome: Progressing 07/12/2023 1642 by Aileen Pilot, LPN Outcome: Progressing Goal: Individualized Educational Video(s) 07/12/2023 1645 by Genia Hotter,  Jovonne Wilton, LPN Outcome: Progressing 07/12/2023 1644 by Aileen Pilot, LPN Outcome: Progressing 07/12/2023 1643 by Aileen Pilot, LPN Outcome: Progressing 07/12/2023 1642 by Aileen Pilot, LPN Outcome: Progressing   Problem: Education: Goal: Ability to describe self-care measures that may prevent or decrease complications (Diabetes Survival Skills Education) will improve 07/12/2023 1645 by Aileen Pilot, LPN Outcome: Progressing 07/12/2023 1644 by Aileen Pilot, LPN Outcome: Progressing 07/12/2023 1643 by Aileen Pilot, LPN Outcome: Progressing 07/12/2023 1642 by Aileen Pilot, LPN Outcome: Progressing Goal: Individualized Educational Video(s) 07/12/2023 1645 by Aileen Pilot,  LPN Outcome: Progressing 07/12/2023 1644 by Aileen Pilot, LPN Outcome: Progressing 07/12/2023 1643 by Aileen Pilot, LPN Outcome: Progressing 07/12/2023 1642 by Aileen Pilot, LPN Outcome: Progressing

## 2023-07-12 NOTE — Progress Notes (Signed)
PROGRESS NOTE    Eric Zamora  ZOX:[id number] DOB: 1946/01/24 DOA: 06/15/2023 PCP: Uvaldo Bristle, PA-C    Chief Complaint  Patient presents with   Altered Mental Status    Brief Narrative:  Eric Zamora is a 77 y.o. 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 10/15 admitted to stepdown unit. 10/17: Overnight with fever, aspiration event, new AG acidosis, transferred back to SDU for BiPAP and insulin drip 10/18: taken to OR for drainage of abscess 10/23 cardiology was consulted for new onset A-fib with RVR. 10/24 nephrology was consulted for AKI and hyponatremia.  CT head was obtained for ongoing AMS and was found to have acute/subacute right MCA territory infarct.  Neurology was also consulted. 10/25.  Palliative care was consulted. 10/31.  Anticoagulation started again. 11/1.  Second dose of Bicillin given.  Underwent MBS. 11/2.  Marinol started for poor p.o. intake. 11/4.  Found to have ileus.  GI consulted.  Recommended DNR to legal guardian. 11/5.  Two-physician DNR form was signed and sent to guardian.  Will await decision.   Assessment & Plan:   Principal Problem:   Severe sepsis due to perirectal abscess(HCC) Active Problems:   Atrial fibrillation with rapid ventricular response (HCC)   Diabetic ketoacidosis without coma associated with type 2 diabetes mellitus (HCC)   Acute renal failure superimposed on stage 3b chronic kidney disease (HCC)   Dysphagia   CAD- Moderate LAD disease with moderate to severe ostial first diagonal disease by cath 07/2013 - not ammendable to PCI. 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 -Penrose drain placed and later removed.  No cultures were sent. -Patient noted to remain on IV vancomycin, Flagyl, Rocephin subsequently transitioned to cefepime and has been transition to Augmentin and doxycycline and recommending a total of 2 weeks of antibiotic treatment from initial I&D. -Antibiotic course completed. -Supportive care.  2.  Colonic ileus -Noted on x-ray 07/05/2023 and subsequently confirmed on CT scans. -Patient seen in consultation by GI who recommended conservative measures for now with repletion of electrolytes to keep potassium approximately 4, magnesium approximately 2. -Patient noted to have large watery bowel movement over the past 3 days.  -Patient with large watery bowel movement this morning in the rectal tube per RN..   -Patient with clinical improvement, abdomen less distended, softer, diffusely nontender to palpation with large watery stools. -Decreased MiraLAX to daily.  -Continue Reglan 5 mg IV every 8 hours. -Full liquid diet. -IV fluids. -Rectal tube in place. -Palliative care consulted and DNR recommended to legal guardian. -Frequent turns.  3.  New onset A-fib with  RVR/HOCM -Patient during the hospitalization to have new onset A-fib with RVR with CHADS2Vasc score of 4. -Patient seen in consultation by cardiology was on metoprolol 100 mg 4 times daily subsequently changed to  Toprol-XL. -Patient placed back on IV Lopressor due to colonic ileus. -Was on Eliquis for anticoagulation now on IV heparin due to ileus. -Likely resume Eliquis once ileus has resolved.  4.  Acute metabolic encephalopathy/acute right MCA stroke/history of dementia -Patient noted to have a progressive worsening mentation early on in the hospitalization. -CT head done 06/24/2023 with acute/subacute cortical and subcortical right MCA territory infarct. -MRI brain done consistent with acute infarct. -MRA brain done with no acute abnormalities. -Carotid Dopplers done with no significant ICA stenosis. -EEG done negative for seizures. -Repeat head CT done on 07/01/2023 per neurology recommendations showed no hemorrhagic conversion and as such neurology recommended resumption of anticoagulation with Eliquis. -Patient being followed by SLP was on dysphagia 1 diet however currently on full liquids due to colonic ileus. -PT/OT following and recommending SNF placement.  5.  History of seizures -EEG done negative for seizures/epileptiform activity. -Depakote on hold due to hepatic function. -Was on Keppra which is currently on hold.  6.  Moderate MR -Monitor volume status.  7.  Dysphagia -Secondary to acute CVA. -Being followed by SLP, underwent MBS was on a dysphagia 1 diet and subsequently downgraded to clear liquids due to colonic ileus and now has been advanced to a full liquid diet.   -Once ileus resolved will resume dysphagia 1 diet. -SLP following.  8.  Severe hypokalemia/hypocalcemia/hypophosphatemia -Potassium at 2.3 this morning, magnesium at 1.9. -Phosphorus at 2.7. -Corrected calcium at approximately 9.04. -Continue D5 normal saline with 40 mEq of KCl.   -KCl 10 mEq IV every hour x 4 runs given this morning. -Calcium gluconate 2 g IV x 1. -Repeat labs this afternoon. -Repeat labs in the AM.  9.  Hypernatremia -Likely secondary to hypovolemic hypernatremia secondary to  dehydration. -Improved with hydration and change of IV fluids to D5W. -Was on D5W which was changed to half-normal saline, and subsequently changed to normal saline.  -Continue D5 normal saline with 40 mEq of potassium. -Repeat labs in the AM.  10.  Possible thrush/possible pill induced gastritis -Patient completed course of antibiotics. -Was on Diflucan through 07/08/2023. -Status post completion of nystatin swish and swallow.  11.  History of syphilis -RPR reactive for 2 years. -Dr. Allena Katz discussed with ID and patient currently being treated with Bicillin on a weekly basis.  With next dose 07/09/2023.  12.  AKI on CKD 3A -Baseline creatinine approximately 1.3 and noted to have worsening to 1.99. -Improved with hydration creatinine currently at 1.16. -Continue IV fluids.    13.  Non anion gap metabolic acidosis -Likely secondary to CKD. -Patient with an initial worsening acidosis and as such patient started on a bicarb drip. -Acidosis resolved. -Bicarb drip discontinued.  14.  CAD -Patient not on DAPT or antiplatelets secondary to being on Eliquis. -Currently on IV heparin due to ileus. -Outpatient follow-up with cardiology.  15.  Bipolar disorder -Not on any medications prior to admission. -Stable.  16.  Poor oral intake -Likely multifactorial secondary to acute CVA and colonic ileus. -Was initially placed on Marinol which is currently on hold due to ileus. -Tolerating full liquid diet.  17.  Goals of care conversation -Dr. Allena Katz discussed with legal guardian on the phone. -Patient noted with poor prognosis due to large right MCA infarct with deficits and dysphagia with  dysarthria. -Patient noted with poor oral intake and now with colonic ileus currently under conservative treatment. -It is felt with acute CVA patient likely will not return to his prior status prior to admission. -It is felt that the patient has a cardiac arrest prognosis remains very poor to return to  his prior status prior to arrest. -Patient with poor quality of life and Dr. Allena Katz recommended legal guardian to consider DNR for patient. -Palliative care following.     DVT prophylaxis: Heparin Code Status: DNR Family Communication: Updated patient.  No family at bedside. Disposition: SNF once ileus has resolved and clinically improved and tolerating oral diet and repletion of electrolytes.  Status is: Inpatient Remains inpatient appropriate because: Severity of illness   Consultants:  General surgery: Dr. Derrell Lolling 06/18/2023 Gastroenterology: Dr. Ewing Schlein 07/05/2023 Cardiology: Dr. Royann Shivers 06/23/2023 Nephrology: Dr. Ronalee Belts 06/24/2023 Neurology: Dr.Bhagat 06/24/2023 Palliative care: Dr. Patterson Hammersmith 06/25/2023  Procedures:  CT abdomen and pelvis 07/05/2023 CT head 06/15/2023, 06/24/2023, 07/01/2023 Chest x-ray 06/15/2023, 06/17/2023 Modified barium swallow 06/19/2023 Abdominal films 07/05/2023, 07/06/2023, 07/08/2023, 07/09/2023, 07/10/2023, 07/12/2023 MRI brain 06/25/2023 MRA head 06/25/2023 Right upper quadrant ultrasound 06/22/2023 2D echo 06/16/2023 Carotid Dopplers 06/25/2023 CT chest abdomen and pelvis 06/17/2023 EEG 06/25/2023 Irrigation and debridement perirectal abscess per general surgery: Dr. Derrell Lolling 06/18/2023 PICC line placement 07/08/2023  Antimicrobials:  Anti-infectives (From admission, onward)    Start     Dose/Rate Route Frequency Ordered Stop   07/05/23 1600  fluconazole (DIFLUCAN) IVPB 200 mg        200 mg 100 mL/hr over 60 Minutes Intravenous Every 24 hours 07/05/23 1320 07/08/23 1658   07/02/23 1000  fluconazole (DIFLUCAN) tablet 100 mg  Status:  Discontinued        100 mg Oral Daily 07/02/23 0802 07/05/23 1320   07/02/23 0800  penicillin g benzathine (BICILLIN LA) 1200000 UNIT/2ML injection 2.4 Million Units        2.4 Million Units Intramuscular Weekly 07/01/23 1209 07/09/23 0950   06/26/23 1500  penicillin g benzathine (BICILLIN LA) 1200000 UNIT/2ML  injection 2.4 Million Units        2.4 Million Units Intramuscular  Once 06/26/23 1351 06/28/23 0018   06/24/23 2200  amoxicillin-clavulanate (AUGMENTIN) 500-125 MG per tablet 1 tablet  Status:  Discontinued        1 tablet Oral Every 12 hours 06/24/23 1450 07/03/23 1202   06/24/23 2200  amoxicillin-clavulanate (AUGMENTIN) 500-125 MG per tablet 1 tablet  Status:  Discontinued        1 tablet Oral 2 times daily 06/24/23 1451 06/24/23 1451   06/21/23 1215  doxycycline (VIBRA-TABS) tablet 100 mg  Status:  Discontinued        100 mg Oral Every 12 hours 06/21/23 1128 07/03/23 1202   06/21/23 1215  amoxicillin-clavulanate (AUGMENTIN) 875-125 MG per tablet 1 tablet  Status:  Discontinued        1 tablet Oral Every 12 hours 06/21/23 1128 06/24/23 1450   06/20/23 1200  vancomycin (VANCOREADY) IVPB 1500 mg/300 mL  Status:  Discontinued        1,500 mg 150 mL/hr over 120 Minutes Intravenous Every 36 hours 06/20/23 0719 06/21/23 1128   06/19/23 0000  vancomycin (VANCOREADY) IVPB 1250 mg/250 mL  Status:  Discontinued        1,250 mg 166.7 mL/hr over 90 Minutes Intravenous Every 36 hours 06/18/23 0939 06/20/23 0719   06/18/23 1800  ceFEPIme (MAXIPIME) 2 g in sodium chloride 0.9 % 100 mL IVPB  Status:  Discontinued  2 g 200 mL/hr over 30 Minutes Intravenous Every 12 hours 06/18/23 0939 06/21/23 1128   06/18/23 0600  ceFEPIme (MAXIPIME) 2 g in sodium chloride 0.9 % 100 mL IVPB  Status:  Discontinued        2 g 200 mL/hr over 30 Minutes Intravenous Every 24 hours 06/17/23 0920 06/18/23 0939   06/17/23 1400  vancomycin (VANCOCIN) IVPB 1000 mg/200 mL premix  Status:  Discontinued        1,000 mg 200 mL/hr over 60 Minutes Intravenous Every 36 hours 06/17/23 0920 06/18/23 0939   06/16/23 1800  vancomycin (VANCOREADY) IVPB 750 mg/150 mL  Status:  Discontinued        750 mg 150 mL/hr over 60 Minutes Intravenous Every 24 hours 06/15/23 1639 06/17/23 0920   06/16/23 1100  cefTRIAXone (ROCEPHIN) 2 g in  sodium chloride 0.9 % 100 mL IVPB  Status:  Discontinued        2 g 200 mL/hr over 30 Minutes Intravenous Every 24 hours 06/15/23 1453 06/15/23 1455   06/15/23 1800  ceFEPIme (MAXIPIME) 2 g in sodium chloride 0.9 % 100 mL IVPB  Status:  Discontinued        2 g 200 mL/hr over 30 Minutes Intravenous Every 12 hours 06/15/23 1558 06/17/23 0920   06/15/23 1700  metroNIDAZOLE (FLAGYL) IVPB 500 mg  Status:  Discontinued        500 mg 100 mL/hr over 60 Minutes Intravenous Every 12 hours 06/15/23 1455 06/21/23 1128   06/15/23 1630  vancomycin (VANCOREADY) IVPB 1500 mg/300 mL        1,500 mg 150 mL/hr over 120 Minutes Intravenous  Once 06/15/23 1558 06/15/23 2058   06/15/23 1045  cefTRIAXone (ROCEPHIN) 2 g in sodium chloride 0.9 % 100 mL IVPB        2 g 200 mL/hr over 30 Minutes Intravenous  Once 06/15/23 1041 06/15/23 1155         Subjective: Patient asleep.   Objective: Vitals:   07/11/23 1142 07/11/23 2003 07/12/23 0450 07/12/23 0526  BP: (!) 151/64 127/64  (!) 140/112  Pulse: 78 79  (!) 110  Resp:  16  16  Temp: 98.6 F (37 C) 100.1 F (37.8 C)  98.6 F (37 C)  TempSrc:      SpO2: 100% 100%  100%  Weight:   84.2 kg   Height:        Intake/Output Summary (Last 24 hours) at 07/12/2023 1140 Last data filed at 07/12/2023 0618 Gross per 24 hour  Intake 2325.58 ml  Output 1250 ml  Net 1075.58 ml   Filed Weights   07/10/23 0430 07/11/23 0500 07/12/23 0450  Weight: 81.1 kg 81.1 kg 84.2 kg    Examination:  General exam: NAD. Respiratory system: CTAB anterior lung fields..  No wheezes, no crackles, no rhonchi.  Fair air movement.  Speaking in full sentences.  Cardiovascular system: Regular rate rhythm no murmurs rubs or gallops.   Gastrointestinal system: Abdomen is obese, less distended, softer, positive bowel sounds.  Nontender to palpation.  No rebound.  No guarding.  Central nervous system: Alert and oriented.  Left facial weakness.  4/5 bilateral upper extremity  strength.  4/5 bilateral lower extremity strength.   Extremities: Symmetric 5 x 5 power. Skin: No rashes, lesions or ulcers Psychiatry: Judgement and insight appear fair. Mood & affect appropriate.     Data Reviewed: I have personally reviewed following labs and imaging studies  CBC: Recent Labs  Lab 07/08/23 0548  07/09/23 0300 07/10/23 0322 07/11/23 0219 07/12/23 0154  WBC 7.3 7.1 6.3 7.2 5.9  HGB 11.6* 10.2* 10.4* 10.3* 10.0*  HCT 37.3* 31.0* 31.7* 31.1* 31.1*  MCV 92.8 89.9 88.8 88.1 90.4  PLT 231 202 198 190 182    Basic Metabolic Panel: Recent Labs  Lab 07/07/23 0523 07/07/23 0700 07/08/23 0548 07/09/23 0300 07/09/23 1806 07/10/23 0322 07/11/23 0219 07/12/23 0154 07/12/23 0926  NA  --    < > 143 138 136 134* 135 134* 137  K  --    < > 2.7* 2.3* 2.6* 2.9* 2.3* 2.3* 2.6*  CL  --    < > 119* 112* 106 103 103 105 107  CO2  --    < > 14* 20* 22 25 24 24 22   GLUCOSE  --    < > 121* 123* 179* 143* 99 88 109*  BUN  --    < > 17 11 7* 6* 7* 7* 7*  CREATININE  --    < > 1.48* 1.24 1.23 1.20 1.18 1.14 1.16  CALCIUM  --    < > 7.6* 6.8* 7.2* 7.1* 7.5* 7.3* 7.6*  MG 2.3  --  2.4 1.9  --  2.0  --  1.9  --   PHOS  --   --   --   --   --  1.4* 2.1* 2.7  --    < > = values in this interval not displayed.    GFR: Estimated Creatinine Clearance: 52.4 mL/min (by C-G formula based on SCr of 1.16 mg/dL).  Liver Function Tests: Recent Labs  Lab 07/08/23 0548 07/10/23 0322 07/11/23 0219 07/12/23 0154  AST 25  --   --   --   ALT 24  --   --   --   ALKPHOS 35*  --   --   --   BILITOT 0.6  --   --   --   PROT 6.3*  --   --   --   ALBUMIN 2.5* 2.3* 2.4* 2.2*    CBG: Recent Labs  Lab 07/11/23 1544 07/11/23 2042 07/12/23 0041 07/12/23 0405 07/12/23 0730  GLUCAP 139* 156* 85 92 96     No results found for this or any previous visit (from the past 240 hour(s)).       Radiology Studies: No results found.      Scheduled Meds:  Chlorhexidine Gluconate  Cloth  6 each Topical Daily   insulin aspart  0-15 Units Subcutaneous Q4H   insulin glargine-yfgn  8 Units Subcutaneous QHS   magic mouthwash  10 mL Oral QID   metoCLOPramide (REGLAN) injection  5 mg Intravenous Q8H   metoprolol tartrate  5 mg Intravenous Q6H   mouth rinse  15 mL Mouth Rinse 4 times per day   pantoprazole (PROTONIX) IV  40 mg Intravenous Q24H   polyethylene glycol  17 g Oral Daily   simethicone  40 mg Oral QID   sodium chloride flush  3 mL Intravenous Q12H   Continuous Infusions:  albumin human 25 g (07/12/23 1032)   dextrose 5 % and 0.9 % NaCl with KCl 40 mEq/L 75 mL/hr at 07/12/23 0951   heparin 1,050 Units/hr (07/11/23 1701)     LOS: 27 days    Time spent: 40 minutes    Ramiro Harvest, MD [facility]   To contact the attending provider between 7A-7P or the covering provider during after hours 7P-7A, please log into the web site  [email or web address] and access using universal [place] password for that web site. If you do not have the password, please call the hospital operator.  07/12/2023, 11:40 AM

## 2023-07-12 NOTE — Evaluation (Signed)
Occupational Therapy Treatment Patient Details Name: Eric Zamora MRN: [id number] DOB: 09-May-1946 Today's Date: 07/12/2023   History of present illness Pt is 77 yo admitted with severe sepsis due to perirectal abscess.  Pt to OR on 10/18 for drainage of abscess.  Pt with increased lethargy on 10/24 , MRI Head performed and pt found to have acute infarct  right frontal operculum with moderate associated cytotoxic edema and petechial hemorrhage. Pt now found to have ileus 07/06/23.PMH: bipolar disorder, dementia, history of NSTEMI, hypertrophic cardiomyopathy, rectal tube   OT comments  RN cleared pt for participation in therapy. Requires +2 max assist for bed mobility, min-mod +2 assist for sitting EOB balance for self-care task and facilitation of anterior weight shift for midline orientation. Posterior and R lateral lean present. Pt fatigues quickly but with overall good efforts. Continue OT POC for functional gains. Patient will benefit from continued inpatient follow up therapy, <3 hours/day.       If plan is discharge home, recommend the following:  Two people to help with walking and/or transfers;Two people to help with bathing/dressing/bathroom;Direct supervision/assist for medications management;Help with stairs or ramp for entrance;Supervision due to cognitive status;Assist for transportation   Equipment Recommendations  Other (comment)       Precautions / Restrictions Precautions Precautions: Fall Restrictions Weight Bearing Restrictions: No       Mobility Bed Mobility Overal bed mobility: Needs Assistance Bed Mobility: Supine to Sit Rolling: +2 for physical assistance, Used rails, Max assist   Supine to sit: Max assist, +2 for physical assistance, HOB elevated Sit to supine: Mod assist   General bed mobility comments: assist for trunk, hips and BLE support with pt initiating minimally    Transfers                   General transfer comment: NT, pt  declined     Balance Overall balance assessment: Needs assistance Sitting-balance support: No upper extremity supported, Feet supported Sitting balance-Leahy Scale: Poor   Postural control: Posterior lean, Right lateral lean Standing balance support: Bilateral upper extremity supported                               ADL either performed or assessed with clinical judgement   ADL Overall ADL's : Needs assistance/impaired     Grooming: Minimal assistance;Moderate assistance;Sitting Grooming Details (indicate cue type and reason): min - mod assist for sitting balance EOB, once upright, posterior and R lateral lean. washes face briefly before requesting to return to supine                                      Cognition Arousal: Alert Behavior During Therapy: WFL for tasks assessed/performed Overall Cognitive Status: No family/caregiver present to determine baseline cognitive functioning                                 General Comments: alert, answers simple questions, slurred speach, follows commands with increased time and cuing        Exercises Other Exercises Other Exercises: orientation to midline and sitting balance tasks to maintain upright posture for ADLs. Pt works on activation of push--pull motion with L then R UE to promote anterior weight shifts and reduce R lateral lean  General Comments noted skin break down on pt's bottom, rectal tube in place, RN notified    Pertinent Vitals/ Pain       Pain Assessment Pain Assessment: No/denies pain   Frequency  Min 1X/week        Progress Toward Goals  OT Goals(current goals can now be found in the care plan section)  Progress towards OT goals: Progressing toward goals  Acute Rehab OT Goals OT Goal Formulation: Patient unable to participate in goal setting Time For Goal Achievement: 07/26/23 Potential to Achieve Goals: Fair  Plan         AM-PAC OT "6 Clicks"  Daily Activity     Outcome Measure   Help from another person eating meals?: A Little Help from another person taking care of personal grooming?: A Lot Help from another person toileting, which includes using toliet, bedpan, or urinal?: Total Help from another person bathing (including washing, rinsing, drying)?: A Lot Help from another person to put on and taking off regular upper body clothing?: A Lot Help from another person to put on and taking off regular lower body clothing?: A Lot 6 Click Score: 12    End of Session    OT Visit Diagnosis: Other abnormalities of gait and mobility (R26.89);Muscle weakness (generalized) (M62.81);Cognitive communication deficit (R41.841)   Activity Tolerance Patient tolerated treatment well   Patient Left in bed;with call bell/phone within reach;with bed alarm set   Nurse Communication Mobility status        Time: 6962-9528 OT Time Calculation (min): 18 min  Charges: OT General Charges $OT Visit: 1 Visit OT Treatments $Self Care/Home Management : 8-22 mins  Malorie Bigford L. Dewie Ahart, OTR/L  07/12/23, 4:27 PM

## 2023-07-12 NOTE — Progress Notes (Signed)
Speech Language Pathology Treatment: Dysphagia  Patient Details Name: Eric Zamora MRN: [id number] DOB: 08-24-1946 Today's Date: 07/12/2023 Time: 4098-1191 SLP Time Calculation (min) (ACUTE ONLY): 11 min  Assessment / Plan / Recommendation Clinical Impression  Patient see by sLP for skilled intervention focused on dysphagia management. Patient was awake, alert, and cooperative throughout the session. Speech remains dysarthric. Patient directly observed with nectar thick liquid via straw and thin liquid via Provale 10cc rate controlled cup. Patient without overt s/sx of aspiration with either consistency. Bolus formation and transit seemed slightly prolonged while swallow initiation appeared delayed. Nectar thick continues to be most appropriate consistency at this time. GI recommending full liquid diet at this time. Patient may upgrade to puree, nectar thick at [facility] recommendation. ST to continue to acutely follow up for skilled intervention focused on dysphagia management.   HPI HPI: 77 yo male adm with AMS, foul urine odor and dyspnea.  Pt is a resident of [address].  He experienced an aspiration episode yesterday with medications with applesauce and then experienced fever and respiratory difficulties requiring Bipap. Pt has h/o falls, dementia, Afib, CHF. CXR  06/17/2023 Patchy opacities throughout the lungs bilaterally, predominantly dependent, which are concerning for aspiration pneumonia/pneumonitis given the clinical concern for aspiration. 2. Small bilateral pleural effusions (right greater than left). 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 MBS completed on 10/19. Patient on dys 1 (puree) and honey thick liquids. Reeval completed clinically 11/1, and pt is charged to dys1/nectar liquids. GI initiated full liqiuid diet on 11/04 due to chronic illeus.      SLP Plan  Continue with current plan of care      Recommendations for follow up  therapy are one component of a multi-disciplinary discharge planning process, led by the attending physician.  Recommendations may be updated based on patient status, additional functional criteria and insurance authorization.    Recommendations  Diet recommendations: Other(comment);Nectar-thick liquid (Full liquids) Liquids provided via: Cup;Straw Medication Administration: Whole meds with puree Supervision: Patient able to self feed;Intermittent supervision to cue for compensatory strategies Compensations: Slow rate;Small sips/bites;Minimize environmental distractions Postural Changes and/or Swallow Maneuvers: Seated upright 90 degrees;Upright 30-60 min after meal                  Oral care BID   Frequent or constant Supervision/Assistance Dysphagia, oropharyngeal phase (R13.12)     Continue with current plan of care     Marline Backbone, Senaida Lange., Speech Therapy Student    07/12/2023, 2:24 PM

## 2023-07-12 NOTE — Plan of Care (Signed)
Problem: Education: Goal: Ability to describe self-care measures that may prevent or decrease complications (Diabetes Survival Skills Education) will improve Outcome: Progressing Goal: Individualized Educational Video(s) Outcome: Progressing   Problem: Coping: Goal: Ability to adjust to condition or change in health will improve Outcome: Progressing   Problem: Fluid Volume: Goal: Ability to maintain a balanced intake and output will improve Outcome: Progressing   Problem: Health Behavior/Discharge Planning: Goal: Ability to identify and utilize available resources and services will improve Outcome: Progressing Goal: Ability to manage health-related needs will improve Outcome: Progressing   Problem: Metabolic: Goal: Ability to maintain appropriate glucose levels will improve Outcome: Progressing   Problem: Nutritional: Goal: Maintenance of adequate nutrition will improve Outcome: Progressing Goal: Progress toward achieving an optimal weight will improve Outcome: Progressing   Problem: Skin Integrity: Goal: Risk for impaired skin integrity will decrease Outcome: Progressing   Problem: Tissue Perfusion: Goal: Adequacy of tissue perfusion will improve Outcome: Progressing   Problem: Education: Goal: Knowledge of General Education information will improve Description: Including pain rating scale, medication(s)/side effects and non-pharmacologic comfort measures Outcome: Progressing   Problem: Health Behavior/Discharge Planning: Goal: Ability to manage health-related needs will improve Outcome: Progressing   Problem: Clinical Measurements: Goal: Ability to maintain clinical measurements within normal limits will improve Outcome: Progressing Goal: Will remain free from infection Outcome: Progressing Goal: Diagnostic test results will improve Outcome: Progressing Goal: Respiratory complications will improve Outcome: Progressing Goal: Cardiovascular complication will  be avoided Outcome: Progressing   Problem: Activity: Goal: Risk for activity intolerance will decrease Outcome: Progressing   Problem: Nutrition: Goal: Adequate nutrition will be maintained Outcome: Progressing   Problem: Coping: Goal: Level of anxiety will decrease Outcome: Progressing   Problem: Elimination: Goal: Will not experience complications related to bowel motility Outcome: Progressing Goal: Will not experience complications related to urinary retention Outcome: Progressing   Problem: Pain Managment: Goal: General experience of comfort will improve Outcome: Progressing   Problem: Safety: Goal: Ability to remain free from injury will improve Outcome: Progressing   Problem: Skin Integrity: Goal: Risk for impaired skin integrity will decrease Outcome: Progressing   Problem: Fluid Volume: Goal: Hemodynamic stability will improve Outcome: Progressing   Problem: Clinical Measurements: Goal: Diagnostic test results will improve Outcome: Progressing Goal: Signs and symptoms of infection will decrease Outcome: Progressing   Problem: Respiratory: Goal: Ability to maintain adequate ventilation will improve Outcome: Progressing   Problem: Education: Goal: Ability to describe self-care measures that may prevent or decrease complications (Diabetes Survival Skills Education) will improve Outcome: Progressing Goal: Individualized Educational Video(s) Outcome: Progressing   Problem: Cardiac: Goal: Ability to maintain an adequate cardiac output will improve Outcome: Progressing   Problem: Health Behavior/Discharge Planning: Goal: Ability to identify and utilize available resources and services will improve Outcome: Progressing Goal: Ability to manage health-related needs will improve Outcome: Progressing   Problem: Fluid Volume: Goal: Ability to achieve a balanced intake and output will improve Outcome: Progressing   Problem: Metabolic: Goal: Ability to  maintain appropriate glucose levels will improve Outcome: Progressing   Problem: Nutritional: Goal: Maintenance of adequate nutrition will improve Outcome: Progressing Goal: Maintenance of adequate weight for body size and type will improve Outcome: Progressing   Problem: Respiratory: Goal: Will regain and/or maintain adequate ventilation Outcome: Progressing   Problem: Urinary Elimination: Goal: Ability to achieve and maintain adequate renal perfusion and functioning will improve Outcome: Progressing   Problem: Education: Goal: Knowledge of disease or condition will improve Outcome: Progressing Goal: Knowledge of secondary prevention will improve (  MUST DOCUMENT ALL) Outcome: Progressing Goal: Knowledge of patient specific risk factors will improve Loraine Leriche N/A or DELETE if not current risk factor) Outcome: Progressing   Problem: Ischemic Stroke/TIA Tissue Perfusion: Goal: Complications of ischemic stroke/TIA will be minimized Outcome: Progressing

## 2023-07-12 NOTE — Progress Notes (Signed)
PHARMACY - ANTICOAGULATION CONSULT NOTE  Pharmacy Consult for Heparin Indication: New afib with new CVA, h/o DVT  No Known Allergies  Patient Measurements: Height: 5\' 5"  (165.1 cm) Weight: 84.2 kg (185 lb 10 oz) IBW/kg (Calculated) : 61.5 Heparin Dosing Weight: 79 kg  Vital Signs: Temp: 98.6 F (37 C) (11/11 0526) BP: 140/112 (11/11 0526) Pulse Rate: 110 (11/11 0526)  Labs: Recent Labs    07/09/23 1014 07/09/23 1806 07/10/23 0322 07/11/23 0219 07/11/23 1220 07/12/23 0154  HGB  --    < > 10.4* 10.3*  --  10.0*  HCT  --   --  31.7* 31.1*  --  31.1*  PLT  --   --  198 190  --  182  APTT 78*  --   --   --   --   --   HEPARINUNFRC 0.47   < > 0.31 0.19* 0.37 0.31  CREATININE  --    < > 1.20 1.18  --  1.14   < > = values in this interval not displayed.    Estimated Creatinine Clearance: 53.3 mL/min (by C-G formula based on SCr of 1.14 mg/dL).   Medical History: Past Medical History:  Diagnosis Date   Alzheimer disease (HCC)    Bipolar affective disorder (HCC)    Chronic renal insufficiency    Dementia (HCC)    Dyslipidemia    HTN (hypertension)    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   gradient on TEE   Syncopal episodes     Assessment: AC/Heme:  new Eliquis 5 bid for new afib with CHADS2Vasc score of 4., hx DVT (not on AC PTA) Stop Plavix and aspirin given new start Eliquis.  - 10/24: acute right MCA stroke - CT shows expected changes after hemorrhage; appears stable. Per Neuro, benefits of AC outweigh risks of bleeding at this point.  - 10/31: Resume anticoag - 11/4: Apixaban back to UFH due to ileus on 11/4 - Hep level 0.31, Hgb 10 relatively stable, Plts 182 stable.  Goal of Therapy:  Heparin level 0.3-0.5 units/ml Monitor platelets by anticoagulation protocol: Yes   Plan:  Con't IV heparin at 1050 units/hr Daily HL and CBC  Puja Caffey S. Merilynn Finland, PharmD, BCPS Clinical Staff Pharmacist [email or web address] Merilynn Finland, [employer] 07/12/2023,7:51 AM

## 2023-07-13 ENCOUNTER — Inpatient Hospital Stay (HOSPITAL_COMMUNITY): Payer: Medicare (Managed Care)

## 2023-07-13 DIAGNOSIS — R1310 Dysphagia, unspecified: Secondary | ICD-10-CM | POA: Diagnosis not present

## 2023-07-13 DIAGNOSIS — R4182 Altered mental status, unspecified: Secondary | ICD-10-CM | POA: Diagnosis not present

## 2023-07-13 DIAGNOSIS — A419 Sepsis, unspecified organism: Secondary | ICD-10-CM | POA: Diagnosis not present

## 2023-07-13 DIAGNOSIS — I4891 Unspecified atrial fibrillation: Secondary | ICD-10-CM | POA: Diagnosis not present

## 2023-07-13 LAB — RENAL FUNCTION PANEL
Albumin: 2.4 g/dL — ABNORMAL LOW (ref 3.5–5.0)
Anion gap: 7 (ref 5–15)
BUN: 5 mg/dL — ABNORMAL LOW (ref 8–23)
CO2: 21 mmol/L — ABNORMAL LOW (ref 22–32)
Calcium: 7.6 mg/dL — ABNORMAL LOW (ref 8.9–10.3)
Chloride: 110 mmol/L (ref 98–111)
Creatinine, Ser: 1.18 mg/dL (ref 0.61–1.24)
GFR, Estimated: >60 mL/min (ref >60)
Glucose, Bld: 113 mg/dL — ABNORMAL HIGH (ref 70–99)
Phosphorus: 2.1 mg/dL — ABNORMAL LOW (ref 2.5–4.6)
Potassium: 2.4 mmol/L — CL (ref 3.5–5.1)
Sodium: 138 mmol/L (ref 135–145)

## 2023-07-13 LAB — CBC
HCT: 28.9 % — ABNORMAL LOW (ref 39.0–52.0)
Hemoglobin: 9.3 g/dL — ABNORMAL LOW (ref 13.0–17.0)
MCH: 29 pg (ref 26.0–34.0)
MCHC: 32.2 g/dL (ref 30.0–36.0)
MCV: 90 fL (ref 80.0–100.0)
Platelets: 166 10*3/uL (ref 150–400)
RBC: 3.21 MIL/uL — ABNORMAL LOW (ref 4.22–5.81)
RDW: 15.2 % (ref 11.5–15.5)
WBC: 6 10*3/uL (ref 4.0–10.5)
nRBC: 0 % (ref 0.0–0.2)

## 2023-07-13 LAB — HEPARIN LEVEL (UNFRACTIONATED): Heparin Unfractionated: 0.29 [IU]/mL — ABNORMAL LOW (ref 0.30–0.70)

## 2023-07-13 LAB — BASIC METABOLIC PANEL
Anion gap: 8 (ref 5–15)
BUN: 5 mg/dL — ABNORMAL LOW (ref 8–23)
CO2: 21 mmol/L — ABNORMAL LOW (ref 22–32)
Calcium: 7.8 mg/dL — ABNORMAL LOW (ref 8.9–10.3)
Chloride: 110 mmol/L (ref 98–111)
Creatinine, Ser: 1.19 mg/dL (ref 0.61–1.24)
GFR, Estimated: >60 mL/min (ref >60)
Glucose, Bld: 112 mg/dL — ABNORMAL HIGH (ref 70–99)
Potassium: 2.5 mmol/L — CL (ref 3.5–5.1)
Sodium: 139 mmol/L (ref 135–145)

## 2023-07-13 LAB — GLUCOSE, CAPILLARY
Glucose-Capillary: 101 mg/dL — ABNORMAL HIGH (ref 70–99)
Glucose-Capillary: 104 mg/dL — ABNORMAL HIGH (ref 70–99)
Glucose-Capillary: 107 mg/dL — ABNORMAL HIGH (ref 70–99)
Glucose-Capillary: 114 mg/dL — ABNORMAL HIGH (ref 70–99)
Glucose-Capillary: 120 mg/dL — ABNORMAL HIGH (ref 70–99)
Glucose-Capillary: 147 mg/dL — ABNORMAL HIGH (ref 70–99)
Glucose-Capillary: 87 mg/dL (ref 70–99)

## 2023-07-13 LAB — MAGNESIUM: Magnesium: 1.8 mg/dL (ref 1.7–2.4)

## 2023-07-13 MED ORDER — POTASSIUM PHOSPHATES 15 MMOLE/5ML IV SOLN
30.0000 mmol | Freq: Once | INTRAVENOUS | Status: AC
  Administered 2023-07-13: 30 mmol via INTRAVENOUS
  Filled 2023-07-13: qty 10

## 2023-07-13 MED ORDER — METOCLOPRAMIDE HCL 5 MG/ML IJ SOLN
5.0000 mg | Freq: Two times a day (BID) | INTRAMUSCULAR | Status: DC
  Administered 2023-07-13 – 2023-07-15 (×5): 5 mg via INTRAVENOUS
  Filled 2023-07-13 (×5): qty 2

## 2023-07-13 MED ORDER — POTASSIUM CHLORIDE 10 MEQ/100ML IV SOLN
10.0000 meq | INTRAVENOUS | Status: AC
  Administered 2023-07-13 (×6): 10 meq via INTRAVENOUS
  Filled 2023-07-13 (×6): qty 100

## 2023-07-13 MED ORDER — POTASSIUM CHLORIDE 10 MEQ/100ML IV SOLN
10.0000 meq | INTRAVENOUS | Status: AC
  Administered 2023-07-13 (×4): 10 meq via INTRAVENOUS
  Filled 2023-07-13 (×4): qty 100

## 2023-07-13 MED ORDER — KCL IN DEXTROSE-NACL 40-5-0.9 MEQ/L-%-% IV SOLN
INTRAVENOUS | Status: AC
Start: 2023-07-13 — End: 2023-07-14
  Filled 2023-07-13 (×2): qty 1000

## 2023-07-13 MED ORDER — ENOXAPARIN SODIUM 80 MG/0.8ML IJ SOSY
80.0000 mg | PREFILLED_SYRINGE | Freq: Two times a day (BID) | INTRAMUSCULAR | Status: DC
  Administered 2023-07-13 – 2023-07-26 (×25): 80 mg via SUBCUTANEOUS
  Filled 2023-07-13 (×27): qty 0.8

## 2023-07-13 NOTE — Progress Notes (Signed)
PT Cancellation Note  Patient Details Name: Eric Zamora MRN: [id number] DOB: [birth year]   Cancelled Treatment:    Reason Eval/Treat Not Completed: Patient declined, no reason specified (pt's speech is slurred and difficult to understand, however he clearly stated, "no" several times when asked to participate in PT. Will follow.)   Tamala Ser PT 07/13/2023  Acute Rehabilitation Services  Office [phone]

## 2023-07-13 NOTE — Plan of Care (Signed)
Problem: Education: Goal: Ability to describe self-care measures that may prevent or decrease complications (Diabetes Survival Skills Education) will improve Outcome: Progressing Goal: Individualized Educational Video(s) Outcome: Progressing   Problem: Coping: Goal: Ability to adjust to condition or change in health will improve Outcome: Progressing   Problem: Fluid Volume: Goal: Ability to maintain a balanced intake and output will improve Outcome: Progressing   Problem: Health Behavior/Discharge Planning: Goal: Ability to identify and utilize available resources and services will improve Outcome: Progressing Goal: Ability to manage health-related needs will improve Outcome: Progressing   Problem: Metabolic: Goal: Ability to maintain appropriate glucose levels will improve Outcome: Progressing   Problem: Nutritional: Goal: Maintenance of adequate nutrition will improve Outcome: Progressing Goal: Progress toward achieving an optimal weight will improve Outcome: Progressing   Problem: Skin Integrity: Goal: Risk for impaired skin integrity will decrease Outcome: Progressing   Problem: Tissue Perfusion: Goal: Adequacy of tissue perfusion will improve Outcome: Progressing   Problem: Education: Goal: Knowledge of General Education information will improve Description: Including pain rating scale, medication(s)/side effects and non-pharmacologic comfort measures Outcome: Progressing   Problem: Health Behavior/Discharge Planning: Goal: Ability to manage health-related needs will improve Outcome: Progressing   Problem: Clinical Measurements: Goal: Ability to maintain clinical measurements within normal limits will improve Outcome: Progressing Goal: Will remain free from infection Outcome: Progressing Goal: Diagnostic test results will improve Outcome: Progressing Goal: Respiratory complications will improve Outcome: Progressing Goal: Cardiovascular complication will  be avoided Outcome: Progressing   Problem: Activity: Goal: Risk for activity intolerance will decrease Outcome: Progressing   Problem: Nutrition: Goal: Adequate nutrition will be maintained Outcome: Progressing   Problem: Coping: Goal: Level of anxiety will decrease Outcome: Progressing   Problem: Elimination: Goal: Will not experience complications related to bowel motility Outcome: Progressing Goal: Will not experience complications related to urinary retention Outcome: Progressing   Problem: Pain Managment: Goal: General experience of comfort will improve Outcome: Progressing   Problem: Safety: Goal: Ability to remain free from injury will improve Outcome: Progressing   Problem: Skin Integrity: Goal: Risk for impaired skin integrity will decrease Outcome: Progressing   Problem: Fluid Volume: Goal: Hemodynamic stability will improve Outcome: Progressing   Problem: Clinical Measurements: Goal: Diagnostic test results will improve Outcome: Progressing Goal: Signs and symptoms of infection will decrease Outcome: Progressing   Problem: Respiratory: Goal: Ability to maintain adequate ventilation will improve Outcome: Progressing   Problem: Education: Goal: Ability to describe self-care measures that may prevent or decrease complications (Diabetes Survival Skills Education) will improve Outcome: Progressing Goal: Individualized Educational Video(s) Outcome: Progressing   Problem: Cardiac: Goal: Ability to maintain an adequate cardiac output will improve Outcome: Progressing   Problem: Health Behavior/Discharge Planning: Goal: Ability to identify and utilize available resources and services will improve Outcome: Progressing Goal: Ability to manage health-related needs will improve Outcome: Progressing   Problem: Fluid Volume: Goal: Ability to achieve a balanced intake and output will improve Outcome: Progressing   Problem: Metabolic: Goal: Ability to  maintain appropriate glucose levels will improve Outcome: Progressing   Problem: Nutritional: Goal: Maintenance of adequate nutrition will improve Outcome: Progressing Goal: Maintenance of adequate weight for body size and type will improve Outcome: Progressing   Problem: Respiratory: Goal: Will regain and/or maintain adequate ventilation Outcome: Progressing   Problem: Urinary Elimination: Goal: Ability to achieve and maintain adequate renal perfusion and functioning will improve Outcome: Progressing   Problem: Education: Goal: Knowledge of disease or condition will improve Outcome: Progressing Goal: Knowledge of secondary prevention will improve (  MUST DOCUMENT ALL) Outcome: Progressing Goal: Knowledge of patient specific risk factors will improve Loraine Leriche N/A or DELETE if not current risk factor) Outcome: Progressing   Problem: Ischemic Stroke/TIA Tissue Perfusion: Goal: Complications of ischemic stroke/TIA will be minimized Outcome: Progressing

## 2023-07-13 NOTE — Progress Notes (Signed)
PHARMACY - ANTICOAGULATION CONSULT NOTE  Pharmacy Consult for Heparin Indication: New afib with new CVA, h/o DVT  No Known Allergies  Patient Measurements: Height: 5\' 5"  (165.1 cm) Weight: 83.9 kg (184 lb 15.5 oz) IBW/kg (Calculated) : 61.5 Heparin Dosing Weight: 79 kg  Vital Signs: Temp: 98.9 F (37.2 C) (11/11 1955) Temp Source: Oral (11/11 1955) BP: 126/68 (11/11 1955) Pulse Rate: 55 (11/11 1955)  Labs: Recent Labs    07/11/23 0219 07/11/23 1220 07/12/23 0154 07/12/23 0926 07/12/23 1340 07/13/23 0319  HGB 10.3*  --  10.0*  --   --  9.3*  HCT 31.1*  --  31.1*  --   --  28.9*  PLT 190  --  182  --   --  166  HEPARINUNFRC 0.19* 0.37 0.31  --   --  0.29*  CREATININE 1.18  --  1.14 1.16 1.21  --     Estimated Creatinine Clearance: 50.2 mL/min (by C-G formula based on SCr of 1.21 mg/dL).   Medical History: Past Medical History:  Diagnosis Date   Alzheimer disease (HCC)    Bipolar affective disorder (HCC)    Chronic renal insufficiency    Dementia (HCC)    Dyslipidemia    HTN (hypertension)    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   gradient on TEE   Syncopal episodes     Assessment: AC/Heme:  new Eliquis 5 bid for new afib with CHADS2Vasc score of 4., hx DVT (not on AC PTA) Stop Plavix and aspirin given new start Eliquis.  - 10/24: acute right MCA stroke - CT shows expected changes after hemorrhage; appears stable. Per Neuro, benefits of AC outweigh risks of bleeding at this point.  - 10/31: Resume anticoag - 11/4: Apixaban back to UFH due to ileus on 11/4 - Hep level 0.29, Hgb 9.3 relatively stable, Plts 166 stable.  Goal of Therapy:  Heparin level 0.3-0.5 units/ml Monitor platelets by anticoagulation protocol: Yes   Plan:  Increase heparin to 1100 units/hr Heparin level in 8 hours Daily HL and CBC  Arley Phenix RPh 07/13/2023, 5:57 AM

## 2023-07-13 NOTE — TOC Progression Note (Signed)
Transition of Care [facility]) - Progression Note    Patient Details  Name: ADRIENNE WINDERS MRN: [id number] Date of Birth: [birth year]  Transition of Care [facility]) CM/SW Contact  Otelia Santee, LCSW Phone Number: 07/13/2023, 9:35 AM  Clinical Narrative:    TOC continuing to follow for pt's progression for discharge planning.    Expected Discharge Plan: Skilled Nursing Facility Barriers to Discharge: SNF Pending bed offer, Inadequate or no insurance  Expected Discharge Plan and Services     Post Acute Care Choice: Skilled Nursing Facility Living arrangements for the past 2 months: Assisted Living Facility [facility])                                       Social Determinants of Health (SDOH) Interventions SDOH Screenings   Food Insecurity: Patient Unable To Answer (06/15/2023)  Housing: Low Risk  (06/15/2023)  Transportation Needs: Patient Unable To Answer (06/15/2023)  Utilities: Patient Unable To Answer (06/15/2023)  Tobacco Use: Low Risk  (07/05/2023)    Readmission Risk Interventions    06/25/2023    2:17 PM  Readmission Risk Prevention Plan  Transportation Screening Complete  PCP or Specialist Appt within 5-7 Days Complete  Home Care Screening Complete  Medication Review (RN CM) Complete

## 2023-07-13 NOTE — Progress Notes (Signed)
PHARMACY - ANTICOAGULATION CONSULT NOTE  Pharmacy Consult for Heparin >> Lovenox Indication: New afib with new CVA, h/o DVT  No Known Allergies  Patient Measurements: Height: 5\' 5"  (165.1 cm) Weight: 83.9 kg (184 lb 15.5 oz) IBW/kg (Calculated) : 61.5 Heparin Dosing Weight: 79 kg  Vital Signs: Temp: 99.1 F (37.3 C) (11/12 1131) Temp Source: Oral (11/12 1131) BP: 144/82 (11/12 1131) Pulse Rate: 112 (11/12 1131)  Labs: Recent Labs    07/11/23 0219 07/11/23 1220 07/12/23 0154 07/12/23 0926 07/12/23 1340 07/13/23 0319  HGB 10.3*  --  10.0*  --   --  9.3*  HCT 31.1*  --  31.1*  --   --  28.9*  PLT 190  --  182  --   --  166  HEPARINUNFRC 0.19* 0.37 0.31  --   --  0.29*  CREATININE 1.18  --  1.14 1.16 1.21 1.18    Estimated Creatinine Clearance: 51.4 mL/min (by C-G formula based on SCr of 1.18 mg/dL).   Medical History: Past Medical History:  Diagnosis Date   Alzheimer disease (HCC)    Bipolar affective disorder (HCC)    Chronic renal insufficiency    Dementia (HCC)    Dyslipidemia    HTN (hypertension)    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   gradient on TEE   Syncopal episodes     Assessment: AC/Heme:  new Eliquis 5 bid for new afib with CHADS2Vasc score of 4., hx DVT (not on AC PTA) Stop Plavix and aspirin given new start Eliquis.  - 10/24: acute right MCA stroke - CT shows expected changes after hemorrhage; appears stable. Per Neuro, benefits of AC outweigh risks of bleeding at this point.  - 10/31: Resume anticoag - 11/4: Apixaban back to UFH due to ileus on 11/4 - 11/12: transition from UFH >> Lovenox given variable heparin levels and patient is a difficult stick. - Hgb 9.3--relatively stable, Plts--166 stable. No s/sx of bleeding per RN.   Goal of Therapy:  Monitor platelets by anticoagulation protocol: Yes   Plan:  Stop heparin infusion Start Lovenox 80 mg SQ BID F/u CBC in the AM Monitor for s/sx of bleeding daily    Cherylin Mylar, PharmD Clinical Pharmacist  11/12/20241:22 PM

## 2023-07-13 NOTE — Progress Notes (Signed)
   07/13/23 1131  Assess: MEWS Score  Temp 99.1 F (37.3 C)  BP (!) 144/82  MAP (mmHg) 90  Pulse Rate (!) 112  Resp 20  SpO2 100 %  O2 Device Room Air  Assess: MEWS Score  MEWS Temp 0  MEWS Systolic 0  MEWS Pulse 2  MEWS RR 0  MEWS LOC 0  MEWS Score 2  MEWS Score Color Yellow  Assess: if the MEWS score is Yellow or Red  Were vital signs accurate and taken at a resting state? Yes  Does the patient meet 2 or more of the SIRS criteria? No  Does the patient have a confirmed or suspected source of infection? No  MEWS guidelines implemented  Yes, yellow  Treat  MEWS Interventions Considered administering scheduled or prn medications/treatments as ordered  Take Vital Signs  Increase Vital Sign Frequency  Yellow: Q2hr x1, continue Q4hrs until patient remains green for 12hrs  Escalate  MEWS: Escalate Yellow: Discuss with charge nurse and consider notifying provider and/or RRT  Notify: Charge Nurse/RN  Name of Charge Nurse/RN Notified Christian, RN  Provider Notification  Provider Name/Title Rodolph Bong, MD  Date Provider Notified 07/13/23  Time Provider Notified 1135  Method of Notification Face-to-face  Notification Reason Critical Result  Provider response At bedside;No new orders  Date of Provider Response 07/13/23  Time of Provider Response 1136  Assess: SIRS CRITERIA  SIRS Temperature  0  SIRS Pulse 1  SIRS Respirations  0  SIRS WBC 0  SIRS Score Sum  1

## 2023-07-13 NOTE — Progress Notes (Signed)
PROGRESS NOTE    Eric Zamora  WJX:[id number] DOB: 1946/08/25 DOA: 06/15/2023 PCP: Uvaldo Bristle, PA-C    Chief Complaint  Patient presents with   Altered Mental Status    Brief Narrative:  Eric Zamora is a 77 y.o. 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 10/15 admitted to stepdown unit. 10/17: Overnight with fever, aspiration event, new AG acidosis, transferred back to SDU for BiPAP and insulin drip 10/18: taken to OR for drainage of abscess 10/23 cardiology was consulted for new onset A-fib with RVR. 10/24 nephrology was consulted for AKI and hyponatremia.  CT head was obtained for ongoing AMS and was found to have acute/subacute right MCA territory infarct.  Neurology was also consulted. 10/25.  Palliative care was consulted. 10/31.  Anticoagulation started again. 11/1.  Second dose of Bicillin given.  Underwent MBS. 11/2.  Marinol started for poor p.o. intake. 11/4.  Found to have ileus.  GI consulted.  Recommended DNR to legal guardian. 11/5.  Two-physician DNR form was signed and sent to guardian.  Will await decision.   Assessment & Plan:   Principal Problem:   Severe sepsis due to perirectal abscess(HCC) Active Problems:   Atrial fibrillation with rapid ventricular response (HCC)   Diabetic ketoacidosis without coma associated with type 2 diabetes mellitus (HCC)   Acute renal failure superimposed on stage 3b chronic kidney disease (HCC)   Dysphagia   CAD- Moderate LAD disease with moderate to severe ostial first diagonal disease by cath 07/2013 - not ammendable to PCI. 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 -Penrose drain placed and later removed.  No cultures were sent. -Patient noted to remain on IV vancomycin, Flagyl, Rocephin subsequently transitioned to cefepime and has been transition to Augmentin and doxycycline and recommending a total of 2 weeks of antibiotic treatment from initial I&D. -Antibiotic course completed. -Supportive care.  2.  Colonic ileus -Noted on x-ray 07/05/2023 and subsequently confirmed on CT scans. -Patient seen in consultation by GI who recommended conservative measures for now with repletion of electrolytes to keep potassium approximately 4, magnesium approximately 2. -Patient noted to have large watery bowel movement over the past 4 days.  -Patient with large watery bowel movement this morning in the rectal tube per RN..   -Patient with some clinical improvement, abdomen less distended, softer, diffusely nontender to palpation with large watery stools. -MiraLAX has been decreased to daily.   -Decrease Reglan to every 12 hours and if no continued improvement with colonic ileus could consider discontinuation of Reglan.  -Full liquid diet. -IV fluids. -Rectal tube in place. -Palliative care  consulted and DNR recommended to legal guardian. -Frequent turns. -May need to reengage GI if no continued clinical improvement.  3.  New onset A-fib with RVR/HOCM -Patient during the hospitalization to have new  onset A-fib with RVR with CHADS2Vasc score of 4. -Patient seen in consultation by cardiology was on metoprolol 100 mg 4 times daily subsequently changed to Toprol-XL. -Patient placed back on IV Lopressor due to colonic ileus. -Was on Eliquis for anticoagulation now on IV heparin due to ileus. -IV heparin being transitioned to full dose Lovenox. -Likely resume Eliquis once ileus has resolved.  4.  Acute metabolic encephalopathy/acute right MCA stroke/history of dementia -Patient noted to have a progressive worsening mentation early on in the hospitalization. -CT head done 06/24/2023 with acute/subacute cortical and subcortical right MCA territory infarct. -MRI brain done consistent with acute infarct. -MRA brain done with no acute abnormalities. -Carotid Dopplers done with no significant ICA stenosis. -EEG done negative for seizures. -Repeat head CT done on 07/01/2023 per neurology recommendations showed no hemorrhagic conversion and as such neurology recommended resumption of anticoagulation with Eliquis. -Patient being followed by SLP was on dysphagia 1 diet however currently on full liquids due to colonic ileus. -PT/OT following and recommending SNF placement.  5.  History of seizures -EEG done negative for seizures/epileptiform activity. -Depakote on hold due to hepatic function. -Was on Keppra which is currently on hold.  6.  Moderate MR -Monitor volume status.  7.  Dysphagia -Secondary to acute CVA. -Being followed by SLP, underwent MBS was on a dysphagia 1 diet and subsequently downgraded to clear liquids due to colonic ileus and now has been advanced to a full liquid diet.   -Once ileus resolved will resume dysphagia 1 diet. -SLP following.  8.  Severe hypokalemia/hypocalcemia/hypophosphatemia -Potassium at 2.4 this morning, magnesium at 1.8. -Phosphorus at 2.1. -Corrected calcium at approximately 9.04. -Continue D5 normal saline with 40 mEq of KCl.   -KCl 10 mEq IV every  hour x 4 runs given this morning. -Repeat potassium at 2.5, will give KCl 10 mEq IV every hour x 6 runs. -Repeat labs in the AM.  9.  Hypernatremia -Likely secondary to hypovolemic hypernatremia secondary to dehydration. -Improved with hydration and change of IV fluids to D5W. -Was on D5W which was changed to half-normal saline, and subsequently changed to normal saline.  -Continue D5 normal saline with 40 mEq of potassium. -Repeat labs in the AM.  10.  Possible thrush/possible pill induced gastritis -Patient completed course of antibiotics. -Was on Diflucan through 07/08/2023. -Status post completion of nystatin swish and swallow.  11.  History of syphilis -RPR reactive for 2 years. -Dr. Allena Katz discussed with ID and patient currently being treated with Bicillin on a weekly basis.  With next dose 07/09/2023.  12.  AKI on CKD 3A -Baseline creatinine approximately 1.3 and noted to have worsening to 1.99. -Improved with hydration creatinine currently at 1.19. -Continue gentle IV fluids.    13.  Non anion gap metabolic acidosis -Likely secondary to CKD. -Patient with an initial worsening acidosis and as such patient started on a bicarb drip. -Acidosis resolved. -Bicarb drip discontinued.  14.  CAD -Patient not on DAPT or antiplatelets secondary to being on Eliquis. -Currently on IV heparin due to ileus and will transition to Lovenox.  -Outpatient follow-up with cardiology.  15.  Bipolar disorder -Not on any medications prior to admission. -Stable.  16.  Poor oral intake -Likely multifactorial secondary to acute CVA and colonic ileus. -Was initially placed on  Marinol which is currently on hold due to ileus. -Tolerating full liquid diet.  17.  Goals of care conversation -Dr. Allena Katz discussed with legal guardian on the phone. -Patient noted with poor prognosis due to large right MCA infarct with deficits and dysphagia with dysarthria. -Patient noted with poor oral intake and now  with colonic ileus currently under conservative treatment. -It is felt with acute CVA patient likely will not return to his prior status prior to admission. -It is felt that the patient has a cardiac arrest prognosis remains very poor to return to his prior status prior to arrest. -Patient with poor quality of life and Dr. Allena Katz recommended legal guardian to consider DNR for patient. -Palliative care following.     DVT prophylaxis: Heparin>>>> Lovenox Code Status: DNR Family Communication: Updated patient.  No family at bedside. Disposition: SNF once ileus has resolved and clinically improved and tolerating oral diet and repletion of electrolytes.  Status is: Inpatient Remains inpatient appropriate because: Severity of illness   Consultants:  General surgery: Dr. Derrell Lolling 06/18/2023 Gastroenterology: Dr. Ewing Schlein 07/05/2023 Cardiology: Dr. Royann Shivers 06/23/2023 Nephrology: Dr. Ronalee Belts 06/24/2023 Neurology: Dr.Bhagat 06/24/2023 Palliative care: Dr. Patterson Hammersmith 06/25/2023  Procedures:  CT abdomen and pelvis 07/05/2023 CT head 06/15/2023, 06/24/2023, 07/01/2023 Chest x-ray 06/15/2023, 06/17/2023 Modified barium swallow 06/19/2023 Abdominal films 07/05/2023, 07/06/2023, 07/08/2023, 07/09/2023, 07/10/2023, 07/12/2023 MRI brain 06/25/2023 MRA head 06/25/2023 Right upper quadrant ultrasound 06/22/2023 2D echo 06/16/2023 Carotid Dopplers 06/25/2023 CT chest abdomen and pelvis 06/17/2023 EEG 06/25/2023 Irrigation and debridement perirectal abscess per general surgery: Dr. Derrell Lolling 06/18/2023 PICC line placement 07/08/2023  Antimicrobials:  Anti-infectives (From admission, onward)    Start     Dose/Rate Route Frequency Ordered Stop   07/05/23 1600  fluconazole (DIFLUCAN) IVPB 200 mg        200 mg 100 mL/hr over 60 Minutes Intravenous Every 24 hours 07/05/23 1320 07/08/23 1658   07/02/23 1000  fluconazole (DIFLUCAN) tablet 100 mg  Status:  Discontinued        100 mg Oral Daily 07/02/23 0802 07/05/23  1320   07/02/23 0800  penicillin g benzathine (BICILLIN LA) 1200000 UNIT/2ML injection 2.4 Million Units        2.4 Million Units Intramuscular Weekly 07/01/23 1209 07/09/23 0950   06/26/23 1500  penicillin g benzathine (BICILLIN LA) 1200000 UNIT/2ML injection 2.4 Million Units        2.4 Million Units Intramuscular  Once 06/26/23 1351 06/28/23 0018   06/24/23 2200  amoxicillin-clavulanate (AUGMENTIN) 500-125 MG per tablet 1 tablet  Status:  Discontinued        1 tablet Oral Every 12 hours 06/24/23 1450 07/03/23 1202   06/24/23 2200  amoxicillin-clavulanate (AUGMENTIN) 500-125 MG per tablet 1 tablet  Status:  Discontinued        1 tablet Oral 2 times daily 06/24/23 1451 06/24/23 1451   06/21/23 1215  doxycycline (VIBRA-TABS) tablet 100 mg  Status:  Discontinued        100 mg Oral Every 12 hours 06/21/23 1128 07/03/23 1202   06/21/23 1215  amoxicillin-clavulanate (AUGMENTIN) 875-125 MG per tablet 1 tablet  Status:  Discontinued        1 tablet Oral Every 12 hours 06/21/23 1128 06/24/23 1450   06/20/23 1200  vancomycin (VANCOREADY) IVPB 1500 mg/300 mL  Status:  Discontinued        1,500 mg 150 mL/hr over 120 Minutes Intravenous Every 36 hours 06/20/23 0719 06/21/23 1128   06/19/23 0000  vancomycin (VANCOREADY) IVPB 1250 mg/250 mL  Status:  Discontinued        1,250 mg 166.7 mL/hr over 90 Minutes Intravenous Every 36 hours 06/18/23 0939 06/20/23 0719   06/18/23 1800  ceFEPIme (MAXIPIME) 2 g in sodium chloride 0.9 % 100 mL IVPB  Status:  Discontinued        2 g 200 mL/hr over 30 Minutes Intravenous Every 12 hours 06/18/23 0939 06/21/23 1128   06/18/23 0600  ceFEPIme (MAXIPIME) 2 g in sodium chloride 0.9 % 100 mL IVPB  Status:  Discontinued        2 g 200 mL/hr over 30 Minutes Intravenous Every 24 hours 06/17/23 0920 06/18/23 0939   06/17/23 1400  vancomycin (VANCOCIN) IVPB 1000 mg/200 mL premix  Status:  Discontinued        1,000 mg 200 mL/hr over 60 Minutes Intravenous Every 36 hours 06/17/23  0920 06/18/23 0939   06/16/23 1800  vancomycin (VANCOREADY) IVPB 750 mg/150 mL  Status:  Discontinued        750 mg 150 mL/hr over 60 Minutes Intravenous Every 24 hours 06/15/23 1639 06/17/23 0920   06/16/23 1100  cefTRIAXone (ROCEPHIN) 2 g in sodium chloride 0.9 % 100 mL IVPB  Status:  Discontinued        2 g 200 mL/hr over 30 Minutes Intravenous Every 24 hours 06/15/23 1453 06/15/23 1455   06/15/23 1800  ceFEPIme (MAXIPIME) 2 g in sodium chloride 0.9 % 100 mL IVPB  Status:  Discontinued        2 g 200 mL/hr over 30 Minutes Intravenous Every 12 hours 06/15/23 1558 06/17/23 0920   06/15/23 1700  metroNIDAZOLE (FLAGYL) IVPB 500 mg  Status:  Discontinued        500 mg 100 mL/hr over 60 Minutes Intravenous Every 12 hours 06/15/23 1455 06/21/23 1128   06/15/23 1630  vancomycin (VANCOREADY) IVPB 1500 mg/300 mL        1,500 mg 150 mL/hr over 120 Minutes Intravenous  Once 06/15/23 1558 06/15/23 2058   06/15/23 1045  cefTRIAXone (ROCEPHIN) 2 g in sodium chloride 0.9 % 100 mL IVPB        2 g 200 mL/hr over 30 Minutes Intravenous  Once 06/15/23 1041 06/15/23 1155         Subjective: Patient asleep.   Objective: Vitals:   07/12/23 1955 07/13/23 0500 07/13/23 0609 07/13/23 1131  BP: 126/68  105/65 (!) 144/82  Pulse: (!) 55  (!) 59 (!) 112  Resp: 19  20 20   Temp: 98.9 F (37.2 C)  99.8 F (37.7 C) 99.1 F (37.3 C)  TempSrc: Oral  Oral Oral  SpO2: 98%  99% 100%  Weight:  83.9 kg    Height:        Intake/Output Summary (Last 24 hours) at 07/13/2023 1211 Last data filed at 07/13/2023 0630 Gross per 24 hour  Intake 2184.42 ml  Output 500 ml  Net 1684.42 ml   Filed Weights   07/11/23 0500 07/12/23 0450 07/13/23 0500  Weight: 81.1 kg 84.2 kg 83.9 kg    Examination:  General exam: NAD. Respiratory system: CTAB anterior lung fields..  No wheezes, no crackles, no rhonchi.  Fair air movement.  Speaking in full sentences.  Cardiovascular system: Regular rate rhythm no murmurs rubs  or gallops.   Gastrointestinal system: Abdomen is obese, less distended, softer, positive bowel sounds.  Nontender to palpation.  No rebound.  No guarding.  Central nervous system: Alert and oriented.  Left facial weakness.  4/5 bilateral upper extremity strength.  4/5 bilateral lower extremity strength.   Extremities: Symmetric 5 x 5 power. Skin: No rashes, lesions or ulcers Psychiatry: Judgement and insight appear fair. Mood & affect appropriate.     Data Reviewed: I have personally reviewed following labs and imaging studies  CBC: Recent Labs  Lab 07/09/23 0300 07/10/23 0322 07/11/23 0219 07/12/23 0154 07/13/23 0319  WBC 7.1 6.3 7.2 5.9 6.0  HGB 10.2* 10.4* 10.3* 10.0* 9.3*  HCT 31.0* 31.7* 31.1* 31.1* 28.9*  MCV 89.9 88.8 88.1 90.4 90.0  PLT 202 198 190 182 166    Basic Metabolic Panel: Recent Labs  Lab 07/08/23 0548 07/09/23 0300 07/09/23 1806 07/10/23 0322 07/11/23 0219 07/12/23 0154 07/12/23 0926 07/12/23 1340 07/13/23 0319  NA 143 138   < > 134* 135 134* 137 137 138  K 2.7* 2.3*   < > 2.9* 2.3* 2.3* 2.6* 2.5* 2.4*  CL 119* 112*   < > 103 103 105 107 107 110  CO2 14* 20*   < > 25 24 24 22 22  21*  GLUCOSE 121* 123*   < > 143* 99 88 109* 180* 113*  BUN 17 11   < > 6* 7* 7* 7* 6* 5*  CREATININE 1.48* 1.24   < > 1.20 1.18 1.14 1.16 1.21 1.18  CALCIUM 7.6* 6.8*   < > 7.1* 7.5* 7.3* 7.6* 7.9* 7.6*  MG 2.4 1.9  --  2.0  --  1.9  --   --  1.8  PHOS  --   --   --  1.4* 2.1* 2.7  --   --  2.1*   < > = values in this interval not displayed.    GFR: Estimated Creatinine Clearance: 51.4 mL/min (by C-G formula based on SCr of 1.18 mg/dL).  Liver Function Tests: Recent Labs  Lab 07/08/23 0548 07/10/23 0322 07/11/23 0219 07/12/23 0154 07/13/23 0319  AST 25  --   --   --   --   ALT 24  --   --   --   --   ALKPHOS 35*  --   --   --   --   BILITOT 0.6  --   --   --   --   PROT 6.3*  --   --   --   --   ALBUMIN 2.5* 2.3* 2.4* 2.2* 2.4*    CBG: Recent Labs   Lab 07/12/23 2025 07/13/23 0005 07/13/23 0401 07/13/23 0739 07/13/23 1128  GLUCAP 101* 87 107* 114* 101*     No results found for this or any previous visit (from the past 240 hour(s)).       Radiology Studies: DG Abd Portable 1V  Result Date: 07/12/2023 CLINICAL DATA:  Ileus EXAM: PORTABLE ABDOMEN - 1 VIEW COMPARISON:  X-ray 07/10/2023 and older. FINDINGS: Air-filled dilated loops of bowel are seen in the abdomen. These could be colon and has a diameter approaching up to 9.4 cm today, similar to previous. There is some minimal small bowel gas. No obvious free air on this supine radiograph. Film is rotated. Degenerative changes seen of the spine with overlapping cardiac leads. IMPRESSION: Persistent dilatation of bowel. Please correlate with the history. Ileus versus obstruction is in the differential. Additional evaluation as clinically directed Electronically Signed   By: Karen Kays M.D.   On: 07/12/2023 13:10        Scheduled Meds:  Chlorhexidine Gluconate Cloth  6 each Topical Daily   insulin aspart  0-15 Units Subcutaneous Q4H  insulin glargine-yfgn  8 Units Subcutaneous QHS   magic mouthwash  10 mL Oral QID   metoCLOPramide (REGLAN) injection  5 mg Intravenous Q12H   metoprolol tartrate  5 mg Intravenous Q6H   mouth rinse  15 mL Mouth Rinse 4 times per day   pantoprazole (PROTONIX) IV  40 mg Intravenous Q24H   polyethylene glycol  17 g Oral Daily   simethicone  40 mg Oral QID   sodium chloride flush  3 mL Intravenous Q12H   Continuous Infusions:  albumin human 25 g (07/13/23 0917)   dextrose 5 % and 0.9 % NaCl with KCl 40 mEq/L 75 mL/hr at 07/13/23 1050   heparin 1,100 Units/hr (07/13/23 0628)   potassium chloride 10 mEq (07/13/23 1145)   potassium PHOSPHATE IVPB (in mmol)       LOS: 28 days    Time spent: 40 minutes    Ramiro Harvest, MD [facility]   To contact the attending provider between 7A-7P or the covering provider during after  hours 7P-7A, please log into the web site [email or web address] and access using universal [place] password for that web site. If you do not have the password, please call the hospital operator.  07/13/2023, 12:11 PM

## 2023-07-13 NOTE — Progress Notes (Signed)
PT Cancellation Note  Patient Details Name: Eric Zamora MRN: [id number] DOB: October 12, 1945   Cancelled Treatment:    Reason Eval/Treat Not Completed: Medical issues which prohibited therapy (HR [id number] at rest. RN and MD notified. Will follow.)   Tamala Ser PT 07/13/2023  Acute Rehabilitation Services  Office [phone]

## 2023-07-13 NOTE — Plan of Care (Signed)
  Problem: Coping: Goal: Ability to adjust to condition or change in health will improve Outcome: Progressing   Problem: Fluid Volume: Goal: Ability to maintain a balanced intake and output will improve Outcome: Progressing   Problem: Skin Integrity: Goal: Risk for impaired skin integrity will decrease Outcome: Progressing   Problem: Tissue Perfusion: Goal: Adequacy of tissue perfusion will improve Outcome: Progressing   Problem: Clinical Measurements: Goal: Will remain free from infection Outcome: Progressing   Problem: Activity: Goal: Risk for activity intolerance will decrease Outcome: Progressing   Problem: Nutrition: Goal: Adequate nutrition will be maintained Outcome: Progressing   Problem: Coping: Goal: Level of anxiety will decrease Outcome: Progressing   Problem: Pain Managment: Goal: General experience of comfort will improve Outcome: Progressing   Problem: Safety: Goal: Ability to remain free from injury will improve Outcome: Progressing

## 2023-07-13 NOTE — Plan of Care (Signed)
  Problem: Education: Goal: Ability to describe self-care measures that may prevent or decrease complications (Diabetes Survival Skills Education) will improve Outcome: Progressing Goal: Individualized Educational Video(s) Outcome: Progressing   Problem: Coping: Goal: Ability to adjust to condition or change in health will improve Outcome: Progressing   

## 2023-07-14 ENCOUNTER — Inpatient Hospital Stay (HOSPITAL_COMMUNITY): Payer: Medicare (Managed Care)

## 2023-07-14 DIAGNOSIS — R6520 Severe sepsis without septic shock: Secondary | ICD-10-CM

## 2023-07-14 DIAGNOSIS — I5032 Chronic diastolic (congestive) heart failure: Secondary | ICD-10-CM | POA: Diagnosis not present

## 2023-07-14 DIAGNOSIS — E870 Hyperosmolality and hypernatremia: Secondary | ICD-10-CM

## 2023-07-14 DIAGNOSIS — I639 Cerebral infarction, unspecified: Secondary | ICD-10-CM

## 2023-07-14 DIAGNOSIS — A419 Sepsis, unspecified organism: Principal | ICD-10-CM

## 2023-07-14 DIAGNOSIS — K567 Ileus, unspecified: Secondary | ICD-10-CM

## 2023-07-14 DIAGNOSIS — F319 Bipolar disorder, unspecified: Secondary | ICD-10-CM

## 2023-07-14 DIAGNOSIS — I4891 Unspecified atrial fibrillation: Secondary | ICD-10-CM

## 2023-07-14 DIAGNOSIS — E1110 Type 2 diabetes mellitus with ketoacidosis without coma: Secondary | ICD-10-CM | POA: Diagnosis not present

## 2023-07-14 DIAGNOSIS — E876 Hypokalemia: Secondary | ICD-10-CM

## 2023-07-14 DIAGNOSIS — E782 Mixed hyperlipidemia: Secondary | ICD-10-CM

## 2023-07-14 DIAGNOSIS — I63511 Cerebral infarction due to unspecified occlusion or stenosis of right middle cerebral artery: Secondary | ICD-10-CM

## 2023-07-14 LAB — RENAL FUNCTION PANEL
Albumin: 3.3 g/dL — ABNORMAL LOW (ref 3.5–5.0)
Anion gap: 6 (ref 5–15)
BUN: <5 mg/dL — ABNORMAL LOW (ref 8–23)
CO2: 21 mmol/L — ABNORMAL LOW (ref 22–32)
Calcium: 7.5 mg/dL — ABNORMAL LOW (ref 8.9–10.3)
Chloride: 110 mmol/L (ref 98–111)
Creatinine, Ser: 1.12 mg/dL (ref 0.61–1.24)
GFR, Estimated: >60 mL/min (ref >60)
Glucose, Bld: 106 mg/dL — ABNORMAL HIGH (ref 70–99)
Phosphorus: 2.6 mg/dL (ref 2.5–4.6)
Potassium: 2.3 mmol/L — CL (ref 3.5–5.1)
Sodium: 137 mmol/L (ref 135–145)

## 2023-07-14 LAB — GLUCOSE, CAPILLARY
Glucose-Capillary: 105 mg/dL — ABNORMAL HIGH (ref 70–99)
Glucose-Capillary: 82 mg/dL (ref 70–99)
Glucose-Capillary: 90 mg/dL (ref 70–99)
Glucose-Capillary: 125 mg/dL — ABNORMAL HIGH (ref 70–99)
Glucose-Capillary: 82 mg/dL (ref 70–99)

## 2023-07-14 LAB — CBC
HCT: 26.4 % — ABNORMAL LOW (ref 39.0–52.0)
Hemoglobin: 8.7 g/dL — ABNORMAL LOW (ref 13.0–17.0)
MCH: 29.6 pg (ref 26.0–34.0)
MCHC: 33 g/dL (ref 30.0–36.0)
MCV: 89.8 fL (ref 80.0–100.0)
Platelets: 171 10*3/uL (ref 150–400)
RBC: 2.94 MIL/uL — ABNORMAL LOW (ref 4.22–5.81)
RDW: 15.2 % (ref 11.5–15.5)
WBC: 5.7 10*3/uL (ref 4.0–10.5)
nRBC: 0 % (ref 0.0–0.2)

## 2023-07-14 LAB — MAGNESIUM: Magnesium: 1.8 mg/dL (ref 1.7–2.4)

## 2023-07-14 MED ORDER — POTASSIUM CHLORIDE CRYS ER 20 MEQ PO TBCR
40.0000 meq | EXTENDED_RELEASE_TABLET | Freq: Two times a day (BID) | ORAL | Status: AC
  Administered 2023-07-14 (×2): 40 meq via ORAL
  Filled 2023-07-14 (×2): qty 2

## 2023-07-14 MED ORDER — LEVETIRACETAM 500 MG PO TABS
500.0000 mg | ORAL_TABLET | Freq: Two times a day (BID) | ORAL | Status: DC
  Administered 2023-07-14 – 2023-07-28 (×29): 500 mg via ORAL
  Filled 2023-07-14 (×29): qty 1

## 2023-07-14 MED ORDER — MAGNESIUM SULFATE 2 GM/50ML IV SOLN
2.0000 g | Freq: Once | INTRAVENOUS | Status: AC
  Administered 2023-07-14: 2 g via INTRAVENOUS
  Filled 2023-07-14: qty 50

## 2023-07-14 MED ORDER — POTASSIUM CHLORIDE 10 MEQ/100ML IV SOLN
10.0000 meq | INTRAVENOUS | Status: AC
  Administered 2023-07-14 (×6): 10 meq via INTRAVENOUS
  Filled 2023-07-14 (×5): qty 100

## 2023-07-14 NOTE — Plan of Care (Signed)
  Problem: Coping: Goal: Ability to adjust to condition or change in health will improve Outcome: Progressing   Problem: Fluid Volume: Goal: Ability to maintain a balanced intake and output will improve Outcome: Progressing   Problem: Nutritional: Goal: Maintenance of adequate nutrition will improve Outcome: Progressing   Problem: Skin Integrity: Goal: Risk for impaired skin integrity will decrease Outcome: Progressing   Problem: Tissue Perfusion: Goal: Adequacy of tissue perfusion will improve Outcome: Progressing   Problem: Clinical Measurements: Goal: Cardiovascular complication will be avoided Outcome: Progressing   Problem: Elimination: Goal: Will not experience complications related to bowel motility Outcome: Progressing Goal: Will not experience complications related to urinary retention Outcome: Progressing   Problem: Pain Managment: Goal: General experience of comfort will improve Outcome: Progressing

## 2023-07-14 NOTE — Progress Notes (Signed)
PROGRESS NOTE    Eric Zamora  ZOX:[id number] DOB: 1945/09/19 DOA: 06/15/2023 PCP: Eric Bristle, PA-C   Brief Narrative:  Brief hospital course: Eric Zamora is a 77 y.o. 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, CAD, dCHF, HTN, hx DVT no longer on [facility], and HLD who admitted on 10/15 for severe sepsis due to perirectal abscess. 10/15 admitted to stepdown unit. 10/17: Overnight with fever, aspiration event, new AG acidosis, transferred back to SDU for BiPAP and insulin drip 10/18: taken to OR for drainage of abscess 10/23 cardiology was consulted for new onset A-fib with RVR. 10/24 nephrology was consulted for AKI and hyponatremia.  CT head was obtained for ongoing AMS and was found to have acute/subacute right MCA territory infarct.  Neurology was also consulted. 10/25.  Palliative care was consulted. 10/31.  Anticoagulation started again. 11/1.  Second dose of Bicillin given.  Underwent MBS. 11/2.  Marinol started for poor p.o. intake. 11/4.  Found to have ileus.  GI consulted.  Recommended DNR to legal guardian. 11/5.  Two-physician DNR form was signed and sent to guardian.  Will await decision.  ** Patient continues to have severe hypokalemia and so we will obtain a KUB and a GI pathogen panel given his continued diarrhea  Assessment and Plan:  Severe sepsis due to perirectal abscess present on admission. -Patient seen in consultation by general surgery and underwent I&D on 06/18/2023. -Penrose drain placed and later removed.  No cultures were sent. -Patient noted to remain on IV vancomycin, Flagyl, Rocephin subsequently transitioned to cefepime and has been transition to Augmentin and doxycycline and recommending a total of 2 weeks of antibiotic treatment from initial I&D. -Antibiotic course completed. -Supportive care. -WOC nurse consulted and there is concern for a stage II pressure injury but this is the area where the  patient had a repair rectal abscess that was drained and about the same region with a Penrose drain and on today's presentation it appeared to be a healing surgical incision  Colonic ileus -Noted on x-ray 07/05/2023 and subsequently confirmed on CT scans. -Patient seen in consultation by GI who recommended conservative measures for now with repletion of electrolytes to keep potassium approximately 4, magnesium approximately 2. -Patient with some clinical improvement, abdomen less distended, softer, diffusely nontender to palpation with large watery stools. -MiraLAX has been decreased to daily.   -Decrease Reglan to every 12 hours and if no continued improvement with colonic ileus could consider discontinuation of Reglan.  -Full liquid diet. -IV fluids. -Rectal tube in place. -Palliative care consulted and DNR recommended to legal guardian. -Frequent turns. -May need to reengage GI if no continued clinical improvement; Continues to have quite a bit of diarrhea -Repeat KUB today showed "Significantly decreased bowel dilatation as noted above."   New onset A-fib with RVR/HOCM -Patient during the hospitalization to have new onset A-fib with RVR with CHADS2Vasc score of 4. -Patient seen in consultation by cardiology was on metoprolol 100 mg 4 times daily subsequently changed to Toprol-XL. -Patient placed back on IV Lopressor due to colonic ileus. -Was on Eliquis for anticoagulation now on IV heparin due to ileus. -IV heparin being transitioned to full dose Lovenox. -Likely resume Eliquis once ileus has resolved.   Acute metabolic encephalopathy. History of dementia. Acute right MCA stroke. atient noted to have a progressive worsening mentation early on in the hospitalization. -CT head done 06/24/2023 with acute/subacute cortical and subcortical right MCA territory infarct. -  MRI brain done consistent with acute infarct. -MRA brain done with no acute abnormalities. -Carotid Dopplers done with  no significant ICA stenosis. -EEG done negative for seizures. -Repeat head CT done on 07/01/2023 per neurology recommendations showed no hemorrhagic conversion and as such neurology recommended resumption of anticoagulation with Eliquis. -Patient being followed by SLP was on dysphagia 1 diet however currently on full liquids due to colonic ileus. -PT/OT following and recommending SNF placement.   History of Seizures. -EEG done negative for seizures/epileptiform activity. -Depakote on hold due to hepatic function. -Was on Keppra which is currently on hold but will resume today -Seizure Precautions   Moderate MR. -Continue to Monitor volume status.  Dysphagia.  Due to stroke. -Secondary to acute CVA. -Being followed by SLP, underwent MBS was on a dysphagia 1 diet and subsequently downgraded to clear liquids due to colonic ileus and now has been advanced to a full liquid diet.   -Once ileus resolved will resume dysphagia 1 diet. -SLP following.  Electrolyte abnormalities including Severe Hypokalemia/Hypocalcemia/Hypophosphatemia/Hypomagnesemia -Electrolyte Trend: Recent Labs  Lab 07/11/23 0219 07/12/23 0154 07/12/23 0926 07/12/23 1340 07/13/23 0319 07/13/23 1410 07/14/23 0313  K 2.3* 2.3* 2.6* 2.5* 2.4* 2.5* 2.3*  CALCIUM 7.5* 7.3* 7.6* 7.9* 7.6* 7.8* 7.5*  PHOS 2.1* 2.7  --   --  2.1*  --  2.6  MG  --  1.9  --   --  1.8  --  1.8  -Replete -Continue to Monitor and Replete as Necessary    Hypernatremia, improved  -Likely secondary to hypovolemic hypernatremia secondary to dehydration. -Na+ Trend: Recent Labs  Lab 07/11/23 0219 07/12/23 0154 07/12/23 0926 07/12/23 1340 07/13/23 0319 07/13/23 1410 07/14/23 0313  NA 135 134* 137 137 138 139 137  -Improved with hydration and change of IV fluids to D5W. -Was on D5W which was changed to half-normal saline, and subsequently changed to normal saline.  -Continue D5 normal saline with 40 mEq of potassium. -Repeat labs in the  AM.   Possible thrush and  Possible pill induced gastritis. -Patient completed course of antibiotics. -Was on Diflucan through 07/08/2023. -Status post completion of nystatin swish and swallow.  History of syphilis. -RPR reactive for 2 years. -Dr. Allena Katz discussed with ID and patient currently being treated with Bicillin on a weekly basis.  Received a dose 07/09/2023.   AKI on CKD 3B Non Anion Gap Metabolic Acidosis -Baseline creatinine approximately 1.3 and noted to have worsening to 1.99.  -BUN/Cr Trend: Recent Labs  Lab 07/11/23 0219 07/12/23 0154 07/12/23 0926 07/12/23 1340 07/13/23 0319 07/13/23 1410 07/14/23 0313  BUN 7* 7* 7* 6* 5* 5* <5*  CREATININE 1.18 1.14 1.16 1.21 1.18 1.19 1.12  -C/w IVF as above and Bicarbonate gtt discontinued as Acidosis had improved -CO2 is now 21, anion gap is 6, chloride level was 110 -Avoid Nephrotoxic Medications, Contrast Dyes, Hypotension and Dehydration to Ensure Adequate Renal Perfusion and will need to Renally Adjust Meds -Continue to Monitor and Trend Renal Function carefully and repeat CMP in the AM   CAD -Patient not on DAPT or antiplatelets secondary to being on Eliquis. -Currently on IV heparin due to ileus and will transition to Lovenox.  -Outpatient follow-up with cardiology.   Bipolar Disorder. -Not on any medications prior to admission. -Stable.  Normocytic Anemia -Hgb/Hct Trend: Recent Labs  Lab 07/08/23 0548 07/09/23 0300 07/10/23 0322 07/11/23 0219 07/12/23 0154 07/13/23 0319 07/14/23 0313  HGB 11.6* 10.2* 10.4* 10.3* 10.0* 9.3* 8.7*  HCT 37.3* 31.0* 31.7* 31.1*  31.1* 28.9* 26.4*  MCV 92.8 89.9 88.8 88.1 90.4 90.0 89.8  -Check Anemia Panel in the AM -Continue to Monitor for S/Sx of Bleeding; No overt bleeding noted -Repeat CBC in the AM   Type II DM, uncontrolled with hyperglycemia. -Currently on sliding scale insulin. -CBG Trend: Recent Labs  Lab 07/13/23 1716 07/13/23 1921 07/13/23 2332  07/14/23 0355 07/14/23 0730 07/14/23 1209 07/14/23 1623  GLUCAP 120* 147* 104* 105* 82 90 125*   Poor p.o. intake. -Likely multifactorial secondary to acute CVA and colonic ileus. -Was initially placed on Marinol which is currently on hold due to ileus. -Tolerating full liquid diet.  Goals of care conversation. -Dr. Allena Katz discussed with legal guardian on the phone. -Patient noted with poor prognosis due to large right MCA infarct with deficits and dysphagia with dysarthria. -Patient noted with poor oral intake and now with colonic ileus currently under conservative treatment. -It is felt with acute CVA patient likely will not return to his prior status prior to admission. -It is felt that the patient has a cardiac arrest prognosis remains very poor to return to his prior status prior to arrest. -Patient with poor quality of life and Dr. Allena Katz recommended legal guardian to consider DNR for patient. -Palliative care following.  Hypoalbuminemia -Patient's Albumin Trend: Recent Labs  Lab 07/03/23 0615 07/08/23 0548 07/10/23 0322 07/11/23 0219 07/12/23 0154 07/13/23 0319 07/14/23 0313  ALBUMIN 2.6* 2.5* 2.3* 2.4* 2.2* 2.4* 3.3*  -Continue to Monitor and Trend and repeat CMP in the AM    DVT prophylaxis: SCDs Start: 06/15/23 1459    Code Status: Limited: Do not attempt resuscitation (DNR) -DNR-LIMITED -Do Not Intubate/DNI  Family Communication: Bedside  Disposition Plan:  Level of care: Telemetry Status is: Inpatient Remains inpatient appropriate because: He will go to SNF once his ileus is resolved and he is clinically improved and tolerating oral diet with repletion of his electrolytes   Consultants:  General surgery: Dr. Derrell Lolling 06/18/2023 Gastroenterology: Dr. Ewing Schlein 07/05/2023 Cardiology: Dr. Royann Shivers 06/23/2023 Nephrology: Dr. Ronalee Belts 06/24/2023 Neurology: Dr.Bhagat 06/24/2023 Palliative care: Dr. Patterson Hammersmith 06/25/2023  Procedures:  CT abdomen and pelvis 07/05/2023 CT  head 06/15/2023, 06/24/2023, 07/01/2023 Chest x-ray 06/15/2023, 06/17/2023 Modified barium swallow 06/19/2023 Abdominal films 07/05/2023, 07/06/2023, 07/08/2023, 07/09/2023, 07/10/2023, 07/12/2023 MRI brain 06/25/2023 MRA head 06/25/2023 Right upper quadrant ultrasound 06/22/2023 2D echo 06/16/2023 Carotid Dopplers 06/25/2023 CT chest abdomen and pelvis 06/17/2023 EEG 06/25/2023 Irrigation and debridement perirectal abscess per general surgery: Dr. Derrell Lolling 06/18/2023 PICC line placement 07/08/2023  Antimicrobials:  Anti-infectives (From admission, onward)    Start     Dose/Rate Route Frequency Ordered Stop   07/05/23 1600  fluconazole (DIFLUCAN) IVPB 200 mg        200 mg 100 mL/hr over 60 Minutes Intravenous Every 24 hours 07/05/23 1320 07/08/23 1658   07/02/23 1000  fluconazole (DIFLUCAN) tablet 100 mg  Status:  Discontinued        100 mg Oral Daily 07/02/23 0802 07/05/23 1320   07/02/23 0800  penicillin g benzathine (BICILLIN LA) 1200000 UNIT/2ML injection 2.4 Million Units        2.4 Million Units Intramuscular Weekly 07/01/23 1209 07/09/23 0950   06/26/23 1500  penicillin g benzathine (BICILLIN LA) 1200000 UNIT/2ML injection 2.4 Million Units        2.4 Million Units Intramuscular  Once 06/26/23 1351 06/28/23 0018   06/24/23 2200  amoxicillin-clavulanate (AUGMENTIN) 500-125 MG per tablet 1 tablet  Status:  Discontinued        1 tablet Oral Every  12 hours 06/24/23 1450 07/03/23 1202   06/24/23 2200  amoxicillin-clavulanate (AUGMENTIN) 500-125 MG per tablet 1 tablet  Status:  Discontinued        1 tablet Oral 2 times daily 06/24/23 1451 06/24/23 1451   06/21/23 1215  doxycycline (VIBRA-TABS) tablet 100 mg  Status:  Discontinued        100 mg Oral Every 12 hours 06/21/23 1128 07/03/23 1202   06/21/23 1215  amoxicillin-clavulanate (AUGMENTIN) 875-125 MG per tablet 1 tablet  Status:  Discontinued        1 tablet Oral Every 12 hours 06/21/23 1128 06/24/23 1450   06/20/23 1200  vancomycin  (VANCOREADY) IVPB 1500 mg/300 mL  Status:  Discontinued        1,500 mg 150 mL/hr over 120 Minutes Intravenous Every 36 hours 06/20/23 0719 06/21/23 1128   06/19/23 0000  vancomycin (VANCOREADY) IVPB 1250 mg/250 mL  Status:  Discontinued        1,250 mg 166.7 mL/hr over 90 Minutes Intravenous Every 36 hours 06/18/23 0939 06/20/23 0719   06/18/23 1800  ceFEPIme (MAXIPIME) 2 g in sodium chloride 0.9 % 100 mL IVPB  Status:  Discontinued        2 g 200 mL/hr over 30 Minutes Intravenous Every 12 hours 06/18/23 0939 06/21/23 1128   06/18/23 0600  ceFEPIme (MAXIPIME) 2 g in sodium chloride 0.9 % 100 mL IVPB  Status:  Discontinued        2 g 200 mL/hr over 30 Minutes Intravenous Every 24 hours 06/17/23 0920 06/18/23 0939   06/17/23 1400  vancomycin (VANCOCIN) IVPB 1000 mg/200 mL premix  Status:  Discontinued        1,000 mg 200 mL/hr over 60 Minutes Intravenous Every 36 hours 06/17/23 0920 06/18/23 0939   06/16/23 1800  vancomycin (VANCOREADY) IVPB 750 mg/150 mL  Status:  Discontinued        750 mg 150 mL/hr over 60 Minutes Intravenous Every 24 hours 06/15/23 1639 06/17/23 0920   06/16/23 1100  cefTRIAXone (ROCEPHIN) 2 g in sodium chloride 0.9 % 100 mL IVPB  Status:  Discontinued        2 g 200 mL/hr over 30 Minutes Intravenous Every 24 hours 06/15/23 1453 06/15/23 1455   06/15/23 1800  ceFEPIme (MAXIPIME) 2 g in sodium chloride 0.9 % 100 mL IVPB  Status:  Discontinued        2 g 200 mL/hr over 30 Minutes Intravenous Every 12 hours 06/15/23 1558 06/17/23 0920   06/15/23 1700  metroNIDAZOLE (FLAGYL) IVPB 500 mg  Status:  Discontinued        500 mg 100 mL/hr over 60 Minutes Intravenous Every 12 hours 06/15/23 1455 06/21/23 1128   06/15/23 1630  vancomycin (VANCOREADY) IVPB 1500 mg/300 mL        1,500 mg 150 mL/hr over 120 Minutes Intravenous  Once 06/15/23 1558 06/15/23 2058   06/15/23 1045  cefTRIAXone (ROCEPHIN) 2 g in sodium chloride 0.9 % 100 mL IVPB        2 g 200 mL/hr over 30 Minutes  Intravenous  Once 06/15/23 1041 06/15/23 1155       Subjective: Seen and examined at bedside he is doing okay.  Denied complaints of pain.  Nursing was just cleaned him up after he had a large bowel movement..  Feels okay.  No other concerns or comments..  Objective: Vitals:   07/13/23 2354 07/14/23 0500 07/14/23 0503 07/14/23 1258  BP: (!) 140/73 125/76 119/76 127/81  Pulse: Marland Kitchen)  59 76 84 95  Resp: 18  16 18   Temp: 98.8 F (37.1 C) 98.7 F (37.1 C) 97.9 F (36.6 C) 98.7 F (37.1 C)  TempSrc:  Axillary Oral Oral  SpO2: 99% 100% 100% 100%  Weight:  86.6 kg    Height:        Intake/Output Summary (Last 24 hours) at 07/14/2023 1944 Last data filed at 07/14/2023 1800 Gross per 24 hour  Intake 1042.82 ml  Output 1100 ml  Net -57.18 ml   Filed Weights   07/12/23 0450 07/13/23 0500 07/14/23 0500  Weight: 84.2 kg 83.9 kg 86.6 kg   Examination: Physical Exam:  Constitutional: Chronically ill-appearing African-American male in no acute distress Respiratory: Diminished to auscultation bilaterally, no wheezing, rales, rhonchi or crackles. Normal respiratory effort and patient is not tachypenic. No accessory muscle use.  Unlabored breathing Cardiovascular: RRR, 2 out of 6 systolic murmur. S1 and S2 auscultated. No extremity edema.   Abdomen: Soft, non-tender, distended secondary to body habitus bowel sounds positive.  GU: Deferred. Musculoskeletal: No clubbing / cyanosis of digits/nails. No joint deformity upper and lower extremities.  Neurologic: Has some left-sided facial weakness with decreased strength Psychiatric: Fair judgment and insight.  Awake and alert  Data Reviewed: I have personally reviewed following labs and imaging studies  CBC: Recent Labs  Lab 07/10/23 0322 07/11/23 0219 07/12/23 0154 07/13/23 0319 07/14/23 0313  WBC 6.3 7.2 5.9 6.0 5.7  HGB 10.4* 10.3* 10.0* 9.3* 8.7*  HCT 31.7* 31.1* 31.1* 28.9* 26.4*  MCV 88.8 88.1 90.4 90.0 89.8  PLT 198 190 182  166 171   Basic Metabolic Panel: Recent Labs  Lab 07/09/23 0300 07/09/23 1806 07/10/23 0322 07/11/23 0219 07/12/23 0154 07/12/23 0926 07/12/23 1340 07/13/23 0319 07/13/23 1410 07/14/23 0313  NA 138   < > 134* 135 134* 137 137 138 139 137  K 2.3*   < > 2.9* 2.3* 2.3* 2.6* 2.5* 2.4* 2.5* 2.3*  CL 112*   < > 103 103 105 107 107 110 110 110  CO2 20*   < > 25 24 24 22 22  21* 21* 21*  GLUCOSE 123*   < > 143* 99 88 109* 180* 113* 112* 106*  BUN 11   < > 6* 7* 7* 7* 6* 5* 5* <5*  CREATININE 1.24   < > 1.20 1.18 1.14 1.16 1.21 1.18 1.19 1.12  CALCIUM 6.8*   < > 7.1* 7.5* 7.3* 7.6* 7.9* 7.6* 7.8* 7.5*  MG 1.9  --  2.0  --  1.9  --   --  1.8  --  1.8  PHOS  --   --  1.4* 2.1* 2.7  --   --  2.1*  --  2.6   < > = values in this interval not displayed.   GFR: Estimated Creatinine Clearance: 55 mL/min (by C-G formula based on SCr of 1.12 mg/dL). Liver Function Tests: Recent Labs  Lab 07/08/23 0548 07/10/23 0322 07/11/23 0219 07/12/23 0154 07/13/23 0319 07/14/23 0313  AST 25  --   --   --   --   --   ALT 24  --   --   --   --   --   ALKPHOS 35*  --   --   --   --   --   BILITOT 0.6  --   --   --   --   --   PROT 6.3*  --   --   --   --   --  ALBUMIN 2.5* 2.3* 2.4* 2.2* 2.4* 3.3*   No results for input(s): "LIPASE", "AMYLASE" in the last 168 hours. No results for input(s): "AMMONIA" in the last 168 hours. Coagulation Profile: No results for input(s): "INR", "PROTIME" in the last 168 hours. Cardiac Enzymes: No results for input(s): "CKTOTAL", "CKMB", "CKMBINDEX", "TROPONINI" in the last 168 hours. BNP (last 3 results) No results for input(s): "PROBNP" in the last 8760 hours. HbA1C: No results for input(s): "HGBA1C" in the last 72 hours. CBG: Recent Labs  Lab 07/13/23 2332 07/14/23 0355 07/14/23 0730 07/14/23 1209 07/14/23 1623  GLUCAP 104* 105* 82 90 125*   Lipid Profile: No results for input(s): "CHOL", "HDL", "LDLCALC", "TRIG", "CHOLHDL", "LDLDIRECT" in the last 72  hours. Thyroid Function Tests: No results for input(s): "TSH", "T4TOTAL", "FREET4", "T3FREE", "THYROIDAB" in the last 72 hours. Anemia Panel: No results for input(s): "VITAMINB12", "FOLATE", "FERRITIN", "TIBC", "IRON", "RETICCTPCT" in the last 72 hours. Sepsis Labs: No results for input(s): "PROCALCITON", "LATICACIDVEN" in the last 168 hours.  No results found for this or any previous visit (from the past 240 hour(s)).   Radiology Studies: DG Abd Portable 1V  Result Date: 07/14/2023 CLINICAL DATA:  Ileus. EXAM: PORTABLE ABDOMEN - 1 VIEW COMPARISON:  July 13, 2023. FINDINGS: Significantly decreased bowel dilatation is noted compared to prior exam suggesting improving ileus or obstruction. Phleboliths are noted in the pelvis. IMPRESSION: Significantly decreased bowel dilatation as noted above. Electronically Signed   By: Lupita Raider M.D.   On: 07/14/2023 12:25   DG Abd Portable 1V  Result Date: 07/13/2023 CLINICAL DATA:  Ileus. EXAM: PORTABLE ABDOMEN - 1 VIEW COMPARISON:  July 12, 2023. FINDINGS: Stable air-filled bowel dilatation is noted which most likely represents colon. No definite small bowel dilatation is noted. Maximum measured diameter of 9.5 cm is measured. Phleboliths are noted in the pelvis. IMPRESSION: Stable air-filled bowel dilatation is noted which most likely represents colon. It is uncertain if this represents ileus or obstruction. Electronically Signed   By: Lupita Raider M.D.   On: 07/13/2023 14:50    Scheduled Meds:  Chlorhexidine Gluconate Cloth  6 each Topical Daily   enoxaparin (LOVENOX) injection  80 mg Subcutaneous Q12H   insulin aspart  0-15 Units Subcutaneous Q4H   insulin glargine-yfgn  8 Units Subcutaneous QHS   levETIRAcetam  500 mg Oral BID   magic mouthwash  10 mL Oral QID   metoCLOPramide (REGLAN) injection  5 mg Intravenous Q12H   metoprolol tartrate  5 mg Intravenous Q6H   mouth rinse  15 mL Mouth Rinse 4 times per day   pantoprazole  (PROTONIX) IV  40 mg Intravenous Q24H   polyethylene glycol  17 g Oral Daily   potassium chloride  40 mEq Oral BID   simethicone  40 mg Oral QID   sodium chloride flush  3 mL Intravenous Q12H   Continuous Infusions:   LOS: 29 days   Marguerita Merles, DO [facility] Available via Epic secure chat 7am-7pm After these hours, please refer to coverage provider listed on [email or web address] 07/14/2023, 7:44 PM

## 2023-07-14 NOTE — Progress Notes (Signed)
Physical Therapy Treatment Patient Details Name: Eric Zamora MRN: [id number] DOB: September 09, 1945 Today's Date: 07/14/2023   History of Present Illness Pt is 77 yo admitted with severe sepsis due to perirectal abscess.  Pt to OR on 10/18 for drainage of abscess.  Pt with increased lethargy on 10/24 , MRI Head performed and pt found to have acute infarct  right frontal operculum with moderate associated cytotoxic edema and petechial hemorrhage. Pt found to have ileus 07/06/23 and with new onset afib during admisiion. PMH: bipolar disorder, dementia, history of NSTEMI, hypertrophic cardiomyopathy    PT Comments  Pt lethargic but arousable and agreeable to therapy.  HR 90's - 110's rest; up to 140's activity but slowly recovering back to 110's rest (pt in afib).  His potassium lower today but has received meds per RN.  He required mod x 2 for transfers and was able to stand with mod x 2 and take a couple steps toward [facility].  He does still have tendency to lean posteriorly or R but improved from prior sessions and improved with treatment today.  Will continue to progress as able. Patient will benefit from continued inpatient follow up therapy, <3 hours/day at d/c    If plan is discharge home, recommend the following: Two people to help with walking and/or transfers;Two people to help with bathing/dressing/bathroom;Assistance with cooking/housework;Direct supervision/assist for financial management;Assist for transportation;Help with stairs or ramp for entrance   Can travel by private vehicle     No  Equipment Recommendations  None recommended by PT    Recommendations for Other Services       Precautions / Restrictions Precautions Precautions: Fall Precaution Comments: monitor HR     Mobility  Bed Mobility Overal bed mobility: Needs Assistance Bed Mobility: Supine to Sit, Sit to Supine     Supine to sit: Mod assist, +2 for physical assistance Sit to supine: Max assist, +2 for physical  assistance   General bed mobility comments: Increased time and cues.  Pt did assist with legs and reaching to lift trunk but needs mod x 2 to complete sitting.    Transfers Overall transfer level: Needs assistance Equipment used: Rolling walker (2 wheels) Transfers: Sit to/from Stand Sit to Stand: Mod assist, +2 physical assistance           General transfer comment: Increased time to rise with mod a x2 to RW    Ambulation/Gait Ambulation/Gait assistance: Mod assist, +2 physical assistance Gait Distance (Feet): 2 Feet Assistive device: Rolling walker (2 wheels) Gait Pattern/deviations: Step-to pattern Gait velocity: decreased     General Gait Details: Side steps at EOB with RW and mod x2; assist to move RW and verbal/tactile cues for feet; leans to R   Stairs             Wheelchair Mobility     Tilt Bed    Modified Rankin (Stroke Patients Only) Modified Rankin (Stroke Patients Only) Pre-Morbid Rankin Score: Moderate disability Modified Rankin: Severe disability     Balance Overall balance assessment: Needs assistance Sitting-balance support: Feet supported, Bilateral upper extremity supported Sitting balance-Leahy Scale: Poor Sitting balance - Comments: Needs bil UE support.  Started with heavy posterior lean requiring mod A.  Had pt lean forward onto recliner arm rest with bil UE and then able to progress back to hands on bed with close supervision.  With fatigue/challenges does begin to lean posterior requiring cues and repeated reach back to recliner.     Standing balance-Leahy Scale: Poor  Standing balance comment: Stood with RW and mod A of 2 , tending to lean R                            Cognition Arousal: Lethargic Behavior During Therapy: WFL for tasks assessed/performed Overall Cognitive Status: No family/caregiver present to determine baseline cognitive functioning                                 General Comments:  alert, answers simple questions, slurred speach, follows commands with increased time and cuing        Exercises      General Comments        Pertinent Vitals/Pain Pain Assessment Pain Assessment: No/denies pain    Home Living                          Prior Function            PT Goals (current goals can now be found in the care plan section) Progress towards PT goals: Progressing toward goals    Frequency    Min 1X/week      PT Plan      Co-evaluation              AM-PAC PT "6 Clicks" Mobility   Outcome Measure  Help needed turning from your back to your side while in a flat bed without using bedrails?: A Lot Help needed moving from lying on your back to sitting on the side of a flat bed without using bedrails?: Total Help needed moving to and from a bed to a chair (including a wheelchair)?: Total Help needed standing up from a chair using your arms (e.g., wheelchair or bedside chair)?: Total Help needed to walk in hospital room?: Total Help needed climbing 3-5 steps with a railing? : Total 6 Click Score: 7    End of Session Equipment Utilized During Treatment: Gait belt Activity Tolerance: Patient tolerated treatment well Patient left: in bed;with call bell/phone within reach;with bed alarm set (heels floated, R sidelying) Nurse Communication: Mobility status;Need for lift equipment PT Visit Diagnosis: Other abnormalities of gait and mobility (R26.89);Muscle weakness (generalized) (M62.81)     Time: 0102-7253 PT Time Calculation (min) (ACUTE ONLY): 23 min  Charges:    $Therapeutic Activity: 8-22 mins $Neuromuscular Re-education: 8-22 mins PT General Charges $$ ACUTE PT VISIT: 1 Visit                     Anise Salvo, PT Acute Rehab [facility] [phone]    Eric Zamora 07/14/2023, 3:52 PM

## 2023-07-14 NOTE — Plan of Care (Signed)

## 2023-07-14 NOTE — Consult Note (Signed)
WOC consulted for Stage 2 pressure injury. Bedside nursing indicated patient had a surgical procedure at the time of admission in this same region. After review of the chart patient has a perirectal abscess drained in our about this same region with a penrose drain. Based on today presentation the area is a healing surgical incision.   No further care needed.   Re consult if needed, will not follow at this time. Thanks  Aedon Deason [employer], RN,CWOCN, CNS, CWON-AP [phone])

## 2023-07-15 ENCOUNTER — Inpatient Hospital Stay (HOSPITAL_COMMUNITY): Payer: Medicare (Managed Care)

## 2023-07-15 DIAGNOSIS — K567 Ileus, unspecified: Secondary | ICD-10-CM

## 2023-07-15 DIAGNOSIS — I639 Cerebral infarction, unspecified: Secondary | ICD-10-CM

## 2023-07-15 DIAGNOSIS — I5032 Chronic diastolic (congestive) heart failure: Secondary | ICD-10-CM | POA: Diagnosis not present

## 2023-07-15 DIAGNOSIS — A419 Sepsis, unspecified organism: Secondary | ICD-10-CM | POA: Diagnosis not present

## 2023-07-15 DIAGNOSIS — I4891 Unspecified atrial fibrillation: Secondary | ICD-10-CM | POA: Diagnosis not present

## 2023-07-15 DIAGNOSIS — E870 Hyperosmolality and hypernatremia: Secondary | ICD-10-CM

## 2023-07-15 DIAGNOSIS — F319 Bipolar disorder, unspecified: Secondary | ICD-10-CM

## 2023-07-15 DIAGNOSIS — E1110 Type 2 diabetes mellitus with ketoacidosis without coma: Secondary | ICD-10-CM | POA: Diagnosis not present

## 2023-07-15 LAB — CBC WITH DIFFERENTIAL/PLATELET
Abs Immature Granulocytes: 0.01 10*3/uL (ref 0.00–0.07)
Basophils Absolute: 0 10*3/uL (ref 0.0–0.1)
Basophils Relative: 1 %
Eosinophils Absolute: 0.2 10*3/uL (ref 0.0–0.5)
Eosinophils Relative: 4 %
HCT: 27.6 % — ABNORMAL LOW (ref 39.0–52.0)
Hemoglobin: 8.9 g/dL — ABNORMAL LOW (ref 13.0–17.0)
Immature Granulocytes: 0 %
Lymphocytes Relative: 25 %
Lymphs Abs: 1.2 10*3/uL (ref 0.7–4.0)
MCH: 28.9 pg (ref 26.0–34.0)
MCHC: 32.2 g/dL (ref 30.0–36.0)
MCV: 89.6 fL (ref 80.0–100.0)
Monocytes Absolute: 0.6 10*3/uL (ref 0.1–1.0)
Monocytes Relative: 13 %
Neutro Abs: 2.8 10*3/uL (ref 1.7–7.7)
Neutrophils Relative %: 57 %
Platelets: 158 10*3/uL (ref 150–400)
RBC: 3.08 MIL/uL — ABNORMAL LOW (ref 4.22–5.81)
RDW: 14.9 % (ref 11.5–15.5)
WBC: 4.8 10*3/uL (ref 4.0–10.5)
nRBC: 0 % (ref 0.0–0.2)

## 2023-07-15 LAB — COMPREHENSIVE METABOLIC PANEL
ALT: 25 U/L (ref 0–44)
AST: 32 U/L (ref 15–41)
Albumin: 2.8 g/dL — ABNORMAL LOW (ref 3.5–5.0)
Alkaline Phosphatase: 27 U/L — ABNORMAL LOW (ref 38–126)
Anion gap: 7 (ref 5–15)
BUN: <5 mg/dL — ABNORMAL LOW (ref 8–23)
CO2: 20 mmol/L — ABNORMAL LOW (ref 22–32)
Calcium: 7.3 mg/dL — ABNORMAL LOW (ref 8.9–10.3)
Chloride: 111 mmol/L (ref 98–111)
Creatinine, Ser: 1.25 mg/dL — ABNORMAL HIGH (ref 0.61–1.24)
GFR, Estimated: 59 mL/min — ABNORMAL LOW (ref >60)
Glucose, Bld: 97 mg/dL (ref 70–99)
Potassium: 2.4 mmol/L — CL (ref 3.5–5.1)
Sodium: 138 mmol/L (ref 135–145)
Total Bilirubin: 0.6 mg/dL (ref <1.2)
Total Protein: 5.7 g/dL — ABNORMAL LOW (ref 6.5–8.1)

## 2023-07-15 LAB — GLUCOSE, CAPILLARY
Glucose-Capillary: 116 mg/dL — ABNORMAL HIGH (ref 70–99)
Glucose-Capillary: 120 mg/dL — ABNORMAL HIGH (ref 70–99)
Glucose-Capillary: 138 mg/dL — ABNORMAL HIGH (ref 70–99)
Glucose-Capillary: 148 mg/dL — ABNORMAL HIGH (ref 70–99)
Glucose-Capillary: 71 mg/dL (ref 70–99)
Glucose-Capillary: 92 mg/dL (ref 70–99)

## 2023-07-15 LAB — MAGNESIUM: Magnesium: 2.2 mg/dL (ref 1.7–2.4)

## 2023-07-15 LAB — PHOSPHORUS: Phosphorus: 1.9 mg/dL — ABNORMAL LOW (ref 2.5–4.6)

## 2023-07-15 MED ORDER — POTASSIUM CHLORIDE 10 MEQ/100ML IV SOLN
10.0000 meq | INTRAVENOUS | Status: AC
  Administered 2023-07-15 (×4): 10 meq via INTRAVENOUS
  Filled 2023-07-15 (×4): qty 100

## 2023-07-15 MED ORDER — POTASSIUM CHLORIDE CRYS ER 20 MEQ PO TBCR
40.0000 meq | EXTENDED_RELEASE_TABLET | Freq: Two times a day (BID) | ORAL | Status: AC
  Administered 2023-07-15 (×2): 40 meq via ORAL
  Filled 2023-07-15 (×2): qty 2

## 2023-07-15 MED ORDER — POTASSIUM PHOSPHATES 15 MMOLE/5ML IV SOLN
30.0000 mmol | Freq: Once | INTRAVENOUS | Status: AC
  Administered 2023-07-15: 30 mmol via INTRAVENOUS
  Filled 2023-07-15: qty 10

## 2023-07-15 NOTE — TOC Progression Note (Signed)
Transition of Care [facility]) - Progression Note    Patient Details  Name: Eric Zamora MRN: [id number] Date of Birth: November 25, 1945  [facility] CM/SW Contact  Otelia Santee, LCSW Phone Number: 07/15/2023, 9:17 AM  Clinical Narrative:    Pt planned to discharge to [facility] for SNF at discharge. Currently awaiting for ileus to improve.    Expected Discharge Plan: Skilled Nursing Facility Barriers to Discharge: SNF Pending bed offer, Inadequate or no insurance  Expected Discharge Plan and Services     Post Acute Care Choice: Skilled Nursing Facility Living arrangements for the past 2 months: Assisted Living Facility [facility])                                       Social Determinants of Health (SDOH) Interventions SDOH Screenings   Food Insecurity: Patient Unable To Answer (06/15/2023)  Housing: Low Risk  (06/15/2023)  Transportation Needs: Patient Unable To Answer (06/15/2023)  Utilities: Patient Unable To Answer (06/15/2023)  Tobacco Use: Low Risk  (07/05/2023)    Readmission Risk Interventions    06/25/2023    2:17 PM  Readmission Risk Prevention Plan  Transportation Screening Complete  PCP or Specialist Appt within 5-7 Days Complete  Home Care Screening Complete  Medication Review (RN CM) Complete

## 2023-07-15 NOTE — Plan of Care (Signed)
  Problem: Education: Goal: Ability to describe self-care measures that may prevent or decrease complications (Diabetes Survival Skills Education) will improve Outcome: Progressing Goal: Individualized Educational Video(s) Outcome: Progressing   Problem: Education: Goal: Ability to describe self-care measures that may prevent or decrease complications (Diabetes Survival Skills Education) will improve Outcome: Progressing Goal: Individualized Educational Video(s) Outcome: Progressing

## 2023-07-15 NOTE — Progress Notes (Signed)
SLP Cancellation Note  Patient Details Name: Eric Zamora MRN: [id number] DOB: 12/31/1945   Cancelled treatment:       Reason Eval/Treat Not Completed: Patient declined, no reason specified;ST will continue efforts. Recommend f/u at SNF with SLP.   Pat Siaosi Alter,M.S.,CCC-SLP 07/15/2023, 11:42 AM

## 2023-07-15 NOTE — Progress Notes (Signed)
PROGRESS NOTE    Eric Zamora  ZOX:[id number] DOB: 15-Oct-1945 DOA: 06/15/2023 PCP: Uvaldo Bristle, PA-C   Brief Narrative:  Brief hospital course: Mr. Hailu is a 77 y.o. 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, CAD, dCHF, HTN, hx DVT no longer on [facility], and HLD who admitted on 10/15 for severe sepsis due to perirectal abscess. 10/15 admitted to stepdown unit. 10/17: Overnight with fever, aspiration event, new AG acidosis, transferred back to SDU for BiPAP and insulin drip 10/18: taken to OR for drainage of abscess 10/23 cardiology was consulted for new onset A-fib with RVR. 10/24 nephrology was consulted for AKI and hyponatremia.  CT head was obtained for ongoing AMS and was found to have acute/subacute right MCA territory infarct.  Neurology was also consulted. 10/25.  Palliative care was consulted. 10/31.  Anticoagulation started again. 11/1.  Second dose of Bicillin given.  Underwent MBS. 11/2.  Marinol started for poor p.o. intake. 11/4.  Found to have ileus.  GI consulted.  Recommended DNR to legal guardian. 11/5.  Two-physician DNR form was signed and sent to guardian.  Will await decision.  ** Patient continues to have severe hypokalemia and so we will obtain a KUB which shows improving ileus. Will continue to replete electrolytes.   Assessment and Plan:  Severe sepsis due to perirectal abscess present on admission. -Patient seen in consultation by general surgery and underwent I&D on 06/18/2023. -Penrose drain placed and later removed.  No cultures were sent. -Patient noted to remain on IV vancomycin, Flagyl, Rocephin subsequently transitioned to cefepime and has been transition to Augmentin and doxycycline and recommending a total of 2 weeks of antibiotic treatment from initial I&D. -Antibiotic course completed. -Supportive care. -WOC nurse consulted and there is concern for a stage II pressure injury but this is  the area where the patient had a repair rectal abscess that was drained and about the same region with a Penrose drain and on today's presentation it appeared to be a healing surgical incision  Colonic ileus, improving  -Noted on x-ray 07/05/2023 and subsequently confirmed on CT scans. -Patient seen in consultation by GI who recommended conservative measures for now with repletion of electrolytes to keep potassium approximately 4, magnesium approximately 2. -Patient with some clinical improvement, abdomen less distended, softer, diffusely nontender to palpation with large watery stools. -MiraLAX has been decreased to daily.   -Decrease Reglan to every 12 hours and if no continued improvement with colonic ileus could consider discontinuation of Reglan.  -Full liquid diet going back to Dysphagia 1 Diet  -IV fluids now stopped  -Rectal tube in place. -Palliative care consulted and DNR recommended to legal guardian. -Frequent turns. -May need to reengage GI if no continued clinical improvement; Continues to have quite a bit of diarrhea -Repeat KUB yesterday showed "Significantly decreased bowel dilatation as noted above" and now KUB done and showed "Mild gas distention of a redundant sigmoid loop in the central abdomen. No evidence of bowel obstruction."   New onset A-fib with RVR/HOCM -Patient during the hospitalization to have new onset A-fib with RVR with CHADS2Vasc score of 4. -Patient seen in consultation by cardiology was on metoprolol 100 mg 4 times daily subsequently changed to Toprol-XL. -Patient placed back on IV Lopressor due to colonic ileus. -Was on Eliquis for anticoagulation now on IV heparin due to ileus. -IV heparin has abeen transitioned to full dose Lovenox. -Likely resume Eliquis once ileus has resolved.  Acute Metabolic Encephalopathy. History of dementia. Acute right MCA stroke. atient noted to have a progressive worsening mentation early on in the hospitalization. -CT  head done 06/24/2023 with acute/subacute cortical and subcortical right MCA territory infarct. -MRI brain done consistent with acute infarct. -MRA brain done with no acute abnormalities. -Carotid Dopplers done with no significant ICA stenosis. -EEG done negative for seizures. -Repeat head CT done on 07/01/2023 per neurology recommendations showed no hemorrhagic conversion and as such neurology recommended resumption of anticoagulation with Eliquis. -Patient being followed by SLP was on dysphagia 1 diet however had to be placed on full liquids due to colonic ileus but will go back to Dysphagia 1 Diet -PT/OT following and recommending SNF placement.   History of Seizures. -EEG done negative for seizures/epileptiform activity. -Depakote on hold due to hepatic function. -Was on Keppra which is currently on hold but will resume today -Seizure Precautions   Moderate MR. -Continue to Monitor volume status.  Dysphagia.  Due to stroke. -Secondary to acute CVA. -Being followed by SLP, underwent MBS was on a dysphagia 1 diet and subsequently downgraded to clear liquids due to colonic ileus and now has been advanced to a full liquid diet.   -Once ileus resolved will resume dysphagia 1 diet. -SLP following.  Electrolyte abnormalities including Severe Hypokalemia/Hypocalcemia/Hypophosphatemia/Hypomagnesemia -Electrolyte Trend: Recent Labs  Lab 07/12/23 0154 07/12/23 0926 07/12/23 1340 07/13/23 0319 07/13/23 1410 07/14/23 0313 07/15/23 0327  K 2.3* 2.6* 2.5* 2.4* 2.5* 2.3* 2.4*  CALCIUM 7.3* 7.6* 7.9* 7.6* 7.8* 7.5* 7.3*  PHOS 2.7  --   --  2.1*  --  2.6 1.9*  MG 1.9  --   --  1.8  --  1.8 2.2  -Replete with IV K Phos 30 mmol, IV KCL 40 mEQ and po KCL 40 mEQ BID -Continue to Monitor and Replete as Necessary    Hypernatremia, improved  -Likely secondary to hypovolemic hypernatremia secondary to dehydration. -Na+ Trend: Recent Labs  Lab 07/12/23 0154 07/12/23 0926 07/12/23 1340  07/13/23 0319 07/13/23 1410 07/14/23 0313 07/15/23 0327  NA 134* 137 137 138 139 137 138  -Improved with hydration and change of IV fluids to D5W. -Was on D5W which was changed to half-normal saline, and subsequently changed to normal saline.  -Continue D5 normal saline with 40 mEq of potassium. -Repeat labs in the AM.   Possible thrush and  Possible pill induced gastritis. -Patient completed course of antibiotics. -Was on Diflucan through 07/08/2023. -Status post completion of nystatin swish and swallow.  History of syphilis. -RPR reactive for 2 years. -Dr. Allena Katz discussed with ID and patient currently being treated with Bicillin on a weekly basis.  Received a dose 07/09/2023.   AKI on CKD 3B Non Anion Gap Metabolic Acidosis -Baseline creatinine approximately 1.3 and noted to have worsening to 1.99.  -BUN/Cr Trend: Recent Labs  Lab 07/12/23 0154 07/12/23 0926 07/12/23 1340 07/13/23 0319 07/13/23 1410 07/14/23 0313 07/15/23 0327  BUN 7* 7* 6* 5* 5* <5* <5*  CREATININE 1.14 1.16 1.21 1.18 1.19 1.12 1.25*  -Bicarbonate gtt discontinued as Acidosis now shows a CO2 is now 20, anion gap is 7, chloride level was 111 -Avoid Nephrotoxic Medications, Contrast Dyes, Hypotension and Dehydration to Ensure Adequate Renal Perfusion and will need to Renally Adjust Meds -Continue to Monitor and Trend Renal Function carefully and repeat CMP in the AM   CAD -Patient not on DAPT or antiplatelets secondary to being on Eliquis. -Was on IV heparin due to ileus but transitioned to  Lovenox.  -Outpatient follow-up with cardiology.   Bipolar Disorder. -Not on any medications prior to admission. -Stable.  Normocytic Anemia -Hgb/Hct Trend: Recent Labs  Lab 07/09/23 0300 07/10/23 0322 07/11/23 0219 07/12/23 0154 07/13/23 0319 07/14/23 0313 07/15/23 0327  HGB 10.2* 10.4* 10.3* 10.0* 9.3* 8.7* 8.9*  HCT 31.0* 31.7* 31.1* 31.1* 28.9* 26.4* 27.6*  MCV 89.9 88.8 88.1 90.4 90.0 89.8 89.6   -Check Anemia Panel in the AM -Continue to Monitor for S/Sx of Bleeding; No overt bleeding noted -Repeat CBC in the AM   Type II DM, uncontrolled with hyperglycemia. -Currently on sliding scale insulin. -CBG Trend: Recent Labs  Lab 07/14/23 1623 07/14/23 2130 07/15/23 0001 07/15/23 0410 07/15/23 0720 07/15/23 1213 07/15/23 1644  GLUCAP 125* 82 120* 92 116* 148* 71   Poor p.o. intake. -Likely multifactorial secondary to acute CVA and colonic ileus. -Was initially placed on Marinol which is currently on hold due to ileus. -Tolerating full liquid diet and will go back to Dysphagia 1 Diet   Goals of care conversation. -Dr. Allena Katz discussed with legal guardian on the phone. -Patient noted with poor prognosis due to large right MCA infarct with deficits and dysphagia with dysarthria. -Patient noted with poor oral intake and now with colonic ileus currently under conservative treatment. -It is felt with acute CVA patient likely will not return to his prior status prior to admission. -It is felt that the patient has a cardiac arrest prognosis remains very poor to return to his prior status prior to arrest. -Patient with poor quality of life and Dr. Allena Katz recommended legal guardian to consider DNR for patient. -Palliative care following.  Hypoalbuminemia -Patient's Albumin Trend: Recent Labs  Lab 07/08/23 0548 07/10/23 0322 07/11/23 0219 07/12/23 0154 07/13/23 0319 07/14/23 0313 07/15/23 0327  ALBUMIN 2.5* 2.3* 2.4* 2.2* 2.4* 3.3* 2.8*  -Continue to Monitor and Trend and repeat CMP in the AM  DVT prophylaxis: SCDs Start: 06/15/23 1459; Enoxaparin sq     Code Status: Limited: Do not attempt resuscitation (DNR) -DNR-LIMITED -Do Not Intubate/DNI  Family Communication: No family present at bedside   Disposition Plan:  Level of care: Telemetry Status is: Inpatient Remains inpatient appropriate because: Needs further clinical improvement and repletion of electrolytes prior to  Transfer to SNF   Consultants:  General surgery: Dr. Derrell Lolling 06/18/2023 Gastroenterology: Dr. Ewing Schlein 07/05/2023 Cardiology: Dr. Royann Shivers 06/23/2023 Nephrology: Dr. Ronalee Belts 06/24/2023 Neurology: Dr.Bhagat 06/24/2023 Palliative care: Dr. Patterson Hammersmith 06/25/2023  Procedures:  CT abdomen and pelvis 07/05/2023 CT head 06/15/2023, 06/24/2023, 07/01/2023 Chest x-ray 06/15/2023, 06/17/2023 Modified barium swallow 06/19/2023 Abdominal films 07/05/2023, 07/06/2023, 07/08/2023, 07/09/2023, 07/10/2023, 07/12/2023 MRI brain 06/25/2023 MRA head 06/25/2023 Right upper quadrant ultrasound 06/22/2023 2D echo 06/16/2023 Carotid Dopplers 06/25/2023 CT chest abdomen and pelvis 06/17/2023 EEG 06/25/2023 Irrigation and debridement perirectal abscess per general surgery: Dr. Derrell Lolling 06/18/2023 PICC line placement 07/08/2023  Antimicrobials:  Anti-infectives (From admission, onward)    Start     Dose/Rate Route Frequency Ordered Stop   07/05/23 1600  fluconazole (DIFLUCAN) IVPB 200 mg        200 mg 100 mL/hr over 60 Minutes Intravenous Every 24 hours 07/05/23 1320 07/08/23 1658   07/02/23 1000  fluconazole (DIFLUCAN) tablet 100 mg  Status:  Discontinued        100 mg Oral Daily 07/02/23 0802 07/05/23 1320   07/02/23 0800  penicillin g benzathine (BICILLIN LA) 1200000 UNIT/2ML injection 2.4 Million Units        2.4 Million Units Intramuscular Weekly 07/01/23 1209 07/09/23 0950  06/26/23 1500  penicillin g benzathine (BICILLIN LA) 1200000 UNIT/2ML injection 2.4 Million Units        2.4 Million Units Intramuscular  Once 06/26/23 1351 06/28/23 0018   06/24/23 2200  amoxicillin-clavulanate (AUGMENTIN) 500-125 MG per tablet 1 tablet  Status:  Discontinued        1 tablet Oral Every 12 hours 06/24/23 1450 07/03/23 1202   06/24/23 2200  amoxicillin-clavulanate (AUGMENTIN) 500-125 MG per tablet 1 tablet  Status:  Discontinued        1 tablet Oral 2 times daily 06/24/23 1451 06/24/23 1451   06/21/23 1215  doxycycline  (VIBRA-TABS) tablet 100 mg  Status:  Discontinued        100 mg Oral Every 12 hours 06/21/23 1128 07/03/23 1202   06/21/23 1215  amoxicillin-clavulanate (AUGMENTIN) 875-125 MG per tablet 1 tablet  Status:  Discontinued        1 tablet Oral Every 12 hours 06/21/23 1128 06/24/23 1450   06/20/23 1200  vancomycin (VANCOREADY) IVPB 1500 mg/300 mL  Status:  Discontinued        1,500 mg 150 mL/hr over 120 Minutes Intravenous Every 36 hours 06/20/23 0719 06/21/23 1128   06/19/23 0000  vancomycin (VANCOREADY) IVPB 1250 mg/250 mL  Status:  Discontinued        1,250 mg 166.7 mL/hr over 90 Minutes Intravenous Every 36 hours 06/18/23 0939 06/20/23 0719   06/18/23 1800  ceFEPIme (MAXIPIME) 2 g in sodium chloride 0.9 % 100 mL IVPB  Status:  Discontinued        2 g 200 mL/hr over 30 Minutes Intravenous Every 12 hours 06/18/23 0939 06/21/23 1128   06/18/23 0600  ceFEPIme (MAXIPIME) 2 g in sodium chloride 0.9 % 100 mL IVPB  Status:  Discontinued        2 g 200 mL/hr over 30 Minutes Intravenous Every 24 hours 06/17/23 0920 06/18/23 0939   06/17/23 1400  vancomycin (VANCOCIN) IVPB 1000 mg/200 mL premix  Status:  Discontinued        1,000 mg 200 mL/hr over 60 Minutes Intravenous Every 36 hours 06/17/23 0920 06/18/23 0939   06/16/23 1800  vancomycin (VANCOREADY) IVPB 750 mg/150 mL  Status:  Discontinued        750 mg 150 mL/hr over 60 Minutes Intravenous Every 24 hours 06/15/23 1639 06/17/23 0920   06/16/23 1100  cefTRIAXone (ROCEPHIN) 2 g in sodium chloride 0.9 % 100 mL IVPB  Status:  Discontinued        2 g 200 mL/hr over 30 Minutes Intravenous Every 24 hours 06/15/23 1453 06/15/23 1455   06/15/23 1800  ceFEPIme (MAXIPIME) 2 g in sodium chloride 0.9 % 100 mL IVPB  Status:  Discontinued        2 g 200 mL/hr over 30 Minutes Intravenous Every 12 hours 06/15/23 1558 06/17/23 0920   06/15/23 1700  metroNIDAZOLE (FLAGYL) IVPB 500 mg  Status:  Discontinued        500 mg 100 mL/hr over 60 Minutes Intravenous Every  12 hours 06/15/23 1455 06/21/23 1128   06/15/23 1630  vancomycin (VANCOREADY) IVPB 1500 mg/300 mL        1,500 mg 150 mL/hr over 120 Minutes Intravenous  Once 06/15/23 1558 06/15/23 2058   06/15/23 1045  cefTRIAXone (ROCEPHIN) 2 g in sodium chloride 0.9 % 100 mL IVPB        2 g 200 mL/hr over 30 Minutes Intravenous  Once 06/15/23 1041 06/15/23 1155       Subjective:  Seen and examined at bedside and thinks he is doing okay.  Says that he continues to have some diarrhea.  No nausea or vomiting.  No lightheadedness or dizziness.  No other concerns or complaints this time.  Objective: Vitals:   07/14/23 1947 07/15/23 0500 07/15/23 0528 07/15/23 1212  BP: 113/62  139/63 112/69  Pulse: 99  91 82  Resp: 17  20 17   Temp: 98.5 F (36.9 C)  99 F (37.2 C) 99.8 F (37.7 C)  TempSrc: Oral  Oral   SpO2: 97%  100% 100%  Weight:  85.3 kg    Height:        Intake/Output Summary (Last 24 hours) at 07/15/2023 1857 Last data filed at 07/15/2023 1800 Gross per 24 hour  Intake 215 ml  Output 2500 ml  Net -2285 ml   Filed Weights   07/13/23 0500 07/14/23 0500 07/15/23 0500  Weight: 83.9 kg 86.6 kg 85.3 kg   Examination: Physical Exam:  Constitutional: Chronically ill-appearing African-American male in no acute distress Respiratory: Diminished to auscultation bilaterally, no wheezing, rales, rhonchi or crackles. Normal respiratory effort and patient is not tachypenic. No accessory muscle use.  Unlabored breathing Cardiovascular: RRR, has a 2 out of 6 systolic murmur. S1 and S2 auscultated. No extremity edema.  Abdomen: Soft, non-tender, distended secondary to body habitus. Bowel sounds positive.  GU: Deferred. Musculoskeletal: No clubbing / cyanosis of digits/nails. No joint deformity upper and lower extremities. Neurologic: Has some left-sided facial weakness and decreased strength Psychiatric: He is awake and alert  Data Reviewed: I have personally reviewed following labs and imaging  studies  CBC: Recent Labs  Lab 07/11/23 0219 07/12/23 0154 07/13/23 0319 07/14/23 0313 07/15/23 0327  WBC 7.2 5.9 6.0 5.7 4.8  NEUTROABS  --   --   --   --  2.8  HGB 10.3* 10.0* 9.3* 8.7* 8.9*  HCT 31.1* 31.1* 28.9* 26.4* 27.6*  MCV 88.1 90.4 90.0 89.8 89.6  PLT 190 182 166 171 158   Basic Metabolic Panel: Recent Labs  Lab 07/10/23 0322 07/11/23 0219 07/12/23 0154 07/12/23 0926 07/12/23 1340 07/13/23 0319 07/13/23 1410 07/14/23 0313 07/15/23 0327  NA 134* 135 134*   < > 137 138 139 137 138  K 2.9* 2.3* 2.3*   < > 2.5* 2.4* 2.5* 2.3* 2.4*  CL 103 103 105   < > 107 110 110 110 111  CO2 25 24 24    < > 22 21* 21* 21* 20*  GLUCOSE 143* 99 88   < > 180* 113* 112* 106* 97  BUN 6* 7* 7*   < > 6* 5* 5* <5* <5*  CREATININE 1.20 1.18 1.14   < > 1.21 1.18 1.19 1.12 1.25*  CALCIUM 7.1* 7.5* 7.3*   < > 7.9* 7.6* 7.8* 7.5* 7.3*  MG 2.0  --  1.9  --   --  1.8  --  1.8 2.2  PHOS 1.4* 2.1* 2.7  --   --  2.1*  --  2.6 1.9*   < > = values in this interval not displayed.   GFR: Estimated Creatinine Clearance: 48.9 mL/min (A) (by C-G formula based on SCr of 1.25 mg/dL (H)). Liver Function Tests: Recent Labs  Lab 07/11/23 0219 07/12/23 0154 07/13/23 0319 07/14/23 0313 07/15/23 0327  AST  --   --   --   --  32  ALT  --   --   --   --  25  ALKPHOS  --   --   --   --  27*  BILITOT  --   --   --   --  0.6  PROT  --   --   --   --  5.7*  ALBUMIN 2.4* 2.2* 2.4* 3.3* 2.8*   No results for input(s): "LIPASE", "AMYLASE" in the last 168 hours. No results for input(s): "AMMONIA" in the last 168 hours. Coagulation Profile: No results for input(s): "INR", "PROTIME" in the last 168 hours. Cardiac Enzymes: No results for input(s): "CKTOTAL", "CKMB", "CKMBINDEX", "TROPONINI" in the last 168 hours. BNP (last 3 results) No results for input(s): "PROBNP" in the last 8760 hours. HbA1C: No results for input(s): "HGBA1C" in the last 72 hours. CBG: Recent Labs  Lab 07/15/23 0001  07/15/23 0410 07/15/23 0720 07/15/23 1213 07/15/23 1644  GLUCAP 120* 92 116* 148* 71   Lipid Profile: No results for input(s): "CHOL", "HDL", "LDLCALC", "TRIG", "CHOLHDL", "LDLDIRECT" in the last 72 hours. Thyroid Function Tests: No results for input(s): "TSH", "T4TOTAL", "FREET4", "T3FREE", "THYROIDAB" in the last 72 hours. Anemia Panel: No results for input(s): "VITAMINB12", "FOLATE", "FERRITIN", "TIBC", "IRON", "RETICCTPCT" in the last 72 hours. Sepsis Labs: No results for input(s): "PROCALCITON", "LATICACIDVEN" in the last 168 hours.  No results found for this or any previous visit (from the past 240 hour(s)).   Radiology Studies: DG Abd 1 View  Result Date: 07/15/2023 CLINICAL DATA:  Follow-up for ileus. EXAM: ABDOMEN - 1 VIEW COMPARISON:  07/14/2023 film. FINDINGS: 4:44 a.m. There is mild gas distention again noted of a redundant sigmoid loop in the central abdomen. Rest of the aerated bowel appears normal in caliber. There is no supine evidence of free air. IMPRESSION: Mild gas distention of a redundant sigmoid loop in the central abdomen. No evidence of bowel obstruction. Electronically Signed   By: Almira Bar M.D.   On: 07/15/2023 07:07   DG Abd Portable 1V  Result Date: 07/14/2023 CLINICAL DATA:  Ileus. EXAM: PORTABLE ABDOMEN - 1 VIEW COMPARISON:  July 13, 2023. FINDINGS: Significantly decreased bowel dilatation is noted compared to prior exam suggesting improving ileus or obstruction. Phleboliths are noted in the pelvis. IMPRESSION: Significantly decreased bowel dilatation as noted above. Electronically Signed   By: Lupita Raider M.D.   On: 07/14/2023 12:25    Scheduled Meds:  Chlorhexidine Gluconate Cloth  6 each Topical Daily   enoxaparin (LOVENOX) injection  80 mg Subcutaneous Q12H   insulin aspart  0-15 Units Subcutaneous Q4H   insulin glargine-yfgn  8 Units Subcutaneous QHS   levETIRAcetam  500 mg Oral BID   magic mouthwash  10 mL Oral QID    metoCLOPramide (REGLAN) injection  5 mg Intravenous Q12H   metoprolol tartrate  5 mg Intravenous Q6H   mouth rinse  15 mL Mouth Rinse 4 times per day   pantoprazole (PROTONIX) IV  40 mg Intravenous Q24H   polyethylene glycol  17 g Oral Daily   potassium chloride  40 mEq Oral BID   simethicone  40 mg Oral QID   sodium chloride flush  3 mL Intravenous Q12H   Continuous Infusions:   LOS: 30 days   Marguerita Merles, DO [facility] Available via Epic secure chat 7am-7pm After these hours, please refer to coverage provider listed on [email or web address] 07/15/2023, 6:57 PM

## 2023-07-15 NOTE — Plan of Care (Signed)
  Problem: Coping: Goal: Ability to adjust to condition or change in health will improve Outcome: Progressing   Problem: Fluid Volume: Goal: Ability to maintain a balanced intake and output will improve Outcome: Progressing   Problem: Metabolic: Goal: Ability to maintain appropriate glucose levels will improve Outcome: Progressing   Problem: Nutritional: Goal: Maintenance of adequate nutrition will improve Outcome: Progressing   Problem: Skin Integrity: Goal: Risk for impaired skin integrity will decrease Outcome: Progressing   Problem: Tissue Perfusion: Goal: Adequacy of tissue perfusion will improve Outcome: Progressing   Problem: Clinical Measurements: Goal: Cardiovascular complication will be avoided Outcome: Progressing   Problem: Nutrition: Goal: Adequate nutrition will be maintained Outcome: Progressing   Problem: Coping: Goal: Level of anxiety will decrease Outcome: Progressing   Problem: Elimination: Goal: Will not experience complications related to bowel motility Outcome: Progressing Goal: Will not experience complications related to urinary retention Outcome: Progressing   Problem: Pain Managment: Goal: General experience of comfort will improve Outcome: Progressing   Problem: Safety: Goal: Ability to remain free from injury will improve Outcome: Progressing   Problem: Cardiac: Goal: Ability to maintain an adequate cardiac output will improve Outcome: Progressing   Problem: Nutritional: Goal: Maintenance of adequate nutrition will improve Outcome: Progressing

## 2023-07-16 ENCOUNTER — Inpatient Hospital Stay (HOSPITAL_COMMUNITY): Payer: Medicare (Managed Care)

## 2023-07-16 DIAGNOSIS — E870 Hyperosmolality and hypernatremia: Secondary | ICD-10-CM

## 2023-07-16 DIAGNOSIS — E1110 Type 2 diabetes mellitus with ketoacidosis without coma: Secondary | ICD-10-CM | POA: Diagnosis not present

## 2023-07-16 DIAGNOSIS — A419 Sepsis, unspecified organism: Secondary | ICD-10-CM | POA: Diagnosis not present

## 2023-07-16 DIAGNOSIS — I2510 Atherosclerotic heart disease of native coronary artery without angina pectoris: Secondary | ICD-10-CM | POA: Diagnosis not present

## 2023-07-16 DIAGNOSIS — I639 Cerebral infarction, unspecified: Secondary | ICD-10-CM

## 2023-07-16 DIAGNOSIS — R1310 Dysphagia, unspecified: Secondary | ICD-10-CM | POA: Diagnosis not present

## 2023-07-16 LAB — IRON AND TIBC
Iron: 37 ug/dL — ABNORMAL LOW (ref 45–182)
Saturation Ratios: 30 % (ref 17.9–39.5)
TIBC: 125 ug/dL — ABNORMAL LOW (ref 250–450)
UIBC: 88 ug/dL

## 2023-07-16 LAB — CBC WITH DIFFERENTIAL/PLATELET
Abs Immature Granulocytes: 0.01 10*3/uL (ref 0.00–0.07)
Basophils Absolute: 0 10*3/uL (ref 0.0–0.1)
Basophils Relative: 1 %
Eosinophils Absolute: 0.2 10*3/uL (ref 0.0–0.5)
Eosinophils Relative: 4 %
HCT: 28.1 % — ABNORMAL LOW (ref 39.0–52.0)
Hemoglobin: 9.2 g/dL — ABNORMAL LOW (ref 13.0–17.0)
Immature Granulocytes: 0 %
Lymphocytes Relative: 27 %
Lymphs Abs: 1.2 10*3/uL (ref 0.7–4.0)
MCH: 29.4 pg (ref 26.0–34.0)
MCHC: 32.7 g/dL (ref 30.0–36.0)
MCV: 89.8 fL (ref 80.0–100.0)
Monocytes Absolute: 0.6 10*3/uL (ref 0.1–1.0)
Monocytes Relative: 13 %
Neutro Abs: 2.4 10*3/uL (ref 1.7–7.7)
Neutrophils Relative %: 55 %
Platelets: 190 10*3/uL (ref 150–400)
RBC: 3.13 MIL/uL — ABNORMAL LOW (ref 4.22–5.81)
RDW: 15.3 % (ref 11.5–15.5)
WBC: 4.4 10*3/uL (ref 4.0–10.5)
nRBC: 0 % (ref 0.0–0.2)

## 2023-07-16 LAB — RETICULOCYTES
Immature Retic Fract: 22.7 % — ABNORMAL HIGH (ref 2.3–15.9)
RBC.: 3.26 MIL/uL — ABNORMAL LOW (ref 4.22–5.81)
Retic Count, Absolute: 58 10*3/uL (ref 19.0–186.0)
Retic Ct Pct: 1.8 % (ref 0.4–3.1)

## 2023-07-16 LAB — COMPREHENSIVE METABOLIC PANEL
ALT: 23 U/L (ref 0–44)
AST: 27 U/L (ref 15–41)
Albumin: 2.5 g/dL — ABNORMAL LOW (ref 3.5–5.0)
Alkaline Phosphatase: 25 U/L — ABNORMAL LOW (ref 38–126)
Anion gap: 6 (ref 5–15)
BUN: <5 mg/dL — ABNORMAL LOW (ref 8–23)
CO2: 19 mmol/L — ABNORMAL LOW (ref 22–32)
Calcium: 7.4 mg/dL — ABNORMAL LOW (ref 8.9–10.3)
Chloride: 115 mmol/L — ABNORMAL HIGH (ref 98–111)
Creatinine, Ser: 1.34 mg/dL — ABNORMAL HIGH (ref 0.61–1.24)
GFR, Estimated: 54 mL/min — ABNORMAL LOW (ref >60)
Glucose, Bld: 101 mg/dL — ABNORMAL HIGH (ref 70–99)
Potassium: 2.7 mmol/L — CL (ref 3.5–5.1)
Sodium: 140 mmol/L (ref 135–145)
Total Bilirubin: 0.6 mg/dL (ref <1.2)
Total Protein: 5.8 g/dL — ABNORMAL LOW (ref 6.5–8.1)

## 2023-07-16 LAB — GLUCOSE, CAPILLARY
Glucose-Capillary: 110 mg/dL — ABNORMAL HIGH (ref 70–99)
Glucose-Capillary: 114 mg/dL — ABNORMAL HIGH (ref 70–99)
Glucose-Capillary: 117 mg/dL — ABNORMAL HIGH (ref 70–99)
Glucose-Capillary: 133 mg/dL — ABNORMAL HIGH (ref 70–99)
Glucose-Capillary: 142 mg/dL — ABNORMAL HIGH (ref 70–99)
Glucose-Capillary: 91 mg/dL (ref 70–99)
Glucose-Capillary: 94 mg/dL (ref 70–99)

## 2023-07-16 LAB — PHOSPHORUS: Phosphorus: 2.1 mg/dL — ABNORMAL LOW (ref 2.5–4.6)

## 2023-07-16 LAB — FERRITIN: Ferritin: 147 ng/mL (ref 24–336)

## 2023-07-16 LAB — FOLATE: Folate: 12 ng/mL (ref >5.9)

## 2023-07-16 LAB — MAGNESIUM: Magnesium: 2.1 mg/dL (ref 1.7–2.4)

## 2023-07-16 LAB — VITAMIN B12: Vitamin B-12: 569 pg/mL (ref 180–914)

## 2023-07-16 MED ORDER — PANTOPRAZOLE SODIUM 40 MG PO TBEC
40.0000 mg | DELAYED_RELEASE_TABLET | Freq: Every day | ORAL | Status: DC
  Administered 2023-07-16 – 2023-08-02 (×18): 40 mg via ORAL
  Filled 2023-07-16 (×18): qty 1

## 2023-07-16 MED ORDER — METOCLOPRAMIDE HCL 10 MG PO TABS
5.0000 mg | ORAL_TABLET | Freq: Two times a day (BID) | ORAL | Status: DC
  Administered 2023-07-16 (×2): 5 mg via ORAL
  Filled 2023-07-16 (×2): qty 1

## 2023-07-16 MED ORDER — NYSTATIN 100000 UNIT/ML MT SUSP
5.0000 mL | Freq: Four times a day (QID) | OROMUCOSAL | Status: DC
  Administered 2023-07-16 – 2023-07-29 (×50): 500000 [IU] via ORAL
  Filled 2023-07-16 (×50): qty 5

## 2023-07-16 MED ORDER — POTASSIUM CHLORIDE IN NACL 20-0.9 MEQ/L-% IV SOLN
INTRAVENOUS | Status: AC
  Filled 2023-07-16 (×2): qty 1000

## 2023-07-16 MED ORDER — SIMVASTATIN 20 MG PO TABS
20.0000 mg | ORAL_TABLET | Freq: Every day | ORAL | Status: DC
  Administered 2023-07-16 – 2023-08-01 (×17): 20 mg via ORAL
  Filled 2023-07-16 (×17): qty 1

## 2023-07-16 MED ORDER — PAROXETINE HCL 10 MG PO TABS
10.0000 mg | ORAL_TABLET | Freq: Every day | ORAL | Status: DC
  Administered 2023-07-16 – 2023-08-02 (×18): 10 mg via ORAL
  Filled 2023-07-16 (×18): qty 1

## 2023-07-16 MED ORDER — METOPROLOL TARTRATE 25 MG PO TABS
25.0000 mg | ORAL_TABLET | Freq: Two times a day (BID) | ORAL | Status: DC
  Administered 2023-07-16 – 2023-07-28 (×26): 25 mg via ORAL
  Filled 2023-07-16 (×27): qty 1

## 2023-07-16 MED ORDER — SODIUM BICARBONATE 650 MG PO TABS
650.0000 mg | ORAL_TABLET | Freq: Two times a day (BID) | ORAL | Status: DC
  Administered 2023-07-16 – 2023-07-29 (×26): 650 mg via ORAL
  Filled 2023-07-16 (×26): qty 1

## 2023-07-16 MED ORDER — FLUCONAZOLE 100 MG PO TABS
100.0000 mg | ORAL_TABLET | Freq: Every day | ORAL | Status: DC
  Administered 2023-07-16 – 2023-07-19 (×4): 100 mg via ORAL
  Filled 2023-07-16 (×4): qty 1

## 2023-07-16 MED ORDER — POTASSIUM PHOSPHATES 15 MMOLE/5ML IV SOLN
30.0000 mmol | Freq: Once | INTRAVENOUS | Status: AC
  Administered 2023-07-16: 30 mmol via INTRAVENOUS
  Filled 2023-07-16: qty 10

## 2023-07-16 MED ORDER — POTASSIUM CHLORIDE CRYS ER 20 MEQ PO TBCR
40.0000 meq | EXTENDED_RELEASE_TABLET | Freq: Two times a day (BID) | ORAL | Status: AC
  Administered 2023-07-16 (×2): 40 meq via ORAL
  Filled 2023-07-16 (×2): qty 2

## 2023-07-16 NOTE — Progress Notes (Signed)
Pharmacy Antibiotic and Anticoagulation Note  Eric Zamora is a 77 y.o. male who is known to pharmacy from current lovenox consult. Pharmacy has been consulted on 07/16/23 to dose fluconazole for Oropharangeal candidiasis.  - scr trending up, 1.34 (crcl~45)  Plan: - fluconazole 100mg  PO daily     Pt's currently on LMWH for afib.  Continue current dose of 80mg  q12h  for now.  - plts improved to 190K - hgb stable ___________________________________________  Height: 5\' 5"  (165.1 cm) Weight: 85.3 kg (188 lb 0.8 oz) IBW/kg (Calculated) : 61.5  Temp (24hrs), Avg:99.1 F (37.3 C), Min:98.5 F (36.9 C), Max:99.8 F (37.7 C)  Recent Labs  Lab 07/11/23 0219 07/12/23 0154 07/12/23 0926 07/13/23 0319 07/13/23 1410 07/14/23 0313 07/15/23 0327 07/16/23 0349  WBC 7.2 5.9  --  6.0  --  5.7 4.8  --   CREATININE 1.18 1.14   < > 1.18 1.19 1.12 1.25* 1.34*   < > = values in this interval not displayed.    Estimated Creatinine Clearance: 45.6 mL/min (A) (by C-G formula based on SCr of 1.34 mg/dL (H)).    No Known Allergies   Thank you for allowing pharmacy to be a part of this patient's care.  Lucia Gaskins 07/16/2023 11:47 AM

## 2023-07-16 NOTE — Progress Notes (Signed)
Physical Therapy Treatment Patient Details Name: Eric Zamora MRN: [id number] DOB: 09-21-1945 Today's Date: 07/16/2023   History of Present Illness Pt is 77 yo admitted with severe sepsis due to perirectal abscess.  Pt to OR on 10/18 for drainage of abscess.  Pt with increased lethargy on 10/24 , MRI Head performed and pt found to have acute infarct  right frontal operculum with moderate associated cytotoxic edema and petechial hemorrhage. Pt found to have ileus 07/06/23 and with new onset afib during admisiion. PMH: bipolar disorder, dementia, history of NSTEMI, hypertrophic cardiomyopathy    PT Comments  At arrival, pt rolling for cleaning with nursing.  He was then agreeable to therapy but required increased assist today and more difficulty maintaining balance.  Needed assist of 2 for all transfers. He was not able to take any steps today, potentially fatigued from recent transfers/cleaning.  HR 136 bpm max today.  Will cont to progress as able. Patient will benefit from continued inpatient follow up therapy, <3 hours/day at d/c.      If plan is discharge home, recommend the following: Two people to help with walking and/or transfers;Two people to help with bathing/dressing/bathroom;Assistance with cooking/housework;Direct supervision/assist for financial management;Assist for transportation;Help with stairs or ramp for entrance   Can travel by private vehicle     No  Equipment Recommendations  None recommended by PT    Recommendations for Other Services       Precautions / Restrictions Precautions Precautions: Fall Precaution Comments: monitor HR Restrictions Weight Bearing Restrictions: No     Mobility  Bed Mobility Overal bed mobility: Needs Assistance Bed Mobility: Supine to Sit, Sit to Supine Rolling: Total assist, +2 for physical assistance, +2 for safety/equipment   Supine to sit: Mod assist, +2 for physical assistance Sit to supine: Max assist, +2 for physical  assistance   General bed mobility comments: Increased time and cues. Assist provided at trunk and legs in order to transition into sitting, and increased assist max A of 2 to come into supine due to fatigue    Transfers Overall transfer level: Needs assistance Equipment used: Rolling walker (2 wheels) Transfers: Sit to/from Stand Sit to Stand: Mod assist, +2 physical assistance, Max assist, From elevated surface           General transfer comment: Increased time to rise with mod a x2 to RW 3x, assist to position feet prior to attempting, increased assist provided at on R side due to R lateral lean and patient with need for increased assist to attempt to keep legs shoulder width apart    Ambulation/Gait               General Gait Details: not able today   Stairs             Wheelchair Mobility     Tilt Bed    Modified Rankin (Stroke Patients Only) Modified Rankin (Stroke Patients Only) Pre-Morbid Rankin Score: Moderate disability Modified Rankin: Severe disability     Balance Overall balance assessment: Needs assistance Sitting-balance support: Feet supported, Bilateral upper extremity supported Sitting balance-Leahy Scale: Poor Sitting balance - Comments: Needs bil UE support.  Started with heavy posterior lean requiring mod A.  Had pt lean forward onto recliner arm rest with bil UE and then able to progress back to hands on bed with close supervision.  With fatigue/challenges does begin to lean posterior requiring cues and repeated reach back to recliner.     Standing balance-Leahy Scale: Poor Standing balance  comment: Stood with RW and mod A of 2 , tending to lean R                            Cognition Arousal: Alert Behavior During Therapy: WFL for tasks assessed/performed Overall Cognitive Status: No family/caregiver present to determine baseline cognitive functioning                                 General Comments:  alert, answers simple questions, slurred speach, follows commands with increased time and cuing        Exercises      General Comments General comments (skin integrity, edema, etc.): beginning of session assisting nursing with rolling for cleaning - leaking around rectal tube      Pertinent Vitals/Pain Pain Assessment Pain Assessment: Faces Faces Pain Scale: No hurt    Home Living                          Prior Function            PT Goals (current goals can now be found in the care plan section) Progress towards PT goals: Progressing toward goals    Frequency    Min 1X/week      PT Plan      Co-evaluation PT/OT/SLP Co-Evaluation/Treatment: Yes Reason for Co-Treatment: Complexity of the patient's impairments (multi-system involvement);For patient/therapist safety          AM-PAC PT "6 Clicks" Mobility   Outcome Measure  Help needed turning from your back to your side while in a flat bed without using bedrails?: A Lot Help needed moving from lying on your back to sitting on the side of a flat bed without using bedrails?: Total Help needed moving to and from a bed to a chair (including a wheelchair)?: Total Help needed standing up from a chair using your arms (e.g., wheelchair or bedside chair)?: Total Help needed to walk in hospital room?: Total Help needed climbing 3-5 steps with a railing? : Total 6 Click Score: 7    End of Session Equipment Utilized During Treatment: Gait belt Activity Tolerance: Patient tolerated treatment well Patient left: in bed;with call bell/phone within reach;with bed alarm set Nurse Communication: Mobility status;Need for lift equipment PT Visit Diagnosis: Other abnormalities of gait and mobility (R26.89);Muscle weakness (generalized) (M62.81)     Time: 0981-1914 PT Time Calculation (min) (ACUTE ONLY): 30 min  Charges:    $Neuromuscular Re-education: 8-22 mins PT General Charges $$ ACUTE PT VISIT: 1 Visit                      Anise Salvo, PT Acute Rehab [facility] [phone]    Rayetta Humphrey 07/16/2023, 3:43 PM

## 2023-07-16 NOTE — Progress Notes (Signed)
Occupational Therapy Treatment Patient Details Name: Eric Zamora MRN: [id number] DOB: August 18, 1946 Today's Date: 07/16/2023   History of present illness Pt is 77 yo admitted with severe sepsis due to perirectal abscess.  Pt to OR on 10/18 for drainage of abscess.  Pt with increased lethargy on 10/24 , MRI Head performed and pt found to have acute infarct  right frontal operculum with moderate associated cytotoxic edema and petechial hemorrhage. Pt found to have ileus 07/06/23 and with new onset afib during admisiion. PMH: bipolar disorder, dementia, history of NSTEMI, hypertrophic cardiomyopathy   OT comments  Session focus on increasing overall activity tolerance and functional mobility. Patient with loose stool and RN and NT providing bath and peri-care. OT and PT providing assist. Patient completing mulitiple bouts of rolling and then transitioning to EOB with mod A of 2. Patient working on sit<>stands with RW, needing increased assist, elevated bed to complete 3 repetitions. Noted to have increased right lateral lean, and significant assist to place feet shoulder width apart instead of together. Patient returned to bed at end of session. OT recommendation remains appropriate, will continue to follow.       If plan is discharge home, recommend the following:  Two people to help with walking and/or transfers;Two people to help with bathing/dressing/bathroom;Direct supervision/assist for medications management;Help with stairs or ramp for entrance;Supervision due to cognitive status;Assist for transportation   Equipment Recommendations  Other (comment) (defer to next venue)    Recommendations for Other Services      Precautions / Restrictions Precautions Precautions: Fall Precaution Comments: monitor HR Restrictions Weight Bearing Restrictions: No       Mobility Bed Mobility Overal bed mobility: Needs Assistance Bed Mobility: Supine to Sit, Sit to Supine Rolling: Total assist, +2  for physical assistance, +2 for safety/equipment   Supine to sit: Mod assist, +2 for physical assistance Sit to supine: Max assist, +2 for physical assistance   General bed mobility comments: Increased time and cues. Assist provided at trunk and legs in order to transition into sitting, and increased assist max A of 2 to come into supine due to fatigue    Transfers Overall transfer level: Needs assistance Equipment used: Rolling walker (2 wheels) Transfers: Sit to/from Stand Sit to Stand: Mod assist, +2 physical assistance, Max assist           General transfer comment: Increased time to rise with mod a x2 to RW 3x, increased assist provided at on R side due to R lateral lean and patient with need for increased assist to attempt to keep legs shoulder width apart     Balance Overall balance assessment: Needs assistance Sitting-balance support: Feet supported, Bilateral upper extremity supported Sitting balance-Leahy Scale: Poor Sitting balance - Comments: Needs bil UE support.  Started with heavy posterior lean requiring mod A.  Had pt lean forward onto recliner arm rest with bil UE and then able to progress back to hands on bed with close supervision.  With fatigue/challenges does begin to lean posterior requiring cues and repeated reach back to recliner. Postural control: Posterior lean, Right lateral lean Standing balance support: Bilateral upper extremity supported Standing balance-Leahy Scale: Poor Standing balance comment: Stood with RW and mod A of 2 , tending to lean R                           ADL either performed or assessed with clinical judgement   ADL Overall ADL's : Needs  assistance/impaired                         Toilet Transfer: Moderate assistance;+2 for physical assistance;+2 for safety/equipment   Toileting- Clothing Manipulation and Hygiene: Bed level;Total assistance;+2 for physical assistance;+2 for safety/equipment Toileting -  Clothing Manipulation Details (indicate cue type and reason): assisting RN and NT with peri-care     Functional mobility during ADLs: Moderate assistance;+2 for physical assistance;+2 for safety/equipment General ADL Comments: Session focus on increasing overall activity tolerance and functional mobility. Patient with loose stool and RN and NT providing bath and peri-care. OT and PT providing assist. Patient completing mulitiple bouts of rolling and then transitioning to EOB with mod A of 2. Patient working on sit<>stands with RW, needing increased assist, elevated bed to complete 3 repetitions. Noted to have increased right lateral lean, and significant assist to place feet shoulder width apart instead of together. Patient returned to bed at end of session. OT recommendation remains appropriate, will continue to follow.    Extremity/Trunk Assessment              Vision       Perception     Praxis      Cognition Arousal: Alert Behavior During Therapy: WFL for tasks assessed/performed Overall Cognitive Status: No family/caregiver present to determine baseline cognitive functioning                                 General Comments: alert, answers simple questions, slurred speach, follows commands with increased time and cuing        Exercises      Shoulder Instructions       General Comments      Pertinent Vitals/ Pain       Pain Assessment Pain Assessment: Faces Faces Pain Scale: No hurt  Home Living                                          Prior Functioning/Environment              Frequency  Min 1X/week        Progress Toward Goals  OT Goals(current goals can now be found in the care plan section)  Progress towards OT goals: Progressing toward goals  Acute Rehab OT Goals Patient Stated Goal: did not state OT Goal Formulation: Patient unable to participate in goal setting Time For Goal Achievement: 07/26/23 Potential  to Achieve Goals: Fair  Plan      Co-evaluation                 AM-PAC OT "6 Clicks" Daily Activity     Outcome Measure   Help from another person eating meals?: A Little Help from another person taking care of personal grooming?: A Lot Help from another person toileting, which includes using toliet, bedpan, or urinal?: Total Help from another person bathing (including washing, rinsing, drying)?: A Lot Help from another person to put on and taking off regular upper body clothing?: A Lot Help from another person to put on and taking off regular lower body clothing?: A Lot 6 Click Score: 12    End of Session Equipment Utilized During Treatment: Gait belt;Rolling walker (2 wheels)  OT Visit Diagnosis: Other abnormalities of gait and mobility (R26.89);Muscle weakness (generalized) (M62.81);Cognitive  communication deficit (R41.841)   Activity Tolerance Patient tolerated treatment well   Patient Left in bed;with call bell/phone within reach;with bed alarm set   Nurse Communication Mobility status        Time: 5366-4403 OT Time Calculation (min): 29 min  Charges: OT General Charges $OT Visit: 1 Visit OT Treatments $Self Care/Home Management : 8-22 mins  Pollyann Glen E. [place], OTR/L Acute Rehabilitation Services [phone]   Cherlyn Cushing 07/16/2023, 3:38 PM

## 2023-07-16 NOTE — Plan of Care (Signed)
Problem: Education: Goal: Ability to describe self-care measures that may prevent or decrease complications (Diabetes Survival Skills Education) will improve Outcome: Progressing Goal: Individualized Educational Video(s) Outcome: Progressing   Problem: Coping: Goal: Ability to adjust to condition or change in health will improve Outcome: Progressing   Problem: Fluid Volume: Goal: Ability to maintain a balanced intake and output will improve Outcome: Progressing   Problem: Health Behavior/Discharge Planning: Goal: Ability to identify and utilize available resources and services will improve Outcome: Progressing Goal: Ability to manage health-related needs will improve Outcome: Progressing   Problem: Metabolic: Goal: Ability to maintain appropriate glucose levels will improve Outcome: Progressing   Problem: Nutritional: Goal: Maintenance of adequate nutrition will improve Outcome: Progressing Goal: Progress toward achieving an optimal weight will improve Outcome: Progressing   Problem: Skin Integrity: Goal: Risk for impaired skin integrity will decrease Outcome: Progressing   Problem: Tissue Perfusion: Goal: Adequacy of tissue perfusion will improve Outcome: Progressing   Problem: Education: Goal: Knowledge of General Education information will improve Description: Including pain rating scale, medication(s)/side effects and non-pharmacologic comfort measures Outcome: Progressing   Problem: Health Behavior/Discharge Planning: Goal: Ability to manage health-related needs will improve Outcome: Progressing   Problem: Clinical Measurements: Goal: Ability to maintain clinical measurements within normal limits will improve Outcome: Progressing Goal: Will remain free from infection Outcome: Progressing Goal: Diagnostic test results will improve Outcome: Progressing Goal: Respiratory complications will improve Outcome: Progressing Goal: Cardiovascular complication will  be avoided Outcome: Progressing   Problem: Activity: Goal: Risk for activity intolerance will decrease Outcome: Progressing   Problem: Nutrition: Goal: Adequate nutrition will be maintained Outcome: Progressing   Problem: Coping: Goal: Level of anxiety will decrease Outcome: Progressing   Problem: Elimination: Goal: Will not experience complications related to bowel motility Outcome: Progressing Goal: Will not experience complications related to urinary retention Outcome: Progressing   Problem: Pain Managment: Goal: General experience of comfort will improve Outcome: Progressing   Problem: Safety: Goal: Ability to remain free from injury will improve Outcome: Progressing   Problem: Skin Integrity: Goal: Risk for impaired skin integrity will decrease Outcome: Progressing   Problem: Fluid Volume: Goal: Hemodynamic stability will improve Outcome: Progressing   Problem: Clinical Measurements: Goal: Diagnostic test results will improve Outcome: Progressing Goal: Signs and symptoms of infection will decrease Outcome: Progressing   Problem: Respiratory: Goal: Ability to maintain adequate ventilation will improve Outcome: Progressing   Problem: Education: Goal: Ability to describe self-care measures that may prevent or decrease complications (Diabetes Survival Skills Education) will improve Outcome: Progressing Goal: Individualized Educational Video(s) Outcome: Progressing   Problem: Cardiac: Goal: Ability to maintain an adequate cardiac output will improve Outcome: Progressing   Problem: Health Behavior/Discharge Planning: Goal: Ability to identify and utilize available resources and services will improve Outcome: Progressing Goal: Ability to manage health-related needs will improve Outcome: Progressing   Problem: Fluid Volume: Goal: Ability to achieve a balanced intake and output will improve Outcome: Progressing   Problem: Metabolic: Goal: Ability to  maintain appropriate glucose levels will improve Outcome: Progressing   Problem: Nutritional: Goal: Maintenance of adequate nutrition will improve Outcome: Progressing Goal: Maintenance of adequate weight for body size and type will improve Outcome: Progressing   Problem: Respiratory: Goal: Will regain and/or maintain adequate ventilation Outcome: Progressing   Problem: Urinary Elimination: Goal: Ability to achieve and maintain adequate renal perfusion and functioning will improve Outcome: Progressing   Problem: Education: Goal: Knowledge of disease or condition will improve Outcome: Progressing Goal: Knowledge of secondary prevention will improve (  MUST DOCUMENT ALL) Outcome: Progressing Goal: Knowledge of patient specific risk factors will improve Loraine Leriche N/A or DELETE if not current risk factor) Outcome: Progressing   Problem: Ischemic Stroke/TIA Tissue Perfusion: Goal: Complications of ischemic stroke/TIA will be minimized Outcome: Progressing

## 2023-07-16 NOTE — Progress Notes (Signed)
PROGRESS NOTE    Eric Zamora  WGN:[id number] DOB: 04-12-1946 DOA: 06/15/2023 PCP: Uvaldo Bristle, PA-C   Brief Narrative:  Brief hospital course: Eric Zamora is a 77 y.o. 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, CAD, dCHF, HTN, hx DVT no longer on [facility], and HLD who admitted on 10/15 for severe sepsis due to perirectal abscess. 10/15 admitted to stepdown unit. 10/17: Overnight with fever, aspiration event, new AG acidosis, transferred back to SDU for BiPAP and insulin drip 10/18: taken to OR for drainage of abscess 10/23 cardiology was consulted for new onset A-fib with RVR. 10/24 nephrology was consulted for AKI and hyponatremia.  CT head was obtained for ongoing AMS and was found to have acute/subacute right MCA territory infarct.  Neurology was also consulted. 10/25.  Palliative care was consulted. 10/31.  Anticoagulation started again. 11/1.  Second dose of Bicillin given.  Underwent MBS. 11/2.  Marinol started for poor p.o. intake. 11/4.  Found to have ileus.  GI consulted.  Recommended DNR to legal guardian. 11/5.  Two-physician DNR form was signed and sent to guardian.  Will await decision.  ** Patient continues to have severe hypokalemia and so we will obtain a KUB which shows improving ileus. Will continue to replete electrolytes now he has significant thrush again so we will restart treatment.   Assessment and Plan:  Severe sepsis due to perirectal abscess present on admission. -Patient seen in consultation by general surgery and underwent I&D on 06/18/2023. -Penrose drain placed and later removed.  No cultures were sent. -Patient noted to remain on IV vancomycin, Flagyl, Rocephin subsequently transitioned to cefepime and has been transition to Augmentin and doxycycline and recommending a total of 2 weeks of antibiotic treatment from initial I&D. -Antibiotic course completed. -Supportive care. -WOC nurse consulted  and there is concern for a stage II pressure injury but this is the area where the patient had a repair rectal abscess that was drained and about the same region with a Penrose drain and on today's presentation it appeared to be a healing surgical incision  Colonic ileus, improving  -Noted on x-ray 07/05/2023 and subsequently confirmed on CT scans. -Patient seen in consultation by GI who recommended conservative measures for now with repletion of electrolytes to keep potassium approximately 4, magnesium approximately 2. -Patient with some clinical improvement, abdomen less distended, softer, diffusely nontender to palpation with large watery stools. -MiraLAX has been decreased to daily.   -Decrease Reglan to every 12 hours and if no continued improvement with colonic ileus could consider discontinuation of Reglan.  -Full liquid diet going back to Dysphagia 1 Diet  -IV fluids now stopped  -Rectal tube in place. -Palliative care consulted and DNR recommended to legal guardian. -Frequent turns. -May need to reengage GI if no continued clinical improvement; Continues to have quite a bit of diarrhea -Repeat KUB the day before yesterday showed "Significantly decreased bowel dilatation as noted above" and now KUB done and showed "Mild gas distention of a redundant sigmoid loop in the central abdomen. No evidence of bowel obstruction." -Repeat KUB this Evening    New onset A-fib with RVR/HOCM -Patient during the hospitalization to have new onset A-fib with RVR with CHADS2Vasc Zamora of 4. -Patient seen in consultation by cardiology was on metoprolol 100 mg 4 times daily subsequently changed to Toprol-XL. -Patient placed back on IV Lopressor due to colonic ileus. -Was on Eliquis for anticoagulation now on IV heparin due  to ileus. -IV heparin has abeen transitioned to full dose Lovenox. -Likely resume Eliquis once ileus has fully resolved .   Acute Metabolic Encephalopathy. History of dementia. Acute  right MCA stroke. atient noted to have a progressive worsening mentation early on in the hospitalization. -CT head done 06/24/2023 with acute/subacute cortical and subcortical right MCA territory infarct. -MRI brain done consistent with acute infarct. -MRA brain done with no acute abnormalities. -Carotid Dopplers done with no significant ICA stenosis. -EEG done negative for seizures. -Repeat head CT done on 07/01/2023 per neurology recommendations showed no hemorrhagic conversion and as such neurology recommended resumption of anticoagulation with Eliquis. -Patient being followed by SLP was on dysphagia 1 diet however had to be placed on full liquids due to colonic ileus but will go back to Dysphagia 1 Diet -PT/OT following and recommending SNF placement.   History of Seizures. -EEG done negative for seizures/epileptiform activity. -Depakote on hold due to hepatic function. -Was on Keppra which is currently on hold but will resume today -Seizure Precautions   Moderate MR. -Continue to Monitor volume status.  Dysphagia.  Due to stroke. -Secondary to acute CVA. -Being followed by SLP, underwent MBS was on a dysphagia 1 diet and subsequently downgraded to clear liquids due to colonic ileus and now has been advanced to a full liquid diet.   -Once ileus resolved will resume dysphagia 1 diet. -SLP following.  Electrolyte abnormalities including Severe Hypokalemia/Hypocalcemia/Hypophosphatemia/Hypomagnesemia -Electrolyte Trend: Recent Labs  Lab 07/12/23 0926 07/12/23 1340 07/13/23 0319 07/13/23 1410 07/14/23 0313 07/15/23 0327 07/16/23 0349  K 2.6* 2.5* 2.4* 2.5* 2.3* 2.4* 2.7*  CALCIUM 7.6* 7.9* 7.6* 7.8* 7.5* 7.3* 7.4*  PHOS  --   --  2.1*  --  2.6 1.9* 2.1*  MG  --   --  1.8  --  1.8 2.2 2.1  -Replete with po KCL 40 mEQ BID x2 IV K Phos 30 mmol, and NS + 20 mEQ Kcl at 40 mL/hr -Continue to Monitor and Replete as Necessary    Hypernatremia, improved  -Likely secondary to  hypovolemic hypernatremia secondary to dehydration. -Na+ Trend: Recent Labs  Lab 07/12/23 0926 07/12/23 1340 07/13/23 0319 07/13/23 1410 07/14/23 0313 07/15/23 0327 07/16/23 0349  NA 137 137 138 139 137 138 140  -Improved with hydration and change of IV fluids to D5W. -Restarted IVF with NS + 20 mEQ Kcl at 40 mL/hr x1 Day -Repeat labs in the AM.   History of syphilis. -RPR reactive for 2 years. -Dr. Allena Katz discussed with ID and patient currently being treated with Bicillin on a weekly basis.  Received a dose 07/09/2023.   AKI on CKD 3B Non Anion Gap Metabolic Acidosis -BUN/Cr Trend: Recent Labs  Lab 07/12/23 0926 07/12/23 1340 07/13/23 0319 07/13/23 1410 07/14/23 0313 07/15/23 0327 07/16/23 0349  BUN 7* 6* 5* 5* <5* <5* <5*  CREATININE 1.16 1.21 1.18 1.19 1.12 1.25* 1.34*  -Bicarbonate gtt discontinued; Acidosis now shows a CO2 is now 19, anion gap is 6, chloride level was 115 -Will start sodium bicarb tabs 650 mg p.o. twice daily and resume IVF as above -Avoid Nephrotoxic Medications, Contrast Dyes, Hypotension and Dehydration to Ensure Adequate Renal Perfusion and will need to Renally Adjust Meds -Continue to Monitor and Trend Renal Function carefully and repeat CMP in the AM   CAD -Patient not on DAPT or antiplatelets secondary to being on Eliquis. -Was on IV heparin due to ileus but transitioned to Lovenox.  -Outpatient follow-up with cardiology.   Bipolar Disorder. -  Not on any medications prior to admission. -Stable.  Normocytic Anemia -Hgb/Hct Trend: Recent Labs  Lab 07/10/23 0322 07/11/23 0219 07/12/23 0154 07/13/23 0319 07/14/23 0313 07/15/23 0327 07/16/23 1213  HGB 10.4* 10.3* 10.0* 9.3* 8.7* 8.9* 9.2*  HCT 31.7* 31.1* 31.1* 28.9* 26.4* 27.6* 28.1*  MCV 88.8 88.1 90.4 90.0 89.8 89.6 89.8  -Checked Anemia Panel and showed an iron level of 37, UIBC of 88, TIBC of 125, saturation ratio 30%, ferritin level 147, folate level 12.0, vitamin B12  569 -Continue to Monitor for S/Sx of Bleeding; No overt bleeding noted -Repeat CBC in the AM   Type II DM, uncontrolled with hyperglycemia. -Currently on sliding scale insulin. -CBG Trend: Recent Labs  Lab 07/15/23 1644 07/15/23 2011 07/16/23 0016 07/16/23 0401 07/16/23 0754 07/16/23 1205 07/16/23 1636  GLUCAP 71 138* 110* 91 94 133* 114*   Poor p.o. intake. -Likely multifactorial secondary to acute CVA and colonic ileus. -Was initially placed on Marinol which is currently on hold due to ileus. -Tolerating full liquid diet and will go back to Dysphagia 1 Diet and tolerating this well  Thrush Possible pill induced gastritis. -Patient completed course of antibiotics. -Was on Diflucan through 07/08/2023 and Thrush worsened now. -Status post completion of nystatin swish and swallow but will resume -Nystatin and Fluconazole ordered again  Goals of care conversation. -Dr. Allena Katz discussed with legal guardian on the phone. -Patient noted with poor prognosis due to large right MCA infarct with deficits and dysphagia with dysarthria. -Patient noted with poor oral intake and now with colonic ileus currently under conservative treatment. -It is felt with acute CVA patient likely will not return to his prior status prior to admission. -It is felt that the patient has a cardiac arrest prognosis remains very poor to return to his prior status prior to arrest. -Patient with poor quality of life and Dr. Allena Katz recommended legal guardian to consider DNR for patient. -Palliative care following.  Hypoalbuminemia -Patient's Albumin Trend: Recent Labs  Lab 07/10/23 0322 07/11/23 0219 07/12/23 0154 07/13/23 0319 07/14/23 0313 07/15/23 0327 07/16/23 0349  ALBUMIN 2.3* 2.4* 2.2* 2.4* 3.3* 2.8* 2.5*  -Continue to Monitor and Trend and repeat CMP in the AM  Obesity -Complicates overall prognosis and care -Estimated body mass index is 31.29 kg/m as calculated from the following:   Height  as of this encounter: 5\' 5"  (1.651 m).   Weight as of this encounter: 85.3 kg.  -Weight Loss and Dietary Counseling given   DVT prophylaxis: SCDs Start: 06/15/23 1459    Code Status: Limited: Do not attempt resuscitation (DNR) -DNR-LIMITED -Do Not Intubate/DNI  Family Communication: No family currently at bedside  Disposition Plan:  Level of care: Telemetry Status is: Inpatient Remains inpatient appropriate because: Needs further clinical improvement in his ileus and full tolerance of his diet   Consultants:  General surgery: Dr. Derrell Lolling 06/18/2023 Gastroenterology: Dr. Ewing Schlein 07/05/2023 Cardiology: Dr. Royann Shivers 06/23/2023 Nephrology: Dr. Ronalee Belts 06/24/2023 Neurology: Dr.Bhagat 06/24/2023 Palliative care: Dr. Patterson Hammersmith 06/25/2023  Procedures:  CT abdomen and pelvis 07/05/2023 CT head 06/15/2023, 06/24/2023, 07/01/2023 Chest x-ray 06/15/2023, 06/17/2023 Modified barium swallow 06/19/2023 Abdominal films 07/05/2023, 07/06/2023, 07/08/2023, 07/09/2023, 07/10/2023, 07/12/2023 MRI brain 06/25/2023 MRA head 06/25/2023 Right upper quadrant ultrasound 06/22/2023 2D echo 06/16/2023 Carotid Dopplers 06/25/2023 CT chest abdomen and pelvis 06/17/2023 EEG 06/25/2023 Irrigation and debridement perirectal abscess per general surgery: Dr. Derrell Lolling 06/18/2023 PICC line placement 07/08/2023  Antimicrobials:  Anti-infectives (From admission, onward)    Start     Dose/Rate Route Frequency Ordered Stop  07/16/23 1300  fluconazole (DIFLUCAN) tablet 100 mg        100 mg Oral Daily 07/16/23 1154     07/05/23 1600  fluconazole (DIFLUCAN) IVPB 200 mg        200 mg 100 mL/hr over 60 Minutes Intravenous Every 24 hours 07/05/23 1320 07/08/23 1658   07/02/23 1000  fluconazole (DIFLUCAN) tablet 100 mg  Status:  Discontinued        100 mg Oral Daily 07/02/23 0802 07/05/23 1320   07/02/23 0800  penicillin g benzathine (BICILLIN LA) 1200000 UNIT/2ML injection 2.4 Million Units        2.4 Million Units  Intramuscular Weekly 07/01/23 1209 07/09/23 0950   06/26/23 1500  penicillin g benzathine (BICILLIN LA) 1200000 UNIT/2ML injection 2.4 Million Units        2.4 Million Units Intramuscular  Once 06/26/23 1351 06/28/23 0018   06/24/23 2200  amoxicillin-clavulanate (AUGMENTIN) 500-125 MG per tablet 1 tablet  Status:  Discontinued        1 tablet Oral Every 12 hours 06/24/23 1450 07/03/23 1202   06/24/23 2200  amoxicillin-clavulanate (AUGMENTIN) 500-125 MG per tablet 1 tablet  Status:  Discontinued        1 tablet Oral 2 times daily 06/24/23 1451 06/24/23 1451   06/21/23 1215  doxycycline (VIBRA-TABS) tablet 100 mg  Status:  Discontinued        100 mg Oral Every 12 hours 06/21/23 1128 07/03/23 1202   06/21/23 1215  amoxicillin-clavulanate (AUGMENTIN) 875-125 MG per tablet 1 tablet  Status:  Discontinued        1 tablet Oral Every 12 hours 06/21/23 1128 06/24/23 1450   06/20/23 1200  vancomycin (VANCOREADY) IVPB 1500 mg/300 mL  Status:  Discontinued        1,500 mg 150 mL/hr over 120 Minutes Intravenous Every 36 hours 06/20/23 0719 06/21/23 1128   06/19/23 0000  vancomycin (VANCOREADY) IVPB 1250 mg/250 mL  Status:  Discontinued        1,250 mg 166.7 mL/hr over 90 Minutes Intravenous Every 36 hours 06/18/23 0939 06/20/23 0719   06/18/23 1800  ceFEPIme (MAXIPIME) 2 g in sodium chloride 0.9 % 100 mL IVPB  Status:  Discontinued        2 g 200 mL/hr over 30 Minutes Intravenous Every 12 hours 06/18/23 0939 06/21/23 1128   06/18/23 0600  ceFEPIme (MAXIPIME) 2 g in sodium chloride 0.9 % 100 mL IVPB  Status:  Discontinued        2 g 200 mL/hr over 30 Minutes Intravenous Every 24 hours 06/17/23 0920 06/18/23 0939   06/17/23 1400  vancomycin (VANCOCIN) IVPB 1000 mg/200 mL premix  Status:  Discontinued        1,000 mg 200 mL/hr over 60 Minutes Intravenous Every 36 hours 06/17/23 0920 06/18/23 0939   06/16/23 1800  vancomycin (VANCOREADY) IVPB 750 mg/150 mL  Status:  Discontinued        750 mg 150 mL/hr  over 60 Minutes Intravenous Every 24 hours 06/15/23 1639 06/17/23 0920   06/16/23 1100  cefTRIAXone (ROCEPHIN) 2 g in sodium chloride 0.9 % 100 mL IVPB  Status:  Discontinued        2 g 200 mL/hr over 30 Minutes Intravenous Every 24 hours 06/15/23 1453 06/15/23 1455   06/15/23 1800  ceFEPIme (MAXIPIME) 2 g in sodium chloride 0.9 % 100 mL IVPB  Status:  Discontinued        2 g 200 mL/hr over 30 Minutes Intravenous Every 12  hours 06/15/23 1558 06/17/23 0920   06/15/23 1700  metroNIDAZOLE (FLAGYL) IVPB 500 mg  Status:  Discontinued        500 mg 100 mL/hr over 60 Minutes Intravenous Every 12 hours 06/15/23 1455 06/21/23 1128   06/15/23 1630  vancomycin (VANCOREADY) IVPB 1500 mg/300 mL        1,500 mg 150 mL/hr over 120 Minutes Intravenous  Once 06/15/23 1558 06/15/23 2058   06/15/23 1045  cefTRIAXone (ROCEPHIN) 2 g in sodium chloride 0.9 % 100 mL IVPB        2 g 200 mL/hr over 30 Minutes Intravenous  Once 06/15/23 1041 06/15/23 1155       Subjective: Seen and examined at bedside he is little bit more awake and alert.  Eating about 50 to 100% of his meals if she is properly fed.  No nausea or vomiting.  Continues to have some diarrhea but has a Flexi-Seal.  No nausea or vomiting.  Overall has no complaints and has been more interactive.  Objective: Vitals:   07/16/23 0500 07/16/23 0543 07/16/23 1014 07/16/23 1428  BP:  124/72 124/72 (!) 103/91  Pulse:  (!) 106 (!) 138 93  Resp:  18  17  Temp:  99.1 F (37.3 C)  98.4 F (36.9 C)  TempSrc:    Oral  SpO2:  98%  100%  Weight: 85.3 kg     Height:        Intake/Output Summary (Last 24 hours) at 07/16/2023 1759 Last data filed at 07/16/2023 1545 Gross per 24 hour  Intake 729.23 ml  Output 3200 ml  Net -2470.77 ml   Filed Weights   07/14/23 0500 07/15/23 0500 07/16/23 0500  Weight: 86.6 kg 85.3 kg 85.3 kg   Examination: Physical Exam:  Constitutional: Chronically ill-appearing African-American male in no acute distress but  appears a little bit more interactive Respiratory: Diminished to auscultation bilaterally, no wheezing, rales, rhonchi or crackles. Normal respiratory effort and patient is not tachypenic. No accessory muscle use.  Unlabored breathing Cardiovascular: RRR, no murmurs / rubs / gallops. S1 and S2 auscultated. No extremity edema  Abdomen: Soft, non-tender, distended secondary to body habitus. Bowel sounds positive.  GU: Deferred. Musculoskeletal: No clubbing / cyanosis of digits/nails. No joint deformity upper and lower extremities.  Neurologic: Continues to have some decrease strength and likely some left-sided weakness Psychiatric: He is awake and alert and more interactive today compared to yesterday  Data Reviewed: I have personally reviewed following labs and imaging studies  CBC: Recent Labs  Lab 07/12/23 0154 07/13/23 0319 07/14/23 0313 07/15/23 0327 07/16/23 1213  WBC 5.9 6.0 5.7 4.8 4.4  NEUTROABS  --   --   --  2.8 2.4  HGB 10.0* 9.3* 8.7* 8.9* 9.2*  HCT 31.1* 28.9* 26.4* 27.6* 28.1*  MCV 90.4 90.0 89.8 89.6 89.8  PLT 182 166 171 158 190   Basic Metabolic Panel: Recent Labs  Lab 07/12/23 0154 07/12/23 0926 07/13/23 0319 07/13/23 1410 07/14/23 0313 07/15/23 0327 07/16/23 0349  NA 134*   < > 138 139 137 138 140  K 2.3*   < > 2.4* 2.5* 2.3* 2.4* 2.7*  CL 105   < > 110 110 110 111 115*  CO2 24   < > 21* 21* 21* 20* 19*  GLUCOSE 88   < > 113* 112* 106* 97 101*  BUN 7*   < > 5* 5* <5* <5* <5*  CREATININE 1.14   < > 1.18 1.19 1.12 1.25*  1.34*  CALCIUM 7.3*   < > 7.6* 7.8* 7.5* 7.3* 7.4*  MG 1.9  --  1.8  --  1.8 2.2 2.1  PHOS 2.7  --  2.1*  --  2.6 1.9* 2.1*   < > = values in this interval not displayed.   GFR: Estimated Creatinine Clearance: 45.6 mL/min (A) (by C-G formula based on SCr of 1.34 mg/dL (H)). Liver Function Tests: Recent Labs  Lab 07/12/23 0154 07/13/23 0319 07/14/23 0313 07/15/23 0327 07/16/23 0349  AST  --   --   --  32 27  ALT  --   --   --   25 23  ALKPHOS  --   --   --  27* 25*  BILITOT  --   --   --  0.6 0.6  PROT  --   --   --  5.7* 5.8*  ALBUMIN 2.2* 2.4* 3.3* 2.8* 2.5*   No results for input(s): "LIPASE", "AMYLASE" in the last 168 hours. No results for input(s): "AMMONIA" in the last 168 hours. Coagulation Profile: No results for input(s): "INR", "PROTIME" in the last 168 hours. Cardiac Enzymes: No results for input(s): "CKTOTAL", "CKMB", "CKMBINDEX", "TROPONINI" in the last 168 hours. BNP (last 3 results) No results for input(s): "PROBNP" in the last 8760 hours. HbA1C: No results for input(s): "HGBA1C" in the last 72 hours. CBG: Recent Labs  Lab 07/16/23 0016 07/16/23 0401 07/16/23 0754 07/16/23 1205 07/16/23 1636  GLUCAP 110* 91 94 133* 114*   Lipid Profile: No results for input(s): "CHOL", "HDL", "LDLCALC", "TRIG", "CHOLHDL", "LDLDIRECT" in the last 72 hours. Thyroid Function Tests: No results for input(s): "TSH", "T4TOTAL", "FREET4", "T3FREE", "THYROIDAB" in the last 72 hours. Anemia Panel: Recent Labs    07/16/23 0349 07/16/23 1213  VITAMINB12 569  --   FOLATE 12.0  --   FERRITIN 147  --   TIBC 125*  --   IRON 37*  --   RETICCTPCT  --  1.8   Sepsis Labs: No results for input(s): "PROCALCITON", "LATICACIDVEN" in the last 168 hours.  No results found for this or any previous visit (from the past 240 hour(s)).   Radiology Studies: DG Abd 1 View  Result Date: 07/15/2023 CLINICAL DATA:  Follow-up for ileus. EXAM: ABDOMEN - 1 VIEW COMPARISON:  07/14/2023 film. FINDINGS: 4:44 a.m. There is mild gas distention again noted of a redundant sigmoid loop in the central abdomen. Rest of the aerated bowel appears normal in caliber. There is no supine evidence of free air. IMPRESSION: Mild gas distention of a redundant sigmoid loop in the central abdomen. No evidence of bowel obstruction. Electronically Signed   By: Almira Bar M.D.   On: 07/15/2023 07:07    Scheduled Meds:  Chlorhexidine Gluconate  Cloth  6 each Topical Daily   enoxaparin (LOVENOX) injection  80 mg Subcutaneous Q12H   fluconazole  100 mg Oral Daily   insulin glargine-yfgn  8 Units Subcutaneous QHS   levETIRAcetam  500 mg Oral BID   magic mouthwash  10 mL Oral QID   metoCLOPramide  5 mg Oral Q12H   metoprolol tartrate  25 mg Oral BID   nystatin  5 mL Oral QID   mouth rinse  15 mL Mouth Rinse 4 times per day   pantoprazole  40 mg Oral Daily   PARoxetine  10 mg Oral Daily   polyethylene glycol  17 g Oral Daily   simethicone  40 mg Oral QID   simvastatin  20  mg Oral QHS   sodium bicarbonate  650 mg Oral BID   sodium chloride flush  3 mL Intravenous Q12H   Continuous Infusions:  0.9 % NaCl with KCl 20 mEq / L 40 mL/hr at 07/16/23 1545    LOS: 31 days   Marguerita Merles, DO [facility] Available via Epic secure chat 7am-7pm After these hours, please refer to coverage provider listed on [email or web address] 07/16/2023, 5:59 PM

## 2023-07-17 DIAGNOSIS — E1110 Type 2 diabetes mellitus with ketoacidosis without coma: Secondary | ICD-10-CM | POA: Diagnosis not present

## 2023-07-17 DIAGNOSIS — I2510 Atherosclerotic heart disease of native coronary artery without angina pectoris: Secondary | ICD-10-CM | POA: Diagnosis not present

## 2023-07-17 DIAGNOSIS — R1310 Dysphagia, unspecified: Secondary | ICD-10-CM | POA: Diagnosis not present

## 2023-07-17 DIAGNOSIS — A419 Sepsis, unspecified organism: Secondary | ICD-10-CM | POA: Diagnosis not present

## 2023-07-17 LAB — COMPREHENSIVE METABOLIC PANEL
ALT: 26 U/L (ref 0–44)
AST: 33 U/L (ref 15–41)
Albumin: 2.6 g/dL — ABNORMAL LOW (ref 3.5–5.0)
Alkaline Phosphatase: 27 U/L — ABNORMAL LOW (ref 38–126)
Anion gap: 5 (ref 5–15)
BUN: 5 mg/dL — ABNORMAL LOW (ref 8–23)
CO2: 19 mmol/L — ABNORMAL LOW (ref 22–32)
Calcium: 7.2 mg/dL — ABNORMAL LOW (ref 8.9–10.3)
Chloride: 112 mmol/L — ABNORMAL HIGH (ref 98–111)
Creatinine, Ser: 1.25 mg/dL — ABNORMAL HIGH (ref 0.61–1.24)
GFR, Estimated: 59 mL/min — ABNORMAL LOW (ref >60)
Glucose, Bld: 132 mg/dL — ABNORMAL HIGH (ref 70–99)
Potassium: 2.6 mmol/L — CL (ref 3.5–5.1)
Sodium: 136 mmol/L (ref 135–145)
Total Bilirubin: 0.5 mg/dL (ref <1.2)
Total Protein: 5.9 g/dL — ABNORMAL LOW (ref 6.5–8.1)

## 2023-07-17 LAB — CBC WITH DIFFERENTIAL/PLATELET
Abs Immature Granulocytes: 0.03 10*3/uL (ref 0.00–0.07)
Basophils Absolute: 0.1 10*3/uL (ref 0.0–0.1)
Basophils Relative: 1 %
Eosinophils Absolute: 0.2 10*3/uL (ref 0.0–0.5)
Eosinophils Relative: 4 %
HCT: 27.8 % — ABNORMAL LOW (ref 39.0–52.0)
Hemoglobin: 8.9 g/dL — ABNORMAL LOW (ref 13.0–17.0)
Immature Granulocytes: 1 %
Lymphocytes Relative: 25 %
Lymphs Abs: 1.4 10*3/uL (ref 0.7–4.0)
MCH: 28.9 pg (ref 26.0–34.0)
MCHC: 32 g/dL (ref 30.0–36.0)
MCV: 90.3 fL (ref 80.0–100.0)
Monocytes Absolute: 0.7 10*3/uL (ref 0.1–1.0)
Monocytes Relative: 12 %
Neutro Abs: 3.1 10*3/uL (ref 1.7–7.7)
Neutrophils Relative %: 57 %
Platelets: 197 10*3/uL (ref 150–400)
RBC: 3.08 MIL/uL — ABNORMAL LOW (ref 4.22–5.81)
RDW: 15.4 % (ref 11.5–15.5)
WBC: 5.4 10*3/uL (ref 4.0–10.5)
nRBC: 0 % (ref 0.0–0.2)

## 2023-07-17 LAB — GLUCOSE, CAPILLARY
Glucose-Capillary: 102 mg/dL — ABNORMAL HIGH (ref 70–99)
Glucose-Capillary: 89 mg/dL (ref 70–99)
Glucose-Capillary: 106 mg/dL — ABNORMAL HIGH (ref 70–99)
Glucose-Capillary: 95 mg/dL (ref 70–99)
Glucose-Capillary: 77 mg/dL (ref 70–99)

## 2023-07-17 LAB — PHOSPHORUS: Phosphorus: 2.1 mg/dL — ABNORMAL LOW (ref 2.5–4.6)

## 2023-07-17 LAB — MAGNESIUM: Magnesium: 2.1 mg/dL (ref 1.7–2.4)

## 2023-07-17 MED ORDER — POLYETHYLENE GLYCOL 3350 17 G PO PACK: 17.0000 g | PACK | Freq: Every day | ORAL | Status: DC | PRN

## 2023-07-17 MED ORDER — DEXTROSE 5 % IV SOLN
30.0000 mmol | Freq: Once | INTRAVENOUS | Status: AC
  Administered 2023-07-17: 30 mmol via INTRAVENOUS
  Filled 2023-07-17: qty 10

## 2023-07-17 MED ORDER — POTASSIUM CHLORIDE IN NACL 20-0.9 MEQ/L-% IV SOLN
INTRAVENOUS | Status: AC
  Filled 2023-07-17: qty 1000

## 2023-07-17 MED ORDER — POTASSIUM CHLORIDE CRYS ER 20 MEQ PO TBCR
40.0000 meq | EXTENDED_RELEASE_TABLET | Freq: Two times a day (BID) | ORAL | Status: AC
  Administered 2023-07-18: 40 meq via ORAL
  Filled 2023-07-17 (×2): qty 2

## 2023-07-17 MED ORDER — POTASSIUM CHLORIDE 10 MEQ/100ML IV SOLN
10.0000 meq | INTRAVENOUS | Status: AC
Start: 2023-07-17 — End: 2023-07-17
  Administered 2023-07-17 (×6): 10 meq via INTRAVENOUS
  Filled 2023-07-17 (×6): qty 100

## 2023-07-17 NOTE — Plan of Care (Signed)
  Problem: Metabolic: Goal: Ability to maintain appropriate glucose levels will improve Outcome: Progressing   Problem: Nutritional: Goal: Maintenance of adequate nutrition will improve Outcome: Progressing   Problem: Skin Integrity: Goal: Risk for impaired skin integrity will decrease Outcome: Progressing   Problem: Education: Goal: Knowledge of General Education information will improve Description: Including pain rating scale, medication(s)/side effects and non-pharmacologic comfort measures Outcome: Progressing   Problem: Coping: Goal: Level of anxiety will decrease Outcome: Progressing   Problem: Pain Managment: Goal: General experience of comfort will improve Outcome: Progressing   Problem: Safety: Goal: Ability to remain free from injury will improve Outcome: Progressing   Problem: Skin Integrity: Goal: Risk for impaired skin integrity will decrease Outcome: Progressing

## 2023-07-17 NOTE — Progress Notes (Signed)
PROGRESS NOTE    Eric Zamora  JXB:[id number] DOB: February 21, 1946 DOA: 06/15/2023 PCP: Uvaldo Bristle, PA-C   Brief Narrative:  Brief hospital course: Mr. Eric Zamora is a 77 y.o. 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, CAD, dCHF, HTN, hx DVT no longer on [facility], and HLD who admitted on 10/15 for severe sepsis due to perirectal abscess. 10/15 admitted to stepdown unit. 10/17: Overnight with fever, aspiration event, new AG acidosis, transferred back to SDU for BiPAP and insulin drip 10/18: taken to OR for drainage of abscess 10/23 cardiology was consulted for new onset A-fib with RVR. 10/24 nephrology was consulted for AKI and hyponatremia.  CT head was obtained for ongoing AMS and was found to have acute/subacute right MCA territory infarct.  Neurology was also consulted. 10/25.  Palliative care was consulted. 10/31.  Anticoagulation started again. 11/1.  Second dose of Bicillin given.  Underwent MBS. 11/2.  Marinol started for poor p.o. intake. 11/4.  Found to have ileus.  GI consulted.  Recommended DNR to legal guardian. 11/5.  Two-physician DNR form was signed and sent to guardian.  Will await decision.  ** Patient continues to have severe hypokalemia so we will continue to replete.  Patient was tolerating a diet up until last night and this morning was not very hungry.  Had 2 more abdominal distention so we will repeat a CT scan of the abdomen pelvis.  Currently we will hold his laxative for now given that he continues to have some quite a bit of loose stools.  Currently been restarted on topical treatment  Assessment and Plan:  Severe Sepsis due to Perirectal Abscess present on admission. -Patient seen in consultation by general surgery and underwent I&D on 06/18/2023. -Penrose drain placed and later removed.  No cultures were sent. -Patient noted to remain on IV vancomycin, Flagyl, Rocephin subsequently transitioned to cefepime and  has been transition to Augmentin and doxycycline and recommending a total of 2 weeks of antibiotic treatment from initial I&D. -Antibiotic course completed. -Supportive care. -WOC nurse consulted and there is concern for a stage II pressure injury but this is the area where the patient had a repair rectal abscess that was drained and about the same region with a Penrose drain and on today's presentation it appeared to be a healing surgical incision  Colonic ileus, improving  -Noted on x-ray 07/05/2023 and subsequently confirmed on CT scans. -Patient seen in consultation by GI who recommended conservative measures for now with repletion of electrolytes to keep potassium approximately 4, magnesium approximately 2. -Patient with some clinical improvement, abdomen less distended, softer, diffusely nontender to palpation with large watery stools. -MiraLAX has been decreased to daily but will stop today and also hold and stop Reglan.   -Full liquid diet going back to Dysphagia 1 Diet but had issues last night and was not eating very well this AM so will repeat CT Abd/Pelvis without Contrast  -IV fluids now resumed as below  -Rectal tube in place. -Palliative care consulted and DNR recommended to legal guardian. -Frequent turns. -May need to reengage GI if no continued clinical improvement; Was having quite a bit of diarreha and ? Overflow now -Repeat KUB yesterday showed "Scattered large and small bowel gas is noted. Persistent dilatation of a central sigmoid loop is seen and stable. No free air is noted. No new focal dilatation is seen."   New onset A-fib with RVR/HOCM -Patient during the hospitalization to have  new onset A-fib with RVR with CHADS2Vasc score of 4. -Patient seen in consultation by cardiology was on metoprolol 100 mg 4 times daily subsequently changed to Toprol-XL. -Patient placed back on IV Lopressor due to colonic ileus. -Was on Eliquis for anticoagulation now on IV heparin due to  ileus. -IV heparin has abeen transitioned to full dose Lovenox. -Likely resume Eliquis once ileus has fully resolved .   Acute Metabolic Encephalopathy. History of dementia. Acute right MCA stroke. atient noted to have a progressive worsening mentation early on in the hospitalization. -CT head done 06/24/2023 with acute/subacute cortical and subcortical right MCA territory infarct. -MRI brain done consistent with acute infarct. -MRA brain done with no acute abnormalities. -Carotid Dopplers done with no significant ICA stenosis. -EEG done negative for seizures. -Repeat head CT done on 07/01/2023 per neurology recommendations showed no hemorrhagic conversion and as such neurology recommended resumption of anticoagulation with Eliquis. -Patient being followed by SLP was on dysphagia 1 diet however had to be placed on full liquids due to colonic ileus but will go back to Dysphagia 1 Diet -PT/OT following and recommending SNF placement.   History of Seizures. -EEG done negative for seizures/epileptiform activity. -Depakote on hold due to hepatic function. -Was on Keppra which is currently on hold but will resume today -Seizure Precautions   Moderate MR. -Continue to Monitor volume status.  Dysphagia.  Due to stroke. -Secondary to acute CVA. -Being followed by SLP, underwent MBS was on a dysphagia 1 diet and subsequently downgraded to clear liquids due to colonic ileus and now has been advanced to a full liquid diet.   -Once ileus resolved will resume dysphagia 1 diet. -SLP following.  Electrolyte abnormalities including Severe Hypokalemia/Hypocalcemia/Hypophosphatemia/Hypomagnesemia -Electrolyte Trend: Recent Labs  Lab 07/12/23 1340 07/13/23 0319 07/13/23 1410 07/14/23 0313 07/15/23 0327 07/16/23 0349 07/17/23 0215  K 2.5* 2.4* 2.5* 2.3* 2.4* 2.7* 2.6*  CALCIUM 7.9* 7.6* 7.8* 7.5* 7.3* 7.4* 7.2*  PHOS  --  2.1*  --  2.6 1.9* 2.1* 2.1*  MG  --  1.8  --  1.8 2.2 2.1 2.1   -Replete with po KCL 40 mEQ BID x2, IV KCL 60 mEQ, IV K Phos 30 mmol, and NS + 20 mEQ Kcl at 40 mL/hr -Continue to Monitor and Replete as Necessary    Hypernatremia, improved  -Likely secondary to hypovolemic hypernatremia secondary to dehydration. -Na+ Trend: Recent Labs  Lab 07/12/23 1340 07/13/23 0319 07/13/23 1410 07/14/23 0313 07/15/23 0327 07/16/23 0349 07/17/23 0215  NA 137 138 139 137 138 140 136  -Improved with hydration and change of IV fluids to D5W. -Renew IVF with NS + 20 mEQ Kcl at 40 mL/hr x1 Day -Repeat labs in the AM.   History of syphilis. -RPR reactive for 2 years. -Dr. Allena Katz discussed with ID and patient currently being treated with Bicillin on a weekly basis.  Received a dose 07/09/2023.   AKI on CKD 3B Non Anion Gap Metabolic Acidosis -BUN/Cr Trend: Recent Labs  Lab 07/12/23 1340 07/13/23 0319 07/13/23 1410 07/14/23 0313 07/15/23 0327 07/16/23 0349 07/17/23 0215  BUN 6* 5* 5* <5* <5* <5* 5*  CREATININE 1.21 1.18 1.19 1.12 1.25* 1.34* 1.25*  -Bicarbonate gtt discontinued; Acidosis now shows a CO2 is now 19, anion gap is 6, chloride level was 115 -Will start sodium bicarb tabs 650 mg p.o. twice daily and resume IVF as above -Avoid Nephrotoxic Medications, Contrast Dyes, Hypotension and Dehydration to Ensure Adequate Renal Perfusion and will need to Renally Adjust Meds -  Continue to Monitor and Trend Renal Function carefully and repeat CMP in the AM   CAD -Patient not on DAPT or antiplatelets secondary to being on Eliquis. -Was on IV heparin due to ileus but transitioned to Lovenox.  -Outpatient follow-up with cardiology.   Bipolar Disorder. -Not on any medications prior to admission. -Stable.  Normocytic Anemia -Hgb/Hct Trend: Recent Labs  Lab 07/11/23 0219 07/12/23 0154 07/13/23 0319 07/14/23 0313 07/15/23 0327 07/16/23 1213 07/17/23 0215  HGB 10.3* 10.0* 9.3* 8.7* 8.9* 9.2* 8.9*  HCT 31.1* 31.1* 28.9* 26.4* 27.6* 28.1* 27.8*  MCV  88.1 90.4 90.0 89.8 89.6 89.8 90.3  -Checked Anemia Panel and showed an iron level of 37, UIBC of 88, TIBC of 125, saturation ratio 30%, ferritin level 147, folate level 12.0, vitamin B12 569 -Continue to Monitor for S/Sx of Bleeding; No overt bleeding noted -Repeat CBC in the AM   Type II DM, uncontrolled with hyperglycemia. -Currently on sliding scale insulin. -CBG Trend: Recent Labs  Lab 07/16/23 1636 07/16/23 2039 07/16/23 2356 07/17/23 0426 07/17/23 0745 07/17/23 1153 07/17/23 1626  GLUCAP 114* 117* 142* 102* 89 106* 95   Poor p.o. intake. -Likely multifactorial secondary to acute CVA and colonic ileus. -Was initially placed on Marinol which was currently on hold due to ileus. -Tolerating full liquid diet and will go back to Dysphagia 1 Diet and was tolerating this well up until last night and this AM and didn't want to eat -Will repeat CT Scan of the Abd/Pelvis   Thrush Possible pill induced Gastritis. -Patient completed course of antibiotics. -Was on Diflucan through 07/08/2023 and Thrush worsened now. -Status post completion of nystatin swish and swallow but will resume -Nystatin and Fluconazole ordered again  Goals of care conversation. -Dr. Allena Katz discussed with legal guardian on the phone. -Patient noted with poor prognosis due to large right MCA infarct with deficits and dysphagia with dysarthria. -Patient noted with poor oral intake and now with colonic ileus currently under conservative treatment. -It is felt with acute CVA patient likely will not return to his prior status prior to admission. -It is felt that the patient has a cardiac arrest prognosis remains very poor to return to his prior status prior to arrest. -Patient with poor quality of life and Dr. Allena Katz recommended legal guardian to consider DNR for patient. -Palliative care following.  Hypoalbuminemia -Patient's Albumin Trend: Recent Labs  Lab 07/11/23 0219 07/12/23 0154 07/13/23 0319  07/14/23 0313 07/15/23 0327 07/16/23 0349 07/17/23 0215  ALBUMIN 2.4* 2.2* 2.4* 3.3* 2.8* 2.5* 2.6*  -Continue to Monitor and Trend and repeat CMP in the AM  Obesity -Complicates overall prognosis and care -Estimated body mass index is 30.71 kg/m as calculated from the following:   Height as of this encounter: 5\' 5"  (1.651 m).   Weight as of this encounter: 83.7 kg.  -Weight Loss and Dietary Counseling given   DVT prophylaxis: SCDs Start: 06/15/23 1459    Code Status: Limited: Do not attempt resuscitation (DNR) -DNR-LIMITED -Do Not Intubate/DNI  Family Communication: No family present at bedside  Disposition Plan:  Level of care: Telemetry Status is: Inpatient Remains inpatient appropriate because: Needs further clinical improvement and will obtain a CT scan of the abdomen pelvis to further evaluate his abdominal distention   Consultants:  General surgery: Dr. Derrell Lolling 06/18/2023 Gastroenterology: Dr. Ewing Schlein 07/05/2023 Cardiology: Dr. Royann Shivers 06/23/2023 Nephrology: Dr. Ronalee Belts 06/24/2023 Neurology: Dr.Bhagat 06/24/2023 Palliative care: Dr. Patterson Hammersmith 06/25/2023  Procedures:  Repeat CT Abd/Pelvis ordered  PICC line placement 07/08/2023 CT  abdomen and pelvis 07/05/2023 CT head 06/15/2023, 06/24/2023, 07/01/2023 Chest x-ray 06/15/2023, 06/17/2023 Modified barium swallow 06/19/2023 Abdominal films 07/05/2023, 07/06/2023, 07/08/2023, 07/09/2023, 07/10/2023, 07/12/2023 MRI brain 06/25/2023 MRA head 06/25/2023 Right upper quadrant ultrasound 06/22/2023 2D echo 06/16/2023 Carotid Dopplers 06/25/2023 CT chest abdomen and pelvis 06/17/2023 EEG 06/25/2023 Irrigation and debridement perirectal abscess per general surgery: Dr. Derrell Lolling 06/18/2023  Antimicrobials:  Anti-infectives (From admission, onward)    Start     Dose/Rate Route Frequency Ordered Stop   07/16/23 1300  fluconazole (DIFLUCAN) tablet 100 mg        100 mg Oral Daily 07/16/23 1154     07/05/23 1600  fluconazole (DIFLUCAN)  IVPB 200 mg        200 mg 100 mL/hr over 60 Minutes Intravenous Every 24 hours 07/05/23 1320 07/08/23 1658   07/02/23 1000  fluconazole (DIFLUCAN) tablet 100 mg  Status:  Discontinued        100 mg Oral Daily 07/02/23 0802 07/05/23 1320   07/02/23 0800  penicillin g benzathine (BICILLIN LA) 1200000 UNIT/2ML injection 2.4 Million Units        2.4 Million Units Intramuscular Weekly 07/01/23 1209 07/09/23 0950   06/26/23 1500  penicillin g benzathine (BICILLIN LA) 1200000 UNIT/2ML injection 2.4 Million Units        2.4 Million Units Intramuscular  Once 06/26/23 1351 06/28/23 0018   06/24/23 2200  amoxicillin-clavulanate (AUGMENTIN) 500-125 MG per tablet 1 tablet  Status:  Discontinued        1 tablet Oral Every 12 hours 06/24/23 1450 07/03/23 1202   06/24/23 2200  amoxicillin-clavulanate (AUGMENTIN) 500-125 MG per tablet 1 tablet  Status:  Discontinued        1 tablet Oral 2 times daily 06/24/23 1451 06/24/23 1451   06/21/23 1215  doxycycline (VIBRA-TABS) tablet 100 mg  Status:  Discontinued        100 mg Oral Every 12 hours 06/21/23 1128 07/03/23 1202   06/21/23 1215  amoxicillin-clavulanate (AUGMENTIN) 875-125 MG per tablet 1 tablet  Status:  Discontinued        1 tablet Oral Every 12 hours 06/21/23 1128 06/24/23 1450   06/20/23 1200  vancomycin (VANCOREADY) IVPB 1500 mg/300 mL  Status:  Discontinued        1,500 mg 150 mL/hr over 120 Minutes Intravenous Every 36 hours 06/20/23 0719 06/21/23 1128   06/19/23 0000  vancomycin (VANCOREADY) IVPB 1250 mg/250 mL  Status:  Discontinued        1,250 mg 166.7 mL/hr over 90 Minutes Intravenous Every 36 hours 06/18/23 0939 06/20/23 0719   06/18/23 1800  ceFEPIme (MAXIPIME) 2 g in sodium chloride 0.9 % 100 mL IVPB  Status:  Discontinued        2 g 200 mL/hr over 30 Minutes Intravenous Every 12 hours 06/18/23 0939 06/21/23 1128   06/18/23 0600  ceFEPIme (MAXIPIME) 2 g in sodium chloride 0.9 % 100 mL IVPB  Status:  Discontinued        2 g 200 mL/hr over  30 Minutes Intravenous Every 24 hours 06/17/23 0920 06/18/23 0939   06/17/23 1400  vancomycin (VANCOCIN) IVPB 1000 mg/200 mL premix  Status:  Discontinued        1,000 mg 200 mL/hr over 60 Minutes Intravenous Every 36 hours 06/17/23 0920 06/18/23 0939   06/16/23 1800  vancomycin (VANCOREADY) IVPB 750 mg/150 mL  Status:  Discontinued        750 mg 150 mL/hr over 60 Minutes Intravenous Every 24 hours 06/15/23 1639  06/17/23 0920   06/16/23 1100  cefTRIAXone (ROCEPHIN) 2 g in sodium chloride 0.9 % 100 mL IVPB  Status:  Discontinued        2 g 200 mL/hr over 30 Minutes Intravenous Every 24 hours 06/15/23 1453 06/15/23 1455   06/15/23 1800  ceFEPIme (MAXIPIME) 2 g in sodium chloride 0.9 % 100 mL IVPB  Status:  Discontinued        2 g 200 mL/hr over 30 Minutes Intravenous Every 12 hours 06/15/23 1558 06/17/23 0920   06/15/23 1700  metroNIDAZOLE (FLAGYL) IVPB 500 mg  Status:  Discontinued        500 mg 100 mL/hr over 60 Minutes Intravenous Every 12 hours 06/15/23 1455 06/21/23 1128   06/15/23 1630  vancomycin (VANCOREADY) IVPB 1500 mg/300 mL        1,500 mg 150 mL/hr over 120 Minutes Intravenous  Once 06/15/23 1558 06/15/23 2058   06/15/23 1045  cefTRIAXone (ROCEPHIN) 2 g in sodium chloride 0.9 % 100 mL IVPB        2 g 200 mL/hr over 30 Minutes Intravenous  Once 06/15/23 1041 06/15/23 1155       Subjective: Seen and examined at bedside and was a bit more lethargic today and not as interactive.  Had some more abdominal distention and nursing states that he did not eat his dinner last night or his breakfast this morning.  Denied any other concerns or complaints this time and wanting to rest  Objective: Vitals:   07/16/23 1428 07/16/23 1956 07/17/23 0427 07/17/23 1324  BP: (!) 103/91 117/78 124/75 (!) 107/56  Pulse: 93 99 (!) 53 96  Resp: 17 16 20 18   Temp: 98.4 F (36.9 C) 99.4 F (37.4 C) 98.9 F (37.2 C) 98.8 F (37.1 C)  TempSrc: Oral Oral Oral   SpO2: 100% 100% 100% 100%  Weight:    83.7 kg   Height:        Intake/Output Summary (Last 24 hours) at 07/17/2023 1910 Last data filed at 07/17/2023 1500 Gross per 24 hour  Intake 1604.5 ml  Output 300 ml  Net 1304.5 ml   Filed Weights   07/15/23 0500 07/16/23 0500 07/17/23 0427  Weight: 85.3 kg 85.3 kg 83.7 kg   Examination: Physical Exam:  Constitutional: Ill-appearing African-American male in no acute distress appears more lethargic today Respiratory: Diminished to auscultation bilaterally, no wheezing, rales, rhonchi or crackles. Normal respiratory effort and patient is not tachypenic. No accessory muscle use.  Unlabored breathing Cardiovascular: RRR, no murmurs / rubs / gallops. S1 and S2 auscultated. No extremity edema.  Abdomen: Soft, a little tender to palpate and distended secondary body habitus.. Bowel sounds positive but a little hypoactive.  GU: Deferred. Musculoskeletal: No clubbing / cyanosis of digits/nails. No joint deformity upper and lower extremities.  Neurologic: Has a left-sided facial droop and some decreased strength Psychiatric: A little bit more somnolent and drowsy and withdrawn today  Data Reviewed: I have personally reviewed following labs and imaging studies  CBC: Recent Labs  Lab 07/13/23 0319 07/14/23 0313 07/15/23 0327 07/16/23 1213 07/17/23 0215  WBC 6.0 5.7 4.8 4.4 5.4  NEUTROABS  --   --  2.8 2.4 3.1  HGB 9.3* 8.7* 8.9* 9.2* 8.9*  HCT 28.9* 26.4* 27.6* 28.1* 27.8*  MCV 90.0 89.8 89.6 89.8 90.3  PLT 166 171 158 190 197   Basic Metabolic Panel: Recent Labs  Lab 07/13/23 0319 07/13/23 1410 07/14/23 0313 07/15/23 0327 07/16/23 0349 07/17/23 0215  NA 138  139 137 138 140 136  K 2.4* 2.5* 2.3* 2.4* 2.7* 2.6*  CL 110 110 110 111 115* 112*  CO2 21* 21* 21* 20* 19* 19*  GLUCOSE 113* 112* 106* 97 101* 132*  BUN 5* 5* <5* <5* <5* 5*  CREATININE 1.18 1.19 1.12 1.25* 1.34* 1.25*  CALCIUM 7.6* 7.8* 7.5* 7.3* 7.4* 7.2*  MG 1.8  --  1.8 2.2 2.1 2.1  PHOS 2.1*  --  2.6 1.9*  2.1* 2.1*   GFR: Estimated Creatinine Clearance: 48.5 mL/min (A) (by C-G formula based on SCr of 1.25 mg/dL (H)). Liver Function Tests: Recent Labs  Lab 07/13/23 0319 07/14/23 0313 07/15/23 0327 07/16/23 0349 07/17/23 0215  AST  --   --  32 27 33  ALT  --   --  25 23 26   ALKPHOS  --   --  27* 25* 27*  BILITOT  --   --  0.6 0.6 0.5  PROT  --   --  5.7* 5.8* 5.9*  ALBUMIN 2.4* 3.3* 2.8* 2.5* 2.6*   No results for input(s): "LIPASE", "AMYLASE" in the last 168 hours. No results for input(s): "AMMONIA" in the last 168 hours. Coagulation Profile: No results for input(s): "INR", "PROTIME" in the last 168 hours. Cardiac Enzymes: No results for input(s): "CKTOTAL", "CKMB", "CKMBINDEX", "TROPONINI" in the last 168 hours. BNP (last 3 results) No results for input(s): "PROBNP" in the last 8760 hours. HbA1C: No results for input(s): "HGBA1C" in the last 72 hours. CBG: Recent Labs  Lab 07/16/23 2356 07/17/23 0426 07/17/23 0745 07/17/23 1153 07/17/23 1626  GLUCAP 142* 102* 89 106* 95   Lipid Profile: No results for input(s): "CHOL", "HDL", "LDLCALC", "TRIG", "CHOLHDL", "LDLDIRECT" in the last 72 hours. Thyroid Function Tests: No results for input(s): "TSH", "T4TOTAL", "FREET4", "T3FREE", "THYROIDAB" in the last 72 hours. Anemia Panel: Recent Labs    07/16/23 0349 07/16/23 1213  VITAMINB12 569  --   FOLATE 12.0  --   FERRITIN 147  --   TIBC 125*  --   IRON 37*  --   RETICCTPCT  --  1.8   Sepsis Labs: No results for input(s): "PROCALCITON", "LATICACIDVEN" in the last 168 hours.  No results found for this or any previous visit (from the past 240 hour(s)).   Radiology Studies: DG Abd 1 View  Result Date: 07/16/2023 CLINICAL DATA:  History of perirectal abscess and sepsis, follow-up bowel dilatation EXAM: ABDOMEN - 1 VIEW COMPARISON:  07/15/2023 FINDINGS: Scattered large and small bowel gas is noted. Persistent dilatation of a central sigmoid loop is seen and stable. No  free air is noted. No new focal dilatation is seen. IMPRESSION: Stable appearance when compared with the prior day. Electronically Signed   By: Alcide Clever M.D.   On: 07/16/2023 22:23    Scheduled Meds:  Chlorhexidine Gluconate Cloth  6 each Topical Daily   enoxaparin (LOVENOX) injection  80 mg Subcutaneous Q12H   fluconazole  100 mg Oral Daily   insulin glargine-yfgn  8 Units Subcutaneous QHS   levETIRAcetam  500 mg Oral BID   magic mouthwash  10 mL Oral QID   metoprolol tartrate  25 mg Oral BID   nystatin  5 mL Oral QID   mouth rinse  15 mL Mouth Rinse 4 times per day   pantoprazole  40 mg Oral Daily   PARoxetine  10 mg Oral Daily   potassium chloride  40 mEq Oral BID   simethicone  40 mg Oral QID  simvastatin  20 mg Oral QHS   sodium bicarbonate  650 mg Oral BID   sodium chloride flush  3 mL Intravenous Q12H   Continuous Infusions:  0.9 % NaCl with KCl 20 mEq / L      LOS: 32 days   Marguerita Merles, DO [facility] Available via Epic secure chat 7am-7pm After these hours, please refer to coverage provider listed on [email or web address] 07/17/2023, 7:10 PM

## 2023-07-17 NOTE — Progress Notes (Signed)
Critical Potassium 2.6. 6 runs IV K+ ordered and then Potassium Phosphate ordered

## 2023-07-17 NOTE — Plan of Care (Signed)
Problem: Education: Goal: Ability to describe self-care measures that may prevent or decrease complications (Diabetes Survival Skills Education) will improve Outcome: Progressing Goal: Individualized Educational Video(s) Outcome: Progressing   Problem: Coping: Goal: Ability to adjust to condition or change in health will improve Outcome: Progressing   Problem: Fluid Volume: Goal: Ability to maintain a balanced intake and output will improve Outcome: Progressing   Problem: Health Behavior/Discharge Planning: Goal: Ability to identify and utilize available resources and services will improve Outcome: Progressing Goal: Ability to manage health-related needs will improve Outcome: Progressing   Problem: Metabolic: Goal: Ability to maintain appropriate glucose levels will improve Outcome: Progressing   Problem: Nutritional: Goal: Maintenance of adequate nutrition will improve Outcome: Progressing Goal: Progress toward achieving an optimal weight will improve Outcome: Progressing   Problem: Skin Integrity: Goal: Risk for impaired skin integrity will decrease Outcome: Progressing   Problem: Tissue Perfusion: Goal: Adequacy of tissue perfusion will improve Outcome: Progressing   Problem: Education: Goal: Knowledge of General Education information will improve Description: Including pain rating scale, medication(s)/side effects and non-pharmacologic comfort measures Outcome: Progressing   Problem: Health Behavior/Discharge Planning: Goal: Ability to manage health-related needs will improve Outcome: Progressing   Problem: Clinical Measurements: Goal: Ability to maintain clinical measurements within normal limits will improve Outcome: Progressing Goal: Will remain free from infection Outcome: Progressing Goal: Diagnostic test results will improve Outcome: Progressing Goal: Respiratory complications will improve Outcome: Progressing Goal: Cardiovascular complication will  be avoided Outcome: Progressing   Problem: Activity: Goal: Risk for activity intolerance will decrease Outcome: Progressing   Problem: Nutrition: Goal: Adequate nutrition will be maintained Outcome: Progressing   Problem: Coping: Goal: Level of anxiety will decrease Outcome: Progressing   Problem: Elimination: Goal: Will not experience complications related to bowel motility Outcome: Progressing Goal: Will not experience complications related to urinary retention Outcome: Progressing   Problem: Pain Managment: Goal: General experience of comfort will improve Outcome: Progressing   Problem: Safety: Goal: Ability to remain free from injury will improve Outcome: Progressing   Problem: Skin Integrity: Goal: Risk for impaired skin integrity will decrease Outcome: Progressing   Problem: Fluid Volume: Goal: Hemodynamic stability will improve Outcome: Progressing   Problem: Clinical Measurements: Goal: Diagnostic test results will improve Outcome: Progressing Goal: Signs and symptoms of infection will decrease Outcome: Progressing   Problem: Respiratory: Goal: Ability to maintain adequate ventilation will improve Outcome: Progressing   Problem: Education: Goal: Ability to describe self-care measures that may prevent or decrease complications (Diabetes Survival Skills Education) will improve Outcome: Progressing Goal: Individualized Educational Video(s) Outcome: Progressing   Problem: Cardiac: Goal: Ability to maintain an adequate cardiac output will improve Outcome: Progressing   Problem: Health Behavior/Discharge Planning: Goal: Ability to identify and utilize available resources and services will improve Outcome: Progressing Goal: Ability to manage health-related needs will improve Outcome: Progressing   Problem: Fluid Volume: Goal: Ability to achieve a balanced intake and output will improve Outcome: Progressing   Problem: Metabolic: Goal: Ability to  maintain appropriate glucose levels will improve Outcome: Progressing   Problem: Nutritional: Goal: Maintenance of adequate nutrition will improve Outcome: Progressing Goal: Maintenance of adequate weight for body size and type will improve Outcome: Progressing   Problem: Respiratory: Goal: Will regain and/or maintain adequate ventilation Outcome: Progressing   Problem: Urinary Elimination: Goal: Ability to achieve and maintain adequate renal perfusion and functioning will improve Outcome: Progressing   Problem: Education: Goal: Knowledge of disease or condition will improve Outcome: Progressing Goal: Knowledge of secondary prevention will improve (  MUST DOCUMENT ALL) Outcome: Progressing Goal: Knowledge of patient specific risk factors will improve Loraine Leriche N/A or DELETE if not current risk factor) Outcome: Progressing   Problem: Ischemic Stroke/TIA Tissue Perfusion: Goal: Complications of ischemic stroke/TIA will be minimized Outcome: Progressing

## 2023-07-18 ENCOUNTER — Inpatient Hospital Stay (HOSPITAL_COMMUNITY): Payer: Medicare (Managed Care)

## 2023-07-18 DIAGNOSIS — E1110 Type 2 diabetes mellitus with ketoacidosis without coma: Secondary | ICD-10-CM | POA: Diagnosis not present

## 2023-07-18 DIAGNOSIS — R1310 Dysphagia, unspecified: Secondary | ICD-10-CM | POA: Diagnosis not present

## 2023-07-18 DIAGNOSIS — I2510 Atherosclerotic heart disease of native coronary artery without angina pectoris: Secondary | ICD-10-CM | POA: Diagnosis not present

## 2023-07-18 DIAGNOSIS — A419 Sepsis, unspecified organism: Secondary | ICD-10-CM | POA: Diagnosis not present

## 2023-07-18 DIAGNOSIS — K529 Noninfective gastroenteritis and colitis, unspecified: Secondary | ICD-10-CM

## 2023-07-18 LAB — CBC WITH DIFFERENTIAL/PLATELET
Abs Immature Granulocytes: 0.01 10*3/uL (ref 0.00–0.07)
Basophils Absolute: 0 10*3/uL (ref 0.0–0.1)
Basophils Relative: 1 %
Eosinophils Absolute: 0.3 10*3/uL (ref 0.0–0.5)
Eosinophils Relative: 5 %
HCT: 28.6 % — ABNORMAL LOW (ref 39.0–52.0)
Hemoglobin: 9.1 g/dL — ABNORMAL LOW (ref 13.0–17.0)
Immature Granulocytes: 0 %
Lymphocytes Relative: 25 %
Lymphs Abs: 1.2 10*3/uL (ref 0.7–4.0)
MCH: 28.9 pg (ref 26.0–34.0)
MCHC: 31.8 g/dL (ref 30.0–36.0)
MCV: 90.8 fL (ref 80.0–100.0)
Monocytes Absolute: 0.5 10*3/uL (ref 0.1–1.0)
Monocytes Relative: 11 %
Neutro Abs: 2.9 10*3/uL (ref 1.7–7.7)
Neutrophils Relative %: 58 %
Platelets: 208 10*3/uL (ref 150–400)
RBC: 3.15 MIL/uL — ABNORMAL LOW (ref 4.22–5.81)
RDW: 15.1 % (ref 11.5–15.5)
WBC: 4.9 10*3/uL (ref 4.0–10.5)
nRBC: 0 % (ref 0.0–0.2)

## 2023-07-18 LAB — PHOSPHORUS: Phosphorus: 2.5 mg/dL (ref 2.5–4.6)

## 2023-07-18 LAB — COMPREHENSIVE METABOLIC PANEL
ALT: 27 U/L (ref 0–44)
AST: 34 U/L (ref 15–41)
Albumin: 2.6 g/dL — ABNORMAL LOW (ref 3.5–5.0)
Alkaline Phosphatase: 30 U/L — ABNORMAL LOW (ref 38–126)
Anion gap: 7 (ref 5–15)
BUN: <5 mg/dL — ABNORMAL LOW (ref 8–23)
CO2: 20 mmol/L — ABNORMAL LOW (ref 22–32)
Calcium: 7.4 mg/dL — ABNORMAL LOW (ref 8.9–10.3)
Chloride: 114 mmol/L — ABNORMAL HIGH (ref 98–111)
Creatinine, Ser: 1.1 mg/dL (ref 0.61–1.24)
GFR, Estimated: >60 mL/min (ref >60)
Glucose, Bld: 78 mg/dL (ref 70–99)
Potassium: 2.4 mmol/L — CL (ref 3.5–5.1)
Sodium: 141 mmol/L (ref 135–145)
Total Bilirubin: 0.5 mg/dL (ref <1.2)
Total Protein: 6 g/dL — ABNORMAL LOW (ref 6.5–8.1)

## 2023-07-18 LAB — GLUCOSE, CAPILLARY
Glucose-Capillary: 65 mg/dL — ABNORMAL LOW (ref 70–99)
Glucose-Capillary: 73 mg/dL (ref 70–99)
Glucose-Capillary: 75 mg/dL (ref 70–99)
Glucose-Capillary: 75 mg/dL (ref 70–99)
Glucose-Capillary: 79 mg/dL (ref 70–99)
Glucose-Capillary: 80 mg/dL (ref 70–99)

## 2023-07-18 LAB — MAGNESIUM: Magnesium: 1.9 mg/dL (ref 1.7–2.4)

## 2023-07-18 MED ORDER — IOHEXOL 300 MG/ML  SOLN: 30.0000 mL | Freq: Once | INTRAMUSCULAR | Status: DC | PRN

## 2023-07-18 MED ORDER — POTASSIUM CHLORIDE 10 MEQ/100ML IV SOLN
10.0000 meq | INTRAVENOUS | Status: AC
Start: 2023-07-18 — End: 2023-07-18
  Administered 2023-07-18 (×6): 10 meq via INTRAVENOUS
  Filled 2023-07-18 (×6): qty 100

## 2023-07-18 MED ORDER — PIPERACILLIN-TAZOBACTAM 3.375 G IVPB
3.3750 g | Freq: Three times a day (TID) | INTRAVENOUS | Status: DC
  Administered 2023-07-18 – 2023-07-24 (×18): 3.375 g via INTRAVENOUS
  Filled 2023-07-18 (×18): qty 50

## 2023-07-18 MED ORDER — POTASSIUM CHLORIDE CRYS ER 20 MEQ PO TBCR
40.0000 meq | EXTENDED_RELEASE_TABLET | Freq: Two times a day (BID) | ORAL | Status: AC
  Administered 2023-07-18 – 2023-07-19 (×2): 40 meq via ORAL
  Filled 2023-07-18: qty 2

## 2023-07-18 NOTE — TOC Progression Note (Signed)
Transition of Care [facility]) - Progression Note    Patient Details  Name: Eric Zamora MRN: [id number] Date of Birth: Jan 11, 1946  Transition of Care [facility]) CM/SW Contact  Darleene Cleaver, [place] Phone Number: 07/18/2023, 6:24 PM  Clinical Narrative:     TOC to continue to follow patient's progress throughout discharge planning.  Plan is for patient to go to [facility] once medically ready for discharge.   Expected Discharge Plan: Skilled Nursing Facility Barriers to Discharge: SNF Pending bed offer, Inadequate or no insurance  Expected Discharge Plan and Services     Post Acute Care Choice: Skilled Nursing Facility Living arrangements for the past 2 months: Assisted Living Facility [facility])                                       Social Determinants of Health (SDOH) Interventions SDOH Screenings   Food Insecurity: Patient Unable To Answer (06/15/2023)  Housing: Low Risk  (06/15/2023)  Transportation Needs: Patient Unable To Answer (06/15/2023)  Utilities: Patient Unable To Answer (06/15/2023)  Tobacco Use: Low Risk  (07/05/2023)    Readmission Risk Interventions    06/25/2023    2:17 PM  Readmission Risk Prevention Plan  Transportation Screening Complete  PCP or Specialist Appt within 5-7 Days Complete  Home Care Screening Complete  Medication Review (RN CM) Complete

## 2023-07-18 NOTE — Progress Notes (Signed)
Pharmacy Antibiotic Note  Eric Zamora is a 77 y.o. male admitted on 06/15/2023.  He previously completed Cefepime/Flagyl/Vanc x7 days, followed by Augmentin/Doxy x14 days.  Pharmacy has been consulted for Zosyn dosing for intra-abdominal infection  Plan: Zosyn 3.375g IV Q8H infused over 4hrs.  Follow up renal function, culture results, and clinical course.   Height: 5\' 5"  (165.1 cm) Weight: 82.8 kg (182 lb 8.7 oz) IBW/kg (Calculated) : 61.5  Temp (24hrs), Avg:98.6 F (37 C), Min:97.6 F (36.4 C), Max:99.5 F (37.5 C)  Recent Labs  Lab 07/14/23 0313 07/15/23 0327 07/16/23 0349 07/16/23 1213 07/17/23 0215 07/18/23 0530  WBC 5.7 4.8  --  4.4 5.4 4.9  CREATININE 1.12 1.25* 1.34*  --  1.25* 1.10    Estimated Creatinine Clearance: 54.8 mL/min (by C-G formula based on SCr of 1.1 mg/dL).    No Known Allergies  Antimicrobials this admission: 10/15 Cefepime >> 10/21 10/15 Flagyl >> 10/21  10/15 Vancomycin >> 10/21 10/21 Doxy >> 11/3 10/21 Augmentin >> 11/3 11/1 Fluconazole >> 11/7, resumed 11/15 fluconazole >>  11/17 Zosyn >>   Microbiology results: No new cultures in past 30 days 10/15 BCx: ngF 10/15 MRSA PCR: positive 10/16 resp panel neg 10/17 BCx2: NGF   Thank you for allowing pharmacy to be a part of this patient's care.  Lynann Beaver PharmD, BCPS [facility] main pharmacy [phone] 07/18/2023 6:13 PM

## 2023-07-18 NOTE — Plan of Care (Signed)
  Problem: Coping: Goal: Ability to adjust to condition or change in health will improve Outcome: Progressing   Problem: Fluid Volume: Goal: Ability to maintain a balanced intake and output will improve Outcome: Progressing   Problem: Metabolic: Goal: Ability to maintain appropriate glucose levels will improve Outcome: Progressing   Problem: Clinical Measurements: Goal: Will remain free from infection Outcome: Progressing Goal: Cardiovascular complication will be avoided Outcome: Progressing   Problem: Elimination: Goal: Will not experience complications related to urinary retention Outcome: Progressing   Problem: Safety: Goal: Ability to remain free from injury will improve Outcome: Progressing

## 2023-07-18 NOTE — Progress Notes (Signed)
PROGRESS NOTE    Eric Zamora  FAO:[id number] DOB: 10-29-1945 DOA: 06/15/2023 PCP: Uvaldo Bristle, PA-C   Brief Narrative:  Brief hospital course: Mr. Reifsnyder is a 77 y.o. 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, CAD, dCHF, HTN, hx DVT no longer on [facility], and HLD who admitted on 10/15 for severe sepsis due to perirectal abscess. 10/15 admitted to stepdown unit. 10/17: Overnight with fever, aspiration event, new AG acidosis, transferred back to SDU for BiPAP and insulin drip 10/18: taken to OR for drainage of abscess 10/23 cardiology was consulted for new onset A-fib with RVR. 10/24 nephrology was consulted for AKI and hyponatremia.  CT head was obtained for ongoing AMS and was found to have acute/subacute right MCA territory infarct.  Neurology was also consulted. 10/25.  Palliative care was consulted. 10/31.  Anticoagulation started again. 11/1.  Second dose of Bicillin given.  Underwent MBS. 11/2.  Marinol started for poor p.o. intake. 11/4.  Found to have ileus.  GI consulted.  Recommended DNR to legal guardian. 11/5.  Two-physician DNR form was signed and sent to guardian.  Will await decision.  ** Patient continues to have severe hypokalemia so we will continue to replete.  Patient was tolerating a diet up until last night and this morning was not very hungry.  Had 2 more abdominal distention so we will repeat a CT scan of the abdomen pelvis.    Repeat CT Scan as below and continues to show ileus and now shows Colitis so will start back on IV Zosyn.  Currently we will hold his laxative for now given that he continues to have some quite a bit of loose stools. Will need to discuss with GI again  Assessment and Plan:  Severe Sepsis due to Perirectal Abscess present on admission. -Patient seen in consultation by general surgery and underwent I&D on 06/18/2023. -Penrose drain placed and later removed.  No cultures were  sent. -Patient noted to remain on IV vancomycin, Flagyl, Rocephin subsequently transitioned to cefepime and has been transition to Augmentin and doxycycline and recommending a total of 2 weeks of antibiotic treatment from initial I&D. -Antibiotic course completed. -Supportive care. -WOC nurse consulted and there is concern for a stage II pressure injury but this is the area where the patient had a repair rectal abscess that was drained and about the same region with a Penrose drain and on today's presentation it appeared to be a healing surgical incision  Colonic ileus and Colitis  -Noted on x-ray 07/05/2023 and subsequently confirmed on CT scans. -Patient seen in consultation by GI who recommended conservative measures for now with repletion of electrolytes to keep potassium approximately 4, magnesium approximately 2. -Patient with some clinical improvement, abdomen less distended, softer, diffusely nontender to palpation with large watery stools. -MiraLAX has been decreased to daily but will stop today and also hold and stop Reglan.   -Full liquid diet going back to Dysphagia 1 Diet but had issues last night and was not eating very well this AM so will repeat CT Abd/Pelvis without Contrast  -Repeat CT Scan Abd/Pelvis without Contrast done and showed "Right-sided pleural effusion with volume loss or consolidation at the right base. Cardiomegaly. Left kidney cyst. Thickening of the wall of the cecum and ascending colon consistent with colitis. Thickening of the wall of the rectum, and a mucosal process is not excluded. Endoscopic correlation should be considered. Right inguinal canal soft tissue density structure could  be undescended right testis. Scrotal ultrasound correlation would be helpful. Aortic atherosclerosis." -IV fluids now stopped and timed out  -Will resume Abx with IV Zosyn given  -Rectal tube in place. -Palliative care consulted and DNR recommended to legal guardian. -Frequent  turns. -Will need to reengage GI since he has no continued clinical improvement; Was having quite a bit of diarreha and ? Overflow now -Repeat KUB yesterday showed "Scattered large and small bowel gas is noted. Persistent dilatation of a central sigmoid loop is seen and stable. No free air is noted. No new focal dilatation is seen."   New onset A-fib with RVR/HOCM -Patient during the hospitalization to have new onset A-fib with RVR with CHADS2Vasc score of 4. -Patient seen in consultation by cardiology was on metoprolol 100 mg 4 times daily subsequently changed to Toprol-XL. -Patient placed back on IV Lopressor due to colonic ileus. -Was on Eliquis for anticoagulation now on IV heparin due to ileus. -IV heparin has abeen transitioned to full dose Lovenox. -Likely resume Eliquis once ileus has fully resolved .   Acute Metabolic Encephalopathy. History of Dementia. Acute Right MCA Stroke. atient noted to have a progressive worsening mentation early on in the hospitalization. -CT head done 06/24/2023 with acute/subacute cortical and subcortical right MCA territory infarct. -MRI brain done consistent with acute infarct. -MRA brain done with no acute abnormalities. -Carotid Dopplers done with no significant ICA stenosis. -EEG done negative for seizures. -Repeat head CT done on 07/01/2023 per neurology recommendations showed no hemorrhagic conversion and as such neurology recommended resumption of anticoagulation with Eliquis. -Patient being followed by SLP was on dysphagia 1 diet however had to be placed on full liquids due to colonic ileus but will go back to Dysphagia 1 Diet -PT/OT following and recommending SNF placement.   History of Seizures. -EEG done negative for seizures/epileptiform activity. -Depakote on hold due to hepatic function. -Was on Keppra which is currently on hold but will resume today -Seizure Precautions   Moderate MR -Continue to Monitor volume status.  Dysphagia  due to Stroke. -Secondary to acute CVA. -Being followed by SLP, underwent MBS was on a dysphagia 1 diet and subsequently downgraded to clear liquids due to colonic ileus and now has been advanced to a full liquid diet.   -Once ileus resolved will resume dysphagia 1 diet. -SLP following.  Electrolyte Abnormalities including Severe Hypokalemia/Hypocalcemia/Hypophosphatemia/Hypomagnesemia -Electrolyte Trend: Recent Labs  Lab 07/13/23 0319 07/13/23 1410 07/14/23 0313 07/15/23 0327 07/16/23 0349 07/17/23 0215 07/18/23 0530  K 2.4* 2.5* 2.3* 2.4* 2.7* 2.6* 2.4*  CALCIUM 7.6* 7.8* 7.5* 7.3* 7.4* 7.2* 7.4*  PHOS 2.1*  --  2.6 1.9* 2.1* 2.1* 2.5  MG 1.8  --  1.8 2.2 2.1 2.1 1.9  -Replete with po KCL 40 mEQ BID x2, IV KCL 60 mEQ; NS + 20 mEQ Kcl at 40 mL/hr now stopped -Continue to Monitor and Replete as Necessary    Hypernatremia, improved  -Likely secondary to hypovolemic hypernatremia secondary to dehydration. -Na+ Trend: Recent Labs  Lab 07/13/23 0319 07/13/23 1410 07/14/23 0313 07/15/23 0327 07/16/23 0349 07/17/23 0215 07/18/23 0530  NA 138 139 137 138 140 136 141  -Improved with hydration and change of IV fluids to D5W. -IVF with NS + 20 mEQ Kcl at 40 mL/hr x1 Day now stopped -Repeat labs in the AM.   History of Syphilis. -RPR reactive for 2 years. -Dr. Allena Katz discussed with ID and patient currently being treated with Bicillin on a weekly basis.  Received a dose  07/09/2023.   AKI on CKD 3B Non Anion Gap Metabolic Acidosis -BUN/Cr Trend: Recent Labs  Lab 07/13/23 0319 07/13/23 1410 07/14/23 0313 07/15/23 0327 07/16/23 0349 07/17/23 0215 07/18/23 0530  BUN 5* 5* <5* <5* <5* 5* <5*  CREATININE 1.18 1.19 1.12 1.25* 1.34* 1.25* 1.10  -Bicarbonate gtt discontinued; Acidosis now shows a CO2 is now 19, anion gap is 6, chloride level was 115 -Will start sodium bicarb tabs 650 mg p.o. twice daily and resume IVF as above -Avoid Nephrotoxic Medications, Contrast Dyes,  Hypotension and Dehydration to Ensure Adequate Renal Perfusion and will need to Renally Adjust Meds -Continue to Monitor and Trend Renal Function carefully and repeat CMP in the AM   CAD -Patient not on DAPT or antiplatelets secondary to being on Eliquis. -Was on IV heparin due to ileus but transitioned to Lovenox.  -Outpatient follow-up with cardiology.   Bipolar Disorder -Not on any medications prior to admission. -Stable.  Normocytic Anemia -Hgb/Hct Trend: Recent Labs  Lab 07/12/23 0154 07/13/23 0319 07/14/23 0313 07/15/23 0327 07/16/23 1213 07/17/23 0215 07/18/23 0530  HGB 10.0* 9.3* 8.7* 8.9* 9.2* 8.9* 9.1*  HCT 31.1* 28.9* 26.4* 27.6* 28.1* 27.8* 28.6*  MCV 90.4 90.0 89.8 89.6 89.8 90.3 90.8  -Checked Anemia Panel and showed an iron level of 37, UIBC of 88, TIBC of 125, saturation ratio 30%, ferritin level 147, folate level 12.0, vitamin B12 569 -Continue to Monitor for S/Sx of Bleeding; No overt bleeding noted -Repeat CBC in the AM   Type II DM, uncontrolled with hyperglycemia. -Currently on sliding scale insulin. -CBG Trend: Recent Labs  Lab 07/17/23 2121 07/18/23 0019 07/18/23 0434 07/18/23 0725 07/18/23 1204 07/18/23 1527 07/18/23 2007  GLUCAP 77 73 80 65* 75 79 75   Poor p.o. Intake. -Likely multifactorial secondary to acute CVA and colonic ileus. -Was initially placed on Marinol which was currently on hold due to ileus. -Tolerating full liquid diet and will go back to Dysphagia 1 Diet and was tolerating this well up until last night and this AM and didn't want to eat -Will repeat CT Scan of the Abd/Pelvis   Thrush Possible pill induced Gastritis. -Patient completed course of antibiotics. -Was on Diflucan through 07/08/2023 and Thrush worsened now. -Status post completion of nystatin swish and swallow but will resume -Nystatin and Fluconazole ordered again  Goals of Care Conversation. -Dr. Allena Katz discussed with legal guardian on the phone. -Patient  noted with poor prognosis due to large right MCA infarct with deficits and dysphagia with dysarthria. -Patient noted with poor oral intake and now with colonic ileus currently under conservative treatment. -It is felt with acute CVA patient likely will not return to his prior status prior to admission. -It is felt that the patient has a cardiac arrest prognosis remains very poor to return to his prior status prior to arrest. -Patient with poor quality of life and Dr. Allena Katz recommended legal guardian to consider DNR for patient. -Palliative care following.  Hypoalbuminemia -Patient's Albumin Trend: Recent Labs  Lab 07/12/23 0154 07/13/23 0319 07/14/23 0313 07/15/23 0327 07/16/23 0349 07/17/23 0215 07/18/23 0530  ALBUMIN 2.2* 2.4* 3.3* 2.8* 2.5* 2.6* 2.6*  -Continue to Monitor and Trend and repeat CMP in the AM  Obesity -Complicates overall prognosis and care -Estimated body mass index is 30.38 kg/m as calculated from the following:   Height as of this encounter: 5\' 5"  (1.651 m).   Weight as of this encounter: 82.8 kg.  -Weight Loss and Dietary Counseling given   DVT  prophylaxis: SCDs Start: 06/15/23 1459    Code Status: Limited: Do not attempt resuscitation (DNR) -DNR-LIMITED -Do Not Intubate/DNI  Family Communication: No family present at bedside  Disposition Plan:  Level of care: Telemetry Status is: Inpatient Remains inpatient appropriate because: Needs further clinical improvement and needs improvement in the ileus and Colitis   Consultants:  General surgery: Dr. Derrell Lolling 06/18/2023 Gastroenterology: Dr. Ewing Schlein 07/05/2023 Cardiology: Dr. Royann Shivers 06/23/2023 Nephrology: Dr. Ronalee Belts 06/24/2023 Neurology: Dr.Bhagat 06/24/2023 Palliative care: Dr. Patterson Hammersmith 06/25/2023  Procedures:  Repeat CT Abd/Pelvis ordered  PICC line placement 07/08/2023 CT abdomen and pelvis 07/05/2023 CT head 06/15/2023, 06/24/2023, 07/01/2023 Chest x-ray 06/15/2023, 06/17/2023 Modified barium swallow  06/19/2023 Abdominal films 07/05/2023, 07/06/2023, 07/08/2023, 07/09/2023, 07/10/2023, 07/12/2023 MRI brain 06/25/2023 MRA head 06/25/2023 Right upper quadrant ultrasound 06/22/2023 2D echo 06/16/2023 Carotid Dopplers 06/25/2023 CT chest abdomen and pelvis 06/17/2023 EEG 06/25/2023 Irrigation and debridement perirectal abscess per general surgery: Dr. Derrell Lolling 06/18/2023  Antimicrobials:  Anti-infectives (From admission, onward)    Start     Dose/Rate Route Frequency Ordered Stop   07/18/23 1830  piperacillin-tazobactam (ZOSYN) IVPB 3.375 g        3.375 g 12.5 mL/hr over 240 Minutes Intravenous Every 8 hours 07/18/23 1808     07/16/23 1300  fluconazole (DIFLUCAN) tablet 100 mg        100 mg Oral Daily 07/16/23 1154     07/05/23 1600  fluconazole (DIFLUCAN) IVPB 200 mg        200 mg 100 mL/hr over 60 Minutes Intravenous Every 24 hours 07/05/23 1320 07/08/23 1658   07/02/23 1000  fluconazole (DIFLUCAN) tablet 100 mg  Status:  Discontinued        100 mg Oral Daily 07/02/23 0802 07/05/23 1320   07/02/23 0800  penicillin g benzathine (BICILLIN LA) 1200000 UNIT/2ML injection 2.4 Million Units        2.4 Million Units Intramuscular Weekly 07/01/23 1209 07/09/23 0950   06/26/23 1500  penicillin g benzathine (BICILLIN LA) 1200000 UNIT/2ML injection 2.4 Million Units        2.4 Million Units Intramuscular  Once 06/26/23 1351 06/28/23 0018   06/24/23 2200  amoxicillin-clavulanate (AUGMENTIN) 500-125 MG per tablet 1 tablet  Status:  Discontinued        1 tablet Oral Every 12 hours 06/24/23 1450 07/03/23 1202   06/24/23 2200  amoxicillin-clavulanate (AUGMENTIN) 500-125 MG per tablet 1 tablet  Status:  Discontinued        1 tablet Oral 2 times daily 06/24/23 1451 06/24/23 1451   06/21/23 1215  doxycycline (VIBRA-TABS) tablet 100 mg  Status:  Discontinued        100 mg Oral Every 12 hours 06/21/23 1128 07/03/23 1202   06/21/23 1215  amoxicillin-clavulanate (AUGMENTIN) 875-125 MG per tablet 1 tablet   Status:  Discontinued        1 tablet Oral Every 12 hours 06/21/23 1128 06/24/23 1450   06/20/23 1200  vancomycin (VANCOREADY) IVPB 1500 mg/300 mL  Status:  Discontinued        1,500 mg 150 mL/hr over 120 Minutes Intravenous Every 36 hours 06/20/23 0719 06/21/23 1128   06/19/23 0000  vancomycin (VANCOREADY) IVPB 1250 mg/250 mL  Status:  Discontinued        1,250 mg 166.7 mL/hr over 90 Minutes Intravenous Every 36 hours 06/18/23 0939 06/20/23 0719   06/18/23 1800  ceFEPIme (MAXIPIME) 2 g in sodium chloride 0.9 % 100 mL IVPB  Status:  Discontinued        2 g  200 mL/hr over 30 Minutes Intravenous Every 12 hours 06/18/23 0939 06/21/23 1128   06/18/23 0600  ceFEPIme (MAXIPIME) 2 g in sodium chloride 0.9 % 100 mL IVPB  Status:  Discontinued        2 g 200 mL/hr over 30 Minutes Intravenous Every 24 hours 06/17/23 0920 06/18/23 0939   06/17/23 1400  vancomycin (VANCOCIN) IVPB 1000 mg/200 mL premix  Status:  Discontinued        1,000 mg 200 mL/hr over 60 Minutes Intravenous Every 36 hours 06/17/23 0920 06/18/23 0939   06/16/23 1800  vancomycin (VANCOREADY) IVPB 750 mg/150 mL  Status:  Discontinued        750 mg 150 mL/hr over 60 Minutes Intravenous Every 24 hours 06/15/23 1639 06/17/23 0920   06/16/23 1100  cefTRIAXone (ROCEPHIN) 2 g in sodium chloride 0.9 % 100 mL IVPB  Status:  Discontinued        2 g 200 mL/hr over 30 Minutes Intravenous Every 24 hours 06/15/23 1453 06/15/23 1455   06/15/23 1800  ceFEPIme (MAXIPIME) 2 g in sodium chloride 0.9 % 100 mL IVPB  Status:  Discontinued        2 g 200 mL/hr over 30 Minutes Intravenous Every 12 hours 06/15/23 1558 06/17/23 0920   06/15/23 1700  metroNIDAZOLE (FLAGYL) IVPB 500 mg  Status:  Discontinued        500 mg 100 mL/hr over 60 Minutes Intravenous Every 12 hours 06/15/23 1455 06/21/23 1128   06/15/23 1630  vancomycin (VANCOREADY) IVPB 1500 mg/300 mL        1,500 mg 150 mL/hr over 120 Minutes Intravenous  Once 06/15/23 1558 06/15/23 2058    06/15/23 1045  cefTRIAXone (ROCEPHIN) 2 g in sodium chloride 0.9 % 100 mL IVPB        2 g 200 mL/hr over 30 Minutes Intravenous  Once 06/15/23 1041 06/15/23 1155       Subjective: Seen and examined at bedside continues to be withdrawn. Going for his CT Scan today. No complaints and denies any CP or SOB. No other concerns or complaints at this time.   Objective: Vitals:   07/18/23 0432 07/18/23 0500 07/18/23 1119 07/18/23 2005  BP: (!) 147/84  129/88 (!) 153/70  Pulse: 70  (!) 101 94  Resp: 18   20  Temp: 98.7 F (37.1 C)  97.6 F (36.4 C) 98.5 F (36.9 C)  TempSrc: Oral  Oral Oral  SpO2: 100%  99% 99%  Weight:  82.8 kg    Height:        Intake/Output Summary (Last 24 hours) at 07/18/2023 2108 Last data filed at 07/18/2023 2003 Gross per 24 hour  Intake 1306.73 ml  Output 1650 ml  Net -343.27 ml   Filed Weights   07/16/23 0500 07/17/23 0427 07/18/23 0500  Weight: 85.3 kg 83.7 kg 82.8 kg   Examination: Physical Exam:  Constitutional: Ill-appearing African-American male in NAD but appears withdrawn  Respiratory: Diminished to auscultation bilaterally, no wheezing, rales, rhonchi or crackles. Normal respiratory effort and patient is not tachypenic. No accessory muscle use. Unlabored breathing  Cardiovascular: RRR, no murmurs / rubs / gallops. S1 and S2 auscultated. No extremity edema.  Abdomen: Soft, non-tender, distended secondary to body habitus and his bowel sounds are hyperactive today.  GU: Deferred. Musculoskeletal: No clubbing / cyanosis of digits/nails. No joint deformity upper and lower extremities.  Neurologic: Has a little facial droop and a little dysarthria. Psychiatric: Appears a little withdrawn  Data Reviewed: I  have personally reviewed following labs and imaging studies  CBC: Recent Labs  Lab 07/14/23 0313 07/15/23 0327 07/16/23 1213 07/17/23 0215 07/18/23 0530  WBC 5.7 4.8 4.4 5.4 4.9  NEUTROABS  --  2.8 2.4 3.1 2.9  HGB 8.7* 8.9* 9.2* 8.9*  9.1*  HCT 26.4* 27.6* 28.1* 27.8* 28.6*  MCV 89.8 89.6 89.8 90.3 90.8  PLT 171 158 190 197 208   Basic Metabolic Panel: Recent Labs  Lab 07/14/23 0313 07/15/23 0327 07/16/23 0349 07/17/23 0215 07/18/23 0530  NA 137 138 140 136 141  K 2.3* 2.4* 2.7* 2.6* 2.4*  CL 110 111 115* 112* 114*  CO2 21* 20* 19* 19* 20*  GLUCOSE 106* 97 101* 132* 78  BUN <5* <5* <5* 5* <5*  CREATININE 1.12 1.25* 1.34* 1.25* 1.10  CALCIUM 7.5* 7.3* 7.4* 7.2* 7.4*  MG 1.8 2.2 2.1 2.1 1.9  PHOS 2.6 1.9* 2.1* 2.1* 2.5   GFR: Estimated Creatinine Clearance: 54.8 mL/min (by C-G formula based on SCr of 1.1 mg/dL). Liver Function Tests: Recent Labs  Lab 07/14/23 0313 07/15/23 0327 07/16/23 0349 07/17/23 0215 07/18/23 0530  AST  --  32 27 33 34  ALT  --  25 23 26 27   ALKPHOS  --  27* 25* 27* 30*  BILITOT  --  0.6 0.6 0.5 0.5  PROT  --  5.7* 5.8* 5.9* 6.0*  ALBUMIN 3.3* 2.8* 2.5* 2.6* 2.6*   No results for input(s): "LIPASE", "AMYLASE" in the last 168 hours. No results for input(s): "AMMONIA" in the last 168 hours. Coagulation Profile: No results for input(s): "INR", "PROTIME" in the last 168 hours. Cardiac Enzymes: No results for input(s): "CKTOTAL", "CKMB", "CKMBINDEX", "TROPONINI" in the last 168 hours. BNP (last 3 results) No results for input(s): "PROBNP" in the last 8760 hours. HbA1C: No results for input(s): "HGBA1C" in the last 72 hours. CBG: Recent Labs  Lab 07/18/23 0434 07/18/23 0725 07/18/23 1204 07/18/23 1527 07/18/23 2007  GLUCAP 80 65* 75 79 75   Lipid Profile: No results for input(s): "CHOL", "HDL", "LDLCALC", "TRIG", "CHOLHDL", "LDLDIRECT" in the last 72 hours. Thyroid Function Tests: No results for input(s): "TSH", "T4TOTAL", "FREET4", "T3FREE", "THYROIDAB" in the last 72 hours. Anemia Panel: Recent Labs    07/16/23 0349 07/16/23 1213  VITAMINB12 569  --   FOLATE 12.0  --   FERRITIN 147  --   TIBC 125*  --   IRON 37*  --   RETICCTPCT  --  1.8   Sepsis  Labs: No results for input(s): "PROCALCITON", "LATICACIDVEN" in the last 168 hours.  No results found for this or any previous visit (from the past 240 hour(s)).   Radiology Studies: CT ABDOMEN PELVIS WO CONTRAST  Result Date: 07/18/2023 CLINICAL DATA:  Abdominal pain. EXAM: CT ABDOMEN AND PELVIS WITHOUT CONTRAST TECHNIQUE: Multidetector CT imaging of the abdomen and pelvis was performed following the standard protocol without IV contrast. RADIATION DOSE REDUCTION: This exam was performed according to the departmental dose-optimization program which includes automated exposure control, adjustment of the mA and/or kV according to patient size and/or use of iterative reconstruction technique. COMPARISON:  07/05/2023. FINDINGS: Lower chest: Small right-sided pleural effusion. Volume loss or consolidation at the right base. Cardiomegaly. Atheromatous calcifications. Trace pericardial effusion or pericardial thickening. Hepatobiliary: No focal liver abnormality is seen. No gallstones, gallbladder wall thickening, or biliary dilatation. Pancreas: Unremarkable. No pancreatic ductal dilatation or surrounding inflammatory changes. Spleen: Normal in size without focal abnormality. Adrenals/Urinary Tract: No adrenal lesions. Left kidney cyst  measures 2.8 cm. No follow up imaging recommended. No hydronephrosis or nephrolithiasis. Unremarkable urinary bladder. Stomach/Bowel: Stomach is unremarkable. No small bowel dilatation. There is marked thickening of the wall of the cecum and ascending colon consistent with colitis. There is dilatation of the transverse colon consistent with ileus. There is thickening of the wall of the rectum, endo mucosal process is not excluded. Vascular/Lymphatic: Aortic atherosclerosis. No enlarged abdominal or pelvic lymph nodes. Reproductive: Prostate is unremarkable. Right inguinal canal soft tissue density structure could be undescended right testis Other: No abdominal wall hernia or  abnormality. No abdominopelvic ascites. Musculoskeletal: No acute or significant osseous findings. IMPRESSION: 1. Right-sided pleural effusion with volume loss or consolidation at the right base. 2. Cardiomegaly. 3. Left kidney cyst. 4. Thickening of the wall of the cecum and ascending colon consistent with colitis. 5. Thickening of the wall of the rectum, and a mucosal process is not excluded. Endoscopic correlation should be considered. 6. Right inguinal canal soft tissue density structure could be undescended right testis. Scrotal ultrasound correlation would be helpful. 7. Aortic atherosclerosis. Aortic Atherosclerosis (ICD10-I70.0). Electronically Signed   By: Layla Maw M.D.   On: 07/18/2023 16:44    Scheduled Meds:  Chlorhexidine Gluconate Cloth  6 each Topical Daily   enoxaparin (LOVENOX) injection  80 mg Subcutaneous Q12H   fluconazole  100 mg Oral Daily   insulin glargine-yfgn  8 Units Subcutaneous QHS   levETIRAcetam  500 mg Oral BID   magic mouthwash  10 mL Oral QID   metoprolol tartrate  25 mg Oral BID   nystatin  5 mL Oral QID   mouth rinse  15 mL Mouth Rinse 4 times per day   pantoprazole  40 mg Oral Daily   PARoxetine  10 mg Oral Daily   potassium chloride  40 mEq Oral BID   simethicone  40 mg Oral QID   simvastatin  20 mg Oral QHS   sodium bicarbonate  650 mg Oral BID   sodium chloride flush  3 mL Intravenous Q12H   Continuous Infusions:  piperacillin-tazobactam (ZOSYN)  IV 3.375 g (07/18/23 1814)    LOS: 33 days   Marguerita Merles, DO [facility] Available via Epic secure chat 7am-7pm After these hours, please refer to coverage provider listed on [email or web address] 07/18/2023, 9:08 PM

## 2023-07-18 NOTE — Plan of Care (Signed)
  Problem: Coping: Goal: Ability to adjust to condition or change in health will improve Outcome: Progressing   Problem: Fluid Volume: Goal: Ability to maintain a balanced intake and output will improve Outcome: Progressing   Problem: Metabolic: Goal: Ability to maintain appropriate glucose levels will improve Outcome: Progressing   Problem: Clinical Measurements: Goal: Will remain free from infection Outcome: Progressing Goal: Cardiovascular complication will be avoided Outcome: Progressing   Problem: Elimination: Goal: Will not experience complications related to urinary retention Outcome: Progressing   Problem: Safety: Goal: Ability to remain free from injury will improve Outcome: Progressing   Problem: Ischemic Stroke/TIA Tissue Perfusion: Goal: Complications of ischemic stroke/TIA will be minimized Outcome: Progressing

## 2023-07-19 DIAGNOSIS — R1310 Dysphagia, unspecified: Secondary | ICD-10-CM | POA: Diagnosis not present

## 2023-07-19 DIAGNOSIS — I2510 Atherosclerotic heart disease of native coronary artery without angina pectoris: Secondary | ICD-10-CM | POA: Diagnosis not present

## 2023-07-19 DIAGNOSIS — E1110 Type 2 diabetes mellitus with ketoacidosis without coma: Secondary | ICD-10-CM | POA: Diagnosis not present

## 2023-07-19 DIAGNOSIS — R6520 Severe sepsis without septic shock: Secondary | ICD-10-CM | POA: Diagnosis not present

## 2023-07-19 DIAGNOSIS — A419 Sepsis, unspecified organism: Secondary | ICD-10-CM | POA: Diagnosis not present

## 2023-07-19 LAB — COMPREHENSIVE METABOLIC PANEL
ALT: 27 U/L (ref 0–44)
AST: 33 U/L (ref 15–41)
Albumin: 2.6 g/dL — ABNORMAL LOW (ref 3.5–5.0)
Alkaline Phosphatase: 25 U/L — ABNORMAL LOW (ref 38–126)
Anion gap: 8 (ref 5–15)
BUN: <5 mg/dL — ABNORMAL LOW (ref 8–23)
CO2: 21 mmol/L — ABNORMAL LOW (ref 22–32)
Calcium: 7.3 mg/dL — ABNORMAL LOW (ref 8.9–10.3)
Chloride: 110 mmol/L (ref 98–111)
Creatinine, Ser: 1.08 mg/dL (ref 0.61–1.24)
GFR, Estimated: >60 mL/min (ref >60)
Glucose, Bld: 60 mg/dL — ABNORMAL LOW (ref 70–99)
Potassium: 2.1 mmol/L — CL (ref 3.5–5.1)
Sodium: 139 mmol/L (ref 135–145)
Total Bilirubin: 0.7 mg/dL (ref <1.2)
Total Protein: 6 g/dL — ABNORMAL LOW (ref 6.5–8.1)

## 2023-07-19 LAB — GLUCOSE, CAPILLARY
Glucose-Capillary: 116 mg/dL — ABNORMAL HIGH (ref 70–99)
Glucose-Capillary: 119 mg/dL — ABNORMAL HIGH (ref 70–99)
Glucose-Capillary: 151 mg/dL — ABNORMAL HIGH (ref 70–99)
Glucose-Capillary: 60 mg/dL — ABNORMAL LOW (ref 70–99)
Glucose-Capillary: 64 mg/dL — ABNORMAL LOW (ref 70–99)
Glucose-Capillary: 73 mg/dL (ref 70–99)
Glucose-Capillary: 92 mg/dL (ref 70–99)

## 2023-07-19 LAB — MAGNESIUM: Magnesium: 1.9 mg/dL (ref 1.7–2.4)

## 2023-07-19 LAB — C DIFFICILE (CDIFF) QUICK SCRN (NO PCR REFLEX)
C Diff antigen: NEGATIVE
C Diff interpretation: NOT DETECTED
C Diff toxin: NEGATIVE

## 2023-07-19 LAB — GASTROINTESTINAL PANEL BY PCR, STOOL (REPLACES STOOL CULTURE)

## 2023-07-19 LAB — CBC WITH DIFFERENTIAL/PLATELET
Abs Immature Granulocytes: 0.01 10*3/uL (ref 0.00–0.07)
Basophils Absolute: 0 10*3/uL (ref 0.0–0.1)
Basophils Relative: 1 %
Eosinophils Absolute: 0.2 10*3/uL (ref 0.0–0.5)
Eosinophils Relative: 5 %
HCT: 27.8 % — ABNORMAL LOW (ref 39.0–52.0)
Hemoglobin: 8.9 g/dL — ABNORMAL LOW (ref 13.0–17.0)
Immature Granulocytes: 0 %
Lymphocytes Relative: 22 %
Lymphs Abs: 1.1 10*3/uL (ref 0.7–4.0)
MCH: 28.2 pg (ref 26.0–34.0)
MCHC: 32 g/dL (ref 30.0–36.0)
MCV: 88 fL (ref 80.0–100.0)
Monocytes Absolute: 0.5 10*3/uL (ref 0.1–1.0)
Monocytes Relative: 10 %
Neutro Abs: 3.1 10*3/uL (ref 1.7–7.7)
Neutrophils Relative %: 62 %
Platelets: 227 10*3/uL (ref 150–400)
RBC: 3.16 MIL/uL — ABNORMAL LOW (ref 4.22–5.81)
RDW: 14.6 % (ref 11.5–15.5)
WBC: 5 10*3/uL (ref 4.0–10.5)
nRBC: 0 % (ref 0.0–0.2)

## 2023-07-19 LAB — PHOSPHORUS: Phosphorus: 2 mg/dL — ABNORMAL LOW (ref 2.5–4.6)

## 2023-07-19 MED ORDER — DEXTROSE-SODIUM CHLORIDE 5-0.9 % IV SOLN: INTRAVENOUS | Status: AC

## 2023-07-19 MED ORDER — ORAL CARE MOUTH RINSE: 15.0000 mL | OROMUCOSAL | Status: DC | PRN

## 2023-07-19 MED ORDER — POTASSIUM CHLORIDE 10 MEQ/100ML IV SOLN
10.0000 meq | INTRAVENOUS | Status: AC
  Administered 2023-07-19 (×6): 10 meq via INTRAVENOUS
  Filled 2023-07-19: qty 100

## 2023-07-19 MED ORDER — ORAL CARE MOUTH RINSE
15.0000 mL | OROMUCOSAL | Status: DC
  Administered 2023-07-19 – 2023-08-02 (×53): 15 mL via OROMUCOSAL

## 2023-07-19 MED ORDER — POTASSIUM CHLORIDE 20 MEQ PO PACK
40.0000 meq | PACK | Freq: Two times a day (BID) | ORAL | Status: AC
  Administered 2023-07-19 (×2): 40 meq via ORAL
  Filled 2023-07-19 (×2): qty 2

## 2023-07-19 MED ORDER — FLUCONAZOLE 200 MG PO TABS
200.0000 mg | ORAL_TABLET | Freq: Every day | ORAL | Status: AC
  Administered 2023-07-20 – 2023-07-26 (×7): 200 mg via ORAL
  Filled 2023-07-19 (×7): qty 1

## 2023-07-19 MED ORDER — POTASSIUM CHLORIDE CRYS ER 20 MEQ PO TBCR: 40.0000 meq | EXTENDED_RELEASE_TABLET | Freq: Two times a day (BID) | ORAL | Status: DC

## 2023-07-19 MED ORDER — POTASSIUM PHOSPHATES 15 MMOLE/5ML IV SOLN
30.0000 mmol | Freq: Once | INTRAVENOUS | Status: AC
  Administered 2023-07-19: 30 mmol via INTRAVENOUS
  Filled 2023-07-19: qty 10

## 2023-07-19 MED ORDER — FLUCONAZOLE 100 MG PO TABS
100.0000 mg | ORAL_TABLET | Freq: Once | ORAL | Status: AC
  Administered 2023-07-19: 100 mg via ORAL
  Filled 2023-07-19: qty 1

## 2023-07-19 MED ORDER — DEXTROSE 50 % IV SOLN
INTRAVENOUS | Status: AC
  Administered 2023-07-19: 50 mL
  Filled 2023-07-19: qty 50

## 2023-07-19 MED ORDER — POTASSIUM CHLORIDE 10 MEQ/100ML IV SOLN
10.0000 meq | INTRAVENOUS | Status: AC
Start: 2023-07-19 — End: 2023-07-20
  Administered 2023-07-19 – 2023-07-20 (×6): 10 meq via INTRAVENOUS
  Filled 2023-07-19 (×6): qty 100

## 2023-07-19 NOTE — [place] Notes (Signed)
Pharmacy Antibiotic and Anticoagulation Note  Eric Zamora is a 77 y.o. male who is known to pharmacy from current lovenox consult. Pharmacy has been consulted on 07/16/23 to dose fluconazole for Oropharangeal candidiasis & consulted 11/17 to resume zosyn for IAI infection.  07/19/2023 Per RN pt still has severe oral thrush on D#4 po fluconazole & nystatin  SCr 1.08 D#2 zosyn,  GI ordered GI panel & C diff AF, WBC WNL   Plan: - increase fluconazole to 200 mg PO daily  - continue Zosyn 3.375 gm IV q8h, infuse each dose over 4 hours  He continues on LMWH for afib.  Continue current dose of 80mg  q12h  for now.  CBC stable, no bleeding reported ___________________________________________  Height: 5\' 5"  (165.1 cm) Weight: 82.5 kg (181 lb 14.1 oz) IBW/kg (Calculated) : 61.5  Temp (24hrs), Avg:98.4 F (36.9 C), Min:98.2 F (36.8 C), Max:98.5 F (36.9 C)  Recent Labs  Lab 07/15/23 0327 07/16/23 0349 07/16/23 1213 07/17/23 0215 07/18/23 0530 07/19/23 0514  WBC 4.8  --  4.4 5.4 4.9 5.0  CREATININE 1.25* 1.34*  --  1.25* 1.10 1.08    Estimated Creatinine Clearance: 55.7 mL/min (by C-G formula based on SCr of 1.08 mg/dL).    No Known Allergies  Antimicrobials this admission: 10/15 Cefepime >> 10/21 10/15 Flagyl >> 10/21  10/15 Vancomycin >> 10/21 10/21 Doxy >> 11/3 10/21 Augmentin >> 11/3 11/1 Fluconazole >> 11/7, resumed 11/15 fluconazole >>  11/15 po nystatin>>  11/17 Zosyn >>    Microbiology results: No new cultures in past 30 days 10/15 BCx: ngF 10/15 MRSA PCR: positive 10/16 resp panel neg 10/17 BCx2: NGF  11/18 GI panel: 11/18 C diff:    Thank you for allowing pharmacy to be a part of this patient's care.   Herby Abraham, Pharm.D Use secure chat for questions 07/19/2023 11:50 AM

## 2023-07-19 NOTE — [place] Notes (Signed)
Subjective: Patient was seen as a consult by Dr. Ewing Schlein on 07/05/2023 for ileus. GI reconsulted by Dr. Marland Mcalpine for questions of ileus and colitis.  Patient noted slouching on bedside chair. He has a Flexi-Seal draining liquid yellow, watery stool.  Objective: Vital signs in last 24 hours: Temp:  [98.2 F (36.8 C)-98.5 F (36.9 C)] 98.2 F (36.8 C) (11/18 0603) Pulse Rate:  [63-94] 63 (11/18 0603) Resp:  [20] 20 (11/18 0603) BP: (131-153)/(56-70) 131/56 (11/18 0603) SpO2:  [99 %-100 %] 100 % (11/18 0603) Weight:  [82.5 kg] 82.5 kg (11/18 0500) Weight change: -0.3 kg Last BM Date : 07/17/23 (flexiseal)  PE: Deconditioned, nonicteric, slurred speech GENERAL: Nontoxic, not in distress  ABDOMEN: Distended, nontender, bowel sounds audible   Lab Results: Results for orders placed or performed during the hospital encounter of 06/15/23 (from the past 48 hour(s))  Glucose, capillary     Status: Abnormal   Collection Time: 07/17/23 11:53 AM  Result Value Ref Range   Glucose-Capillary 106 (H) 70 - 99 mg/dL    Comment: Glucose reference range applies only to samples taken after fasting for at least 8 hours.  Glucose, capillary     Status: None   Collection Time: 07/17/23  4:26 PM  Result Value Ref Range   Glucose-Capillary 95 70 - 99 mg/dL    Comment: Glucose reference range applies only to samples taken after fasting for at least 8 hours.  Glucose, capillary     Status: None   Collection Time: 07/17/23  9:21 PM  Result Value Ref Range   Glucose-Capillary 77 70 - 99 mg/dL    Comment: Glucose reference range applies only to samples taken after fasting for at least 8 hours.   Comment 1 Notify RN   Glucose, capillary     Status: None   Collection Time: 07/18/23 12:19 AM  Result Value Ref Range   Glucose-Capillary 73 70 - 99 mg/dL    Comment: Glucose reference range applies only to samples taken after fasting for at least 8 hours.   Comment 1 Notify RN   Glucose, capillary     Status:  None   Collection Time: 07/18/23  4:34 AM  Result Value Ref Range   Glucose-Capillary 80 70 - 99 mg/dL    Comment: Glucose reference range applies only to samples taken after fasting for at least 8 hours.   Comment 1 Notify RN   Comprehensive metabolic panel     Status: Abnormal   Collection Time: 07/18/23  5:30 AM  Result Value Ref Range   Sodium 141 135 - 145 mmol/L   Potassium 2.4 (LL) 3.5 - 5.1 mmol/L    Comment: CRITICAL RESULT CALLED TO, READ BACK BY AND VERIFIED WITH LYCETT,E. RN AT 2440 07/18/23 MULLINS,T    Chloride 114 (H) 98 - 111 mmol/L   CO2 20 (L) 22 - 32 mmol/L   Glucose, Bld 78 70 - 99 mg/dL    Comment: Glucose reference range applies only to samples taken after fasting for at least 8 hours.   BUN <5 (L) 8 - 23 mg/dL   Creatinine, Ser 1.02 0.61 - 1.24 mg/dL   Calcium 7.4 (L) 8.9 - 10.3 mg/dL   Total Protein 6.0 (L) 6.5 - 8.1 g/dL   Albumin 2.6 (L) 3.5 - 5.0 g/dL   AST 34 15 - 41 U/L   ALT 27 0 - 44 U/L   Alkaline Phosphatase 30 (L) 38 - 126 U/L   Total Bilirubin 0.5 <1.2 mg/dL  GFR, Estimated >60 >60 mL/min    Comment: (NOTE) Calculated using the CKD-EPI Creatinine Equation (2021)    Anion gap 7 5 - 15    Comment: Performed at [facility], [address], [place], [place] [address]  CBC with Differential/Platelet     Status: Abnormal   Collection Time: 07/18/23  5:30 AM  Result Value Ref Range   WBC 4.9 4.0 - 10.5 K/uL   RBC 3.15 (L) 4.22 - 5.81 MIL/uL   Hemoglobin 9.1 (L) 13.0 - 17.0 g/dL   HCT 19.1 (L) 47.8 - 29.5 %   MCV 90.8 80.0 - 100.0 fL   MCH 28.9 26.0 - 34.0 pg   MCHC 31.8 30.0 - 36.0 g/dL   RDW 62.1 30.8 - 65.7 %   Platelets 208 150 - 400 K/uL   nRBC 0.0 0.0 - 0.2 %   Neutrophils Relative % 58 %   Neutro Abs 2.9 1.7 - 7.7 K/uL   Lymphocytes Relative 25 %   Lymphs Abs 1.2 0.7 - 4.0 K/uL   Monocytes Relative 11 %   Monocytes Absolute 0.5 0.1 - 1.0 K/uL   Eosinophils Relative 5 %   Eosinophils Absolute 0.3 0.0 - 0.5 K/uL    Basophils Relative 1 %   Basophils Absolute 0.0 0.0 - 0.1 K/uL   Immature Granulocytes 0 %   Abs Immature Granulocytes 0.01 0.00 - 0.07 K/uL    Comment: Performed at [facility], [address], [place], [place] [address]  Magnesium     Status: None   Collection Time: 07/18/23  5:30 AM  Result Value Ref Range   Magnesium 1.9 1.7 - 2.4 mg/dL    Comment: Performed at [facility], [address], [place], [place] [address]  Phosphorus     Status: None   Collection Time: 07/18/23  5:30 AM  Result Value Ref Range   Phosphorus 2.5 2.5 - 4.6 mg/dL    Comment: Performed at [facility], [address] [place], [place] [address]  Glucose, capillary     Status: Abnormal   Collection Time: 07/18/23  7:25 AM  Result Value Ref Range   Glucose-Capillary 65 (L) 70 - 99 mg/dL    Comment: Glucose reference range applies only to samples taken after fasting for at least 8 hours.  Glucose, capillary     Status: None   Collection Time: 07/18/23 12:04 PM  Result Value Ref Range   Glucose-Capillary 75 70 - 99 mg/dL    Comment: Glucose reference range applies only to samples taken after fasting for at least 8 hours.  Glucose, capillary     Status: None   Collection Time: 07/18/23  3:27 PM  Result Value Ref Range   Glucose-Capillary 79 70 - 99 mg/dL    Comment: Glucose reference range applies only to samples taken after fasting for at least 8 hours.  Glucose, capillary     Status: None   Collection Time: 07/18/23  8:07 PM  Result Value Ref Range   Glucose-Capillary 75 70 - 99 mg/dL    Comment: Glucose reference range applies only to samples taken after fasting for at least 8 hours.  Glucose, capillary     Status: None   Collection Time: 07/19/23 12:02 AM  Result Value Ref Range   Glucose-Capillary 73 70 - 99 mg/dL    Comment: Glucose reference range applies only to samples taken after fasting for at least 8 hours.  Glucose, capillary  Status: Abnormal   Collection Time: 07/19/23  3:43 AM  Result Value Ref Range   Glucose-Capillary 64 (L) 70 - 99 mg/dL    Comment: Glucose reference range applies only to samples taken after fasting for at least 8 hours.  CBC with Differential/Platelet     Status: Abnormal   Collection Time: 07/19/23  5:14 AM  Result Value Ref Range   WBC 5.0 4.0 - 10.5 K/uL   RBC 3.16 (L) 4.22 - 5.81 MIL/uL   Hemoglobin 8.9 (L) 13.0 - 17.0 g/dL   HCT 16.1 (L) 09.6 - 04.5 %   MCV 88.0 80.0 - 100.0 fL   MCH 28.2 26.0 - 34.0 pg   MCHC 32.0 30.0 - 36.0 g/dL   RDW 40.9 81.1 - 91.4 %   Platelets 227 150 - 400 K/uL   nRBC 0.0 0.0 - 0.2 %   Neutrophils Relative % 62 %   Neutro Abs 3.1 1.7 - 7.7 K/uL   Lymphocytes Relative 22 %   Lymphs Abs 1.1 0.7 - 4.0 K/uL   Monocytes Relative 10 %   Monocytes Absolute 0.5 0.1 - 1.0 K/uL   Eosinophils Relative 5 %   Eosinophils Absolute 0.2 0.0 - 0.5 K/uL   Basophils Relative 1 %   Basophils Absolute 0.0 0.0 - 0.1 K/uL   Immature Granulocytes 0 %   Abs Immature Granulocytes 0.01 0.00 - 0.07 K/uL    Comment: Performed at [facility], [address], [place], [place] [address]  Comprehensive metabolic panel     Status: Abnormal   Collection Time: 07/19/23  5:14 AM  Result Value Ref Range   Sodium 139 135 - 145 mmol/L   Potassium 2.1 (LL) 3.5 - 5.1 mmol/L    Comment: CRITICAL RESULT CALLED TO, READ BACK BY AND VERIFIED WITH RYAN,T. RN AT 0701 07/19/23 MULLINS,T    Chloride 110 98 - 111 mmol/L   CO2 21 (L) 22 - 32 mmol/L   Glucose, Bld 60 (L) 70 - 99 mg/dL    Comment: Glucose reference range applies only to samples taken after fasting for at least 8 hours.   BUN <5 (L) 8 - 23 mg/dL   Creatinine, Ser 6.21 0.61 - 1.24 mg/dL   Calcium 7.3 (L) 8.9 - 10.3 mg/dL   Total Protein 6.0 (L) 6.5 - 8.1 g/dL   Albumin 2.6 (L) 3.5 - 5.0 g/dL   AST 33 15 - 41 U/L   ALT 27 0 - 44 U/L   Alkaline Phosphatase 25 (L) 38 - 126 U/L   Total Bilirubin 0.7 <1.2  mg/dL   GFR, Estimated >30 >86 mL/min    Comment: (NOTE) Calculated using the CKD-EPI Creatinine Equation (2021)    Anion gap 8 5 - 15    Comment: Performed at [facility], [address], [place], [place] [address]  Magnesium     Status: None   Collection Time: 07/19/23  5:14 AM  Result Value Ref Range   Magnesium 1.9 1.7 - 2.4 mg/dL    Comment: Performed at [facility], [address], [place], [place] [address]  Phosphorus     Status: Abnormal   Collection Time: 07/19/23  5:14 AM  Result Value Ref Range   Phosphorus 2.0 (L) 2.5 - 4.6 mg/dL    Comment: Performed at [facility], [address] [place], [place] [address]  Glucose, capillary     Status: Abnormal   Collection Time: 07/19/23  7:41 AM  Result Value Ref Range   Glucose-Capillary 60 (L) 70 - 99 mg/dL    Comment: Glucose reference range applies only to samples taken after fasting for at least 8 hours.  Glucose, capillary     Status: Abnormal   Collection Time: 07/19/23  8:20 AM  Result Value Ref Range   Glucose-Capillary 151 (H) 70 - 99 mg/dL    Comment: Glucose reference range applies only to samples taken after fasting for at least 8 hours.    Studies/Results: CT ABDOMEN PELVIS WO CONTRAST  Result Date: 07/18/2023 CLINICAL DATA:  Abdominal pain. EXAM: CT ABDOMEN AND PELVIS WITHOUT CONTRAST TECHNIQUE: Multidetector CT imaging of the abdomen and pelvis was performed following the standard protocol without IV contrast. RADIATION DOSE REDUCTION: This exam was performed according to the departmental dose-optimization program which includes automated exposure control, adjustment of the mA and/or kV according to patient size and/or use of iterative reconstruction technique. COMPARISON:  07/05/2023. FINDINGS: Lower chest: Small right-sided pleural effusion. Volume loss or consolidation at the right base. Cardiomegaly. Atheromatous calcifications. Trace pericardial  effusion or pericardial thickening. Hepatobiliary: No focal liver abnormality is seen. No gallstones, gallbladder wall thickening, or biliary dilatation. Pancreas: Unremarkable. No pancreatic ductal dilatation or surrounding inflammatory changes. Spleen: Normal in size without focal abnormality. Adrenals/Urinary Tract: No adrenal lesions. Left kidney cyst measures 2.8 cm. No follow up imaging recommended. No hydronephrosis or nephrolithiasis. Unremarkable urinary bladder. Stomach/Bowel: Stomach is unremarkable. No small bowel dilatation. There is marked thickening of the wall of the cecum and ascending colon consistent with colitis. There is dilatation of the transverse colon consistent with ileus. There is thickening of the wall of the rectum, endo mucosal process is not excluded. Vascular/Lymphatic: Aortic atherosclerosis. No enlarged abdominal or pelvic lymph nodes. Reproductive: Prostate is unremarkable. Right inguinal canal soft tissue density structure could be undescended right testis Other: No abdominal wall hernia or abnormality. No abdominopelvic ascites. Musculoskeletal: No acute or significant osseous findings. IMPRESSION: 1. Right-sided pleural effusion with volume loss or consolidation at the right base. 2. Cardiomegaly. 3. Left kidney cyst. 4. Thickening of the wall of the cecum and ascending colon consistent with colitis. 5. Thickening of the wall of the rectum, and a mucosal process is not excluded. Endoscopic correlation should be considered. 6. Right inguinal canal soft tissue density structure could be undescended right testis. Scrotal ultrasound correlation would be helpful. 7. Aortic atherosclerosis. Aortic Atherosclerosis (ICD10-I70.0). Electronically Signed   By: Layla Maw M.D.   On: 07/18/2023 16:44    Medications: I have reviewed the patient's current medications.  Assessment: Thickening of wall of cecum and ascending consistent with colitis Thickening of wall of rectum?   Mucosal process Dilation of transverse colon consistent with ileus  Severe hypokalemia, potassium 2.1 Acidosis, bicarb 21 Malnutrition, total protein 6, albumin 2.6 Low phosphorus, 2 Normocytic anemia, hemoglobin 8.9, MCV 88  Multiple comorbidities- severe sepsis with perirectal abscess status post I&D on 06/18/2023, drain placement with subsequent removal Stage II pressure ulcer Atrial fibrillation, on Lovenox History of dementia Acute right MCA stroke History of seizures Dysphagia Coronary artery disease Bipolar disorder  Plan: Colitis could be related to infectious process, recommend stool for GI pathogen panel and C. difficile testing(as patient has been on prolonged course of antibiotics) and has been resumed on IV Zosyn on 07/17/2023.  With generalized deconditioning and multiple comorbidities, patient is not a candidate for colonoscopy.  Rectal wall thickening could be related to recent drainage of perirectal abscess.  Right-sided colitis could be ischemic  in nature versus infectious.  Will need aggressive potassium replacement to keep potassium above 4.  Patient with significant loose stool, although has abdominal distention, without nausea vomiting and ongoing diarrhea, ileus unlikely.  Kerin Salen, MD 07/19/2023, 11:25 AM

## 2023-07-19 NOTE — [place] Notes (Signed)
Physical Therapy Treatment Patient Details Name: Eric Zamora MRN: [id number] DOB: 1945-12-25 Today's Date: 07/19/2023   History of Present Illness Pt is 77 yo admitted with severe sepsis due to perirectal abscess.  Pt to OR on 10/18 for drainage of abscess.  Pt with increased lethargy on 10/24 , MRI Head performed and pt found to have acute infarct  right frontal operculum with moderate associated cytotoxic edema and petechial hemorrhage. Pt found to have ileus 07/06/23 (still present 11/17) and with new onset afib during admisiion. PMH: bipolar disorder, dementia, history of NSTEMI, hypertrophic cardiomyopathy    PT Comments  Pt sliding down in chair and nursing requesting assist to get pt back to bed.  Pt with similar presentation from earlier , requiring mod x 2 for chair to bed transfers with cues. Cont POC. Patient will benefit from continued inpatient follow up therapy, <3 hours/day     If plan is discharge home, recommend the following: Two people to help with walking and/or transfers;Two people to help with bathing/dressing/bathroom;Assistance with cooking/housework;Direct supervision/assist for financial management;Assist for transportation;Help with stairs or ramp for entrance   Can travel by private vehicle     No  Equipment Recommendations  None recommended by PT    Recommendations for Other Services       Precautions / Restrictions Precautions Precautions: Fall Precaution Comments: monitor HR     Mobility  Bed Mobility Overal bed mobility: Needs Assistance Bed Mobility: Sit to Supine     Supine to sit: Mod assist Sit to supine: +2 for physical assistance, Mod assist   General bed mobility comments: assist for trunk and legs back to bed (more assist due to rectal tube leaking an dpt near EOB)    Transfers Overall transfer level: Needs assistance Equipment used: Rolling walker (2 wheels) Transfers: Sit to/from Stand Sit to Stand: Mod assist, +2 physical  assistance   Step pivot transfers: Mod assist, +2 physical assistance       General transfer comment: STS x 1 with mod x 2 and mod x 2 step pivot back to bed.  Assist for RW and multimodal cues for shifting feet    Ambulation/Gait               General Gait Details: only steps to chair   Stairs             Wheelchair Mobility     Tilt Bed    Modified Rankin (Stroke Patients Only) Modified Rankin (Stroke Patients Only) Pre-Morbid Rankin Score: Moderate disability Modified Rankin: Severe disability     Balance Overall balance assessment: Needs assistance Sitting-balance support: Feet supported, Single extremity supported Sitting balance-Leahy Scale: Poor Sitting balance - Comments: At least single UE support, could maintain static balance with slumped posture.  With challenges leans posteriorly requiring min A.  Not leaning R today.  Worked on reaching forward to targets prior to standing   Standing balance support: Bilateral upper extremity supported Standing balance-Leahy Scale: Poor Standing balance comment: Stood with RW and mod A of 2  but not leaning R today                            Cognition Arousal: Alert Behavior During Therapy: WFL for tasks assessed/performed Overall Cognitive Status: No family/caregiver present to determine baseline cognitive functioning  General Comments: alert, answers simple questions, slurred speach, follows commands with increased time and cuing        Exercises General Exercises - Lower Extremity Ankle Circles/Pumps: AROM, 10 reps, Supine, Both Quad Sets: AROM, Both, 10 reps, Supine Long Arc Quad: AROM, Both, Seated, 20 reps Heel Slides: AAROM, Both, 10 reps, Supine Straight Leg Raises: AROM, Both, Supine, 10 reps Other Exercises Other Exercises: multimodal cues for quad sets; cues to take time and full ROM with all exercises (tends to do partial and quick  movements)    General Comments General comments (skin integrity, edema, etc.): HR 128 bpm max today.      Pertinent Vitals/Pain Pain Assessment Pain Assessment: No/denies pain    Home Living                          Prior Function            PT Goals (current goals can now be found in the care plan section) [place] towards PT goals: Progressing toward goals    Frequency    Min 1X/week      PT Plan      Co-evaluation              AM-PAC PT "6 Clicks" Mobility   Outcome Measure  Help needed turning from your back to your side while in a flat bed without using bedrails?: A Lot Help needed moving from lying on your back to sitting on the side of a flat bed without using bedrails?: A Lot Help needed moving to and from a bed to a chair (including a wheelchair)?: Total Help needed standing up from a chair using your arms (e.g., wheelchair or bedside chair)?: Total Help needed to walk in hospital room?: Total Help needed climbing 3-5 steps with a railing? : Total 6 Click Score: 8    End of Session Equipment Utilized During Treatment: Gait belt Activity Tolerance: Patient tolerated treatment well Patient left: in bed;with call bell/phone within reach;with bed alarm set;with nursing/sitter in room Nurse Communication: Mobility status;Need for lift equipment PT Visit Diagnosis: Other abnormalities of gait and mobility (R26.89);Muscle weakness (generalized) (M62.81)     Time: 9811-9147 PT Time Calculation (min) (ACUTE ONLY): 13 min  Charges:    $Therapeutic Activity: 8-22 mins PT General Charges $$ ACUTE PT VISIT: 1 Visit                     Anise Salvo, PT Acute Rehab Services [facility] [phone]    Rayetta Humphrey 07/19/2023, 1:08 PM

## 2023-07-19 NOTE — [place] Notes (Signed)
SLP Cancellation Note  Patient Details Name: Eric Zamora MRN: [id number] DOB: 07/07/1946   Cancelled treatment:       Reason Eval/Treat Not Completed: Patient declined, no reason specified Patient declined PO's x2 today with SLP attempting in AM and then lunchtime. SLP will continue efforts.    Angela Nevin, MA, CCC-SLP Speech Therapy

## 2023-07-19 NOTE — Progress Notes (Signed)
Daily Progress Note   Patient Name: Eric Zamora       Date: 07/19/2023 DOB: [birth year]  Age: 77 y.o. MRN#: [id number] Attending Physician: Merlene Laughter, DO Primary Care Physician: Uvaldo Bristle, PA-C Admit Date: 06/15/2023  Reason for Consultation/Follow-up: Establishing goals of care  Subjective: Awake alert Sitting in chair Has dysarthric speech however later is able to answer all questions asked appropriately, denies pain has a Flexi-seal draining liquid yellow watery stool.  Length of Stay: 34  Current Medications: Scheduled Meds:   Chlorhexidine Gluconate Cloth  6 each Topical Daily   enoxaparin (LOVENOX) injection  80 mg Subcutaneous Q12H   [START ON 07/20/2023] fluconazole  200 mg Oral Daily   insulin glargine-yfgn  8 Units Subcutaneous QHS   levETIRAcetam  500 mg Oral BID   magic mouthwash  10 mL Oral QID   metoprolol tartrate  25 mg Oral BID   nystatin  5 mL Oral QID   mouth rinse  15 mL Mouth Rinse 4 times per day   pantoprazole  40 mg Oral Daily   PARoxetine  10 mg Oral Daily   potassium chloride  40 mEq Oral BID   simethicone  40 mg Oral QID   simvastatin  20 mg Oral QHS   sodium bicarbonate  650 mg Oral BID   sodium chloride flush  3 mL Intravenous Q12H    Continuous Infusions:  dextrose 5 % and 0.9 % NaCl Stopped (07/19/23 0855)   piperacillin-tazobactam (ZOSYN)  IV 12.5 mL/hr at 07/19/23 1100   potassium chloride 10 mEq (07/19/23 1151)   potassium PHOSPHATE IVPB (in mmol)      PRN Meds: acetaminophen, albuterol, HYDROmorphone (DILAUDID) injection, iohexol, ondansetron **OR** ondansetron (ZOFRAN) IV, mouth rinse, polyethylene glycol, sodium chloride flush  Physical Exam         Awake alert Sitting up in bed Able to feed himself No  acute distress Has dysarthria Has left-sided weakness  Vital Signs: BP (!) 131/56 (BP Location: Left Arm)   Pulse 63   Temp 98.2 F (36.8 C) (Oral)   Resp 20   Ht 5\' 5"  (1.651 m)   Wt 82.5 kg   SpO2 100%   BMI 30.27 kg/m  SpO2: SpO2: 100 % O2 Device: O2 Device: Room Air O2 Flow Rate: O2 Flow Rate (L/min): 0  L/min  Intake/output summary:  Intake/Output Summary (Last 24 hours) at 07/19/2023 1305 Last data filed at 07/19/2023 1100 Gross per 24 hour  Intake 1450.93 ml  Output 1650 ml  Net -199.07 ml   LBM: Last BM Date : 07/17/23 (flexiseal) Baseline Weight: Weight: 80.1 kg Most recent weight: Weight: 82.5 kg       Palliative Assessment/Data:      Patient Active Problem List   Diagnosis Date Noted   Ileus (HCC) 07/09/2023   Cerebrovascular accident (CVA) (HCC) 07/09/2023   Altered mental status 07/07/2023   Hypernatremia 07/07/2023   DNR (do not resuscitate) 07/06/2023   Palliative care encounter 06/25/2023   Acute right MCA stroke (HCC) 06/25/2023   Goals of care, counseling/discussion 06/25/2023   Transaminitis 06/18/2023   Dysphagia 06/18/2023   Diabetic ketoacidosis without coma associated with type 2 diabetes mellitus (HCC) 06/17/2023   Acute renal failure superimposed on stage 3b chronic kidney disease (HCC) 06/16/2023   Mixed hyperlipidemia 06/16/2023   Atrial fibrillation with rapid ventricular response (HCC) 06/16/2023   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 07/02/2013   Essential hypertension 07/02/2013   Bipolar disorder (manic depression) (HCC) 07/02/2013   Hypokalemia 07/02/2013   Severe sepsis due to perirectal abscess(HCC) 07/02/2013    Palliative Care Assessment & Plan   Patient Profile:    Assessment: 77 year old gentleman who lives at [facility] memory care assisted living facility, history of bipolar disorder dementia, stage IIIb chronic kidney disease coronary artery disease diastolic heart failure hypertension Admitted with fever tachypnea found to have perirectal abscess Hospital course complicated by severe sepsis deemed secondary to perirectal abscess, new onset of atrial fibrillation with rapid ventricular response, acute metabolic encephalopathy superimposed on dementia, right-sided MCA embolic appearing stroke. Has underlying history of seizure disorder, moderate mitral regurgitation. Has dysphagia, recurrent aspirations, modified barium swallow done and SLP consulted and patient on dysphagia 1 diet. Palliative care following for goals of care discussions. Chart reviewed, patient seen and examined, call placed and was able to reach her legal guardian Caryn Bee at [phone].   Recommendations/Plan: Chart reviewed, CT scan of the abdomen and pelvis results noted GI note, physical therapy also following.  TOC note reviewed.  Plan is for the patient to go to [place] re[facility] towards the end of this hospitalization.  Recommend addition of palliative services at [facility] rehab.  Call placed, unable to reach legal guardian at this time.  No new inpatient palliative specific recommendations at this time, continue scope of current care, palliative department remains available on an as-needed basis for further goals of care discussions.    Code Status:    Code Status Orders  (From admission, onward)           Start      Ordered   06/15/23 1526  Full code  (Code Status)  Continuous       Question:  By:  Answer:  Default: patient does not have capacity for decision  making, no surrogate or prior directive available   06/15/23 1525           Code Status History     Date Active Date Inactive Code Status Order ID Comments User Context   06/15/2023 1500 06/15/2023 1525 Limited: Do not attempt resuscitation (DNR) -DNR-LIMITED -Do Not Intubate/DNI  [id number]  Maryln Gottron, MD ED   07/02/2013 1417 07/10/2013 1845 Partial Code [id number]  Lupita Leash, MD ED       Prognosis:  Unable to determine  Discharge Planning: Skilled Nursing Facility for rehab with Palliative care service follow-up  Care plan was discussed with patient,  legal guardian [name] unable to be reached at [phone].   Thank you for allowing the Palliative Medicine Team to assist in the care of this patient. Mod MDM Rosalin Hawking MD.      Greater than 50%  of this time was spent counseling and coordinating care related to the above assessment and plan.  Rosalin Hawking, MD  Please contact Palliative Medicine Team phone at [phone] for questions and concerns.

## 2023-07-19 NOTE — Plan of Care (Signed)
Problem: Education: Goal: Ability to describe self-care measures that may prevent or decrease complications (Diabetes Survival Skills Education) will improve Outcome: Progressing Goal: Individualized Educational Video(s) Outcome: Progressing   Problem: Coping: Goal: Ability to adjust to condition or change in health will improve Outcome: Progressing   Problem: Fluid Volume: Goal: Ability to maintain a balanced intake and output will improve Outcome: Progressing   Problem: Health Behavior/Discharge Planning: Goal: Ability to identify and utilize available resources and services will improve Outcome: Progressing Goal: Ability to manage health-related needs will improve Outcome: Progressing   Problem: Metabolic: Goal: Ability to maintain appropriate glucose levels will improve Outcome: Progressing   Problem: Nutritional: Goal: Maintenance of adequate nutrition will improve Outcome: Progressing Goal: Progress toward achieving an optimal weight will improve Outcome: Progressing   Problem: Skin Integrity: Goal: Risk for impaired skin integrity will decrease Outcome: Progressing   Problem: Tissue Perfusion: Goal: Adequacy of tissue perfusion will improve Outcome: Progressing   Problem: Education: Goal: Knowledge of General Education information will improve Description: Including pain rating scale, medication(s)/side effects and non-pharmacologic comfort measures Outcome: Progressing   Problem: Health Behavior/Discharge Planning: Goal: Ability to manage health-related needs will improve Outcome: Progressing   Problem: Clinical Measurements: Goal: Ability to maintain clinical measurements within normal limits will improve Outcome: Progressing Goal: Will remain free from infection Outcome: Progressing Goal: Diagnostic test results will improve Outcome: Progressing Goal: Respiratory complications will improve Outcome: Progressing Goal: Cardiovascular complication will  be avoided Outcome: Progressing   Problem: Activity: Goal: Risk for activity intolerance will decrease Outcome: Progressing   Problem: Nutrition: Goal: Adequate nutrition will be maintained Outcome: Progressing   Problem: Coping: Goal: Level of anxiety will decrease Outcome: Progressing   Problem: Elimination: Goal: Will not experience complications related to bowel motility Outcome: Progressing Goal: Will not experience complications related to urinary retention Outcome: Progressing   Problem: Pain Managment: Goal: General experience of comfort will improve Outcome: Progressing   Problem: Safety: Goal: Ability to remain free from injury will improve Outcome: Progressing   Problem: Skin Integrity: Goal: Risk for impaired skin integrity will decrease Outcome: Progressing   Problem: Fluid Volume: Goal: Hemodynamic stability will improve Outcome: Progressing   Problem: Clinical Measurements: Goal: Diagnostic test results will improve Outcome: Progressing Goal: Signs and symptoms of infection will decrease Outcome: Progressing   Problem: Respiratory: Goal: Ability to maintain adequate ventilation will improve Outcome: Progressing   Problem: Education: Goal: Ability to describe self-care measures that may prevent or decrease complications (Diabetes Survival Skills Education) will improve Outcome: Progressing Goal: Individualized Educational Video(s) Outcome: Progressing   Problem: Cardiac: Goal: Ability to maintain an adequate cardiac output will improve Outcome: Progressing   Problem: Health Behavior/Discharge Planning: Goal: Ability to identify and utilize available resources and services will improve Outcome: Progressing Goal: Ability to manage health-related needs will improve Outcome: Progressing   Problem: Fluid Volume: Goal: Ability to achieve a balanced intake and output will improve Outcome: Progressing   Problem: Metabolic: Goal: Ability to  maintain appropriate glucose levels will improve Outcome: Progressing   Problem: Nutritional: Goal: Maintenance of adequate nutrition will improve Outcome: Progressing Goal: Maintenance of adequate weight for body size and type will improve Outcome: Progressing   Problem: Respiratory: Goal: Will regain and/or maintain adequate ventilation Outcome: Progressing   Problem: Urinary Elimination: Goal: Ability to achieve and maintain adequate renal perfusion and functioning will improve Outcome: Progressing   Problem: Education: Goal: Knowledge of disease or condition will improve Outcome: Progressing Goal: Knowledge of secondary prevention will improve (  MUST DOCUMENT ALL) Outcome: Progressing Goal: Knowledge of patient specific risk factors will improve Loraine Leriche N/A or DELETE if not current risk factor) Outcome: Progressing   Problem: Ischemic Stroke/TIA Tissue Perfusion: Goal: Complications of ischemic stroke/TIA will be minimized Outcome: Progressing

## 2023-07-19 NOTE — Progress Notes (Signed)
Physical Therapy Treatment Patient Details Name: Eric Zamora MRN: [id number] DOB: Jun 10, 1946 Today's Date: 07/19/2023   History of Present Illness Pt is 77 yo admitted with severe sepsis due to perirectal abscess.  Pt to OR on 10/18 for drainage of abscess.  Pt with increased lethargy on 10/24 , MRI Head performed and pt found to have acute infarct  right frontal operculum with moderate associated cytotoxic edema and petechial hemorrhage. Pt found to have ileus 07/06/23 (still present 11/17) and with new onset afib during admisiion. PMH: bipolar disorder, dementia, history of NSTEMI, hypertrophic cardiomyopathy    PT Comments  Pt more alert today.  Demonstrated improved initiation with transfers and improved posture.  Required mod A x 2 but was able to participate with multiple transfers and take small steps to the chair.  Continue to recommend Patient will benefit from continued inpatient follow up therapy, <3 hours/day     If plan is discharge home, recommend the following: Two people to help with walking and/or transfers;Two people to help with bathing/dressing/bathroom;Assistance with cooking/housework;Direct supervision/assist for financial management;Assist for transportation;Help with stairs or ramp for entrance   Can travel by private vehicle     No  Equipment Recommendations  None recommended by PT    Recommendations for Other Services       Precautions / Restrictions Precautions Precautions: Fall Precaution Comments: monitor HR Restrictions Weight Bearing Restrictions: No     Mobility  Bed Mobility Overal bed mobility: Needs Assistance Bed Mobility: Supine to Sit     Supine to sit: Mod assist     General bed mobility comments: Pt managing legs and lifting trunk using bed rail but mod A to scoot forward with bed pad.  Does have cotton sheets making sliding difficult.    Transfers Overall transfer level: Needs assistance Equipment used: Rolling walker (2  wheels) Transfers: Sit to/from Stand Sit to Stand: Mod assist, +2 physical assistance   Step pivot transfers: Mod assist, +2 physical assistance       General transfer comment: STS x 4 during session with mod A x 2.  Step pivot to chair with cues and mod A x 2.  Also stood again and took steps backward to chair with cues.  Tends to lean R but improved from prior visit    Ambulation/Gait               General Gait Details: only steps to chair   Stairs             Wheelchair Mobility     Tilt Bed    Modified Rankin (Stroke Patients Only) Modified Rankin (Stroke Patients Only) Pre-Morbid Rankin Score: Moderate disability Modified Rankin: Severe disability     Balance Overall balance assessment: Needs assistance Sitting-balance support: Feet supported, Single extremity supported Sitting balance-Leahy Scale: Poor Sitting balance - Comments: At least single UE support, could maintain static balance with slumped posture.  With challenges leans posteriorly requiring min A.  Not leaning R today.  Worked on reaching forward to targets prior to standing   Standing balance support: Bilateral upper extremity supported Standing balance-Leahy Scale: Poor Standing balance comment: Stood with RW and mod A of 2  but not leaning R today                            Cognition Arousal: Alert Behavior During Therapy: WFL for tasks assessed/performed Overall Cognitive Status: No family/caregiver present to determine baseline cognitive  functioning                                 General Comments: alert, answers simple questions, slurred speach, follows commands with increased time and cuing        Exercises General Exercises - Lower Extremity Ankle Circles/Pumps: AROM, 10 reps, Supine, Both Quad Sets: AROM, Both, 10 reps, Supine Long Arc Quad: AROM, Both, Seated, 20 reps Heel Slides: AAROM, Both, 10 reps, Supine Straight Leg Raises: AROM, Both,  Supine, 10 reps Other Exercises Other Exercises: multimodal cues for quad sets; cues to take time and full ROM with all exercises (tends to do partial and quick movements)    General Comments General comments (skin integrity, edema, etc.): HR 128 bpm max today.      Pertinent Vitals/Pain Pain Assessment Pain Assessment: No/denies pain    Home Living                          Prior Function            PT Goals (current goals can now be found in the care plan section) Progress towards PT goals: Progressing toward goals    Frequency    Min 1X/week      PT Plan      Co-evaluation              AM-PAC PT "6 Clicks" Mobility   Outcome Measure  Help needed turning from your back to your side while in a flat bed without using bedrails?: A Lot Help needed moving from lying on your back to sitting on the side of a flat bed without using bedrails?: A Lot Help needed moving to and from a bed to a chair (including a wheelchair)?: Total Help needed standing up from a chair using your arms (e.g., wheelchair or bedside chair)?: Total Help needed to walk in hospital room?: Total Help needed climbing 3-5 steps with a railing? : Total 6 Click Score: 8    End of Session Equipment Utilized During Treatment: Gait belt Activity Tolerance: Patient tolerated treatment well Patient left: with chair alarm set;in chair;with call bell/phone within reach (pillows under bottom and supporting R side; maxi move sling in place) Nurse Communication: Mobility status;Need for lift equipment PT Visit Diagnosis: Other abnormalities of gait and mobility (R26.89);Muscle weakness (generalized) (M62.81)     Time: 1610-9604 PT Time Calculation (min) (ACUTE ONLY): 27 min  Charges:    $Therapeutic Activity: 8-22 mins $Neuromuscular Re-education: 8-22 mins PT General Charges $$ ACUTE PT VISIT: 1 Visit                     Anise Salvo, PT Acute Rehab Services [facility]  [phone]    Rayetta Humphrey 07/19/2023, 11:05 AM

## 2023-07-19 NOTE — Progress Notes (Signed)
PROGRESS NOTE    Eric Zamora  WNU:[id number] DOB: June 28, 1946 DOA: 06/15/2023 PCP: Eric Bristle, PA-C   Brief Narrative:  Brief hospital course: Eric Zamora is a 77 y.o. 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, CAD, dCHF, HTN, hx DVT no longer on [facility], and HLD who admitted on 10/15 for severe sepsis due to perirectal abscess. 10/15 admitted to stepdown unit. 10/17: Overnight with fever, aspiration event, new AG acidosis, transferred back to SDU for BiPAP and insulin drip 10/18: taken to OR for drainage of abscess 10/23 cardiology was consulted for new onset A-fib with RVR. 10/24 nephrology was consulted for AKI and hyponatremia.  CT head was obtained for ongoing AMS and was found to have acute/subacute right MCA territory infarct.  Neurology was also consulted. 10/25.  Palliative care was consulted. 10/31.  Anticoagulation started again. 11/1.  Second dose of Bicillin given.  Underwent MBS. 11/2.  Marinol started for poor p.o. intake. 11/4.  Found to have ileus.  GI consulted.  Recommended DNR to legal guardian. 11/5.  Two-physician DNR form was signed and sent to guardian.  Will await decision.  ** Patient continues to have severe hypokalemia so we will continue to replete.  Patient was tolerating a diet up until last night and this morning was not very hungry.  Had 2 more abdominal distention so we will repeat a CT scan of the abdomen pelvis.    Repeat CT Scan as below and continues to show ileus and now shows Colitis so will start back on IV Zosyn.  Currently we will hold his laxative for now given that he continues to have some quite a bit of loose stools.  GI feels that he truly does not have an ileus but feels that his colitis could be infectious versus ischemic in nature.  They recommended checking C. difficile and GI pathogen panel which were both negative.  Assessment and Plan:  Severe Sepsis due to Perirectal  Abscess present on admission. -Sepsis Physiology improved  -Patient seen in consultation by general surgery and underwent I&D on 06/18/2023. -Penrose drain placed and later removed.  No cultures were sent. -Patient noted to remain on IV vancomycin, Flagyl, Rocephin subsequently transitioned to cefepime and has been transition to Augmentin and doxycycline and recommending a total of 2 weeks of antibiotic treatment from initial I&D. -Antibiotic course completed. -Supportive care. -WOC nurse consulted and there is concern for a stage II pressure injury but this is the area where the patient had a repair rectal abscess that was drained and about the same region with a Penrose drain and on today's presentation it appeared to be a healing surgical incision  Colitis  ?  Ileus -Repletion of electrolytes to keep potassium approximately 4, magnesium approximately 2. -Patient with some clinical improvement, abdomen less distended, softer, diffusely nontender to palpation with large watery stools. -Stool softeners and laxatives have been stopped -Full liquid diet going back to Dysphagia 1 Diet but had issues last night and was not eating very well this AM so will repeat CT Abd/Pelvis without Contrast  -Repeat CT Scan Abd/Pelvis without Contrast done and showed "Right-sided pleural effusion with volume loss or consolidation at the right base. Cardiomegaly. Left kidney cyst. Thickening of the wall of the cecum and ascending colon consistent with colitis. Thickening of the wall of the rectum, and a mucosal process is not excluded. Endoscopic correlation should be considered. Right inguinal canal soft tissue density structure could  be undescended right testis. Scrotal ultrasound correlation would be helpful. Aortic atherosclerosis." -IV fluids resumed with D5 NS at 30 mL/hr x 1 day -Resume antibiotics with IV Zosyn -Rectal tube in place. -Palliative care consulted and DNR recommended to legal  guardian. -Frequent turns. -Gastroenterology consulted and they recommended obtaining a C. difficile and GI pathogen panel which were both negative -GI feels that patient does not have a true ileus and is unlikely given that he has no nausea or vomiting and ongoing diarrhea and feel that his colitis could be related to infectious process; if you has not a candidate for colonoscopy given his generalized deconditioning and multiple comorbidities and that I feel that rectal wall thickening could be related to recent drainage of the perirectal abscess.  He also feels the right-sided colitis could be ischemic in nature versus infectious  New onset A-fib with RVR/HOCM -Patient during the hospitalization to have new onset A-fib with RVR with CHADS2Vasc score of 4. -Patient seen in consultation by cardiology was on metoprolol 100 mg 4 times daily subsequently changed to Toprol-XL. -Patient placed back on IV Lopressor due to colonic ileus. -Was on Eliquis for anticoagulation now on IV heparin due to ileus. -IV heparin has abeen transitioned to full dose Lovenox. -Likely resume Eliquis once ileus has fully resolved .   Acute Metabolic Encephalopathy. History of Dementia. Acute Right MCA Stroke. atient noted to have a progressive worsening mentation early on in the hospitalization. -CT head done 06/24/2023 with acute/subacute cortical and subcortical right MCA territory infarct. -MRI brain done consistent with acute infarct. -MRA brain done with no acute abnormalities. -Carotid Dopplers done with no significant ICA stenosis. -EEG done negative for seizures. -Repeat head CT done on 07/01/2023 per neurology recommendations showed no hemorrhagic conversion and as such neurology recommended resumption of anticoagulation with Eliquis. -Patient being followed by SLP was on dysphagia 1 diet however had to be placed on full liquids due to colonic ileus but will go back to Dysphagia 1 Diet -PT/OT following and  recommending SNF placement.   History of Seizures. -EEG done negative for seizures/epileptiform activity. -Depakote on hold due to hepatic function. -Was on Keppra which is currently on hold but will resume today -Seizure Precautions   Moderate MR -Continue to Monitor volume status.  Dysphagia due to Stroke. -Secondary to acute CVA. -Being followed by SLP, underwent MBS was on a dysphagia 1 diet and subsequently downgraded to clear liquids due to colonic ileus and now has been advanced to a full liquid diet.   -Once ileus resolved will resume dysphagia 1 diet. -SLP following and patient declined p.o. twice today with SLP attempting in the morning and at lunchtime  Electrolyte Abnormalities including Severe Hypokalemia/Hypocalcemia/Hypophosphatemia/Hypomagnesemia -Electrolyte Trend: Recent Labs  Lab 07/13/23 1410 07/14/23 0313 07/15/23 0327 07/16/23 0349 07/17/23 0215 07/18/23 0530 07/19/23 0514  K 2.5* 2.3* 2.4* 2.7* 2.6* 2.4* 2.1*  CALCIUM 7.8* 7.5* 7.3* 7.4* 7.2* 7.4* 7.3*  PHOS  --  2.6 1.9* 2.1* 2.1* 2.5 2.0*  MG  --  1.8 2.2 2.1 2.1 1.9 1.9  -Replete with po KCL 40 mEQ BID x2, IV KCL 60 mEQ x2; I K Phos 30 mmol -Continue to Monitor and Replete as Necessary    Hypernatremia, improved  -Likely secondary to hypovolemic hypernatremia secondary to dehydration. -Na+ Trend: Recent Labs  Lab 07/13/23 1410 07/14/23 0313 07/15/23 0327 07/16/23 0349 07/17/23 0215 07/18/23 0530 07/19/23 0514  NA 139 137 138 140 136 141 139  -Improved with hydration and change of  IV fluids to D5W. -IVF with NS + 20 mEQ Kcl at 40 mL/hr x1 Day now stopped -Repeat labs in the AM.   History of Syphilis. -RPR reactive for 2 years. -Dr. Allena Katz discussed with ID and patient currently being treated with Bicillin on a weekly basis.  Received a dose 07/09/2023.   AKI on CKD 3B Non Anion Gap Metabolic Acidosis -BUN/Cr Trend: Recent Labs  Lab 07/13/23 1410 07/14/23 0313 07/15/23 0327  07/16/23 0349 07/17/23 0215 07/18/23 0530 07/19/23 0514  BUN 5* <5* <5* <5* 5* <5* <5*  CREATININE 1.19 1.12 1.25* 1.34* 1.25* 1.10 1.08  -Bicarbonate gtt discontinued; Acidosis now shows a CO2 is now 21, anion gap is 8, chloride level was 110 -Will start sodium bicarb tabs 650 mg p.o. twice daily and resume IVF as above -Avoid Nephrotoxic Medications, Contrast Dyes, Hypotension and Dehydration to Ensure Adequate Renal Perfusion and will need to Renally Adjust Meds -Continue to Monitor and Trend Renal Function carefully and repeat CMP in the AM   CAD -Patient not on DAPT or antiplatelets secondary to being on Eliquis. -Was on IV heparin due to ileus but transitioned to Lovenox.  -Outpatient follow-up with cardiology.   Bipolar Disorder -Not on any medications prior to admission. -Stable.  Normocytic Anemia -Hgb/Hct Trend: Recent Labs  Lab 07/13/23 0319 07/14/23 0313 07/15/23 0327 07/16/23 1213 07/17/23 0215 07/18/23 0530 07/19/23 0514  HGB 9.3* 8.7* 8.9* 9.2* 8.9* 9.1* 8.9*  HCT 28.9* 26.4* 27.6* 28.1* 27.8* 28.6* 27.8*  MCV 90.0 89.8 89.6 89.8 90.3 90.8 88.0  -Checked Anemia Panel and showed an iron level of 37, UIBC of 88, TIBC of 125, saturation ratio 30%, ferritin level 147, folate level 12.0, vitamin B12 569 -Continue to Monitor for S/Sx of Bleeding; No overt bleeding noted -Repeat CBC in the AM   Type II DM, uncontrolled with hyperglycemia. -Currently on sliding scale insulin. -CBG Trend: Recent Labs  Lab 07/18/23 2007 07/19/23 0002 07/19/23 0343 07/19/23 0741 07/19/23 0820 07/19/23 1128 07/19/23 1656  GLUCAP 75 73 64* 60* 151* 116* 92   Poor p.o. Intake. -Likely multifactorial secondary to acute CVA and colonic ileus. -Was initially placed on Marinol which was currently on hold due to ileus. -Tolerating full liquid diet and will go back to Dysphagia 1 Diet and was tolerating this well up until last night and this AM and didn't want to eat -Repeat CT  scan of the abdomen and pelvis done and showed "Right-sided pleural effusion with volume loss or consolidation at the right base. Cardiomegaly. Left kidney cyst. Thickening of the wall of the cecum and ascending colon consistent with colitis. Thickening of the wall of the rectum, and a mucosal process is not excluded. Endoscopic correlation should be considered. Right inguinal canal soft tissue density structure could be undescended right testis. Scrotal ultrasound correlation would be helpful. Aortic atherosclerosis."  Thrush Possible pill induced Gastritis. -Patient completed course of antibiotics. -Was on Diflucan through 07/08/2023 and Thrush worsened now. -Status post completion of nystatin swish and swallow but will resume -Nystatin and Fluconazole ordered again and will continue  Goals of Care Conversation. -Dr. Allena Katz discussed with legal guardian on the phone. -Patient noted with poor prognosis due to large right MCA infarct with deficits and dysphagia with dysarthria. -Patient noted with poor oral intake and now with colonic ileus currently under conservative treatment. -It is felt with acute CVA patient likely will not return to his prior status prior to admission. -It is felt that the patient has a cardiac  arrest prognosis remains very poor to return to his prior status prior to arrest. -Patient with poor quality of life and Dr. Allena Katz recommended legal guardian to consider DNR for patient. -Palliative care following and the palliative care team was unable to reach the patient's legal guardian at this time  Hypoalbuminemia -Patient's Albumin Trend: Recent Labs  Lab 07/13/23 0319 07/14/23 0313 07/15/23 0327 07/16/23 0349 07/17/23 0215 07/18/23 0530 07/19/23 0514  ALBUMIN 2.4* 3.3* 2.8* 2.5* 2.6* 2.6* 2.6*  -Continue to Monitor and Trend and repeat CMP in the AM  Obesity -Complicates overall prognosis and care -Estimated body mass index is 30.27 kg/m as calculated from the  following:   Height as of this encounter: 5\' 5"  (1.651 m).   Weight as of this encounter: 82.5 kg.  -Weight Loss and Dietary Counseling given   DVT prophylaxis: SCDs Start: 06/15/23 1459    Code Status: Limited: Do not attempt resuscitation (DNR) -DNR-LIMITED -Do Not Intubate/DNI  Family Communication: No family currently at bedside  Disposition Plan:  Level of care: Telemetry Status is: Inpatient Remains inpatient appropriate because: Needs further clinical improvement and clearance by specialist to go to the [facility] re[facility]   Consultants:  General surgery: Dr. Derrell Lolling 06/18/2023 Gastroenterology: Dr. Ewing Schlein 07/05/2023 Cardiology: Dr. Royann Shivers 06/23/2023 Nephrology: Dr. Ronalee Belts 06/24/2023 Neurology: Dr.Bhagat 06/24/2023 Palliative care: Dr. Patterson Hammersmith 06/25/2023  Procedures:  Repeat CT Abd/Pelvis 07/18/2023 PICC line placement 07/08/2023 CT abdomen and pelvis 07/05/2023 CT head 06/15/2023, 06/24/2023, 07/01/2023 Chest x-ray 06/15/2023, 06/17/2023 Modified barium swallow 06/19/2023 Abdominal films 07/05/2023, 07/06/2023, 07/08/2023, 07/09/2023, 07/10/2023, 07/12/2023 MRI brain 06/25/2023 MRA head 06/25/2023 Right upper quadrant ultrasound 06/22/2023 2D echo 06/16/2023 Carotid Dopplers 06/25/2023 CT chest abdomen and pelvis 06/17/2023 EEG 06/25/2023 Irrigation and debridement perirectal abscess per general surgery: Dr. Derrell Lolling 06/18/2023  Antimicrobials:  Anti-infectives (From admission, onward)    Start     Dose/Rate Route Frequency Ordered Stop   07/20/23 1000  fluconazole (DIFLUCAN) tablet 200 mg        200 mg Oral Daily 07/19/23 1145     07/19/23 1300  fluconazole (DIFLUCAN) tablet 100 mg        100 mg Oral  Once 07/19/23 1145 07/19/23 1303   07/18/23 1830  piperacillin-tazobactam (ZOSYN) IVPB 3.375 g        3.375 g 12.5 mL/hr over 240 Minutes Intravenous Every 8 hours 07/18/23 1808     07/16/23 1300  fluconazole (DIFLUCAN) tablet 100 mg  Status:  Discontinued        100 mg Oral  Daily 07/16/23 1154 07/19/23 1145   07/05/23 1600  fluconazole (DIFLUCAN) IVPB 200 mg        200 mg 100 mL/hr over 60 Minutes Intravenous Every 24 hours 07/05/23 1320 07/08/23 1658   07/02/23 1000  fluconazole (DIFLUCAN) tablet 100 mg  Status:  Discontinued        100 mg Oral Daily 07/02/23 0802 07/05/23 1320   07/02/23 0800  penicillin g benzathine (BICILLIN LA) 1200000 UNIT/2ML injection 2.4 Million Units        2.4 Million Units Intramuscular Weekly 07/01/23 1209 07/09/23 0950   06/26/23 1500  penicillin g benzathine (BICILLIN LA) 1200000 UNIT/2ML injection 2.4 Million Units        2.4 Million Units Intramuscular  Once 06/26/23 1351 06/28/23 0018   06/24/23 2200  amoxicillin-clavulanate (AUGMENTIN) 500-125 MG per tablet 1 tablet  Status:  Discontinued        1 tablet Oral Every 12 hours 06/24/23 1450 07/03/23 1202   06/24/23 2200  amoxicillin-clavulanate (AUGMENTIN) 500-125 MG per tablet 1 tablet  Status:  Discontinued        1 tablet Oral 2 times daily 06/24/23 1451 06/24/23 1451   06/21/23 1215  doxycycline (VIBRA-TABS) tablet 100 mg  Status:  Discontinued        100 mg Oral Every 12 hours 06/21/23 1128 07/03/23 1202   06/21/23 1215  amoxicillin-clavulanate (AUGMENTIN) 875-125 MG per tablet 1 tablet  Status:  Discontinued        1 tablet Oral Every 12 hours 06/21/23 1128 06/24/23 1450   06/20/23 1200  vancomycin (VANCOREADY) IVPB 1500 mg/300 mL  Status:  Discontinued        1,500 mg 150 mL/hr over 120 Minutes Intravenous Every 36 hours 06/20/23 0719 06/21/23 1128   06/19/23 0000  vancomycin (VANCOREADY) IVPB 1250 mg/250 mL  Status:  Discontinued        1,250 mg 166.7 mL/hr over 90 Minutes Intravenous Every 36 hours 06/18/23 0939 06/20/23 0719   06/18/23 1800  ceFEPIme (MAXIPIME) 2 g in sodium chloride 0.9 % 100 mL IVPB  Status:  Discontinued        2 g 200 mL/hr over 30 Minutes Intravenous Every 12 hours 06/18/23 0939 06/21/23 1128   06/18/23 0600  ceFEPIme (MAXIPIME) 2 g in sodium  chloride 0.9 % 100 mL IVPB  Status:  Discontinued        2 g 200 mL/hr over 30 Minutes Intravenous Every 24 hours 06/17/23 0920 06/18/23 0939   06/17/23 1400  vancomycin (VANCOCIN) IVPB 1000 mg/200 mL premix  Status:  Discontinued        1,000 mg 200 mL/hr over 60 Minutes Intravenous Every 36 hours 06/17/23 0920 06/18/23 0939   06/16/23 1800  vancomycin (VANCOREADY) IVPB 750 mg/150 mL  Status:  Discontinued        750 mg 150 mL/hr over 60 Minutes Intravenous Every 24 hours 06/15/23 1639 06/17/23 0920   06/16/23 1100  cefTRIAXone (ROCEPHIN) 2 g in sodium chloride 0.9 % 100 mL IVPB  Status:  Discontinued        2 g 200 mL/hr over 30 Minutes Intravenous Every 24 hours 06/15/23 1453 06/15/23 1455   06/15/23 1800  ceFEPIme (MAXIPIME) 2 g in sodium chloride 0.9 % 100 mL IVPB  Status:  Discontinued        2 g 200 mL/hr over 30 Minutes Intravenous Every 12 hours 06/15/23 1558 06/17/23 0920   06/15/23 1700  metroNIDAZOLE (FLAGYL) IVPB 500 mg  Status:  Discontinued        500 mg 100 mL/hr over 60 Minutes Intravenous Every 12 hours 06/15/23 1455 06/21/23 1128   06/15/23 1630  vancomycin (VANCOREADY) IVPB 1500 mg/300 mL        1,500 mg 150 mL/hr over 120 Minutes Intravenous  Once 06/15/23 1558 06/15/23 2058   06/15/23 1045  cefTRIAXone (ROCEPHIN) 2 g in sodium chloride 0.9 % 100 mL IVPB        2 g 200 mL/hr over 30 Minutes Intravenous  Once 06/15/23 1041 06/15/23 1155       Subjective: Seen and examined at bedside and was lethargic and slumped in the chair falling asleep.  Felt okay but continues to mumble and is dysarthric.  Denied any complaints of pain.  No other concerns reported at this time.  Objective: Vitals:   07/18/23 2005 07/19/23 0500 07/19/23 0603 07/19/23 1600  BP: (!) 153/70  (!) 131/56 (!) 130/56  Pulse: 94  63 98  Resp: 20  20 16  Temp: 98.5 F (36.9 C)  98.2 F (36.8 C) 98.3 F (36.8 C)  TempSrc: Oral  Oral Oral  SpO2: 99%  100% 100%  Weight:  82.5 kg    Height:         Intake/Output Summary (Last 24 hours) at 07/19/2023 1833 Last data filed at 07/19/2023 1800 Gross per 24 hour  Intake 1370.11 ml  Output 2050 ml  Net -679.89 ml   Filed Weights   07/17/23 0427 07/18/23 0500 07/19/23 0500  Weight: 83.7 kg 82.8 kg 82.5 kg   Examination: Physical Exam:  Constitutional: Chronically ill-appearing African-American male who is withdrawn and fatigued appearing Respiratory: Diminished to auscultation bilaterally, no wheezing, rales, rhonchi or crackles. Normal respiratory effort and patient is not tachypenic. No accessory muscle use.  Unlabored breathing Cardiovascular: RRR, no murmurs / rubs / gallops. S1 and S2 auscultated. No extremity edema. Abdomen: Soft, non-tender, distended secondary body habitus bowel sounds are hyperactive.  Bowel sounds positive.  GU: Deferred.  Flexi-Seal in Musculoskeletal: No clubbing / cyanosis of digits/nails. No joint deformity upper and lower extremities. Neurologic: Has weakness on his left and a facial droop and dysarthria Psychiatric: Appears a little withdrawn and lethargic  Data Reviewed: I have personally reviewed following labs and imaging studies  CBC: Recent Labs  Lab 07/15/23 0327 07/16/23 1213 07/17/23 0215 07/18/23 0530 07/19/23 0514  WBC 4.8 4.4 5.4 4.9 5.0  NEUTROABS 2.8 2.4 3.1 2.9 3.1  HGB 8.9* 9.2* 8.9* 9.1* 8.9*  HCT 27.6* 28.1* 27.8* 28.6* 27.8*  MCV 89.6 89.8 90.3 90.8 88.0  PLT 158 190 197 208 227   Basic Metabolic Panel: Recent Labs  Lab 07/15/23 0327 07/16/23 0349 07/17/23 0215 07/18/23 0530 07/19/23 0514  NA 138 140 136 141 139  K 2.4* 2.7* 2.6* 2.4* 2.1*  CL 111 115* 112* 114* 110  CO2 20* 19* 19* 20* 21*  GLUCOSE 97 101* 132* 78 60*  BUN <5* <5* 5* <5* <5*  CREATININE 1.25* 1.34* 1.25* 1.10 1.08  CALCIUM 7.3* 7.4* 7.2* 7.4* 7.3*  MG 2.2 2.1 2.1 1.9 1.9  PHOS 1.9* 2.1* 2.1* 2.5 2.0*   GFR: Estimated Creatinine Clearance: 55.7 mL/min (by C-G formula based on SCr of  1.08 mg/dL). Liver Function Tests: Recent Labs  Lab 07/15/23 0327 07/16/23 0349 07/17/23 0215 07/18/23 0530 07/19/23 0514  AST 32 27 33 34 33  ALT 25 23 26 27 27   ALKPHOS 27* 25* 27* 30* 25*  BILITOT 0.6 0.6 0.5 0.5 0.7  PROT 5.7* 5.8* 5.9* 6.0* 6.0*  ALBUMIN 2.8* 2.5* 2.6* 2.6* 2.6*   No results for input(s): "LIPASE", "AMYLASE" in the last 168 hours. No results for input(s): "AMMONIA" in the last 168 hours. Coagulation Profile: No results for input(s): "INR", "PROTIME" in the last 168 hours. Cardiac Enzymes: No results for input(s): "CKTOTAL", "CKMB", "CKMBINDEX", "TROPONINI" in the last 168 hours. BNP (last 3 results) No results for input(s): "PROBNP" in the last 8760 hours. HbA1C: No results for input(s): "HGBA1C" in the last 72 hours. CBG: Recent Labs  Lab 07/19/23 0343 07/19/23 0741 07/19/23 0820 07/19/23 1128 07/19/23 1656  GLUCAP 64* 60* 151* 116* 92   Lipid Profile: No results for input(s): "CHOL", "HDL", "LDLCALC", "TRIG", "CHOLHDL", "LDLDIRECT" in the last 72 hours. Thyroid Function Tests: No results for input(s): "TSH", "T4TOTAL", "FREET4", "T3FREE", "THYROIDAB" in the last 72 hours. Anemia Panel: No results for input(s): "VITAMINB12", "FOLATE", "FERRITIN", "TIBC", "IRON", "RETICCTPCT" in the last 72 hours. Sepsis Labs: No results for  input(s): "PROCALCITON", "LATICACIDVEN" in the last 168 hours.  Recent Results (from the past 240 hour(s))  Gastrointestinal Panel by PCR , Stool     Status: None   Collection Time: 07/19/23 11:40 AM   Specimen: STOOL  Result Value Ref Range Status   Campylobacter species NOT DETECTED NOT DETECTED Final   Plesimonas shigelloides NOT DETECTED NOT DETECTED Final   Salmonella species NOT DETECTED NOT DETECTED Final   Yersinia enterocolitica NOT DETECTED NOT DETECTED Final   Vibrio species NOT DETECTED NOT DETECTED Final   Vibrio cholerae NOT DETECTED NOT DETECTED Final   Enteroaggregative E coli (EAEC) NOT DETECTED NOT  DETECTED Final   Enteropathogenic E coli (EPEC) NOT DETECTED NOT DETECTED Final   Enterotoxigenic E coli (ETEC) NOT DETECTED NOT DETECTED Final   Shiga like toxin producing E coli (STEC) NOT DETECTED NOT DETECTED Final   Shigella/Enteroinvasive E coli (EIEC) NOT DETECTED NOT DETECTED Final   Cryptosporidium NOT DETECTED NOT DETECTED Final   Cyclospora cayetanensis NOT DETECTED NOT DETECTED Final   Entamoeba histolytica NOT DETECTED NOT DETECTED Final   Giardia lamblia NOT DETECTED NOT DETECTED Final   Adenovirus F40/41 NOT DETECTED NOT DETECTED Final   Astrovirus NOT DETECTED NOT DETECTED Final   Norovirus GI/GII NOT DETECTED NOT DETECTED Final   Rotavirus A NOT DETECTED NOT DETECTED Final   Sapovirus (I, II, IV, and V) NOT DETECTED NOT DETECTED Final    Comment: Performed at [facility], [address] [place], [place] [address]  C Difficile Quick Screen (NO PCR Reflex)     Status: None   Collection Time: 07/19/23 11:40 AM   Specimen: STOOL  Result Value Ref Range Status   C Diff antigen NEGATIVE NEGATIVE Final   C Diff toxin NEGATIVE NEGATIVE Final   C Diff interpretation No C. difficile detected.  Final    Comment: Performed at [facility], [address], [place], [place] [address]     Radiology Studies: CT ABDOMEN PELVIS WO CONTRAST  Result Date: 07/18/2023 CLINICAL DATA:  Abdominal pain. EXAM: CT ABDOMEN AND PELVIS WITHOUT CONTRAST TECHNIQUE: Multidetector CT imaging of the abdomen and pelvis was performed following the standard protocol without IV contrast. RADIATION DOSE REDUCTION: This exam was performed according to the departmental dose-optimization program which includes automated exposure control, adjustment of the mA and/or kV according to patient size and/or use of iterative reconstruction technique. COMPARISON:  07/05/2023. FINDINGS: Lower chest: Small right-sided pleural effusion. Volume loss or consolidation at the right base.  Cardiomegaly. Atheromatous calcifications. Trace pericardial effusion or pericardial thickening. Hepatobiliary: No focal liver abnormality is seen. No gallstones, gallbladder wall thickening, or biliary dilatation. Pancreas: Unremarkable. No pancreatic ductal dilatation or surrounding inflammatory changes. Spleen: Normal in size without focal abnormality. Adrenals/Urinary Tract: No adrenal lesions. Left kidney cyst measures 2.8 cm. No follow up imaging recommended. No hydronephrosis or nephrolithiasis. Unremarkable urinary bladder. Stomach/Bowel: Stomach is unremarkable. No small bowel dilatation. There is marked thickening of the wall of the cecum and ascending colon consistent with colitis. There is dilatation of the transverse colon consistent with ileus. There is thickening of the wall of the rectum, endo mucosal process is not excluded. Vascular/Lymphatic: Aortic atherosclerosis. No enlarged abdominal or pelvic lymph nodes. Reproductive: Prostate is unremarkable. Right inguinal canal soft tissue density structure could be undescended right testis Other: No abdominal wall hernia or abnormality. No abdominopelvic ascites. Musculoskeletal: No acute or significant osseous findings. IMPRESSION: 1. Right-sided pleural effusion with volume loss or consolidation at the right  base. 2. Cardiomegaly. 3. Left kidney cyst. 4. Thickening of the wall of the cecum and ascending colon consistent with colitis. 5. Thickening of the wall of the rectum, and a mucosal process is not excluded. Endoscopic correlation should be considered. 6. Right inguinal canal soft tissue density structure could be undescended right testis. Scrotal ultrasound correlation would be helpful. 7. Aortic atherosclerosis. Aortic Atherosclerosis (ICD10-I70.0). Electronically Signed   By: Layla Maw M.D.   On: 07/18/2023 16:44    Scheduled Meds:  Chlorhexidine Gluconate Cloth  6 each Topical Daily   enoxaparin (LOVENOX) injection  80 mg  Subcutaneous Q12H   [START ON 07/20/2023] fluconazole  200 mg Oral Daily   insulin glargine-yfgn  8 Units Subcutaneous QHS   levETIRAcetam  500 mg Oral BID   magic mouthwash  10 mL Oral QID   metoprolol tartrate  25 mg Oral BID   nystatin  5 mL Oral QID   mouth rinse  15 mL Mouth Rinse 4 times per day   pantoprazole  40 mg Oral Daily   PARoxetine  10 mg Oral Daily   potassium chloride  40 mEq Oral BID   simethicone  40 mg Oral QID   simvastatin  20 mg Oral QHS   sodium bicarbonate  650 mg Oral BID   sodium chloride flush  3 mL Intravenous Q12H   Continuous Infusions:  dextrose 5 % and 0.9 % NaCl Stopped (07/19/23 1732)   piperacillin-tazobactam (ZOSYN)  IV 12.5 mL/hr at 07/19/23 1800   potassium chloride     potassium PHOSPHATE IVPB (in mmol) 85 mL/hr at 07/19/23 1800    LOS: 34 days   Marguerita Merles, DO [facility] Available via Epic secure chat 7am-7pm After these hours, please refer to coverage provider listed on [email or web address] 07/19/2023, 6:33 PM

## 2023-07-20 ENCOUNTER — Inpatient Hospital Stay (HOSPITAL_COMMUNITY): Payer: Medicare (Managed Care)

## 2023-07-20 DIAGNOSIS — R1310 Dysphagia, unspecified: Secondary | ICD-10-CM | POA: Diagnosis not present

## 2023-07-20 DIAGNOSIS — I2510 Atherosclerotic heart disease of native coronary artery without angina pectoris: Secondary | ICD-10-CM | POA: Diagnosis not present

## 2023-07-20 DIAGNOSIS — A419 Sepsis, unspecified organism: Secondary | ICD-10-CM | POA: Diagnosis not present

## 2023-07-20 DIAGNOSIS — E1110 Type 2 diabetes mellitus with ketoacidosis without coma: Secondary | ICD-10-CM | POA: Diagnosis not present

## 2023-07-20 LAB — COMPREHENSIVE METABOLIC PANEL
ALT: 24 U/L (ref 0–44)
AST: 29 U/L (ref 15–41)
Albumin: 2.5 g/dL — ABNORMAL LOW (ref 3.5–5.0)
Alkaline Phosphatase: 25 U/L — ABNORMAL LOW (ref 38–126)
Anion gap: 7 (ref 5–15)
BUN: <5 mg/dL — ABNORMAL LOW (ref 8–23)
CO2: 19 mmol/L — ABNORMAL LOW (ref 22–32)
Calcium: 7.1 mg/dL — ABNORMAL LOW (ref 8.9–10.3)
Chloride: 111 mmol/L (ref 98–111)
Creatinine, Ser: 1.09 mg/dL (ref 0.61–1.24)
GFR, Estimated: >60 mL/min (ref >60)
Glucose, Bld: 92 mg/dL (ref 70–99)
Potassium: 3.1 mmol/L — ABNORMAL LOW (ref 3.5–5.1)
Sodium: 137 mmol/L (ref 135–145)
Total Bilirubin: 0.5 mg/dL (ref <1.2)
Total Protein: 5.9 g/dL — ABNORMAL LOW (ref 6.5–8.1)

## 2023-07-20 LAB — BASIC METABOLIC PANEL
Anion gap: 6 (ref 5–15)
Anion gap: 9 (ref 5–15)
BUN: <5 mg/dL — ABNORMAL LOW (ref 8–23)
BUN: <5 mg/dL — ABNORMAL LOW (ref 8–23)
CO2: 20 mmol/L — ABNORMAL LOW (ref 22–32)
CO2: 20 mmol/L — ABNORMAL LOW (ref 22–32)
Calcium: 7.2 mg/dL — ABNORMAL LOW (ref 8.9–10.3)
Calcium: 7.5 mg/dL — ABNORMAL LOW (ref 8.9–10.3)
Chloride: 112 mmol/L — ABNORMAL HIGH (ref 98–111)
Chloride: 109 mmol/L (ref 98–111)
Creatinine, Ser: 1.09 mg/dL (ref 0.61–1.24)
Creatinine, Ser: 1.08 mg/dL (ref 0.61–1.24)
GFR, Estimated: >60 mL/min (ref >60)
GFR, Estimated: >60 mL/min (ref >60)
Glucose, Bld: 88 mg/dL (ref 70–99)
Glucose, Bld: 114 mg/dL — ABNORMAL HIGH (ref 70–99)
Potassium: 5.3 mmol/L — ABNORMAL HIGH (ref 3.5–5.1)
Potassium: 2.7 mmol/L — CL (ref 3.5–5.1)
Sodium: 138 mmol/L (ref 135–145)
Sodium: 138 mmol/L (ref 135–145)

## 2023-07-20 LAB — CBC WITH DIFFERENTIAL/PLATELET
Abs Immature Granulocytes: 0.01 10*3/uL (ref 0.00–0.07)
Basophils Absolute: 0.1 10*3/uL (ref 0.0–0.1)
Basophils Relative: 1 %
Eosinophils Absolute: 0.4 10*3/uL (ref 0.0–0.5)
Eosinophils Relative: 7 %
HCT: 26.6 % — ABNORMAL LOW (ref 39.0–52.0)
Hemoglobin: 8.7 g/dL — ABNORMAL LOW (ref 13.0–17.0)
Immature Granulocytes: 0 %
Lymphocytes Relative: 26 %
Lymphs Abs: 1.4 10*3/uL (ref 0.7–4.0)
MCH: 29 pg (ref 26.0–34.0)
MCHC: 32.7 g/dL (ref 30.0–36.0)
MCV: 88.7 fL (ref 80.0–100.0)
Monocytes Absolute: 0.6 10*3/uL (ref 0.1–1.0)
Monocytes Relative: 11 %
Neutro Abs: 3 10*3/uL (ref 1.7–7.7)
Neutrophils Relative %: 55 %
Platelets: 218 10*3/uL (ref 150–400)
RBC: 3 MIL/uL — ABNORMAL LOW (ref 4.22–5.81)
RDW: 15 % (ref 11.5–15.5)
WBC: 5.5 10*3/uL (ref 4.0–10.5)
nRBC: 0 % (ref 0.0–0.2)

## 2023-07-20 LAB — MAGNESIUM
Magnesium: 2 mg/dL (ref 1.7–2.4)
Magnesium: 2 mg/dL (ref 1.7–2.4)

## 2023-07-20 LAB — GLUCOSE, CAPILLARY
Glucose-Capillary: 90 mg/dL (ref 70–99)
Glucose-Capillary: 80 mg/dL (ref 70–99)
Glucose-Capillary: 73 mg/dL (ref 70–99)
Glucose-Capillary: 86 mg/dL (ref 70–99)
Glucose-Capillary: 88 mg/dL (ref 70–99)
Glucose-Capillary: 106 mg/dL — ABNORMAL HIGH (ref 70–99)

## 2023-07-20 LAB — PHOSPHORUS
Phosphorus: 1.9 mg/dL — ABNORMAL LOW (ref 2.5–4.6)
Phosphorus: 2.1 mg/dL — ABNORMAL LOW (ref 2.5–4.6)
Phosphorus: 2.7 mg/dL (ref 2.5–4.6)

## 2023-07-20 MED ORDER — POTASSIUM CHLORIDE 10 MEQ/100ML IV SOLN
10.0000 meq | INTRAVENOUS | Status: DC
  Administered 2023-07-20 (×2): 10 meq via INTRAVENOUS
  Filled 2023-07-20 (×2): qty 100

## 2023-07-20 MED ORDER — POTASSIUM PHOSPHATES 15 MMOLE/5ML IV SOLN: 30.0000 mmol | Freq: Once | INTRAVENOUS | Status: DC

## 2023-07-20 MED ORDER — CHOLESTYRAMINE 4 G PO PACK: 4.0000 g | PACK | Freq: Two times a day (BID) | ORAL | Status: DC

## 2023-07-20 MED ORDER — SODIUM PHOSPHATES 45 MMOLE/15ML IV SOLN
15.0000 mmol | Freq: Once | INTRAVENOUS | Status: AC
  Administered 2023-07-20: 15 mmol via INTRAVENOUS
  Filled 2023-07-20: qty 5

## 2023-07-20 MED ORDER — POTASSIUM CHLORIDE 10 MEQ/100ML IV SOLN
10.0000 meq | INTRAVENOUS | Status: AC
Start: 2023-07-21 — End: 2023-07-21
  Administered 2023-07-20 – 2023-07-21 (×6): 10 meq via INTRAVENOUS
  Filled 2023-07-20 (×6): qty 100

## 2023-07-20 MED ORDER — DEXTROSE-SODIUM CHLORIDE 5-0.9 % IV SOLN: INTRAVENOUS | Status: AC

## 2023-07-20 MED ORDER — CHOLESTYRAMINE LIGHT 4 G PO PACK
4.0000 g | PACK | Freq: Every day | ORAL | Status: DC
  Administered 2023-07-20 – 2023-07-21 (×2): 4 g via ORAL
  Filled 2023-07-20 (×2): qty 1

## 2023-07-20 MED ORDER — POTASSIUM CHLORIDE 20 MEQ PO PACK
40.0000 meq | PACK | Freq: Two times a day (BID) | ORAL | Status: DC
  Administered 2023-07-20: 40 meq via ORAL
  Filled 2023-07-20: qty 2

## 2023-07-20 NOTE — Progress Notes (Signed)
Occupational Therapy Treatment Patient Details Name: Eric Zamora MRN: [id number] DOB: 03/31/1946 Today's Date: 07/20/2023   History of present illness Pt is 77 yo admitted with severe sepsis due to perirectal abscess.  Pt to OR on 10/18 for drainage of abscess.  Pt with increased lethargy on 10/24 , MRI Head performed and pt found to have acute infarct  right frontal operculum with moderate associated cytotoxic edema and petechial hemorrhage. Pt found to have ileus 07/06/23 (still present 11/17) and with new onset afib during admisiion. PMH: bipolar disorder, dementia, history of NSTEMI, hypertrophic cardiomyopathy   OT comments  Pt currently requires min A +2 for bed mobility, improved sitting EOB balance with intermittent posterior / R lean. Fair + static sitting balance occasionally requiring min A to correct with consistent verbal cues for upright posture. Pt participating in seated grooming tasks with setup, and dynamic seated balance activities. Pt performs alternating hand placement and reaching across midline for weight shift challenge needed for increased independence in ADL performance. Discharge recommendation remains appropriate. OT will continue to follow for functional gains.       If plan is discharge home, recommend the following:  Two people to help with walking and/or transfers;Two people to help with bathing/dressing/bathroom;Direct supervision/assist for medications management;Help with stairs or ramp for entrance;Supervision due to cognitive status;Assist for transportation   Equipment Recommendations  Other (comment)    Recommendations for Other Services      Precautions / Restrictions Precautions Precautions: Fall Restrictions Weight Bearing Restrictions: No       Mobility Bed Mobility Overal bed mobility: Needs Assistance Bed Mobility: Supine to Sit, Sit to Supine     Supine to sit: Min assist, +2 for physical assistance Sit to supine: Mod assist, +2 for  physical assistance, +2 for safety/equipment   General bed mobility comments: improved bed mobility this date, pt initiates task and able to perform with less assist compared to previous sessions    Transfers                   General transfer comment: NT, pt returning spontanously to supine     Balance Overall balance assessment: Needs assistance Sitting-balance support: Feet supported, Single extremity supported Sitting balance-Leahy Scale: Poor Sitting balance - Comments: worked on dynamic balance activity with lateral weight shifts, sitting balance occassionally required physical assist posteriorly and cues to maintain upright posture. Briefly able to sustain upright posture but reverts quickly back to R/posterior lea Postural control: Posterior lean, Right lateral lean                                 ADL either performed or assessed with clinical judgement   ADL Overall ADL's : Needs assistance/impaired     Grooming: Sitting;Set up;Wash/dry face;Wash/dry hands Grooming Details (indicate cue type and reason): setup for grooming task, CGA - min A for seated balance                               General ADL Comments: Session focused on improving activity tolerance and dynamic seated balance for ADL performance. Able to sit EOB for ~5 mins working on BUE coordination and dynamic balance activities, follows directions with increased time + visual / verbal cuing. Pt spontanously returning to supine when tired. Increased R lateral lean, and consistent cues to keep bilat feet placed on floor as pt  tends to have a significant posterior lean when unsupported.      Cognition Arousal: Alert Behavior During Therapy: WFL for tasks assessed/performed Overall Cognitive Status: No family/caregiver present to determine baseline cognitive functioning                                 General Comments: alert, answers simple questions, slurred  speach, follows commands with increased time and cuing                   Pertinent Vitals/ Pain       Pain Assessment Pain Assessment: No/denies pain   Frequency  Min 1X/week        Progress Toward Goals  OT Goals(current goals can now be found in the care plan section)  Progress towards OT goals: Progressing toward goals  Acute Rehab OT Goals OT Goal Formulation: Patient unable to participate in goal setting Time For Goal Achievement: 07/26/23 Potential to Achieve Goals: Fair ADL Goals Pt Will Perform Grooming: with supervision;with set-up;sitting Pt Will Perform Upper Body Bathing: with contact guard assist;with supervision;sitting Pt Will Perform Upper Body Dressing: with contact guard assist;with supervision;sitting Pt Will Transfer to Toilet: with mod assist;stand pivot transfer  Plan         AM-PAC OT "6 Clicks" Daily Activity     Outcome Measure   Help from another person eating meals?: A Little Help from another person taking care of personal grooming?: A Lot Help from another person toileting, which includes using toliet, bedpan, or urinal?: Total Help from another person bathing (including washing, rinsing, drying)?: A Lot Help from another person to put on and taking off regular upper body clothing?: A Lot Help from another person to put on and taking off regular lower body clothing?: A Lot 6 Click Score: 12    End of Session    OT Visit Diagnosis: Other abnormalities of gait and mobility (R26.89);Muscle weakness (generalized) (M62.81);Cognitive communication deficit (R41.841)   Activity Tolerance Patient tolerated treatment well   Patient Left in bed;with call bell/phone within reach;with bed alarm set   Nurse Communication Mobility status        Time: 1453-1511 OT Time Calculation (min): 18 min  Charges: OT General Charges $OT Visit: 1 Visit OT Treatments $Self Care/Home Management : 8-22 mins  Lavren Lewan L. Willow Reczek, OTR/L   07/20/23, 4:47 PM

## 2023-07-20 NOTE — Progress Notes (Signed)
Subjective: Lying on bed. Distended abdomen noted. Rectal tube draining liquid green stool.  Objective: Vital signs in last 24 hours: Temp:  [98 F (36.7 C)-98.4 F (36.9 C)] 98.4 F (36.9 C) (11/19 8657) Pulse Rate:  [98-135] 108 (11/19 0632) Resp:  [16-19] 19 (11/19 8469) BP: (124-130)/(56-91) 124/82 (11/19 6295) SpO2:  [99 %-100 %] 100 % (11/19 2841) Weight:  [80.8 kg] 80.8 kg (11/19 0500) Weight change: -1.714 kg Last BM Date : 07/17/23 (flexiseal)  PE: Deconditioned, ill-appearing GENERAL: Mild pallor  ABDOMEN: Distended abdomen with hyperactive bowel sounds EXTREMITIES: No deformity  Lab Results: Results for orders placed or performed during the hospital encounter of 06/15/23 (from the past 48 hour(s))  Glucose, capillary     Status: None   Collection Time: 07/18/23 12:04 PM  Result Value Ref Range   Glucose-Capillary 75 70 - 99 mg/dL    Comment: Glucose reference range applies only to samples taken after fasting for at least 8 hours.  Glucose, capillary     Status: None   Collection Time: 07/18/23  3:27 PM  Result Value Ref Range   Glucose-Capillary 79 70 - 99 mg/dL    Comment: Glucose reference range applies only to samples taken after fasting for at least 8 hours.  Glucose, capillary     Status: None   Collection Time: 07/18/23  8:07 PM  Result Value Ref Range   Glucose-Capillary 75 70 - 99 mg/dL    Comment: Glucose reference range applies only to samples taken after fasting for at least 8 hours.  Glucose, capillary     Status: None   Collection Time: 07/19/23 12:02 AM  Result Value Ref Range   Glucose-Capillary 73 70 - 99 mg/dL    Comment: Glucose reference range applies only to samples taken after fasting for at least 8 hours.  Glucose, capillary     Status: Abnormal   Collection Time: 07/19/23  3:43 AM  Result Value Ref Range   Glucose-Capillary 64 (L) 70 - 99 mg/dL    Comment: Glucose reference range applies only to samples taken after fasting for at  least 8 hours.  CBC with Differential/Platelet     Status: Abnormal   Collection Time: 07/19/23  5:14 AM  Result Value Ref Range   WBC 5.0 4.0 - 10.5 K/uL   RBC 3.16 (L) 4.22 - 5.81 MIL/uL   Hemoglobin 8.9 (L) 13.0 - 17.0 g/dL   HCT 32.4 (L) 40.1 - 02.7 %   MCV 88.0 80.0 - 100.0 fL   MCH 28.2 26.0 - 34.0 pg   MCHC 32.0 30.0 - 36.0 g/dL   RDW 25.3 66.4 - 40.3 %   Platelets 227 150 - 400 K/uL   nRBC 0.0 0.0 - 0.2 %   Neutrophils Relative % 62 %   Neutro Abs 3.1 1.7 - 7.7 K/uL   Lymphocytes Relative 22 %   Lymphs Abs 1.1 0.7 - 4.0 K/uL   Monocytes Relative 10 %   Monocytes Absolute 0.5 0.1 - 1.0 K/uL   Eosinophils Relative 5 %   Eosinophils Absolute 0.2 0.0 - 0.5 K/uL   Basophils Relative 1 %   Basophils Absolute 0.0 0.0 - 0.1 K/uL   Immature Granulocytes 0 %   Abs Immature Granulocytes 0.01 0.00 - 0.07 K/uL    Comment: Performed at [facility], [address], [place], [place] [address]  Comprehensive metabolic panel     Status: Abnormal   Collection Time: 07/19/23  5:14 AM  Result Value  Ref Range   Sodium 139 135 - 145 mmol/L   Potassium 2.1 (LL) 3.5 - 5.1 mmol/L    Comment: CRITICAL RESULT CALLED TO, READ BACK BY AND VERIFIED WITH RYAN,T. RN AT 0701 07/19/23 MULLINS,T    Chloride 110 98 - 111 mmol/L   CO2 21 (L) 22 - 32 mmol/L   Glucose, Bld 60 (L) 70 - 99 mg/dL    Comment: Glucose reference range applies only to samples taken after fasting for at least 8 hours.   BUN <5 (L) 8 - 23 mg/dL   Creatinine, Ser 0.98 0.61 - 1.24 mg/dL   Calcium 7.3 (L) 8.9 - 10.3 mg/dL   Total Protein 6.0 (L) 6.5 - 8.1 g/dL   Albumin 2.6 (L) 3.5 - 5.0 g/dL   AST 33 15 - 41 U/L   ALT 27 0 - 44 U/L   Alkaline Phosphatase 25 (L) 38 - 126 U/L   Total Bilirubin 0.7 <1.2 mg/dL   GFR, Estimated >11 >91 mL/min    Comment: (NOTE) Calculated using the CKD-EPI Creatinine Equation (2021)    Anion gap 8 5 - 15    Comment: Performed at [facility], [address] [place], [place] [address]  Magnesium     Status: None   Collection Time: 07/19/23  5:14 AM  Result Value Ref Range   Magnesium 1.9 1.7 - 2.4 mg/dL    Comment: Performed at [facility], [address], [place], [place] [address]  Phosphorus     Status: Abnormal   Collection Time: 07/19/23  5:14 AM  Result Value Ref Range   Phosphorus 2.0 (L) 2.5 - 4.6 mg/dL    Comment: Performed at [facility], [address], [place], [place] [address]  Glucose, capillary     Status: Abnormal   Collection Time: 07/19/23  7:41 AM  Result Value Ref Range   Glucose-Capillary 60 (L) 70 - 99 mg/dL    Comment: Glucose reference range applies only to samples taken after fasting for at least 8 hours.  Glucose, capillary     Status: Abnormal   Collection Time: 07/19/23  8:20 AM  Result Value Ref Range   Glucose-Capillary 151 (H) 70 - 99 mg/dL    Comment: Glucose reference range applies only to samples taken after fasting for at least 8 hours.  Glucose, capillary     Status: Abnormal   Collection Time: 07/19/23 11:28 AM  Result Value Ref Range   Glucose-Capillary 116 (H) 70 - 99 mg/dL    Comment: Glucose reference range applies only to samples taken after fasting for at least 8 hours.  Gastrointestinal Panel by PCR , Stool     Status: None   Collection Time: 07/19/23 11:40 AM   Specimen: STOOL  Result Value Ref Range   Campylobacter species NOT DETECTED NOT DETECTED   Plesimonas shigelloides NOT DETECTED NOT DETECTED   Salmonella species NOT DETECTED NOT DETECTED   Yersinia enterocolitica NOT DETECTED NOT DETECTED   Vibrio species NOT DETECTED NOT DETECTED   Vibrio cholerae NOT DETECTED NOT DETECTED   Enteroaggregative E coli (EAEC) NOT DETECTED NOT DETECTED   Enteropathogenic E coli (EPEC) NOT DETECTED NOT DETECTED   Enterotoxigenic E coli (ETEC) NOT DETECTED NOT DETECTED   Shiga like toxin producing E coli (STEC) NOT DETECTED NOT DETECTED    Shigella/Enteroinvasive E coli (EIEC) NOT DETECTED NOT DETECTED   Cryptosporidium NOT DETECTED NOT DETECTED   Cyclospora cayetanensis NOT DETECTED NOT DETECTED  Entamoeba histolytica NOT DETECTED NOT DETECTED   Giardia lamblia NOT DETECTED NOT DETECTED   Adenovirus F40/41 NOT DETECTED NOT DETECTED   Astrovirus NOT DETECTED NOT DETECTED   Norovirus GI/GII NOT DETECTED NOT DETECTED   Rotavirus A NOT DETECTED NOT DETECTED   Sapovirus (I, II, IV, and V) NOT DETECTED NOT DETECTED    Comment: Performed at [facility], [address], [place], [place] [address]  C Difficile Quick Screen (NO PCR Reflex)     Status: None   Collection Time: 07/19/23 11:40 AM   Specimen: STOOL  Result Value Ref Range   C Diff antigen NEGATIVE NEGATIVE   C Diff toxin NEGATIVE NEGATIVE   C Diff interpretation No C. difficile detected.     Comment: Performed at [facility], [address] [place], [place] [address]  Glucose, capillary     Status: None   Collection Time: 07/19/23  4:56 PM  Result Value Ref Range   Glucose-Capillary 92 70 - 99 mg/dL    Comment: Glucose reference range applies only to samples taken after fasting for at least 8 hours.  Glucose, capillary     Status: Abnormal   Collection Time: 07/19/23  8:22 PM  Result Value Ref Range   Glucose-Capillary 119 (H) 70 - 99 mg/dL    Comment: Glucose reference range applies only to samples taken after fasting for at least 8 hours.  Glucose, capillary     Status: None   Collection Time: 07/20/23 12:12 AM  Result Value Ref Range   Glucose-Capillary 90 70 - 99 mg/dL    Comment: Glucose reference range applies only to samples taken after fasting for at least 8 hours.  CBC with Differential/Platelet     Status: Abnormal   Collection Time: 07/20/23  2:10 AM  Result Value Ref Range   WBC 5.5 4.0 - 10.5 K/uL   RBC 3.00 (L) 4.22 - 5.81 MIL/uL   Hemoglobin 8.7 (L) 13.0 - 17.0 g/dL   HCT 86.5 (L) 78.4 - 69.6 %   MCV 88.7 80.0  - 100.0 fL   MCH 29.0 26.0 - 34.0 pg   MCHC 32.7 30.0 - 36.0 g/dL   RDW 29.5 28.4 - 13.2 %   Platelets 218 150 - 400 K/uL   nRBC 0.0 0.0 - 0.2 %   Neutrophils Relative % 55 %   Neutro Abs 3.0 1.7 - 7.7 K/uL   Lymphocytes Relative 26 %   Lymphs Abs 1.4 0.7 - 4.0 K/uL   Monocytes Relative 11 %   Monocytes Absolute 0.6 0.1 - 1.0 K/uL   Eosinophils Relative 7 %   Eosinophils Absolute 0.4 0.0 - 0.5 K/uL   Basophils Relative 1 %   Basophils Absolute 0.1 0.0 - 0.1 K/uL   Immature Granulocytes 0 %   Abs Immature Granulocytes 0.01 0.00 - 0.07 K/uL    Comment: Performed at [facility], [address], [place], [place] [address]  Comprehensive metabolic panel     Status: Abnormal   Collection Time: 07/20/23  2:10 AM  Result Value Ref Range   Sodium 137 135 - 145 mmol/L   Potassium 3.1 (L) 3.5 - 5.1 mmol/L   Chloride 111 98 - 111 mmol/L   CO2 19 (L) 22 - 32 mmol/L   Glucose, Bld 92 70 - 99 mg/dL    Comment: Glucose reference range applies only to samples taken after fasting for at least 8 hours.   BUN <5 (L) 8 - 23  mg/dL   Creatinine, Ser 0.86 0.61 - 1.24 mg/dL   Calcium 7.1 (L) 8.9 - 10.3 mg/dL   Total Protein 5.9 (L) 6.5 - 8.1 g/dL   Albumin 2.5 (L) 3.5 - 5.0 g/dL   AST 29 15 - 41 U/L   ALT 24 0 - 44 U/L   Alkaline Phosphatase 25 (L) 38 - 126 U/L   Total Bilirubin 0.5 <1.2 mg/dL   GFR, Estimated >57 >84 mL/min    Comment: (NOTE) Calculated using the CKD-EPI Creatinine Equation (2021)    Anion gap 7 5 - 15    Comment: Performed at [facility], [address], [place], [place] [address]  Magnesium     Status: None   Collection Time: 07/20/23  2:10 AM  Result Value Ref Range   Magnesium 2.0 1.7 - 2.4 mg/dL    Comment: Performed at [facility], [address], [place], [place] [address]  Phosphorus     Status: Abnormal   Collection Time: 07/20/23  2:10 AM  Result Value Ref Range   Phosphorus 1.9 (L) 2.5 - 4.6 mg/dL     Comment: Performed at [facility], [address], [place], [place] [address]  Glucose, capillary     Status: None   Collection Time: 07/20/23  3:32 AM  Result Value Ref Range   Glucose-Capillary 80 70 - 99 mg/dL    Comment: Glucose reference range applies only to samples taken after fasting for at least 8 hours.  Glucose, capillary     Status: None   Collection Time: 07/20/23  7:57 AM  Result Value Ref Range   Glucose-Capillary 73 70 - 99 mg/dL    Comment: Glucose reference range applies only to samples taken after fasting for at least 8 hours.    Studies/Results: DG Abd 1 View  Result Date: 07/20/2023 CLINICAL DATA:  Abdominal distension. EXAM: ABDOMEN - 1 VIEW COMPARISON:  July 16, 2023.  July 18, 2023. FINDINGS: Stable colonic dilatation is noted. No definite small bowel dilatation is noted. IMPRESSION: Stable colonic dilatation is noted suggesting ileus or obstruction. Electronically Signed   By: Lupita Raider M.D.   On: 07/20/2023 07:47   CT ABDOMEN PELVIS WO CONTRAST  Result Date: 07/18/2023 CLINICAL DATA:  Abdominal pain. EXAM: CT ABDOMEN AND PELVIS WITHOUT CONTRAST TECHNIQUE: Multidetector CT imaging of the abdomen and pelvis was performed following the standard protocol without IV contrast. RADIATION DOSE REDUCTION: This exam was performed according to the departmental dose-optimization program which includes automated exposure control, adjustment of the mA and/or kV according to patient size and/or use of iterative reconstruction technique. COMPARISON:  07/05/2023. FINDINGS: Lower chest: Small right-sided pleural effusion. Volume loss or consolidation at the right base. Cardiomegaly. Atheromatous calcifications. Trace pericardial effusion or pericardial thickening. Hepatobiliary: No focal liver abnormality is seen. No gallstones, gallbladder wall thickening, or biliary dilatation. Pancreas: Unremarkable. No pancreatic ductal dilatation or surrounding  inflammatory changes. Spleen: Normal in size without focal abnormality. Adrenals/Urinary Tract: No adrenal lesions. Left kidney cyst measures 2.8 cm. No follow up imaging recommended. No hydronephrosis or nephrolithiasis. Unremarkable urinary bladder. Stomach/Bowel: Stomach is unremarkable. No small bowel dilatation. There is marked thickening of the wall of the cecum and ascending colon consistent with colitis. There is dilatation of the transverse colon consistent with ileus. There is thickening of the wall of the rectum, endo mucosal process is not excluded. Vascular/Lymphatic: Aortic atherosclerosis. No enlarged abdominal or pelvic lymph nodes. Reproductive: Prostate is unremarkable. Right inguinal  canal soft tissue density structure could be undescended right testis Other: No abdominal wall hernia or abnormality. No abdominopelvic ascites. Musculoskeletal: No acute or significant osseous findings. IMPRESSION: 1. Right-sided pleural effusion with volume loss or consolidation at the right base. 2. Cardiomegaly. 3. Left kidney cyst. 4. Thickening of the wall of the cecum and ascending colon consistent with colitis. 5. Thickening of the wall of the rectum, and a mucosal process is not excluded. Endoscopic correlation should be considered. 6. Right inguinal canal soft tissue density structure could be undescended right testis. Scrotal ultrasound correlation would be helpful. 7. Aortic atherosclerosis. Aortic Atherosclerosis (ICD10-I70.0). Electronically Signed   By: Layla Maw M.D.   On: 07/18/2023 16:44    Medications: I have reviewed the patient's current medications.  Assessment: CT with wall thickening of cecum and ascending colon?  Ischemic GI pathogen panel and C. difficile negative  Thickening of wall of rectum?  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 I will start him on cholestyramine 4 g once a day for diarrhea, which will likely help with potassium loss as patient has significant hypokalemia, which could be contributing to ileus like picture on x-ray(stable colonic dilation).  Kerin Salen, MD 07/20/2023, 9:46 AM

## 2023-07-20 NOTE — Progress Notes (Signed)
PROGRESS NOTE    Eric Zamora  WUJ:[id number] DOB: 02-06-1946 DOA: 06/15/2023 PCP: Uvaldo Bristle, PA-C   Brief Narrative:  Brief hospital course: Eric Zamora is a 77 y.o. 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, CAD, dCHF, HTN, hx DVT no longer on [facility], and HLD who admitted on 10/15 for severe sepsis due to perirectal abscess. 10/15 admitted to stepdown unit. 10/17: Overnight with fever, aspiration event, new AG acidosis, transferred back to SDU for BiPAP and insulin drip 10/18: taken to OR for drainage of abscess 10/23 cardiology was consulted for new onset A-fib with RVR. 10/24 nephrology was consulted for AKI and hyponatremia.  CT head was obtained for ongoing AMS and was found to have acute/subacute right MCA territory infarct.  Neurology was also consulted. 10/25.  Palliative care was consulted. 10/31.  Anticoagulation started again. 11/1.  Second dose of Bicillin given.  Underwent MBS. 11/2.  Marinol started for poor p.o. intake. 11/4.  Found to have ileus.  GI consulted.  Recommended DNR to legal guardian. 11/5.  Two-physician DNR form was signed and sent to guardian.  Will await decision.  **Since then the patient continues to have severe hypokalemia so we will continue to replete.  Was doing better but then started worsening again.  Had 2 more abdominal distention so we will repeat a CT scan of the abdomen pelvis.    Repeat CT Scan as below and continues to show ileus and now shows Colitis so will start back on IV Zosyn. Currently holding laxatives for now given that he continues to have some quite a bit of loose stools.  GI feels that he truly does not have an ileus but feels that his colitis could be infectious versus ischemic in nature. They recommended checking C. difficile and GI pathogen panel which were both negative.  Electrolytes are being aggressively replete and GI stating the patient on Cholestyramine 4 g  daiily for diarrhea.   Assessment and Plan:  Severe Sepsis due to Perirectal Abscess present on admission. -Sepsis Physiology improved but patient extremely deconditioned -Patient seen in consultation by general surgery and underwent I&D on 06/18/2023. -Penrose drain placed and later removed.  No cultures were sent. -Was on Abx with IV Vancomycin, Flagyl, Rocephin subsequently transitioned to cefepime and has been transition to Augmentin and doxycycline and recommending a total of 2 weeks of antibiotic treatment from initial I&D. Antibiotic course completed but now back on IV Zosyn for Colitis as below -Supportive care. -WOC nurse consulted and there is concern for a stage II pressure injury but this is the area where the patient had a repair rectal abscess that was drained and about the same region with a Penrose drain and on today's presentation it appeared to be a healing surgical incision  Colitis  ?  Ileus -Repletion of electrolytes to keep potassium approximately 4, magnesium approximately 2. -Patient with some clinical improvement, abdomen less distended, softer, diffusely nontender to palpation with large watery stools. -Stool softeners and laxatives have been stopped -Full liquid diet going back to Dysphagia 1 Diet but had issues last night and was not eating very well this AM so will repeat CT Abd/Pelvis without Contrast  -Repeat CT Scan Abd/Pelvis without Contrast done and showed "Right-sided pleural effusion with volume loss or consolidation at the right base. Cardiomegaly. Left kidney cyst. Thickening of the wall of the cecum and ascending colon consistent with colitis. Thickening of the wall of the rectum,  and a mucosal process is not excluded. Endoscopic correlation should be considered. Right inguinal canal soft tissue density structure could be undescended right testis. Scrotal ultrasound correlation would be helpful. Aortic atherosclerosis." -IV fluids resumed with D5 NS at 30  mL/hr x 1 day -Resume antibiotics with IV Zosyn -Rectal tube in place and now having Green Diarrhea -Palliative care consulted and DNR recommended to legal guardian. -Frequent turns. -Gastroenterology consulted and they recommended obtaining a C. difficile and GI pathogen panel which were both negative -GI feels that patient does not have a true ileus and is unlikely given that he has no nausea or vomiting and ongoing diarrhea and feel that his colitis could be related to infectious process; if you has not a candidate for colonoscopy given his generalized deconditioning and multiple comorbidities and that I feel that rectal wall thickening could be related to recent drainage of the perirectal abscess.  He also feels the right-sided colitis could be ischemic in nature versus infectious -Now GI started patient on po Cholestyramine 4 grams daily  New onset A-fib with RVR/HOCM -Patient during the hospitalization to have new onset A-fib with RVR with CHADS2Vasc score of 4. -Patient seen in consultation by cardiology was on metoprolol 100 mg 4 times daily subsequently changed to Toprol-XL. -Patient placed back on IV Lopressor due to colonic ileus. -Was on Eliquis for anticoagulation now on IV heparin due to ileus. -IV heparin has abeen transitioned to full dose Lovenox 80 mg sq q12h. -Likely resume Eliquis once Colitis and ? ileus has fully resolved .   Acute Metabolic Encephalopathy. History of Dementia. Acute Right MCA Stroke. -Patient noted to have a progressive worsening mentation early on in the hospitalization. -CT head done 06/24/2023 with acute/subacute cortical and subcortical right MCA territory infarct. -MRI brain done consistent with acute infarct.; Has facial Droop on Right and weakness -MRA brain done with no acute abnormalities. -Carotid Dopplers done with no significant ICA stenosis. -EEG done negative for seizures. -Repeat head CT done on 07/01/2023 per neurology recommendations  showed no hemorrhagic conversion and as such neurology recommended resumption of anticoagulation with Eliquis. -Patient being followed by SLP was on dysphagia 1 diet however had to be placed on full liquids due to colonic ileus but will go back to Dysphagia 1 Diet -PT/OT following and recommending SNF placement.   History of Seizures. -EEG done negative for seizures/epileptiform activity. -Depakote on hold due to hepatic function. -Resumed Levetiracetam 500 mg po BID -Seizure Precautions   Moderate MR -Continue to Monitor volume status.  Dysphagia due to Stroke. -Secondary to acute CVA. -Being followed by SLP, underwent MBS was on a dysphagia 1 diet and subsequently downgraded to clear liquids due to colonic ileus and now has been advanced to a full liquid diet.   -Once ileus resolved will resume dysphagia 1 diet. -SLP following and patient declined p.o. twice today with SLP attempting in the morning and at lunchtime  Electrolyte Abnormalities including Severe Hypokalemia/Hypocalcemia/Hypophosphatemia/Hypomagnesemia -Electrolyte Trend: Recent Labs  Lab 07/15/23 0327 07/16/23 0349 07/17/23 0215 07/18/23 0530 07/19/23 0514 07/20/23 0210 07/20/23 1105  K 2.4* 2.7* 2.6* 2.4* 2.1* 3.1* 5.3*  CALCIUM 7.3* 7.4* 7.2* 7.4* 7.3* 7.1* 7.2*  PHOS 1.9* 2.1* 2.1* 2.5 2.0* 1.9* 2.1*  MG 2.2 2.1 2.1 1.9 1.9 2.0  --   -Continue to Monitor and Replete as Necessary  -Repeat CMP, Mag, Phos within 1 week    Hypernatremia, improved  -Likely secondary to hypovolemic hypernatremia secondary to dehydration. -Na+ Trend: Recent Labs  Lab 07/15/23 0327 07/16/23 0349 07/17/23 0215 07/18/23 0530 07/19/23 0514 07/20/23 0210 07/20/23 1105  NA 138 140 136 141 139 137 138  -Resume D5 NS at 30 mL/hr x 11 day -Repeat labs in the AM.   History of Syphilis. -RPR reactive for 2 years. -Dr. Allena Katz discussed with ID and patient currently being treated with Bicillin on a weekly basis.  Received a dose  07/09/2023.   AKI on CKD 3B Non Anion Gap Metabolic Acidosis -BUN/Cr Trend: Recent Labs  Lab 07/15/23 0327 07/16/23 0349 07/17/23 0215 07/18/23 0530 07/19/23 0514 07/20/23 0210 07/20/23 1105  BUN <5* <5* 5* <5* <5* <5* <5*  CREATININE 1.25* 1.34* 1.25* 1.10 1.08 1.09 1.09  -Bicarbonate gtt discontinued; Acidosis now shows a CO2 is now 20, anion gap is 6, chloride level was 112 -Will start sodium bicarb tabs 650 mg p.o. twice daily and resume IVF as above -Avoid Nephrotoxic Medications, Contrast Dyes, Hypotension and Dehydration to Ensure Adequate Renal Perfusion and will need to Renally Adjust Meds -Continue to Monitor and Trend Renal Function carefully and repeat CMP in the AM   CAD -Patient not on DAPT or antiplatelets secondary to being on Eliquis. -Was on IV heparin due to ileus but transitioned to Lovenox.  -Outpatient follow-up with cardiology.   Bipolar Disorder -Not on any medications prior to admission. -Stable.  Normocytic Anemia -Hgb/Hct Trend: Recent Labs  Lab 07/14/23 0313 07/15/23 0327 07/16/23 1213 07/17/23 0215 07/18/23 0530 07/19/23 0514 07/20/23 0210  HGB 8.7* 8.9* 9.2* 8.9* 9.1* 8.9* 8.7*  HCT 26.4* 27.6* 28.1* 27.8* 28.6* 27.8* 26.6*  MCV 89.8 89.6 89.8 90.3 90.8 88.0 88.7  -Checked Anemia Panel and showed an iron level of 37, UIBC of 88, TIBC of 125, saturation ratio 30%, ferritin level 147, folate level 12.0, vitamin B12 569 -Continue to Monitor for S/Sx of Bleeding; No overt bleeding noted -Repeat CBC in the AM   Type II DM, uncontrolled with hyperglycemia. -Currently on sliding scale insulin. -CBG ranging from 73-119  Poor p.o. Intake. -Likely multifactorial secondary to acute CVA and colonic ileus. -Was initially placed on Marinol which was currently on hold due to ileus. -Tolerating full liquid diet and will go back to Dysphagia 1 Diet and was tolerating this well up until last night and this AM and didn't want to eat -Repeat CT scan of  the abdomen and pelvis done and showed "Right-sided pleural effusion with volume loss or consolidation at the right base. Cardiomegaly. Left kidney cyst. Thickening of the wall of the cecum and ascending colon consistent with colitis. Thickening of the wall of the rectum, and a mucosal process is not excluded. Endoscopic correlation should be considered. Right inguinal canal soft tissue density structure could be undescended right testis. Scrotal ultrasound correlation would be helpful. Aortic atherosclerosis."  Thrush Possible Pill induced Gastritis. -Patient completed course of antibiotics. -Was on Diflucan through 07/08/2023 and Thrush worsened now. -Status post completion of nystatin swish and swallow but will resume -Nystatin and Fluconazole ordered again and will continue  Goals of Care Conversation. -Dr. Allena Katz discussed with legal guardian on the phone. -Patient noted with poor prognosis due to large right MCA infarct with deficits and dysphagia with dysarthria. -Patient noted with poor oral intake and now with colonic ileus currently under conservative treatment. -It is felt with acute CVA patient likely will not return to his prior status prior to admission. -It is felt that the patient has a cardiac arrest prognosis remains very poor to return to his prior  status prior to arrest. -Patient with poor quality of life and Dr. Allena Katz recommended legal guardian to consider DNR for patient. -Palliative care following and the palliative care team was unable to reach the patient's legal guardian at this time  Hypoalbuminemia -Patient's Albumin Trend: Recent Labs  Lab 07/14/23 0313 07/15/23 0327 07/16/23 0349 07/17/23 0215 07/18/23 0530 07/19/23 0514 07/20/23 0210  ALBUMIN 3.3* 2.8* 2.5* 2.6* 2.6* 2.6* 2.5*  -Continue to Monitor and Trend and repeat CMP in the AM  Obesity -Complicates overall prognosis and care -Estimated body mass index is 29.64 kg/m as calculated from the  following:   Height as of this encounter: 5\' 5"  (1.651 m).   Weight as of this encounter: 80.8 kg.  -Weight Loss and Dietary Counseling given   DVT prophylaxis: SCDs Start: 06/15/23 1459    Code Status: Limited: Do not attempt resuscitation (DNR) -DNR-LIMITED -Do Not Intubate/DNI  Family Communication: No family currently at bedside  Disposition Plan:  Level of care: Telemetry Status is: Inpatient Remains inpatient appropriate because: His further clinical improvement and clearance by specialist; has a bed at SNF once medically stable    Consultants:  General surgery: Dr. Derrell Lolling 06/18/2023 Gastroenterology: Dr. Ewing Schlein 07/05/2023; reconsulted GI and Dr. Marca Ancona saw on 07/19/2023 Cardiology: Dr. Royann Shivers 06/23/2023 Nephrology: Dr. Ronalee Belts 06/24/2023 Neurology: Dr.Bhagat 06/24/2023 Palliative care: Dr. Patterson Hammersmith 06/25/2023    Procedures:  Scrotal U/S 07/20/2023 Repeat CT Abd/Pelvis 07/18/2023 PICC line placement 07/08/2023 CT abdomen and pelvis 07/05/2023 CT head 06/15/2023, 06/24/2023, 07/01/2023 Chest x-ray 06/15/2023, 06/17/2023 Modified barium swallow 06/19/2023 Abdominal films 07/05/2023, 07/06/2023, 07/08/2023, 07/09/2023, 07/10/2023, 07/12/2023 MRI brain 06/25/2023 MRA head 06/25/2023 Right upper quadrant ultrasound 06/22/2023 2D echo 06/16/2023 Carotid Dopplers 06/25/2023 CT chest abdomen and pelvis 06/17/2023 EEG 06/25/2023 Irrigation and debridement perirectal abscess per general surgery: Dr. Derrell Lolling 06/18/2023  Antimicrobials:  Anti-infectives (From admission, onward)    Start     Dose/Rate Route Frequency Ordered Stop   07/20/23 1000  fluconazole (DIFLUCAN) tablet 200 mg        200 mg Oral Daily 07/19/23 1145     07/19/23 1300  fluconazole (DIFLUCAN) tablet 100 mg        100 mg Oral  Once 07/19/23 1145 07/19/23 1303   07/18/23 1830  piperacillin-tazobactam (ZOSYN) IVPB 3.375 g        3.375 g 12.5 mL/hr over 240 Minutes Intravenous Every 8 hours 07/18/23 1808      07/16/23 1300  fluconazole (DIFLUCAN) tablet 100 mg  Status:  Discontinued        100 mg Oral Daily 07/16/23 1154 07/19/23 1145   07/05/23 1600  fluconazole (DIFLUCAN) IVPB 200 mg        200 mg 100 mL/hr over 60 Minutes Intravenous Every 24 hours 07/05/23 1320 07/08/23 1658   07/02/23 1000  fluconazole (DIFLUCAN) tablet 100 mg  Status:  Discontinued        100 mg Oral Daily 07/02/23 0802 07/05/23 1320   07/02/23 0800  penicillin g benzathine (BICILLIN LA) 1200000 UNIT/2ML injection 2.4 Million Units        2.4 Million Units Intramuscular Weekly 07/01/23 1209 07/09/23 0950   06/26/23 1500  penicillin g benzathine (BICILLIN LA) 1200000 UNIT/2ML injection 2.4 Million Units        2.4 Million Units Intramuscular  Once 06/26/23 1351 06/28/23 0018   06/24/23 2200  amoxicillin-clavulanate (AUGMENTIN) 500-125 MG per tablet 1 tablet  Status:  Discontinued        1 tablet Oral Every 12 hours 06/24/23 1450  07/03/23 1202   06/24/23 2200  amoxicillin-clavulanate (AUGMENTIN) 500-125 MG per tablet 1 tablet  Status:  Discontinued        1 tablet Oral 2 times daily 06/24/23 1451 06/24/23 1451   06/21/23 1215  doxycycline (VIBRA-TABS) tablet 100 mg  Status:  Discontinued        100 mg Oral Every 12 hours 06/21/23 1128 07/03/23 1202   06/21/23 1215  amoxicillin-clavulanate (AUGMENTIN) 875-125 MG per tablet 1 tablet  Status:  Discontinued        1 tablet Oral Every 12 hours 06/21/23 1128 06/24/23 1450   06/20/23 1200  vancomycin (VANCOREADY) IVPB 1500 mg/300 mL  Status:  Discontinued        1,500 mg 150 mL/hr over 120 Minutes Intravenous Every 36 hours 06/20/23 0719 06/21/23 1128   06/19/23 0000  vancomycin (VANCOREADY) IVPB 1250 mg/250 mL  Status:  Discontinued        1,250 mg 166.7 mL/hr over 90 Minutes Intravenous Every 36 hours 06/18/23 0939 06/20/23 0719   06/18/23 1800  ceFEPIme (MAXIPIME) 2 g in sodium chloride 0.9 % 100 mL IVPB  Status:  Discontinued        2 g 200 mL/hr over 30 Minutes Intravenous  Every 12 hours 06/18/23 0939 06/21/23 1128   06/18/23 0600  ceFEPIme (MAXIPIME) 2 g in sodium chloride 0.9 % 100 mL IVPB  Status:  Discontinued        2 g 200 mL/hr over 30 Minutes Intravenous Every 24 hours 06/17/23 0920 06/18/23 0939   06/17/23 1400  vancomycin (VANCOCIN) IVPB 1000 mg/200 mL premix  Status:  Discontinued        1,000 mg 200 mL/hr over 60 Minutes Intravenous Every 36 hours 06/17/23 0920 06/18/23 0939   06/16/23 1800  vancomycin (VANCOREADY) IVPB 750 mg/150 mL  Status:  Discontinued        750 mg 150 mL/hr over 60 Minutes Intravenous Every 24 hours 06/15/23 1639 06/17/23 0920   06/16/23 1100  cefTRIAXone (ROCEPHIN) 2 g in sodium chloride 0.9 % 100 mL IVPB  Status:  Discontinued        2 g 200 mL/hr over 30 Minutes Intravenous Every 24 hours 06/15/23 1453 06/15/23 1455   06/15/23 1800  ceFEPIme (MAXIPIME) 2 g in sodium chloride 0.9 % 100 mL IVPB  Status:  Discontinued        2 g 200 mL/hr over 30 Minutes Intravenous Every 12 hours 06/15/23 1558 06/17/23 0920   06/15/23 1700  metroNIDAZOLE (FLAGYL) IVPB 500 mg  Status:  Discontinued        500 mg 100 mL/hr over 60 Minutes Intravenous Every 12 hours 06/15/23 1455 06/21/23 1128   06/15/23 1630  vancomycin (VANCOREADY) IVPB 1500 mg/300 mL        1,500 mg 150 mL/hr over 120 Minutes Intravenous  Once 06/15/23 1558 06/15/23 2058   06/15/23 1045  cefTRIAXone (ROCEPHIN) 2 g in sodium chloride 0.9 % 100 mL IVPB        2 g 200 mL/hr over 30 Minutes Intravenous  Once 06/15/23 1041 06/15/23 1155       Subjective: Seen and examined and was withdrawn wanting to rest.  Denied any complaints.  Was laying on his right side.  Nurse was concerned about worsening facial droop but it looked about the same.  No nausea or vomiting.  Not having abdominal discomfort.  No other concerns or complaints at this time..  Objective: Vitals:   07/20/23 3500 07/20/23 0500 07/20/23 9381  07/20/23 1523  BP: 124/76  124/82 (!) 127/108  Pulse: (!) 135   (!) 108 89  Resp: 19  19 18   Temp: 98 F (36.7 C)  98.4 F (36.9 C) 98.8 F (37.1 C)  TempSrc: Axillary     SpO2: 100%  100% 100%  Weight:  80.8 kg    Height:        Intake/Output Summary (Last 24 hours) at 07/20/2023 1841 Last data filed at 07/20/2023 1500 Gross per 24 hour  Intake 416.25 ml  Output 1000 ml  Net -583.75 ml   Filed Weights   07/18/23 0500 07/19/23 0500 07/20/23 0500  Weight: 82.8 kg 82.5 kg 80.8 kg   Examination: Physical Exam:  Constitutional: Chronically ill-appearing African-American male who is withdrawn and fatigued and looks a little uncomfortable Respiratory: Diminished to auscultation bilaterally, no wheezing, rales, rhonchi or crackles. Normal respiratory effort and patient is not tachypenic. No accessory muscle use.  Unlabored breathing Cardiovascular: RRR, no murmurs / rubs / gallops. S1 and S2 auscultated. No extremity edema.   Abdomen: Soft, non-tender, distended secondary to body habitus.  Bowel sounds positive.  GU: Deferred. Musculoskeletal: No clubbing / cyanosis of digits/nails. No joint deformity upper and lower extremities. Neurologic: Has a crease strength in the left upper extremity compared to the right but has a right-sided facial droop Psychiatric: Withdrawn and lethargic but easily awoken and interactive  Data Reviewed: I have personally reviewed following labs and imaging studies  CBC: Recent Labs  Lab 07/16/23 1213 07/17/23 0215 07/18/23 0530 07/19/23 0514 07/20/23 0210  WBC 4.4 5.4 4.9 5.0 5.5  NEUTROABS 2.4 3.1 2.9 3.1 3.0  HGB 9.2* 8.9* 9.1* 8.9* 8.7*  HCT 28.1* 27.8* 28.6* 27.8* 26.6*  MCV 89.8 90.3 90.8 88.0 88.7  PLT 190 197 208 227 218   Basic Metabolic Panel: Recent Labs  Lab 07/16/23 0349 07/17/23 0215 07/18/23 0530 07/19/23 0514 07/20/23 0210 07/20/23 1105  NA 140 136 141 139 137 138  K 2.7* 2.6* 2.4* 2.1* 3.1* 5.3*  CL 115* 112* 114* 110 111 112*  CO2 19* 19* 20* 21* 19* 20*  GLUCOSE 101* 132* 78  60* 92 88  BUN <5* 5* <5* <5* <5* <5*  CREATININE 1.34* 1.25* 1.10 1.08 1.09 1.09  CALCIUM 7.4* 7.2* 7.4* 7.3* 7.1* 7.2*  MG 2.1 2.1 1.9 1.9 2.0  --   PHOS 2.1* 2.1* 2.5 2.0* 1.9* 2.1*   GFR: Estimated Creatinine Clearance: 54.7 mL/min (by C-G formula based on SCr of 1.09 mg/dL). Liver Function Tests: Recent Labs  Lab 07/16/23 0349 07/17/23 0215 07/18/23 0530 07/19/23 0514 07/20/23 0210  AST 27 33 34 33 29  ALT 23 26 27 27 24   ALKPHOS 25* 27* 30* 25* 25*  BILITOT 0.6 0.5 0.5 0.7 0.5  PROT 5.8* 5.9* 6.0* 6.0* 5.9*  ALBUMIN 2.5* 2.6* 2.6* 2.6* 2.5*   No results for input(s): "LIPASE", "AMYLASE" in the last 168 hours. No results for input(s): "AMMONIA" in the last 168 hours. Coagulation Profile: No results for input(s): "INR", "PROTIME" in the last 168 hours. Cardiac Enzymes: No results for input(s): "CKTOTAL", "CKMB", "CKMBINDEX", "TROPONINI" in the last 168 hours. BNP (last 3 results) No results for input(s): "PROBNP" in the last 8760 hours. HbA1C: No results for input(s): "HGBA1C" in the last 72 hours. CBG: Recent Labs  Lab 07/20/23 0012 07/20/23 0332 07/20/23 0757 07/20/23 1158 07/20/23 1658  GLUCAP 90 80 73 86 88   Lipid Profile: No results for input(s): "CHOL", "HDL", "LDLCALC", "TRIG", "CHOLHDL", "  LDLDIRECT" in the last 72 hours. Thyroid Function Tests: No results for input(s): "TSH", "T4TOTAL", "FREET4", "T3FREE", "THYROIDAB" in the last 72 hours. Anemia Panel: No results for input(s): "VITAMINB12", "FOLATE", "FERRITIN", "TIBC", "IRON", "RETICCTPCT" in the last 72 hours. Sepsis Labs: No results for input(s): "PROCALCITON", "LATICACIDVEN" in the last 168 hours.  Recent Results (from the past 240 hour(s))  Gastrointestinal Panel by PCR , Stool     Status: None   Collection Time: 07/19/23 11:40 AM   Specimen: STOOL  Result Value Ref Range Status   Campylobacter species NOT DETECTED NOT DETECTED Final   Plesimonas shigelloides NOT DETECTED NOT DETECTED  Final   Salmonella species NOT DETECTED NOT DETECTED Final   Yersinia enterocolitica NOT DETECTED NOT DETECTED Final   Vibrio species NOT DETECTED NOT DETECTED Final   Vibrio cholerae NOT DETECTED NOT DETECTED Final   Enteroaggregative E coli (EAEC) NOT DETECTED NOT DETECTED Final   Enteropathogenic E coli (EPEC) NOT DETECTED NOT DETECTED Final   Enterotoxigenic E coli (ETEC) NOT DETECTED NOT DETECTED Final   Shiga like toxin producing E coli (STEC) NOT DETECTED NOT DETECTED Final   Shigella/Enteroinvasive E coli (EIEC) NOT DETECTED NOT DETECTED Final   Cryptosporidium NOT DETECTED NOT DETECTED Final   Cyclospora cayetanensis NOT DETECTED NOT DETECTED Final   Entamoeba histolytica NOT DETECTED NOT DETECTED Final   Giardia lamblia NOT DETECTED NOT DETECTED Final   Adenovirus F40/41 NOT DETECTED NOT DETECTED Final   Astrovirus NOT DETECTED NOT DETECTED Final   Norovirus GI/GII NOT DETECTED NOT DETECTED Final   Rotavirus A NOT DETECTED NOT DETECTED Final   Sapovirus (I, II, IV, and V) NOT DETECTED NOT DETECTED Final    Comment: Performed at [facility], [address] [place], [place] [address]  C Difficile Quick Screen (NO PCR Reflex)     Status: None   Collection Time: 07/19/23 11:40 AM   Specimen: STOOL  Result Value Ref Range Status   C Diff antigen NEGATIVE NEGATIVE Final   C Diff toxin NEGATIVE NEGATIVE Final   C Diff interpretation No C. difficile detected.  Final    Comment: Performed at [facility], [address], [place], [place] [address]    Radiology Studies: DG Abd 1 View  Result Date: 07/20/2023 CLINICAL DATA:  Abdominal distension. EXAM: ABDOMEN - 1 VIEW COMPARISON:  July 16, 2023.  July 18, 2023. FINDINGS: Stable colonic dilatation is noted. No definite small bowel dilatation is noted. IMPRESSION: Stable colonic dilatation is noted suggesting ileus or obstruction. Electronically Signed   By: Lupita Raider M.D.   On: 07/20/2023  07:47    Scheduled Meds:  Chlorhexidine Gluconate Cloth  6 each Topical Daily   cholestyramine light  4 g Oral Q0600   enoxaparin (LOVENOX) injection  80 mg Subcutaneous Q12H   fluconazole  200 mg Oral Daily   insulin glargine-yfgn  8 Units Subcutaneous QHS   levETIRAcetam  500 mg Oral BID   magic mouthwash  10 mL Oral QID   metoprolol tartrate  25 mg Oral BID   nystatin  5 mL Oral QID   mouth rinse  15 mL Mouth Rinse 4 times per day   pantoprazole  40 mg Oral Daily   PARoxetine  10 mg Oral Daily   simethicone  40 mg Oral QID   simvastatin  20 mg Oral QHS   sodium bicarbonate  650 mg Oral BID   sodium chloride flush  3 mL Intravenous Q12H   Continuous  Infusions:  dextrose 5 % and 0.9 % NaCl     piperacillin-tazobactam (ZOSYN)  IV 3.375 g (07/20/23 1745)   sodium phosphate 15 mmol in dextrose 5 % 250 mL infusion 15 mmol (07/20/23 1419)    LOS: 35 days   Marguerita Merles, DO [facility] Available via Epic secure chat 7am-7pm After these hours, please refer to coverage provider listed on [email or web address] 07/20/2023, 6:41 PM

## 2023-07-20 NOTE — Plan of Care (Signed)
Problem: Education: Goal: Ability to describe self-care measures that may prevent or decrease complications (Diabetes Survival Skills Education) will improve Outcome: Progressing Goal: Individualized Educational Video(s) Outcome: Progressing   Problem: Coping: Goal: Ability to adjust to condition or change in health will improve Outcome: Progressing   Problem: Fluid Volume: Goal: Ability to maintain a balanced intake and output will improve Outcome: Progressing   Problem: Health Behavior/Discharge Planning: Goal: Ability to identify and utilize available resources and services will improve Outcome: Progressing Goal: Ability to manage health-related needs will improve Outcome: Progressing   Problem: Metabolic: Goal: Ability to maintain appropriate glucose levels will improve Outcome: Progressing   Problem: Nutritional: Goal: Maintenance of adequate nutrition will improve Outcome: Progressing Goal: Progress toward achieving an optimal weight will improve Outcome: Progressing   Problem: Skin Integrity: Goal: Risk for impaired skin integrity will decrease Outcome: Progressing   Problem: Tissue Perfusion: Goal: Adequacy of tissue perfusion will improve Outcome: Progressing   Problem: Education: Goal: Knowledge of General Education information will improve Description: Including pain rating scale, medication(s)/side effects and non-pharmacologic comfort measures Outcome: Progressing   Problem: Health Behavior/Discharge Planning: Goal: Ability to manage health-related needs will improve Outcome: Progressing   Problem: Clinical Measurements: Goal: Ability to maintain clinical measurements within normal limits will improve Outcome: Progressing Goal: Will remain free from infection Outcome: Progressing Goal: Diagnostic test results will improve Outcome: Progressing Goal: Respiratory complications will improve Outcome: Progressing Goal: Cardiovascular complication will  be avoided Outcome: Progressing   Problem: Activity: Goal: Risk for activity intolerance will decrease Outcome: Progressing   Problem: Nutrition: Goal: Adequate nutrition will be maintained Outcome: Progressing   Problem: Coping: Goal: Level of anxiety will decrease Outcome: Progressing   Problem: Elimination: Goal: Will not experience complications related to bowel motility Outcome: Progressing Goal: Will not experience complications related to urinary retention Outcome: Progressing   Problem: Pain Managment: Goal: General experience of comfort will improve Outcome: Progressing   Problem: Safety: Goal: Ability to remain free from injury will improve Outcome: Progressing   Problem: Skin Integrity: Goal: Risk for impaired skin integrity will decrease Outcome: Progressing   Problem: Fluid Volume: Goal: Hemodynamic stability will improve Outcome: Progressing   Problem: Clinical Measurements: Goal: Diagnostic test results will improve Outcome: Progressing Goal: Signs and symptoms of infection will decrease Outcome: Progressing   Problem: Respiratory: Goal: Ability to maintain adequate ventilation will improve Outcome: Progressing   Problem: Education: Goal: Ability to describe self-care measures that may prevent or decrease complications (Diabetes Survival Skills Education) will improve Outcome: Progressing Goal: Individualized Educational Video(s) Outcome: Progressing   Problem: Cardiac: Goal: Ability to maintain an adequate cardiac output will improve Outcome: Progressing   Problem: Health Behavior/Discharge Planning: Goal: Ability to identify and utilize available resources and services will improve Outcome: Progressing Goal: Ability to manage health-related needs will improve Outcome: Progressing   Problem: Fluid Volume: Goal: Ability to achieve a balanced intake and output will improve Outcome: Progressing   Problem: Metabolic: Goal: Ability to  maintain appropriate glucose levels will improve Outcome: Progressing   Problem: Nutritional: Goal: Maintenance of adequate nutrition will improve Outcome: Progressing Goal: Maintenance of adequate weight for body size and type will improve Outcome: Progressing   Problem: Respiratory: Goal: Will regain and/or maintain adequate ventilation Outcome: Progressing   Problem: Urinary Elimination: Goal: Ability to achieve and maintain adequate renal perfusion and functioning will improve Outcome: Progressing   Problem: Education: Goal: Knowledge of disease or condition will improve Outcome: Progressing Goal: Knowledge of secondary prevention will improve (  MUST DOCUMENT ALL) Outcome: Progressing Goal: Knowledge of patient specific risk factors will improve Loraine Leriche N/A or DELETE if not current risk factor) Outcome: Progressing   Problem: Ischemic Stroke/TIA Tissue Perfusion: Goal: Complications of ischemic stroke/TIA will be minimized Outcome: Progressing

## 2023-07-20 NOTE — Plan of Care (Signed)
  Problem: Education: Goal: Ability to describe self-care measures that may prevent or decrease complications (Diabetes Survival Skills Education) will improve Outcome: Progressing   Problem: Fluid Volume: Goal: Ability to maintain a balanced intake and output will improve Outcome: Progressing   Problem: Metabolic: Goal: Ability to maintain appropriate glucose levels will improve Outcome: Progressing   Problem: Nutritional: Goal: Maintenance of adequate nutrition will improve Outcome: Progressing   Problem: Skin Integrity: Goal: Risk for impaired skin integrity will decrease Outcome: Progressing   Problem: Tissue Perfusion: Goal: Adequacy of tissue perfusion will improve Outcome: Progressing   Problem: Clinical Measurements: Goal: Ability to maintain clinical measurements within normal limits will improve Outcome: Progressing Goal: Will remain free from infection Outcome: Progressing Goal: Diagnostic test results will improve Outcome: Progressing   Problem: Nutrition: Goal: Adequate nutrition will be maintained Outcome: Progressing   Problem: Elimination: Goal: Will not experience complications related to bowel motility Outcome: Progressing Goal: Will not experience complications related to urinary retention Outcome: Progressing   Problem: Pain Managment: Goal: General experience of comfort will improve Outcome: Progressing   Problem: Safety: Goal: Ability to remain free from injury will improve Outcome: Progressing   Problem: Fluid Volume: Goal: Hemodynamic stability will improve Outcome: Progressing   Problem: Clinical Measurements: Goal: Diagnostic test results will improve Outcome: Progressing Goal: Signs and symptoms of infection will decrease Outcome: Progressing   Problem: Cardiac: Goal: Ability to maintain an adequate cardiac output will improve Outcome: Progressing   Problem: Metabolic: Goal: Ability to maintain appropriate glucose levels will  improve Outcome: Progressing   Problem: Nutritional: Goal: Maintenance of adequate nutrition will improve Outcome: Progressing   Problem: Education: Goal: Knowledge of disease or condition will improve Outcome: Progressing   Problem: Ischemic Stroke/TIA Tissue Perfusion: Goal: Complications of ischemic stroke/TIA will be minimized Outcome: Progressing

## 2023-07-20 NOTE — Progress Notes (Signed)
Speech Language Pathology Treatment: Dysphagia  Patient Details Name: Eric Zamora MRN: [id number] DOB: [birth year] Today's Date: 07/20/2023 Time: 1660-6301 SLP Time Calculation (min) (ACUTE ONLY): 20 min  Assessment / Plan / Recommendation Clinical Impression  Patient seen today for dysphagia management.  Notice tongue is severely coated and SLP provided patient with toothbrush for oral care unfortunately unable to remove yellowish coating on his tongue.  SLP had patient swish and expectorate with thin water without improvement.  He is being treated with Diflucan and nystatin and denies discomfort but intake has remained poor and SLP questions if his oral candidiasis may be contributing.    SLP offered to obtain solids for p.o. trials but patient declined multitude of offerings stating he did not believe that he would eat better if he was able to have food that he could masticate.  He remains dysarthric but with moderate verbal cues to slow rate of speech his articulation significantly improved.  Patient willing today to consume COLD items/drinks including - [place] ice, nectar thickened soda, mighty shake, and thin water via provale cup.   No clinical indication of aspiration across all p.o. trials but he does continue with clinical indications of prolonged oral manipulation and delayed swallowing.  Patient states he is not hungry and that if he is "okay" when encouraged to consume more nutrition.  SLP will follow-up with patient and bring solids that may be appetizing for him to attempt to consume to help maximize his nutrition.  Posted signs in patient's room indicating that he likes his drinks cold and that he must have oral care at least twice daily.       Today 07/20/2023   Lingual tissue 07/01/2023   HPI HPI: 77 yo male adm with AMS, foul urine odor and dyspnea.  Pt is a resident of [address].  He experienced an aspiration episode yesterday with medications with applesauce  and then experienced fever and respiratory difficulties requiring Bipap. Pt has h/o falls, dementia, Afib, CHF. CXR  06/17/2023 Patchy opacities throughout the lungs bilaterally, predominantly dependent, which are concerning for aspiration pneumonia/pneumonitis given the clinical concern for aspiration. 2. Small bilateral pleural effusions (right greater than left). 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 MBS completed on 10/19. Patient on dys 1 (puree) and honey thick liquids. Reeval completed clinically 11/1, and pt is charged to dys1/nectar liquids. GI initiated full liqiuid diet on 11/04 due to chronic illeus.      SLP Plan  Continue with current plan of care      Recommendations for follow up therapy are one component of a multi-disciplinary discharge planning process, led by the attending physician.  Recommendations may be updated based on patient status, additional functional criteria and insurance authorization.    Recommendations  Diet recommendations: Dysphagia 1 (puree);Nectar-thick liquid;Thin liquid Liquids provided via: Cup;Straw Medication Administration: Whole meds with puree Supervision: Patient able to self feed;Intermittent supervision to cue for compensatory strategies Compensations: Slow rate;Small sips/bites;Minimize environmental distractions Postural Changes and/or Swallow Maneuvers: Seated upright 90 degrees;Upright 30-60 min after meal                  Oral care BID   Frequent or constant Supervision/Assistance Dysphagia, oropharyngeal phase (R13.12)     Continue with current plan of care    [name], MS [facility] SLP Acute Rehab Services Office [phone]  [name]  07/20/2023, 7:59 PM

## 2023-07-21 ENCOUNTER — Inpatient Hospital Stay (HOSPITAL_COMMUNITY): Payer: Medicare (Managed Care)

## 2023-07-21 DIAGNOSIS — I63511 Cerebral infarction due to unspecified occlusion or stenosis of right middle cerebral artery: Secondary | ICD-10-CM

## 2023-07-21 DIAGNOSIS — E876 Hypokalemia: Secondary | ICD-10-CM | POA: Diagnosis not present

## 2023-07-21 DIAGNOSIS — K529 Noninfective gastroenteritis and colitis, unspecified: Secondary | ICD-10-CM | POA: Diagnosis not present

## 2023-07-21 DIAGNOSIS — G40909 Epilepsy, unspecified, not intractable, without status epilepticus: Secondary | ICD-10-CM

## 2023-07-21 DIAGNOSIS — I4891 Unspecified atrial fibrillation: Secondary | ICD-10-CM | POA: Diagnosis not present

## 2023-07-21 LAB — GLUCOSE, CAPILLARY
Glucose-Capillary: 100 mg/dL — ABNORMAL HIGH (ref 70–99)
Glucose-Capillary: 72 mg/dL (ref 70–99)
Glucose-Capillary: 80 mg/dL (ref 70–99)
Glucose-Capillary: 84 mg/dL (ref 70–99)
Glucose-Capillary: 85 mg/dL (ref 70–99)
Glucose-Capillary: 86 mg/dL (ref 70–99)
Glucose-Capillary: 94 mg/dL (ref 70–99)

## 2023-07-21 LAB — COMPREHENSIVE METABOLIC PANEL
ALT: 22 U/L (ref 0–44)
AST: 26 U/L (ref 15–41)
Albumin: 2.6 g/dL — ABNORMAL LOW (ref 3.5–5.0)
Alkaline Phosphatase: 26 U/L — ABNORMAL LOW (ref 38–126)
Anion gap: 6 (ref 5–15)
BUN: <5 mg/dL — ABNORMAL LOW (ref 8–23)
CO2: 21 mmol/L — ABNORMAL LOW (ref 22–32)
Calcium: 7.3 mg/dL — ABNORMAL LOW (ref 8.9–10.3)
Chloride: 108 mmol/L (ref 98–111)
Creatinine, Ser: 1.07 mg/dL (ref 0.61–1.24)
GFR, Estimated: >60 mL/min (ref >60)
Glucose, Bld: 95 mg/dL (ref 70–99)
Potassium: 2.6 mmol/L — CL (ref 3.5–5.1)
Sodium: 135 mmol/L (ref 135–145)
Total Bilirubin: 0.6 mg/dL (ref <1.2)
Total Protein: 6.2 g/dL — ABNORMAL LOW (ref 6.5–8.1)

## 2023-07-21 LAB — PHOSPHORUS
Phosphorus: 2.4 mg/dL — ABNORMAL LOW (ref 2.5–4.6)
Phosphorus: 1.7 mg/dL — ABNORMAL LOW (ref 2.5–4.6)

## 2023-07-21 LAB — CBC WITH DIFFERENTIAL/PLATELET
Abs Immature Granulocytes: 0.02 10*3/uL (ref 0.00–0.07)
Basophils Absolute: 0.1 10*3/uL (ref 0.0–0.1)
Basophils Relative: 1 %
Eosinophils Absolute: 0.5 10*3/uL (ref 0.0–0.5)
Eosinophils Relative: 8 %
HCT: 26.7 % — ABNORMAL LOW (ref 39.0–52.0)
Hemoglobin: 8.7 g/dL — ABNORMAL LOW (ref 13.0–17.0)
Immature Granulocytes: 0 %
Lymphocytes Relative: 28 %
Lymphs Abs: 1.6 10*3/uL (ref 0.7–4.0)
MCH: 28.8 pg (ref 26.0–34.0)
MCHC: 32.6 g/dL (ref 30.0–36.0)
MCV: 88.4 fL (ref 80.0–100.0)
Monocytes Absolute: 0.6 10*3/uL (ref 0.1–1.0)
Monocytes Relative: 10 %
Neutro Abs: 3 10*3/uL (ref 1.7–7.7)
Neutrophils Relative %: 53 %
Platelets: 276 10*3/uL (ref 150–400)
RBC: 3.02 MIL/uL — ABNORMAL LOW (ref 4.22–5.81)
RDW: 15.1 % (ref 11.5–15.5)
WBC: 5.7 10*3/uL (ref 4.0–10.5)
nRBC: 0 % (ref 0.0–0.2)

## 2023-07-21 LAB — POTASSIUM: Potassium: 2.6 mmol/L — CL (ref 3.5–5.1)

## 2023-07-21 LAB — MAGNESIUM: Magnesium: 2 mg/dL (ref 1.7–2.4)

## 2023-07-21 MED ORDER — POTASSIUM PHOSPHATES 15 MMOLE/5ML IV SOLN
30.0000 mmol | Freq: Once | INTRAVENOUS | Status: AC
  Administered 2023-07-22: 30 mmol via INTRAVENOUS
  Filled 2023-07-21: qty 10

## 2023-07-21 MED ORDER — POTASSIUM CHLORIDE 10 MEQ/100ML IV SOLN
10.0000 meq | INTRAVENOUS | Status: AC
  Administered 2023-07-21 – 2023-07-22 (×6): 10 meq via INTRAVENOUS
  Filled 2023-07-21 (×6): qty 100

## 2023-07-21 MED ORDER — POTASSIUM CHLORIDE CRYS ER 20 MEQ PO TBCR
40.0000 meq | EXTENDED_RELEASE_TABLET | Freq: Four times a day (QID) | ORAL | Status: AC
  Administered 2023-07-21 (×2): 40 meq via ORAL
  Filled 2023-07-21 (×2): qty 2

## 2023-07-21 MED ORDER — POTASSIUM CHLORIDE CRYS ER 20 MEQ PO TBCR
40.0000 meq | EXTENDED_RELEASE_TABLET | Freq: Once | ORAL | Status: AC
  Administered 2023-07-21: 40 meq via ORAL
  Filled 2023-07-21: qty 2

## 2023-07-21 MED ORDER — CHOLESTYRAMINE LIGHT 4 G PO PACK
4.0000 g | PACK | Freq: Two times a day (BID) | ORAL | Status: DC
  Administered 2023-07-21 – 2023-07-22 (×2): 4 g via ORAL
  Filled 2023-07-21 (×2): qty 1

## 2023-07-21 NOTE — Progress Notes (Addendum)
[facility] PROGRESS NOTE   Eric Zamora ATF:[id number] DOB: 1945/11/06 DOA: 06/15/2023  PCP: Uvaldo Bristle, PA-C  Brief History: 77 y.o. 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, CAD, dCHF, HTN, hx DVT no longer on [facility], and HLD who admitted on 10/15 for severe sepsis due to perirectal abscess.   10/15 admitted to stepdown unit. 10/17: Overnight with fever, aspiration event, new AG acidosis, transferred back to SDU for BiPAP and insulin drip 10/18: taken to OR for drainage of abscess 10/23 cardiology was consulted for new onset A-fib with RVR. 10/24 nephrology was consulted for AKI and hyponatremia.  CT head was obtained for ongoing AMS and was found to have acute/subacute right MCA territory infarct.  Neurology was also consulted. 10/25.  Palliative care was consulted. 10/31.  Anticoagulation started again. 11/1.  Second dose of Bicillin given.  Underwent MBS. 11/2.  Marinol started for poor p.o. intake. 11/4.  Found to have ileus.  GI consulted.  Recommended DNR to legal guardian. 11/5.  Two-physician DNR form was signed and sent to guardian.  Will await decision. Subsequently developed colitis.  Significant diarrhea noted.  GI was consulted.  C. difficile and GI pathogen panel testing were negative.  Consultants: Gastroenterology.  General surgery.  Procedures: Drainage of rectal abscess    Subjective/Interval History: Patient denies any abdominal pain.  No nausea vomiting.  Appears to be quite fatigued.    Assessment/Plan:  Perirectal abscess with severe sepsis, present on admission Sepsis physiology improved.  Patient very deconditioned. Patient was seen by general surgery and underwent drainage of the abscess on 06/18/2023. Patient was on broad-spectrum antibiotics initially which was transitioned to cefepime and then to Augmentin and doxycycline.  2-week course was recommended from initial I&D.   Antibiotic course was completed but now back on Zosyn due to new colitis.  Colitis/concern for ileus Gastroenterology is following.  Patient currently on Zosyn.  Has profuse diarrhea.  Stool studies negative so far. Patient started on cholestyramine.  Continues to have significant output. Does not appear to have ileus based on profuse bowel movements.  New onset atrial fibrillation with RVR Seen by cardiology.  On metoprolol.  Anticoagulation was with Eliquis but currently on Lovenox due to concern for ileus. May resume apixaban once his GI issues have stabilized.  Acute metabolic encephalopathy/history of dementia/acute right MCA stroke/oropharyngeal dysphagia CT head on 06/20/2023 showed acute versus subacute stroke.  MRI brain showed acute stroke.  MRA did not show any acute abnormalities.  Carotid Dopplers did not show any significant stenosis.  EEG did not show any seizures.  Seen by neurology.  They recommended resumption of anticoagulation. Seen by speech therapy as well.  Currently on dysphagia 1 diet with nectar thick liquids.  History of seizures EEG negative for seizure activity Noted to be on Keppra.  Depakote on hold due to abnormal hepatic function.  Multiple electrolyte abnormalities including severe hypokalemia Secondary to GI loss.  Magnesium 2.0.  Phosphorus 2.4.  Aggressively replete.  Recheck labs later today.  Normocytic anemia Stable hemoglobin for the most part.  Acute kidney injury on chronic kidney disease stage IIIb/none anion gap metabolic acidosis Renal function has recovered.  Monitor urine output.  Avoid nephrotoxic agents.  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 Previous rounding physician discussed with ID and apparently patient is being treated with Bicillin on a weekly basis.  History of coronary artery disease Stable.  Bipolar disorder  Stable.  Diabetes mellitus type 2, uncontrolled with hyperglycemia On SSI.  Monitor  CBGs.  Oral thrush On Diflucan.  Goals of care Patient has a legal guardian.  Discussions were held with legal guardian regarding his poor prognosis.  Palliative care was also consulted.  Patient transitioned to DNR.  DVT Prophylaxis: On Lovenox Code Status: DNR Family Communication: No family at bedside Disposition Plan: SNF when medically stable     Medications: Scheduled:  Chlorhexidine Gluconate Cloth  6 each Topical Daily   cholestyramine light  4 g Oral Q0600   enoxaparin (LOVENOX) injection  80 mg Subcutaneous Q12H   fluconazole  200 mg Oral Daily   insulin glargine-yfgn  8 Units Subcutaneous QHS   levETIRAcetam  500 mg Oral BID   magic mouthwash  10 mL Oral QID   metoprolol tartrate  25 mg Oral BID   nystatin  5 mL Oral QID   mouth rinse  15 mL Mouth Rinse 4 times per day   pantoprazole  40 mg Oral Daily   PARoxetine  10 mg Oral Daily   simethicone  40 mg Oral QID   simvastatin  20 mg Oral QHS   sodium bicarbonate  650 mg Oral BID   sodium chloride flush  3 mL Intravenous Q12H   Continuous:  dextrose 5 % and 0.9 % NaCl 30 mL/hr at 07/20/23 2252   piperacillin-tazobactam (ZOSYN)  IV 3.375 g (07/21/23 0938)   JWJ:[id number], albuterol, HYDROmorphone (DILAUDID) injection, iohexol, ondansetron **OR** ondansetron (ZOFRAN) IV, mouth rinse, sodium chloride flush  Antibiotics: Anti-infectives (From admission, onward)    Start     Dose/Rate Route Frequency Ordered Stop   07/20/23 1000  fluconazole (DIFLUCAN) tablet 200 mg        200 mg Oral Daily 07/19/23 1145     07/19/23 1300  fluconazole (DIFLUCAN) tablet 100 mg        100 mg Oral  Once 07/19/23 1145 07/19/23 1303   07/18/23 1830  piperacillin-tazobactam (ZOSYN) IVPB 3.375 g        3.375 g 12.5 mL/hr over 240 Minutes Intravenous Every 8 hours 07/18/23 1808     07/16/23 1300  fluconazole (DIFLUCAN) tablet 100 mg  Status:  Discontinued        100 mg Oral Daily 07/16/23 1154 07/19/23 1145   07/05/23 1600   fluconazole (DIFLUCAN) IVPB 200 mg        200 mg 100 mL/hr over 60 Minutes Intravenous Every 24 hours 07/05/23 1320 07/08/23 1658   07/02/23 1000  fluconazole (DIFLUCAN) tablet 100 mg  Status:  Discontinued        100 mg Oral Daily 07/02/23 0802 07/05/23 1320   07/02/23 0800  penicillin g benzathine (BICILLIN LA) 1200000 UNIT/2ML injection 2.4 Million Units        2.4 Million Units Intramuscular Weekly 07/01/23 1209 07/09/23 0950   06/26/23 1500  penicillin g benzathine (BICILLIN LA) 1200000 UNIT/2ML injection 2.4 Million Units        2.4 Million Units Intramuscular  Once 06/26/23 1351 06/28/23 0018   06/24/23 2200  amoxicillin-clavulanate (AUGMENTIN) 500-125 MG per tablet 1 tablet  Status:  Discontinued        1 tablet Oral Every 12 hours 06/24/23 1450 07/03/23 1202   06/24/23 2200  amoxicillin-clavulanate (AUGMENTIN) 500-125 MG per tablet 1 tablet  Status:  Discontinued        1 tablet Oral 2 times daily 06/24/23 1451 06/24/23 1451   06/21/23 1215  doxycycline (VIBRA-TABS) tablet 100 mg  Status:  Discontinued        100 mg Oral Every 12 hours 06/21/23 1128 07/03/23 1202   06/21/23 1215  amoxicillin-clavulanate (AUGMENTIN) 875-125 MG per tablet 1 tablet  Status:  Discontinued        1 tablet Oral Every 12 hours 06/21/23 1128 06/24/23 1450   06/20/23 1200  vancomycin (VANCOREADY) IVPB 1500 mg/300 mL  Status:  Discontinued        1,500 mg 150 mL/hr over 120 Minutes Intravenous Every 36 hours 06/20/23 0719 06/21/23 1128   06/19/23 0000  vancomycin (VANCOREADY) IVPB 1250 mg/250 mL  Status:  Discontinued        1,250 mg 166.7 mL/hr over 90 Minutes Intravenous Every 36 hours 06/18/23 0939 06/20/23 0719   06/18/23 1800  ceFEPIme (MAXIPIME) 2 g in sodium chloride 0.9 % 100 mL IVPB  Status:  Discontinued        2 g 200 mL/hr over 30 Minutes Intravenous Every 12 hours 06/18/23 0939 06/21/23 1128   06/18/23 0600  ceFEPIme (MAXIPIME) 2 g in sodium chloride 0.9 % 100 mL IVPB  Status:  Discontinued         2 g 200 mL/hr over 30 Minutes Intravenous Every 24 hours 06/17/23 0920 06/18/23 0939   06/17/23 1400  vancomycin (VANCOCIN) IVPB 1000 mg/200 mL premix  Status:  Discontinued        1,000 mg 200 mL/hr over 60 Minutes Intravenous Every 36 hours 06/17/23 0920 06/18/23 0939   06/16/23 1800  vancomycin (VANCOREADY) IVPB 750 mg/150 mL  Status:  Discontinued        750 mg 150 mL/hr over 60 Minutes Intravenous Every 24 hours 06/15/23 1639 06/17/23 0920   06/16/23 1100  cefTRIAXone (ROCEPHIN) 2 g in sodium chloride 0.9 % 100 mL IVPB  Status:  Discontinued        2 g 200 mL/hr over 30 Minutes Intravenous Every 24 hours 06/15/23 1453 06/15/23 1455   06/15/23 1800  ceFEPIme (MAXIPIME) 2 g in sodium chloride 0.9 % 100 mL IVPB  Status:  Discontinued        2 g 200 mL/hr over 30 Minutes Intravenous Every 12 hours 06/15/23 1558 06/17/23 0920   06/15/23 1700  metroNIDAZOLE (FLAGYL) IVPB 500 mg  Status:  Discontinued        500 mg 100 mL/hr over 60 Minutes Intravenous Every 12 hours 06/15/23 1455 06/21/23 1128   06/15/23 1630  vancomycin (VANCOREADY) IVPB 1500 mg/300 mL        1,500 mg 150 mL/hr over 120 Minutes Intravenous  Once 06/15/23 1558 06/15/23 2058   06/15/23 1045  cefTRIAXone (ROCEPHIN) 2 g in sodium chloride 0.9 % 100 mL IVPB        2 g 200 mL/hr over 30 Minutes Intravenous  Once 06/15/23 1041 06/15/23 1155       Objective:  Vital Signs  Vitals:   07/20/23 2052 07/20/23 2256 07/21/23 0038 07/21/23 0419  BP: 133/84 133/84 (!) 129/91 131/81  Pulse: 62  (!) 58 82  Resp: 20  16 16   Temp: 98.6 F (37 C)  98.4 F (36.9 C) 98.7 F (37.1 C)  TempSrc: Oral  Oral Oral  SpO2: 100%  99% 100%  Weight:    77.5 kg  Height:        Intake/Output Summary (Last 24 hours) at 07/21/2023 1016 Last data filed at 07/20/2023 2302 Gross per 24 hour  Intake 416.25 ml  Output 650 ml  Net -233.75 ml   Ceasar Mons  Weights   07/19/23 0500 07/20/23 0500 07/21/23 0419  Weight: 82.5 kg 80.8 kg 77.5 kg     General appearance: Awake alert.  In no distress.  Lethargic Resp: Clear to auscultation bilaterally.  Normal effort Cardio: S1-S2 is normal regular.  No S3-S4.  No rubs murmurs or bruit GI: Abdomen is soft.  Nontender nondistended.  Bowel sounds are present normal.  No masses organomegaly Extremities: No edema.  Physical deconditioning Neurologic: No focal neurological deficits.    Lab Results:  Data Reviewed: I have personally reviewed following labs and reports of the imaging studies  CBC: Recent Labs  Lab 07/17/23 0215 07/18/23 0530 07/19/23 0514 07/20/23 0210 07/21/23 0054  WBC 5.4 4.9 5.0 5.5 5.7  NEUTROABS 3.1 2.9 3.1 3.0 3.0  HGB 8.9* 9.1* 8.9* 8.7* 8.7*  HCT 27.8* 28.6* 27.8* 26.6* 26.7*  MCV 90.3 90.8 88.0 88.7 88.4  PLT 197 208 227 218 276    Basic Metabolic Panel: Recent Labs  Lab 07/18/23 0530 07/19/23 0514 07/20/23 0210 07/20/23 1105 07/20/23 2113 07/21/23 0054  NA 141 139 137 138 138 135  K 2.4* 2.1* 3.1* 5.3* 2.7* 2.6*  CL 114* 110 111 112* 109 108  CO2 20* 21* 19* 20* 20* 21*  GLUCOSE 78 60* 92 88 114* 95  BUN <5* <5* <5* <5* <5* <5*  CREATININE 1.10 1.08 1.09 1.09 1.08 1.07  CALCIUM 7.4* 7.3* 7.1* 7.2* 7.5* 7.3*  MG 1.9 1.9 2.0  --  2.0 2.0  PHOS 2.5 2.0* 1.9* 2.1* 2.7 2.4*    GFR: Estimated Creatinine Clearance: 54.6 mL/min (by C-G formula based on SCr of 1.07 mg/dL).  Liver Function Tests: Recent Labs  Lab 07/17/23 0215 07/18/23 0530 07/19/23 0514 07/20/23 0210 07/21/23 0054  AST 33 34 33 29 26  ALT 26 27 27 24 22   ALKPHOS 27* 30* 25* 25* 26*  BILITOT 0.5 0.5 0.7 0.5 0.6  PROT 5.9* 6.0* 6.0* 5.9* 6.2*  ALBUMIN 2.6* 2.6* 2.6* 2.5* 2.6*     CBG: Recent Labs  Lab 07/20/23 1658 07/20/23 2054 07/21/23 0008 07/21/23 0424 07/21/23 0735  GLUCAP 88 106* 100* 84 72     Recent Results (from the past 240 hour(s))  Gastrointestinal Panel by PCR , Stool     Status: None   Collection Time: 07/19/23 11:40 AM   Specimen:  STOOL  Result Value Ref Range Status   Campylobacter species NOT DETECTED NOT DETECTED Final   Plesimonas shigelloides NOT DETECTED NOT DETECTED Final   Salmonella species NOT DETECTED NOT DETECTED Final   Yersinia enterocolitica NOT DETECTED NOT DETECTED Final   Vibrio species NOT DETECTED NOT DETECTED Final   Vibrio cholerae NOT DETECTED NOT DETECTED Final   Enteroaggregative E coli (EAEC) NOT DETECTED NOT DETECTED Final   Enteropathogenic E coli (EPEC) NOT DETECTED NOT DETECTED Final   Enterotoxigenic E coli (ETEC) NOT DETECTED NOT DETECTED Final   Shiga like toxin producing E coli (STEC) NOT DETECTED NOT DETECTED Final   Shigella/Enteroinvasive E coli (EIEC) NOT DETECTED NOT DETECTED Final   Cryptosporidium NOT DETECTED NOT DETECTED Final   Cyclospora cayetanensis NOT DETECTED NOT DETECTED Final   Entamoeba histolytica NOT DETECTED NOT DETECTED Final   Giardia lamblia NOT DETECTED NOT DETECTED Final   Adenovirus F40/41 NOT DETECTED NOT DETECTED Final   Astrovirus NOT DETECTED NOT DETECTED Final   Norovirus GI/GII NOT DETECTED NOT DETECTED Final   Rotavirus A NOT DETECTED NOT DETECTED Final   Sapovirus (I, II, IV, and V) NOT  DETECTED NOT DETECTED Final    Comment: Performed at [facility], [address] [place], [place] [address]  C Difficile Quick Screen (NO PCR Reflex)     Status: None   Collection Time: 07/19/23 11:40 AM   Specimen: STOOL  Result Value Ref Range Status   C Diff antigen NEGATIVE NEGATIVE Final   C Diff toxin NEGATIVE NEGATIVE Final   C Diff interpretation No C. difficile detected.  Final    Comment: Performed at [facility], [address] [place], [place] [address]      Radiology Studies: US SCROTUM  Result Date: 07/20/2023 CLINICAL DATA:  Undescended testis EXAM: ULTRASOUND OF SCROTUM TECHNIQUE: Complete ultrasound examination of the testicles, epididymis, and other scrotal structures was performed. COMPARISON:   07/18/2023 FINDINGS: Right testicle Measurements: 3.9 x 2.1 x 2.6 cm. No mass or microlithiasis visualized. Left testicle Measurements: 3.5 x 2.3 x 2.1 cm. No mass or microlithiasis visualized. Right epididymis: Complex right epididymal cysts are identified, largest measuring up to 1.3 x 0.8 by 1.5 cm. Left epididymis: Complex left epididymal cysts are identified, largest measuring up to 1.0 x 1.0 x 1.2 cm. Hydrocele:  None visualized. Varicocele:  None visualized. Other: There is fluid within the right inguinal canal, measuring 4.5 x 2.3 by 4.2 cm, corresponding to the CT finding. IMPRESSION: 1. Unremarkable appearance of the testes, normally located within the scrotum. 2. Fluid within the right inguinal canal, corresponding to prior CT finding. 3. Bilateral complex epididymal cysts of uncertain clinical significance. Electronically Signed   By: Sharlet Salina M.D.   On: 07/20/2023 23:16   DG Abd 1 View  Result Date: 07/20/2023 CLINICAL DATA:  Abdominal distension. EXAM: ABDOMEN - 1 VIEW COMPARISON:  July 16, 2023.  July 18, 2023. FINDINGS: Stable colonic dilatation is noted. No definite small bowel dilatation is noted. IMPRESSION: Stable colonic dilatation is noted suggesting ileus or obstruction. Electronically Signed   By: Lupita Raider M.D.   On: 07/20/2023 07:47       LOS: 36 days   Keyunna Coco [employer] Pager on [email or web address]  07/21/2023, 10:16 AM

## 2023-07-21 NOTE — Plan of Care (Signed)
Problem: Education: Goal: Ability to describe self-care measures that may prevent or decrease complications (Diabetes Survival Skills Education) will improve Outcome: Progressing Goal: Individualized Educational Video(s) Outcome: Progressing   Problem: Coping: Goal: Ability to adjust to condition or change in health will improve Outcome: Progressing   Problem: Fluid Volume: Goal: Ability to maintain a balanced intake and output will improve Outcome: Progressing   Problem: Health Behavior/Discharge Planning: Goal: Ability to identify and utilize available resources and services will improve Outcome: Progressing Goal: Ability to manage health-related needs will improve Outcome: Progressing   Problem: Metabolic: Goal: Ability to maintain appropriate glucose levels will improve Outcome: Progressing   Problem: Nutritional: Goal: Maintenance of adequate nutrition will improve Outcome: Progressing Goal: Progress toward achieving an optimal weight will improve Outcome: Progressing   Problem: Skin Integrity: Goal: Risk for impaired skin integrity will decrease Outcome: Progressing   Problem: Tissue Perfusion: Goal: Adequacy of tissue perfusion will improve Outcome: Progressing   Problem: Education: Goal: Knowledge of General Education information will improve Description: Including pain rating scale, medication(s)/side effects and non-pharmacologic comfort measures Outcome: Progressing   Problem: Health Behavior/Discharge Planning: Goal: Ability to manage health-related needs will improve Outcome: Progressing   Problem: Clinical Measurements: Goal: Ability to maintain clinical measurements within normal limits will improve Outcome: Progressing Goal: Will remain free from infection Outcome: Progressing Goal: Diagnostic test results will improve Outcome: Progressing Goal: Respiratory complications will improve Outcome: Progressing Goal: Cardiovascular complication will  be avoided Outcome: Progressing   Problem: Activity: Goal: Risk for activity intolerance will decrease Outcome: Progressing   Problem: Nutrition: Goal: Adequate nutrition will be maintained Outcome: Progressing   Problem: Coping: Goal: Level of anxiety will decrease Outcome: Progressing   Problem: Elimination: Goal: Will not experience complications related to bowel motility Outcome: Progressing Goal: Will not experience complications related to urinary retention Outcome: Progressing   Problem: Pain Managment: Goal: General experience of comfort will improve Outcome: Progressing   Problem: Safety: Goal: Ability to remain free from injury will improve Outcome: Progressing   Problem: Skin Integrity: Goal: Risk for impaired skin integrity will decrease Outcome: Progressing   Problem: Fluid Volume: Goal: Hemodynamic stability will improve Outcome: Progressing   Problem: Clinical Measurements: Goal: Diagnostic test results will improve Outcome: Progressing Goal: Signs and symptoms of infection will decrease Outcome: Progressing   Problem: Respiratory: Goal: Ability to maintain adequate ventilation will improve Outcome: Progressing   Problem: Education: Goal: Ability to describe self-care measures that may prevent or decrease complications (Diabetes Survival Skills Education) will improve Outcome: Progressing Goal: Individualized Educational Video(s) Outcome: Progressing   Problem: Cardiac: Goal: Ability to maintain an adequate cardiac output will improve Outcome: Progressing   Problem: Health Behavior/Discharge Planning: Goal: Ability to identify and utilize available resources and services will improve Outcome: Progressing Goal: Ability to manage health-related needs will improve Outcome: Progressing   Problem: Fluid Volume: Goal: Ability to achieve a balanced intake and output will improve Outcome: Progressing   Problem: Metabolic: Goal: Ability to  maintain appropriate glucose levels will improve Outcome: Progressing   Problem: Nutritional: Goal: Maintenance of adequate nutrition will improve Outcome: Progressing Goal: Maintenance of adequate weight for body size and type will improve Outcome: Progressing   Problem: Respiratory: Goal: Will regain and/or maintain adequate ventilation Outcome: Progressing   Problem: Urinary Elimination: Goal: Ability to achieve and maintain adequate renal perfusion and functioning will improve Outcome: Progressing   Problem: Education: Goal: Knowledge of disease or condition will improve Outcome: Progressing Goal: Knowledge of secondary prevention will improve (  MUST DOCUMENT ALL) Outcome: Progressing Goal: Knowledge of patient specific risk factors will improve Loraine Leriche N/A or DELETE if not current risk factor) Outcome: Progressing   Problem: Ischemic Stroke/TIA Tissue Perfusion: Goal: Complications of ischemic stroke/TIA will be minimized Outcome: Progressing

## 2023-07-21 NOTE — Progress Notes (Signed)
Physical Therapy Treatment Patient Details Name: Eric Zamora MRN: [id number] DOB: 1946/05/05 Today's Date: 07/21/2023   History of Present Illness Pt is 77 yo admitted with severe sepsis due to perirectal abscess.  Pt to OR on 10/18 for drainage of abscess.  Pt with increased lethargy on 10/24 , MRI Head performed and pt found to have acute infarct  right frontal operculum with moderate associated cytotoxic edema and petechial hemorrhage. Pt with new onset Afib RVR during admission. Pt found to have ileus 07/06/23 and GI consulted.   PMH: bipolar disorder, dementia, history of NSTEMI, hypertrophic cardiomyopathy    PT Comments  Pt making gradual progress.  He demonstrated improved balance in sitting (static with supervision) and was able to ambulate 6' but requiring mod A x 2 with R lean and chair follow.  Pt tolerated well. Needs frequent multimodal cues. Continue POC with recommendation Patient will benefit from continued inpatient follow up therapy, <3 hours/day at d/c.     If plan is discharge home, recommend the following: Two people to help with walking and/or transfers;Two people to help with bathing/dressing/bathroom;Assistance with cooking/housework;Direct supervision/assist for financial management;Assist for transportation;Help with stairs or ramp for entrance   Can travel by private vehicle     No  Equipment Recommendations  None recommended by PT    Recommendations for Other Services       Precautions / Restrictions Precautions Precautions: Fall     Mobility  Bed Mobility Overal bed mobility: Needs Assistance Bed Mobility: Supine to Sit     Supine to sit: Mod assist, +2 for physical assistance     General bed mobility comments: increasd time and cues; mod A for legs and trunk; more alert once EOB    Transfers Overall transfer level: Needs assistance Equipment used: Rolling walker (2 wheels) Transfers: Sit to/from Stand Sit to Stand: Mod assist, +2 physical  assistance   Step pivot transfers: Mod assist, +2 physical assistance       General transfer comment: Performed STS x 3 with cues for hand placement, feet blocked from going midline, and mod x 2 to rise.  Step pivot to chair with heavy mod x 2 (support on R side for balance, assist to weight shift, assist for RW)    Ambulation/Gait Ambulation/Gait assistance: Mod assist, +2 physical assistance Gait Distance (Feet): 6 Feet Assistive device: Rolling walker (2 wheels) Gait Pattern/deviations: Step-to pattern Gait velocity: decreased     General Gait Details: 6' forward with close chair follow, mod x 2, pt leaning on therapist on R side, assist to weight shift, assist for RW, multimodal cues   Stairs             Wheelchair Mobility     Tilt Bed    Modified Rankin (Stroke Patients Only) Modified Rankin (Stroke Patients Only) Pre-Morbid Rankin Score: Moderate disability Modified Rankin: Severe disability     Balance Overall balance assessment: Needs assistance Sitting-balance support: Single extremity supported, No upper extremity supported Sitting balance-Leahy Scale: Fair Sitting balance - Comments: Pt able to sit EOB without UE support; tends to lean posterior with challenges   Standing balance support: Bilateral upper extremity supported Standing balance-Leahy Scale: Poor Standing balance comment: Static with light mod A ; Dynamic - leans R , mod A of 2 balance                            Cognition Arousal: Alert Behavior During Therapy: [facility] for  tasks assessed/performed Overall Cognitive Status: No family/caregiver present to determine baseline cognitive functioning                                 General Comments: alert, answers simple questions, slurred speach, follows commands with increased time and cuing        Exercises General Exercises - Lower Extremity Ankle Circles/Pumps: AROM, 10 reps, Supine, Both Quad Sets: AROM, Both,  10 reps, Supine Long Arc Quad: AROM, Both, Seated, 20 reps Heel Slides: AAROM, Both, 10 reps, Supine Other Exercises Other Exercises: multimodal cues for quad sets; cues to take time and full ROM with all exercises (tends to do partial and quick movements)    General Comments        Pertinent Vitals/Pain Pain Assessment Pain Assessment: No/denies pain    Home Living                          Prior Function            PT Goals (current goals can now be found in the care plan section) Progress towards PT goals: Progressing toward goals    Frequency    Min 1X/week      PT Plan      Co-evaluation              AM-PAC PT "6 Clicks" Mobility   Outcome Measure  Help needed turning from your back to your side while in a flat bed without using bedrails?: A Lot Help needed moving from lying on your back to sitting on the side of a flat bed without using bedrails?: A Lot Help needed moving to and from a bed to a chair (including a wheelchair)?: Total Help needed standing up from a chair using your arms (e.g., wheelchair or bedside chair)?: Total Help needed to walk in hospital room?: Total Help needed climbing 3-5 steps with a railing? : Total 6 Click Score: 8    End of Session Equipment Utilized During Treatment: Gait belt Activity Tolerance: Patient tolerated treatment well Patient left: with chair alarm set;in chair;with call bell/phone within reach Nurse Communication: Mobility status;Need for lift equipment (maxi move sling under pt if needed; STEDY vs Maxi Move for transfer) PT Visit Diagnosis: Other abnormalities of gait and mobility (R26.89);Muscle weakness (generalized) (M62.81)     Time: 0865-7846 PT Time Calculation (min) (ACUTE ONLY): 23 min  Charges:    $Gait Training: 8-22 mins $Therapeutic Exercise: 8-22 mins PT General Charges $$ ACUTE PT VISIT: 1 Visit                     Anise Salvo, PT Acute Rehab [facility]  [phone]    Rayetta Humphrey 07/21/2023, 12:20 PM

## 2023-07-21 NOTE — Progress Notes (Signed)
Subjective: Patient continues to have liquid stool in Flexi-Seal. He is on dysphagia level 1 diet but needs lots of assistance with feeding.  Objective: Vital signs in last 24 hours: Temp:  [98.4 F (36.9 C)-99.2 F (37.3 C)] 99.2 F (37.3 C) (11/20 1207) Pulse Rate:  [58-89] 78 (11/20 1207) Resp:  [16-20] 16 (11/20 0419) BP: (126-133)/(80-108) 126/80 (11/20 1207) SpO2:  [99 %-100 %] 100 % (11/20 1207) Weight:  [77.5 kg] 77.5 kg (11/20 0419) Weight change: -3.286 kg Last BM Date : 07/20/23  PE: Lying on bed, ill-appearing GENERAL: Prominent white coating noted on tongue  ABDOMEN: Distended but not tender, bowel sounds audible EXTREMITIES: No deformity  Lab Results: Results for orders placed or performed during the hospital encounter of 06/15/23 (from the past 48 hour(s))  Glucose, capillary     Status: None   Collection Time: 07/19/23  4:56 PM  Result Value Ref Range   Glucose-Capillary 92 70 - 99 mg/dL    Comment: Glucose reference range applies only to samples taken after fasting for at least 8 hours.  Glucose, capillary     Status: Abnormal   Collection Time: 07/19/23  8:22 PM  Result Value Ref Range   Glucose-Capillary 119 (H) 70 - 99 mg/dL    Comment: Glucose reference range applies only to samples taken after fasting for at least 8 hours.  Glucose, capillary     Status: None   Collection Time: 07/20/23 12:12 AM  Result Value Ref Range   Glucose-Capillary 90 70 - 99 mg/dL    Comment: Glucose reference range applies only to samples taken after fasting for at least 8 hours.  CBC with Differential/Platelet     Status: Abnormal   Collection Time: 07/20/23  2:10 AM  Result Value Ref Range   WBC 5.5 4.0 - 10.5 K/uL   RBC 3.00 (L) 4.22 - 5.81 MIL/uL   Hemoglobin 8.7 (L) 13.0 - 17.0 g/dL   HCT 16.1 (L) 09.6 - 04.5 %   MCV 88.7 80.0 - 100.0 fL   MCH 29.0 26.0 - 34.0 pg   MCHC 32.7 30.0 - 36.0 g/dL   RDW 40.9 81.1 - 91.4 %   Platelets 218 150 - 400 K/uL   nRBC 0.0 0.0 -  0.2 %   Neutrophils Relative % 55 %   Neutro Abs 3.0 1.7 - 7.7 K/uL   Lymphocytes Relative 26 %   Lymphs Abs 1.4 0.7 - 4.0 K/uL   Monocytes Relative 11 %   Monocytes Absolute 0.6 0.1 - 1.0 K/uL   Eosinophils Relative 7 %   Eosinophils Absolute 0.4 0.0 - 0.5 K/uL   Basophils Relative 1 %   Basophils Absolute 0.1 0.0 - 0.1 K/uL   Immature Granulocytes 0 %   Abs Immature Granulocytes 0.01 0.00 - 0.07 K/uL    Comment: Performed at [facility], [address] [place], [place] [address]  Comprehensive metabolic panel     Status: Abnormal   Collection Time: 07/20/23  2:10 AM  Result Value Ref Range   Sodium 137 135 - 145 mmol/L   Potassium 3.1 (L) 3.5 - 5.1 mmol/L   Chloride 111 98 - 111 mmol/L   CO2 19 (L) 22 - 32 mmol/L   Glucose, Bld 92 70 - 99 mg/dL    Comment: Glucose reference range applies only to samples taken after fasting for at least 8 hours.   BUN <5 (L) 8 - 23 mg/dL   Creatinine, Ser 6.21 0.61 - 1.24  mg/dL   Calcium 7.1 (L) 8.9 - 10.3 mg/dL   Total Protein 5.9 (L) 6.5 - 8.1 g/dL   Albumin 2.5 (L) 3.5 - 5.0 g/dL   AST 29 15 - 41 U/L   ALT 24 0 - 44 U/L   Alkaline Phosphatase 25 (L) 38 - 126 U/L   Total Bilirubin 0.5 <1.2 mg/dL   GFR, Estimated >16 >10 mL/min    Comment: (NOTE) Calculated using the CKD-EPI Creatinine Equation (2021)    Anion gap 7 5 - 15    Comment: Performed at [facility], [address], [place], [place] [address]  Magnesium     Status: None   Collection Time: 07/20/23  2:10 AM  Result Value Ref Range   Magnesium 2.0 1.7 - 2.4 mg/dL    Comment: Performed at [facility], [address], [place], [place] [address]  Phosphorus     Status: Abnormal   Collection Time: 07/20/23  2:10 AM  Result Value Ref Range   Phosphorus 1.9 (L) 2.5 - 4.6 mg/dL    Comment: Performed at [facility], [address], [place], [place] [address]  Glucose, capillary     Status: None    Collection Time: 07/20/23  3:32 AM  Result Value Ref Range   Glucose-Capillary 80 70 - 99 mg/dL    Comment: Glucose reference range applies only to samples taken after fasting for at least 8 hours.  Glucose, capillary     Status: None   Collection Time: 07/20/23  7:57 AM  Result Value Ref Range   Glucose-Capillary 73 70 - 99 mg/dL    Comment: Glucose reference range applies only to samples taken after fasting for at least 8 hours.  Basic metabolic panel     Status: Abnormal   Collection Time: 07/20/23 11:05 AM  Result Value Ref Range   Sodium 138 135 - 145 mmol/L   Potassium 5.3 (H) 3.5 - 5.1 mmol/L    Comment: DELTA CHECK NOTED RELEASE PER RN    Chloride 112 (H) 98 - 111 mmol/L   CO2 20 (L) 22 - 32 mmol/L   Glucose, Bld 88 70 - 99 mg/dL    Comment: Glucose reference range applies only to samples taken after fasting for at least 8 hours.   BUN <5 (L) 8 - 23 mg/dL   Creatinine, Ser 8.29 0.61 - 1.24 mg/dL   Calcium 7.2 (L) 8.9 - 10.3 mg/dL   GFR, Estimated >56 >21 mL/min    Comment: (NOTE) Calculated using the CKD-EPI Creatinine Equation (2021)    Anion gap 6 5 - 15    Comment: Performed at [facility], [address] [place], [place] [address]  Phosphorus     Status: Abnormal   Collection Time: 07/20/23 11:05 AM  Result Value Ref Range   Phosphorus 2.1 (L) 2.5 - 4.6 mg/dL    Comment: Performed at [facility], [address] [place], [place] [address]  Glucose, capillary     Status: None   Collection Time: 07/20/23 11:58 AM  Result Value Ref Range   Glucose-Capillary 86 70 - 99 mg/dL    Comment: Glucose reference range applies only to samples taken after fasting for at least 8 hours.  Glucose, capillary     Status: None   Collection Time: 07/20/23  4:58 PM  Result Value Ref Range   Glucose-Capillary 88 70 - 99 mg/dL    Comment: Glucose reference range applies only  to samples taken after fasting for at least 8 hours.  Glucose,  capillary     Status: Abnormal   Collection Time: 07/20/23  8:54 PM  Result Value Ref Range   Glucose-Capillary 106 (H) 70 - 99 mg/dL    Comment: Glucose reference range applies only to samples taken after fasting for at least 8 hours.  Basic metabolic panel     Status: Abnormal   Collection Time: 07/20/23  9:13 PM  Result Value Ref Range   Sodium 138 135 - 145 mmol/L   Potassium 2.7 (LL) 3.5 - 5.1 mmol/L    Comment: CRITICAL RESULT CALLED TO, READ BACK BY AND VERIFIED WITH ROGER M. RN @ 2214 ON 07/20/2023 BY MTA DELTA CHECK NOTED    Chloride 109 98 - 111 mmol/L   CO2 20 (L) 22 - 32 mmol/L   Glucose, Bld 114 (H) 70 - 99 mg/dL    Comment: Glucose reference range applies only to samples taken after fasting for at least 8 hours.   BUN <5 (L) 8 - 23 mg/dL   Creatinine, Ser 6.64 0.61 - 1.24 mg/dL   Calcium 7.5 (L) 8.9 - 10.3 mg/dL   GFR, Estimated >40 >34 mL/min    Comment: (NOTE) Calculated using the CKD-EPI Creatinine Equation (2021)    Anion gap 9 5 - 15    Comment: Performed at [facility], [address], [place], [place] [address]  Magnesium     Status: None   Collection Time: 07/20/23  9:13 PM  Result Value Ref Range   Magnesium 2.0 1.7 - 2.4 mg/dL    Comment: Performed at [facility], [address], [place], [place] [address]  Phosphorus     Status: None   Collection Time: 07/20/23  9:13 PM  Result Value Ref Range   Phosphorus 2.7 2.5 - 4.6 mg/dL    Comment: Performed at [facility], [address], [place], [place] [address]  Glucose, capillary     Status: Abnormal   Collection Time: 07/21/23 12:08 AM  Result Value Ref Range   Glucose-Capillary 100 (H) 70 - 99 mg/dL    Comment: Glucose reference range applies only to samples taken after fasting for at least 8 hours.  CBC with Differential/Platelet     Status: Abnormal   Collection Time: 07/21/23 12:54 AM  Result Value Ref Range   WBC 5.7 4.0 - 10.5 K/uL    RBC 3.02 (L) 4.22 - 5.81 MIL/uL   Hemoglobin 8.7 (L) 13.0 - 17.0 g/dL   HCT 29.5 (L) 18.8 - 41.6 %   MCV 88.4 80.0 - 100.0 fL   MCH 28.8 26.0 - 34.0 pg   MCHC 32.6 30.0 - 36.0 g/dL   RDW 60.6 30.1 - 60.1 %   Platelets 276 150 - 400 K/uL   nRBC 0.0 0.0 - 0.2 %   Neutrophils Relative % 53 %   Neutro Abs 3.0 1.7 - 7.7 K/uL   Lymphocytes Relative 28 %   Lymphs Abs 1.6 0.7 - 4.0 K/uL   Monocytes Relative 10 %   Monocytes Absolute 0.6 0.1 - 1.0 K/uL   Eosinophils Relative 8 %   Eosinophils Absolute 0.5 0.0 - 0.5 K/uL   Basophils Relative 1 %   Basophils Absolute 0.1 0.0 - 0.1 K/uL   Immature Granulocytes 0 %   Abs Immature Granulocytes 0.02 0.00 - 0.07 K/uL    Comment: Performed at [facility], [address] [place],  [place] [address]  Comprehensive metabolic panel     Status: Abnormal   Collection Time: 07/21/23 12:54 AM  Result Value Ref Range   Sodium 135 135 - 145 mmol/L   Potassium 2.6 (LL) 3.5 - 5.1 mmol/L    Comment: CRITICAL RESULT CALLED TO, READ BACK BY AND VERIFIED WITH ROGER M. RN @ [phone] ON 07/21/2023 BY MTA    Chloride 108 98 - 111 mmol/L   CO2 21 (L) 22 - 32 mmol/L   Glucose, Bld 95 70 - 99 mg/dL    Comment: Glucose reference range applies only to samples taken after fasting for at least 8 hours.   BUN <5 (L) 8 - 23 mg/dL   Creatinine, Ser 7.84 0.61 - 1.24 mg/dL   Calcium 7.3 (L) 8.9 - 10.3 mg/dL   Total Protein 6.2 (L) 6.5 - 8.1 g/dL   Albumin 2.6 (L) 3.5 - 5.0 g/dL   AST 26 15 - 41 U/L   ALT 22 0 - 44 U/L   Alkaline Phosphatase 26 (L) 38 - 126 U/L   Total Bilirubin 0.6 <1.2 mg/dL   GFR, Estimated >69 >62 mL/min    Comment: (NOTE) Calculated using the CKD-EPI Creatinine Equation (2021)    Anion gap 6 5 - 15    Comment: Performed at [facility], [address] [place], [place] [address]  Magnesium     Status: None   Collection Time: 07/21/23 12:54 AM  Result Value Ref Range   Magnesium 2.0 1.7 - 2.4 mg/dL     Comment: Performed at [facility], [address], [place], [place] [address]  Phosphorus     Status: Abnormal   Collection Time: 07/21/23 12:54 AM  Result Value Ref Range   Phosphorus 2.4 (L) 2.5 - 4.6 mg/dL    Comment: Performed at [facility], [address], [place], [place] [address]  Glucose, capillary     Status: None   Collection Time: 07/21/23  4:24 AM  Result Value Ref Range   Glucose-Capillary 84 70 - 99 mg/dL    Comment: Glucose reference range applies only to samples taken after fasting for at least 8 hours.  Glucose, capillary     Status: None   Collection Time: 07/21/23  7:35 AM  Result Value Ref Range   Glucose-Capillary 72 70 - 99 mg/dL    Comment: Glucose reference range applies only to samples taken after fasting for at least 8 hours.   Comment 1 Notify RN    Comment 2 Document in Chart   Glucose, capillary     Status: None   Collection Time: 07/21/23 12:04 PM  Result Value Ref Range   Glucose-Capillary 86 70 - 99 mg/dL    Comment: Glucose reference range applies only to samples taken after fasting for at least 8 hours.   Comment 1 Notify RN    Comment 2 Document in Chart     Studies/Results: US SCROTUM  Result Date: 07/20/2023 CLINICAL DATA:  Undescended testis EXAM: ULTRASOUND OF SCROTUM TECHNIQUE: Complete ultrasound examination of the testicles, epididymis, and other scrotal structures was performed. COMPARISON:  07/18/2023 FINDINGS: Right testicle Measurements: 3.9 x 2.1 x 2.6 cm. No mass or microlithiasis visualized. Left testicle Measurements: 3.5 x 2.3 x 2.1 cm. No mass or microlithiasis visualized. Right epididymis: Complex right epididymal cysts are identified, largest measuring up to 1.3 x 0.8 by 1.5 cm. Left epididymis: Complex left epididymal cysts are identified, largest measuring up to 1.0 x 1.0  x 1.2 cm. Hydrocele:  None visualized. Varicocele:  None visualized. Other: There is fluid within the right inguinal  canal, measuring 4.5 x 2.3 by 4.2 cm, corresponding to the CT finding. IMPRESSION: 1. Unremarkable appearance of the testes, normally located within the scrotum. 2. Fluid within the right inguinal canal, corresponding to prior CT finding. 3. Bilateral complex epididymal cysts of uncertain clinical significance. Electronically Signed   By: Sharlet Salina M.D.   On: 07/20/2023 23:16   DG Abd 1 View  Result Date: 07/20/2023 CLINICAL DATA:  Abdominal distension. EXAM: ABDOMEN - 1 VIEW COMPARISON:  July 16, 2023.  July 18, 2023. FINDINGS: Stable colonic dilatation is noted. No definite small bowel dilatation is noted. IMPRESSION: Stable colonic dilatation is noted suggesting ileus or obstruction. Electronically Signed   By: Lupita Raider M.D.   On: 07/20/2023 07:47    Medications: I have reviewed the patient's current medications.  Assessment: Significant diarrhea Hypokalemia Generalized deconditioning Perirectal abscess requiring I&D and drain placement and subsequent removal Right-sided colitis and rectal thickening noted on CAT scan  Plan: Will increase cholestyramine from 4 g once a day to twice a day in an attempt to slow diarrhea. Although imaging shows ileus, patient does not have nausea or vomiting and has been having profuse diarrhea. Will need to continue replacing potassium aggressively with an attempt to keep potassium above 4.  Kerin Salen, MD 07/21/2023, 12:12 PM

## 2023-07-21 NOTE — Plan of Care (Signed)
  Problem: Coping: Goal: Ability to adjust to condition or change in health will improve Outcome: Progressing   Problem: Fluid Volume: Goal: Ability to maintain a balanced intake and output will improve Outcome: Progressing   Problem: Metabolic: Goal: Ability to maintain appropriate glucose levels will improve Outcome: Progressing   Problem: Nutritional: Goal: Maintenance of adequate nutrition will improve Outcome: Progressing   Problem: Tissue Perfusion: Goal: Adequacy of tissue perfusion will improve Outcome: Progressing   Problem: Clinical Measurements: Goal: Will remain free from infection Outcome: Progressing Goal: Cardiovascular complication will be avoided Outcome: Progressing   Problem: Nutrition: Goal: Adequate nutrition will be maintained Outcome: Progressing   Problem: Elimination: Goal: Will not experience complications related to urinary retention Outcome: Progressing   Problem: Pain Managment: Goal: General experience of comfort will improve Outcome: Progressing   Problem: Safety: Goal: Ability to remain free from injury will improve Outcome: Progressing   Problem: Fluid Volume: Goal: Ability to achieve a balanced intake and output will improve Outcome: Progressing   Problem: Metabolic: Goal: Ability to maintain appropriate glucose levels will improve Outcome: Progressing

## 2023-07-22 DIAGNOSIS — I4891 Unspecified atrial fibrillation: Secondary | ICD-10-CM | POA: Diagnosis not present

## 2023-07-22 DIAGNOSIS — E876 Hypokalemia: Secondary | ICD-10-CM | POA: Diagnosis not present

## 2023-07-22 DIAGNOSIS — K529 Noninfective gastroenteritis and colitis, unspecified: Secondary | ICD-10-CM | POA: Diagnosis not present

## 2023-07-22 LAB — CBC
HCT: 26.8 % — ABNORMAL LOW (ref 39.0–52.0)
Hemoglobin: 8.7 g/dL — ABNORMAL LOW (ref 13.0–17.0)
MCH: 28.7 pg (ref 26.0–34.0)
MCHC: 32.5 g/dL (ref 30.0–36.0)
MCV: 88.4 fL (ref 80.0–100.0)
Platelets: 239 10*3/uL (ref 150–400)
RBC: 3.03 MIL/uL — ABNORMAL LOW (ref 4.22–5.81)
RDW: 15.1 % (ref 11.5–15.5)
WBC: 6.1 10*3/uL (ref 4.0–10.5)
nRBC: 0 % (ref 0.0–0.2)

## 2023-07-22 LAB — GLUCOSE, CAPILLARY
Glucose-Capillary: 87 mg/dL (ref 70–99)
Glucose-Capillary: 93 mg/dL (ref 70–99)
Glucose-Capillary: 126 mg/dL — ABNORMAL HIGH (ref 70–99)
Glucose-Capillary: 95 mg/dL (ref 70–99)
Glucose-Capillary: 111 mg/dL — ABNORMAL HIGH (ref 70–99)

## 2023-07-22 LAB — BASIC METABOLIC PANEL
Anion gap: 4 — ABNORMAL LOW (ref 5–15)
BUN: <5 mg/dL — ABNORMAL LOW (ref 8–23)
CO2: 21 mmol/L — ABNORMAL LOW (ref 22–32)
Calcium: 7.4 mg/dL — ABNORMAL LOW (ref 8.9–10.3)
Chloride: 112 mmol/L — ABNORMAL HIGH (ref 98–111)
Creatinine, Ser: 1.15 mg/dL (ref 0.61–1.24)
GFR, Estimated: >60 mL/min (ref >60)
Glucose, Bld: 88 mg/dL (ref 70–99)
Potassium: 3.2 mmol/L — ABNORMAL LOW (ref 3.5–5.1)
Sodium: 137 mmol/L (ref 135–145)

## 2023-07-22 LAB — POTASSIUM: Potassium: 3.3 mmol/L — ABNORMAL LOW (ref 3.5–5.1)

## 2023-07-22 LAB — MAGNESIUM: Magnesium: 1.9 mg/dL (ref 1.7–2.4)

## 2023-07-22 MED ORDER — POTASSIUM CHLORIDE CRYS ER 20 MEQ PO TBCR: 40.0000 meq | EXTENDED_RELEASE_TABLET | ORAL | Status: DC

## 2023-07-22 MED ORDER — MAGNESIUM SULFATE 2 GM/50ML IV SOLN
2.0000 g | Freq: Once | INTRAVENOUS | Status: AC
  Administered 2023-07-22: 2 g via INTRAVENOUS
  Filled 2023-07-22 (×2): qty 50

## 2023-07-22 MED ORDER — POTASSIUM CHLORIDE CRYS ER 20 MEQ PO TBCR
40.0000 meq | EXTENDED_RELEASE_TABLET | ORAL | Status: AC
  Administered 2023-07-22 (×2): 40 meq via ORAL
  Filled 2023-07-22 (×2): qty 2

## 2023-07-22 MED ORDER — GERHARDT'S BUTT CREAM
TOPICAL_CREAM | Freq: Two times a day (BID) | CUTANEOUS | Status: DC
  Administered 2023-07-23: 1 via TOPICAL
  Administered 2023-07-24: 2 via TOPICAL
  Filled 2023-07-22: qty 1

## 2023-07-22 MED ORDER — CHOLESTYRAMINE LIGHT 4 G PO PACK
4.0000 g | PACK | Freq: Four times a day (QID) | ORAL | Status: DC
  Administered 2023-07-22 – 2023-08-02 (×36): 4 g via ORAL
  Filled 2023-07-22 (×39): qty 1

## 2023-07-22 NOTE — Progress Notes (Signed)
Pharmacy Antibiotic and Anticoagulation Note  Eric Zamora is a 77 y.o. male who is known to pharmacy from current lovenox consult. Pharmacy has been consulted on 07/16/23 to dose fluconazole for Oropharangeal candidiasis & consulted 11/17 to resume zosyn for IAI infection.  07/22/2023 D#7 po fluconazole & Po nystatin for oral thrush D#5 zosyn for colitis   11/20 rectal tube OP + 1350 mls, questran increased to qid  AF, WBC WNL, SCr 1.15   Plan: - continues on fluconazole  200 mg PO daily  - continues Zosyn 3.375 gm IV q8h, infuse each dose over 4 hours- ? DC zosyn per GI recs  He continues on LMWH for afib.  Continue current dose of 80mg  q12h  for now.  CBC stable, no bleeding reported To transition back to Eliquis once colitis resolved ___________________________________________  Height: 5\' 5"  (165.1 cm) Weight: 81.8 kg (180 lb 5.4 oz) IBW/kg (Calculated) : 61.5  Temp (24hrs), Avg:98.3 F (36.8 C), Min:97.4 F (36.3 C), Max:98.9 F (37.2 C)  Recent Labs  Lab 07/18/23 0530 07/19/23 0514 07/20/23 0210 07/20/23 1105 07/20/23 2113 07/21/23 0054 07/22/23 0415  WBC 4.9 5.0 5.5  --   --  5.7 6.1  CREATININE 1.10 1.08 1.09 1.09 1.08 1.07 1.15    Estimated Creatinine Clearance: 52.1 mL/min (by C-G formula based on SCr of 1.15 mg/dL).    No Known Allergies  Antimicrobials this admission: 10/15 Cefepime >> 10/21 10/15 Flagyl >> 10/21  10/15 Vancomycin >> 10/21 10/21 Doxy >> 11/3 10/21 Augmentin >> 11/3 11/1 Fluconazole >> 11/7, resumed 11/15 fluconazole >>  11/15 po nystatin>>  11/17 Zosyn >>    Microbiology results: No new cultures in past 30 days 10/15 BCx: ngF 10/15 MRSA PCR: positive 10/16 resp panel neg 10/17 BCx2: NGF  11/18 GI panel: neg 11/18 C diff: neg    Thank you for allowing pharmacy to be a part of this patient's care.   Herby Abraham, Pharm.D Use secure chat for questions 07/22/2023 2:30 PM

## 2023-07-22 NOTE — Plan of Care (Signed)
  Problem: Education: Goal: Ability to describe self-care measures that may prevent or decrease complications (Diabetes Survival Skills Education) will improve Outcome: Progressing   Problem: Coping: Goal: Ability to adjust to condition or change in health will improve Outcome: Progressing   Problem: Health Behavior/Discharge Planning: Goal: Ability to manage health-related needs will improve Outcome: Progressing   Problem: Nutritional: Goal: Maintenance of adequate nutrition will improve Outcome: Progressing   Problem: Health Behavior/Discharge Planning: Goal: Ability to manage health-related needs will improve Outcome: Progressing   Problem: Activity: Goal: Risk for activity intolerance will decrease Outcome: Progressing   Problem: Nutrition: Goal: Adequate nutrition will be maintained Outcome: Progressing   Problem: Coping: Goal: Level of anxiety will decrease Outcome: Progressing   Problem: Pain Managment: Goal: General experience of comfort will improve Outcome: Progressing   Problem: Safety: Goal: Ability to remain free from injury will improve Outcome: Progressing   Problem: Skin Integrity: Goal: Risk for impaired skin integrity will decrease Outcome: Progressing   Problem: Nutritional: Goal: Maintenance of adequate nutrition will improve Outcome: Progressing   Problem: Urinary Elimination: Goal: Ability to achieve and maintain adequate renal perfusion and functioning will improve Outcome: Progressing   Problem: Education: Goal: Knowledge of disease or condition will improve Outcome: Progressing   Problem: Self-Care: Goal: Ability to participate in self-care as condition permits will improve Outcome: Progressing   Problem: Nutrition: Goal: Risk of aspiration will decrease Outcome: Progressing

## 2023-07-22 NOTE — Progress Notes (Addendum)
[facility] PROGRESS NOTE   Eric Zamora YQM:[id number] DOB: 01-Dec-1945 DOA: 06/15/2023  PCP: Uvaldo Bristle, PA-C  Brief History: 77 y.o. 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, CAD, dCHF, HTN, hx DVT no longer on [facility], and HLD who admitted on 10/15 for severe sepsis due to perirectal abscess.   10/15 admitted to stepdown unit. 10/17: Overnight with fever, aspiration event, new AG acidosis, transferred back to SDU for BiPAP and insulin drip 10/18: taken to OR for drainage of abscess 10/23 cardiology was consulted for new onset A-fib with RVR. 10/24 nephrology was consulted for AKI and hyponatremia.  CT head was obtained for ongoing AMS and was found to have acute/subacute right MCA territory infarct.  Neurology was also consulted. 10/25.  Palliative care was consulted. 10/31.  Anticoagulation started again. 11/1.  Second dose of Bicillin given.  Underwent MBS. 11/2.  Marinol started for poor p.o. intake. 11/4.  Found to have ileus.  GI consulted.  Recommended DNR to legal guardian. 11/5.  Two-physician DNR form was signed and sent to guardian.  Will await decision. Subsequently developed colitis.  Significant diarrhea noted.  GI was consulted.  C. difficile and GI pathogen panel testing were negative.  Consultants: Gastroenterology.  General surgery.  Procedures: Drainage of rectal abscess    Subjective/Interval History: No complaints offered.  Specifically no abdominal pain nausea or vomiting.  Not very communicative.  Assessment/Plan:  Perirectal abscess with severe sepsis, present on admission Sepsis physiology improved.   Patient was seen by general surgery and underwent drainage of the abscess on 06/18/2023. Patient was on broad-spectrum antibiotics initially which was transitioned to cefepime and then to Augmentin and doxycycline.  2-week course was recommended from initial I&D.   Antibiotic course was  completed but now back on Zosyn due to new colitis.  Colitis/concern for ileus Gastroenterology is following.  Patient currently on Zosyn.  Has profuse diarrhea.  Stool studies negative so far. Patient started on cholestyramine.  Continues to have significant output. Does not appear to have ileus based on profuse bowel movements and abdominal exam. Continues to have high output.  Cholestyramine dose was increased by GI yesterday.  New onset atrial fibrillation with RVR Seen by cardiology.  On metoprolol.  Anticoagulation was with Eliquis but currently on Lovenox due to concern for ileus. May resume apixaban once his GI issues have stabilized.  Acute metabolic encephalopathy/history of dementia/acute right MCA stroke/oropharyngeal dysphagia CT head on 06/20/2023 showed acute versus subacute stroke.  MRI brain showed acute stroke.  MRA did not show any acute abnormalities.  Carotid Dopplers did not show any significant stenosis.  EEG did not show any seizures.  Seen by neurology.  They recommended resumption of anticoagulation. Seen by speech therapy as well.  Currently on dysphagia 1 diet with nectar thick liquids.  History of seizures EEG negative for seizure activity Noted to be on Keppra.    Multiple electrolyte abnormalities including severe hypokalemia Secondary to GI loss.   Electrolytes being aggressively repleted.  Magnesium 1.9 today.  Will order supplement.  Potassium supplementation ordered as well.  Phosphorus supplementation was given earlier this morning.    Normocytic anemia Stable hemoglobin for the most part.  Acute kidney injury on chronic kidney disease stage IIIb/none anion gap metabolic acidosis Renal function has recovered.  Monitor urine output.  Avoid nephrotoxic agents.  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 Previous rounding physician discussed with ID  and apparently patient is being treated with Bicillin on a weekly basis.  Fluid in the  right inguinal canal This was noted on recent CT scan as well as scrotal ultrasound.  Significance of this is unclear.  Would recommend that the [place] be repeated in 1 to 2 months.  History of coronary artery disease Stable.  Bipolar disorder Stable.  Diabetes mellitus type 2, uncontrolled with hyperglycemia On SSI.  Monitor CBGs.  Oral thrush On Diflucan.  Goals of care Patient has a legal guardian.  Discussions were held with legal guardian regarding his poor prognosis.  Palliative care was also consulted.  Patient transitioned to DNR.  DVT Prophylaxis: On Lovenox Code Status: DNR Family Communication: No family at bedside Disposition Plan: SNF when medically stable     Medications: Scheduled:  Chlorhexidine Gluconate Cloth  6 each Topical Daily   cholestyramine light  4 g Oral BID   enoxaparin (LOVENOX) injection  80 mg Subcutaneous Q12H   fluconazole  200 mg Oral Daily   insulin glargine-yfgn  8 Units Subcutaneous QHS   levETIRAcetam  500 mg Oral BID   magic mouthwash  10 mL Oral QID   metoprolol tartrate  25 mg Oral BID   nystatin  5 mL Oral QID   mouth rinse  15 mL Mouth Rinse 4 times per day   pantoprazole  40 mg Oral Daily   PARoxetine  10 mg Oral Daily   potassium chloride  40 mEq Oral Q4H   simethicone  40 mg Oral QID   simvastatin  20 mg Oral QHS   sodium bicarbonate  650 mg Oral BID   sodium chloride flush  3 mL Intravenous Q12H   Continuous:  piperacillin-tazobactam (ZOSYN)  IV 3.375 g (07/22/23 0247)   potassium PHOSPHATE IVPB (in mmol) 30 mmol (07/22/23 0524)   ZOX:[id number], albuterol, HYDROmorphone (DILAUDID) injection, iohexol, ondansetron **OR** ondansetron (ZOFRAN) IV, mouth rinse, sodium chloride flush  Antibiotics: Anti-infectives (From admission, onward)    Start     Dose/Rate Route Frequency Ordered Stop   07/20/23 1000  fluconazole (DIFLUCAN) tablet 200 mg        200 mg Oral Daily 07/19/23 1145     07/19/23 1300  fluconazole  (DIFLUCAN) tablet 100 mg        100 mg Oral  Once 07/19/23 1145 07/19/23 1303   07/18/23 1830  piperacillin-tazobactam (ZOSYN) IVPB 3.375 g        3.375 g 12.5 mL/hr over 240 Minutes Intravenous Every 8 hours 07/18/23 1808     07/16/23 1300  fluconazole (DIFLUCAN) tablet 100 mg  Status:  Discontinued        100 mg Oral Daily 07/16/23 1154 07/19/23 1145   07/05/23 1600  fluconazole (DIFLUCAN) IVPB 200 mg        200 mg 100 mL/hr over 60 Minutes Intravenous Every 24 hours 07/05/23 1320 07/08/23 1658   07/02/23 1000  fluconazole (DIFLUCAN) tablet 100 mg  Status:  Discontinued        100 mg Oral Daily 07/02/23 0802 07/05/23 1320   07/02/23 0800  penicillin g benzathine (BICILLIN LA) 1200000 UNIT/2ML injection 2.4 Million Units        2.4 Million Units Intramuscular Weekly 07/01/23 1209 07/09/23 0950   06/26/23 1500  penicillin g benzathine (BICILLIN LA) 1200000 UNIT/2ML injection 2.4 Million Units        2.4 Million Units Intramuscular  Once 06/26/23 1351 06/28/23 0018   06/24/23 2200  amoxicillin-clavulanate (AUGMENTIN) 500-125 MG per tablet 1  tablet  Status:  Discontinued        1 tablet Oral Every 12 hours 06/24/23 1450 07/03/23 1202   06/24/23 2200  amoxicillin-clavulanate (AUGMENTIN) 500-125 MG per tablet 1 tablet  Status:  Discontinued        1 tablet Oral 2 times daily 06/24/23 1451 06/24/23 1451   06/21/23 1215  doxycycline (VIBRA-TABS) tablet 100 mg  Status:  Discontinued        100 mg Oral Every 12 hours 06/21/23 1128 07/03/23 1202   06/21/23 1215  amoxicillin-clavulanate (AUGMENTIN) 875-125 MG per tablet 1 tablet  Status:  Discontinued        1 tablet Oral Every 12 hours 06/21/23 1128 06/24/23 1450   06/20/23 1200  vancomycin (VANCOREADY) IVPB 1500 mg/300 mL  Status:  Discontinued        1,500 mg 150 mL/hr over 120 Minutes Intravenous Every 36 hours 06/20/23 0719 06/21/23 1128   06/19/23 0000  vancomycin (VANCOREADY) IVPB 1250 mg/250 mL  Status:  Discontinued        1,250 mg 166.7  mL/hr over 90 Minutes Intravenous Every 36 hours 06/18/23 0939 06/20/23 0719   06/18/23 1800  ceFEPIme (MAXIPIME) 2 g in sodium chloride 0.9 % 100 mL IVPB  Status:  Discontinued        2 g 200 mL/hr over 30 Minutes Intravenous Every 12 hours 06/18/23 0939 06/21/23 1128   06/18/23 0600  ceFEPIme (MAXIPIME) 2 g in sodium chloride 0.9 % 100 mL IVPB  Status:  Discontinued        2 g 200 mL/hr over 30 Minutes Intravenous Every 24 hours 06/17/23 0920 06/18/23 0939   06/17/23 1400  vancomycin (VANCOCIN) IVPB 1000 mg/200 mL premix  Status:  Discontinued        1,000 mg 200 mL/hr over 60 Minutes Intravenous Every 36 hours 06/17/23 0920 06/18/23 0939   06/16/23 1800  vancomycin (VANCOREADY) IVPB 750 mg/150 mL  Status:  Discontinued        750 mg 150 mL/hr over 60 Minutes Intravenous Every 24 hours 06/15/23 1639 06/17/23 0920   06/16/23 1100  cefTRIAXone (ROCEPHIN) 2 g in sodium chloride 0.9 % 100 mL IVPB  Status:  Discontinued        2 g 200 mL/hr over 30 Minutes Intravenous Every 24 hours 06/15/23 1453 06/15/23 1455   06/15/23 1800  ceFEPIme (MAXIPIME) 2 g in sodium chloride 0.9 % 100 mL IVPB  Status:  Discontinued        2 g 200 mL/hr over 30 Minutes Intravenous Every 12 hours 06/15/23 1558 06/17/23 0920   06/15/23 1700  metroNIDAZOLE (FLAGYL) IVPB 500 mg  Status:  Discontinued        500 mg 100 mL/hr over 60 Minutes Intravenous Every 12 hours 06/15/23 1455 06/21/23 1128   06/15/23 1630  vancomycin (VANCOREADY) IVPB 1500 mg/300 mL        1,500 mg 150 mL/hr over 120 Minutes Intravenous  Once 06/15/23 1558 06/15/23 2058   06/15/23 1045  cefTRIAXone (ROCEPHIN) 2 g in sodium chloride 0.9 % 100 mL IVPB        2 g 200 mL/hr over 30 Minutes Intravenous  Once 06/15/23 1041 06/15/23 1155       Objective:  Vital Signs  Vitals:   07/21/23 1207 07/21/23 1955 07/22/23 0500 07/22/23 0504  BP: 126/80 121/74  128/75  Pulse: 78 92  72  Resp:  18  18  Temp: 99.2 F (37.3 C) (!) 97.4 F (36.3 C)  98.9 F (37.2 C)  TempSrc:      SpO2: 100% 94%  100%  Weight:   81.8 kg   Height:        Intake/Output Summary (Last 24 hours) at 07/22/2023 0852 Last data filed at 07/22/2023 0251 Gross per 24 hour  Intake 767.5 ml  Output 2350 ml  Net -1582.5 ml   Filed Weights   07/20/23 0500 07/21/23 0419 07/22/23 0500  Weight: 80.8 kg 77.5 kg 81.8 kg    General appearance: Awake alert.  In no distress.  Distracted Resp: Clear to auscultation bilaterally.  Normal effort Cardio: S1-S2 is normal regular.  No S3-S4.  No rubs murmurs or bruit GI: Abdomen is soft.  Nontender nondistended.  Bowel sounds are present normal.  No masses organomegaly Extremities: No edema.  Physical deconditioning noted.   Lab Results:  Data Reviewed: I have personally reviewed following labs and reports of the imaging studies  CBC: Recent Labs  Lab 07/17/23 0215 07/18/23 0530 07/19/23 0514 07/20/23 0210 07/21/23 0054 07/22/23 0415  WBC 5.4 4.9 5.0 5.5 5.7 6.1  NEUTROABS 3.1 2.9 3.1 3.0 3.0  --   HGB 8.9* 9.1* 8.9* 8.7* 8.7* 8.7*  HCT 27.8* 28.6* 27.8* 26.6* 26.7* 26.8*  MCV 90.3 90.8 88.0 88.7 88.4 88.4  PLT 197 208 227 218 276 239    Basic Metabolic Panel: Recent Labs  Lab 07/19/23 0514 07/20/23 0210 07/20/23 1105 07/20/23 2113 07/21/23 0054 07/21/23 2008 07/22/23 0415  NA 139 137 138 138 135  --  137  K 2.1* 3.1* 5.3* 2.7* 2.6* 2.6* 3.2*  CL 110 111 112* 109 108  --  112*  CO2 21* 19* 20* 20* 21*  --  21*  GLUCOSE 60* 92 88 114* 95  --  88  BUN <5* <5* <5* <5* <5*  --  <5*  CREATININE 1.08 1.09 1.09 1.08 1.07  --  1.15  CALCIUM 7.3* 7.1* 7.2* 7.5* 7.3*  --  7.4*  MG 1.9 2.0  --  2.0 2.0  --  1.9  PHOS 2.0* 1.9* 2.1* 2.7 2.4* 1.7*  --     GFR: Estimated Creatinine Clearance: 52.1 mL/min (by C-G formula based on SCr of 1.15 mg/dL).  Liver Function Tests: Recent Labs  Lab 07/17/23 0215 07/18/23 0530 07/19/23 0514 07/20/23 0210 07/21/23 0054  AST 33 34 33 29 26  ALT 26 27 27  24 22   ALKPHOS 27* 30* 25* 25* 26*  BILITOT 0.5 0.5 0.7 0.5 0.6  PROT 5.9* 6.0* 6.0* 5.9* 6.2*  ALBUMIN 2.6* 2.6* 2.6* 2.5* 2.6*     CBG: Recent Labs  Lab 07/21/23 1656 07/21/23 2030 07/21/23 2354 07/22/23 0356 07/22/23 0733  GLUCAP 80 94 85 87 93     Recent Results (from the past 240 hour(s))  Gastrointestinal Panel by PCR , Stool     Status: None   Collection Time: 07/19/23 11:40 AM   Specimen: STOOL  Result Value Ref Range Status   Campylobacter species NOT DETECTED NOT DETECTED Final   Plesimonas shigelloides NOT DETECTED NOT DETECTED Final   Salmonella species NOT DETECTED NOT DETECTED Final   Yersinia enterocolitica NOT DETECTED NOT DETECTED Final   Vibrio species NOT DETECTED NOT DETECTED Final   Vibrio cholerae NOT DETECTED NOT DETECTED Final   Enteroaggregative E coli (EAEC) NOT DETECTED NOT DETECTED Final   Enteropathogenic E coli (EPEC) NOT DETECTED NOT DETECTED Final   Enterotoxigenic E coli (ETEC) NOT DETECTED NOT DETECTED Final   Shiga like toxin  producing E coli (STEC) NOT DETECTED NOT DETECTED Final   Shigella/Enteroinvasive E coli (EIEC) NOT DETECTED NOT DETECTED Final   Cryptosporidium NOT DETECTED NOT DETECTED Final   Cyclospora cayetanensis NOT DETECTED NOT DETECTED Final   Entamoeba histolytica NOT DETECTED NOT DETECTED Final   Giardia lamblia NOT DETECTED NOT DETECTED Final   Adenovirus F40/41 NOT DETECTED NOT DETECTED Final   Astrovirus NOT DETECTED NOT DETECTED Final   Norovirus GI/GII NOT DETECTED NOT DETECTED Final   Rotavirus A NOT DETECTED NOT DETECTED Final   Sapovirus (I, II, IV, and V) NOT DETECTED NOT DETECTED Final    Comment: Performed at [facility], [address], [place], [place] [address]  C Difficile Quick Screen (NO PCR Reflex)     Status: None   Collection Time: 07/19/23 11:40 AM   Specimen: STOOL  Result Value Ref Range Status   C Diff antigen NEGATIVE NEGATIVE Final   C Diff toxin NEGATIVE NEGATIVE Final   C  Diff interpretation No C. difficile detected.  Final    Comment: Performed at [facility], [address] [place], [place] [address]      Radiology Studies: DG Abd 1 View  Result Date: 07/21/2023 CLINICAL DATA:  Abdominal distension EXAM: ABDOMEN - 1 VIEW COMPARISON:  07/20/2023 FINDINGS: Continued dilation of the transverse colon and sigmoid colon, not substantially changed from 07/20/2023. No dilated small bowel observed. Mild lower lumbar spondylosis. IMPRESSION: 1. Continued dilation of the transverse colon and sigmoid colon, not substantially changed from 07/20/2023. 2. Mild lower lumbar spondylosis. Electronically Signed   By: Gaylyn Rong M.D.   On: 07/21/2023 11:59   US SCROTUM  Result Date: 07/20/2023 CLINICAL DATA:  Undescended testis EXAM: ULTRASOUND OF SCROTUM TECHNIQUE: Complete ultrasound examination of the testicles, epididymis, and other scrotal structures was performed. COMPARISON:  07/18/2023 FINDINGS: Right testicle Measurements: 3.9 x 2.1 x 2.6 cm. No mass or microlithiasis visualized. Left testicle Measurements: 3.5 x 2.3 x 2.1 cm. No mass or microlithiasis visualized. Right epididymis: Complex right epididymal cysts are identified, largest measuring up to 1.3 x 0.8 by 1.5 cm. Left epididymis: Complex left epididymal cysts are identified, largest measuring up to 1.0 x 1.0 x 1.2 cm. Hydrocele:  None visualized. Varicocele:  None visualized. Other: There is fluid within the right inguinal canal, measuring 4.5 x 2.3 by 4.2 cm, corresponding to the CT finding. IMPRESSION: 1. Unremarkable appearance of the testes, normally located within the scrotum. 2. Fluid within the right inguinal canal, corresponding to prior CT finding. 3. Bilateral complex epididymal cysts of uncertain clinical significance. Electronically Signed   By: Sharlet Salina M.D.   On: 07/20/2023 23:16       LOS: 37 days   [employer]us [employer] Pager on  [email or web address]  07/22/2023, 8:52 AM

## 2023-07-22 NOTE — Consult Note (Addendum)
WOC Nurse Consult Note: Reason for Consult: stage 2 buttocks  Wound type: Stage 2 Pressure Injuries B buttocks  Pressure Injury POA: no  Measurement: R buttock 6 cm x 4 cm x 0.1 cm; L buttock 4 cm x 2 cm x 0.1 cm  Wound bed: 100% pink and moist  Drainage (amount, consistency, odor) minimal serosanguinous  Periwound: minimal MASD noted to lower buttocks (patient has flexiseal in at time of visit)  Dressing procedure/placement/frequency: Clean buttocks wounds with Vashe wound cleanser Hart [place] [phone]), apply a single layer of Xeroform to wound beds daily Hart [place] [phone]). Apply Gerhardt's Butt Cream to surrounding intact skin of buttocks. Cover with silicone foam or ABD pad whichever is preferred.   POC discussed with bedside nurse. WOC team will not follow. Re-consult if further needs arise.   Thank you,    Priscella Mann MSN, RN-BC, [employer] [phone]

## 2023-07-22 NOTE — Progress Notes (Signed)
Subjective: Patient denies abdominal pain. Flexi-Seal draining liquid watery stool.  Objective: Vital signs in last 24 hours: Temp:  [97.4 F (36.3 C)-99.2 F (37.3 C)] 98.9 F (37.2 C) (11/21 0504) Pulse Rate:  [72-92] 72 (11/21 0504) Resp:  [18] 18 (11/21 0504) BP: (121-128)/(74-80) 128/75 (11/21 0504) SpO2:  [94 %-100 %] 100 % (11/21 0504) Weight:  [81.8 kg] 81.8 kg (11/21 0500) Weight change: 4.3 kg Last BM Date : 07/21/23  PE: Deconditioned, lying on bed, minimally verbal GENERAL: Ill-appearing  ABDOMEN: Distended but soft, bowel sounds audible EXTREMITIES: No deformity  Lab Results: Results for orders placed or performed during the hospital encounter of 06/15/23 (from the past 48 hour(s))  Glucose, capillary     Status: None   Collection Time: 07/20/23 11:58 AM  Result Value Ref Range   Glucose-Capillary 86 70 - 99 mg/dL    Comment: Glucose reference range applies only to samples taken after fasting for at least 8 hours.  Glucose, capillary     Status: None   Collection Time: 07/20/23  4:58 PM  Result Value Ref Range   Glucose-Capillary 88 70 - 99 mg/dL    Comment: Glucose reference range applies only to samples taken after fasting for at least 8 hours.  Glucose, capillary     Status: Abnormal   Collection Time: 07/20/23  8:54 PM  Result Value Ref Range   Glucose-Capillary 106 (H) 70 - 99 mg/dL    Comment: Glucose reference range applies only to samples taken after fasting for at least 8 hours.  Basic metabolic panel     Status: Abnormal   Collection Time: 07/20/23  9:13 PM  Result Value Ref Range   Sodium 138 135 - 145 mmol/L   Potassium 2.7 (LL) 3.5 - 5.1 mmol/L    Comment: CRITICAL RESULT CALLED TO, READ BACK BY AND VERIFIED WITH ROGER M. RN @ 2214 ON 07/20/2023 BY MTA DELTA CHECK NOTED    Chloride 109 98 - 111 mmol/L   CO2 20 (L) 22 - 32 mmol/L   Glucose, Bld 114 (H) 70 - 99 mg/dL    Comment: Glucose reference range applies only to samples taken after  fasting for at least 8 hours.   BUN <5 (L) 8 - 23 mg/dL   Creatinine, Ser 1.61 0.61 - 1.24 mg/dL   Calcium 7.5 (L) 8.9 - 10.3 mg/dL   GFR, Estimated >09 >60 mL/min    Comment: (NOTE) Calculated using the CKD-EPI Creatinine Equation (2021)    Anion gap 9 5 - 15    Comment: Performed at [facility], [address] [place], [place] [address]  Magnesium     Status: None   Collection Time: 07/20/23  9:13 PM  Result Value Ref Range   Magnesium 2.0 1.7 - 2.4 mg/dL    Comment: Performed at [facility], [address] [place], [place] [address]  Phosphorus     Status: None   Collection Time: 07/20/23  9:13 PM  Result Value Ref Range   Phosphorus 2.7 2.5 - 4.6 mg/dL    Comment: Performed at [facility], [address] [place], [place] [address]  Glucose, capillary     Status: Abnormal   Collection Time: 07/21/23 12:08 AM  Result Value Ref Range   Glucose-Capillary 100 (H) 70 - 99 mg/dL    Comment: Glucose reference range applies only to samples taken after fasting for at least 8 hours.  CBC with Differential/Platelet  Status: Abnormal   Collection Time: 07/21/23 12:54 AM  Result Value Ref Range   WBC 5.7 4.0 - 10.5 K/uL   RBC 3.02 (L) 4.22 - 5.81 MIL/uL   Hemoglobin 8.7 (L) 13.0 - 17.0 g/dL   HCT 54.2 (L) 70.6 - 23.7 %   MCV 88.4 80.0 - 100.0 fL   MCH 28.8 26.0 - 34.0 pg   MCHC 32.6 30.0 - 36.0 g/dL   RDW 62.8 31.5 - 17.6 %   Platelets 276 150 - 400 K/uL   nRBC 0.0 0.0 - 0.2 %   Neutrophils Relative % 53 %   Neutro Abs 3.0 1.7 - 7.7 K/uL   Lymphocytes Relative 28 %   Lymphs Abs 1.6 0.7 - 4.0 K/uL   Monocytes Relative 10 %   Monocytes Absolute 0.6 0.1 - 1.0 K/uL   Eosinophils Relative 8 %   Eosinophils Absolute 0.5 0.0 - 0.5 K/uL   Basophils Relative 1 %   Basophils Absolute 0.1 0.0 - 0.1 K/uL   Immature Granulocytes 0 %   Abs Immature Granulocytes 0.02 0.00 - 0.07 K/uL    Comment: Performed at [facility], [address], [place], [place] [address]  Comprehensive metabolic panel     Status: Abnormal   Collection Time: 07/21/23 12:54 AM  Result Value Ref Range   Sodium 135 135 - 145 mmol/L   Potassium 2.6 (LL) 3.5 - 5.1 mmol/L    Comment: CRITICAL RESULT CALLED TO, READ BACK BY AND VERIFIED WITH ROGER M. RN @ [phone] ON 07/21/2023 BY MTA    Chloride 108 98 - 111 mmol/L   CO2 21 (L) 22 - 32 mmol/L   Glucose, Bld 95 70 - 99 mg/dL    Comment: Glucose reference range applies only to samples taken after fasting for at least 8 hours.   BUN <5 (L) 8 - 23 mg/dL   Creatinine, Ser 2.69 0.61 - 1.24 mg/dL   Calcium 7.3 (L) 8.9 - 10.3 mg/dL   Total Protein 6.2 (L) 6.5 - 8.1 g/dL   Albumin 2.6 (L) 3.5 - 5.0 g/dL   AST 26 15 - 41 U/L   ALT 22 0 - 44 U/L   Alkaline Phosphatase 26 (L) 38 - 126 U/L   Total Bilirubin 0.6 <1.2 mg/dL   GFR, Estimated >48 >54 mL/min    Comment: (NOTE) Calculated using the CKD-EPI Creatinine Equation (2021)    Anion gap 6 5 - 15    Comment: Performed at [facility], [address], [place], [place] [address]  Magnesium     Status: None   Collection Time: 07/21/23 12:54 AM  Result Value Ref Range   Magnesium 2.0 1.7 - 2.4 mg/dL    Comment: Performed at [facility], [address] [place], [place] [address]  Phosphorus     Status: Abnormal   Collection Time: 07/21/23 12:54 AM  Result Value Ref Range   Phosphorus 2.4 (L) 2.5 - 4.6 mg/dL    Comment: Performed at [facility], [address] [place], [place] [address]  Glucose, capillary     Status: None   Collection Time: 07/21/23  4:24 AM  Result Value Ref Range   Glucose-Capillary 84 70 - 99 mg/dL    Comment: Glucose reference range applies only to samples taken after fasting for at least 8 hours.  Glucose, capillary     Status: None   Collection Time: 07/21/23  7:35 AM  Result Value Ref  Range   Glucose-Capillary 72 70 - 99 mg/dL     Comment: Glucose reference range applies only to samples taken after fasting for at least 8 hours.   Comment 1 Notify RN    Comment 2 Document in Chart   Glucose, capillary     Status: None   Collection Time: 07/21/23 12:04 PM  Result Value Ref Range   Glucose-Capillary 86 70 - 99 mg/dL    Comment: Glucose reference range applies only to samples taken after fasting for at least 8 hours.   Comment 1 Notify RN    Comment 2 Document in Chart   Glucose, capillary     Status: None   Collection Time: 07/21/23  4:56 PM  Result Value Ref Range   Glucose-Capillary 80 70 - 99 mg/dL    Comment: Glucose reference range applies only to samples taken after fasting for at least 8 hours.   Comment 1 Notify RN    Comment 2 Document in Chart   Potassium     Status: Abnormal   Collection Time: 07/21/23  8:08 PM  Result Value Ref Range   Potassium 2.6 (LL) 3.5 - 5.1 mmol/L    Comment: CRITICAL RESULT CALLED TO, READ BACK BY AND VERIFIED WITH Margret Chance, RN 07/21/23 2053 BY K .DAVIS Performed at [facility], [address], [place], [place] [address]   Phosphorus     Status: Abnormal   Collection Time: 07/21/23  8:08 PM  Result Value Ref Range   Phosphorus 1.7 (L) 2.5 - 4.6 mg/dL    Comment: Performed at [facility], [address], [place], [place] [address]  Glucose, capillary     Status: None   Collection Time: 07/21/23  8:30 PM  Result Value Ref Range   Glucose-Capillary 94 70 - 99 mg/dL    Comment: Glucose reference range applies only to samples taken after fasting for at least 8 hours.  Glucose, capillary     Status: None   Collection Time: 07/21/23 11:54 PM  Result Value Ref Range   Glucose-Capillary 85 70 - 99 mg/dL    Comment: Glucose reference range applies only to samples taken after fasting for at least 8 hours.  Glucose, capillary     Status: None   Collection Time: 07/22/23  3:56 AM  Result Value Ref Range   Glucose-Capillary 87 70 - 99  mg/dL    Comment: Glucose reference range applies only to samples taken after fasting for at least 8 hours.  CBC     Status: Abnormal   Collection Time: 07/22/23  4:15 AM  Result Value Ref Range   WBC 6.1 4.0 - 10.5 K/uL   RBC 3.03 (L) 4.22 - 5.81 MIL/uL   Hemoglobin 8.7 (L) 13.0 - 17.0 g/dL   HCT 95.2 (L) 84.1 - 32.4 %   MCV 88.4 80.0 - 100.0 fL   MCH 28.7 26.0 - 34.0 pg   MCHC 32.5 30.0 - 36.0 g/dL   RDW 40.1 02.7 - 25.3 %   Platelets 239 150 - 400 K/uL   nRBC 0.0 0.0 - 0.2 %    Comment: Performed at [facility], [address] [place], [place] [address]  Basic metabolic panel     Status: Abnormal   Collection Time: 07/22/23  4:15 AM  Result Value Ref Range   Sodium 137 135 - 145 mmol/L   Potassium 3.2 (L) 3.5 - 5.1 mmol/L   Chloride 112 (H) 98 - 111  mmol/L   CO2 21 (L) 22 - 32 mmol/L   Glucose, Bld 88 70 - 99 mg/dL    Comment: Glucose reference range applies only to samples taken after fasting for at least 8 hours.   BUN <5 (L) 8 - 23 mg/dL   Creatinine, Ser 1.61 0.61 - 1.24 mg/dL   Calcium 7.4 (L) 8.9 - 10.3 mg/dL   GFR, Estimated >09 >60 mL/min    Comment: (NOTE) Calculated using the CKD-EPI Creatinine Equation (2021)    Anion gap 4 (L) 5 - 15    Comment: Performed at [facility], [address] [place], [place] [address]  Magnesium     Status: None   Collection Time: 07/22/23  4:15 AM  Result Value Ref Range   Magnesium 1.9 1.7 - 2.4 mg/dL    Comment: Performed at [facility], [address] [place], [place] [address]  Glucose, capillary     Status: None   Collection Time: 07/22/23  7:33 AM  Result Value Ref Range   Glucose-Capillary 93 70 - 99 mg/dL    Comment: Glucose reference range applies only to samples taken after fasting for at least 8 hours.    Studies/Results: DG Abd 1 View  Result Date: 07/21/2023 CLINICAL DATA:  Abdominal distension EXAM: ABDOMEN - 1 VIEW COMPARISON:  07/20/2023  FINDINGS: Continued dilation of the transverse colon and sigmoid colon, not substantially changed from 07/20/2023. No dilated small bowel observed. Mild lower lumbar spondylosis. IMPRESSION: 1. Continued dilation of the transverse colon and sigmoid colon, not substantially changed from 07/20/2023. 2. Mild lower lumbar spondylosis. Electronically Signed   By: Gaylyn Rong M.D.   On: 07/21/2023 11:59   US SCROTUM  Result Date: 07/20/2023 CLINICAL DATA:  Undescended testis EXAM: ULTRASOUND OF SCROTUM TECHNIQUE: Complete ultrasound examination of the testicles, epididymis, and other scrotal structures was performed. COMPARISON:  07/18/2023 FINDINGS: Right testicle Measurements: 3.9 x 2.1 x 2.6 cm. No mass or microlithiasis visualized. Left testicle Measurements: 3.5 x 2.3 x 2.1 cm. No mass or microlithiasis visualized. Right epididymis: Complex right epididymal cysts are identified, largest measuring up to 1.3 x 0.8 by 1.5 cm. Left epididymis: Complex left epididymal cysts are identified, largest measuring up to 1.0 x 1.0 x 1.2 cm. Hydrocele:  None visualized. Varicocele:  None visualized. Other: There is fluid within the right inguinal canal, measuring 4.5 x 2.3 by 4.2 cm, corresponding to the CT finding. IMPRESSION: 1. Unremarkable appearance of the testes, normally located within the scrotum. 2. Fluid within the right inguinal canal, corresponding to prior CT finding. 3. Bilateral complex epididymal cysts of uncertain clinical significance. Electronically Signed   By: Sharlet Salina M.D.   On: 07/20/2023 23:16    Medications: I have reviewed the patient's current medications.  Assessment: Increased output on rectal tube, not improving despite increasing cholestyramine to twice a day, 1350 mL in the last 24 hours  Significant hypokalemia, slightly improved with aggressive replacement, potassium 3.2 today, continues to remain on aggressive replacement regimen.  Recent perirectal abscess with  incision and drainage, drain placement and removal Oral thrush  Plan: I have increased cholestyramine to 4 g 4 times a day. If this does not help decreasing stool output, I may have to start patient on loperamide. Although he has evidence of colitis on CAT scan this is less likely infectious, possibly ischemic, we may have to consider discontinuing IV Zosyn which may help with diarrhea. He is on nystatin suspension, Magic mouthwash and Diflucan  200 mg once a day. Guarded prognosis.  Kerin Salen, MD 07/22/2023, 11:12 AM

## 2023-07-22 NOTE — Plan of Care (Signed)
  Problem: Education: Goal: Knowledge of General Education information will improve Description: Including pain rating scale, medication(s)/side effects and non-pharmacologic comfort measures Outcome: Progressing   Problem: Activity: Goal: Risk for activity intolerance will decrease Outcome: Progressing   Problem: Coping: Goal: Level of anxiety will decrease Outcome: Progressing   Problem: Pain Managment: Goal: General experience of comfort will improve Outcome: Progressing   Problem: Safety: Goal: Ability to remain free from injury will improve Outcome: Progressing   Problem: Clinical Measurements: Goal: Diagnostic test results will improve Outcome: Progressing   Problem: Respiratory: Goal: Ability to maintain adequate ventilation will improve Outcome: Progressing   Problem: Cardiac: Goal: Ability to maintain an adequate cardiac output will improve Outcome: Progressing   Problem: Fluid Volume: Goal: Ability to achieve a balanced intake and output will improve Outcome: Progressing   Problem: Metabolic: Goal: Ability to maintain appropriate glucose levels will improve Outcome: Progressing   Problem: Nutritional: Goal: Maintenance of adequate nutrition will improve Outcome: Progressing   Problem: Education: Goal: Knowledge of disease or condition will improve Outcome: Progressing

## 2023-07-23 DIAGNOSIS — E876 Hypokalemia: Secondary | ICD-10-CM | POA: Diagnosis not present

## 2023-07-23 DIAGNOSIS — I4891 Unspecified atrial fibrillation: Secondary | ICD-10-CM | POA: Diagnosis not present

## 2023-07-23 DIAGNOSIS — K529 Noninfective gastroenteritis and colitis, unspecified: Secondary | ICD-10-CM | POA: Diagnosis not present

## 2023-07-23 LAB — MAGNESIUM: Magnesium: 2.1 mg/dL (ref 1.7–2.4)

## 2023-07-23 LAB — BASIC METABOLIC PANEL
Anion gap: 8 (ref 5–15)
BUN: <5 mg/dL — ABNORMAL LOW (ref 8–23)
CO2: 21 mmol/L — ABNORMAL LOW (ref 22–32)
Calcium: 7.5 mg/dL — ABNORMAL LOW (ref 8.9–10.3)
Chloride: 110 mmol/L (ref 98–111)
Creatinine, Ser: 1.19 mg/dL (ref 0.61–1.24)
GFR, Estimated: >60 mL/min (ref >60)
Glucose, Bld: 79 mg/dL (ref 70–99)
Potassium: 2.9 mmol/L — ABNORMAL LOW (ref 3.5–5.1)
Sodium: 139 mmol/L (ref 135–145)

## 2023-07-23 LAB — GLUCOSE, CAPILLARY
Glucose-Capillary: 110 mg/dL — ABNORMAL HIGH (ref 70–99)
Glucose-Capillary: 118 mg/dL — ABNORMAL HIGH (ref 70–99)
Glucose-Capillary: 76 mg/dL (ref 70–99)
Glucose-Capillary: 77 mg/dL (ref 70–99)
Glucose-Capillary: 88 mg/dL (ref 70–99)
Glucose-Capillary: 96 mg/dL (ref 70–99)

## 2023-07-23 MED ORDER — POTASSIUM CHLORIDE 10 MEQ/100ML IV SOLN
10.0000 meq | INTRAVENOUS | Status: AC
  Administered 2023-07-23 (×4): 10 meq via INTRAVENOUS
  Filled 2023-07-23 (×4): qty 100

## 2023-07-23 MED ORDER — LOPERAMIDE HCL 2 MG PO CAPS
2.0000 mg | ORAL_CAPSULE | Freq: Every day | ORAL | Status: DC
  Administered 2023-07-23 – 2023-07-27 (×5): 2 mg via ORAL
  Filled 2023-07-23 (×5): qty 1

## 2023-07-23 MED ORDER — POTASSIUM CHLORIDE CRYS ER 20 MEQ PO TBCR
40.0000 meq | EXTENDED_RELEASE_TABLET | ORAL | Status: AC
Start: 2023-07-23 — End: 2023-07-23
  Administered 2023-07-23 (×3): 40 meq via ORAL
  Filled 2023-07-23 (×3): qty 2

## 2023-07-23 NOTE — Plan of Care (Signed)
Problem: Education: Goal: Ability to describe self-care measures that may prevent or decrease complications (Diabetes Survival Skills Education) will improve Outcome: Progressing Goal: Individualized Educational Video(s) Outcome: Progressing   Problem: Coping: Goal: Ability to adjust to condition or change in health will improve Outcome: Progressing   Problem: Fluid Volume: Goal: Ability to maintain a balanced intake and output will improve Outcome: Progressing   Problem: Health Behavior/Discharge Planning: Goal: Ability to identify and utilize available resources and services will improve Outcome: Progressing Goal: Ability to manage health-related needs will improve Outcome: Progressing   Problem: Metabolic: Goal: Ability to maintain appropriate glucose levels will improve Outcome: Progressing   Problem: Nutritional: Goal: Maintenance of adequate nutrition will improve Outcome: Progressing Goal: Progress toward achieving an optimal weight will improve Outcome: Progressing   Problem: Skin Integrity: Goal: Risk for impaired skin integrity will decrease Outcome: Progressing   Problem: Tissue Perfusion: Goal: Adequacy of tissue perfusion will improve Outcome: Progressing   Problem: Education: Goal: Knowledge of General Education information will improve Description: Including pain rating scale, medication(s)/side effects and non-pharmacologic comfort measures Outcome: Progressing   Problem: Health Behavior/Discharge Planning: Goal: Ability to manage health-related needs will improve Outcome: Progressing   Problem: Clinical Measurements: Goal: Ability to maintain clinical measurements within normal limits will improve Outcome: Progressing Goal: Will remain free from infection Outcome: Progressing Goal: Diagnostic test results will improve Outcome: Progressing Goal: Respiratory complications will improve Outcome: Progressing Goal: Cardiovascular complication will  be avoided Outcome: Progressing   Problem: Activity: Goal: Risk for activity intolerance will decrease Outcome: Progressing   Problem: Nutrition: Goal: Adequate nutrition will be maintained Outcome: Progressing   Problem: Coping: Goal: Level of anxiety will decrease Outcome: Progressing   Problem: Elimination: Goal: Will not experience complications related to bowel motility Outcome: Progressing Goal: Will not experience complications related to urinary retention Outcome: Progressing   Problem: Pain Managment: Goal: General experience of comfort will improve Outcome: Progressing   Problem: Safety: Goal: Ability to remain free from injury will improve Outcome: Progressing   Problem: Skin Integrity: Goal: Risk for impaired skin integrity will decrease Outcome: Progressing   Problem: Fluid Volume: Goal: Hemodynamic stability will improve Outcome: Progressing   Problem: Clinical Measurements: Goal: Diagnostic test results will improve Outcome: Progressing Goal: Signs and symptoms of infection will decrease Outcome: Progressing   Problem: Respiratory: Goal: Ability to maintain adequate ventilation will improve Outcome: Progressing   Problem: Education: Goal: Ability to describe self-care measures that may prevent or decrease complications (Diabetes Survival Skills Education) will improve Outcome: Progressing Goal: Individualized Educational Video(s) Outcome: Progressing   Problem: Cardiac: Goal: Ability to maintain an adequate cardiac output will improve Outcome: Progressing   Problem: Health Behavior/Discharge Planning: Goal: Ability to identify and utilize available resources and services will improve Outcome: Progressing Goal: Ability to manage health-related needs will improve Outcome: Progressing   Problem: Fluid Volume: Goal: Ability to achieve a balanced intake and output will improve Outcome: Progressing   Problem: Metabolic: Goal: Ability to  maintain appropriate glucose levels will improve Outcome: Progressing   Problem: Nutritional: Goal: Maintenance of adequate nutrition will improve Outcome: Progressing Goal: Maintenance of adequate weight for body size and type will improve Outcome: Progressing   Problem: Respiratory: Goal: Will regain and/or maintain adequate ventilation Outcome: Progressing   Problem: Urinary Elimination: Goal: Ability to achieve and maintain adequate renal perfusion and functioning will improve Outcome: Progressing   Problem: Education: Goal: Knowledge of disease or condition will improve Outcome: Progressing Goal: Knowledge of secondary prevention will improve (  MUST DOCUMENT ALL) Outcome: Progressing Goal: Knowledge of patient specific risk factors will improve Loraine Leriche N/A or DELETE if not current risk factor) Outcome: Progressing   Problem: Ischemic Stroke/TIA Tissue Perfusion: Goal: Complications of ischemic stroke/TIA will be minimized Outcome: Progressing

## 2023-07-23 NOTE — Progress Notes (Signed)
[facility] PROGRESS NOTE   Eric Zamora FTD:[id number] DOB: 1946/05/02 DOA: 06/15/2023  PCP: Uvaldo Bristle, PA-C  Brief History: 77 y.o. 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, CAD, dCHF, HTN, hx DVT no longer on [facility], and HLD who admitted on 10/15 for severe sepsis due to perirectal abscess.   10/15 admitted to stepdown unit. 10/17: Overnight with fever, aspiration event, new AG acidosis, transferred back to SDU for BiPAP and insulin drip 10/18: taken to OR for drainage of abscess 10/23 cardiology was consulted for new onset A-fib with RVR. 10/24 nephrology was consulted for AKI and hyponatremia.  CT head was obtained for ongoing AMS and was found to have acute/subacute right MCA territory infarct.  Neurology was also consulted. 10/25.  Palliative care was consulted. 10/31.  Anticoagulation started again. 11/1.  Second dose of Bicillin given.  Underwent MBS. 11/2.  Marinol started for poor p.o. intake. 11/4.  Found to have ileus.  GI consulted.  Recommended DNR to legal guardian. 11/5.  Two-physician DNR form was signed and sent to guardian.  Will await decision. Subsequently developed colitis.  Significant diarrhea noted.  GI was consulted.  C. difficile and GI pathogen panel testing were negative.  Consultants: Gastroenterology.  General surgery.  Procedures: Drainage of rectal abscess    Subjective/Interval History: Patient denies any complaints.  No nausea vomiting.  No abdominal pain.  Seems to be more alert today.  Assessment/Plan:  Perirectal abscess with severe sepsis, present on admission Sepsis physiology improved.   Patient was seen by general surgery and underwent drainage of the abscess on 06/18/2023. Patient was on broad-spectrum antibiotics initially which was transitioned to cefepime and then to Augmentin and doxycycline.  2-week course was recommended from initial I&D.   Antibiotic course was  completed but now back on Zosyn due to new colitis.  Colitis Gastroenterology is following.  Patient currently on Zosyn.  Has profuse diarrhea.  Stool studies negative so far. Patient was started on cholestyramine.  Dose was increased.  Continues to have significant output.  Gastroenterology is considering initiating antimotility agents.  May also need to consider discontinuing Zosyn.  New onset atrial fibrillation with RVR Seen by cardiology.  On metoprolol.  Anticoagulation was with Eliquis but currently on Lovenox due to concern for ileus. May resume apixaban once his GI issues have stabilized.  Acute metabolic encephalopathy/history of dementia/acute right MCA stroke/oropharyngeal dysphagia CT head on 06/20/2023 showed acute versus subacute stroke.  MRI brain showed acute stroke.  MRA did not show any acute abnormalities.  Carotid Dopplers did not show any significant stenosis.  EEG did not show any seizures.  Seen by neurology.  They recommended resumption of anticoagulation. Seen by speech therapy as well.  Currently on dysphagia 1 diet with nectar thick liquids.  History of seizures EEG negative for seizure activity Noted to be on Keppra.    Multiple electrolyte abnormalities including severe hypokalemia Secondary to GI loss.  Potassium remains low due to significant GI loss.  Continue to supplement.  Magnesium is 2.1.  Normocytic anemia Stable hemoglobin for the most part.  Acute kidney injury on chronic kidney disease stage IIIb/none anion gap metabolic acidosis Renal function has recovered.  Monitor urine output.  Avoid nephrotoxic agents.  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 Previous rounding physician discussed with ID and apparently patient is being treated with Bicillin on a weekly basis.  Fluid in the right inguinal  canal This was noted on recent CT scan as well as scrotal ultrasound.  Significance of this is unclear.  Would recommend that the [place] be  repeated in 1 to 2 months.  History of coronary artery disease Stable.  Bipolar disorder Stable.  Diabetes mellitus type 2, uncontrolled with hyperglycemia On SSI.  Monitor CBGs.  Oral thrush On Diflucan.  Goals of care Patient has a legal guardian.  Discussions were held with legal guardian regarding his poor prognosis.  Palliative care was also consulted.  Patient transitioned to DNR.  DVT Prophylaxis: On Lovenox Code Status: DNR Family Communication: No family at bedside Disposition Plan: SNF when medically stable     Medications: Scheduled:  Chlorhexidine Gluconate Cloth  6 each Topical Daily   cholestyramine light  4 g Oral QID   enoxaparin (LOVENOX) injection  80 mg Subcutaneous Q12H   fluconazole  200 mg Oral Daily   Gerhardt's butt cream   Topical BID   insulin glargine-yfgn  8 Units Subcutaneous QHS   levETIRAcetam  500 mg Oral BID   magic mouthwash  10 mL Oral QID   metoprolol tartrate  25 mg Oral BID   nystatin  5 mL Oral QID   mouth rinse  15 mL Mouth Rinse 4 times per day   pantoprazole  40 mg Oral Daily   PARoxetine  10 mg Oral Daily   potassium chloride  40 mEq Oral Q4H   simethicone  40 mg Oral QID   simvastatin  20 mg Oral QHS   sodium bicarbonate  650 mg Oral BID   sodium chloride flush  3 mL Intravenous Q12H   Continuous:  piperacillin-tazobactam (ZOSYN)  IV 12.5 mL/hr at 07/23/23 0523   potassium chloride 10 mEq (07/23/23 0757)   YNW:[id number], albuterol, HYDROmorphone (DILAUDID) injection, iohexol, ondansetron **OR** ondansetron (ZOFRAN) IV, mouth rinse, sodium chloride flush  Antibiotics: Anti-infectives (From admission, onward)    Start     Dose/Rate Route Frequency Ordered Stop   07/20/23 1000  fluconazole (DIFLUCAN) tablet 200 mg        200 mg Oral Daily 07/19/23 1145     07/19/23 1300  fluconazole (DIFLUCAN) tablet 100 mg        100 mg Oral  Once 07/19/23 1145 07/19/23 1303   07/18/23 1830  piperacillin-tazobactam (ZOSYN) IVPB  3.375 g        3.375 g 12.5 mL/hr over 240 Minutes Intravenous Every 8 hours 07/18/23 1808     07/16/23 1300  fluconazole (DIFLUCAN) tablet 100 mg  Status:  Discontinued        100 mg Oral Daily 07/16/23 1154 07/19/23 1145   07/05/23 1600  fluconazole (DIFLUCAN) IVPB 200 mg        200 mg 100 mL/hr over 60 Minutes Intravenous Every 24 hours 07/05/23 1320 07/08/23 1658   07/02/23 1000  fluconazole (DIFLUCAN) tablet 100 mg  Status:  Discontinued        100 mg Oral Daily 07/02/23 0802 07/05/23 1320   07/02/23 0800  penicillin g benzathine (BICILLIN LA) 1200000 UNIT/2ML injection 2.4 Million Units        2.4 Million Units Intramuscular Weekly 07/01/23 1209 07/09/23 0950   06/26/23 1500  penicillin g benzathine (BICILLIN LA) 1200000 UNIT/2ML injection 2.4 Million Units        2.4 Million Units Intramuscular  Once 06/26/23 1351 06/28/23 0018   06/24/23 2200  amoxicillin-clavulanate (AUGMENTIN) 500-125 MG per tablet 1 tablet  Status:  Discontinued  1 tablet Oral Every 12 hours 06/24/23 1450 07/03/23 1202   06/24/23 2200  amoxicillin-clavulanate (AUGMENTIN) 500-125 MG per tablet 1 tablet  Status:  Discontinued        1 tablet Oral 2 times daily 06/24/23 1451 06/24/23 1451   06/21/23 1215  doxycycline (VIBRA-TABS) tablet 100 mg  Status:  Discontinued        100 mg Oral Every 12 hours 06/21/23 1128 07/03/23 1202   06/21/23 1215  amoxicillin-clavulanate (AUGMENTIN) 875-125 MG per tablet 1 tablet  Status:  Discontinued        1 tablet Oral Every 12 hours 06/21/23 1128 06/24/23 1450   06/20/23 1200  vancomycin (VANCOREADY) IVPB 1500 mg/300 mL  Status:  Discontinued        1,500 mg 150 mL/hr over 120 Minutes Intravenous Every 36 hours 06/20/23 0719 06/21/23 1128   06/19/23 0000  vancomycin (VANCOREADY) IVPB 1250 mg/250 mL  Status:  Discontinued        1,250 mg 166.7 mL/hr over 90 Minutes Intravenous Every 36 hours 06/18/23 0939 06/20/23 0719   06/18/23 1800  ceFEPIme (MAXIPIME) 2 g in sodium  chloride 0.9 % 100 mL IVPB  Status:  Discontinued        2 g 200 mL/hr over 30 Minutes Intravenous Every 12 hours 06/18/23 0939 06/21/23 1128   06/18/23 0600  ceFEPIme (MAXIPIME) 2 g in sodium chloride 0.9 % 100 mL IVPB  Status:  Discontinued        2 g 200 mL/hr over 30 Minutes Intravenous Every 24 hours 06/17/23 0920 06/18/23 0939   06/17/23 1400  vancomycin (VANCOCIN) IVPB 1000 mg/200 mL premix  Status:  Discontinued        1,000 mg 200 mL/hr over 60 Minutes Intravenous Every 36 hours 06/17/23 0920 06/18/23 0939   06/16/23 1800  vancomycin (VANCOREADY) IVPB 750 mg/150 mL  Status:  Discontinued        750 mg 150 mL/hr over 60 Minutes Intravenous Every 24 hours 06/15/23 1639 06/17/23 0920   06/16/23 1100  cefTRIAXone (ROCEPHIN) 2 g in sodium chloride 0.9 % 100 mL IVPB  Status:  Discontinued        2 g 200 mL/hr over 30 Minutes Intravenous Every 24 hours 06/15/23 1453 06/15/23 1455   06/15/23 1800  ceFEPIme (MAXIPIME) 2 g in sodium chloride 0.9 % 100 mL IVPB  Status:  Discontinued        2 g 200 mL/hr over 30 Minutes Intravenous Every 12 hours 06/15/23 1558 06/17/23 0920   06/15/23 1700  metroNIDAZOLE (FLAGYL) IVPB 500 mg  Status:  Discontinued        500 mg 100 mL/hr over 60 Minutes Intravenous Every 12 hours 06/15/23 1455 06/21/23 1128   06/15/23 1630  vancomycin (VANCOREADY) IVPB 1500 mg/300 mL        1,500 mg 150 mL/hr over 120 Minutes Intravenous  Once 06/15/23 1558 06/15/23 2058   06/15/23 1045  cefTRIAXone (ROCEPHIN) 2 g in sodium chloride 0.9 % 100 mL IVPB        2 g 200 mL/hr over 30 Minutes Intravenous  Once 06/15/23 1041 06/15/23 1155       Objective:  Vital Signs  Vitals:   07/22/23 1152 07/22/23 1955 07/23/23 0433 07/23/23 0500  BP: 123/78 (!) 130/104 128/85   Pulse: 94 (!) 107 80   Resp: 18 18 18    Temp: 98.7 F (37.1 C) 99 F (37.2 C) 97.9 F (36.6 C)   TempSrc: Oral Oral Oral  SpO2: 99% 99% 100%   Weight:    81 kg  Height:        Intake/Output  Summary (Last 24 hours) at 07/23/2023 0840 Last data filed at 07/23/2023 0523 Gross per 24 hour  Intake 436.64 ml  Output 1100 ml  Net -663.36 ml   Filed Weights   07/21/23 0419 07/22/23 0500 07/23/23 0500  Weight: 77.5 kg 81.8 kg 81 kg   General appearance: Awake alert.  In no distress Resp: Clear to auscultation bilaterally.  Normal effort Cardio: S1-S2 is normal regular.  No S3-S4.  No rubs murmurs or bruit GI: Abdomen is soft.  Nontender nondistended.  Bowel sounds are present normal.  No masses organomegaly Extremities: No edema.  Physical deconditioning noted No focal neurological deficits  Lab Results:  Data Reviewed: I have personally reviewed following labs and reports of the imaging studies  CBC: Recent Labs  Lab 07/17/23 0215 07/18/23 0530 07/19/23 0514 07/20/23 0210 07/21/23 0054 07/22/23 0415  WBC 5.4 4.9 5.0 5.5 5.7 6.1  NEUTROABS 3.1 2.9 3.1 3.0 3.0  --   HGB 8.9* 9.1* 8.9* 8.7* 8.7* 8.7*  HCT 27.8* 28.6* 27.8* 26.6* 26.7* 26.8*  MCV 90.3 90.8 88.0 88.7 88.4 88.4  PLT 197 208 227 218 276 239    Basic Metabolic Panel: Recent Labs  Lab 07/20/23 0210 07/20/23 1105 07/20/23 2113 07/21/23 0054 07/21/23 2008 07/22/23 0415 07/22/23 1824 07/23/23 0435  NA 137 138 138 135  --  137  --  139  K 3.1* 5.3* 2.7* 2.6* 2.6* 3.2* 3.3* 2.9*  CL 111 112* 109 108  --  112*  --  110  CO2 19* 20* 20* 21*  --  21*  --  21*  GLUCOSE 92 88 114* 95  --  88  --  79  BUN <5* <5* <5* <5*  --  <5*  --  <5*  CREATININE 1.09 1.09 1.08 1.07  --  1.15  --  1.19  CALCIUM 7.1* 7.2* 7.5* 7.3*  --  7.4*  --  7.5*  MG 2.0  --  2.0 2.0  --  1.9  --  2.1  PHOS 1.9* 2.1* 2.7 2.4* 1.7*  --   --   --     GFR: Estimated Creatinine Clearance: 50.1 mL/min (by C-G formula based on SCr of 1.19 mg/dL).  Liver Function Tests: Recent Labs  Lab 07/17/23 0215 07/18/23 0530 07/19/23 0514 07/20/23 0210 07/21/23 0054  AST 33 34 33 29 26  ALT 26 27 27 24 22   ALKPHOS 27* 30* 25* 25*  26*  BILITOT 0.5 0.5 0.7 0.5 0.6  PROT 5.9* 6.0* 6.0* 5.9* 6.2*  ALBUMIN 2.6* 2.6* 2.6* 2.5* 2.6*     CBG: Recent Labs  Lab 07/22/23 1809 07/22/23 1958 07/23/23 0016 07/23/23 0434 07/23/23 0817  GLUCAP 95 111* 96 76 77     Recent Results (from the past 240 hour(s))  Gastrointestinal Panel by PCR , Stool     Status: None   Collection Time: 07/19/23 11:40 AM   Specimen: STOOL  Result Value Ref Range Status   Campylobacter species NOT DETECTED NOT DETECTED Final   Plesimonas shigelloides NOT DETECTED NOT DETECTED Final   Salmonella species NOT DETECTED NOT DETECTED Final   Yersinia enterocolitica NOT DETECTED NOT DETECTED Final   Vibrio species NOT DETECTED NOT DETECTED Final   Vibrio cholerae NOT DETECTED NOT DETECTED Final   Enteroaggregative E coli (EAEC) NOT DETECTED NOT DETECTED Final   Enteropathogenic E coli (  EPEC) NOT DETECTED NOT DETECTED Final   Enterotoxigenic E coli (ETEC) NOT DETECTED NOT DETECTED Final   Shiga like toxin producing E coli (STEC) NOT DETECTED NOT DETECTED Final   Shigella/Enteroinvasive E coli (EIEC) NOT DETECTED NOT DETECTED Final   Cryptosporidium NOT DETECTED NOT DETECTED Final   Cyclospora cayetanensis NOT DETECTED NOT DETECTED Final   Entamoeba histolytica NOT DETECTED NOT DETECTED Final   Giardia lamblia NOT DETECTED NOT DETECTED Final   Adenovirus F40/41 NOT DETECTED NOT DETECTED Final   Astrovirus NOT DETECTED NOT DETECTED Final   Norovirus GI/GII NOT DETECTED NOT DETECTED Final   Rotavirus A NOT DETECTED NOT DETECTED Final   Sapovirus (I, II, IV, and V) NOT DETECTED NOT DETECTED Final    Comment: Performed at [facility], [address] [place], [place] [address]  C Difficile Quick Screen (NO PCR Reflex)     Status: None   Collection Time: 07/19/23 11:40 AM   Specimen: STOOL  Result Value Ref Range Status   C Diff antigen NEGATIVE NEGATIVE Final   C Diff toxin NEGATIVE NEGATIVE Final   C Diff interpretation No C.  difficile detected.  Final    Comment: Performed at [facility], [address], [place], [place] [address]      Radiology Studies: No results found.     LOS: 38 days   Damarrion Mimbs [employer] on [email or web address]  07/23/2023, 8:40 AM

## 2023-07-23 NOTE — Progress Notes (Signed)
Subjective: Continues to have liquid loose watery bowel movement in Flexi-Seal. Stool output slightly increased from 1350 mL in 24 hours to 700 mL in the last .24 hours  Objective: Vital signs in last 24 hours: Temp:  [97.9 F (36.6 C)-99 F (37.2 C)] 97.9 F (36.6 C) (11/22 0433) Pulse Rate:  [80-107] 80 (11/22 0433) Resp:  [18] 18 (11/22 0433) BP: (128-130)/(85-104) 128/85 (11/22 0433) SpO2:  [99 %-100 %] 100 % (11/22 0433) Weight:  [81 kg] 81 kg (11/22 0500) Weight change: -0.8 kg Last BM Date : 07/22/23  PE: Difficult to understand, mostly answers as yes or no GENERAL: Not in distress but appears chronically ill  ABDOMEN: Nondistended, bowel sounds audible EXTREMITIES: No deformity  Lab Results: Results for orders placed or performed during the hospital encounter of 06/15/23 (from the past 48 hour(s))  Glucose, capillary     Status: None   Collection Time: 07/21/23  4:56 PM  Result Value Ref Range   Glucose-Capillary 80 70 - 99 mg/dL    Comment: Glucose reference range applies only to samples taken after fasting for at least 8 hours.   Comment 1 Notify RN    Comment 2 Document in Chart   Potassium     Status: Abnormal   Collection Time: 07/21/23  8:08 PM  Result Value Ref Range   Potassium 2.6 (LL) 3.5 - 5.1 mmol/L    Comment: CRITICAL RESULT CALLED TO, READ BACK BY AND VERIFIED WITH Margret Chance, RN 07/21/23 2053 BY [email or web address] Performed at [facility], [address] [place], [place] [address]   Phosphorus     Status: Abnormal   Collection Time: 07/21/23  8:08 PM  Result Value Ref Range   Phosphorus 1.7 (L) 2.5 - 4.6 mg/dL    Comment: Performed at [facility], [address] [place], [place] [address]  Glucose, capillary     Status: None   Collection Time: 07/21/23  8:30 PM  Result Value Ref Range   Glucose-Capillary 94 70 - 99 mg/dL    Comment: Glucose reference range applies only to samples taken after fasting for at least  8 hours.  Glucose, capillary     Status: None   Collection Time: 07/21/23 11:54 PM  Result Value Ref Range   Glucose-Capillary 85 70 - 99 mg/dL    Comment: Glucose reference range applies only to samples taken after fasting for at least 8 hours.  Glucose, capillary     Status: None   Collection Time: 07/22/23  3:56 AM  Result Value Ref Range   Glucose-Capillary 87 70 - 99 mg/dL    Comment: Glucose reference range applies only to samples taken after fasting for at least 8 hours.  CBC     Status: Abnormal   Collection Time: 07/22/23  4:15 AM  Result Value Ref Range   WBC 6.1 4.0 - 10.5 K/uL   RBC 3.03 (L) 4.22 - 5.81 MIL/uL   Hemoglobin 8.7 (L) 13.0 - 17.0 g/dL   HCT 82.9 (L) 56.2 - 13.0 %   MCV 88.4 80.0 - 100.0 fL   MCH 28.7 26.0 - 34.0 pg   MCHC 32.5 30.0 - 36.0 g/dL   RDW 86.5 78.4 - 69.6 %   Platelets 239 150 - 400 K/uL   nRBC 0.0 0.0 - 0.2 %    Comment: Performed at [facility], [address], [place], [place] [address]  Basic metabolic panel     Status: Abnormal  Collection Time: 07/22/23  4:15 AM  Result Value Ref Range   Sodium 137 135 - 145 mmol/L   Potassium 3.2 (L) 3.5 - 5.1 mmol/L   Chloride 112 (H) 98 - 111 mmol/L   CO2 21 (L) 22 - 32 mmol/L   Glucose, Bld 88 70 - 99 mg/dL    Comment: Glucose reference range applies only to samples taken after fasting for at least 8 hours.   BUN <5 (L) 8 - 23 mg/dL   Creatinine, Ser 8.41 0.61 - 1.24 mg/dL   Calcium 7.4 (L) 8.9 - 10.3 mg/dL   GFR, Estimated >32 >44 mL/min    Comment: (NOTE) Calculated using the CKD-EPI Creatinine Equation (2021)    Anion gap 4 (L) 5 - 15    Comment: Performed at [facility], [address] [place], [place] [address]  Magnesium     Status: None   Collection Time: 07/22/23  4:15 AM  Result Value Ref Range   Magnesium 1.9 1.7 - 2.4 mg/dL    Comment: Performed at [facility], [address] [place], [place] [address]  Glucose,  capillary     Status: None   Collection Time: 07/22/23  7:33 AM  Result Value Ref Range   Glucose-Capillary 93 70 - 99 mg/dL    Comment: Glucose reference range applies only to samples taken after fasting for at least 8 hours.  Glucose, capillary     Status: Abnormal   Collection Time: 07/22/23 11:49 AM  Result Value Ref Range   Glucose-Capillary 126 (H) 70 - 99 mg/dL    Comment: Glucose reference range applies only to samples taken after fasting for at least 8 hours.  Glucose, capillary     Status: None   Collection Time: 07/22/23  6:09 PM  Result Value Ref Range   Glucose-Capillary 95 70 - 99 mg/dL    Comment: Glucose reference range applies only to samples taken after fasting for at least 8 hours.  Potassium     Status: Abnormal   Collection Time: 07/22/23  6:24 PM  Result Value Ref Range   Potassium 3.3 (L) 3.5 - 5.1 mmol/L    Comment: Performed at [facility], [address] [place], [place] [address]  Glucose, capillary     Status: Abnormal   Collection Time: 07/22/23  7:58 PM  Result Value Ref Range   Glucose-Capillary 111 (H) 70 - 99 mg/dL    Comment: Glucose reference range applies only to samples taken after fasting for at least 8 hours.  Glucose, capillary     Status: None   Collection Time: 07/23/23 12:16 AM  Result Value Ref Range   Glucose-Capillary 96 70 - 99 mg/dL    Comment: Glucose reference range applies only to samples taken after fasting for at least 8 hours.  Glucose, capillary     Status: None   Collection Time: 07/23/23  4:34 AM  Result Value Ref Range   Glucose-Capillary 76 70 - 99 mg/dL    Comment: Glucose reference range applies only to samples taken after fasting for at least 8 hours.  Basic metabolic panel     Status: Abnormal   Collection Time: 07/23/23  4:35 AM  Result Value Ref Range   Sodium 139 135 - 145 mmol/L   Potassium 2.9 (L) 3.5 - 5.1 mmol/L   Chloride 110 98 - 111 mmol/L   CO2 21 (L) 22 - 32 mmol/L   Glucose, Bld  79 70 -  99 mg/dL    Comment: Glucose reference range applies only to samples taken after fasting for at least 8 hours.   BUN <5 (L) 8 - 23 mg/dL   Creatinine, Ser 1.19 0.61 - 1.24 mg/dL   Calcium 7.5 (L) 8.9 - 10.3 mg/dL   GFR, Estimated >14 >78 mL/min    Comment: (NOTE) Calculated using the CKD-EPI Creatinine Equation (2021)    Anion gap 8 5 - 15    Comment: Performed at [facility], [address] [place], [place] [address]  Magnesium     Status: None   Collection Time: 07/23/23  4:35 AM  Result Value Ref Range   Magnesium 2.1 1.7 - 2.4 mg/dL    Comment: Performed at [facility], [address], [place], [place] [address]  Glucose, capillary     Status: None   Collection Time: 07/23/23  8:17 AM  Result Value Ref Range   Glucose-Capillary 77 70 - 99 mg/dL    Comment: Glucose reference range applies only to samples taken after fasting for at least 8 hours.    Studies/Results: No results found.  Medications: I have reviewed the patient's current medications.  Assessment: Diarrhea, continues to have high output of watery stool in Flexi-Seal despite increasing cholestyramine to 4 g 4 times a day  Continued hypokalemia, potassium 2.9 today  Plan: Will add Imodium 2 mg once a day, if needed can be increased up to 3 times a day. Consider discontinuing IV Zosyn from tomorrow, WBC count is within normal limits, patient remains afebrile, diarrhea may be worsened by Zosyn. Dr. Levora Angel to follow tomorrow.   Kerin Salen, MD 07/23/2023, 12:32 PM

## 2023-07-23 NOTE — Progress Notes (Signed)
Physical Therapy Treatment Patient Details Name: Eric Zamora MRN: [id number] DOB: [birth year] Today's Date: 07/23/2023   History of Present Illness Pt is 77 yo admitted with severe sepsis due to perirectal abscess.  Pt to OR on 10/18 for drainage of abscess.  Pt with increased lethargy on 10/24 , MRI Head performed and pt found to have acute infarct  right frontal operculum with moderate associated cytotoxic edema and petechial hemorrhage. Pt with new onset Afib RVR during admission. Pt found to have ileus 07/06/23 and GI consulted.   PMH: bipolar disorder, dementia, history of NSTEMI, hypertrophic cardiomyopathy    PT Comments  Pt with similar presentation to last visit.  Needs mod  Ax 2 for OOB with posterior and R lean.  Required increased cues and time.  Pt continues to have loose stools, with flexi-seal, low potassium (received meds) potentially continuing to limit strength, endurance, mobility. Cont POC with Patient will benefit from continued inpatient follow up therapy, <3 hours/day at d/c.     If plan is discharge home, recommend the following: Two people to help with walking and/or transfers;Two people to help with bathing/dressing/bathroom;Assistance with cooking/housework;Direct supervision/assist for financial management;Assist for transportation;Help with stairs or ramp for entrance   Can travel by private vehicle     No  Equipment Recommendations  None recommended by PT    Recommendations for Other Services       Precautions / Restrictions Precautions Precautions: Fall Precaution Comments: monitor HR Restrictions Weight Bearing Restrictions: No     Mobility  Bed Mobility Overal bed mobility: Needs Assistance Bed Mobility: Supine to Sit     Supine to sit: Mod assist     General bed mobility comments: increased time  and cues for hand placement and leaning forward    Transfers Overall transfer level: Needs assistance   Transfers: Sit to/from Stand Sit to  Stand: Mod assist, +2 physical assistance   Step pivot transfers: Mod assist, +2 physical assistance       General transfer comment: Performed STS x 4 with cues for hand placement, feet blocked from going midline, and mod x 2 to rise.  Step pivot to chair with heavy mod x 2 (support on R side for balance, assist to weight shift, assist for RW)    Ambulation/Gait               General Gait Details: too fatigued, requesting to sit   Stairs             Wheelchair Mobility     Tilt Bed    Modified Rankin (Stroke Patients Only) Modified Rankin (Stroke Patients Only) Pre-Morbid Rankin Score: Moderate disability Modified Rankin: Severe disability     Balance Overall balance assessment: Needs assistance Sitting-balance support: No upper extremity supported Sitting balance-Leahy Scale: Fair Sitting balance - Comments: Pt able to sit EOB without UE support; tends to lean posterior with challenges.  Cues on leaning forward.  Reaching to target forward and across body x 3 each hand     Standing balance-Leahy Scale: Poor Standing balance comment: mod A x 2 R and posterior lean; cues to spreach feet , tuck buttock , lift shoulders                            Cognition Arousal: Alert Behavior During Therapy: WFL for tasks assessed/performed Overall Cognitive Status: No family/caregiver present to determine baseline cognitive functioning  General Comments: alert, answers simple questions, slurred speach, follows commands with increased time and cuing        Exercises      General Comments        Pertinent Vitals/Pain Pain Assessment Pain Assessment: No/denies pain    Home Living                          Prior Function            PT Goals (current goals can now be found in the care plan section) Progress towards PT goals: Progressing toward goals    Frequency    Min 1X/week       PT Plan      Co-evaluation PT/OT/SLP Co-Evaluation/Treatment: Yes Reason for Co-Treatment: Complexity of the patient's impairments (multi-system involvement) PT goals addressed during session: Mobility/safety with mobility;Balance;Proper use of DME;Strengthening/ROM OT goals addressed during session: ADL's and self-care;Proper use of Adaptive equipment and DME      AM-PAC PT "6 Clicks" Mobility   Outcome Measure  Help needed turning from your back to your side while in a flat bed without using bedrails?: A Lot Help needed moving from lying on your back to sitting on the side of a flat bed without using bedrails?: A Lot Help needed moving to and from a bed to a chair (including a wheelchair)?: Total Help needed standing up from a chair using your arms (e.g., wheelchair or bedside chair)?: Total Help needed to walk in hospital room?: Total Help needed climbing 3-5 steps with a railing? : Total 6 Click Score: 8    End of Session Equipment Utilized During Treatment: Gait belt Activity Tolerance: Patient tolerated treatment well Patient left: with chair alarm set;in chair;with call bell/phone within reach Nurse Communication: Mobility status;Need for lift equipment (STEDY vs Maxi move) PT Visit Diagnosis: Other abnormalities of gait and mobility (R26.89);Muscle weakness (generalized) (M62.81)     Time: 2025-4270 PT Time Calculation (min) (ACUTE ONLY): 23 min  Charges:    $Therapeutic Activity: 8-22 mins PT General Charges $$ ACUTE PT VISIT: 1 Visit                     Anise Salvo, PT Acute Rehab [facility] [phone]    Rayetta Humphrey 07/23/2023, 12:25 PM

## 2023-07-23 NOTE — TOC Progression Note (Signed)
Transition of Care [facility]) - Progression Note    Patient Details  Name: Eric Zamora MRN: [id number] Date of Birth: 06/28/1946  [facility]) CM/SW Contact  Otelia Santee, LCSW Phone Number: 07/23/2023, 9:29 AM  Clinical Narrative:    TOC continuing to follow.    Expected Discharge Plan: Skilled Nursing Facility Barriers to Discharge: SNF Pending bed offer, Inadequate or no insurance  Expected Discharge Plan and Services     Post Acute Care Choice: Skilled Nursing Facility Living arrangements for the past 2 months: Assisted Living Facility [facility])                                       Social Determinants of Health (SDOH) Interventions SDOH Screenings   Food Insecurity: Patient Unable To Answer (06/15/2023)  Housing: Low Risk  (06/15/2023)  Transportation Needs: Patient Unable To Answer (06/15/2023)  Utilities: Patient Unable To Answer (06/15/2023)  Tobacco Use: Low Risk  (07/05/2023)    Readmission Risk Interventions    06/25/2023    2:17 PM  Readmission Risk Prevention Plan  Transportation Screening Complete  PCP or Specialist Appt within 5-7 Days Complete  Home Care Screening Complete  Medication Review (RN CM) Complete

## 2023-07-23 NOTE — Progress Notes (Signed)
Occupational Therapy Treatment Patient Details Name: Eric Zamora MRN: [id number] DOB: 1946/01/03 Today's Date: 07/23/2023   History of present illness Pt is 77 yo admitted with severe sepsis due to perirectal abscess.  Pt to OR on 10/18 for drainage of abscess.  Pt with increased lethargy on 10/24 , MRI Head performed and pt found to have acute infarct  right frontal operculum with moderate associated cytotoxic edema and petechial hemorrhage. Pt with new onset Afib RVR during admission. Pt found to have ileus 07/06/23 and GI consulted.   PMH: bipolar disorder, dementia, history of NSTEMI, hypertrophic cardiomyopathy   OT comments  Patient progressing and showed improved functional use of BUEs and improved dynamic sitting balance with promts for anterior weight shifts and reaching in different planes. Pt able to perform seated ADLs without assist other than setup and cues,  compared to previous session. Patient remains limited by impaired control of Les in standing, seemingly ataxic and overly narrow BOS, necessitating up to Max As of 2 people and RW, generalized weakness and decreased activity tolerance along with deficits noted below. Pt seems to have deficits in judging his position in space and correcting it without assistance/cues. Pt continues to demonstrate fair rehab potential and would benefit from continued skilled OT to increase safety and independence with ADLs and functional transfers to allow pt to return home safely and reduce caregiver burden and fall risk.       If plan is discharge home, recommend the following:  Two people to help with walking and/or transfers;Two people to help with bathing/dressing/bathroom;Direct supervision/assist for medications management;Help with stairs or ramp for entrance;Supervision due to cognitive status;Assist for transportation   Equipment Recommendations   (TBD)    Recommendations for Other Services      Precautions / Restrictions  Precautions Precautions: Fall Precaution Comments: monitor HR Restrictions Weight Bearing Restrictions: No       Mobility Bed Mobility Overal bed mobility: Needs Assistance Bed Mobility: Supine to Sit, Sidelying to Sit Rolling: Mod assist, Used rails Sidelying to sit: Mod assist, Used rails, HOB elevated Supine to sit: Mod assist          Transfers                         Balance Overall balance assessment: Needs assistance Sitting-balance support: No upper extremity supported, Feet supported Sitting balance-Leahy Scale: Poor (Fait with anterior reaching but tesnds to posteriorly lose balance is sitting upright.) Sitting balance - Comments: Pt able to sit EOB without UE support; tends to lean posterior with challenges.  Cues on leaning forward.  Reaching to target forward and across body x 3 each hand Postural control: Posterior lean, Right lateral lean Standing balance support: Bilateral upper extremity supported Standing balance-Leahy Scale: Poor Standing balance comment: max A x 2 R and posterior lean; cues to spreach feet , tuck buttock , lift shoulders                           ADL either performed or assessed with clinical judgement   ADL Overall ADL's : Needs assistance/impaired     Grooming: Sitting;Set up;Wash/dry face;Oral care;Wash/dry hands Grooming Details (indicate cue type and reason): Increased time allow pt to manipulate toothpaste, apply paste to brush. Cues to expectorate needed.  Simple setup for face/hand hygiene.             Lower Body Dressing: Total assistance;Bed level Lower Body  Dressing Details (indicate cue type and reason): for donning socks in bed Toilet Transfer: Maximal assistance;Stand-pivot;Rolling walker (2 wheels);Cueing for sequencing;Cueing for safety;+2 for physical assistance;+2 for safety/equipment Toilet Transfer Details (indicate cue type and reason): Pt stood from EOB to RW with need of feet blocked step  by step cues and Mod-Max As of 2. Pt pivoted to recliner with Max As of 2 and RW. Stand to sit with Mod-Max As of 2. Pt stood from recliner with Max As of 2 due to LT foot too far forward. Max cues and MAx As for pt to stand upright and take a step back to chair with each foot using RW. Toileting- Clothing Manipulation and Hygiene: Total assistance;Bed level Toileting - Clothing Manipulation Details (indicate cue type and reason): Flexiseal and external catheter.     Functional mobility during ADLs: Moderate assistance;+2 for physical assistance;+2 for safety/equipment;Maximal assistance;Rolling walker (2 wheels);Cueing for safety;Cueing for sequencing      Extremity/Trunk Assessment Upper Extremity Assessment Upper Extremity Assessment: Generalized weakness;Right hand dominant       Cervical / Trunk Assessment Cervical / Trunk Assessment: Other exceptions Cervical / Trunk Exceptions: RT leaning tendency since CVA    Vision Baseline Vision/History: 1 Wears glasses Additional Comments: Have not assessed since CVA.   Perception     Praxis      Cognition Arousal: Alert Behavior During Therapy: WFL for tasks assessed/performed Overall Cognitive Status: No family/caregiver present to determine baseline cognitive functioning                                 General Comments: alert, answers simple questions, slurred speach/dysarthric, follows commands with increased time and cuing        Exercises      Shoulder Instructions       General Comments      Pertinent Vitals/ Pain       Pain Assessment Pain Assessment: No/denies pain Faces Pain Scale: No hurt  Home Living                                          Prior Functioning/Environment              Frequency  Min 1X/week        Progress Toward Goals  OT Goals(current goals can now be found in the care plan section)  Progress towards OT goals: Progressing toward goals  Acute  Rehab OT Goals OT Goal Formulation: Patient unable to participate in goal setting Time For Goal Achievement: 07/26/23 Potential to Achieve Goals: Fair  Plan      Co-evaluation    PT/OT/SLP Co-Evaluation/Treatment: Yes Reason for Co-Treatment: Complexity of the patient's impairments (multi-system involvement) PT goals addressed during session: Mobility/safety with mobility;Balance;Proper use of DME;Strengthening/ROM OT goals addressed during session: ADL's and self-care;Proper use of Adaptive equipment and DME      AM-PAC OT "6 Clicks" Daily Activity     Outcome Measure   Help from another person eating meals?: A Little Help from another person taking care of personal grooming?: A Little Help from another person toileting, which includes using toliet, bedpan, or urinal?: Total Help from another person bathing (including washing, rinsing, drying)?: A Lot Help from another person to put on and taking off regular upper body clothing?: A Lot Help from another person to put on  and taking off regular lower body clothing?: Total 6 Click Score: 12    End of Session Equipment Utilized During Treatment: Gait belt;Rolling walker (2 wheels)  OT Visit Diagnosis: Other abnormalities of gait and mobility (R26.89);Muscle weakness (generalized) (M62.81);Cognitive communication deficit (R41.841) Symptoms and signs involving cognitive functions: Cerebral infarction   Activity Tolerance Patient tolerated treatment well   Patient Left with call bell/phone within reach;in chair;with chair alarm set;with nursing/sitter in room   Nurse Communication Mobility status        Time: 9562-1308 OT Time Calculation (min): 26 min  Charges: OT General Charges $OT Visit: 1 Visit OT Treatments $Self Care/Home Management : 8-22 mins  Victorino Dike, OT Acute Rehab Services Office: [phone] 07/23/2023  Theodoro Clock 07/23/2023, 12:38 PM

## 2023-07-24 DIAGNOSIS — I4891 Unspecified atrial fibrillation: Secondary | ICD-10-CM | POA: Diagnosis not present

## 2023-07-24 DIAGNOSIS — K529 Noninfective gastroenteritis and colitis, unspecified: Secondary | ICD-10-CM | POA: Diagnosis not present

## 2023-07-24 DIAGNOSIS — E876 Hypokalemia: Secondary | ICD-10-CM | POA: Diagnosis not present

## 2023-07-24 LAB — BASIC METABOLIC PANEL
Anion gap: 7 (ref 5–15)
BUN: <5 mg/dL — ABNORMAL LOW (ref 8–23)
CO2: 21 mmol/L — ABNORMAL LOW (ref 22–32)
Calcium: 7.7 mg/dL — ABNORMAL LOW (ref 8.9–10.3)
Chloride: 112 mmol/L — ABNORMAL HIGH (ref 98–111)
Creatinine, Ser: 1.23 mg/dL (ref 0.61–1.24)
GFR, Estimated: >60 mL/min (ref >60)
Glucose, Bld: 96 mg/dL (ref 70–99)
Potassium: 3.1 mmol/L — ABNORMAL LOW (ref 3.5–5.1)
Sodium: 140 mmol/L (ref 135–145)

## 2023-07-24 LAB — GLUCOSE, CAPILLARY
Glucose-Capillary: 102 mg/dL — ABNORMAL HIGH (ref 70–99)
Glucose-Capillary: 114 mg/dL — ABNORMAL HIGH (ref 70–99)
Glucose-Capillary: 130 mg/dL — ABNORMAL HIGH (ref 70–99)
Glucose-Capillary: 83 mg/dL (ref 70–99)
Glucose-Capillary: 87 mg/dL (ref 70–99)
Glucose-Capillary: 90 mg/dL (ref 70–99)

## 2023-07-24 LAB — MAGNESIUM: Magnesium: 2.1 mg/dL (ref 1.7–2.4)

## 2023-07-24 MED ORDER — POTASSIUM CHLORIDE 10 MEQ/100ML IV SOLN
10.0000 meq | INTRAVENOUS | Status: AC
  Administered 2023-07-24 (×4): 10 meq via INTRAVENOUS
  Filled 2023-07-24 (×4): qty 100

## 2023-07-24 MED ORDER — POTASSIUM CHLORIDE CRYS ER 20 MEQ PO TBCR
40.0000 meq | EXTENDED_RELEASE_TABLET | Freq: Once | ORAL | Status: AC
  Administered 2023-07-24: 40 meq via ORAL
  Filled 2023-07-24: qty 2

## 2023-07-24 NOTE — Plan of Care (Signed)
Problem: Education: Goal: Ability to describe self-care measures that may prevent or decrease complications (Diabetes Survival Skills Education) will improve Outcome: Progressing Goal: Individualized Educational Video(s) Outcome: Progressing   Problem: Coping: Goal: Ability to adjust to condition or change in health will improve Outcome: Progressing   Problem: Fluid Volume: Goal: Ability to maintain a balanced intake and output will improve Outcome: Progressing   Problem: Health Behavior/Discharge Planning: Goal: Ability to identify and utilize available resources and services will improve Outcome: Progressing Goal: Ability to manage health-related needs will improve Outcome: Progressing   Problem: Metabolic: Goal: Ability to maintain appropriate glucose levels will improve Outcome: Progressing   Problem: Nutritional: Goal: Maintenance of adequate nutrition will improve Outcome: Progressing Goal: Progress toward achieving an optimal weight will improve Outcome: Progressing   Problem: Skin Integrity: Goal: Risk for impaired skin integrity will decrease Outcome: Progressing   Problem: Tissue Perfusion: Goal: Adequacy of tissue perfusion will improve Outcome: Progressing   Problem: Education: Goal: Knowledge of General Education information will improve Description: Including pain rating scale, medication(s)/side effects and non-pharmacologic comfort measures Outcome: Progressing   Problem: Health Behavior/Discharge Planning: Goal: Ability to manage health-related needs will improve Outcome: Progressing   Problem: Clinical Measurements: Goal: Ability to maintain clinical measurements within normal limits will improve Outcome: Progressing Goal: Will remain free from infection Outcome: Progressing Goal: Diagnostic test results will improve Outcome: Progressing Goal: Respiratory complications will improve Outcome: Progressing Goal: Cardiovascular complication will  be avoided Outcome: Progressing   Problem: Activity: Goal: Risk for activity intolerance will decrease Outcome: Progressing   Problem: Nutrition: Goal: Adequate nutrition will be maintained Outcome: Progressing   Problem: Coping: Goal: Level of anxiety will decrease Outcome: Progressing   Problem: Elimination: Goal: Will not experience complications related to bowel motility Outcome: Progressing Goal: Will not experience complications related to urinary retention Outcome: Progressing   Problem: Pain Managment: Goal: General experience of comfort will improve Outcome: Progressing   Problem: Safety: Goal: Ability to remain free from injury will improve Outcome: Progressing   Problem: Skin Integrity: Goal: Risk for impaired skin integrity will decrease Outcome: Progressing   Problem: Fluid Volume: Goal: Hemodynamic stability will improve Outcome: Progressing   Problem: Clinical Measurements: Goal: Diagnostic test results will improve Outcome: Progressing Goal: Signs and symptoms of infection will decrease Outcome: Progressing   Problem: Respiratory: Goal: Ability to maintain adequate ventilation will improve Outcome: Progressing   Problem: Education: Goal: Ability to describe self-care measures that may prevent or decrease complications (Diabetes Survival Skills Education) will improve Outcome: Progressing Goal: Individualized Educational Video(s) Outcome: Progressing   Problem: Cardiac: Goal: Ability to maintain an adequate cardiac output will improve Outcome: Progressing   Problem: Health Behavior/Discharge Planning: Goal: Ability to identify and utilize available resources and services will improve Outcome: Progressing Goal: Ability to manage health-related needs will improve Outcome: Progressing   Problem: Fluid Volume: Goal: Ability to achieve a balanced intake and output will improve Outcome: Progressing   Problem: Metabolic: Goal: Ability to  maintain appropriate glucose levels will improve Outcome: Progressing   Problem: Nutritional: Goal: Maintenance of adequate nutrition will improve Outcome: Progressing Goal: Maintenance of adequate weight for body size and type will improve Outcome: Progressing   Problem: Respiratory: Goal: Will regain and/or maintain adequate ventilation Outcome: Progressing   Problem: Urinary Elimination: Goal: Ability to achieve and maintain adequate renal perfusion and functioning will improve Outcome: Progressing   Problem: Education: Goal: Knowledge of disease or condition will improve Outcome: Progressing Goal: Knowledge of secondary prevention will improve (  MUST DOCUMENT ALL) Outcome: Progressing Goal: Knowledge of patient specific risk factors will improve Loraine Leriche N/A or DELETE if not current risk factor) Outcome: Progressing   Problem: Ischemic Stroke/TIA Tissue Perfusion: Goal: Complications of ischemic stroke/TIA will be minimized Outcome: Progressing

## 2023-07-24 NOTE — Progress Notes (Signed)
[facility] PROGRESS NOTE   STEPHAN STRANZ WUJ:[id number] DOB: 1945/11/23 DOA: 06/15/2023  PCP: Uvaldo Bristle, PA-C  Brief History: 77 y.o. 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, CAD, dCHF, HTN, hx DVT no longer on [facility], and HLD who admitted on 10/15 for severe sepsis due to perirectal abscess.   10/15 admitted to stepdown unit. 10/17: Overnight with fever, aspiration event, new AG acidosis, transferred back to SDU for BiPAP and insulin drip 10/18: taken to OR for drainage of abscess 10/23 cardiology was consulted for new onset A-fib with RVR. 10/24 nephrology was consulted for AKI and hyponatremia.  CT head was obtained for ongoing AMS and was found to have acute/subacute right MCA territory infarct.  Neurology was also consulted. 10/25.  Palliative care was consulted. 10/31.  Anticoagulation started again. 11/1.  Second dose of Bicillin given.  Underwent MBS. 11/2.  Marinol started for poor p.o. intake. 11/4.  Found to have ileus.  GI consulted.  Recommended DNR to legal guardian. 11/5.  Two-physician DNR form was signed and sent to guardian.  Will await decision. Subsequently developed colitis.  Significant diarrhea noted.  GI was consulted.  C. difficile and GI pathogen panel testing were negative.  Consultants: Gastroenterology.  General surgery.  Procedures: Drainage of rectal abscess    Subjective/Interval History: No complaints offered.  Seems to be quite comfortable lying on the bed.  Assessment/Plan:  Perirectal abscess with severe sepsis, present on admission Sepsis physiology improved.   Patient was seen by general surgery and underwent drainage of the abscess on 06/18/2023. Patient was on broad-spectrum antibiotics initially which was transitioned to cefepime and then to Augmentin and doxycycline.  2-week course was recommended from initial I&D.   Antibiotic course was completed but now back on Zosyn  due to new colitis.  Should be able to discontinue today.  Will wait for GI input.  Colitis Gastroenterology is following.  Patient currently on Zosyn.  Has profuse diarrhea.  Stool studies negative so far. Patient was started on cholestyramine.  Dose was increased.  Imodium was added by gastroenterology.  Diarrhea seems to be slowing down.   Await further input today.  Zosyn could be contributing to her diarrhea.  Will wait for GI input and hopefully discontinue Zosyn today.  Today's day 6.    New onset atrial fibrillation with RVR Seen by cardiology.  On metoprolol.  Anticoagulation was with Eliquis but currently on Lovenox due to concern for ileus. May resume apixaban once his GI issues have stabilized.  Acute metabolic encephalopathy/history of dementia/acute right MCA stroke/oropharyngeal dysphagia CT head on 06/20/2023 showed acute versus subacute stroke.  MRI brain showed acute stroke.  MRA did not show any acute abnormalities.  Carotid Dopplers did not show any significant stenosis.  EEG did not show any seizures.  Seen by neurology.  They recommended resumption of anticoagulation. Seen by speech therapy as well.  Currently on dysphagia 1 diet with nectar thick liquids. Mentation seems to be close to baseline now.  History of seizures EEG negative for seizure activity Noted to be on Keppra.    Multiple electrolyte abnormalities including severe hypokalemia Secondary to GI loss.  Continue to supplement potassium.  Magnesium is 2.1.    Normocytic anemia Stable hemoglobin for the most part.  Acute kidney injury on chronic kidney disease stage IIIb/none anion gap metabolic acidosis Renal function has recovered.  Monitor urine output.  Avoid nephrotoxic agents.  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 Previous rounding physician discussed with ID and apparently patient is being treated with Bicillin on a weekly basis.  Fluid in the right inguinal canal This was  noted on recent CT scan as well as scrotal ultrasound.  Significance of this is unclear.  Would recommend that the [place] be repeated in 1 to 2 months.  History of coronary artery disease Stable.  Bipolar disorder Stable.  Diabetes mellitus type 2, uncontrolled with hyperglycemia On SSI.  Monitor CBGs.  Oral thrush On Diflucan.  Goals of care Patient has a legal guardian.  Discussions were held with legal guardian regarding his poor prognosis.  Palliative care was also consulted.  Patient transitioned to DNR.  DVT Prophylaxis: On Lovenox Code Status: DNR Family Communication: No family at bedside Disposition Plan: SNF when medically stable     Medications: Scheduled:  Chlorhexidine Gluconate Cloth  6 each Topical Daily   cholestyramine light  4 g Oral QID   enoxaparin (LOVENOX) injection  80 mg Subcutaneous Q12H   fluconazole  200 mg Oral Daily   Gerhardt's butt cream   Topical BID   insulin glargine-yfgn  8 Units Subcutaneous QHS   levETIRAcetam  500 mg Oral BID   loperamide  2 mg Oral Daily   magic mouthwash  10 mL Oral QID   metoprolol tartrate  25 mg Oral BID   nystatin  5 mL Oral QID   mouth rinse  15 mL Mouth Rinse 4 times per day   pantoprazole  40 mg Oral Daily   PARoxetine  10 mg Oral Daily   potassium chloride  40 mEq Oral Once   simethicone  40 mg Oral QID   simvastatin  20 mg Oral QHS   sodium bicarbonate  650 mg Oral BID   sodium chloride flush  3 mL Intravenous Q12H   Continuous:  piperacillin-tazobactam (ZOSYN)  IV 3.375 g (07/24/23 0937)   potassium chloride 10 mEq (07/24/23 0909)   ZOX:[id number], albuterol, HYDROmorphone (DILAUDID) injection, iohexol, ondansetron **OR** ondansetron (ZOFRAN) IV, mouth rinse, sodium chloride flush  Antibiotics: Anti-infectives (From admission, onward)    Start     Dose/Rate Route Frequency Ordered Stop   07/20/23 1000  fluconazole (DIFLUCAN) tablet 200 mg        200 mg Oral Daily 07/19/23 1145 07/27/23 0959    07/19/23 1300  fluconazole (DIFLUCAN) tablet 100 mg        100 mg Oral  Once 07/19/23 1145 07/19/23 1303   07/18/23 1830  piperacillin-tazobactam (ZOSYN) IVPB 3.375 g        3.375 g 12.5 mL/hr over 240 Minutes Intravenous Every 8 hours 07/18/23 1808     07/16/23 1300  fluconazole (DIFLUCAN) tablet 100 mg  Status:  Discontinued        100 mg Oral Daily 07/16/23 1154 07/19/23 1145   07/05/23 1600  fluconazole (DIFLUCAN) IVPB 200 mg        200 mg 100 mL/hr over 60 Minutes Intravenous Every 24 hours 07/05/23 1320 07/08/23 1658   07/02/23 1000  fluconazole (DIFLUCAN) tablet 100 mg  Status:  Discontinued        100 mg Oral Daily 07/02/23 0802 07/05/23 1320   07/02/23 0800  penicillin g benzathine (BICILLIN LA) 1200000 UNIT/2ML injection 2.4 Million Units        2.4 Million Units Intramuscular Weekly 07/01/23 1209 07/09/23 0950   06/26/23 1500  penicillin g benzathine (BICILLIN LA) 1200000 UNIT/2ML injection 2.4 Million Units  2.4 Million Units Intramuscular  Once 06/26/23 1351 06/28/23 0018   06/24/23 2200  amoxicillin-clavulanate (AUGMENTIN) 500-125 MG per tablet 1 tablet  Status:  Discontinued        1 tablet Oral Every 12 hours 06/24/23 1450 07/03/23 1202   06/24/23 2200  amoxicillin-clavulanate (AUGMENTIN) 500-125 MG per tablet 1 tablet  Status:  Discontinued        1 tablet Oral 2 times daily 06/24/23 1451 06/24/23 1451   06/21/23 1215  doxycycline (VIBRA-TABS) tablet 100 mg  Status:  Discontinued        100 mg Oral Every 12 hours 06/21/23 1128 07/03/23 1202   06/21/23 1215  amoxicillin-clavulanate (AUGMENTIN) 875-125 MG per tablet 1 tablet  Status:  Discontinued        1 tablet Oral Every 12 hours 06/21/23 1128 06/24/23 1450   06/20/23 1200  vancomycin (VANCOREADY) IVPB 1500 mg/300 mL  Status:  Discontinued        1,500 mg 150 mL/hr over 120 Minutes Intravenous Every 36 hours 06/20/23 0719 06/21/23 1128   06/19/23 0000  vancomycin (VANCOREADY) IVPB 1250 mg/250 mL  Status:   Discontinued        1,250 mg 166.7 mL/hr over 90 Minutes Intravenous Every 36 hours 06/18/23 0939 06/20/23 0719   06/18/23 1800  ceFEPIme (MAXIPIME) 2 g in sodium chloride 0.9 % 100 mL IVPB  Status:  Discontinued        2 g 200 mL/hr over 30 Minutes Intravenous Every 12 hours 06/18/23 0939 06/21/23 1128   06/18/23 0600  ceFEPIme (MAXIPIME) 2 g in sodium chloride 0.9 % 100 mL IVPB  Status:  Discontinued        2 g 200 mL/hr over 30 Minutes Intravenous Every 24 hours 06/17/23 0920 06/18/23 0939   06/17/23 1400  vancomycin (VANCOCIN) IVPB 1000 mg/200 mL premix  Status:  Discontinued        1,000 mg 200 mL/hr over 60 Minutes Intravenous Every 36 hours 06/17/23 0920 06/18/23 0939   06/16/23 1800  vancomycin (VANCOREADY) IVPB 750 mg/150 mL  Status:  Discontinued        750 mg 150 mL/hr over 60 Minutes Intravenous Every 24 hours 06/15/23 1639 06/17/23 0920   06/16/23 1100  cefTRIAXone (ROCEPHIN) 2 g in sodium chloride 0.9 % 100 mL IVPB  Status:  Discontinued        2 g 200 mL/hr over 30 Minutes Intravenous Every 24 hours 06/15/23 1453 06/15/23 1455   06/15/23 1800  ceFEPIme (MAXIPIME) 2 g in sodium chloride 0.9 % 100 mL IVPB  Status:  Discontinued        2 g 200 mL/hr over 30 Minutes Intravenous Every 12 hours 06/15/23 1558 06/17/23 0920   06/15/23 1700  metroNIDAZOLE (FLAGYL) IVPB 500 mg  Status:  Discontinued        500 mg 100 mL/hr over 60 Minutes Intravenous Every 12 hours 06/15/23 1455 06/21/23 1128   06/15/23 1630  vancomycin (VANCOREADY) IVPB 1500 mg/300 mL        1,500 mg 150 mL/hr over 120 Minutes Intravenous  Once 06/15/23 1558 06/15/23 2058   06/15/23 1045  cefTRIAXone (ROCEPHIN) 2 g in sodium chloride 0.9 % 100 mL IVPB        2 g 200 mL/hr over 30 Minutes Intravenous  Once 06/15/23 1041 06/15/23 1155       Objective:  Vital Signs  Vitals:   07/23/23 2300 07/24/23 0000 07/24/23 0429 07/24/23 0500  BP:   126/69  Pulse:   (!) 55   Resp: 14 (!) 22 18   Temp:   98 F (36.7  C)   TempSrc:      SpO2:   96%   Weight:    75.6 kg  Height:        Intake/Output Summary (Last 24 hours) at 07/24/2023 0943 Last data filed at 07/24/2023 0546 Gross per 24 hour  Intake 452.3 ml  Output 1300 ml  Net -847.7 ml   Filed Weights   07/22/23 0500 07/23/23 0500 07/24/23 0500  Weight: 81.8 kg 81 kg 75.6 kg   General appearance: Awake alert.  In no distress Resp: Clear to auscultation bilaterally.  Normal effort Cardio: S1-S2 is normal regular.  No S3-S4.  No rubs murmurs or bruit GI: Abdomen is soft.  Nontender nondistended.  Bowel sounds are present normal.  No masses organomegaly   Lab Results:  Data Reviewed: I have personally reviewed following labs and reports of the imaging studies  CBC: Recent Labs  Lab 07/18/23 0530 07/19/23 0514 07/20/23 0210 07/21/23 0054 07/22/23 0415  WBC 4.9 5.0 5.5 5.7 6.1  NEUTROABS 2.9 3.1 3.0 3.0  --   HGB 9.1* 8.9* 8.7* 8.7* 8.7*  HCT 28.6* 27.8* 26.6* 26.7* 26.8*  MCV 90.8 88.0 88.7 88.4 88.4  PLT 208 227 218 276 239    Basic Metabolic Panel: Recent Labs  Lab 07/20/23 0210 07/20/23 1105 07/20/23 2113 07/21/23 0054 07/21/23 2008 07/22/23 0415 07/22/23 1824 07/23/23 0435 07/24/23 0329  NA 137 138 138 135  --  137  --  139 140  K 3.1* 5.3* 2.7* 2.6* 2.6* 3.2* 3.3* 2.9* 3.1*  CL 111 112* 109 108  --  112*  --  110 112*  CO2 19* 20* 20* 21*  --  21*  --  21* 21*  GLUCOSE 92 88 114* 95  --  88  --  79 96  BUN <5* <5* <5* <5*  --  <5*  --  <5* <5*  CREATININE 1.09 1.09 1.08 1.07  --  1.15  --  1.19 1.23  CALCIUM 7.1* 7.2* 7.5* 7.3*  --  7.4*  --  7.5* 7.7*  MG 2.0  --  2.0 2.0  --  1.9  --  2.1 2.1  PHOS 1.9* 2.1* 2.7 2.4* 1.7*  --   --   --   --     GFR: Estimated Creatinine Clearance: 47 mL/min (by C-G formula based on SCr of 1.23 mg/dL).  Liver Function Tests: Recent Labs  Lab 07/18/23 0530 07/19/23 0514 07/20/23 0210 07/21/23 0054  AST 34 33 29 26  ALT 27 27 24 22   ALKPHOS 30* 25* 25* 26*   BILITOT 0.5 0.7 0.5 0.6  PROT 6.0* 6.0* 5.9* 6.2*  ALBUMIN 2.6* 2.6* 2.5* 2.6*     CBG: Recent Labs  Lab 07/23/23 1611 07/23/23 1939 07/23/23 2323 07/24/23 0354 07/24/23 0734  GLUCAP 118* 110* 88 87 83     Recent Results (from the past 240 hour(s))  Gastrointestinal Panel by PCR , Stool     Status: None   Collection Time: 07/19/23 11:40 AM   Specimen: STOOL  Result Value Ref Range Status   Campylobacter species NOT DETECTED NOT DETECTED Final   Plesimonas shigelloides NOT DETECTED NOT DETECTED Final   Salmonella species NOT DETECTED NOT DETECTED Final   Yersinia enterocolitica NOT DETECTED NOT DETECTED Final   Vibrio species NOT DETECTED NOT DETECTED Final   Vibrio cholerae NOT DETECTED NOT DETECTED  Final   Enteroaggregative E coli (EAEC) NOT DETECTED NOT DETECTED Final   Enteropathogenic E coli (EPEC) NOT DETECTED NOT DETECTED Final   Enterotoxigenic E coli (ETEC) NOT DETECTED NOT DETECTED Final   Shiga like toxin producing E coli (STEC) NOT DETECTED NOT DETECTED Final   Shigella/Enteroinvasive E coli (EIEC) NOT DETECTED NOT DETECTED Final   Cryptosporidium NOT DETECTED NOT DETECTED Final   Cyclospora cayetanensis NOT DETECTED NOT DETECTED Final   Entamoeba histolytica NOT DETECTED NOT DETECTED Final   Giardia lamblia NOT DETECTED NOT DETECTED Final   Adenovirus F40/41 NOT DETECTED NOT DETECTED Final   Astrovirus NOT DETECTED NOT DETECTED Final   Norovirus GI/GII NOT DETECTED NOT DETECTED Final   Rotavirus A NOT DETECTED NOT DETECTED Final   Sapovirus (I, II, IV, and V) NOT DETECTED NOT DETECTED Final    Comment: Performed at [facility], [address] [place], [place] [address]  C Difficile Quick Screen (NO PCR Reflex)     Status: None   Collection Time: 07/19/23 11:40 AM   Specimen: STOOL  Result Value Ref Range Status   C Diff antigen NEGATIVE NEGATIVE Final   C Diff toxin NEGATIVE NEGATIVE Final   C Diff interpretation No C. difficile detected.   Final    Comment: Performed at [facility], [address] [place], [place] [address]      Radiology Studies: No results found.     LOS: 39 days   Asiah Browder [employer] on [email or web address]  07/24/2023, 9:43 AM

## 2023-07-24 NOTE — Progress Notes (Addendum)
Potassium this morning 3.1. Virgel Manifold, NP notified.

## 2023-07-24 NOTE — Progress Notes (Signed)
[facility] Gastroenterology Progress Note  Eric Zamora 77 y.o. [birth year]  CC: Diarrhea, colitis   Subjective: Patient seen and examined at bedside.  Patient was not able to provide any meaningful history regarding his diarrhea.  Discussed with RN.  Looks like stool output has decreased.  Current Flexi-Seal bag is half full and was not emptied since this morning.  ROS : Afebrile, negative for chest pain   Objective: Vital signs in last 24 hours: Vitals:   07/24/23 0000 07/24/23 0429  BP:  126/69  Pulse:  (!) 55  Resp: (!) 22 18  Temp:  98 F (36.7 C)  SpO2:  96%    Physical Exam: Resting comfortably, not in acute distress, abdominal exam benign.  Somewhat confused patient.  Lab Results: Recent Labs    07/21/23 2008 07/22/23 0415 07/23/23 0435 07/24/23 0329  NA  --    < > 139 140  K 2.6*   < > 2.9* 3.1*  CL  --    < > 110 112*  CO2  --    < > 21* 21*  GLUCOSE  --    < > 79 96  BUN  --    < > <5* <5*  CREATININE  --    < > 1.19 1.23  CALCIUM  --    < > 7.5* 7.7*  MG  --    < > 2.1 2.1  PHOS 1.7*  --   --   --    < > = values in this interval not displayed.   No results for input(s): "AST", "ALT", "ALKPHOS", "BILITOT", "PROT", "ALBUMIN" in the last 72 hours. Recent Labs    07/22/23 0415  WBC 6.1  HGB 8.7*  HCT 26.8*  MCV 88.4  PLT 239   No results for input(s): "LABPROT", "INR" in the last 72 hours.    Assessment/Plan: -Diarrhea with negative GI pathogen panel and C. difficile on this admission.  Diarrhea improved with cholestyramine and Imodium. -Normal CT scan concerning for colitis with thickening of the cecum and ascending colon wall with dilation of the transverse colon consistent with ileus. - CT scan earlier this month did not show any evidence of colitis and showed diffusely dilated colon concerning for ileus. -History of perirectal abscess status post drainage in October 2024. 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  Continue current management. -Continue Lovenox for now -Okay to DC antibiotics from GI standpoint -GI will follow  Kathi Der MD, FACP 07/24/2023, 1:23 PM  Contact #  [phone]

## 2023-07-25 DIAGNOSIS — K529 Noninfective gastroenteritis and colitis, unspecified: Secondary | ICD-10-CM | POA: Diagnosis not present

## 2023-07-25 DIAGNOSIS — E876 Hypokalemia: Secondary | ICD-10-CM | POA: Diagnosis not present

## 2023-07-25 DIAGNOSIS — I4891 Unspecified atrial fibrillation: Secondary | ICD-10-CM | POA: Diagnosis not present

## 2023-07-25 LAB — BASIC METABOLIC PANEL
Anion gap: 5 (ref 5–15)
BUN: <5 mg/dL — ABNORMAL LOW (ref 8–23)
CO2: 22 mmol/L (ref 22–32)
Calcium: 7.9 mg/dL — ABNORMAL LOW (ref 8.9–10.3)
Chloride: 110 mmol/L (ref 98–111)
Creatinine, Ser: 1.1 mg/dL (ref 0.61–1.24)
GFR, Estimated: >60 mL/min (ref >60)
Glucose, Bld: 67 mg/dL — ABNORMAL LOW (ref 70–99)
Potassium: 3.2 mmol/L — ABNORMAL LOW (ref 3.5–5.1)
Sodium: 137 mmol/L (ref 135–145)

## 2023-07-25 LAB — GLUCOSE, CAPILLARY
Glucose-Capillary: 57 mg/dL — ABNORMAL LOW (ref 70–99)
Glucose-Capillary: 87 mg/dL (ref 70–99)
Glucose-Capillary: 74 mg/dL (ref 70–99)
Glucose-Capillary: 92 mg/dL (ref 70–99)
Glucose-Capillary: 60 mg/dL — ABNORMAL LOW (ref 70–99)
Glucose-Capillary: 87 mg/dL (ref 70–99)
Glucose-Capillary: 120 mg/dL — ABNORMAL HIGH (ref 70–99)

## 2023-07-25 LAB — CBC
HCT: 28.9 % — ABNORMAL LOW (ref 39.0–52.0)
Hemoglobin: 9 g/dL — ABNORMAL LOW (ref 13.0–17.0)
MCH: 28.1 pg (ref 26.0–34.0)
MCHC: 31.1 g/dL (ref 30.0–36.0)
MCV: 90.3 fL (ref 80.0–100.0)
Platelets: 295 10*3/uL (ref 150–400)
RBC: 3.2 MIL/uL — ABNORMAL LOW (ref 4.22–5.81)
RDW: 15.2 % (ref 11.5–15.5)
WBC: 6.8 10*3/uL (ref 4.0–10.5)
nRBC: 0 % (ref 0.0–0.2)

## 2023-07-25 MED ORDER — POTASSIUM CHLORIDE CRYS ER 20 MEQ PO TBCR
40.0000 meq | EXTENDED_RELEASE_TABLET | ORAL | Status: AC
  Administered 2023-07-25 (×2): 40 meq via ORAL
  Filled 2023-07-25 (×2): qty 2

## 2023-07-25 MED ORDER — ENSURE ENLIVE PO LIQD
237.0000 mL | Freq: Three times a day (TID) | ORAL | Status: DC
  Administered 2023-07-25 – 2023-08-02 (×14): 237 mL via ORAL

## 2023-07-25 MED ORDER — POTASSIUM CHLORIDE CRYS ER 20 MEQ PO TBCR: 40.0000 meq | EXTENDED_RELEASE_TABLET | ORAL | Status: DC

## 2023-07-25 NOTE — Progress Notes (Signed)
[facility] Gastroenterology Progress Note  Eric Zamora 77 y.o. 1945-11-28  CC: Diarrhea, colitis   Subjective: Patient seen and examined at bedside.  Patient was not able to provide any meaningful history.  Continues to have diarrhea with loose stools in Flexi-Seal.  No blood in the stool.  ROS : Afebrile, negative for chest pain   Objective: Vital signs in last 24 hours: Vitals:   07/24/23 1959 07/25/23 0515  BP: 115/70 126/85  Pulse: 99 95  Resp: 19 19  Temp: 98 F (36.7 C) 98.7 F (37.1 C)  SpO2: 100% 100%    Physical Exam: Resting comfortably, not in acute distress, abdominal exam benign.  Somewhat confused patient.  Lab Results: Recent Labs    07/23/23 0435 07/24/23 0329 07/25/23 0332  NA 139 140 137  K 2.9* 3.1* 3.2*  CL 110 112* 110  CO2 21* 21* 22  GLUCOSE 79 96 67*  BUN <5* <5* <5*  CREATININE 1.19 1.23 1.10  CALCIUM 7.5* 7.7* 7.9*  MG 2.1 2.1  --    No results for input(s): "AST", "ALT", "ALKPHOS", "BILITOT", "PROT", "ALBUMIN" in the last 72 hours. Recent Labs    07/25/23 0332  WBC 6.8  HGB 9.0*  HCT 28.9*  MCV 90.3  PLT 295   No results for input(s): "LABPROT", "INR" in the last 72 hours.    Assessment/Plan: -Diarrhea with negative GI pathogen panel and C. difficile on this admission.  Diarrhea improved with cholestyramine and Imodium. -Normal CT scan concerning for colitis with thickening of the cecum and ascending colon wall with dilation of the transverse colon consistent with ileus. - CT scan earlier this month did not show any evidence of colitis and showed diffusely dilated colon concerning for ileus. -History of perirectal abscess status post drainage in October 2024. 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  Hopefully his diarrhea will improve off of antibiotics. -Continue  combination of cholestyramine 4 times a day and Imodium.   -If diarrhea persist, consider adding Lomotil  -Continue Lovenox for now -GI will follow  Kathi Der MD, FACP 07/25/2023, 10:15 AM  Contact #  [phone]

## 2023-07-25 NOTE — Progress Notes (Signed)
Mobility Specialist - Progress Note   07/25/23 1138  Mobility  Activity Dangled on edge of bed;Stood at bedside  Level of Assistance Moderate assist, patient does 50-74%  Assistive Device Front wheel walker  Range of Motion/Exercises Active  Activity Response Tolerated well  Mobility Referral Yes  $Mobility charge 1 Mobility  Mobility Specialist Start Time (ACUTE ONLY) 1105  Mobility Specialist Stop Time (ACUTE ONLY) 1117  Mobility Specialist Time Calculation (min) (ACUTE ONLY) 12 min   Received in bed and agreed to bed exercises. Mod A for sitting EOB.  Leg extensions x 5 each leg Marched in place seated  x 5 each leg Trunk flexion/extension when seated x 5.  Did all for 2 sets. Then stood up, Mod A held for 5 seconds, would pull up on walker rather than push through it. Educated and attempted again. Mod A and held for 15 seconds.  Left back in bed with all needs met, alarm on.  Marilynne Halsted Mobility Specialist

## 2023-07-25 NOTE — Progress Notes (Signed)
Pharmacy Antibiotic and Anticoagulation Note  Eric Zamora is a 77 y.o. male who is known to pharmacy from current lovenox consult. Pharmacy has been consulted on 07/16/23 to dose fluconazole for Oropharangeal candidiasis & consulted 11/17 to resume zosyn for IAI infection.  07/25/2023 - D#10 po fluconazole & Po nystatin for oral thrush - Zosyn discontinued 11/23, per GI, as likely not infectious etiology  Plan: - continues on fluconazole  200 mg PO daily thru 11/26  He continues on LMWH for afib.  Continue current dose of 80mg  q12h for now.  CBC stable, no bleeding reported  To transition back to Eliquis once colitis resolved ___________________________________________  Height: 5\' 5"  (165.1 cm) Weight: 79.9 kg (176 lb 2.4 oz) IBW/kg (Calculated) : 61.5  Temp (24hrs), Avg:98.5 F (36.9 C), Min:98 F (36.7 C), Max:98.7 F (37.1 C)  Recent Labs  Lab 07/19/23 0514 07/20/23 0210 07/20/23 1105 07/21/23 0054 07/22/23 0415 07/23/23 0435 07/24/23 0329 07/25/23 0332  WBC 5.0 5.5  --  5.7 6.1  --   --  6.8  CREATININE 1.08 1.09   < > 1.07 1.15 1.19 1.23 1.10   < > = values in this interval not displayed.    Estimated Creatinine Clearance: 53.9 mL/min (by C-G formula based on SCr of 1.1 mg/dL).    No Known Allergies  Antimicrobials this admission: 10/15 Cefepime >> 10/21 10/15 Flagyl >> 10/21  10/15 Vancomycin >> 10/21 10/21 Doxy >> 11/3 10/21 Augmentin >> 11/3 11/1 Fluconazole >> 11/7, resumed 11/15 >> (11/26) 11/15 po nystatin>>  11/17 Zosyn >> 11/23   Microbiology results: No new cultures in past 30 days 10/15 BCx: ngF 10/15 MRSA PCR: positive 10/16 resp panel neg 10/17 BCx2: NGF  11/18 GI panel: neg 11/18 C diff: neg     Thank you for allowing pharmacy to be a part of this patient's care.  Cherylin Mylar, PharmD Clinical Pharmacist  11/24/20248:31 AM

## 2023-07-25 NOTE — Progress Notes (Signed)
Hypoglycemic Event  CBG: 57  Treatment: 4 oz juice/soda  Symptoms: None  Follow-up CBG: Time:0512 CBG Result:87  Possible Reasons for Event: Unknown  Comments/MD notified:James Marlena Clipper, NP    Ardeen Fillers  Elpidio Galea

## 2023-07-25 NOTE — Progress Notes (Signed)
  Daily Progress Note   Patient Name: Eric Zamora       Date: 07/25/2023 DOB: 1946/03/30  Age: 77 y.o. MRN#: [id number] Attending Physician: Osvaldo Shipper, MD Primary Care Physician: Uvaldo Bristle, PA-C Admit Date: 06/15/2023 Length of Stay: 40 days  Palliative medicine team continues to follow along peripherally with patient's medical journey. GI has been assisting with diarrhea management. Patient still participating with therapy as able as plan is for rehab when medical appropraite. Have already recommended PMT follow up at SNF. Patient already DNR/DNI as per two physician paperwork which was previously completed. Patient has legal guardian in place. If acute PMT needs arise, please reach out. Thank you.   Alvester Morin, DO Palliative Care Provider PMT # [phone]

## 2023-07-25 NOTE — Plan of Care (Signed)
  Problem: Education: Goal: Ability to describe self-care measures that may prevent or decrease complications (Diabetes Survival Skills Education) will improve Outcome: Progressing   Problem: Nutrition: Goal: Adequate nutrition will be maintained Outcome: Progressing   Problem: Clinical Measurements: Goal: Diagnostic test results will improve Outcome: Progressing

## 2023-07-25 NOTE — Progress Notes (Addendum)
[facility] PROGRESS NOTE   Eric Zamora ION:[id number] DOB: [birth year] DOA: 06/15/2023  PCP: Uvaldo Bristle, PA-C  Brief History: 77 y.o. 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, CAD, dCHF, HTN, hx DVT no longer on [facility], and HLD who admitted on 10/15 for severe sepsis due to perirectal abscess.   10/15 admitted to stepdown unit. 10/17: Overnight with fever, aspiration event, new AG acidosis, transferred back to SDU for BiPAP and insulin drip 10/18: taken to OR for drainage of abscess 10/23 cardiology was consulted for new onset A-fib with RVR. 10/24 nephrology was consulted for AKI and hyponatremia.  CT head was obtained for ongoing AMS and was found to have acute/subacute right MCA territory infarct.  Neurology was also consulted. 10/25.  Palliative care was consulted. 10/31.  Anticoagulation started again. 11/1.  Second dose of Bicillin given.  Underwent MBS. 11/2.  Marinol started for poor p.o. intake. 11/4.  Found to have ileus.  GI consulted.  Recommended DNR to legal guardian. 11/5.  Two-physician DNR form was signed and sent to guardian.  Will await decision. Subsequently developed colitis.  Significant diarrhea noted.  GI was consulted.  C. difficile and GI pathogen panel testing were negative.  Consultants: Gastroenterology.  General surgery.  Procedures: Drainage of rectal abscess    Subjective/Interval History: No complaints offered.  Specifically denies any abdominal pain nausea or vomiting.  Diarrhea persists.    Assessment/Plan:  Perirectal abscess with severe sepsis, present on admission Sepsis physiology improved.   Patient was seen by general surgery and underwent drainage of the abscess on 06/18/2023. Patient was on broad-spectrum antibiotics initially which was transitioned to cefepime and then to Augmentin and doxycycline.  2-week course was recommended from initial I&D.  Has completed course  of antibiotics.  Colitis Gastroenterology is following.  Patient was started on Zosyn.  Discontinued yesterday. Continues to have diarrhea though it appears to have slowed down. Stool studies were negative. Patient was started on cholestyramine and then on Imodium.  Continue to monitor.    New onset atrial fibrillation with RVR Seen by cardiology.  On metoprolol.  Anticoagulation was with Eliquis but currently on Lovenox due to concern for ileus. May resume apixaban once his GI issues have stabilized.  Acute metabolic encephalopathy/history of dementia/acute right MCA stroke/oropharyngeal dysphagia CT head on 06/20/2023 showed acute versus subacute stroke.  MRI brain showed acute stroke.  MRA did not show any acute abnormalities.  Carotid Dopplers did not show any significant stenosis.  EEG did not show any seizures.  Seen by neurology.  They recommended resumption of anticoagulation. Seen by speech therapy as well.  Currently on dysphagia 1 diet with nectar thick liquids. Mentation seems to be close to baseline now.  History of seizures EEG negative for seizure activity Noted to be on Keppra.    Multiple electrolyte abnormalities including severe hypokalemia Secondary to GI loss.  Continue to supplement potassium.  Magnesium is 2.1.    Normocytic anemia Stable hemoglobin for the most part.  Acute kidney injury on chronic kidney disease stage IIIb/none anion gap metabolic acidosis Renal function has recovered.  Monitor urine output.  Avoid nephrotoxic agents.  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 Previous rounding physician discussed with ID and apparently patient is being treated with Bicillin on a weekly basis.  Fluid in the right inguinal canal This was noted on recent CT scan as well as scrotal ultrasound.  Significance  of this is unclear.  Would recommend that the [place] be repeated in 1 to 2 months.  History of coronary artery disease Stable.  Bipolar  disorder Stable.  Diabetes mellitus type 2, uncontrolled with hyperglycemia Low glucose levels noted.  Likely due to poor oral intake.  Will discontinue insulin.  Not noted to be on any SSI.  Continue to monitor CBGs for now.  Oral thrush On Diflucan.  Goals of care Patient has a legal guardian.  Discussions were held with legal guardian regarding his poor prognosis.  Palliative care was also consulted.  Patient transitioned to DNR.  DVT Prophylaxis: On Lovenox Code Status: DNR Family Communication: No family at bedside Disposition Plan: SNF when medically stable     Medications: Scheduled:  Chlorhexidine Gluconate Cloth  6 each Topical Daily   cholestyramine light  4 g Oral QID   enoxaparin (LOVENOX) injection  80 mg Subcutaneous Q12H   feeding supplement  237 mL Oral TID BM   fluconazole  200 mg Oral Daily   Gerhardt's butt cream   Topical BID   insulin glargine-yfgn  8 Units Subcutaneous QHS   levETIRAcetam  500 mg Oral BID   loperamide  2 mg Oral Daily   magic mouthwash  10 mL Oral QID   metoprolol tartrate  25 mg Oral BID   nystatin  5 mL Oral QID   mouth rinse  15 mL Mouth Rinse 4 times per day   pantoprazole  40 mg Oral Daily   PARoxetine  10 mg Oral Daily   potassium chloride  40 mEq Oral Q4H   simethicone  40 mg Oral QID   simvastatin  20 mg Oral QHS   sodium bicarbonate  650 mg Oral BID   sodium chloride flush  3 mL Intravenous Q12H   Continuous:   LKG:[id number], albuterol, HYDROmorphone (DILAUDID) injection, iohexol, ondansetron **OR** ondansetron (ZOFRAN) IV, mouth rinse, sodium chloride flush  Antibiotics: Anti-infectives (From admission, onward)    Start     Dose/Rate Route Frequency Ordered Stop   07/20/23 1000  fluconazole (DIFLUCAN) tablet 200 mg        200 mg Oral Daily 07/19/23 1145 07/27/23 0959   07/19/23 1300  fluconazole (DIFLUCAN) tablet 100 mg        100 mg Oral  Once 07/19/23 1145 07/19/23 1303   07/18/23 1830   piperacillin-tazobactam (ZOSYN) IVPB 3.375 g  Status:  Discontinued        3.375 g 12.5 mL/hr over 240 Minutes Intravenous Every 8 hours 07/18/23 1808 07/24/23 1427   07/16/23 1300  fluconazole (DIFLUCAN) tablet 100 mg  Status:  Discontinued        100 mg Oral Daily 07/16/23 1154 07/19/23 1145   07/05/23 1600  fluconazole (DIFLUCAN) IVPB 200 mg        200 mg 100 mL/hr over 60 Minutes Intravenous Every 24 hours 07/05/23 1320 07/08/23 1658   07/02/23 1000  fluconazole (DIFLUCAN) tablet 100 mg  Status:  Discontinued        100 mg Oral Daily 07/02/23 0802 07/05/23 1320   07/02/23 0800  penicillin g benzathine (BICILLIN LA) 1200000 UNIT/2ML injection 2.4 Million Units        2.4 Million Units Intramuscular Weekly 07/01/23 1209 07/09/23 0950   06/26/23 1500  penicillin g benzathine (BICILLIN LA) 1200000 UNIT/2ML injection 2.4 Million Units        2.4 Million Units Intramuscular  Once 06/26/23 1351 06/28/23 0018   06/24/23 2200  amoxicillin-clavulanate (AUGMENTIN) 500-125 MG  per tablet 1 tablet  Status:  Discontinued        1 tablet Oral Every 12 hours 06/24/23 1450 07/03/23 1202   06/24/23 2200  amoxicillin-clavulanate (AUGMENTIN) 500-125 MG per tablet 1 tablet  Status:  Discontinued        1 tablet Oral 2 times daily 06/24/23 1451 06/24/23 1451   06/21/23 1215  doxycycline (VIBRA-TABS) tablet 100 mg  Status:  Discontinued        100 mg Oral Every 12 hours 06/21/23 1128 07/03/23 1202   06/21/23 1215  amoxicillin-clavulanate (AUGMENTIN) 875-125 MG per tablet 1 tablet  Status:  Discontinued        1 tablet Oral Every 12 hours 06/21/23 1128 06/24/23 1450   06/20/23 1200  vancomycin (VANCOREADY) IVPB 1500 mg/300 mL  Status:  Discontinued        1,500 mg 150 mL/hr over 120 Minutes Intravenous Every 36 hours 06/20/23 0719 06/21/23 1128   06/19/23 0000  vancomycin (VANCOREADY) IVPB 1250 mg/250 mL  Status:  Discontinued        1,250 mg 166.7 mL/hr over 90 Minutes Intravenous Every 36 hours 06/18/23 0939  06/20/23 0719   06/18/23 1800  ceFEPIme (MAXIPIME) 2 g in sodium chloride 0.9 % 100 mL IVPB  Status:  Discontinued        2 g 200 mL/hr over 30 Minutes Intravenous Every 12 hours 06/18/23 0939 06/21/23 1128   06/18/23 0600  ceFEPIme (MAXIPIME) 2 g in sodium chloride 0.9 % 100 mL IVPB  Status:  Discontinued        2 g 200 mL/hr over 30 Minutes Intravenous Every 24 hours 06/17/23 0920 06/18/23 0939   06/17/23 1400  vancomycin (VANCOCIN) IVPB 1000 mg/200 mL premix  Status:  Discontinued        1,000 mg 200 mL/hr over 60 Minutes Intravenous Every 36 hours 06/17/23 0920 06/18/23 0939   06/16/23 1800  vancomycin (VANCOREADY) IVPB 750 mg/150 mL  Status:  Discontinued        750 mg 150 mL/hr over 60 Minutes Intravenous Every 24 hours 06/15/23 1639 06/17/23 0920   06/16/23 1100  cefTRIAXone (ROCEPHIN) 2 g in sodium chloride 0.9 % 100 mL IVPB  Status:  Discontinued        2 g 200 mL/hr over 30 Minutes Intravenous Every 24 hours 06/15/23 1453 06/15/23 1455   06/15/23 1800  ceFEPIme (MAXIPIME) 2 g in sodium chloride 0.9 % 100 mL IVPB  Status:  Discontinued        2 g 200 mL/hr over 30 Minutes Intravenous Every 12 hours 06/15/23 1558 06/17/23 0920   06/15/23 1700  metroNIDAZOLE (FLAGYL) IVPB 500 mg  Status:  Discontinued        500 mg 100 mL/hr over 60 Minutes Intravenous Every 12 hours 06/15/23 1455 06/21/23 1128   06/15/23 1630  vancomycin (VANCOREADY) IVPB 1500 mg/300 mL        1,500 mg 150 mL/hr over 120 Minutes Intravenous  Once 06/15/23 1558 06/15/23 2058   06/15/23 1045  cefTRIAXone (ROCEPHIN) 2 g in sodium chloride 0.9 % 100 mL IVPB        2 g 200 mL/hr over 30 Minutes Intravenous  Once 06/15/23 1041 06/15/23 1155       Objective:  Vital Signs  Vitals:   07/24/23 1338 07/24/23 1959 07/25/23 0500 07/25/23 0515  BP: (!) 145/83 115/70  126/85  Pulse: 93 99  95  Resp: 18 19  19   Temp: 98.7 F (37.1 C) 98  F (36.7 C)  98.7 F (37.1 C)  TempSrc: Oral Oral  Oral  SpO2: (!) 80% 100%   100%  Weight:   79.9 kg   Height:        Intake/Output Summary (Last 24 hours) at 07/25/2023 0918 Last data filed at 07/25/2023 0850 Gross per 24 hour  Intake 150 ml  Output 1400 ml  Net -1250 ml   Filed Weights   07/23/23 0500 07/24/23 0500 07/25/23 0500  Weight: 81 kg 75.6 kg 79.9 kg    General appearance: Awake alert.  In no distress Resp: Clear to auscultation bilaterally.  Normal effort Cardio: S1-S2 is normal regular.  No S3-S4.  No rubs murmurs or bruit GI: Abdomen is soft.  Nontender nondistended.  Bowel sounds are present normal.  No masses organomegaly Physical deconditioning is noted.   Lab Results:  Data Reviewed: I have personally reviewed following labs and reports of the imaging studies  CBC: Recent Labs  Lab 07/19/23 0514 07/20/23 0210 07/21/23 0054 07/22/23 0415 07/25/23 0332  WBC 5.0 5.5 5.7 6.1 6.8  NEUTROABS 3.1 3.0 3.0  --   --   HGB 8.9* 8.7* 8.7* 8.7* 9.0*  HCT 27.8* 26.6* 26.7* 26.8* 28.9*  MCV 88.0 88.7 88.4 88.4 90.3  PLT 227 218 276 239 295    Basic Metabolic Panel: Recent Labs  Lab 07/20/23 0210 07/20/23 1105 07/20/23 2113 07/21/23 0054 07/21/23 2008 07/22/23 0415 07/22/23 1824 07/23/23 0435 07/24/23 0329 07/25/23 0332  NA 137 138 138 135  --  137  --  139 140 137  K 3.1* 5.3* 2.7* 2.6* 2.6* 3.2* 3.3* 2.9* 3.1* 3.2*  CL 111 112* 109 108  --  112*  --  110 112* 110  CO2 19* 20* 20* 21*  --  21*  --  21* 21* 22  GLUCOSE 92 88 114* 95  --  88  --  79 96 67*  BUN <5* <5* <5* <5*  --  <5*  --  <5* <5* <5*  CREATININE 1.09 1.09 1.08 1.07  --  1.15  --  1.19 1.23 1.10  CALCIUM 7.1* 7.2* 7.5* 7.3*  --  7.4*  --  7.5* 7.7* 7.9*  MG 2.0  --  2.0 2.0  --  1.9  --  2.1 2.1  --   PHOS 1.9* 2.1* 2.7 2.4* 1.7*  --   --   --   --   --     GFR: Estimated Creatinine Clearance: 53.9 mL/min (by C-G formula based on SCr of 1.1 mg/dL).  Liver Function Tests: Recent Labs  Lab 07/19/23 0514 07/20/23 0210 07/21/23 0054  AST 33 29 26   ALT 27 24 22   ALKPHOS 25* 25* 26*  BILITOT 0.7 0.5 0.6  PROT 6.0* 5.9* 6.2*  ALBUMIN 2.6* 2.5* 2.6*     CBG: Recent Labs  Lab 07/24/23 2015 07/24/23 2351 07/25/23 0429 07/25/23 0512 07/25/23 0731  GLUCAP 130* 114* 57* 87 74     Recent Results (from the past 240 hour(s))  Gastrointestinal Panel by PCR , Stool     Status: None   Collection Time: 07/19/23 11:40 AM   Specimen: STOOL  Result Value Ref Range Status   Campylobacter species NOT DETECTED NOT DETECTED Final   Plesimonas shigelloides NOT DETECTED NOT DETECTED Final   Salmonella species NOT DETECTED NOT DETECTED Final   Yersinia enterocolitica NOT DETECTED NOT DETECTED Final   Vibrio species NOT DETECTED NOT DETECTED Final   Vibrio cholerae NOT DETECTED  NOT DETECTED Final   Enteroaggregative E coli (EAEC) NOT DETECTED NOT DETECTED Final   Enteropathogenic E coli (EPEC) NOT DETECTED NOT DETECTED Final   Enterotoxigenic E coli (ETEC) NOT DETECTED NOT DETECTED Final   Shiga like toxin producing E coli (STEC) NOT DETECTED NOT DETECTED Final   Shigella/Enteroinvasive E coli (EIEC) NOT DETECTED NOT DETECTED Final   Cryptosporidium NOT DETECTED NOT DETECTED Final   Cyclospora cayetanensis NOT DETECTED NOT DETECTED Final   Entamoeba histolytica NOT DETECTED NOT DETECTED Final   Giardia lamblia NOT DETECTED NOT DETECTED Final   Adenovirus F40/41 NOT DETECTED NOT DETECTED Final   Astrovirus NOT DETECTED NOT DETECTED Final   Norovirus GI/GII NOT DETECTED NOT DETECTED Final   Rotavirus A NOT DETECTED NOT DETECTED Final   Sapovirus (I, II, IV, and V) NOT DETECTED NOT DETECTED Final    Comment: Performed at [facility], [address] [place], [place] [address]  C Difficile Quick Screen (NO PCR Reflex)     Status: None   Collection Time: 07/19/23 11:40 AM   Specimen: STOOL  Result Value Ref Range Status   C Diff antigen NEGATIVE NEGATIVE Final   C Diff toxin NEGATIVE NEGATIVE Final   C Diff interpretation No  C. difficile detected.  Final    Comment: Performed at [facility], [address], [place], [place] [address]      Radiology Studies: No results found.     LOS: 40 days   Shevelle Smither [employer] on [email or web address]  07/25/2023, 9:18 AM

## 2023-07-26 DIAGNOSIS — E876 Hypokalemia: Secondary | ICD-10-CM | POA: Diagnosis not present

## 2023-07-26 DIAGNOSIS — K529 Noninfective gastroenteritis and colitis, unspecified: Secondary | ICD-10-CM | POA: Diagnosis not present

## 2023-07-26 DIAGNOSIS — I4891 Unspecified atrial fibrillation: Secondary | ICD-10-CM | POA: Diagnosis not present

## 2023-07-26 LAB — GLUCOSE, CAPILLARY
Glucose-Capillary: 83 mg/dL (ref 70–99)
Glucose-Capillary: 114 mg/dL — ABNORMAL HIGH (ref 70–99)
Glucose-Capillary: 117 mg/dL — ABNORMAL HIGH (ref 70–99)
Glucose-Capillary: 84 mg/dL (ref 70–99)

## 2023-07-26 LAB — CBC
HCT: 30.4 % — ABNORMAL LOW (ref 39.0–52.0)
Hemoglobin: 9.5 g/dL — ABNORMAL LOW (ref 13.0–17.0)
MCH: 28.3 pg (ref 26.0–34.0)
MCHC: 31.3 g/dL (ref 30.0–36.0)
MCV: 90.5 fL (ref 80.0–100.0)
Platelets: 324 10*3/uL (ref 150–400)
RBC: 3.36 MIL/uL — ABNORMAL LOW (ref 4.22–5.81)
RDW: 15.1 % (ref 11.5–15.5)
WBC: 6.3 10*3/uL (ref 4.0–10.5)
nRBC: 0 % (ref 0.0–0.2)

## 2023-07-26 LAB — BASIC METABOLIC PANEL
Anion gap: 6 (ref 5–15)
BUN: <5 mg/dL — ABNORMAL LOW (ref 8–23)
CO2: 22 mmol/L (ref 22–32)
Calcium: 8.1 mg/dL — ABNORMAL LOW (ref 8.9–10.3)
Chloride: 111 mmol/L (ref 98–111)
Creatinine, Ser: 1.08 mg/dL (ref 0.61–1.24)
GFR, Estimated: >60 mL/min (ref >60)
Glucose, Bld: 100 mg/dL — ABNORMAL HIGH (ref 70–99)
Potassium: 3.3 mmol/L — ABNORMAL LOW (ref 3.5–5.1)
Sodium: 139 mmol/L (ref 135–145)

## 2023-07-26 LAB — HEMOGLOBIN AND HEMATOCRIT, BLOOD
HCT: 29.4 % — ABNORMAL LOW (ref 39.0–52.0)
Hemoglobin: 9.4 g/dL — ABNORMAL LOW (ref 13.0–17.0)

## 2023-07-26 LAB — MAGNESIUM: Magnesium: 2.1 mg/dL (ref 1.7–2.4)

## 2023-07-26 MED ORDER — POTASSIUM CHLORIDE CRYS ER 20 MEQ PO TBCR
40.0000 meq | EXTENDED_RELEASE_TABLET | ORAL | Status: AC
  Administered 2023-07-26 (×2): 40 meq via ORAL
  Filled 2023-07-26 (×2): qty 2

## 2023-07-26 MED ORDER — ENOXAPARIN SODIUM 80 MG/0.8ML IJ SOSY
80.0000 mg | PREFILLED_SYRINGE | Freq: Two times a day (BID) | INTRAMUSCULAR | Status: DC
  Administered 2023-07-27 – 2023-07-28 (×2): 80 mg via SUBCUTANEOUS
  Filled 2023-07-26 (×2): qty 0.8

## 2023-07-26 NOTE — Progress Notes (Signed)
Red flank blood noted in rectal tube and some leakage around rectal tube at the anus. Patient was asymptomatic and reported no discomfort. Chinita Greenland, NP and CN Tom notified.   See chart for vitals.

## 2023-07-26 NOTE — Plan of Care (Signed)
Problem: Education: Goal: Ability to describe self-care measures that may prevent or decrease complications (Diabetes Survival Skills Education) will improve Outcome: Progressing Goal: Individualized Educational Video(s) Outcome: Progressing   Problem: Coping: Goal: Ability to adjust to condition or change in health will improve Outcome: Progressing   Problem: Fluid Volume: Goal: Ability to maintain a balanced intake and output will improve Outcome: Progressing   Problem: Health Behavior/Discharge Planning: Goal: Ability to identify and utilize available resources and services will improve Outcome: Progressing Goal: Ability to manage health-related needs will improve Outcome: Progressing   Problem: Metabolic: Goal: Ability to maintain appropriate glucose levels will improve Outcome: Progressing   Problem: Nutritional: Goal: Maintenance of adequate nutrition will improve Outcome: Progressing Goal: Progress toward achieving an optimal weight will improve Outcome: Progressing   Problem: Skin Integrity: Goal: Risk for impaired skin integrity will decrease Outcome: Progressing   Problem: Tissue Perfusion: Goal: Adequacy of tissue perfusion will improve Outcome: Progressing   Problem: Education: Goal: Knowledge of General Education information will improve Description: Including pain rating scale, medication(s)/side effects and non-pharmacologic comfort measures Outcome: Progressing   Problem: Health Behavior/Discharge Planning: Goal: Ability to manage health-related needs will improve Outcome: Progressing   Problem: Clinical Measurements: Goal: Ability to maintain clinical measurements within normal limits will improve Outcome: Progressing Goal: Will remain free from infection Outcome: Progressing Goal: Diagnostic test results will improve Outcome: Progressing Goal: Respiratory complications will improve Outcome: Progressing Goal: Cardiovascular complication will  be avoided Outcome: Progressing   Problem: Activity: Goal: Risk for activity intolerance will decrease Outcome: Progressing   Problem: Nutrition: Goal: Adequate nutrition will be maintained Outcome: Progressing   Problem: Coping: Goal: Level of anxiety will decrease Outcome: Progressing   Problem: Elimination: Goal: Will not experience complications related to bowel motility Outcome: Progressing Goal: Will not experience complications related to urinary retention Outcome: Progressing   Problem: Pain Managment: Goal: General experience of comfort will improve Outcome: Progressing   Problem: Safety: Goal: Ability to remain free from injury will improve Outcome: Progressing   Problem: Skin Integrity: Goal: Risk for impaired skin integrity will decrease Outcome: Progressing   Problem: Fluid Volume: Goal: Hemodynamic stability will improve Outcome: Progressing   Problem: Clinical Measurements: Goal: Diagnostic test results will improve Outcome: Progressing Goal: Signs and symptoms of infection will decrease Outcome: Progressing   Problem: Respiratory: Goal: Ability to maintain adequate ventilation will improve Outcome: Progressing   Problem: Education: Goal: Ability to describe self-care measures that may prevent or decrease complications (Diabetes Survival Skills Education) will improve Outcome: Progressing Goal: Individualized Educational Video(s) Outcome: Progressing   Problem: Cardiac: Goal: Ability to maintain an adequate cardiac output will improve Outcome: Progressing   Problem: Health Behavior/Discharge Planning: Goal: Ability to identify and utilize available resources and services will improve Outcome: Progressing Goal: Ability to manage health-related needs will improve Outcome: Progressing   Problem: Fluid Volume: Goal: Ability to achieve a balanced intake and output will improve Outcome: Progressing   Problem: Metabolic: Goal: Ability to  maintain appropriate glucose levels will improve Outcome: Progressing   Problem: Nutritional: Goal: Maintenance of adequate nutrition will improve Outcome: Progressing Goal: Maintenance of adequate weight for body size and type will improve Outcome: Progressing   Problem: Respiratory: Goal: Will regain and/or maintain adequate ventilation Outcome: Progressing   Problem: Urinary Elimination: Goal: Ability to achieve and maintain adequate renal perfusion and functioning will improve Outcome: Progressing   Problem: Education: Goal: Knowledge of disease or condition will improve Outcome: Progressing Goal: Knowledge of secondary prevention will improve (  MUST DOCUMENT ALL) Outcome: Progressing Goal: Knowledge of patient specific risk factors will improve Loraine Leriche N/A or DELETE if not current risk factor) Outcome: Progressing

## 2023-07-26 NOTE — Progress Notes (Signed)
Aceyn [employer] 3:23 PM  Subjective: Patient seen and examined and case discussed with my partner Dr. Levora Angel and he is doing about the same as he was earlier in the month when I saw him for a colonic ileus and it does not sound like his situation has changed although he has had some increased diarrhea which seems to be better on his current regiment even though they may have seen a little blood in his back this morning and he himself has no new complaints  Objective: Vital signs stable afebrile no acute distress abdomen is soft nontender potassium still too low hemoglobin actually increased over his baseline reported stool output a little less  Assessment: Multiple medical problems  Plan: Although a flex sig might be indicated I am not sure what we would do differently based on the findings and since he seems to be fairly stable would just treat him medically for now and please call me if I can be of any further assistance with this hospital stay [facility]  office [phone] After 5PM or if no answer call [phone]

## 2023-07-26 NOTE — Progress Notes (Addendum)
[facility] PROGRESS NOTE   Eric Zamora NWG:[id number] DOB: 06/17/1946 DOA: 06/15/2023  PCP: Uvaldo Bristle, PA-C  Brief History: 77 y.o. 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, CAD, dCHF, HTN, hx DVT no longer on [facility], and HLD who admitted on 10/15 for severe sepsis due to perirectal abscess.   10/15 admitted to stepdown unit. 10/17: Overnight with fever, aspiration event, new AG acidosis, transferred back to SDU for BiPAP and insulin drip 10/18: taken to OR for drainage of abscess 10/23 cardiology was consulted for new onset A-fib with RVR. 10/24 nephrology was consulted for AKI and hyponatremia.  CT head was obtained for ongoing AMS and was found to have acute/subacute right MCA territory infarct.  Neurology was also consulted. 10/25.  Palliative care was consulted. 10/31.  Anticoagulation started again. 11/1.  Second dose of Bicillin given.  Underwent MBS. 11/2.  Marinol started for poor p.o. intake. 11/4.  Found to have ileus.  GI consulted.  Recommended DNR to legal guardian. 11/5.  Two-physician DNR form was signed and sent to guardian.  Will await decision. Subsequently developed colitis.  Significant diarrhea noted.  GI was consulted.  C. difficile and GI pathogen panel testing were negative.  Consultants: Gastroenterology.  General surgery.  Procedures: Drainage of rectal abscess    Subjective/Interval History: Blood in the rectal tube was noted overnight.  Patient denies any abdominal pain nausea or vomiting.  .    Assessment/Plan:  Perirectal abscess with severe sepsis, present on admission Sepsis physiology improved.   Patient was seen by general surgery and underwent drainage of the abscess on 06/18/2023. Patient was on broad-spectrum antibiotics initially which was transitioned to cefepime and then to Augmentin and doxycycline.  2-week course was recommended from initial I&D.  Has completed course  of antibiotics.  Presumed colitis/rectal bleeding Patient was started on Zosyn.  Gastroenterology was consulted due to profuse diarrhea.  Stool studies were negative. Patient was started on cholestyramine and then Imodium.  Zosyn was discontinued.  Diarrhea had slowed down. Overnight he is noted to have blood in the rectal tube.  Examination this morning shows some mild bleeding around the rectal tube. He could be having mucosal irritation from the tube itself.  Will discontinue the tube.  Hemoglobin is stable this morning.  Will hold anticoagulation for a day.    New onset atrial fibrillation with RVR Seen by cardiology.  On metoprolol.  Oral anticoagulation was held due to concern for ileus.  Placed on Lovenox.  Now with rectal bleeding.  Will hold Lovenox for today.   May resume apixaban once his GI issues have stabilized.  Multiple electrolyte abnormalities including severe hypokalemia Secondary to GI loss.  Continue to supplement potassium.  Magnesium is 2.1.    Acute metabolic encephalopathy/history of dementia/acute right MCA stroke/oropharyngeal dysphagia CT head on 06/20/2023 showed acute versus subacute stroke.  MRI brain showed acute stroke.  MRA did not show any acute abnormalities.  Carotid Dopplers did not show any significant stenosis.  EEG did not show any seizures.  Seen by neurology.  They recommended resumption of anticoagulation. Seen by speech therapy as well.  Currently on dysphagia 1 diet with nectar thick liquids. Mentation seems to be close to baseline now.  History of seizures EEG negative for seizure activity Noted to be on Keppra.    Normocytic anemia Stable hemoglobin for the most part.  Globin stable this morning.  Recheck labs later today.  Acute kidney injury on chronic kidney disease stage IIIb/none anion gap metabolic acidosis Renal function has recovered.  Monitor urine output.  Avoid nephrotoxic agents.  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 Previous rounding physician discussed with ID and apparently patient is being treated with Bicillin on a weekly basis.  Fluid in the right inguinal canal This was noted on recent CT scan as well as scrotal ultrasound.  Significance of this is unclear.  Would recommend that the [place] be repeated in 1 to 2 months.  History of coronary artery disease Stable.  Bipolar disorder Stable.  Diabetes mellitus type 2, uncontrolled with hyperglycemia Noted to have hypoglycemic episodes his insulin was discontinued.  Monitor CBGs.  Encourage oral intake.  HbA1c was 9.4 in October.  Oral thrush Will complete course of Diflucan today.  Goals of care Patient has a legal guardian.  Discussions were held with legal guardian regarding his poor prognosis.  Palliative care was also consulted.  Patient transitioned to DNR.  DVT Prophylaxis: On Lovenox Code Status: DNR Family Communication: No family at bedside Disposition Plan: SNF when medically stable     Medications: Scheduled:  Chlorhexidine Gluconate Cloth  6 each Topical Daily   cholestyramine light  4 g Oral QID   [START ON 07/27/2023] enoxaparin (LOVENOX) injection  80 mg Subcutaneous Q12H   feeding supplement  237 mL Oral TID BM   fluconazole  200 mg Oral Daily   Gerhardt's butt cream   Topical BID   levETIRAcetam  500 mg Oral BID   loperamide  2 mg Oral Daily   magic mouthwash  10 mL Oral QID   metoprolol tartrate  25 mg Oral BID   nystatin  5 mL Oral QID   mouth rinse  15 mL Mouth Rinse 4 times per day   pantoprazole  40 mg Oral Daily   PARoxetine  10 mg Oral Daily   potassium chloride  40 mEq Oral Q4H   simethicone  40 mg Oral QID   simvastatin  20 mg Oral QHS   sodium bicarbonate  650 mg Oral BID   sodium chloride flush  3 mL Intravenous Q12H   Continuous:   [id number], albuterol, HYDROmorphone (DILAUDID) injection, iohexol, ondansetron **OR** ondansetron (ZOFRAN) IV, mouth rinse, sodium chloride  flush  Antibiotics: Anti-infectives (From admission, onward)    Start     Dose/Rate Route Frequency Ordered Stop   07/20/23 1000  fluconazole (DIFLUCAN) tablet 200 mg        200 mg Oral Daily 07/19/23 1145 07/27/23 0959   07/19/23 1300  fluconazole (DIFLUCAN) tablet 100 mg        100 mg Oral  Once 07/19/23 1145 07/19/23 1303   07/18/23 1830  piperacillin-tazobactam (ZOSYN) IVPB 3.375 g  Status:  Discontinued        3.375 g 12.5 mL/hr over 240 Minutes Intravenous Every 8 hours 07/18/23 1808 07/24/23 1427   07/16/23 1300  fluconazole (DIFLUCAN) tablet 100 mg  Status:  Discontinued        100 mg Oral Daily 07/16/23 1154 07/19/23 1145   07/05/23 1600  fluconazole (DIFLUCAN) IVPB 200 mg        200 mg 100 mL/hr over 60 Minutes Intravenous Every 24 hours 07/05/23 1320 07/08/23 1658   07/02/23 1000  fluconazole (DIFLUCAN) tablet 100 mg  Status:  Discontinued        100 mg Oral Daily 07/02/23 0802 07/05/23 1320   07/02/23 0800  penicillin g benzathine (BICILLIN  LA) 1200000 UNIT/2ML injection 2.4 Million Units        2.4 Million Units Intramuscular Weekly 07/01/23 1209 07/09/23 0950   06/26/23 1500  penicillin g benzathine (BICILLIN LA) 1200000 UNIT/2ML injection 2.4 Million Units        2.4 Million Units Intramuscular  Once 06/26/23 1351 06/28/23 0018   06/24/23 2200  amoxicillin-clavulanate (AUGMENTIN) 500-125 MG per tablet 1 tablet  Status:  Discontinued        1 tablet Oral Every 12 hours 06/24/23 1450 07/03/23 1202   06/24/23 2200  amoxicillin-clavulanate (AUGMENTIN) 500-125 MG per tablet 1 tablet  Status:  Discontinued        1 tablet Oral 2 times daily 06/24/23 1451 06/24/23 1451   06/21/23 1215  doxycycline (VIBRA-TABS) tablet 100 mg  Status:  Discontinued        100 mg Oral Every 12 hours 06/21/23 1128 07/03/23 1202   06/21/23 1215  amoxicillin-clavulanate (AUGMENTIN) 875-125 MG per tablet 1 tablet  Status:  Discontinued        1 tablet Oral Every 12 hours 06/21/23 1128 06/24/23 1450    06/20/23 1200  vancomycin (VANCOREADY) IVPB 1500 mg/300 mL  Status:  Discontinued        1,500 mg 150 mL/hr over 120 Minutes Intravenous Every 36 hours 06/20/23 0719 06/21/23 1128   06/19/23 0000  vancomycin (VANCOREADY) IVPB 1250 mg/250 mL  Status:  Discontinued        1,250 mg 166.7 mL/hr over 90 Minutes Intravenous Every 36 hours 06/18/23 0939 06/20/23 0719   06/18/23 1800  ceFEPIme (MAXIPIME) 2 g in sodium chloride 0.9 % 100 mL IVPB  Status:  Discontinued        2 g 200 mL/hr over 30 Minutes Intravenous Every 12 hours 06/18/23 0939 06/21/23 1128   06/18/23 0600  ceFEPIme (MAXIPIME) 2 g in sodium chloride 0.9 % 100 mL IVPB  Status:  Discontinued        2 g 200 mL/hr over 30 Minutes Intravenous Every 24 hours 06/17/23 0920 06/18/23 0939   06/17/23 1400  vancomycin (VANCOCIN) IVPB 1000 mg/200 mL premix  Status:  Discontinued        1,000 mg 200 mL/hr over 60 Minutes Intravenous Every 36 hours 06/17/23 0920 06/18/23 0939   06/16/23 1800  vancomycin (VANCOREADY) IVPB 750 mg/150 mL  Status:  Discontinued        750 mg 150 mL/hr over 60 Minutes Intravenous Every 24 hours 06/15/23 1639 06/17/23 0920   06/16/23 1100  cefTRIAXone (ROCEPHIN) 2 g in sodium chloride 0.9 % 100 mL IVPB  Status:  Discontinued        2 g 200 mL/hr over 30 Minutes Intravenous Every 24 hours 06/15/23 1453 06/15/23 1455   06/15/23 1800  ceFEPIme (MAXIPIME) 2 g in sodium chloride 0.9 % 100 mL IVPB  Status:  Discontinued        2 g 200 mL/hr over 30 Minutes Intravenous Every 12 hours 06/15/23 1558 06/17/23 0920   06/15/23 1700  metroNIDAZOLE (FLAGYL) IVPB 500 mg  Status:  Discontinued        500 mg 100 mL/hr over 60 Minutes Intravenous Every 12 hours 06/15/23 1455 06/21/23 1128   06/15/23 1630  vancomycin (VANCOREADY) IVPB 1500 mg/300 mL        1,500 mg 150 mL/hr over 120 Minutes Intravenous  Once 06/15/23 1558 06/15/23 2058   06/15/23 1045  cefTRIAXone (ROCEPHIN) 2 g in sodium chloride 0.9 % 100 mL IVPB  2 g 200  mL/hr over 30 Minutes Intravenous  Once 06/15/23 1041 06/15/23 1155       Objective:  Vital Signs  Vitals:   07/25/23 1940 07/26/23 0430 07/26/23 0500 07/26/23 0613  BP: 135/85 (!) 144/85  131/64  Pulse: 64 (!) 121  99  Resp: 16 16    Temp: 98.5 F (36.9 C) 98.1 F (36.7 C)  98.1 F (36.7 C)  TempSrc: Oral Oral  Oral  SpO2: 100% 100%    Weight:   78.7 kg   Height:        Intake/Output Summary (Last 24 hours) at 07/26/2023 0936 Last data filed at 07/26/2023 0920 Gross per 24 hour  Intake 240 ml  Output 1075 ml  Net -835 ml   Filed Weights   07/24/23 0500 07/25/23 0500 07/26/23 0500  Weight: 75.6 kg 79.9 kg 78.7 kg    General appearance: Awake alert.  In no distress Resp: Clear to auscultation bilaterally.  Normal effort Cardio: S1-S2 is normal regular.  No S3-S4.  No rubs murmurs or bruit GI: Abdomen is soft.  Nontender nondistended.  Bowel sounds are present normal.  No masses organomegaly Rectal exam revealed bleeding around the Flexi-Seal.  A blood also noted in the Flexi-Seal.   Lab Results:  Data Reviewed: I have personally reviewed following labs and reports of the imaging studies  CBC: Recent Labs  Lab 07/20/23 0210 07/21/23 0054 07/22/23 0415 07/25/23 0332 07/26/23 0900  WBC 5.5 5.7 6.1 6.8 6.3  NEUTROABS 3.0 3.0  --   --   --   HGB 8.7* 8.7* 8.7* 9.0* 9.5*  HCT 26.6* 26.7* 26.8* 28.9* 30.4*  MCV 88.7 88.4 88.4 90.3 90.5  PLT 218 276 239 295 324    Basic Metabolic Panel: Recent Labs  Lab 07/20/23 0210 07/20/23 1105 07/20/23 2113 07/21/23 0054 07/21/23 2008 07/22/23 0415 07/22/23 1824 07/23/23 0435 07/24/23 0329 07/25/23 0332 07/26/23 0328  NA 137 138 138 135  --  137  --  139 140 137 139  K 3.1* 5.3* 2.7* 2.6* 2.6* 3.2* 3.3* 2.9* 3.1* 3.2* 3.3*  CL 111 112* 109 108  --  112*  --  110 112* 110 111  CO2 19* 20* 20* 21*  --  21*  --  21* 21* 22 22  GLUCOSE 92 88 114* 95  --  88  --  79 96 67* 100*  BUN <5* <5* <5* <5*  --  <5*  --   <5* <5* <5* <5*  CREATININE 1.09 1.09 1.08 1.07  --  1.15  --  1.19 1.23 1.10 1.08  CALCIUM 7.1* 7.2* 7.5* 7.3*  --  7.4*  --  7.5* 7.7* 7.9* 8.1*  MG 2.0  --  2.0 2.0  --  1.9  --  2.1 2.1  --  2.1  PHOS 1.9* 2.1* 2.7 2.4* 1.7*  --   --   --   --   --   --     GFR: Estimated Creatinine Clearance: 54.5 mL/min (by C-G formula based on SCr of 1.08 mg/dL).  Liver Function Tests: Recent Labs  Lab 07/20/23 0210 07/21/23 0054  AST 29 26  ALT 24 22  ALKPHOS 25* 26*  BILITOT 0.5 0.6  PROT 5.9* 6.2*  ALBUMIN 2.5* 2.6*     CBG: Recent Labs  Lab 07/25/23 1124 07/25/23 1647 07/25/23 1706 07/25/23 2115 07/26/23 0728  GLUCAP 92 60* 87 120* 83     Recent Results (from the past 240 hour(s))  Gastrointestinal Panel by PCR , Stool     Status: None   Collection Time: 07/19/23 11:40 AM   Specimen: STOOL  Result Value Ref Range Status   Campylobacter species NOT DETECTED NOT DETECTED Final   Plesimonas shigelloides NOT DETECTED NOT DETECTED Final   Salmonella species NOT DETECTED NOT DETECTED Final   Yersinia enterocolitica NOT DETECTED NOT DETECTED Final   Vibrio species NOT DETECTED NOT DETECTED Final   Vibrio cholerae NOT DETECTED NOT DETECTED Final   Enteroaggregative E coli (EAEC) NOT DETECTED NOT DETECTED Final   Enteropathogenic E coli (EPEC) NOT DETECTED NOT DETECTED Final   Enterotoxigenic E coli (ETEC) NOT DETECTED NOT DETECTED Final   Shiga like toxin producing E coli (STEC) NOT DETECTED NOT DETECTED Final   Shigella/Enteroinvasive E coli (EIEC) NOT DETECTED NOT DETECTED Final   Cryptosporidium NOT DETECTED NOT DETECTED Final   Cyclospora cayetanensis NOT DETECTED NOT DETECTED Final   Entamoeba histolytica NOT DETECTED NOT DETECTED Final   Giardia lamblia NOT DETECTED NOT DETECTED Final   Adenovirus F40/41 NOT DETECTED NOT DETECTED Final   Astrovirus NOT DETECTED NOT DETECTED Final   Norovirus GI/GII NOT DETECTED NOT DETECTED Final   Rotavirus A NOT DETECTED NOT  DETECTED Final   Sapovirus (I, II, IV, and V) NOT DETECTED NOT DETECTED Final    Comment: Performed at [facility], [address] [place], [place] [address]  C Difficile Quick Screen (NO PCR Reflex)     Status: None   Collection Time: 07/19/23 11:40 AM   Specimen: STOOL  Result Value Ref Range Status   C Diff antigen NEGATIVE NEGATIVE Final   C Diff toxin NEGATIVE NEGATIVE Final   C Diff interpretation No C. difficile detected.  Final    Comment: Performed at [facility], [address], [place], [place] [address]      Radiology Studies: No results found.     LOS: 41 days   Henslee Lottman [employer] on [email or web address]  07/26/2023, 9:36 AM

## 2023-07-26 NOTE — Progress Notes (Signed)
Physical Therapy Treatment Patient Details Name: Eric Zamora MRN: [id number] DOB: 05-Jan-1946 Today's Date: 07/26/2023   History of Present Illness Pt is 77 yo admitted with severe sepsis due to perirectal abscess.  Pt to OR on 10/18 for drainage of abscess.  Pt with increased lethargy on 10/24 , MRI Head performed and pt found to have acute infarct  right frontal operculum with moderate associated cytotoxic edema and petechial hemorrhage. Pt with new onset Afib RVR during admission. Pt found to have ileus 07/06/23 and GI consulted.   Pt with frequent loose stools/diarrhea and has had flexiseal.  Flexiseal was discontinued 11/25 due to bright red blood.  PMH: bipolar disorder, dementia, history of NSTEMI, hypertrophic cardiomyopathy    PT Comments  Pt making significant improvement today.  No change in AM-PAC score b/c still needs assist of 2 but progressed from max x 2 to light mod A x 2 and was able to ambulate multiple bouts with less assist.  Pt continues to have frequent loose stools but flexiseal removed earlier due to bleeding - utilized a brief during transfers/ambulation to manage that was loosened but left as pad (clean) under pt at end of session (RN aware).  Cont POC with Patient will benefit from continued inpatient follow up therapy, <3 hours/day at d/c.    If plan is discharge home, recommend the following: Two people to help with walking and/or transfers;Two people to help with bathing/dressing/bathroom;Assistance with cooking/housework;Direct supervision/assist for financial management;Assist for transportation;Help with stairs or ramp for entrance   Can travel by private vehicle     No  Equipment Recommendations  None recommended by PT    Recommendations for Other Services       Precautions / Restrictions Precautions Precautions: Fall Precaution Comments: monitor HR     Mobility  Bed Mobility Overal bed mobility: Needs Assistance Bed Mobility: Rolling, Supine to  Sit Rolling: Mod assist, Used rails Sidelying to sit: Mod assist, Used rails, HOB elevated       General bed mobility comments: increased time  and cues for hand placement and leaning forward    Transfers Overall transfer level: Needs assistance Equipment used: Rolling walker (2 wheels) Transfers: Sit to/from Stand Sit to Stand: Mod assist, +2 physical assistance           General transfer comment: Performed STS x 4 during session with LIGHT Mod A x 2 to rise.  Cues for hand placement and standing upright, feet blocked    Ambulation/Gait Ambulation/Gait assistance: Mod assist, +2 safety/equipment Gait Distance (Feet): 10 Feet (4', 8', 10') Assistive device: Rolling walker (2 wheels) Gait Pattern/deviations: Step-to pattern, Narrow base of support Gait velocity: decreased     General Gait Details: Very narrow BOS - cues to widen, Pt ambulated multiple bouts with close chair follow and seated rest breaks.  Requiring mod A for RW management and balance (tendency to lean posteriorly).  With turns cues and assist to stay close to [facility]   Stairs             Wheelchair Mobility     Tilt Bed    Modified Rankin (Stroke Patients Only) Modified Rankin (Stroke Patients Only) Pre-Morbid Rankin Score: Moderate disability Modified Rankin: Severe disability     Balance Overall balance assessment: Needs assistance Sitting-balance support: No upper extremity supported, Feet supported Sitting balance-Leahy Scale: Fair Sitting balance - Comments: Able to sit EOB with supervision   Standing balance support: Bilateral upper extremity supported Standing balance-Leahy Scale: Poor Standing balance  comment: RW and mod A                            Cognition Arousal: Alert Behavior During Therapy: WFL for tasks assessed/performed Overall Cognitive Status: No family/caregiver present to determine baseline cognitive functioning                                  General Comments: alert, answers simple questions, slurred speach/dysarthric, follows commands with increased time and cuing        Exercises General Exercises - Lower Extremity Ankle Circles/Pumps: AROM, 10 reps, Supine, Both Quad Sets: AROM, Both, 10 reps, Supine Long Arc Quad: AROM, Seated, 20 reps, 10 reps Heel Slides: Both, 10 reps, Supine, AROM    General Comments General comments (skin integrity, edema, etc.): HR Max 135 bpm      Pertinent Vitals/Pain Pain Assessment Pain Assessment: No/denies pain    Home Living                          Prior Function            PT Goals (current goals can now be found in the care plan section) Progress towards PT goals: Progressing toward goals    Frequency    Min 1X/week      PT Plan      Co-evaluation              AM-PAC PT "6 Clicks" Mobility   Outcome Measure  Help needed turning from your back to your side while in a flat bed without using bedrails?: A Lot Help needed moving from lying on your back to sitting on the side of a flat bed without using bedrails?: A Lot Help needed moving to and from a bed to a chair (including a wheelchair)?: Total Help needed standing up from a chair using your arms (e.g., wheelchair or bedside chair)?: Total Help needed to walk in hospital room?: Total Help needed climbing 3-5 steps with a railing? : Total 6 Click Score: 8    End of Session Equipment Utilized During Treatment: Gait belt Activity Tolerance: Patient tolerated treatment well Patient left: with call bell/phone within reach;in bed;with bed alarm set (returned to bed due to loose stools to allow for cleaning when needed) Nurse Communication: Mobility status PT Visit Diagnosis: Other abnormalities of gait and mobility (R26.89);Muscle weakness (generalized) (M62.81)     Time: 1610-9604 PT Time Calculation (min) (ACUTE ONLY): 25 min  Charges:    $Gait Training: 8-22 mins $Therapeutic Activity:  8-22 mins PT General Charges $$ ACUTE PT VISIT: 1 Visit                     Anise Salvo, PT Acute Rehab Services  Rehab [phone]    Rayetta Humphrey 07/26/2023, 12:09 PM

## 2023-07-27 DIAGNOSIS — R197 Diarrhea, unspecified: Secondary | ICD-10-CM

## 2023-07-27 DIAGNOSIS — K625 Hemorrhage of anus and rectum: Secondary | ICD-10-CM

## 2023-07-27 DIAGNOSIS — E876 Hypokalemia: Secondary | ICD-10-CM | POA: Diagnosis not present

## 2023-07-27 DIAGNOSIS — I4891 Unspecified atrial fibrillation: Secondary | ICD-10-CM | POA: Diagnosis not present

## 2023-07-27 LAB — CBC
HCT: 30.2 % — ABNORMAL LOW (ref 39.0–52.0)
Hemoglobin: 9.3 g/dL — ABNORMAL LOW (ref 13.0–17.0)
MCH: 27.8 pg (ref 26.0–34.0)
MCHC: 30.8 g/dL (ref 30.0–36.0)
MCV: 90.1 fL (ref 80.0–100.0)
Platelets: 312 10*3/uL (ref 150–400)
RBC: 3.35 MIL/uL — ABNORMAL LOW (ref 4.22–5.81)
RDW: 15.1 % (ref 11.5–15.5)
WBC: 5.5 10*3/uL (ref 4.0–10.5)
nRBC: 0 % (ref 0.0–0.2)

## 2023-07-27 LAB — BASIC METABOLIC PANEL
Anion gap: 7 (ref 5–15)
BUN: 5 mg/dL — ABNORMAL LOW (ref 8–23)
CO2: 22 mmol/L (ref 22–32)
Calcium: 7.9 mg/dL — ABNORMAL LOW (ref 8.9–10.3)
Chloride: 108 mmol/L (ref 98–111)
Creatinine, Ser: 1.14 mg/dL (ref 0.61–1.24)
GFR, Estimated: >60 mL/min (ref >60)
Glucose, Bld: 92 mg/dL (ref 70–99)
Potassium: 3.2 mmol/L — ABNORMAL LOW (ref 3.5–5.1)
Sodium: 137 mmol/L (ref 135–145)

## 2023-07-27 LAB — GLUCOSE, CAPILLARY
Glucose-Capillary: 77 mg/dL (ref 70–99)
Glucose-Capillary: 156 mg/dL — ABNORMAL HIGH (ref 70–99)
Glucose-Capillary: 120 mg/dL — ABNORMAL HIGH (ref 70–99)
Glucose-Capillary: 147 mg/dL — ABNORMAL HIGH (ref 70–99)

## 2023-07-27 LAB — MAGNESIUM: Magnesium: 2 mg/dL (ref 1.7–2.4)

## 2023-07-27 MED ORDER — POTASSIUM CHLORIDE CRYS ER 20 MEQ PO TBCR
40.0000 meq | EXTENDED_RELEASE_TABLET | ORAL | Status: AC
  Administered 2023-07-27 (×2): 40 meq via ORAL
  Filled 2023-07-27 (×2): qty 2

## 2023-07-27 MED ORDER — LOPERAMIDE HCL 2 MG PO CAPS
2.0000 mg | ORAL_CAPSULE | Freq: Two times a day (BID) | ORAL | Status: DC
  Administered 2023-07-27 – 2023-08-01 (×10): 2 mg via ORAL
  Filled 2023-07-27 (×10): qty 1

## 2023-07-27 NOTE — Progress Notes (Signed)
Occupational Therapy Treatment Patient Details Name: Eric Zamora MRN: [id number] DOB: 15-Jan-1946 Today's Date: 07/27/2023   History of present illness Pt is 77 yr old admitted with severe sepsis due to perirectal abscess.  Pt to OR on 10/18 for drainage of abscess.  Pt with increased lethargy on 10/24 , MRI Head performed and pt found to have acute infarct  right frontal operculum with moderate associated cytotoxic edema and petechial hemorrhage. Pt with new onset Afib RVR during admission. Pt found to have ileus 07/06/23 and GI consulted.   Pt with frequent loose stools/diarrhea and has had flexiseal.  Flexiseal was discontinued 11/25 due to bright red blood.  PMH: bipolar disorder, dementia, history of NSTEMI, hypertrophic cardiomyopathy   OT comments  The pt required significant assistance for toileting management at bed level, as he was noted to be with a few episodes of loose bowel. He was further assisted into sitting EOB, where he performed face washing, though he required mod assist and increased cues to correct posterior leaning; he required total assist to don his socks seated EOB. 2 person assist was needed for him to stand from the EOB, as well as take lateral steps towards the [facility]; he presented with impaired standing balance and intermittent posterior lean. He presented with good effort and participation. Continue OT plan of care. Patient will benefit from continued inpatient follow up therapy, <3 hours/day.       If plan is discharge home, recommend the following:  Two people to help with walking and/or transfers;Direct supervision/assist for medications management;Help with stairs or ramp for entrance;Assist for transportation;A lot of help with walking and/or transfers;Supervision due to cognitive status;Assistance with cooking/housework   Equipment Recommendations  Other (comment) (defer to next level of care)    Recommendations for Other Services      Precautions /  Restrictions Precautions Precautions: Fall Restrictions Weight Bearing Restrictions: No       Mobility Bed Mobility Overal bed mobility: Needs Assistance Bed Mobility: Supine to Sit, Sit to Supine, Rolling Rolling: Mod assist   Supine to sit: Max assist, Used rails, HOB elevated Sit to supine: Mod assist, +2 for physical assistance        Transfers Overall transfer level: Needs assistance Equipment used: Rolling walker (2 wheels) Transfers: Sit to/from Stand Sit to Stand: Mod assist, +2 physical assistance           General transfer comment: Pt was instructed on taking lateral steps towards the [facility] using a RW, for which he required mod assist x2 with constant assist for balance, as well as verbal and tactile cues to correct posterior lean, to advance RW and to increase BLE base of support     ADL either performed or assessed with clinical judgement   ADL Overall ADL's : Needs assistance/impaired     Grooming: Moderate assistance;Wash/dry face;Sitting Grooming Details (indicate cue type and reason): Pt able to grasp a wash clothe, bring it to his face, and perform face washing seated EOB, though needed mod assist and cues to maintain sitting balance, given posterior leaning             Lower Body Dressing: Total assistance Lower Body Dressing Details (indicate cue type and reason): He required total assist to don his socks seated EOB, as he limited poor sitting balance, given noted posterior leaning.                      Cognition Arousal: Alert Behavior During Therapy:  WFL for tasks assessed/performed Overall Cognitive Status: No family/caregiver present to determine baseline cognitive functioning    General Comments: alert, cooperative, able to follow simple commands with slightly increased time and occasional repetition of prompts                   Pertinent Vitals/ Pain       Pain Assessment Pain Assessment: No/denies pain          Frequency  Min 1X/week        Progress Toward Goals  OT Goals(current goals can now be found in the care plan section)     Acute Rehab OT Goals Patient Stated Goal: he did not specifically state OT Goal Formulation: With patient Time For Goal Achievement: 08/09/23 Potential to Achieve Goals: Fair  Plan         AM-PAC OT "6 Clicks" Daily Activity     Outcome Measure   Help from another person eating meals?: A Little Help from another person taking care of personal grooming?: A Little Help from another person toileting, which includes using toliet, bedpan, or urinal?: Total Help from another person bathing (including washing, rinsing, drying)?: A Lot Help from another person to put on and taking off regular upper body clothing?: A Lot Help from another person to put on and taking off regular lower body clothing?: Total 6 Click Score: 12    End of Session Equipment Utilized During Treatment: Rolling walker (2 wheels);Gait belt  OT Visit Diagnosis: Other abnormalities of gait and mobility (R26.89);Muscle weakness (generalized) (M62.81);Unsteadiness on feet (R26.81)   Activity Tolerance Patient tolerated treatment well   Patient Left in bed;with call bell/phone within reach;with bed alarm set   Nurse Communication Mobility status        Time: 1308-6578 OT Time Calculation (min): 30 min  Charges: OT General Charges $OT Visit: 1 Visit OT Treatments $Therapeutic Activity: 8-22 mins     Reuben Likes, OTR/L 07/27/2023, 5:16 PM

## 2023-07-27 NOTE — Plan of Care (Signed)
Problem: Education: Goal: Ability to describe self-care measures that may prevent or decrease complications (Diabetes Survival Skills Education) will improve Outcome: Progressing Goal: Individualized Educational Video(s) Outcome: Progressing   Problem: Coping: Goal: Ability to adjust to condition or change in health will improve Outcome: Progressing   Problem: Fluid Volume: Goal: Ability to maintain a balanced intake and output will improve Outcome: Progressing   Problem: Health Behavior/Discharge Planning: Goal: Ability to identify and utilize available resources and services will improve Outcome: Progressing Goal: Ability to manage health-related needs will improve Outcome: Progressing   Problem: Metabolic: Goal: Ability to maintain appropriate glucose levels will improve Outcome: Progressing   Problem: Nutritional: Goal: Maintenance of adequate nutrition will improve Outcome: Progressing Goal: Progress toward achieving an optimal weight will improve Outcome: Progressing   Problem: Skin Integrity: Goal: Risk for impaired skin integrity will decrease Outcome: Progressing   Problem: Tissue Perfusion: Goal: Adequacy of tissue perfusion will improve Outcome: Progressing   Problem: Education: Goal: Knowledge of General Education information will improve Description: Including pain rating scale, medication(s)/side effects and non-pharmacologic comfort measures Outcome: Progressing   Problem: Health Behavior/Discharge Planning: Goal: Ability to manage health-related needs will improve Outcome: Progressing   Problem: Clinical Measurements: Goal: Ability to maintain clinical measurements within normal limits will improve Outcome: Progressing Goal: Will remain free from infection Outcome: Progressing Goal: Diagnostic test results will improve Outcome: Progressing Goal: Respiratory complications will improve Outcome: Progressing Goal: Cardiovascular complication will  be avoided Outcome: Progressing   Problem: Activity: Goal: Risk for activity intolerance will decrease Outcome: Progressing   Problem: Nutrition: Goal: Adequate nutrition will be maintained Outcome: Progressing   Problem: Coping: Goal: Level of anxiety will decrease Outcome: Progressing   Problem: Elimination: Goal: Will not experience complications related to bowel motility Outcome: Progressing Goal: Will not experience complications related to urinary retention Outcome: Progressing   Problem: Pain Managment: Goal: General experience of comfort will improve Outcome: Progressing   Problem: Safety: Goal: Ability to remain free from injury will improve Outcome: Progressing   Problem: Skin Integrity: Goal: Risk for impaired skin integrity will decrease Outcome: Progressing   Problem: Fluid Volume: Goal: Hemodynamic stability will improve Outcome: Progressing   Problem: Clinical Measurements: Goal: Diagnostic test results will improve Outcome: Progressing Goal: Signs and symptoms of infection will decrease Outcome: Progressing   Problem: Respiratory: Goal: Ability to maintain adequate ventilation will improve Outcome: Progressing   Problem: Education: Goal: Ability to describe self-care measures that may prevent or decrease complications (Diabetes Survival Skills Education) will improve Outcome: Progressing Goal: Individualized Educational Video(s) Outcome: Progressing   Problem: Cardiac: Goal: Ability to maintain an adequate cardiac output will improve Outcome: Progressing   Problem: Health Behavior/Discharge Planning: Goal: Ability to identify and utilize available resources and services will improve Outcome: Progressing Goal: Ability to manage health-related needs will improve Outcome: Progressing   Problem: Fluid Volume: Goal: Ability to achieve a balanced intake and output will improve Outcome: Progressing   Problem: Metabolic: Goal: Ability to  maintain appropriate glucose levels will improve Outcome: Progressing   Problem: Nutritional: Goal: Maintenance of adequate nutrition will improve Outcome: Progressing Goal: Maintenance of adequate weight for body size and type will improve Outcome: Progressing   Problem: Respiratory: Goal: Will regain and/or maintain adequate ventilation Outcome: Progressing   Problem: Urinary Elimination: Goal: Ability to achieve and maintain adequate renal perfusion and functioning will improve Outcome: Progressing   Problem: Education: Goal: Knowledge of disease or condition will improve Outcome: Progressing Goal: Knowledge of secondary prevention will improve (  MUST DOCUMENT ALL) Outcome: Progressing Goal: Knowledge of patient specific risk factors will improve Loraine Leriche N/A or DELETE if not current risk factor) Outcome: Progressing

## 2023-07-27 NOTE — Progress Notes (Signed)
[facility] PROGRESS NOTE   Eric Zamora QMV:[id number] DOB: 1946-06-17 DOA: 06/15/2023  PCP: Uvaldo Bristle, PA-C  Brief History: 77 y.o. 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   10/15 admitted to stepdown unit. 10/17: Overnight with fever, aspiration event, new AG acidosis, transferred back to SDU for BiPAP and insulin drip 10/18: taken to OR for drainage of abscess 10/23 cardiology was consulted for new onset A-fib with RVR. 10/24 nephrology was consulted for AKI and hyponatremia.  CT head was obtained for ongoing AMS and was found to have acute/subacute right MCA territory infarct.  Neurology was also consulted. 10/25.  Palliative care was consulted. 10/31.  Anticoagulation started again. 11/1.  Second dose of Bicillin given.  Underwent MBS. 11/2.  Marinol started for poor p.o. intake. 11/4.  Found to have ileus.  GI consulted.  Recommended DNR to legal guardian. 11/5.  Two-physician DNR form was signed and sent to guardian.  Will await decision. Subsequently developed colitis.  Significant diarrhea noted.  GI was consulted.  C. difficile and GI pathogen panel testing were negative.  Consultants: Gastroenterology.  General surgery.  Procedures: Drainage of rectal abscess    Subjective/Interval History: No further bleeding from rectal area has been reported.  He had brown stool overnight.  Patient denies any pain.  No nausea vomiting.  Assessment/Plan:  Perirectal abscess with severe sepsis, present on admission Sepsis physiology improved.   Patient was seen by general surgery and underwent drainage of the abscess on 06/18/2023. Patient was on broad-spectrum antibiotics initially which was transitioned to cefepime and then to Augmentin and doxycycline.  2-week course was recommended from initial  I&D.  Has completed course of antibiotics.  Presumed colitis/rectal bleeding Patient was started on Zosyn.  Gastroenterology was consulted due to profuse diarrhea.  Stool studies were negative. Patient was started on cholestyramine and then Imodium.  Zosyn was discontinued.  Diarrhea had slowed down. Overnight on 11/24-25 he was noted to have blood in the rectal tube.  Bleeding noted around the tube insertion site.  Tube was discontinued as it was felt to be responsible for some of the irritation causing the bleeding.  Lovenox was held for a day.  Bleeding has subsided.  Hemoglobin is stable.  Okay to resume Lovenox from tonight.  Follow hemoglobin tomorrow morning. Gastroenterology does not have much to offer now.  Will increase the dose of Imodium which will hopefully slow down the diarrhea some more.  If there is no further bleeding with stable hemoglobin and if electrolytes remain stable and if diarrhea subsides to a tolerable level then he should be ready for discharge in the next 1 to 2 days.   New onset atrial fibrillation with RVR Seen by cardiology.  On metoprolol.  Oral anticoagulation was held due to concern for ileus.  Placed on Lovenox.   Lovenox was held yesterday due to rectal bleeding.  Bleeding is subsided.  Resume Lovenox tonight.  Transition to apixaban in 24 to 48 hours depending on hemoglobin trends.    Multiple electrolyte abnormalities including severe hypokalemia Secondary to GI loss.  Continue to supplement.  Magnesium is 2.0.  Acute metabolic encephalopathy/history of dementia/acute right MCA stroke/oropharyngeal dysphagia CT head on 06/20/2023 showed acute versus subacute stroke.  MRI brain showed acute stroke.  MRA did not show any acute  abnormalities.  Carotid Dopplers did not show any significant stenosis.  EEG did not show any seizures.  Seen by neurology.  They recommended resumption of anticoagulation. Seen by speech therapy as well.  Currently on dysphagia 1 diet  with nectar thick liquids. Mentation seems to be close to baseline now.  History of seizures EEG negative for seizure activity Noted to be on Keppra.    Normocytic anemia Stable hemoglobin for the most part.    Acute kidney injury on chronic kidney disease stage IIIb/none anion gap metabolic acidosis Renal function has recovered.  Monitor urine output.  Avoid nephrotoxic agents.  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 Previous rounding physician discussed with ID and apparently patient is being treated with Bicillin on a weekly basis.  Fluid in the right inguinal canal This was noted on recent CT scan as well as scrotal ultrasound.  Significance of this is unclear.  Would recommend that the [place] be repeated in 1 to 2 months.  History of coronary artery disease Stable.  Bipolar disorder Stable.  Diabetes mellitus type 2, uncontrolled with hyperglycemia Noted to have hypoglycemic episodes. His insulin was discontinued.  Monitor CBGs.  Encourage oral intake.  HbA1c was 9.4 in October.  Oral thrush Completed 7-day course of Diflucan.  Goals of care Patient has a legal guardian.  Discussions were held with legal guardian regarding his poor prognosis.  Palliative care was also consulted.  Patient transitioned to DNR.  DVT Prophylaxis: On Lovenox Code Status: DNR Family Communication: No family at bedside Disposition Plan: SNF when medically stable     Medications: Scheduled:  Chlorhexidine Gluconate Cloth  6 each Topical Daily   cholestyramine light  4 g Oral QID   enoxaparin (LOVENOX) injection  80 mg Subcutaneous Q12H   feeding supplement  237 mL Oral TID BM   Gerhardt's butt cream   Topical BID   levETIRAcetam  500 mg Oral BID   loperamide  2 mg Oral Daily   magic mouthwash  10 mL Oral QID   metoprolol tartrate  25 mg Oral BID   nystatin  5 mL Oral QID   mouth rinse  15 mL Mouth Rinse 4 times per day   pantoprazole  40 mg Oral Daily   PARoxetine  10  mg Oral Daily   simethicone  40 mg Oral QID   simvastatin  20 mg Oral QHS   sodium bicarbonate  650 mg Oral BID   sodium chloride flush  3 mL Intravenous Q12H   Continuous:   ZOX:[id number], albuterol, HYDROmorphone (DILAUDID) injection, iohexol, ondansetron **OR** ondansetron (ZOFRAN) IV, mouth rinse, sodium chloride flush  Antibiotics: Anti-infectives (From admission, onward)    Start     Dose/Rate Route Frequency Ordered Stop   07/20/23 1000  fluconazole (DIFLUCAN) tablet 200 mg        200 mg Oral Daily 07/19/23 1145 07/26/23 0942   07/19/23 1300  fluconazole (DIFLUCAN) tablet 100 mg        100 mg Oral  Once 07/19/23 1145 07/19/23 1303   07/18/23 1830  piperacillin-tazobactam (ZOSYN) IVPB 3.375 g  Status:  Discontinued        3.375 g 12.5 mL/hr over 240 Minutes Intravenous Every 8 hours 07/18/23 1808 07/24/23 1427   07/16/23 1300  fluconazole (DIFLUCAN) tablet 100 mg  Status:  Discontinued        100 mg Oral Daily 07/16/23 1154 07/19/23 1145   07/05/23 1600  fluconazole (DIFLUCAN) IVPB 200 mg  200 mg 100 mL/hr over 60 Minutes Intravenous Every 24 hours 07/05/23 1320 07/08/23 1658   07/02/23 1000  fluconazole (DIFLUCAN) tablet 100 mg  Status:  Discontinued        100 mg Oral Daily 07/02/23 0802 07/05/23 1320   07/02/23 0800  penicillin g benzathine (BICILLIN LA) 1200000 UNIT/2ML injection 2.4 Million Units        2.4 Million Units Intramuscular Weekly 07/01/23 1209 07/09/23 0950   06/26/23 1500  penicillin g benzathine (BICILLIN LA) 1200000 UNIT/2ML injection 2.4 Million Units        2.4 Million Units Intramuscular  Once 06/26/23 1351 06/28/23 0018   06/24/23 2200  amoxicillin-clavulanate (AUGMENTIN) 500-125 MG per tablet 1 tablet  Status:  Discontinued        1 tablet Oral Every 12 hours 06/24/23 1450 07/03/23 1202   06/24/23 2200  amoxicillin-clavulanate (AUGMENTIN) 500-125 MG per tablet 1 tablet  Status:  Discontinued        1 tablet Oral 2 times daily 06/24/23 1451  06/24/23 1451   06/21/23 1215  doxycycline (VIBRA-TABS) tablet 100 mg  Status:  Discontinued        100 mg Oral Every 12 hours 06/21/23 1128 07/03/23 1202   06/21/23 1215  amoxicillin-clavulanate (AUGMENTIN) 875-125 MG per tablet 1 tablet  Status:  Discontinued        1 tablet Oral Every 12 hours 06/21/23 1128 06/24/23 1450   06/20/23 1200  vancomycin (VANCOREADY) IVPB 1500 mg/300 mL  Status:  Discontinued        1,500 mg 150 mL/hr over 120 Minutes Intravenous Every 36 hours 06/20/23 0719 06/21/23 1128   06/19/23 0000  vancomycin (VANCOREADY) IVPB 1250 mg/250 mL  Status:  Discontinued        1,250 mg 166.7 mL/hr over 90 Minutes Intravenous Every 36 hours 06/18/23 0939 06/20/23 0719   06/18/23 1800  ceFEPIme (MAXIPIME) 2 g in sodium chloride 0.9 % 100 mL IVPB  Status:  Discontinued        2 g 200 mL/hr over 30 Minutes Intravenous Every 12 hours 06/18/23 0939 06/21/23 1128   06/18/23 0600  ceFEPIme (MAXIPIME) 2 g in sodium chloride 0.9 % 100 mL IVPB  Status:  Discontinued        2 g 200 mL/hr over 30 Minutes Intravenous Every 24 hours 06/17/23 0920 06/18/23 0939   06/17/23 1400  vancomycin (VANCOCIN) IVPB 1000 mg/200 mL premix  Status:  Discontinued        1,000 mg 200 mL/hr over 60 Minutes Intravenous Every 36 hours 06/17/23 0920 06/18/23 0939   06/16/23 1800  vancomycin (VANCOREADY) IVPB 750 mg/150 mL  Status:  Discontinued        750 mg 150 mL/hr over 60 Minutes Intravenous Every 24 hours 06/15/23 1639 06/17/23 0920   06/16/23 1100  cefTRIAXone (ROCEPHIN) 2 g in sodium chloride 0.9 % 100 mL IVPB  Status:  Discontinued        2 g 200 mL/hr over 30 Minutes Intravenous Every 24 hours 06/15/23 1453 06/15/23 1455   06/15/23 1800  ceFEPIme (MAXIPIME) 2 g in sodium chloride 0.9 % 100 mL IVPB  Status:  Discontinued        2 g 200 mL/hr over 30 Minutes Intravenous Every 12 hours 06/15/23 1558 06/17/23 0920   06/15/23 1700  metroNIDAZOLE (FLAGYL) IVPB 500 mg  Status:  Discontinued        500  mg 100 mL/hr over 60 Minutes Intravenous Every 12 hours 06/15/23 1455 06/21/23  1128   06/15/23 1630  vancomycin (VANCOREADY) IVPB 1500 mg/300 mL        1,500 mg 150 mL/hr over 120 Minutes Intravenous  Once 06/15/23 1558 06/15/23 2058   06/15/23 1045  cefTRIAXone (ROCEPHIN) 2 g in sodium chloride 0.9 % 100 mL IVPB        2 g 200 mL/hr over 30 Minutes Intravenous  Once 06/15/23 1041 06/15/23 1155       Objective:  Vital Signs  Vitals:   07/26/23 1906 07/26/23 2205 07/27/23 0447 07/27/23 0724  BP: (!) 145/100 134/74 128/81   Pulse:  86 62   Resp: (!) 21  20   Temp: 98.9 F (37.2 C)  98.4 F (36.9 C)   TempSrc:      SpO2: 100%  100%   Weight:    75.7 kg  Height:        Intake/Output Summary (Last 24 hours) at 07/27/2023 0856 Last data filed at 07/27/2023 0557 Gross per 24 hour  Intake 50 ml  Output 800 ml  Net -750 ml   Filed Weights   07/25/23 0500 07/26/23 0500 07/27/23 0724  Weight: 79.9 kg 78.7 kg 75.7 kg    General appearance: Awake alert.  In no distress Resp: Clear to auscultation bilaterally.  Normal effort Cardio: S1-S2 is normal regular.  No S3-S4.  No rubs murmurs or bruit GI: Abdomen is soft.  Nontender nondistended.  Bowel sounds are present normal.  No masses organomegaly   Lab Results:  Data Reviewed: I have personally reviewed following labs and reports of the imaging studies  CBC: Recent Labs  Lab 07/21/23 0054 07/22/23 0415 07/25/23 0332 07/26/23 0900 07/26/23 1725 07/27/23 0331  WBC 5.7 6.1 6.8 6.3  --  5.5  NEUTROABS 3.0  --   --   --   --   --   HGB 8.7* 8.7* 9.0* 9.5* 9.4* 9.3*  HCT 26.7* 26.8* 28.9* 30.4* 29.4* 30.2*  MCV 88.4 88.4 90.3 90.5  --  90.1  PLT 276 239 295 324  --  312    Basic Metabolic Panel: Recent Labs  Lab 07/20/23 1105 07/20/23 1105 07/20/23 2113 07/21/23 0054 07/21/23 2008 07/22/23 0415 07/22/23 1824 07/23/23 0435 07/24/23 0329 07/25/23 0332 07/26/23 0328 07/27/23 0331  NA 138  --  138 135  --   137  --  139 140 137 139 137  K 5.3*  --  2.7* 2.6* 2.6* 3.2*   < > 2.9* 3.1* 3.2* 3.3* 3.2*  CL 112*  --  109 108  --  112*  --  110 112* 110 111 108  CO2 20*  --  20* 21*  --  21*  --  21* 21* 22 22 22   GLUCOSE 88  --  114* 95  --  88  --  79 96 67* 100* 92  BUN <5*  --  <5* <5*  --  <5*  --  <5* <5* <5* <5* 5*  CREATININE 1.09  --  1.08 1.07  --  1.15  --  1.19 1.23 1.10 1.08 1.14  CALCIUM 7.2*  --  7.5* 7.3*  --  7.4*  --  7.5* 7.7* 7.9* 8.1* 7.9*  MG  --    < > 2.0 2.0  --  1.9  --  2.1 2.1  --  2.1 2.0  PHOS 2.1*  --  2.7 2.4* 1.7*  --   --   --   --   --   --   --    < > =  values in this interval not displayed.    GFR: Estimated Creatinine Clearance: 50.8 mL/min (by C-G formula based on SCr of 1.14 mg/dL).  Liver Function Tests: Recent Labs  Lab 07/21/23 0054  AST 26  ALT 22  ALKPHOS 26*  BILITOT 0.6  PROT 6.2*  ALBUMIN 2.6*     CBG: Recent Labs  Lab 07/26/23 0728 07/26/23 1129 07/26/23 1608 07/26/23 2039 07/27/23 0724  GLUCAP 83 114* 117* 84 77     Recent Results (from the past 240 hour(s))  Gastrointestinal Panel by PCR , Stool     Status: None   Collection Time: 07/19/23 11:40 AM   Specimen: STOOL  Result Value Ref Range Status   Campylobacter species NOT DETECTED NOT DETECTED Final   Plesimonas shigelloides NOT DETECTED NOT DETECTED Final   Salmonella species NOT DETECTED NOT DETECTED Final   Yersinia enterocolitica NOT DETECTED NOT DETECTED Final   Vibrio species NOT DETECTED NOT DETECTED Final   Vibrio cholerae NOT DETECTED NOT DETECTED Final   Enteroaggregative E coli (EAEC) NOT DETECTED NOT DETECTED Final   Enteropathogenic E coli (EPEC) NOT DETECTED NOT DETECTED Final   Enterotoxigenic E coli (ETEC) NOT DETECTED NOT DETECTED Final   Shiga like toxin producing E coli (STEC) NOT DETECTED NOT DETECTED Final   Shigella/Enteroinvasive E coli (EIEC) NOT DETECTED NOT DETECTED Final   Cryptosporidium NOT DETECTED NOT DETECTED Final   Cyclospora  cayetanensis NOT DETECTED NOT DETECTED Final   Entamoeba histolytica NOT DETECTED NOT DETECTED Final   Giardia lamblia NOT DETECTED NOT DETECTED Final   Adenovirus F40/41 NOT DETECTED NOT DETECTED Final   Astrovirus NOT DETECTED NOT DETECTED Final   Norovirus GI/GII NOT DETECTED NOT DETECTED Final   Rotavirus A NOT DETECTED NOT DETECTED Final   Sapovirus (I, II, IV, and V) NOT DETECTED NOT DETECTED Final    Comment: Performed at [facility], [address] [place], [place] [address]  C Difficile Quick Screen (NO PCR Reflex)     Status: None   Collection Time: 07/19/23 11:40 AM   Specimen: STOOL  Result Value Ref Range Status   C Diff antigen NEGATIVE NEGATIVE Final   C Diff toxin NEGATIVE NEGATIVE Final   C Diff interpretation No C. difficile detected.  Final    Comment: Performed at [facility], [address] [place], [place] [address]      Radiology Studies: No results found.     LOS: 42 days   Iyla Balzarini [employer] on [email or web address]  07/27/2023, 8:56 AM

## 2023-07-28 DIAGNOSIS — A419 Sepsis, unspecified organism: Secondary | ICD-10-CM | POA: Diagnosis not present

## 2023-07-28 DIAGNOSIS — R6520 Severe sepsis without septic shock: Secondary | ICD-10-CM | POA: Diagnosis not present

## 2023-07-28 LAB — BASIC METABOLIC PANEL
Anion gap: 7 (ref 5–15)
BUN: 7 mg/dL — ABNORMAL LOW (ref 8–23)
CO2: 23 mmol/L (ref 22–32)
Calcium: 8.2 mg/dL — ABNORMAL LOW (ref 8.9–10.3)
Chloride: 112 mmol/L — ABNORMAL HIGH (ref 98–111)
Creatinine, Ser: 1.15 mg/dL (ref 0.61–1.24)
GFR, Estimated: >60 mL/min (ref >60)
Glucose, Bld: 112 mg/dL — ABNORMAL HIGH (ref 70–99)
Potassium: 3.2 mmol/L — ABNORMAL LOW (ref 3.5–5.1)
Sodium: 142 mmol/L (ref 135–145)

## 2023-07-28 LAB — GLUCOSE, CAPILLARY
Glucose-Capillary: 109 mg/dL — ABNORMAL HIGH (ref 70–99)
Glucose-Capillary: 169 mg/dL — ABNORMAL HIGH (ref 70–99)
Glucose-Capillary: 115 mg/dL — ABNORMAL HIGH (ref 70–99)
Glucose-Capillary: 109 mg/dL — ABNORMAL HIGH (ref 70–99)

## 2023-07-28 LAB — CBC
HCT: 29.2 % — ABNORMAL LOW (ref 39.0–52.0)
Hemoglobin: 9.3 g/dL — ABNORMAL LOW (ref 13.0–17.0)
MCH: 28.4 pg (ref 26.0–34.0)
MCHC: 31.8 g/dL (ref 30.0–36.0)
MCV: 89.3 fL (ref 80.0–100.0)
Platelets: 304 10*3/uL (ref 150–400)
RBC: 3.27 MIL/uL — ABNORMAL LOW (ref 4.22–5.81)
RDW: 14.8 % (ref 11.5–15.5)
WBC: 5.7 10*3/uL (ref 4.0–10.5)
nRBC: 0 % (ref 0.0–0.2)

## 2023-07-28 MED ORDER — APIXABAN 5 MG PO TABS
5.0000 mg | ORAL_TABLET | Freq: Two times a day (BID) | ORAL | Status: DC
  Administered 2023-07-28 – 2023-08-02 (×10): 5 mg via ORAL
  Filled 2023-07-28 (×10): qty 1

## 2023-07-28 MED ORDER — POTASSIUM CHLORIDE CRYS ER 20 MEQ PO TBCR
40.0000 meq | EXTENDED_RELEASE_TABLET | ORAL | Status: AC
  Administered 2023-07-28 (×2): 40 meq via ORAL
  Filled 2023-07-28 (×2): qty 2

## 2023-07-28 NOTE — Progress Notes (Signed)
Mobility Specialist - Progress Note   07/28/23 1349  Mobility  Activity Transferred from chair to bed  Level of Assistance Total care  Assistive Device MaxiSky / Overhead Lift  Activity Response Tolerated well  $Mobility charge 1 Mobility  Mobility Specialist Start Time (ACUTE ONLY) 1325  Mobility Specialist Stop Time (ACUTE ONLY) 1335  Mobility Specialist Time Calculation (min) (ACUTE ONLY) 10 min   Received in chair. Had NT assist in lift transfer. No issues and left in bed with all needs met.  Marilynne Halsted Mobility Specialist

## 2023-07-28 NOTE — Progress Notes (Addendum)
Speech Language Pathology Treatment: Dysphagia  Patient Details Name: Eric Zamora MRN: [id number] DOB: 07-04-1946 Today's Date: 07/28/2023 Time: 1191-4782 SLP Time Calculation (min) (ACUTE ONLY): 28 min  Assessment / Plan / Recommendation Clinical Impression  Skilled SLP follow-up to address readiness for dietary advancement especially in light of poor p.o. intake. Upon arrival to room patient was significantly coughing and states he just started coughing today with liquids. Noted water in the room that had thickener that was in the lower part of the cup thus not stirred properly, advised RN as NT was busy at front desk. Suspect pt aspirated this thin water.   Cranial nerve exam reconducted due to this episode with bilateral palate elevation and lingual protrusion dense right facial asymmetry.   After extensive coughing episode abated he was willing to try something from a snack machine thus SLP purchased [employer]. Despite cues to take small bites patient placed whole Sun chip in his mouth requiring extended time to masticate and use of nectar thick liquid and applesauce to transit into pharynx. SLP utilized applesauce and nectar thick liquids to orally transit solids (up to 3 minutes).   He will require strict aspiration precautions and RN that has been working with him is working tomorrow thus she advised she would monitor closely. Advised patient to recommendation for dietary advancement to which he approved.   SLP will follow-up Friday and monitor closely as patient benefited from moderate cueing to conduct timely mastication, take small bites on the left side of his oral cavity. Will advance diet to dysphagia 3 and nectar.   Using teach back patient educated to plan.  Advised RN that patient will be a laborious feeder and will require extra time but goal is to improve his nutrition.  She was agreeable. Thank you!   HPI HPI: 77 yo male adm with AMS, foul urine odor and dyspnea.  Pt is a  resident of [address].  He experienced an aspiration episode yesterday with medications with applesauce and then experienced fever and respiratory difficulties requiring Bipap. Pt has h/o falls, dementia, Afib, CHF. CXR  06/17/2023 Patchy opacities throughout the lungs bilaterally, predominantly dependent, which are concerning for aspiration pneumonia/pneumonitis given the clinical concern for aspiration. 2. Small bilateral pleural effusions (right greater than left). 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 MBS completed on 10/19. Patient on dys 1 (puree) and honey thick liquids. Reeval completed clinically 11/1, and pt is charged to dys1/nectar liquids. GI initiated full liqiuid diet on 11/04 due to chronic illeus.  SLP following up regarding swallow function in hopes to get patient's diet advanced as appropriate given poor intake.      SLP Plan  Continue with current plan of care      Recommendations for follow up therapy are one component of a multi-disciplinary discharge planning process, led by the attending physician.  Recommendations may be updated based on patient status, additional functional criteria and insurance authorization.    Recommendations  Diet recommendations: Dysphagia 3 (mechanical soft);Nectar-thick liquid (Thin liquid okay via Provale cup) Liquids provided via: Cup;Straw Medication Administration:  (Attempt with ice cream as patient has a strong displeasure for applesauce at this time) Supervision: Patient able to self feed (Encourage patient to feed self with supervision) Compensations: Slow rate;Small sips/bites;Lingual sweep for clearance of pocketing;Other (Comment);Follow solids with liquid (Use liquid or pure to transit masticated solids) Postural Changes and/or Swallow Maneuvers: Seated upright 90 degrees;Upright 30-60 min after meal (Observe  laryngeal elevation to assure patient swallows)                  Oral  care QID   Frequent or constant Supervision/Assistance Dysphagia, oropharyngeal phase (R13.12)     Continue with current plan of care    [name], MS [facility] SLP Acute Rehab Services Office [phone]  [name]  07/28/2023, 10:59 AM

## 2023-07-28 NOTE — Progress Notes (Signed)
PHARMACY - ANTICOAGULATION CONSULT NOTE  Pharmacy Consult for Lovenox Indication: atrial fibrillation  No Known Allergies  Patient Measurements: Height: 5\' 5"  (165.1 cm) Weight: 75 kg (165 lb 5.5 oz) IBW/kg (Calculated) : 61.5 Heparin Dosing Weight:   Vital Signs: Temp: 98 F (36.7 C) (11/27 0511) Temp Source: Oral (11/27 0511) BP: 136/81 (11/27 0511) Pulse Rate: 84 (11/27 0511)  Labs: Recent Labs    07/26/23 0328 07/26/23 0900 07/26/23 0900 07/26/23 1725 07/27/23 0331 07/28/23 0343  HGB  --  9.5*   < > 9.4* 9.3* 9.3*  HCT  --  30.4*   < > 29.4* 30.2* 29.2*  PLT  --  324  --   --  312 304  CREATININE 1.08  --   --   --  1.14 1.15   < > = values in this interval not displayed.    Estimated Creatinine Clearance: 50.1 mL/min (by C-G formula based on SCr of 1.15 mg/dL).   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   11/25 Lovenox held for blood in rectal tube.  Rectal tube dc'd, no more rectal bleeding.  Lovenox resumed 11/26 PM dose. Hg stable @ 9.3 PLT stable @ 304, SCr stabel @ 1.15 To transition back to Eliquis once soon.  Goal of Therapy:  Anti-Xa level 0.6-1 units/ml 4hrs after LMWH dose given Monitor platelets by anticoagulation protocol: Yes   Plan:  Continue on Lovenox 80 mg sq q12h Resume apixaban 5 mg po bid when appropriate   Herby Abraham, Pharm.D Use secure chat for questions 07/28/2023 12:15 PM

## 2023-07-28 NOTE — Plan of Care (Signed)
Problem: Education: Goal: Ability to describe self-care measures that may prevent or decrease complications (Diabetes Survival Skills Education) will improve Outcome: Progressing Goal: Individualized Educational Video(s) Outcome: Progressing   Problem: Coping: Goal: Ability to adjust to condition or change in health will improve Outcome: Progressing   Problem: Fluid Volume: Goal: Ability to maintain a balanced intake and output will improve Outcome: Progressing   Problem: Health Behavior/Discharge Planning: Goal: Ability to identify and utilize available resources and services will improve Outcome: Progressing Goal: Ability to manage health-related needs will improve Outcome: Progressing   Problem: Metabolic: Goal: Ability to maintain appropriate glucose levels will improve Outcome: Progressing   Problem: Nutritional: Goal: Maintenance of adequate nutrition will improve Outcome: Progressing Goal: Progress toward achieving an optimal weight will improve Outcome: Progressing   Problem: Skin Integrity: Goal: Risk for impaired skin integrity will decrease Outcome: Progressing   Problem: Tissue Perfusion: Goal: Adequacy of tissue perfusion will improve Outcome: Progressing   Problem: Education: Goal: Knowledge of General Education information will improve Description: Including pain rating scale, medication(s)/side effects and non-pharmacologic comfort measures Outcome: Progressing   Problem: Health Behavior/Discharge Planning: Goal: Ability to manage health-related needs will improve Outcome: Progressing   Problem: Clinical Measurements: Goal: Ability to maintain clinical measurements within normal limits will improve Outcome: Progressing Goal: Will remain free from infection Outcome: Progressing Goal: Diagnostic test results will improve Outcome: Progressing Goal: Respiratory complications will improve Outcome: Progressing Goal: Cardiovascular complication will  be avoided Outcome: Progressing   Problem: Activity: Goal: Risk for activity intolerance will decrease Outcome: Progressing   Problem: Nutrition: Goal: Adequate nutrition will be maintained Outcome: Progressing   Problem: Coping: Goal: Level of anxiety will decrease Outcome: Progressing   Problem: Elimination: Goal: Will not experience complications related to bowel motility Outcome: Progressing Goal: Will not experience complications related to urinary retention Outcome: Progressing   Problem: Pain Managment: Goal: General experience of comfort will improve Outcome: Progressing   Problem: Safety: Goal: Ability to remain free from injury will improve Outcome: Progressing   Problem: Skin Integrity: Goal: Risk for impaired skin integrity will decrease Outcome: Progressing   Problem: Fluid Volume: Goal: Hemodynamic stability will improve Outcome: Progressing   Problem: Clinical Measurements: Goal: Diagnostic test results will improve Outcome: Progressing Goal: Signs and symptoms of infection will decrease Outcome: Progressing   Problem: Respiratory: Goal: Ability to maintain adequate ventilation will improve Outcome: Progressing   Problem: Education: Goal: Ability to describe self-care measures that may prevent or decrease complications (Diabetes Survival Skills Education) will improve Outcome: Progressing Goal: Individualized Educational Video(s) Outcome: Progressing   Problem: Cardiac: Goal: Ability to maintain an adequate cardiac output will improve Outcome: Progressing   Problem: Health Behavior/Discharge Planning: Goal: Ability to identify and utilize available resources and services will improve Outcome: Progressing Goal: Ability to manage health-related needs will improve Outcome: Progressing   Problem: Fluid Volume: Goal: Ability to achieve a balanced intake and output will improve Outcome: Progressing   Problem: Metabolic: Goal: Ability to  maintain appropriate glucose levels will improve Outcome: Progressing   Problem: Nutritional: Goal: Maintenance of adequate nutrition will improve Outcome: Progressing Goal: Maintenance of adequate weight for body size and type will improve Outcome: Progressing   Problem: Respiratory: Goal: Will regain and/or maintain adequate ventilation Outcome: Progressing   Problem: Urinary Elimination: Goal: Ability to achieve and maintain adequate renal perfusion and functioning will improve Outcome: Progressing   Problem: Education: Goal: Knowledge of disease or condition will improve Outcome: Progressing Goal: Knowledge of secondary prevention will improve (  MUST DOCUMENT ALL) Outcome: Progressing Goal: Knowledge of patient specific risk factors will improve Loraine Leriche N/A or DELETE if not current risk factor) Outcome: Progressing

## 2023-07-28 NOTE — Plan of Care (Signed)
Problem: Education: Goal: Ability to describe self-care measures that may prevent or decrease complications (Diabetes Survival Skills Education) will improve Outcome: Progressing Goal: Individualized Educational Video(s) Outcome: Progressing   Problem: Coping: Goal: Ability to adjust to condition or change in health will improve Outcome: Progressing   Problem: Fluid Volume: Goal: Ability to maintain a balanced intake and output will improve Outcome: Progressing   Problem: Health Behavior/Discharge Planning: Goal: Ability to identify and utilize available resources and services will improve Outcome: Progressing Goal: Ability to manage health-related needs will improve Outcome: Progressing   Problem: Metabolic: Goal: Ability to maintain appropriate glucose levels will improve Outcome: Progressing   Problem: Nutritional: Goal: Maintenance of adequate nutrition will improve Outcome: Progressing Goal: Progress toward achieving an optimal weight will improve Outcome: Progressing   Problem: Skin Integrity: Goal: Risk for impaired skin integrity will decrease Outcome: Progressing   Problem: Tissue Perfusion: Goal: Adequacy of tissue perfusion will improve Outcome: Progressing   Problem: Education: Goal: Knowledge of General Education information will improve Description: Including pain rating scale, medication(s)/side effects and non-pharmacologic comfort measures Outcome: Progressing   Problem: Health Behavior/Discharge Planning: Goal: Ability to manage health-related needs will improve Outcome: Progressing   Problem: Clinical Measurements: Goal: Ability to maintain clinical measurements within normal limits will improve Outcome: Progressing Goal: Will remain free from infection Outcome: Progressing Goal: Diagnostic test results will improve Outcome: Progressing Goal: Respiratory complications will improve Outcome: Progressing Goal: Cardiovascular complication will  be avoided Outcome: Progressing   Problem: Activity: Goal: Risk for activity intolerance will decrease Outcome: Progressing   Problem: Nutrition: Goal: Adequate nutrition will be maintained Outcome: Progressing   Problem: Coping: Goal: Level of anxiety will decrease Outcome: Progressing   Problem: Elimination: Goal: Will not experience complications related to bowel motility Outcome: Progressing Goal: Will not experience complications related to urinary retention Outcome: Progressing   Problem: Pain Managment: Goal: General experience of comfort will improve Outcome: Progressing   Problem: Safety: Goal: Ability to remain free from injury will improve Outcome: Progressing   Problem: Skin Integrity: Goal: Risk for impaired skin integrity will decrease Outcome: Progressing   Problem: Fluid Volume: Goal: Hemodynamic stability will improve Outcome: Progressing   Problem: Clinical Measurements: Goal: Diagnostic test results will improve Outcome: Progressing Goal: Signs and symptoms of infection will decrease Outcome: Progressing   Problem: Respiratory: Goal: Ability to maintain adequate ventilation will improve Outcome: Progressing   Problem: Education: Goal: Ability to describe self-care measures that may prevent or decrease complications (Diabetes Survival Skills Education) will improve Outcome: Progressing Goal: Individualized Educational Video(s) Outcome: Progressing   Problem: Cardiac: Goal: Ability to maintain an adequate cardiac output will improve Outcome: Progressing   Problem: Health Behavior/Discharge Planning: Goal: Ability to identify and utilize available resources and services will improve Outcome: Progressing Goal: Ability to manage health-related needs will improve Outcome: Progressing   Problem: Fluid Volume: Goal: Ability to achieve a balanced intake and output will improve Outcome: Progressing   Problem: Metabolic: Goal: Ability to  maintain appropriate glucose levels will improve Outcome: Progressing   Problem: Nutritional: Goal: Maintenance of adequate nutrition will improve Outcome: Progressing Goal: Maintenance of adequate weight for body size and type will improve Outcome: Progressing   Problem: Respiratory: Goal: Will regain and/or maintain adequate ventilation Outcome: Progressing   Problem: Urinary Elimination: Goal: Ability to achieve and maintain adequate renal perfusion and functioning will improve Outcome: Progressing   Problem: Education: Goal: Knowledge of disease or condition will improve Outcome: Progressing Goal: Knowledge of secondary prevention will improve (  MUST DOCUMENT ALL) Outcome: Progressing Goal: Knowledge of patient specific risk factors will improve Loraine Leriche N/A or DELETE if not current risk factor) Outcome: Progressing   Problem: Ischemic Stroke/TIA Tissue Perfusion: Goal: Complications of ischemic stroke/TIA will be minimized Outcome: Progressing   Problem: Coping: Goal: Will verbalize positive feelings about self Outcome: Progressing Goal: Will identify appropriate support needs Outcome: Progressing   Problem: Health Behavior/Discharge Planning: Goal: Ability to manage health-related needs will improve Outcome: Progressing Goal: Goals will be collaboratively established with patient/family Outcome: Progressing   Problem: Self-Care: Goal: Ability to participate in self-care as condition permits will improve Outcome: Progressing Goal: Verbalization of feelings and concerns over difficulty with self-care will improve Outcome: Progressing Goal: Ability to communicate needs accurately will improve Outcome: Progressing   Problem: Nutrition: Goal: Risk of aspiration will decrease Outcome: Progressing Goal: Dietary intake will improve Outcome: Progressing

## 2023-07-28 NOTE — TOC Progression Note (Signed)
Transition of Care [facility]) - Progression Note    Patient Details  Name: Eric Zamora MRN: [id number] Date of Birth: June 27, 1946  Transition of Care [facility]) CM/SW Contact  Otelia Santee, LCSW Phone Number: 07/28/2023, 10:18 AM  Clinical Narrative:    Pt expected to be medically ready to transfer to SNF within the next 1-2 days. Per [facility] they do not feel comfortable with admitting pt to their facility on a holiday weekend due to the extent of pt's illness and medical issues. Per Rev[facility] Standard they would be able to accept pt on Monday.    Expected Discharge Plan: Skilled Nursing Facility Barriers to Discharge: SNF Pending bed offer, Inadequate or no insurance  Expected Discharge Plan and Services     Post Acute Care Choice: Skilled Nursing Facility Living arrangements for the past 2 months: Assisted Living Facility [facility])                                       Social Determinants of Health (SDOH) Interventions SDOH Screenings   Food Insecurity: Patient Unable To Answer (06/15/2023)  Housing: Low Risk  (06/15/2023)  Transportation Needs: Patient Unable To Answer (06/15/2023)  Utilities: Patient Unable To Answer (06/15/2023)  Tobacco Use: Low Risk  (07/05/2023)    Readmission Risk Interventions    06/25/2023    2:17 PM  Readmission Risk Prevention Plan  Transportation Screening Complete  PCP or Specialist Appt within 5-7 Days Complete  Home Care Screening Complete  Medication Review (RN CM) Complete

## 2023-07-28 NOTE — Progress Notes (Signed)
Physical Therapy Treatment Patient Details Name: Eric Zamora MRN: [id number] DOB: [birth year] Today's Date: 07/28/2023   History of Present Illness Pt is 77 yo admitted with severe sepsis due to perirectal abscess.  Pt to OR on 10/18 for drainage of abscess.  Pt with increased lethargy on 10/24 , MRI Head performed and pt found to have acute infarct  right frontal operculum with moderate associated cytotoxic edema and petechial hemorrhage. Pt with new onset Afib RVR during admission. Pt found to have ileus 07/06/23 and GI consulted.   Pt with frequent loose stools/diarrhea and has had flexiseal.  Flexiseal was discontinued 11/25 due to bright red blood.  PMH: bipolar disorder, dementia, history of NSTEMI, hypertrophic cardiomyopathy    PT Comments   Pt admitted with above diagnosis.  Pt currently with functional limitations due to the deficits listed below (see PT Problem List).  Pt in bed when PT arrived. Pt agreeable to therapy intervention. Pt requires increased time and multimodal cues for motor processing and planing. Min A for supine to sit, mod A to complete scoot in sitting to EOB, mod A x 2 for sit to stand  from EOB, pt tolerated 1 min static assisted standing with B UE support at [facility] for assist with hygiene tasks. Pt required min A x 2 for safety and assist for gait tasks 8 feet with RW close behind. PT noted R lateral lean in bed, seated EOB and when in recliner. No reports of dizziness, SOB or pain during the tx session. Pt left in recliner and all needs in place. Pt will benefit from acute skilled PT to increase their independence and safety with mobility to allow discharge.      If plan is discharge home, recommend the following: Two people to help with walking and/or transfers;Two people to help with bathing/dressing/bathroom;Assistance with cooking/housework;Direct supervision/assist for financial management;Assist for transportation;Help with stairs or ramp for entrance   Can travel  by private vehicle     No  Equipment Recommendations  None recommended by PT    Recommendations for Other Services       Precautions / Restrictions Precautions Precautions: Fall Precaution Comments: monitor HR Restrictions Weight Bearing Restrictions: No     Mobility  Bed Mobility Overal bed mobility: Needs Assistance Bed Mobility: Supine to Sit     Supine to sit: Min assist, HOB elevated     General bed mobility comments: pt required min A for B LE and trunk with increased time and multimodal cues. pt required mod A for scooting to EOB in sitting with cues for weight shifting and anterior movement.    Transfers Overall transfer level: Needs assistance Equipment used: Rolling walker (2 wheels) Transfers: Sit to/from Stand Sit to Stand: Mod assist, +2 physical assistance, +2 safety/equipment           General transfer comment: pt required mod A x 2, cues for proper UE placement and assist to power up once in standing cues for extension posture and proper B LE foot palcement with pt demonstrating narrow BOS.    Ambulation/Gait Ambulation/Gait assistance: +2 safety/equipment, Min assist, +2 physical assistance Gait Distance (Feet): 8 Feet Assistive device: Rolling walker (2 wheels) Gait Pattern/deviations: Step-to pattern, Narrow base of support Gait velocity: decreased     General Gait Details: flexed posture, very narrow BOS, cues for attention, posture, proper UE placement wtih pt able to widen BOS with constant cues, encoruagement  for gait tasks today with pt reporting fatigue recliner close.  Stairs             Wheelchair Mobility     Tilt Bed    Modified Rankin (Stroke Patients Only)       Balance Overall balance assessment: Needs assistance Sitting-balance support: No upper extremity supported, Feet supported Sitting balance-Leahy Scale: Fair Sitting balance - Comments: Able to sit EOB with supervision, noted R lateral lean seated EOB  and once in recliner   Standing balance support: Bilateral upper extremity supported Standing balance-Leahy Scale: Poor Standing balance comment: RW and min A wtih slight posterior and R lateral lean                            Cognition Arousal: Alert Behavior During Therapy: WFL for tasks assessed/performed Overall Cognitive Status: No family/caregiver present to determine baseline cognitive functioning                                 General Comments: alert, cooperative, able to follow simple commands with slightly increased time and occasional repetition of prompts        Exercises      General Comments General comments (skin integrity, edema, etc.): HR elevated to 160s      Pertinent Vitals/Pain Pain Assessment Pain Assessment: No/denies pain Faces Pain Scale: No hurt Breathing: normal Negative Vocalization: none Facial Expression: smiling or inexpressive Body Language: relaxed Consolability: no need to console PAINAD Score: 0    Home Living                          Prior Function            PT Goals (current goals can now be found in the care plan section) Acute Rehab PT Goals Patient Stated Goal: none stated PT Goal Formulation: Patient unable to participate in goal setting Time For Goal Achievement: 07/24/23 Potential to Achieve Goals: Fair Progress towards PT goals: Progressing toward goals    Frequency    Min 1X/week      PT Plan      Co-evaluation              AM-PAC PT "6 Clicks" Mobility   Outcome Measure  Help needed turning from your back to your side while in a flat bed without using bedrails?: A Lot Help needed moving from lying on your back to sitting on the side of a flat bed without using bedrails?: A Lot Help needed moving to and from a bed to a chair (including a wheelchair)?: A Lot Help needed standing up from a chair using your arms (e.g., wheelchair or bedside chair)?: A Lot Help  needed to walk in hospital room?: A Lot Help needed climbing 3-5 steps with a railing? : Total 6 Click Score: 11    End of Session Equipment Utilized During Treatment: Gait belt Activity Tolerance: Patient tolerated treatment well Patient left: with call bell/phone within reach;in chair;with chair alarm set Nurse Communication: Mobility status PT Visit Diagnosis: Other abnormalities of gait and mobility (R26.89);Muscle weakness (generalized) (M62.81)     Time: 4782-9562 PT Time Calculation (min) (ACUTE ONLY): 16 min  Charges:    $Therapeutic Activity: 8-22 mins PT General Charges $$ ACUTE PT VISIT: 1 Visit                     Johnny Bridge, PT Acute Rehab  Jacqualyn Posey 07/28/2023, 4:30 PM

## 2023-07-28 NOTE — Progress Notes (Signed)
  Progress Note   Patient: Eric Zamora [id number]:[id number] DOB: 03-Aug-1946 DOA: 06/15/2023     43 DOS: the patient was seen and examined on 07/28/2023 at 9:03AM      Brief hospital course: 77 y.o. 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   10/15 admitted to stepdown unit. 10/17: Overnight with fever, aspiration event, new AG acidosis, transferred back to SDU for BiPAP and insulin drip 10/18: taken to OR for drainage of abscess 10/23 cardiology was consulted for new onset A-fib with RVR. 10/24 nephrology was consulted for AKI and hyponatremia.  CT head was obtained for ongoing AMS and was found to have acute/subacute right MCA territory infarct.  Neurology was also consulted. 10/25.  Palliative care was consulted. 10/31.  Anticoagulation started again. 11/1.  Second dose of Bicillin given.  Underwent MBS. 11/2.  Marinol started for poor p.o. intake. 11/4.  Found to have ileus.  GI consulted.  Recommended DNR to legal guardian. 11/5.  Two-physician DNR form was signed and sent to guardian.  Will await decision. Subsequently developed colitis.  Significant diarrhea noted.  GI was consulted.  C. difficile and GI pathogen panel testing were negative.     Assessment and Plan: * Severe sepsis due to perirectal abscess(HCC) Antibiotics completed     Rectal bleeding This appears to have been largely from the rectal tube.  Rectal tube was discontinued a day ago and Lovenox was held for a day and the bleeding subsided.  Hemoglobin now stable. - Resume apixaban and monitor hemoglobin and rectal bleeding  Acute stroke -Resume apixaban - Continue simvastatin  Diarrhea Improved - Continue cholestyramine and imodium   New onset atrial fibrillation Rates now controlled - Continue metoprolol - Resume Eliquis  Hypokalemia -  Supplement K  Depression - Continue Paxil  Unclear history of seizures Only reported seizure activity from ALF staff, no prior history.  Prior neurology evaluation in 2022 for seizure-like episodes and was considered to have dementia and falls only, not seizures. - Stop Keppra           Subjective: No new complaints, nursing have no concerns.  No further bleeding.  No fever.  Paitent is a very limited historian.     Physical Exam: BP (!) 140/72 (BP Location: Left Arm)   Pulse 84   Temp 98.7 F (37.1 C) (Oral)   Resp 20   Ht 5\' 5"  (1.651 m)   Wt 75 kg   SpO2 100%   BMI 27.51 kg/m   Elderly adult male, lying in bed, eyes open, but staring at the ceiling, makes eye contact briefly, then closes his eyes Irregularly irregular, no murmurs, no pitting edema Respiratory rate normal, lung sounds diminished, no rales or wheezing appreciated, limited exam due to patient cooperation Abdomen soft no tenderness palpation or guarding, no ascites or distention Lying in bed, opens eyes, makes eye contact, then closes eyes again.  Oriented to self, only.  Speech dysarthric but unchanged from previous.  Does not cooperate well with exam, strength symmetric   Data Reviewed: Patient metabolic panel shows hypokalemia CBC shows anemia, stable      Disposition: Status is: Inpatient         Author: Alberteen Sam, MD 07/28/2023 3:50 PM  For on call review [email or web address].

## 2023-07-29 DIAGNOSIS — N179 Acute kidney failure, unspecified: Secondary | ICD-10-CM | POA: Diagnosis not present

## 2023-07-29 DIAGNOSIS — I4891 Unspecified atrial fibrillation: Secondary | ICD-10-CM | POA: Diagnosis not present

## 2023-07-29 DIAGNOSIS — E1110 Type 2 diabetes mellitus with ketoacidosis without coma: Secondary | ICD-10-CM | POA: Diagnosis not present

## 2023-07-29 DIAGNOSIS — A419 Sepsis, unspecified organism: Secondary | ICD-10-CM | POA: Diagnosis not present

## 2023-07-29 LAB — GLUCOSE, CAPILLARY
Glucose-Capillary: 143 mg/dL — ABNORMAL HIGH (ref 70–99)
Glucose-Capillary: 145 mg/dL — ABNORMAL HIGH (ref 70–99)
Glucose-Capillary: 112 mg/dL — ABNORMAL HIGH (ref 70–99)
Glucose-Capillary: 129 mg/dL — ABNORMAL HIGH (ref 70–99)

## 2023-07-29 LAB — CBC
HCT: 30.9 % — ABNORMAL LOW (ref 39.0–52.0)
Hemoglobin: 9.6 g/dL — ABNORMAL LOW (ref 13.0–17.0)
MCH: 28.1 pg (ref 26.0–34.0)
MCHC: 31.1 g/dL (ref 30.0–36.0)
MCV: 90.4 fL (ref 80.0–100.0)
Platelets: 326 10*3/uL (ref 150–400)
RBC: 3.42 MIL/uL — ABNORMAL LOW (ref 4.22–5.81)
RDW: 15.1 % (ref 11.5–15.5)
WBC: 7.3 10*3/uL (ref 4.0–10.5)
nRBC: 0 % (ref 0.0–0.2)

## 2023-07-29 LAB — COMPREHENSIVE METABOLIC PANEL
ALT: 18 U/L (ref 0–44)
AST: 23 U/L (ref 15–41)
Albumin: 2.7 g/dL — ABNORMAL LOW (ref 3.5–5.0)
Alkaline Phosphatase: 30 U/L — ABNORMAL LOW (ref 38–126)
Anion gap: 7 (ref 5–15)
BUN: 5 mg/dL — ABNORMAL LOW (ref 8–23)
CO2: 22 mmol/L (ref 22–32)
Calcium: 7.9 mg/dL — ABNORMAL LOW (ref 8.9–10.3)
Chloride: 108 mmol/L (ref 98–111)
Creatinine, Ser: 1.12 mg/dL (ref 0.61–1.24)
GFR, Estimated: >60 mL/min (ref >60)
Glucose, Bld: 116 mg/dL — ABNORMAL HIGH (ref 70–99)
Potassium: 3.2 mmol/L — ABNORMAL LOW (ref 3.5–5.1)
Sodium: 137 mmol/L (ref 135–145)
Total Bilirubin: 0.5 mg/dL (ref <1.2)
Total Protein: 7.1 g/dL (ref 6.5–8.1)

## 2023-07-29 MED ORDER — METOPROLOL TARTRATE 50 MG PO TABS
50.0000 mg | ORAL_TABLET | Freq: Two times a day (BID) | ORAL | Status: DC
  Administered 2023-07-29 – 2023-08-02 (×9): 50 mg via ORAL
  Filled 2023-07-29 (×9): qty 1

## 2023-07-29 MED ORDER — POTASSIUM CHLORIDE CRYS ER 20 MEQ PO TBCR
40.0000 meq | EXTENDED_RELEASE_TABLET | Freq: Three times a day (TID) | ORAL | Status: AC
  Administered 2023-07-29 (×3): 40 meq via ORAL
  Filled 2023-07-29 (×3): qty 2

## 2023-07-29 NOTE — Plan of Care (Signed)
Problem: Education: Goal: Ability to describe self-care measures that may prevent or decrease complications (Diabetes Survival Skills Education) will improve Outcome: Progressing Goal: Individualized Educational Video(s) Outcome: Progressing   Problem: Coping: Goal: Ability to adjust to condition or change in health will improve Outcome: Progressing   Problem: Fluid Volume: Goal: Ability to maintain a balanced intake and output will improve Outcome: Progressing   Problem: Health Behavior/Discharge Planning: Goal: Ability to identify and utilize available resources and services will improve Outcome: Progressing Goal: Ability to manage health-related needs will improve Outcome: Progressing   Problem: Metabolic: Goal: Ability to maintain appropriate glucose levels will improve Outcome: Progressing   Problem: Nutritional: Goal: Maintenance of adequate nutrition will improve Outcome: Progressing Goal: Progress toward achieving an optimal weight will improve Outcome: Progressing   Problem: Skin Integrity: Goal: Risk for impaired skin integrity will decrease Outcome: Progressing   Problem: Tissue Perfusion: Goal: Adequacy of tissue perfusion will improve Outcome: Progressing   Problem: Education: Goal: Knowledge of General Education information will improve Description: Including pain rating scale, medication(s)/side effects and non-pharmacologic comfort measures Outcome: Progressing   Problem: Health Behavior/Discharge Planning: Goal: Ability to manage health-related needs will improve Outcome: Progressing   Problem: Clinical Measurements: Goal: Ability to maintain clinical measurements within normal limits will improve Outcome: Progressing Goal: Will remain free from infection Outcome: Progressing Goal: Diagnostic test results will improve Outcome: Progressing Goal: Respiratory complications will improve Outcome: Progressing Goal: Cardiovascular complication will  be avoided Outcome: Progressing   Problem: Activity: Goal: Risk for activity intolerance will decrease Outcome: Progressing   Problem: Nutrition: Goal: Adequate nutrition will be maintained Outcome: Progressing   Problem: Coping: Goal: Level of anxiety will decrease Outcome: Progressing   Problem: Elimination: Goal: Will not experience complications related to bowel motility Outcome: Progressing Goal: Will not experience complications related to urinary retention Outcome: Progressing   Problem: Pain Managment: Goal: General experience of comfort will improve Outcome: Progressing   Problem: Safety: Goal: Ability to remain free from injury will improve Outcome: Progressing   Problem: Skin Integrity: Goal: Risk for impaired skin integrity will decrease Outcome: Progressing   Problem: Fluid Volume: Goal: Hemodynamic stability will improve Outcome: Progressing   Problem: Clinical Measurements: Goal: Diagnostic test results will improve Outcome: Progressing Goal: Signs and symptoms of infection will decrease Outcome: Progressing   Problem: Respiratory: Goal: Ability to maintain adequate ventilation will improve Outcome: Progressing   Problem: Education: Goal: Ability to describe self-care measures that may prevent or decrease complications (Diabetes Survival Skills Education) will improve Outcome: Progressing Goal: Individualized Educational Video(s) Outcome: Progressing   Problem: Cardiac: Goal: Ability to maintain an adequate cardiac output will improve Outcome: Progressing   Problem: Health Behavior/Discharge Planning: Goal: Ability to identify and utilize available resources and services will improve Outcome: Progressing Goal: Ability to manage health-related needs will improve Outcome: Progressing   Problem: Fluid Volume: Goal: Ability to achieve a balanced intake and output will improve Outcome: Progressing   Problem: Metabolic: Goal: Ability to  maintain appropriate glucose levels will improve Outcome: Progressing   Problem: Nutritional: Goal: Maintenance of adequate nutrition will improve Outcome: Progressing Goal: Maintenance of adequate weight for body size and type will improve Outcome: Progressing   Problem: Respiratory: Goal: Will regain and/or maintain adequate ventilation Outcome: Progressing   Problem: Urinary Elimination: Goal: Ability to achieve and maintain adequate renal perfusion and functioning will improve Outcome: Progressing   Problem: Education: Goal: Knowledge of disease or condition will improve Outcome: Progressing Goal: Knowledge of secondary prevention will improve (  MUST DOCUMENT ALL) Outcome: Progressing Goal: Knowledge of patient specific risk factors will improve Loraine Leriche N/A or DELETE if not current risk factor) Outcome: Progressing   Problem: Ischemic Stroke/TIA Tissue Perfusion: Goal: Complications of ischemic stroke/TIA will be minimized Outcome: Progressing   Problem: Coping: Goal: Will verbalize positive feelings about self Outcome: Progressing Goal: Will identify appropriate support needs Outcome: Progressing   Problem: Health Behavior/Discharge Planning: Goal: Ability to manage health-related needs will improve Outcome: Progressing Goal: Goals will be collaboratively established with patient/family Outcome: Progressing   Problem: Self-Care: Goal: Ability to participate in self-care as condition permits will improve Outcome: Progressing Goal: Verbalization of feelings and concerns over difficulty with self-care will improve Outcome: Progressing Goal: Ability to communicate needs accurately will improve Outcome: Progressing   Problem: Nutrition: Goal: Risk of aspiration will decrease Outcome: Progressing Goal: Dietary intake will improve Outcome: Progressing

## 2023-07-29 NOTE — Progress Notes (Signed)
  Progress Note   Patient: Eric Zamora [id number]:[id number] DOB: [birth year] DOA: 06/15/2023     44 DOS: the patient was seen and examined on 07/29/2023        Brief hospital course: 77 y.o. 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   10/15 admitted to stepdown unit. 10/17: Overnight with fever, aspiration event, new AG acidosis, transferred back to SDU for BiPAP and insulin drip 10/18: taken to OR for drainage of abscess 10/23 cardiology was consulted for new onset A-fib with RVR. 10/24 nephrology was consulted for AKI and hyponatremia.  CT head was obtained for ongoing AMS and was found to have acute/subacute right MCA territory infarct.  Neurology was also consulted. 10/25.  Palliative care was consulted. 10/31.  Anticoagulation started again. 11/1.  Second dose of Bicillin given.  Underwent MBS. 11/2.  Marinol started for poor p.o. intake. 11/4.  Found to have ileus.  GI consulted.  Recommended DNR to legal guardian. 11/5.  Two-physician DNR form was signed and sent to guardian.  Will await decision. Subsequently developed colitis.  Significant diarrhea noted.  GI was consulted.  C. difficile and GI pathogen panel testing were negative.     Assessment and Plan: * Severe sepsis due to perirectal abscess(HCC) Antibiotics completed     Rectal bleeding No bleeding - Continue apixaban  Acute stroke - Continue apixaban - Continue simvastatin  Diarrhea Improved - Continue cholestyramine and imodium   New onset atrial fibrillation Rates now controlled - Continue metoprolol - Cotnineu Eliquis  Hypokalemia - Supplement K  Depression - Continue Paxil  Unclear history of seizures             Subjective: No new complaints, nursing have no concerns.  No further bleeding.  No fever.  Paitent is a very  limited historian.     Physical Exam: BP 134/80 (BP Location: Left Arm)   Pulse 67   Temp 98.4 F (36.9 C) (Oral)   Resp 16   Ht 5\' 5"  (1.651 m)   Wt 74.9 kg   SpO2 98%   BMI 27.48 kg/m   Elderly adult male, lying in bed, eyes open, but staring at the ceiling, makes eye contact briefly, then closes his eyes Irregularly irregular, no murmurs, no pitting edema Respiratory rate normal, lung sounds diminished, no rales or wheezing appreciated, limited exam due to patient cooperation Abdomen soft no tenderness palpation or guarding, no ascites or distention Lying in bed, opens eyes, makes eye contact, then closes eyes again.  Oriented to self, only.  Speech dysarthric but unchanged from previous.  Does not cooperate well with exam, strength symmetric   Data Reviewed: Anemia stable      Disposition: Status is: Inpatient         Author: Alberteen Sam, MD 07/29/2023 12:56 PM  For on call review [email or web address].

## 2023-07-29 NOTE — Plan of Care (Signed)
Problem: Health Behavior/Discharge Planning: Goal: Ability to manage health-related needs will improve Outcome: Progressing   Problem: Metabolic: Goal: Ability to maintain appropriate glucose levels will improve Outcome: Progressing

## 2023-07-30 DIAGNOSIS — N179 Acute kidney failure, unspecified: Secondary | ICD-10-CM | POA: Diagnosis not present

## 2023-07-30 DIAGNOSIS — I4891 Unspecified atrial fibrillation: Secondary | ICD-10-CM | POA: Diagnosis not present

## 2023-07-30 DIAGNOSIS — E1110 Type 2 diabetes mellitus with ketoacidosis without coma: Secondary | ICD-10-CM | POA: Diagnosis not present

## 2023-07-30 DIAGNOSIS — A419 Sepsis, unspecified organism: Secondary | ICD-10-CM | POA: Diagnosis not present

## 2023-07-30 LAB — CBC
HCT: 32.7 % — ABNORMAL LOW (ref 39.0–52.0)
Hemoglobin: 10 g/dL — ABNORMAL LOW (ref 13.0–17.0)
MCH: 27.7 pg (ref 26.0–34.0)
MCHC: 30.6 g/dL (ref 30.0–36.0)
MCV: 90.6 fL (ref 80.0–100.0)
Platelets: 338 10*3/uL (ref 150–400)
RBC: 3.61 MIL/uL — ABNORMAL LOW (ref 4.22–5.81)
RDW: 15.2 % (ref 11.5–15.5)
WBC: 7.4 10*3/uL (ref 4.0–10.5)
nRBC: 0 % (ref 0.0–0.2)

## 2023-07-30 LAB — COMPREHENSIVE METABOLIC PANEL
ALT: 17 U/L (ref 0–44)
AST: 22 U/L (ref 15–41)
Albumin: 2.9 g/dL — ABNORMAL LOW (ref 3.5–5.0)
Alkaline Phosphatase: 32 U/L — ABNORMAL LOW (ref 38–126)
Anion gap: 7 (ref 5–15)
BUN: 7 mg/dL — ABNORMAL LOW (ref 8–23)
CO2: 22 mmol/L (ref 22–32)
Calcium: 8.3 mg/dL — ABNORMAL LOW (ref 8.9–10.3)
Chloride: 115 mmol/L — ABNORMAL HIGH (ref 98–111)
Creatinine, Ser: 1.1 mg/dL (ref 0.61–1.24)
GFR, Estimated: >60 mL/min (ref >60)
Glucose, Bld: 114 mg/dL — ABNORMAL HIGH (ref 70–99)
Potassium: 3.6 mmol/L (ref 3.5–5.1)
Sodium: 144 mmol/L (ref 135–145)
Total Bilirubin: 0.7 mg/dL (ref <1.2)
Total Protein: 7.1 g/dL (ref 6.5–8.1)

## 2023-07-30 LAB — GLUCOSE, CAPILLARY
Glucose-Capillary: 124 mg/dL — ABNORMAL HIGH (ref 70–99)
Glucose-Capillary: 148 mg/dL — ABNORMAL HIGH (ref 70–99)
Glucose-Capillary: 161 mg/dL — ABNORMAL HIGH (ref 70–99)
Glucose-Capillary: 119 mg/dL — ABNORMAL HIGH (ref 70–99)

## 2023-07-30 NOTE — Progress Notes (Signed)
Speech Language Pathology Treatment: Dysphagia  Patient Details Name: Eric Zamora MRN: [id number] DOB: Nov 30, 1945 Today's Date: 07/30/2023 Time: 7829-5621 SLP Time Calculation (min) (ACUTE ONLY): 34 min  Assessment / Plan / Recommendation Clinical Impression  Pt seen to address dysphagia goals including po tolerance with dietary advancement and education. Reviewed with (family member, [name], in room) to aspiration precautions, thickening liquids and use of Provale cup. Approx 25% of his meal was consumed by the time SLP arrived to room. Pt consumed cold thin water x3 boluses, nectar thick juice x4 ounces, and nectar soda x2 ounces without clinical indication of aspiration (*sensed aspiration on most recent MBS), suspect spontaneous improvement s/p CVA. Encouraged pt to continue to drink his liquids and eat as much as possible. Pt reports being happy with dietary advancement. Reintroduced pt prolonged mastication requiring puree to orally transit solids into pharynx. Pt is making excellenet progress re: swallowing. Will continue efforts - he may not require repeat MBS prior to liquid advancement given sensate aspiration. Moderate verbal cues required for education.     HPI HPI: 77 yo male adm with AMS, foul urine odor and dyspnea.  Pt is a resident of [address].  He experienced an aspiration episode yesterday with medications with applesauce and then experienced fever and respiratory difficulties requiring Bipap. Pt has h/o falls, dementia, Afib, CHF. CXR  06/17/2023 Patchy opacities throughout the lungs bilaterally, predominantly dependent, which are concerning for aspiration pneumonia/pneumonitis given the clinical concern for aspiration. 2. Small bilateral pleural effusions (right greater than left). 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 MBS completed on 10/19. Patient on dys 1 (puree) and honey thick liquids. Reeval completed clinically 11/1,  and pt is charged to dys1/nectar liquids. GI initiated full liqiuid diet on 11/04 due to chronic illeus.  SLP following up regarding swallow function in hopes to get patient's diet advanced as appropriate given poor intake.      SLP Plan  Continue with current plan of care      Recommendations for follow up therapy are one component of a multi-disciplinary discharge planning process, led by the attending physician.  Recommendations may be updated based on patient status, additional functional criteria and insurance authorization.    Recommendations  Diet recommendations: Dysphagia 3 (mechanical soft);Nectar-thick liquid Liquids provided via: Cup;Straw Medication Administration: Crushed with puree Supervision: Staff to assist with self feeding Compensations: Slow rate;Small sips/bites;Other (Comment) (use puree to transit masticated solids into pharynx) Postural Changes and/or Swallow Maneuvers: Seated upright 90 degrees;Upright 30-60 min after meal                  Oral care QID     Dysphagia, oropharyngeal phase (R13.12)     Continue with current plan of care   [name], MS [facility] SLP Acute Rehab Services Office [phone]   [name]  07/30/2023, 2:42 PM

## 2023-07-30 NOTE — Plan of Care (Signed)
  Problem: Education: Goal: Ability to describe self-care measures that may prevent or decrease complications (Diabetes Survival Skills Education) will improve Outcome: Progressing   Problem: Health Behavior/Discharge Planning: Goal: Ability to identify and utilize available resources and services will improve Outcome: Progressing Goal: Ability to manage health-related needs will improve Outcome: Progressing   Problem: Skin Integrity: Goal: Risk for impaired skin integrity will decrease Outcome: Progressing   Problem: Health Behavior/Discharge Planning: Goal: Ability to manage health-related needs will improve Outcome: Progressing   Problem: Clinical Measurements: Goal: Cardiovascular complication will be avoided Outcome: Progressing   Problem: Activity: Goal: Risk for activity intolerance will decrease Outcome: Progressing   Problem: Nutrition: Goal: Adequate nutrition will be maintained Outcome: Progressing   Problem: Coping: Goal: Level of anxiety will decrease Outcome: Progressing   Problem: Pain Managment: Goal: General experience of comfort will improve Outcome: Progressing   Problem: Safety: Goal: Ability to remain free from injury will improve Outcome: Progressing

## 2023-07-30 NOTE — Plan of Care (Signed)
  Problem: Metabolic: Goal: Ability to maintain appropriate glucose levels will improve Outcome: Progressing   Problem: Skin Integrity: Goal: Risk for impaired skin integrity will decrease Outcome: Progressing   Problem: Clinical Measurements: Goal: Ability to maintain clinical measurements within normal limits will improve Outcome: Progressing Goal: Will remain free from infection Outcome: Progressing   Problem: Pain Managment: Goal: General experience of comfort will improve Outcome: Progressing   Problem: Safety: Goal: Ability to remain free from injury will improve Outcome: Progressing   Problem: Ischemic Stroke/TIA Tissue Perfusion: Goal: Complications of ischemic stroke/TIA will be minimized Outcome: Progressing

## 2023-07-30 NOTE — Progress Notes (Signed)
  Progress Note   Patient: Eric Zamora [id number] DOB: 1946/05/06 DOA: 06/15/2023     45 DOS: the patient was seen and examined on 07/30/2023 at 10:57AM      Brief hospital course: 77 y.o. 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, CAD, dCHF, HTN, hx DVT no longer on [facility], and HLD who admitted on 10/15 for severe sepsis due to perirectal abscess.     Hospitalization complicated by stroke, DKA, aspiration pneumonia, new onset Afib, and diarrhea.     Assessment and Plan: * Severe sepsis due to perirectal abscess(HCC) Antibiotics completed     Rectal bleeding due to Flexi-Seal No bleeding observed, hemoglobin stable - Continue apixaban  Acute stroke -Continue apixaban and simvastatin  Diarrhea Occasionally still happening - Continue cholestyramine and Imodium  New onset atrial fibrillation Rates elevated yesterday, metop increased, better today - Continue metoprolol, apixaban  Hypokalemia Resolved  Depression - Continue Paxil  Unclear history of seizures Keppra stopped per Neurology           Subjective: Diarrhea is better than last week.  No new nursing concerns, no fever, no bleeding.  Patient is unable to provide independent history.     Physical Exam: BP 118/74 (BP Location: Left Arm)   Pulse 89   Temp 98.4 F (36.9 C) (Oral)   Resp 18   Ht 5\' 5"  (1.651 m)   Wt 73.8 kg   SpO2 100%   BMI 27.07 kg/m   Frail elderly adult, lying in bed, appears weak and debilitated Heart rate irregularly irregular, rate controlled, no murmurs, no pitting peripheral edema, although there is diffuse nonpitting edema Respiratory rate normal, lung sounds diminished, respiratory effort weak Abdomen soft, no grimace to palpation, no ascites or distention Lying in bed, makes eye contact, then closes his eyes again, oriented to self only, speech dysarthric but unchanged from before, able to lift both arms, but  severely generally weak.   Cannot lift legs against gravity.   Data Reviewed: Basic metabolic panel unremarkable CBC no change      Disposition: Status is: Inpatient         Author: Alberteen Sam, MD 07/30/2023 4:51 PM  For on call review [email or web address].

## 2023-07-30 NOTE — Progress Notes (Signed)
Physical Therapy Treatment Patient Details Name: Eric Zamora MRN: [id number] DOB: Dec 16, 1945 Today's Date: 07/30/2023   History of Present Illness Pt is 77 yo admitted with severe sepsis due to perirectal abscess.  Pt to OR on 10/18 for drainage of abscess.  Pt with increased lethargy on 10/24 , MRI Head performed and pt found to have acute infarct  right frontal operculum with moderate associated cytotoxic edema and petechial hemorrhage. Pt with new onset Afib RVR during admission. Pt found to have ileus 07/06/23 and GI consulted.   Pt with frequent loose stools/diarrhea and has had flexiseal.  Flexiseal was discontinued 11/25 due to bright red blood.  PMH: bipolar disorder, dementia, history of NSTEMI, hypertrophic cardiomyopathy    PT Comments  Pt limited today due to fatigue and incontinence/diarrhea.   He required increased assistance for balance with posterior/R lean.  Stood x 2 with max x 2 but unable to progress to walking/transfers due to needing to return to bed for cleaning after BM.  Continue POC. Patient will benefit from continued inpatient follow up therapy, <3 hours/day at d/c.     If plan is discharge home, recommend the following: Two people to help with walking and/or transfers;Two people to help with bathing/dressing/bathroom;Assistance with cooking/housework;Direct supervision/assist for financial management;Assist for transportation;Help with stairs or ramp for entrance   Can travel by private vehicle     No  Equipment Recommendations  None recommended by PT    Recommendations for Other Services       Precautions / Restrictions Precautions Precautions: Fall Precaution Comments: monitor HR     Mobility  Bed Mobility Overal bed mobility: Needs Assistance Bed Mobility: Rolling Rolling: Mod assist Sidelying to sit: Mod assist, Used rails, HOB elevated       General bed mobility comments: Increased time with multimodal cues for all.  Assisted pt with  positioning flexing LE and reaching across to rail to roll - mod A to complete.  Mod A for trunk and scooting forward to sit.  Rolling both direction x 2 for cleaning and transfers    Transfers Overall transfer level: Needs assistance Equipment used: Rolling walker (2 wheels) Transfers: Sit to/from Stand Sit to Stand: Max assist, +2 physical assistance           General transfer comment: increased assist today with R and posterior lean; worked balance prior to standing (see below); max x 2 to rise; STS x 2; not able to ambulate or tx to chair due to diarrhea and had to return to bed for cleaning.    Ambulation/Gait                   Stairs             Wheelchair Mobility     Tilt Bed    Modified Rankin (Stroke Patients Only) Modified Rankin (Stroke Patients Only) Pre-Morbid Rankin Score: Moderate disability Modified Rankin: Severe disability     Balance Overall balance assessment: Needs assistance Sitting-balance support: Feet supported, Bilateral upper extremity supported Sitting balance-Leahy Scale: Poor Sitting balance - Comments: Increased posterior and R lean today - could correct with cues but not maintain.  Tried reaching forward to targets, across body to targets, and bringing head forward to targe - could perform but then back to posterior/R lean.  Tried leaning on L elbow, but no carryover today.  Requiring mod A for balance     Standing balance-Leahy Scale: Poor Standing balance comment: Mod A x2 balance today  Cognition Arousal: Alert Behavior During Therapy: WFL for tasks assessed/performed Overall Cognitive Status: No family/caregiver present to determine baseline cognitive functioning                                 General Comments: alert, cooperative, able to follow simple commands with slightly increased time and occasional repetition of prompts        Exercises General Exercises  - Lower Extremity Ankle Circles/Pumps: AROM, 10 reps, Supine, Both Quad Sets: AROM, Both, 10 reps, Supine Heel Slides: Both, 10 reps, Supine, AROM Hip ABduction/ADduction: AROM, Both, Supine, 10 reps Straight Leg Raises: AROM, Both, Supine, 10 reps Other Exercises Other Exercises: multimodal cues for quad sets; cues to take time and full ROM with all exercises (tends to do partial and quick movements)    General Comments        Pertinent Vitals/Pain Pain Assessment Pain Assessment: No/denies pain    Home Living                          Prior Function            PT Goals (current goals can now be found in the care plan section) Acute Rehab PT Goals Time For Goal Achievement: 08/11/23 Progress towards PT goals: Progressing toward goals    Frequency    Min 1X/week      PT Plan      Co-evaluation              AM-PAC PT "6 Clicks" Mobility   Outcome Measure  Help needed turning from your back to your side while in a flat bed without using bedrails?: A Lot Help needed moving from lying on your back to sitting on the side of a flat bed without using bedrails?: A Lot Help needed moving to and from a bed to a chair (including a wheelchair)?: Total Help needed standing up from a chair using your arms (e.g., wheelchair or bedside chair)?: Total Help needed to walk in hospital room?: Total Help needed climbing 3-5 steps with a railing? : Total 6 Click Score: 8    End of Session Equipment Utilized During Treatment: Gait belt Activity Tolerance: Patient limited by fatigue (and diarrhea/incontinence) Patient left: with call bell/phone within reach;in bed;with bed alarm set Nurse Communication: Mobility status PT Visit Diagnosis: Other abnormalities of gait and mobility (R26.89);Muscle weakness (generalized) (M62.81)     Time: 1610-9604 PT Time Calculation (min) (ACUTE ONLY): 29 min  Charges:    $Therapeutic Exercise: 8-22 mins $Neuromuscular  Re-education: 8-22 mins PT General Charges $$ ACUTE PT VISIT: 1 Visit                     Anise Salvo, PT Acute Rehab [facility] [phone]    Rayetta Humphrey 07/30/2023, 12:51 PM

## 2023-07-31 DIAGNOSIS — I2583 Coronary atherosclerosis due to lipid rich plaque: Secondary | ICD-10-CM

## 2023-07-31 DIAGNOSIS — A419 Sepsis, unspecified organism: Secondary | ICD-10-CM | POA: Diagnosis not present

## 2023-07-31 DIAGNOSIS — I2510 Atherosclerotic heart disease of native coronary artery without angina pectoris: Secondary | ICD-10-CM

## 2023-07-31 DIAGNOSIS — N179 Acute kidney failure, unspecified: Secondary | ICD-10-CM

## 2023-07-31 DIAGNOSIS — I5032 Chronic diastolic (congestive) heart failure: Secondary | ICD-10-CM

## 2023-07-31 DIAGNOSIS — E1110 Type 2 diabetes mellitus with ketoacidosis without coma: Secondary | ICD-10-CM | POA: Diagnosis not present

## 2023-07-31 DIAGNOSIS — I4891 Unspecified atrial fibrillation: Secondary | ICD-10-CM

## 2023-07-31 DIAGNOSIS — E782 Mixed hyperlipidemia: Secondary | ICD-10-CM

## 2023-07-31 DIAGNOSIS — I10 Essential (primary) hypertension: Secondary | ICD-10-CM

## 2023-07-31 DIAGNOSIS — R6520 Severe sepsis without septic shock: Secondary | ICD-10-CM

## 2023-07-31 DIAGNOSIS — R1310 Dysphagia, unspecified: Secondary | ICD-10-CM

## 2023-07-31 DIAGNOSIS — N1832 Chronic kidney disease, stage 3b: Secondary | ICD-10-CM

## 2023-07-31 LAB — CBC
HCT: 30.6 % — ABNORMAL LOW (ref 39.0–52.0)
Hemoglobin: 9.7 g/dL — ABNORMAL LOW (ref 13.0–17.0)
MCH: 28.3 pg (ref 26.0–34.0)
MCHC: 31.7 g/dL (ref 30.0–36.0)
MCV: 89.2 fL (ref 80.0–100.0)
Platelets: 309 10*3/uL (ref 150–400)
RBC: 3.43 MIL/uL — ABNORMAL LOW (ref 4.22–5.81)
RDW: 15.2 % (ref 11.5–15.5)
WBC: 7.1 10*3/uL (ref 4.0–10.5)
nRBC: 0 % (ref 0.0–0.2)

## 2023-07-31 LAB — GLUCOSE, CAPILLARY
Glucose-Capillary: 120 mg/dL — ABNORMAL HIGH (ref 70–99)
Glucose-Capillary: 126 mg/dL — ABNORMAL HIGH (ref 70–99)
Glucose-Capillary: 109 mg/dL — ABNORMAL HIGH (ref 70–99)
Glucose-Capillary: 140 mg/dL — ABNORMAL HIGH (ref 70–99)

## 2023-07-31 NOTE — Progress Notes (Signed)
  Progress Note   Patient: Eric Zamora [id number] DOB: Oct 05, 1945 DOA: 06/15/2023     46 DOS: the patient was seen and examined on 07/31/2023 at 10:57AM      Brief hospital course: 77 y.o. 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, CAD, dCHF, HTN, hx DVT no longer on [facility], and HLD who admitted on 10/15 for severe sepsis due to perirectal abscess.     Hospitalization complicated by stroke, DKA, aspiration pneumonia, new onset Afib, and diarrhea.     Assessment and Plan: * Acute stroke - Continue apixaban and simvastatin  Diarrhea Unchanged - Continue cholestyramine, Imodium  New onset atrial fibrillation Rate improved - Continue metoprolol, apixaban    Depression -Continue Paxil            Subjective: No new nursing concerns, patient is more alert.  No fever, no bleeding, still poor oral intake.     Physical Exam: BP 126/73 (BP Location: Left Arm)   Pulse 70   Temp 97.8 F (36.6 C) (Oral)   Resp 16   Ht 5\' 5"  (1.651 m)   Wt 72.4 kg   SpO2 100%   BMI 26.56 kg/m   Frail elderly adult male, lying in bed, weak and debilitated Irregularly irregular, rate controlled, no murmurs, no pitting edema Respiratory rate seems diminished, lung sounds diminished, respiratory effort weak, no rales or wheezes appreciated Abdomen soft, no grimace to palpation, no ascites or distention Makes eye contact, oriented to [facility] longer hospital, month, and is watching television.      Data Reviewed: Hemoglobin 9.7, stable Glucose normal      Disposition: Status is: Inpatient The patient will require significant rehabilitation to return to her prior level of function, we still await rehabilitation acceptance        Author: Alberteen Sam, MD 07/31/2023 5:08 PM  For on call review [email or web address].

## 2023-07-31 NOTE — Plan of Care (Signed)
  Problem: Health Behavior/Discharge Planning: Goal: Ability to identify and utilize available resources and services will improve Outcome: Progressing   Problem: Metabolic: Goal: Ability to maintain appropriate glucose levels will improve Outcome: Progressing   Problem: Nutritional: Goal: Maintenance of adequate nutrition will improve Outcome: Progressing   Problem: Skin Integrity: Goal: Risk for impaired skin integrity will decrease Outcome: Progressing   Problem: Tissue Perfusion: Goal: Adequacy of tissue perfusion will improve Outcome: Progressing   Problem: Clinical Measurements: Goal: Ability to maintain clinical measurements within normal limits will improve Outcome: Progressing Goal: Will remain free from infection Outcome: Progressing   Problem: Pain Managment: Goal: General experience of comfort will improve Outcome: Progressing   Problem: Safety: Goal: Ability to remain free from injury will improve Outcome: Progressing   Problem: Ischemic Stroke/TIA Tissue Perfusion: Goal: Complications of ischemic stroke/TIA will be minimized Outcome: Progressing

## 2023-07-31 NOTE — Plan of Care (Signed)
Problem: Education: Goal: Ability to describe self-care measures that may prevent or decrease complications (Diabetes Survival Skills Education) will improve Outcome: Progressing Goal: Individualized Educational Video(s) Outcome: Progressing   Problem: Coping: Goal: Ability to adjust to condition or change in health will improve Outcome: Progressing   Problem: Fluid Volume: Goal: Ability to maintain a balanced intake and output will improve Outcome: Progressing   Problem: Health Behavior/Discharge Planning: Goal: Ability to identify and utilize available resources and services will improve Outcome: Progressing Goal: Ability to manage health-related needs will improve Outcome: Progressing   Problem: Metabolic: Goal: Ability to maintain appropriate glucose levels will improve Outcome: Progressing   Problem: Nutritional: Goal: Maintenance of adequate nutrition will improve Outcome: Progressing Goal: Progress toward achieving an optimal weight will improve Outcome: Progressing   Problem: Skin Integrity: Goal: Risk for impaired skin integrity will decrease Outcome: Progressing   Problem: Tissue Perfusion: Goal: Adequacy of tissue perfusion will improve Outcome: Progressing   Problem: Education: Goal: Knowledge of General Education information will improve Description: Including pain rating scale, medication(s)/side effects and non-pharmacologic comfort measures Outcome: Progressing   Problem: Health Behavior/Discharge Planning: Goal: Ability to manage health-related needs will improve Outcome: Progressing   Problem: Clinical Measurements: Goal: Ability to maintain clinical measurements within normal limits will improve Outcome: Progressing Goal: Will remain free from infection Outcome: Progressing Goal: Diagnostic test results will improve Outcome: Progressing Goal: Respiratory complications will improve Outcome: Progressing Goal: Cardiovascular complication will  be avoided Outcome: Progressing   Problem: Activity: Goal: Risk for activity intolerance will decrease Outcome: Progressing   Problem: Nutrition: Goal: Adequate nutrition will be maintained Outcome: Progressing   Problem: Coping: Goal: Level of anxiety will decrease Outcome: Progressing   Problem: Elimination: Goal: Will not experience complications related to bowel motility Outcome: Progressing Goal: Will not experience complications related to urinary retention Outcome: Progressing   Problem: Pain Managment: Goal: General experience of comfort will improve Outcome: Progressing   Problem: Safety: Goal: Ability to remain free from injury will improve Outcome: Progressing   Problem: Skin Integrity: Goal: Risk for impaired skin integrity will decrease Outcome: Progressing   Problem: Fluid Volume: Goal: Hemodynamic stability will improve Outcome: Progressing   Problem: Clinical Measurements: Goal: Diagnostic test results will improve Outcome: Progressing Goal: Signs and symptoms of infection will decrease Outcome: Progressing   Problem: Respiratory: Goal: Ability to maintain adequate ventilation will improve Outcome: Progressing   Problem: Education: Goal: Ability to describe self-care measures that may prevent or decrease complications (Diabetes Survival Skills Education) will improve Outcome: Progressing Goal: Individualized Educational Video(s) Outcome: Progressing   Problem: Cardiac: Goal: Ability to maintain an adequate cardiac output will improve Outcome: Progressing   Problem: Health Behavior/Discharge Planning: Goal: Ability to identify and utilize available resources and services will improve Outcome: Progressing Goal: Ability to manage health-related needs will improve Outcome: Progressing   Problem: Fluid Volume: Goal: Ability to achieve a balanced intake and output will improve Outcome: Progressing   Problem: Metabolic: Goal: Ability to  maintain appropriate glucose levels will improve Outcome: Progressing   Problem: Nutritional: Goal: Maintenance of adequate nutrition will improve Outcome: Progressing Goal: Maintenance of adequate weight for body size and type will improve Outcome: Progressing   Problem: Respiratory: Goal: Will regain and/or maintain adequate ventilation Outcome: Progressing   Problem: Urinary Elimination: Goal: Ability to achieve and maintain adequate renal perfusion and functioning will improve Outcome: Progressing   Problem: Education: Goal: Knowledge of disease or condition will improve Outcome: Progressing Goal: Knowledge of secondary prevention will improve (  MUST DOCUMENT ALL) Outcome: Progressing Goal: Knowledge of patient specific risk factors will improve Loraine Leriche N/A or DELETE if not current risk factor) Outcome: Progressing   Problem: Ischemic Stroke/TIA Tissue Perfusion: Goal: Complications of ischemic stroke/TIA will be minimized Outcome: Progressing

## 2023-08-01 DIAGNOSIS — A419 Sepsis, unspecified organism: Secondary | ICD-10-CM | POA: Diagnosis not present

## 2023-08-01 DIAGNOSIS — N179 Acute kidney failure, unspecified: Secondary | ICD-10-CM | POA: Diagnosis not present

## 2023-08-01 DIAGNOSIS — I4891 Unspecified atrial fibrillation: Secondary | ICD-10-CM | POA: Diagnosis not present

## 2023-08-01 DIAGNOSIS — E1110 Type 2 diabetes mellitus with ketoacidosis without coma: Secondary | ICD-10-CM | POA: Diagnosis not present

## 2023-08-01 LAB — GLUCOSE, CAPILLARY
Glucose-Capillary: 114 mg/dL — ABNORMAL HIGH (ref 70–99)
Glucose-Capillary: 152 mg/dL — ABNORMAL HIGH (ref 70–99)
Glucose-Capillary: 111 mg/dL — ABNORMAL HIGH (ref 70–99)
Glucose-Capillary: 131 mg/dL — ABNORMAL HIGH (ref 70–99)

## 2023-08-01 MED ORDER — DIPHENOXYLATE-ATROPINE 2.5-0.025 MG PO TABS
1.0000 | ORAL_TABLET | Freq: Two times a day (BID) | ORAL | Status: DC
  Administered 2023-08-01 – 2023-08-02 (×3): 1 via ORAL
  Filled 2023-08-01 (×3): qty 1

## 2023-08-01 NOTE — Plan of Care (Signed)
Problem: Education: Goal: Ability to describe self-care measures that may prevent or decrease complications (Diabetes Survival Skills Education) will improve Outcome: Progressing Goal: Individualized Educational Video(s) Outcome: Progressing   Problem: Coping: Goal: Ability to adjust to condition or change in health will improve Outcome: Progressing   Problem: Fluid Volume: Goal: Ability to maintain a balanced intake and output will improve Outcome: Progressing   Problem: Health Behavior/Discharge Planning: Goal: Ability to identify and utilize available resources and services will improve Outcome: Progressing Goal: Ability to manage health-related needs will improve Outcome: Progressing   Problem: Metabolic: Goal: Ability to maintain appropriate glucose levels will improve Outcome: Progressing   Problem: Nutritional: Goal: Maintenance of adequate nutrition will improve Outcome: Progressing Goal: Progress toward achieving an optimal weight will improve Outcome: Progressing   Problem: Skin Integrity: Goal: Risk for impaired skin integrity will decrease Outcome: Progressing   Problem: Tissue Perfusion: Goal: Adequacy of tissue perfusion will improve Outcome: Progressing   Problem: Education: Goal: Knowledge of General Education information will improve Description: Including pain rating scale, medication(s)/side effects and non-pharmacologic comfort measures Outcome: Progressing   Problem: Health Behavior/Discharge Planning: Goal: Ability to manage health-related needs will improve Outcome: Progressing   Problem: Clinical Measurements: Goal: Ability to maintain clinical measurements within normal limits will improve Outcome: Progressing Goal: Will remain free from infection Outcome: Progressing Goal: Diagnostic test results will improve Outcome: Progressing Goal: Respiratory complications will improve Outcome: Progressing Goal: Cardiovascular complication will  be avoided Outcome: Progressing   Problem: Activity: Goal: Risk for activity intolerance will decrease Outcome: Progressing   Problem: Nutrition: Goal: Adequate nutrition will be maintained Outcome: Progressing   Problem: Coping: Goal: Level of anxiety will decrease Outcome: Progressing   Problem: Elimination: Goal: Will not experience complications related to bowel motility Outcome: Progressing Goal: Will not experience complications related to urinary retention Outcome: Progressing   Problem: Pain Managment: Goal: General experience of comfort will improve Outcome: Progressing   Problem: Safety: Goal: Ability to remain free from injury will improve Outcome: Progressing   Problem: Skin Integrity: Goal: Risk for impaired skin integrity will decrease Outcome: Progressing   Problem: Fluid Volume: Goal: Hemodynamic stability will improve Outcome: Progressing   Problem: Clinical Measurements: Goal: Diagnostic test results will improve Outcome: Progressing Goal: Signs and symptoms of infection will decrease Outcome: Progressing   Problem: Respiratory: Goal: Ability to maintain adequate ventilation will improve Outcome: Progressing   Problem: Education: Goal: Ability to describe self-care measures that may prevent or decrease complications (Diabetes Survival Skills Education) will improve Outcome: Progressing Goal: Individualized Educational Video(s) Outcome: Progressing   Problem: Cardiac: Goal: Ability to maintain an adequate cardiac output will improve Outcome: Progressing   Problem: Health Behavior/Discharge Planning: Goal: Ability to identify and utilize available resources and services will improve Outcome: Progressing Goal: Ability to manage health-related needs will improve Outcome: Progressing   Problem: Fluid Volume: Goal: Ability to achieve a balanced intake and output will improve Outcome: Progressing   Problem: Metabolic: Goal: Ability to  maintain appropriate glucose levels will improve Outcome: Progressing   Problem: Nutritional: Goal: Maintenance of adequate nutrition will improve Outcome: Progressing Goal: Maintenance of adequate weight for body size and type will improve Outcome: Progressing   Problem: Respiratory: Goal: Will regain and/or maintain adequate ventilation Outcome: Progressing   Problem: Urinary Elimination: Goal: Ability to achieve and maintain adequate renal perfusion and functioning will improve Outcome: Progressing   Problem: Education: Goal: Knowledge of disease or condition will improve Outcome: Progressing Goal: Knowledge of secondary prevention will improve (  MUST DOCUMENT ALL) Outcome: Progressing Goal: Knowledge of patient specific risk factors will improve Loraine Leriche N/A or DELETE if not current risk factor) Outcome: Progressing   Problem: Ischemic Stroke/TIA Tissue Perfusion: Goal: Complications of ischemic stroke/TIA will be minimized Outcome: Progressing

## 2023-08-01 NOTE — Progress Notes (Signed)
  Progress Note   Patient: Eric Zamora [id number]:[id number] DOB: May 06, 1946 DOA: 06/15/2023     47 DOS: the patient was seen and examined on 08/01/2023 at 8:41AM      Brief hospital course: 77 y.o. 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, CAD, dCHF, HTN, hx DVT no longer on [facility], and HLD who admitted on 10/15 for severe sepsis due to perirectal abscess.     Hospitalization complicated by stroke, DKA, aspiration pneumonia, new onset Afib, and diarrhea.  See longer summary from 11/26 and 11/27    Assessment and Plan: * Acute stroke - Continue apixaban and simvastatin  New onset atrial fibrillation Rate controlled - Continue metoprolol and apixaban  Rectal bleeding due to rectal tube Resolved, hemoglobin stable - Continue apixaban  Diarrhea Unchanged - Continue cholestyramine - Stop Imodium, changed to Lomotil   Depression -Continue Paxil   Presence of central line - Plan to take out tomorrow before discharge         Subjective: No fever, bleeding, change in diarrhea.  No respiratory symptoms.  Patient denies complaints, overall very subdued and sluggish, but oriented to self and "hospital".  No new nursing concerns.     Physical Exam: BP 136/71   Pulse 79   Temp 98.5 F (36.9 C) (Oral)   Resp 18   Ht 5\' 5"  (1.651 m)   Wt 72.9 kg   SpO2 100%   BMI 26.74 kg/m   Frail elderly adult male, lying in bed, weak and debilitated Irregularly irregular heart rate, rate controlled, no murmurs, no peripheral edema Respiratory rate normal, lung sounds diminished, respiratory effort weak, no rales or wheezes appreciated Abdomen soft, no grimace to palpation, no ascites.  Mild distention but stable Makes eye contact, oriented to [facility] long hospital, oriented to month, denies complaints, strength 4/5 in all 4 extremities.      Data Reviewed: Glucose normal     Disposition: Status is: Inpatient The patient will  require significant rehabilitation to return to her prior level of function, we still await rehabilitation acceptance        Author: Alberteen Sam, MD 08/01/2023 11:34 AM  For on call review [email or web address].

## 2023-08-01 NOTE — Progress Notes (Signed)
Mobility Specialist - Progress Note   08/01/23 1117  Mobility  Activity Transferred from bed to chair  Level of Assistance Total care  Assistive Device MaxiSky / Overhead Lift  Activity Response Tolerated well  Mobility Referral Yes  $Mobility charge 1 Mobility  Mobility Specialist Start Time (ACUTE ONLY) 1053  Mobility Specialist Stop Time (ACUTE ONLY) 1113  Mobility Specialist Time Calculation (min) (ACUTE ONLY) 20 min   Received in bed and agreed to mobility. Needed assistance cleaning then lift to chair. Left in chair with all needs met.  Marilynne Halsted Mobility Specialist

## 2023-08-01 NOTE — Plan of Care (Signed)
  Problem: Metabolic: Goal: Ability to maintain appropriate glucose levels will improve Outcome: Progressing   Problem: Skin Integrity: Goal: Risk for impaired skin integrity will decrease Outcome: Progressing   Problem: Tissue Perfusion: Goal: Adequacy of tissue perfusion will improve Outcome: Progressing   Problem: Clinical Measurements: Goal: Ability to maintain clinical measurements within normal limits will improve Outcome: Progressing Goal: Will remain free from infection Outcome: Progressing   Problem: Pain Managment: Goal: General experience of comfort will improve Outcome: Progressing   Problem: Safety: Goal: Ability to remain free from injury will improve Outcome: Progressing   Problem: Ischemic Stroke/TIA Tissue Perfusion: Goal: Complications of ischemic stroke/TIA will be minimized Outcome: Progressing

## 2023-08-02 DIAGNOSIS — R6520 Severe sepsis without septic shock: Secondary | ICD-10-CM | POA: Diagnosis not present

## 2023-08-02 DIAGNOSIS — A419 Sepsis, unspecified organism: Secondary | ICD-10-CM | POA: Diagnosis not present

## 2023-08-02 LAB — GLUCOSE, CAPILLARY: Glucose-Capillary: 128 mg/dL — ABNORMAL HIGH (ref 70–99)

## 2023-08-02 MED ORDER — CHOLESTYRAMINE LIGHT 4 G PO PACK: 4.0000 g | PACK | Freq: Four times a day (QID) | ORAL | Status: DC

## 2023-08-02 MED ORDER — SIMETHICONE 40 MG/0.6ML PO SUSP: 40.0000 mg | Freq: Two times a day (BID) | ORAL | 0 refills | Status: DC

## 2023-08-02 MED ORDER — METOPROLOL TARTRATE 50 MG PO TABS: 50.0000 mg | ORAL_TABLET | Freq: Two times a day (BID) | ORAL | Status: DC

## 2023-08-02 MED ORDER — APIXABAN 5 MG PO TABS: 5.0000 mg | ORAL_TABLET | Freq: Two times a day (BID) | ORAL | Status: DC

## 2023-08-02 MED ORDER — ENSURE ENLIVE PO LIQD: 237.0000 mL | Freq: Three times a day (TID) | ORAL | 12 refills | Status: DC

## 2023-08-02 MED ORDER — DIPHENOXYLATE-ATROPINE 2.5-0.025 MG PO TABS: 1.0000 | ORAL_TABLET | Freq: Two times a day (BID) | ORAL | Status: DC

## 2023-08-02 NOTE — Care Management Important Message (Signed)
Important Message  Patient Details IM Letter given. Name: Eric Zamora MRN: [id number] Date of Birth: 1946-01-30   Important Message Given:  Yes - Medicare IM     Caren Macadam 08/02/2023, 10:56 AM

## 2023-08-02 NOTE — Progress Notes (Signed)
Called and spoke with Efraim Kaufmann at [facility]. Report given. Melissa is aware [facility] has been called.

## 2023-08-02 NOTE — Discharge Summary (Signed)
Physician Discharge Summary   Patient: Eric Zamora MRN: [id number] DOB: May 13, 1946  Admit date:     06/15/2023  Discharge date: 08/02/23  Discharge Physician: Alberteen Sam   PCP: Uvaldo Bristle, PA-C     Recommendations at discharge:  Follow up with General Surgery for rectal abscess Follow up with Cardiology for new onset Atrial fibrillation Follow up with [facility] for stroke Follow up with the [facility] for Infectious Disease for treatment of latent syphilis  SNF: Titrate off Lomotil and simethicone in 3-4 weeks; if successful, titrate off cholestyramine after that If diarrhea worsens after titrating off Lomotil and cholestyramine, resume both and refer to Gastroenterology for follow up Check CBC and BMP in 1 week Repeat scrotal ultrasound in 1 to 2 weeks and if fluid collection still present, refer to urology     Discharge Diagnoses: Principal Problem:   Severe sepsis due to perirectal abscess(HCC) Active Problems:   Atrial fibrillation with rapid ventricular response (HCC)   Diabetic ketoacidosis without coma associated with type 2 diabetes mellitus (HCC)   Acute metabolic encephalopathy   Acute renal failure superimposed on stage 3b chronic kidney disease (HCC)   Dysphagia   CAD- Moderate LAD disease with moderate to severe ostial first diagonal disease by cath 07/2013 - not ammendable to PCI. 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, CAD, dCHF, HTN, hx DVT no longer on [facility], and HLD who admitted on 10/15 for severe sepsis due to perirectal abscess.    Hospitalization complicated later by new onset atrial fibrillation, acute metabolic encephalopathy, DKA, renal failure, and stroke.  10/15 Admitted to stepdown unit. 10/17 Overnight with fever, aspiration event, new AG acidosis, transferred back to SDU for BiPAP and insulin drip for DKA 10/18 Taken to OR for drainage of abscess 10/23 Cardiology was consulted for new onset A-fib with RVR. 10/24 Nephrology was consulted for AKI and hyponatremia.  CT head was obtained for ongoing AMS and was found to have acute/subacute right MCA territory infarct.  Neurology was also consulted.  Started treatment for latent syphilis 10/25 Palliative care was consulted. 10/31 Anticoagulation started again. 11/4 Found to have ileus.  GI consulted.  Recommended DNR to legal guardian. 11/5 Two-physician DNR form was signed and sent to guardian.  Will await decision. 11/8 Final dose Bicillin given for latent Syphilis 11/10 Developed colitis and frequent diarrhea.  GI was consulted.  C. difficile and GI pathogen panel testing were negative.      Perirectal abscess with severe sepsis, present on admission Admitted on antibiotics, Gen Surg consulted, underwent drainage of abscess on 10/18.  Completed 2-week course after I&D.  Needs follow-up with general surgery after discharge.       New onset atrial fibrillation with RVR New diagnosis.  Seen by cardiology.  Titrated up on metoprolol, and discharged on new Eliquis.      Acute  metabolic encephalopathy/history of dementia/oropharyngeal dysphagia At baseline the patient has cognitive impairment and dementia, but is conversational, can ambulate with a walker at times, wheelchair at other times.  Here the patient had waxing and waning persistent  metabolic encephalopathy due to sepsis, DKA, renal failure, and stroke.  This was gradually improving by the time of discharge.   Acute right MCA stroke As a result of persistent metabolic encephalopathy in the patient's second week in the hospital, CT head was obtained that showed acute versus subacute stroke, timing unknown.  MRI brain confirmed acute stroke.  MRA showed no significant vascular disease, carotid Dopplers showed no significant stenosis.  EEG no seizures.  Neurology were consulted, timing of anticoagulation was determined by neurology based on imaging.  He resumed anticoagulation and did well.      Use of antiepileptic medicine The patient was on Keppra at the time of admission.  Chart reviewed by neurology showed no convincing evidence of seizures ever.  He has been evaluated by neurology who did not feel that he had had a seizure.  Neurology had recommended stopping Keppra.     Presumed colitis Rectal bleeding due to rectal tube, resolved  In his third week in the hospital, patient developed profuse diarrhea and stool incontinence.  GI were consulted.  C. difficile and GI pathogen panels were negative.  He did have some rectal bleeding that was noted, bright red.  GI recommended no colonoscopy, the rectal tube was removed and the bleeding stopped but his hemoglobin remained stable.    Cholestyramine and Lomotil were started and the diarrhea improved.  They should be titrated off over the next 3 to 4 weeks, and if diarrhea resumes, resume cholestyramine and Lomotil and will refer to GI.      Acute kidney injury on chronic kidney disease stage IIIb/none anion gap metabolic acidosis Renal function has recovered.      History of syphilis Patient noted by neurology to have a nonreactive RPR.  Case was discussed with ID who recommended Bicillin weekly x 3.  He completed this treatment in the hospital and should follow-up with infectious disease as an outpatient.      Fluid  in the right inguinal canal This was an incidental finding.  Unclear clinical significance.  Asymptomatic. - Repeat scrotal ultrasound in 1 to 2 weeks and if fluid collection still present, refer to urology      Oral thrush Completed 7-day course of Diflucan.             The [facility] Controlled Substances Registry was reviewed for this patient prior to discharge.  Consultants: Gastroenterology Palliative care Neurology Nephrology Cardiology General Surgery  Procedures performed:  Drainage of rectal abscess EEG Modified barium swallow Scrotal ultrasound CT abdomen and pelvis without contrast CT head MR angiogram of the head MRI brain Right upper quadrant ultrasound CT chest abdomen and pelvis CT head  Disposition: Skilled nursing facility Diet recommendation:  Chopped diet, heart healthy  DISCHARGE MEDICATION: Allergies as of 08/02/2023   No Known Allergies      Medication List     STOP taking these medications    clopidogrel 75 MG tablet Commonly known as: PLAVIX   furosemide 40 MG tablet Commonly known as: LASIX   levETIRAcetam 500 MG tablet Commonly known as: KEPPRA   potassium chloride SA 20 MEQ tablet Commonly known as: KLOR-CON M       TAKE these medications    apixaban 5 MG Tabs tablet Commonly known as: ELIQUIS  Take 1 tablet (5 mg total) by mouth 2 (two) times daily.   cholestyramine light 4 g packet Commonly known as: PREVALITE Take 1 packet (4 g total) by mouth 4 (four) times daily.   diphenoxylate-atropine 2.5-0.025 MG tablet Commonly known as: LOMOTIL Take 1 tablet by mouth 2 (two) times daily.   ergocalciferol 1.25 MG (50000 UT) capsule Commonly known as: VITAMIN D2 Take 50,000 Units by mouth every 30 (thirty) days. On the 7 th of every month.   feeding supplement Liqd Take 237 mLs by mouth 3 (three) times daily between meals.   latanoprost 0.005 % ophthalmic solution Commonly known as: XALATAN Place 1 drop  into both eyes at bedtime.   metoprolol tartrate 50 MG tablet Commonly known as: LOPRESSOR Take 1 tablet (50 mg total) by mouth 2 (two) times daily. What changed:  medication strength how much to take   PARoxetine 10 MG tablet Commonly known as: PAXIL Take 10 mg by mouth daily.   simethicone 40 MG/0.6ML drops Commonly known as: MYLICON Take 0.6 mLs (40 mg total) by mouth in the morning and at bedtime.   simvastatin 20 MG tablet Commonly known as: ZOCOR Take 20 mg by mouth at bedtime.        Contact information for follow-up providers     Ronney Asters, NP. Schedule an appointment as soon as possible for a visit in 1 month(s).   Specialty: Cardiology Why: For new atrial fibrillation Contact information: [address] [place] [address] [phone]         Axel Filler, MD. Schedule an appointment as soon as possible for a visit in 2 week(s).   Specialty: General Surgery Why: For rectal abscess Contact information: [address] [place] [address] [phone]         [facility]             . Schedule an appointment as soon as possible for a visit in 1 month(s).   Why: For follow up latent syphilis treatment Contact information: [address] [place] [address]             Contact information for after-discharge care     Destination     [facility] .   Service: Skilled Nursing Contact information: [address] [place] [address] [phone]                     Discharge Instructions     Ambulatory referral to Neurology   Complete by: As directed    An appointment is requested in approximately: 8 weeks For stroke       Discharge Exam: Filed Weights   07/31/23 0718 08/01/23 0701 08/02/23 0622  Weight: 72.4 kg 72.9 kg 72.5 kg    General: Pt is alert, awake, not in acute  distress, lying in bed, makes eye contact Cardiovascular: RRR, nl S1-S2, no murmurs appreciated.   No peripheral edema.   Respiratory: Normal respiratory rate and rhythm.  CTAB without rales or wheezes. Abdominal: Abdomen soft and non-tender.  No distension or HSM.   Neuro/Psych: Strength symmetric in upper and lower extremities but generally very weak.  Judgment and insight appear closer to baseline, responds to questions, speech dysarthric at baseline, oreinted to self, "hospital" not month or year, mild psychomotor slowing.   Condition at discharge: fair  The results of significant diagnostics from this hospitalization (including imaging, microbiology,  ancillary and laboratory) are listed below for reference.   Imaging Studies: DG Abd 1 View  Result Date: 07/21/2023 CLINICAL DATA:  Abdominal distension EXAM: ABDOMEN - 1 VIEW COMPARISON:  07/20/2023 FINDINGS: Continued dilation of the transverse colon and sigmoid colon, not substantially changed from 07/20/2023. No dilated small bowel observed. Mild lower lumbar spondylosis. IMPRESSION: 1. Continued dilation of the transverse colon and sigmoid colon, not substantially changed from 07/20/2023. 2. Mild lower lumbar spondylosis. Electronically Signed   By: Gaylyn Rong M.D.   On: 07/21/2023 11:59   US SCROTUM  Result Date: 07/20/2023 CLINICAL DATA:  Undescended testis EXAM: ULTRASOUND OF SCROTUM TECHNIQUE: Complete ultrasound examination of the testicles, epididymis, and other scrotal structures was performed. COMPARISON:  07/18/2023 FINDINGS: Right testicle Measurements: 3.9 x 2.1 x 2.6 cm. No mass or microlithiasis visualized. Left testicle Measurements: 3.5 x 2.3 x 2.1 cm. No mass or microlithiasis visualized. Right epididymis: Complex right epididymal cysts are identified, largest measuring up to 1.3 x 0.8 by 1.5 cm. Left epididymis: Complex left epididymal cysts are identified, largest measuring up to 1.0 x 1.0 x 1.2 cm. Hydrocele:   None visualized. Varicocele:  None visualized. Other: There is fluid within the right inguinal canal, measuring 4.5 x 2.3 by 4.2 cm, corresponding to the CT finding. IMPRESSION: 1. Unremarkable appearance of the testes, normally located within the scrotum. 2. Fluid within the right inguinal canal, corresponding to prior CT finding. 3. Bilateral complex epididymal cysts of uncertain clinical significance. Electronically Signed   By: Sharlet Salina M.D.   On: 07/20/2023 23:16   DG Abd 1 View  Result Date: 07/20/2023 CLINICAL DATA:  Abdominal distension. EXAM: ABDOMEN - 1 VIEW COMPARISON:  July 16, 2023.  July 18, 2023. FINDINGS: Stable colonic dilatation is noted. No definite small bowel dilatation is noted. IMPRESSION: Stable colonic dilatation is noted suggesting ileus or obstruction. Electronically Signed   By: Lupita Raider M.D.   On: 07/20/2023 07:47   CT ABDOMEN PELVIS WO CONTRAST  Result Date: 07/18/2023 CLINICAL DATA:  Abdominal pain. EXAM: CT ABDOMEN AND PELVIS WITHOUT CONTRAST TECHNIQUE: Multidetector CT imaging of the abdomen and pelvis was performed following the standard protocol without IV contrast. RADIATION DOSE REDUCTION: This exam was performed according to the departmental dose-optimization program which includes automated exposure control, adjustment of the mA and/or kV according to patient size and/or use of iterative reconstruction technique. COMPARISON:  07/05/2023. FINDINGS: Lower chest: Small right-sided pleural effusion. Volume loss or consolidation at the right base. Cardiomegaly. Atheromatous calcifications. Trace pericardial effusion or pericardial thickening. Hepatobiliary: No focal liver abnormality is seen. No gallstones, gallbladder wall thickening, or biliary dilatation. Pancreas: Unremarkable. No pancreatic ductal dilatation or surrounding inflammatory changes. Spleen: Normal in size without focal abnormality. Adrenals/Urinary Tract: No adrenal lesions. Left  kidney cyst measures 2.8 cm. No follow up imaging recommended. No hydronephrosis or nephrolithiasis. Unremarkable urinary bladder. Stomach/Bowel: Stomach is unremarkable. No small bowel dilatation. There is marked thickening of the wall of the cecum and ascending colon consistent with colitis. There is dilatation of the transverse colon consistent with ileus. There is thickening of the wall of the rectum, endo mucosal process is not excluded. Vascular/Lymphatic: Aortic atherosclerosis. No enlarged abdominal or pelvic lymph nodes. Reproductive: Prostate is unremarkable. Right inguinal canal soft tissue density structure could be undescended right testis Other: No abdominal wall hernia or abnormality. No abdominopelvic ascites. Musculoskeletal: No acute or significant osseous findings. IMPRESSION: 1. Right-sided pleural effusion with volume loss or consolidation at the right base. 2.  Cardiomegaly. 3. Left kidney cyst. 4. Thickening of the wall of the cecum and ascending colon consistent with colitis. 5. Thickening of the wall of the rectum, and a mucosal process is not excluded. Endoscopic correlation should be considered. 6. Right inguinal canal soft tissue density structure could be undescended right testis. Scrotal ultrasound correlation would be helpful. 7. Aortic atherosclerosis. Aortic Atherosclerosis (ICD10-I70.0). Electronically Signed   By: Layla Maw M.D.   On: 07/18/2023 16:44   DG Abd 1 View  Result Date: 07/16/2023 CLINICAL DATA:  History of perirectal abscess and sepsis, follow-up bowel dilatation EXAM: ABDOMEN - 1 VIEW COMPARISON:  07/15/2023 FINDINGS: Scattered large and small bowel gas is noted. Persistent dilatation of a central sigmoid loop is seen and stable. No free air is noted. No new focal dilatation is seen. IMPRESSION: Stable appearance when compared with the prior day. Electronically Signed   By: Alcide Clever M.D.   On: 07/16/2023 22:23   DG Abd 1 View  Result Date:  07/15/2023 CLINICAL DATA:  Follow-up for ileus. EXAM: ABDOMEN - 1 VIEW COMPARISON:  07/14/2023 film. FINDINGS: 4:44 a.m. There is mild gas distention again noted of a redundant sigmoid loop in the central abdomen. Rest of the aerated bowel appears normal in caliber. There is no supine evidence of free air. IMPRESSION: Mild gas distention of a redundant sigmoid loop in the central abdomen. No evidence of bowel obstruction. Electronically Signed   By: Almira Bar M.D.   On: 07/15/2023 07:07   DG Abd Portable 1V  Result Date: 07/14/2023 CLINICAL DATA:  Ileus. EXAM: PORTABLE ABDOMEN - 1 VIEW COMPARISON:  July 13, 2023. FINDINGS: Significantly decreased bowel dilatation is noted compared to prior exam suggesting improving ileus or obstruction. Phleboliths are noted in the pelvis. IMPRESSION: Significantly decreased bowel dilatation as noted above. Electronically Signed   By: Lupita Raider M.D.   On: 07/14/2023 12:25   DG Abd Portable 1V  Result Date: 07/13/2023 CLINICAL DATA:  Ileus. EXAM: PORTABLE ABDOMEN - 1 VIEW COMPARISON:  July 12, 2023. FINDINGS: Stable air-filled bowel dilatation is noted which most likely represents colon. No definite small bowel dilatation is noted. Maximum measured diameter of 9.5 cm is measured. Phleboliths are noted in the pelvis. IMPRESSION: Stable air-filled bowel dilatation is noted which most likely represents colon. It is uncertain if this represents ileus or obstruction. Electronically Signed   By: Lupita Raider M.D.   On: 07/13/2023 14:50   DG Abd Portable 1V  Result Date: 07/12/2023 CLINICAL DATA:  Ileus EXAM: PORTABLE ABDOMEN - 1 VIEW COMPARISON:  X-ray 07/10/2023 and older. FINDINGS: Air-filled dilated loops of bowel are seen in the abdomen. These could be colon and has a diameter approaching up to 9.4 cm today, similar to previous. There is some minimal small bowel gas. No obvious free air on this supine radiograph. Film is rotated. Degenerative  changes seen of the spine with overlapping cardiac leads. IMPRESSION: Persistent dilatation of bowel. Please correlate with the history. Ileus versus obstruction is in the differential. Additional evaluation as clinically directed Electronically Signed   By: Karen Kays M.D.   On: 07/12/2023 13:10   DG Abd Portable 1V  Result Date: 07/10/2023 CLINICAL DATA:  Ileus EXAM: PORTABLE ABDOMEN - 1 VIEW COMPARISON:  07/09/2023 FINDINGS: No significant change in diffusely distended loops of bowel throughout the central abdomen, measuring up to 10.8 cm in caliber. No obvious free air in the abdomen. IMPRESSION: No significant change in diffusely distended loops of bowel  throughout the central abdomen, measuring up to 10.8 cm in caliber. Findings are consistent with ileus. No obvious free air in the abdomen. Electronically Signed   By: Jearld Lesch M.D.   On: 07/10/2023 13:22   DG Abd Portable 1V  Result Date: 07/09/2023 CLINICAL DATA:  Follow-up ileus. EXAM: PORTABLE ABDOMEN - 1 VIEW COMPARISON:  07/08/23 FINDINGS: Again seen is diffuse colonic dilatation compatible with an ileus. The degree of bowel distension is similar to slightly improved in the interval. No signs of pneumoperitoneum or pneumatosis. IMPRESSION: Persistent ileus pattern. The degree of bowel distension is similar to slightly improved in the interval. Electronically Signed   By: Signa Kell M.D.   On: 07/09/2023 12:47   DG Abd Portable 1V  Result Date: 07/08/2023 CLINICAL DATA:  Ileus. EXAM: PORTABLE ABDOMEN - 1 VIEW COMPARISON:  July 06, 2023. FINDINGS: Stable diffuse colonic dilatation is noted consistent with ileus. No small bowel dilatation is noted. IMPRESSION: Stable diffuse colonic dilatation consistent with ileus. Electronically Signed   By: Lupita Raider M.D.   On: 07/08/2023 12:25   DG Chest Port 1 View  Result Date: 07/08/2023 CLINICAL DATA:  Status post PICC placement. EXAM: PORTABLE CHEST 1 VIEW COMPARISON:  June 17, 2023. FINDINGS: The heart size and mediastinal contours are within normal limits. Interval placement of right-sided PICC line with distal tip in expected position of cavoatrial junction. Both lungs are clear. The visualized skeletal structures are unremarkable. IMPRESSION: Interval placement of right-sided PICC line with distal tip in expected position of cavoatrial junction. Electronically Signed   By: Lupita Raider M.D.   On: 07/08/2023 12:24   [place] EKG SITE RITE  Result Date: 07/07/2023 If Site Rite image not attached, placement could not be confirmed due to current cardiac rhythm.  DG Abd Portable 1V  Result Date: 07/06/2023 CLINICAL DATA:  Ileus. EXAM: PORTABLE ABDOMEN - 1 VIEW COMPARISON:  Abdominal x-ray and CT abdomen pelvis from yesterday. FINDINGS: Continued but mildly improved diffuse colonic distention. No dilated small bowel loops. No acute osseous abnormality. IMPRESSION: 1. Continued but mildly improved colonic ileus. Electronically Signed   By: Obie Dredge M.D.   On: 07/06/2023 12:47   CT ABDOMEN PELVIS WO CONTRAST  Result Date: 07/05/2023 CLINICAL DATA:  Bowel obstruction suspected Sepsis with perirectal abscess. EXAM: CT ABDOMEN AND PELVIS WITHOUT CONTRAST TECHNIQUE: Multidetector CT imaging of the abdomen and pelvis was performed following the standard protocol without IV contrast. RADIATION DOSE REDUCTION: This exam was performed according to the departmental dose-optimization program which includes automated exposure control, adjustment of the mA and/or kV according to patient size and/or use of iterative reconstruction technique. COMPARISON:  CT 06/17/2023 FINDINGS: Lower chest: Small right pleural effusion has improved from prior CT. Previous left pleural effusion has resolved. There is minimal patchy airspace disease in the periphery of the right middle lobe. Bandlike opacity in the right lower lobe. There are coronary artery calcifications. Hepatobiliary: Lack of contrast and  motion limits hepatic assessment. Allowing for this, no acute or focal liver abnormality. Decompressed gallbladder. The biliary tree is poorly defined, but no biliary dilatation is seen. Pancreas: Unremarkable allowing for motion and lack of contrast. Spleen: Unremarkable allowing for motion and lack of contrast. Adrenals/Urinary Tract: No adrenal nodule. No hydronephrosis or renal calculi. Left renal cyst. No further follow-up imaging is recommended. Unremarkable urinary bladder. Stomach/Bowel: Improvement in the right perirectal collection from prior CT. There may be a single residual focus of extraluminal gas, series 2, image  91, but the collection is otherwise resolved. There is mild residual rectal wall thickening. The colon is diffusely dilated with air-fluid levels, but no transition point or obstruction. There is colonic redundancy. No significant formed stool in the colon. Other than the rectal wall thickening, no colonic wall thickening. No colonic pneumatosis. The appendix is not definitively seen. No small bowel distension or small bowel obstruction. Stomach is decompressed. Vascular/Lymphatic: Aortic atherosclerosis. No aneurysm. Retroaortic left renal vein. No portal venous or mesenteric gas. No enlarged lymph nodes. Reproductive: Prostate is unremarkable. Other: Right inguinal hernia contains fat and irregular fluid. There is presacral soft tissue edema. No no fluid collection. There is a diminutive fat containing umbilical hernia. No free air. Musculoskeletal: Unchanged L3 inferior endplate compression deformity. No acute osseous findings. Mild subcutaneous edema lateral to the hips. IMPRESSION: 1. Improvement in the right perirectal collection from prior CT. There may be a single residual focus of extraluminal gas, but the collection has otherwise resolved. There is mild residual rectal wall thickening. Recommend direct visualization with colonoscopy after resolution of symptoms to exclude the  possibility of underlying mass. 2. The colon is diffusely dilated with air-fluid levels, but no transition point or obstruction. Findings suspicious for colonic ileus. There is no small bowel dilatation or small bowel obstruction. 3. Right inguinal hernia contains fat and irregular fluid. 4. Small right pleural effusion has improved from prior CT. Previous left pleural effusion has resolved. Minimal patchy airspace disease in the periphery of the right middle lobe may represent atelectasis or pneumonia. Aortic Atherosclerosis (ICD10-I70.0). Electronically Signed   By: Narda Rutherford M.D.   On: 07/05/2023 17:40   DG Abd Portable 1V  Result Date: 07/05/2023 CLINICAL DATA:  Abdominal discomfort. EXAM: PORTABLE ABDOMEN - 1 VIEW COMPARISON:  July 04, 2013. FINDINGS: Dilated and air-filled colon is noted concerning for ileus. No significant small bowel dilatation is noted. IMPRESSION: Dilated and air-filled colon is noted concerning for ileus or less likely distal colonic obstruction. Electronically Signed   By: Lupita Raider M.D.   On: 07/05/2023 14:58    Microbiology: Results for orders placed or performed during the hospital encounter of 06/15/23  Blood Culture (routine x 2)     Status: None   Collection Time: 06/15/23 11:25 AM   Specimen: Site Not Specified; Blood  Result Value Ref Range Status   Specimen Description   Final    SITE NOT SPECIFIED Performed at [facility], [address], [place], [place] [address]    Special Requests   Final    BOTTLES DRAWN AEROBIC AND ANAEROBIC Blood Culture adequate volume Performed at [facility], [address] [place], [place] [address]    Culture   Final    NO GROWTH 5 DAYS Performed at [facility], [address], [place], [place] [address]    Report Status 06/20/2023 FINAL  Final  Blood Culture (routine x 2)     Status: None   Collection Time: 06/15/23 11:25 AM   Specimen: Site Not Specified;  Blood  Result Value Ref Range Status   Specimen Description   Final    SITE NOT SPECIFIED Performed at [facility], [address] [place], [place] [address]    Special Requests   Final    BOTTLES DRAWN AEROBIC AND ANAEROBIC Blood Culture results may not be optimal due to an excessive volume of blood received in culture bottles Performed at [facility], [address]  [place], [place] [address]    Culture   Final    NO GROWTH 5 DAYS Performed at [facility], [address] [place], [place] [address]    Report Status 06/20/2023 FINAL  Final  MRSA Next Gen by PCR, Nasal     Status: Abnormal   Collection Time: 06/15/23  3:47 PM   Specimen: Nasal Mucosa; Nasal Swab  Result Value Ref Range Status   MRSA by PCR Next Gen DETECTED (A) NOT DETECTED Final    Comment: (NOTE) The GeneXpert MRSA Assay (FDA approved for NASAL specimens only), is one component of a comprehensive MRSA colonization surveillance program. It is not intended to diagnose MRSA infection nor to guide or monitor treatment for MRSA infections. Test performance is not FDA approved in patients less than 7 years old. Performed at [facility], [address] [place], [place] [address]   SARS Coronavirus 2 by RT PCR (hospital order, performed in [facility] hospital lab) *cepheid single result test* Anterior Nasal Swab     Status: None   Collection Time: 06/16/23 12:01 PM   Specimen: Anterior Nasal Swab  Result Value Ref Range Status   SARS Coronavirus 2 by RT PCR NEGATIVE NEGATIVE Final    Comment: (NOTE) SARS-CoV-2 target nucleic acids are NOT DETECTED.  The SARS-CoV-2 RNA is generally detectable in upper and lower respiratory specimens during the acute phase of infection. The lowest concentration of SARS-CoV-2 viral copies this assay can detect is 250 copies / mL. A negative result does not preclude SARS-CoV-2 infection and should not be used as  the sole basis for treatment or other patient management decisions.  A negative result may occur with improper specimen collection / handling, submission of specimen other than nasopharyngeal swab, presence of viral mutation(s) within the areas targeted by this assay, and inadequate number of viral copies (<250 copies / mL). A negative result must be combined with clinical observations, patient history, and epidemiological information.  Fact Sheet for Patients:   RoadLapTop.co.za  Fact Sheet for Healthcare Providers: http://kim-miller.com/  This test is not yet approved or  cleared by the [place] FDA and has been authorized for detection and/or diagnosis of SARS-CoV-2 by FDA under an Emergency Use Authorization (EUA).  This EUA will remain in effect (meaning this test can be used) for the duration of the COVID-19 declaration under Section 564(b)(1) of the Act, 21 U.S.C. section 360bbb-3(b)(1), unless the authorization is terminated or revoked sooner.  Performed at [facility], [address] [place], [place] [address]   Respiratory (~20 pathogens) panel by PCR     Status: None   Collection Time: 06/16/23 12:01 PM   Specimen: Nasopharyngeal Swab; Respiratory  Result Value Ref Range Status   Adenovirus NOT DETECTED NOT DETECTED Final   Coronavirus 229E NOT DETECTED NOT DETECTED Final    Comment: (NOTE) The Coronavirus on the Respiratory Panel, DOES NOT test for the novel  Coronavirus (2019 nCoV)    Coronavirus HKU1 NOT DETECTED NOT DETECTED Final   Coronavirus NL63 NOT DETECTED NOT DETECTED Final   Coronavirus OC43 NOT DETECTED NOT DETECTED Final   Metapneumovirus NOT DETECTED NOT DETECTED Final   Rhinovirus / Enterovirus NOT DETECTED NOT DETECTED Final   Influenza A NOT DETECTED NOT DETECTED Final   Influenza B NOT DETECTED NOT DETECTED Final   Parainfluenza Virus 1 NOT DETECTED NOT DETECTED Final    Parainfluenza Virus 2 NOT DETECTED NOT DETECTED Final   Parainfluenza Virus 3 NOT  DETECTED NOT DETECTED Final   Parainfluenza Virus 4 NOT DETECTED NOT DETECTED Final   Respiratory Syncytial Virus NOT DETECTED NOT DETECTED Final   Bordetella pertussis NOT DETECTED NOT DETECTED Final   Bordetella Parapertussis NOT DETECTED NOT DETECTED Final   Chlamydophila pneumoniae NOT DETECTED NOT DETECTED Final   Mycoplasma pneumoniae NOT DETECTED NOT DETECTED Final    Comment: Performed at [facility], [address] [place], [place] [address]  Culture, blood (Routine X 2) w Reflex to ID Panel     Status: None   Collection Time: 06/17/23  4:24 AM   Specimen: BLOOD  Result Value Ref Range Status   Specimen Description   Final    BLOOD LEFT ANTECUBITAL Performed at [facility], [address] [place], [place] [address]    Special Requests   Final    BOTTLES DRAWN AEROBIC ONLY Blood Culture adequate volume Performed at [facility], [address] [place], [place] [address]    Culture   Final    NO GROWTH 5 DAYS Performed at [facility], [address] [place], [place] [address]    Report Status 06/22/2023 FINAL  Final  Culture, blood (Routine X 2) w Reflex to ID Panel     Status: None   Collection Time: 06/17/23  4:24 AM   Specimen: BLOOD LEFT HAND  Result Value Ref Range Status   Specimen Description   Final    BLOOD LEFT HAND Performed at [facility], [address], [place], [place] [address]    Special Requests   Final    BOTTLES DRAWN AEROBIC ONLY Blood Culture adequate volume Performed at [facility], [address] [place], [place] [address]    Culture   Final    NO GROWTH 5 DAYS Performed at [facility], [address] [place], [place] [address]    Report Status 06/22/2023 FINAL  Final  Gastrointestinal Panel by PCR , Stool     Status: None   Collection Time: 07/19/23 11:40  AM   Specimen: STOOL  Result Value Ref Range Status   Campylobacter species NOT DETECTED NOT DETECTED Final   Plesimonas shigelloides NOT DETECTED NOT DETECTED Final   Salmonella species NOT DETECTED NOT DETECTED Final   Yersinia enterocolitica NOT DETECTED NOT DETECTED Final   Vibrio species NOT DETECTED NOT DETECTED Final   Vibrio cholerae NOT DETECTED NOT DETECTED Final   Enteroaggregative E coli (EAEC) NOT DETECTED NOT DETECTED Final   Enteropathogenic E coli (EPEC) NOT DETECTED NOT DETECTED Final   Enterotoxigenic E coli (ETEC) NOT DETECTED NOT DETECTED Final   Shiga like toxin producing E coli (STEC) NOT DETECTED NOT DETECTED Final   Shigella/Enteroinvasive E coli (EIEC) NOT DETECTED NOT DETECTED Final   Cryptosporidium NOT DETECTED NOT DETECTED Final   Cyclospora cayetanensis NOT DETECTED NOT DETECTED Final   Entamoeba histolytica NOT DETECTED NOT DETECTED Final   Giardia lamblia NOT DETECTED NOT DETECTED Final   Adenovirus F40/41 NOT DETECTED NOT DETECTED Final   Astrovirus NOT DETECTED NOT DETECTED Final   Norovirus GI/GII NOT DETECTED NOT DETECTED Final   Rotavirus A NOT DETECTED NOT DETECTED Final   Sapovirus (I, II, IV, and V) NOT DETECTED NOT DETECTED Final    Comment: Performed at [facility], [address] [place], [place] [address]  C Difficile Quick Screen (NO PCR Reflex)     Status: None   Collection Time: 07/19/23 11:40 AM  Specimen: STOOL  Result Value Ref Range Status   C Diff antigen NEGATIVE NEGATIVE Final   C Diff toxin NEGATIVE NEGATIVE Final   C Diff interpretation No C. difficile detected.  Final    Comment: Performed at [facility], [address], [place], [place] [address]    Labs: CBC: Recent Labs  Lab 07/27/23 0331 07/28/23 0343 07/29/23 0258 07/30/23 0334 07/31/23 0247  WBC 5.5 5.7 7.3 7.4 7.1  HGB 9.3* 9.3* 9.6* 10.0* 9.7*  HCT 30.2* 29.2* 30.9* 32.7* 30.6*  MCV 90.1 89.3 90.4 90.6 89.2  PLT 312 304 326  338 309   Basic Metabolic Panel: Recent Labs  Lab 07/27/23 0331 07/28/23 0343 07/29/23 0258 07/30/23 0334  NA 137 142 137 144  K 3.2* 3.2* 3.2* 3.6  CL 108 112* 108 115*  CO2 22 23 22 22   GLUCOSE 92 112* 116* 114*  BUN 5* 7* 5* 7*  CREATININE 1.14 1.15 1.12 1.10  CALCIUM 7.9* 8.2* 7.9* 8.3*  MG 2.0  --   --   --    Liver Function Tests: Recent Labs  Lab 07/29/23 0258 07/30/23 0334  AST 23 22  ALT 18 17  ALKPHOS 30* 32*  BILITOT 0.5 0.7  PROT 7.1 7.1  ALBUMIN 2.7* 2.9*   CBG: Recent Labs  Lab 08/01/23 0735 08/01/23 1153 08/01/23 1710 08/01/23 2159 08/02/23 0743  GLUCAP 114* 152* 111* 131* 128*    Discharge time spent: approximately 35 minutes spent on discharge counseling, evaluation of patient on day of discharge, and coordination of discharge planning with nursing, social work, pharmacy and case management  Signed: Alberteen Sam, MD [facility] 08/02/2023

## 2023-08-02 NOTE — Plan of Care (Signed)
  Problem: Education: Goal: Ability to describe self-care measures that may prevent or decrease complications (Diabetes Survival Skills Education) will improve Outcome: Progressing Goal: Individualized Educational Video(s) Outcome: Progressing   Problem: Coping: Goal: Ability to adjust to condition or change in health will improve Outcome: Progressing   Problem: Fluid Volume: Goal: Ability to maintain a balanced intake and output will improve Outcome: Progressing   Problem: Health Behavior/Discharge Planning: Goal: Ability to identify and utilize available resources and services will improve Outcome: Progressing Goal: Ability to manage health-related needs will improve Outcome: Progressing   Problem: Metabolic: Goal: Ability to maintain appropriate glucose levels will improve Outcome: Progressing   Problem: Nutritional: Goal: Maintenance of adequate nutrition will improve Outcome: Progressing Goal: Progress toward achieving an optimal weight will improve Outcome: Progressing   Problem: Skin Integrity: Goal: Risk for impaired skin integrity will decrease Outcome: Progressing   Problem: Tissue Perfusion: Goal: Adequacy of tissue perfusion will improve Outcome: Progressing   Problem: Education: Goal: Knowledge of General Education information will improve Description: Including pain rating scale, medication(s)/side effects and non-pharmacologic comfort measures Outcome: Progressing   Problem: Health Behavior/Discharge Planning: Goal: Ability to manage health-related needs will improve Outcome: Progressing   Problem: Clinical Measurements: Goal: Ability to maintain clinical measurements within normal limits will improve Outcome: Progressing Goal: Will remain free from infection Outcome: Progressing Goal: Diagnostic test results will improve Outcome: Progressing Goal: Respiratory complications will improve Outcome: Progressing Goal: Cardiovascular complication will  be avoided Outcome: Progressing   Problem: Activity: Goal: Risk for activity intolerance will decrease Outcome: Progressing   Problem: Nutrition: Goal: Adequate nutrition will be maintained Outcome: Progressing   Problem: Coping: Goal: Level of anxiety will decrease Outcome: Progressing   Problem: Elimination: Goal: Will not experience complications related to bowel motility Outcome: Progressing Goal: Will not experience complications related to urinary retention Outcome: Progressing   Problem: Pain Managment: Goal: General experience of comfort will improve Outcome: Progressing   Problem: Safety: Goal: Ability to remain free from injury will improve Outcome: Progressing   Problem: Skin Integrity: Goal: Risk for impaired skin integrity will decrease Outcome: Progressing   Problem: Fluid Volume: Goal: Hemodynamic stability will improve Outcome: Progressing   Problem: Clinical Measurements: Goal: Diagnostic test results will improve Outcome: Progressing Goal: Signs and symptoms of infection will decrease Outcome: Progressing   Problem: Respiratory: Goal: Ability to maintain adequate ventilation will improve Outcome: Progressing   Problem: Education: Goal: Ability to describe self-care measures that may prevent or decrease complications (Diabetes Survival Skills Education) will improve Outcome: Progressing Goal: Individualized Educational Video(s) Outcome: Progressing   Problem: Cardiac: Goal: Ability to maintain an adequate cardiac output will improve Outcome: Progressing   Problem: Health Behavior/Discharge Planning: Goal: Ability to identify and utilize available resources and services will improve Outcome: Progressing Goal: Ability to manage health-related needs will improve Outcome: Progressing   Problem: Fluid Volume: Goal: Ability to achieve a balanced intake and output will improve Outcome: Progressing   Problem: Metabolic: Goal: Ability to  maintain appropriate glucose levels will improve Outcome: Progressing   Problem: Nutritional: Goal: Maintenance of adequate nutrition will improve Outcome: Progressing Goal: Maintenance of adequate weight for body size and type will improve Outcome: Progressing   Problem: Respiratory: Goal: Will regain and/or maintain adequate ventilation Outcome: Progressing   Problem: Urinary Elimination: Goal: Ability to achieve and maintain adequate renal perfusion and functioning will improve Outcome: Progressing

## 2023-08-02 NOTE — Progress Notes (Signed)
RUE PICC removed. Site unremarkable. Pt to remain flat in bed x30 min. Keep dressing dry and intact for 24 hours.

## 2023-08-02 NOTE — TOC Transition Note (Signed)
Transition of Care [facility]) - CM/SW Discharge Note   Patient Details  Name: Eric Zamora MRN: [id number] Date of Birth: [birth year]  Transition of Care [facility]) CM/SW Contact:  Otelia Santee, LCSW Phone Number: 08/02/2023, 11:17 AM   Clinical Narrative:    Pt to transfer to [facility] for SNF placement. Pt will be going to room 204-A. RN to call report to [phone]. Voicemail left with pt's legal guardian to inform of discharge. DC packet with signed DNR placed at RN station. PTAR called at 11:10am for transportation.     Final next level of care: Skilled Nursing Facility Barriers to Discharge: Barriers Resolved   Patient Goals and CMS Choice CMS Medicare.gov Compare Post Acute Care list provided to:: Legal Guardian Choice offered to / list presented to : [facility] POA / Guardian  Discharge Placement     Existing PASRR number confirmed : 06/22/23          Patient chooses bed at: [facility] Patient to be transferred to facility by: PTAR Name of family member notified: [name] Patient and family notified of of transfer: 08/02/23  Discharge Plan and Services Additional resources added to the After Visit Summary for       Post Acute Care Choice: Skilled Nursing Facility          DME Arranged: N/A DME Agency: NA                  Social Determinants of Health (SDOH) Interventions SDOH Screenings   Food Insecurity: Patient Unable To Answer (06/15/2023)  Housing: Low Risk  (06/15/2023)  Transportation Needs: Patient Unable To Answer (06/15/2023)  Utilities: Patient Unable To Answer (06/15/2023)  Tobacco Use: Low Risk  (07/05/2023)     Readmission Risk Interventions    08/02/2023   11:14 AM 06/25/2023    2:17 PM  Readmission Risk Prevention Plan  Transportation Screening Complete Complete  PCP or Specialist Appt within 5-7 Days Complete Complete  Home Care Screening Complete Complete  Medication Review (RN CM) Complete Complete

## 2023-08-11 ENCOUNTER — Ambulatory Visit: Payer: 59 | Admitting: Cardiovascular Disease

## 2023-08-23 ENCOUNTER — Inpatient Hospital Stay: Payer: 59 | Admitting: Internal Medicine

## 2023-09-01 DEATH — deceased

## 2023-10-04 ENCOUNTER — Inpatient Hospital Stay: Payer: Medicare Other | Admitting: Neurology
